# Patient Record
Sex: Male | Born: 1950 | Race: White | Hispanic: No | Marital: Single | Smoking: Never smoker
Health system: Southern US, Community
[De-identification: ages and names within clinical notes are randomized; demographics above are authoritative.]

## PROBLEM LIST (undated history)

## (undated) DIAGNOSIS — N139 Obstructive and reflux uropathy, unspecified: Secondary | ICD-10-CM

## (undated) DIAGNOSIS — N189 Chronic kidney disease, unspecified: Secondary | ICD-10-CM

## (undated) DIAGNOSIS — I4510 Unspecified right bundle-branch block: Secondary | ICD-10-CM

## (undated) DIAGNOSIS — F200 Paranoid schizophrenia: Secondary | ICD-10-CM

## (undated) DIAGNOSIS — F32A Depression, unspecified: Secondary | ICD-10-CM

## (undated) DIAGNOSIS — E785 Hyperlipidemia, unspecified: Secondary | ICD-10-CM

## (undated) DIAGNOSIS — E66813 Obesity, class 3: Secondary | ICD-10-CM

## (undated) DIAGNOSIS — F419 Anxiety disorder, unspecified: Secondary | ICD-10-CM

## (undated) DIAGNOSIS — I509 Heart failure, unspecified: Secondary | ICD-10-CM

## (undated) DIAGNOSIS — R4189 Other symptoms and signs involving cognitive functions and awareness: Secondary | ICD-10-CM

## (undated) DIAGNOSIS — A419 Sepsis, unspecified organism: Secondary | ICD-10-CM

## (undated) DIAGNOSIS — E039 Hypothyroidism, unspecified: Secondary | ICD-10-CM

## (undated) DIAGNOSIS — R600 Localized edema: Secondary | ICD-10-CM

## (undated) DIAGNOSIS — R531 Weakness: Secondary | ICD-10-CM

## (undated) DIAGNOSIS — J449 Chronic obstructive pulmonary disease, unspecified: Secondary | ICD-10-CM

## (undated) DIAGNOSIS — K219 Gastro-esophageal reflux disease without esophagitis: Secondary | ICD-10-CM

## (undated) DIAGNOSIS — E559 Vitamin D deficiency, unspecified: Secondary | ICD-10-CM

## (undated) DIAGNOSIS — C8590 Non-Hodgkin lymphoma, unspecified, unspecified site: Secondary | ICD-10-CM

## (undated) DIAGNOSIS — C801 Malignant (primary) neoplasm, unspecified: Secondary | ICD-10-CM

## (undated) DIAGNOSIS — K449 Diaphragmatic hernia without obstruction or gangrene: Secondary | ICD-10-CM

## (undated) DIAGNOSIS — C9110 Chronic lymphocytic leukemia of B-cell type not having achieved remission: Secondary | ICD-10-CM

## (undated) DIAGNOSIS — I10 Essential (primary) hypertension: Secondary | ICD-10-CM

## (undated) DIAGNOSIS — R9431 Abnormal electrocardiogram [ECG] [EKG]: Secondary | ICD-10-CM

## (undated) DIAGNOSIS — I517 Cardiomegaly: Secondary | ICD-10-CM

## (undated) DIAGNOSIS — L0390 Cellulitis, unspecified: Secondary | ICD-10-CM

## (undated) DIAGNOSIS — F259 Schizoaffective disorder, unspecified: Secondary | ICD-10-CM

## (undated) HISTORY — PX: LYMPH NODE BIOPSY: SHX201

## (undated) HISTORY — DX: Other symptoms and signs involving cognitive functions and awareness: R41.89

## (undated) HISTORY — DX: Chronic kidney disease, unspecified: N18.9

## (undated) HISTORY — DX: Non-Hodgkin lymphoma, unspecified, unspecified site: C85.90

## (undated) HISTORY — DX: Unspecified right bundle-branch block: I45.10

## (undated) HISTORY — DX: Anxiety disorder, unspecified: F41.9

## (undated) HISTORY — DX: Obstructive and reflux uropathy, unspecified: N13.9

## (undated) HISTORY — DX: Hypothyroidism, unspecified: E03.9

## (undated) HISTORY — DX: Cardiomegaly: I51.7

## (undated) HISTORY — DX: Paranoid schizophrenia: F20.0

## (undated) HISTORY — DX: Gastro-esophageal reflux disease without esophagitis: K21.9

## (undated) HISTORY — DX: Chronic lymphocytic leukemia of B-cell type not having achieved remission: C91.10

## (undated) HISTORY — DX: Diaphragmatic hernia without obstruction or gangrene: K44.9

## (undated) HISTORY — DX: Obesity, class 3: E66.813

## (undated) HISTORY — DX: Weakness: R53.1

## (undated) HISTORY — DX: Morbid (severe) obesity due to excess calories: E66.01

## (undated) HISTORY — DX: Depression, unspecified: F32.A

---

## 2001-02-03 ENCOUNTER — Encounter: Admission: RE | Admit: 2001-02-03 | Discharge: 2001-02-03 | Payer: Self-pay | Admitting: Nephrology

## 2001-02-03 ENCOUNTER — Encounter: Payer: Self-pay | Admitting: Nephrology

## 2001-04-20 ENCOUNTER — Emergency Department (HOSPITAL_COMMUNITY): Admission: EM | Admit: 2001-04-20 | Discharge: 2001-04-20 | Payer: Self-pay | Admitting: *Deleted

## 2001-05-21 ENCOUNTER — Emergency Department (HOSPITAL_COMMUNITY): Admission: EM | Admit: 2001-05-21 | Discharge: 2001-05-21 | Payer: Self-pay | Admitting: Emergency Medicine

## 2001-08-16 ENCOUNTER — Encounter: Admission: RE | Admit: 2001-08-16 | Discharge: 2001-08-16 | Payer: Self-pay | Admitting: Nephrology

## 2001-08-16 ENCOUNTER — Encounter: Payer: Self-pay | Admitting: Nephrology

## 2001-09-18 ENCOUNTER — Ambulatory Visit (HOSPITAL_BASED_OUTPATIENT_CLINIC_OR_DEPARTMENT_OTHER): Admission: RE | Admit: 2001-09-18 | Discharge: 2001-09-18 | Payer: Self-pay | Admitting: Nephrology

## 2001-12-02 ENCOUNTER — Ambulatory Visit (HOSPITAL_BASED_OUTPATIENT_CLINIC_OR_DEPARTMENT_OTHER): Admission: RE | Admit: 2001-12-02 | Discharge: 2001-12-02 | Payer: Self-pay | Admitting: Nephrology

## 2002-03-21 ENCOUNTER — Emergency Department (HOSPITAL_COMMUNITY): Admission: EM | Admit: 2002-03-21 | Discharge: 2002-03-21 | Payer: Self-pay | Admitting: Emergency Medicine

## 2002-03-21 ENCOUNTER — Encounter: Payer: Self-pay | Admitting: Emergency Medicine

## 2002-07-19 ENCOUNTER — Inpatient Hospital Stay (HOSPITAL_COMMUNITY): Admission: EM | Admit: 2002-07-19 | Discharge: 2002-07-27 | Payer: Self-pay | Admitting: Psychiatry

## 2003-02-07 ENCOUNTER — Emergency Department (HOSPITAL_COMMUNITY): Admission: EM | Admit: 2003-02-07 | Discharge: 2003-02-08 | Payer: Self-pay | Admitting: Emergency Medicine

## 2003-02-08 ENCOUNTER — Inpatient Hospital Stay (HOSPITAL_COMMUNITY): Admission: EM | Admit: 2003-02-08 | Discharge: 2003-02-16 | Payer: Self-pay | Admitting: Nephrology

## 2003-07-29 ENCOUNTER — Emergency Department (HOSPITAL_COMMUNITY): Admission: EM | Admit: 2003-07-29 | Discharge: 2003-07-30 | Payer: Self-pay | Admitting: *Deleted

## 2003-08-31 ENCOUNTER — Emergency Department (HOSPITAL_COMMUNITY): Admission: EM | Admit: 2003-08-31 | Discharge: 2003-09-01 | Payer: Self-pay | Admitting: Emergency Medicine

## 2003-10-05 ENCOUNTER — Inpatient Hospital Stay (HOSPITAL_COMMUNITY): Admission: EM | Admit: 2003-10-05 | Discharge: 2003-10-10 | Payer: Self-pay | Admitting: Emergency Medicine

## 2003-10-17 DIAGNOSIS — C9110 Chronic lymphocytic leukemia of B-cell type not having achieved remission: Secondary | ICD-10-CM

## 2003-10-17 HISTORY — DX: Chronic lymphocytic leukemia of B-cell type not having achieved remission: C91.10

## 2005-12-15 ENCOUNTER — Ambulatory Visit: Payer: Self-pay | Admitting: Oncology

## 2005-12-29 ENCOUNTER — Encounter (HOSPITAL_COMMUNITY): Payer: Self-pay | Admitting: Oncology

## 2005-12-29 ENCOUNTER — Other Ambulatory Visit: Admission: RE | Admit: 2005-12-29 | Discharge: 2005-12-29 | Payer: Self-pay | Admitting: Oncology

## 2006-03-29 ENCOUNTER — Ambulatory Visit: Payer: Self-pay | Admitting: Oncology

## 2006-04-05 LAB — CBC WITH DIFFERENTIAL/PLATELET
Basophils Absolute: 0.1 10*3/uL (ref 0.0–0.1)
EOS%: 1 % (ref 0.0–7.0)
Eosinophils Absolute: 0.2 10*3/uL (ref 0.0–0.5)
HCT: 40.4 % (ref 38.7–49.9)
LYMPH%: 61.7 % — ABNORMAL HIGH (ref 14.0–48.0)
MCV: 88.1 fL (ref 81.6–98.0)
MONO%: 3.3 % (ref 0.0–13.0)
NEUT#: 6.3 10*3/uL (ref 1.5–6.5)
NEUT%: 33.3 % — ABNORMAL LOW (ref 40.0–75.0)
Platelets: 159 10*3/uL (ref 145–400)
RBC: 4.58 10*6/uL (ref 4.20–5.71)

## 2006-06-30 ENCOUNTER — Ambulatory Visit: Payer: Self-pay | Admitting: Oncology

## 2006-07-09 ENCOUNTER — Emergency Department (HOSPITAL_COMMUNITY): Admission: EM | Admit: 2006-07-09 | Discharge: 2006-07-09 | Payer: Self-pay | Admitting: Emergency Medicine

## 2006-09-08 ENCOUNTER — Ambulatory Visit: Payer: Self-pay | Admitting: Oncology

## 2006-10-12 ENCOUNTER — Ambulatory Visit (HOSPITAL_COMMUNITY): Admission: RE | Admit: 2006-10-12 | Discharge: 2006-10-12 | Payer: Self-pay | Admitting: Oncology

## 2006-10-12 LAB — CBC WITH DIFFERENTIAL/PLATELET
EOS%: 0.8 % (ref 0.0–7.0)
MCH: 30.4 pg (ref 28.0–33.4)
MCV: 89.6 fL (ref 81.6–98.0)
MONO%: 3.2 % (ref 0.0–13.0)
RBC: 4.34 10*6/uL (ref 4.20–5.71)
RDW: 14.8 % — ABNORMAL HIGH (ref 11.2–14.6)

## 2006-10-12 LAB — COMPREHENSIVE METABOLIC PANEL
AST: 12 U/L (ref 0–37)
Albumin: 4.4 g/dL (ref 3.5–5.2)
Alkaline Phosphatase: 92 U/L (ref 39–117)
BUN: 21 mg/dL (ref 6–23)
Potassium: 4.5 mEq/L (ref 3.5–5.3)
Sodium: 143 mEq/L (ref 135–145)
Total Bilirubin: 0.5 mg/dL (ref 0.3–1.2)
Total Protein: 6.1 g/dL (ref 6.0–8.3)

## 2007-01-06 ENCOUNTER — Ambulatory Visit: Payer: Self-pay | Admitting: Oncology

## 2007-01-11 LAB — CBC WITH DIFFERENTIAL/PLATELET
BASO%: 0.4 % (ref 0.0–2.0)
EOS%: 1.4 % (ref 0.0–7.0)
HCT: 42.1 % (ref 38.7–49.9)
LYMPH%: 58 % — ABNORMAL HIGH (ref 14.0–48.0)
MCH: 29.8 pg (ref 28.0–33.4)
MCHC: 34 g/dL (ref 32.0–35.9)
MCV: 87.8 fL (ref 81.6–98.0)
MONO%: 4.5 % (ref 0.0–13.0)
NEUT%: 35.7 % — ABNORMAL LOW (ref 40.0–75.0)
Platelets: 142 10*3/uL — ABNORMAL LOW (ref 145–400)
RBC: 4.8 10*6/uL (ref 4.20–5.71)
WBC: 19.8 10*3/uL — ABNORMAL HIGH (ref 4.0–10.0)

## 2007-01-11 LAB — COMPREHENSIVE METABOLIC PANEL
ALT: 11 U/L (ref 0–53)
AST: 13 U/L (ref 0–37)
Albumin: 4.7 g/dL (ref 3.5–5.2)
Chloride: 108 mEq/L (ref 96–112)
Creatinine, Ser: 1.67 mg/dL — ABNORMAL HIGH (ref 0.40–1.50)
Total Protein: 6.8 g/dL (ref 6.0–8.3)

## 2007-01-11 LAB — LACTATE DEHYDROGENASE: LDH: 129 U/L (ref 94–250)

## 2007-01-26 ENCOUNTER — Ambulatory Visit (HOSPITAL_COMMUNITY): Admission: RE | Admit: 2007-01-26 | Discharge: 2007-01-26 | Payer: Self-pay | Admitting: Oncology

## 2007-02-10 LAB — CBC WITH DIFFERENTIAL/PLATELET
BASO%: 0.6 % (ref 0.0–2.0)
Eosinophils Absolute: 0.2 10*3/uL (ref 0.0–0.5)
MCHC: 34.4 g/dL (ref 32.0–35.9)
MONO#: 0.5 10*3/uL (ref 0.1–0.9)
NEUT#: 5.6 10*3/uL (ref 1.5–6.5)
Platelets: 133 10*3/uL — ABNORMAL LOW (ref 145–400)
RBC: 4.38 10*6/uL (ref 4.20–5.71)
WBC: 15.8 10*3/uL — ABNORMAL HIGH (ref 4.0–10.0)
lymph#: 9.4 10*3/uL — ABNORMAL HIGH (ref 0.9–3.3)

## 2007-02-10 LAB — COMPREHENSIVE METABOLIC PANEL
ALT: 9 U/L (ref 0–53)
Albumin: 4.4 g/dL (ref 3.5–5.2)
CO2: 24 mEq/L (ref 19–32)
Calcium: 9.6 mg/dL (ref 8.4–10.5)
Chloride: 110 mEq/L (ref 96–112)
Glucose, Bld: 87 mg/dL (ref 70–99)
Potassium: 4.2 mEq/L (ref 3.5–5.3)
Sodium: 141 mEq/L (ref 135–145)
Total Bilirubin: 0.5 mg/dL (ref 0.3–1.2)
Total Protein: 6.2 g/dL (ref 6.0–8.3)

## 2007-02-10 LAB — LACTATE DEHYDROGENASE: LDH: 136 U/L (ref 94–250)

## 2007-04-04 ENCOUNTER — Emergency Department (HOSPITAL_COMMUNITY): Admission: EM | Admit: 2007-04-04 | Discharge: 2007-04-04 | Payer: Self-pay | Admitting: Emergency Medicine

## 2007-04-06 ENCOUNTER — Ambulatory Visit: Payer: Self-pay | Admitting: Oncology

## 2007-04-08 LAB — CBC WITH DIFFERENTIAL/PLATELET
BASO%: 0.3 % (ref 0.0–2.0)
HCT: 35.5 % — ABNORMAL LOW (ref 38.7–49.9)
LYMPH%: 63.2 % — ABNORMAL HIGH (ref 14.0–48.0)
MCH: 29.2 pg (ref 28.0–33.4)
MCHC: 34.3 g/dL (ref 32.0–35.9)
MCV: 85.3 fL (ref 81.6–98.0)
MONO%: 3.6 % (ref 0.0–13.0)
NEUT%: 31.3 % — ABNORMAL LOW (ref 40.0–75.0)
Platelets: 132 10*3/uL — ABNORMAL LOW (ref 145–400)
RBC: 4.17 10*6/uL — ABNORMAL LOW (ref 4.20–5.71)

## 2007-04-08 LAB — LACTATE DEHYDROGENASE: LDH: 105 U/L (ref 94–250)

## 2007-04-08 LAB — COMPREHENSIVE METABOLIC PANEL
ALT: 10 U/L (ref 0–53)
Alkaline Phosphatase: 72 U/L (ref 39–117)
CO2: 24 mEq/L (ref 19–32)
Creatinine, Ser: 1.33 mg/dL (ref 0.40–1.50)
Sodium: 143 mEq/L (ref 135–145)
Total Bilirubin: 0.4 mg/dL (ref 0.3–1.2)
Total Protein: 5.7 g/dL — ABNORMAL LOW (ref 6.0–8.3)

## 2007-04-08 LAB — IRON AND TIBC
%SAT: 17 % — ABNORMAL LOW (ref 20–55)
Iron: 49 ug/dL (ref 42–165)
TIBC: 291 ug/dL (ref 215–435)

## 2007-04-08 LAB — FERRITIN: Ferritin: 31 ng/mL (ref 22–322)

## 2007-06-07 ENCOUNTER — Ambulatory Visit: Payer: Self-pay | Admitting: Oncology

## 2007-06-10 LAB — CBC WITH DIFFERENTIAL/PLATELET
BASO%: 0.3 % (ref 0.0–2.0)
EOS%: 0.9 % (ref 0.0–7.0)
HGB: 12.7 g/dL — ABNORMAL LOW (ref 13.0–17.1)
MCH: 30.1 pg (ref 28.0–33.4)
MCV: 87 fL (ref 81.6–98.0)
MONO%: 3.6 % (ref 0.0–13.0)
RBC: 4.22 10*6/uL (ref 4.20–5.71)
RDW: 16.1 % — ABNORMAL HIGH (ref 11.2–14.6)
lymph#: 12.8 10*3/uL — ABNORMAL HIGH (ref 0.9–3.3)

## 2007-06-10 LAB — COMPREHENSIVE METABOLIC PANEL
Albumin: 4.2 g/dL (ref 3.5–5.2)
BUN: 17 mg/dL (ref 6–23)
CO2: 24 mEq/L (ref 19–32)
Calcium: 9.7 mg/dL (ref 8.4–10.5)
Chloride: 111 mEq/L (ref 96–112)
Glucose, Bld: 77 mg/dL (ref 70–99)
Potassium: 4.5 mEq/L (ref 3.5–5.3)
Sodium: 142 mEq/L (ref 135–145)
Total Protein: 5.8 g/dL — ABNORMAL LOW (ref 6.0–8.3)

## 2007-06-10 LAB — LACTATE DEHYDROGENASE: LDH: 126 U/L (ref 94–250)

## 2007-09-06 ENCOUNTER — Ambulatory Visit: Payer: Self-pay | Admitting: Oncology

## 2007-09-08 LAB — CBC WITH DIFFERENTIAL/PLATELET
BASO%: 0.4 % (ref 0.0–2.0)
Eosinophils Absolute: 0.2 10*3/uL (ref 0.0–0.5)
LYMPH%: 71.2 % — ABNORMAL HIGH (ref 14.0–48.0)
MCHC: 34.5 g/dL (ref 32.0–35.9)
MONO#: 0.6 10*3/uL (ref 0.1–0.9)
NEUT#: 4.9 10*3/uL (ref 1.5–6.5)
Platelets: 120 10*3/uL — ABNORMAL LOW (ref 145–400)
RBC: 4.28 10*6/uL (ref 4.20–5.71)
RDW: 14.7 % — ABNORMAL HIGH (ref 11.2–14.6)
WBC: 20.1 10*3/uL — ABNORMAL HIGH (ref 4.0–10.0)
lymph#: 14.3 10*3/uL — ABNORMAL HIGH (ref 0.9–3.3)

## 2007-09-08 LAB — COMPREHENSIVE METABOLIC PANEL
ALT: 8 U/L (ref 0–53)
Albumin: 4.6 g/dL (ref 3.5–5.2)
CO2: 25 mEq/L (ref 19–32)
Potassium: 4.6 mEq/L (ref 3.5–5.3)
Sodium: 140 mEq/L (ref 135–145)
Total Bilirubin: 0.2 mg/dL — ABNORMAL LOW (ref 0.3–1.2)
Total Protein: 6.3 g/dL (ref 6.0–8.3)

## 2007-09-08 LAB — LACTATE DEHYDROGENASE: LDH: 132 U/L (ref 94–250)

## 2007-12-08 ENCOUNTER — Ambulatory Visit: Payer: Self-pay | Admitting: Oncology

## 2007-12-08 LAB — IGG, IGA, IGM
IgA: 42 mg/dL — ABNORMAL LOW (ref 68–378)
IgG (Immunoglobin G), Serum: 464 mg/dL — ABNORMAL LOW (ref 694–1618)
IgM, Serum: <4 mg/dL — ABNORMAL LOW (ref 60–263)

## 2007-12-08 LAB — COMPREHENSIVE METABOLIC PANEL
ALT: 14 U/L (ref 0–53)
BUN: 17 mg/dL (ref 6–23)
CO2: 21 mEq/L (ref 19–32)
Calcium: 9.1 mg/dL (ref 8.4–10.5)
Chloride: 108 mEq/L (ref 96–112)
Creatinine, Ser: 1.54 mg/dL — ABNORMAL HIGH (ref 0.40–1.50)
Total Bilirubin: 0.5 mg/dL (ref 0.3–1.2)

## 2007-12-08 LAB — CBC WITH DIFFERENTIAL/PLATELET
BASO%: 0.3 % (ref 0.0–2.0)
Basophils Absolute: 0 10*3/uL (ref 0.0–0.1)
EOS%: 0.7 % (ref 0.0–7.0)
HCT: 35.9 % — ABNORMAL LOW (ref 38.7–49.9)
HGB: 12.3 g/dL — ABNORMAL LOW (ref 13.0–17.1)
LYMPH%: 72.3 % — ABNORMAL HIGH (ref 14.0–48.0)
MCH: 29.8 pg (ref 28.0–33.4)
MCHC: 34.3 g/dL (ref 32.0–35.9)
MONO#: 0.6 10*3/uL (ref 0.1–0.9)
NEUT%: 23 % — ABNORMAL LOW (ref 40.0–75.0)
Platelets: 107 10*3/uL — ABNORMAL LOW (ref 145–400)
lymph#: 11.9 10*3/uL — ABNORMAL HIGH (ref 0.9–3.3)

## 2007-12-08 LAB — LACTATE DEHYDROGENASE: LDH: 158 U/L (ref 94–250)

## 2008-04-04 ENCOUNTER — Ambulatory Visit: Payer: Self-pay | Admitting: Oncology

## 2008-08-09 ENCOUNTER — Ambulatory Visit: Payer: Self-pay | Admitting: Oncology

## 2008-08-13 LAB — CBC WITH DIFFERENTIAL/PLATELET
BASO%: 0.2 % (ref 0.0–2.0)
Basophils Absolute: 0.1 10*3/uL (ref 0.0–0.1)
EOS%: 0.7 % (ref 0.0–7.0)
HCT: 34.1 % — ABNORMAL LOW (ref 38.7–49.9)
LYMPH%: 72.1 % — ABNORMAL HIGH (ref 14.0–48.0)
MCH: 27.9 pg — ABNORMAL LOW (ref 28.0–33.4)
MCHC: 33.7 g/dL (ref 32.0–35.9)
MCV: 82.7 fL (ref 81.6–98.0)
MONO%: 4.2 % (ref 0.0–13.0)
NEUT%: 22.8 % — ABNORMAL LOW (ref 40.0–75.0)
Platelets: 116 10*3/uL — ABNORMAL LOW (ref 145–400)
lymph#: 16.4 10*3/uL — ABNORMAL HIGH (ref 0.9–3.3)

## 2008-08-13 LAB — COMPREHENSIVE METABOLIC PANEL
ALT: <8 U/L (ref 0–53)
AST: 16 U/L (ref 0–37)
Alkaline Phosphatase: 103 U/L (ref 39–117)
BUN: 15 mg/dL (ref 6–23)
Creatinine, Ser: 1.42 mg/dL (ref 0.40–1.50)
Total Bilirubin: 0.4 mg/dL (ref 0.3–1.2)

## 2008-08-16 DIAGNOSIS — C8590 Non-Hodgkin lymphoma, unspecified, unspecified site: Secondary | ICD-10-CM

## 2008-08-16 HISTORY — DX: Non-Hodgkin lymphoma, unspecified, unspecified site: C85.90

## 2008-08-17 ENCOUNTER — Emergency Department (HOSPITAL_COMMUNITY): Admission: EM | Admit: 2008-08-17 | Discharge: 2008-08-17 | Payer: Self-pay | Admitting: Emergency Medicine

## 2008-08-20 LAB — CBC WITH DIFFERENTIAL/PLATELET
Basophils Absolute: 0 10*3/uL (ref 0.0–0.1)
EOS%: 1 % (ref 0.0–7.0)
Eosinophils Absolute: 0.2 10*3/uL (ref 0.0–0.5)
HGB: 11.4 g/dL — ABNORMAL LOW (ref 13.0–17.1)
MCH: 27.6 pg — ABNORMAL LOW (ref 28.0–33.4)
MONO#: 1 10*3/uL — ABNORMAL HIGH (ref 0.1–0.9)
NEUT#: 6.4 10*3/uL (ref 1.5–6.5)
RDW: 16.1 % — ABNORMAL HIGH (ref 11.2–14.6)
WBC: 18.3 10*3/uL — ABNORMAL HIGH (ref 4.0–10.0)
lymph#: 10.7 10*3/uL — ABNORMAL HIGH (ref 0.9–3.3)

## 2008-08-20 LAB — COMPREHENSIVE METABOLIC PANEL
AST: 11 U/L (ref 0–37)
Albumin: 3.8 g/dL (ref 3.5–5.2)
BUN: 18 mg/dL (ref 6–23)
CO2: 24 mEq/L (ref 19–32)
Calcium: 9.3 mg/dL (ref 8.4–10.5)
Chloride: 104 mEq/L (ref 96–112)
Potassium: 3.9 mEq/L (ref 3.5–5.3)

## 2008-08-21 ENCOUNTER — Ambulatory Visit: Payer: Self-pay | Admitting: Oncology

## 2008-08-21 ENCOUNTER — Ambulatory Visit: Payer: Self-pay | Admitting: Cardiology

## 2008-08-21 ENCOUNTER — Inpatient Hospital Stay (HOSPITAL_COMMUNITY): Admission: EM | Admit: 2008-08-21 | Discharge: 2008-08-25 | Payer: Self-pay | Admitting: Emergency Medicine

## 2008-08-22 ENCOUNTER — Encounter (INDEPENDENT_AMBULATORY_CARE_PROVIDER_SITE_OTHER): Payer: Self-pay | Admitting: Emergency Medicine

## 2008-08-22 ENCOUNTER — Encounter (INDEPENDENT_AMBULATORY_CARE_PROVIDER_SITE_OTHER): Payer: Self-pay | Admitting: Internal Medicine

## 2008-08-22 ENCOUNTER — Encounter (INDEPENDENT_AMBULATORY_CARE_PROVIDER_SITE_OTHER): Payer: Self-pay | Admitting: Interventional Radiology

## 2008-08-30 LAB — CBC WITH DIFFERENTIAL/PLATELET
Basophils Absolute: 0 10*3/uL (ref 0.0–0.1)
Eosinophils Absolute: 0.1 10*3/uL (ref 0.0–0.5)
HCT: 35.5 % — ABNORMAL LOW (ref 38.7–49.9)
HGB: 11.9 g/dL — ABNORMAL LOW (ref 13.0–17.1)
LYMPH%: 46.2 % (ref 14.0–48.0)
MCV: 83.6 fL (ref 81.6–98.0)
MONO#: 0.2 10*3/uL (ref 0.1–0.9)
MONO%: 1.4 % (ref 0.0–13.0)
NEUT#: 9 10*3/uL — ABNORMAL HIGH (ref 1.5–6.5)
Platelets: 227 10*3/uL (ref 145–400)
RBC: 4.25 10*6/uL (ref 4.20–5.71)
WBC: 17.4 10*3/uL — ABNORMAL HIGH (ref 4.0–10.0)

## 2008-09-06 LAB — CBC WITH DIFFERENTIAL/PLATELET
BASO%: 0.2 % (ref 0.0–2.0)
Eosinophils Absolute: 0.1 10*3/uL (ref 0.0–0.5)
HCT: 35 % — ABNORMAL LOW (ref 38.7–49.9)
LYMPH%: 17.1 % (ref 14.0–48.0)
MCHC: 34.2 g/dL (ref 32.0–35.9)
MCV: 84.2 fL (ref 81.6–98.0)
MONO#: 0.3 10*3/uL (ref 0.1–0.9)
MONO%: 2.7 % (ref 0.0–13.0)
NEUT%: 79.2 % — ABNORMAL HIGH (ref 40.0–75.0)
Platelets: 103 10*3/uL — ABNORMAL LOW (ref 145–400)
WBC: 10.9 10*3/uL — ABNORMAL HIGH (ref 4.0–10.0)

## 2008-09-10 LAB — CBC WITH DIFFERENTIAL/PLATELET
BASO%: 0.4 % (ref 0.0–2.0)
EOS%: 0.3 % (ref 0.0–7.0)
HCT: 36.6 % — ABNORMAL LOW (ref 38.7–49.9)
LYMPH%: 23.2 % (ref 14.0–48.0)
MCH: 28.3 pg (ref 28.0–33.4)
MCHC: 33.6 g/dL (ref 32.0–35.9)
MONO%: 2.1 % (ref 0.0–13.0)
NEUT%: 74 % (ref 40.0–75.0)
Platelets: 92 10*3/uL — ABNORMAL LOW (ref 145–400)
RBC: 4.34 10*6/uL (ref 4.20–5.71)

## 2008-09-10 LAB — URIC ACID: Uric Acid, Serum: 6.5 mg/dL (ref 4.0–7.8)

## 2008-09-13 ENCOUNTER — Emergency Department (HOSPITAL_COMMUNITY): Admission: EM | Admit: 2008-09-13 | Discharge: 2008-09-14 | Payer: Self-pay | Admitting: Emergency Medicine

## 2008-09-18 LAB — COMPREHENSIVE METABOLIC PANEL
Albumin: 4.3 g/dL (ref 3.5–5.2)
CO2: 20 mEq/L (ref 19–32)
Calcium: 9.8 mg/dL (ref 8.4–10.5)
Glucose, Bld: 94 mg/dL (ref 70–99)
Potassium: 4 mEq/L (ref 3.5–5.3)
Sodium: 137 mEq/L (ref 135–145)
Total Protein: 6 g/dL (ref 6.0–8.3)

## 2008-09-18 LAB — CBC WITH DIFFERENTIAL/PLATELET
Basophils Absolute: 0.1 10*3/uL (ref 0.0–0.1)
Eosinophils Absolute: 0.1 10*3/uL (ref 0.0–0.5)
HGB: 12.2 g/dL — ABNORMAL LOW (ref 13.0–17.1)
MONO#: 0.6 10*3/uL (ref 0.1–0.9)
NEUT#: 8.5 10*3/uL — ABNORMAL HIGH (ref 1.5–6.5)
Platelets: 137 10*3/uL — ABNORMAL LOW (ref 145–400)
RBC: 4.18 10*6/uL — ABNORMAL LOW (ref 4.20–5.71)
RDW: 23 % — ABNORMAL HIGH (ref 11.2–14.6)
WBC: 12.5 10*3/uL — ABNORMAL HIGH (ref 4.0–10.0)

## 2008-09-18 LAB — LACTATE DEHYDROGENASE: LDH: 181 U/L (ref 94–250)

## 2008-09-20 ENCOUNTER — Ambulatory Visit (HOSPITAL_BASED_OUTPATIENT_CLINIC_OR_DEPARTMENT_OTHER): Admission: RE | Admit: 2008-09-20 | Discharge: 2008-09-20 | Payer: Self-pay | Admitting: General Surgery

## 2008-09-20 ENCOUNTER — Encounter (INDEPENDENT_AMBULATORY_CARE_PROVIDER_SITE_OTHER): Payer: Self-pay | Admitting: General Surgery

## 2008-09-20 ENCOUNTER — Encounter (INDEPENDENT_AMBULATORY_CARE_PROVIDER_SITE_OTHER): Payer: Self-pay | Admitting: Interventional Radiology

## 2008-09-20 DIAGNOSIS — N189 Chronic kidney disease, unspecified: Secondary | ICD-10-CM

## 2008-09-20 HISTORY — DX: Chronic kidney disease, unspecified: N18.9

## 2008-09-28 ENCOUNTER — Ambulatory Visit: Payer: Self-pay | Admitting: Oncology

## 2008-10-02 ENCOUNTER — Ambulatory Visit: Payer: Self-pay | Admitting: Vascular Surgery

## 2008-10-02 ENCOUNTER — Encounter (HOSPITAL_COMMUNITY): Payer: Self-pay | Admitting: Oncology

## 2008-10-02 ENCOUNTER — Ambulatory Visit: Admission: RE | Admit: 2008-10-02 | Discharge: 2008-10-02 | Payer: Self-pay | Admitting: Oncology

## 2008-10-02 LAB — COMPREHENSIVE METABOLIC PANEL
BUN: 21 mg/dL (ref 6–23)
CO2: 22 mEq/L (ref 19–32)
Glucose, Bld: 92 mg/dL (ref 70–99)
Sodium: 138 mEq/L (ref 135–145)
Total Bilirubin: 0.3 mg/dL (ref 0.3–1.2)
Total Protein: 5.4 g/dL — ABNORMAL LOW (ref 6.0–8.3)

## 2008-10-02 LAB — CBC WITH DIFFERENTIAL/PLATELET
Basophils Absolute: 0.1 10*3/uL (ref 0.0–0.1)
Eosinophils Absolute: 0.7 10*3/uL — ABNORMAL HIGH (ref 0.0–0.5)
HCT: 36.1 % — ABNORMAL LOW (ref 38.7–49.9)
HGB: 12.2 g/dL — ABNORMAL LOW (ref 13.0–17.1)
LYMPH%: 24.8 % (ref 14.0–48.0)
MCV: 84.7 fL (ref 81.6–98.0)
MONO#: 0.6 10*3/uL (ref 0.1–0.9)
NEUT#: 8 10*3/uL — ABNORMAL HIGH (ref 1.5–6.5)
NEUT%: 63.8 % (ref 40.0–75.0)
Platelets: 132 10*3/uL — ABNORMAL LOW (ref 145–400)
RBC: 4.26 10*6/uL (ref 4.20–5.71)
WBC: 12.6 10*3/uL — ABNORMAL HIGH (ref 4.0–10.0)

## 2008-10-02 LAB — URIC ACID: Uric Acid, Serum: 7.7 mg/dL (ref 4.0–7.8)

## 2008-10-02 LAB — LACTATE DEHYDROGENASE: LDH: 154 U/L (ref 94–250)

## 2008-10-04 ENCOUNTER — Ambulatory Visit (HOSPITAL_COMMUNITY): Admission: RE | Admit: 2008-10-04 | Discharge: 2008-10-04 | Payer: Self-pay | Admitting: Oncology

## 2008-10-04 ENCOUNTER — Encounter (HOSPITAL_COMMUNITY): Payer: Self-pay | Admitting: Oncology

## 2008-10-09 LAB — COMPREHENSIVE METABOLIC PANEL
Alkaline Phosphatase: 67 U/L (ref 39–117)
BUN: 20 mg/dL (ref 6–23)
CO2: 23 mEq/L (ref 19–32)
Creatinine, Ser: 1.22 mg/dL (ref 0.40–1.50)
Glucose, Bld: 80 mg/dL (ref 70–99)
Total Bilirubin: 0.5 mg/dL (ref 0.3–1.2)

## 2008-10-09 LAB — URIC ACID: Uric Acid, Serum: 8.4 mg/dL — ABNORMAL HIGH (ref 4.0–7.8)

## 2008-10-09 LAB — CBC WITH DIFFERENTIAL/PLATELET
BASO%: 1.4 % (ref 0.0–2.0)
Basophils Absolute: 0.1 10*3/uL (ref 0.0–0.1)
EOS%: 3 % (ref 0.0–7.0)
HGB: 11.9 g/dL — ABNORMAL LOW (ref 13.0–17.1)
MCH: 29.1 pg (ref 28.0–33.4)
MCHC: 33.8 g/dL (ref 32.0–35.9)
RDW: 19 % — ABNORMAL HIGH (ref 11.2–14.6)
WBC: 9.2 10*3/uL (ref 4.0–10.0)
lymph#: 4.6 10*3/uL — ABNORMAL HIGH (ref 0.9–3.3)

## 2008-10-09 LAB — LACTATE DEHYDROGENASE: LDH: 122 U/L (ref 94–250)

## 2008-10-16 LAB — CBC WITH DIFFERENTIAL/PLATELET
BASO%: 0.7 % (ref 0.0–2.0)
EOS%: 2.7 % (ref 0.0–7.0)
HCT: 35.2 % — ABNORMAL LOW (ref 38.7–49.9)
LYMPH%: 19.4 % (ref 14.0–48.0)
MCH: 28.9 pg (ref 28.0–33.4)
MCHC: 33.6 g/dL (ref 32.0–35.9)
MCV: 86 fL (ref 81.6–98.0)
MONO%: 13.1 % — ABNORMAL HIGH (ref 0.0–13.0)
NEUT%: 64.1 % (ref 40.0–75.0)
Platelets: 125 10*3/uL — ABNORMAL LOW (ref 145–400)
RBC: 4.09 10*6/uL — ABNORMAL LOW (ref 4.20–5.71)
WBC: 8.1 10*3/uL (ref 4.0–10.0)

## 2008-10-16 LAB — COMPREHENSIVE METABOLIC PANEL
ALT: <8 U/L (ref 0–53)
Alkaline Phosphatase: 48 U/L (ref 39–117)
Creatinine, Ser: 1.17 mg/dL (ref 0.40–1.50)
Sodium: 141 mEq/L (ref 135–145)
Total Bilirubin: 0.5 mg/dL (ref 0.3–1.2)
Total Protein: 5.3 g/dL — ABNORMAL LOW (ref 6.0–8.3)

## 2008-10-16 LAB — LACTATE DEHYDROGENASE: LDH: 174 U/L (ref 94–250)

## 2008-10-23 LAB — CBC WITH DIFFERENTIAL/PLATELET
Basophils Absolute: 0 10*3/uL (ref 0.0–0.1)
EOS%: 2.4 % (ref 0.0–7.0)
Eosinophils Absolute: 0.1 10*3/uL (ref 0.0–0.5)
LYMPH%: 30.8 % (ref 14.0–48.0)
MCH: 29.4 pg (ref 28.0–33.4)
MCV: 86.4 fL (ref 81.6–98.0)
MONO%: 5 % (ref 0.0–13.0)
NEUT#: 3.1 10*3/uL (ref 1.5–6.5)
Platelets: 120 10*3/uL — ABNORMAL LOW (ref 145–400)
RBC: 3.67 10*6/uL — ABNORMAL LOW (ref 4.20–5.71)

## 2008-10-31 LAB — COMPREHENSIVE METABOLIC PANEL
ALT: 13 U/L (ref 0–53)
AST: 13 U/L (ref 0–37)
Alkaline Phosphatase: 75 U/L (ref 39–117)
Sodium: 139 mEq/L (ref 135–145)
Total Bilirubin: 0.4 mg/dL (ref 0.3–1.2)
Total Protein: 6.3 g/dL (ref 6.0–8.3)

## 2008-10-31 LAB — CBC WITH DIFFERENTIAL/PLATELET
BASO%: 0.9 % (ref 0.0–2.0)
LYMPH%: 13.5 % — ABNORMAL LOW (ref 14.0–48.0)
MCHC: 35 g/dL (ref 32.0–35.9)
MCV: 82.9 fL (ref 81.6–98.0)
MONO%: 4.7 % (ref 0.0–13.0)
Platelets: 128 10*3/uL — ABNORMAL LOW (ref 145–400)
RBC: 4.11 10*6/uL — ABNORMAL LOW (ref 4.20–5.71)
RDW: 16.8 % — ABNORMAL HIGH (ref 11.2–14.6)
WBC: 7.7 10*3/uL (ref 4.0–10.0)

## 2008-10-31 LAB — URIC ACID: Uric Acid, Serum: 7.4 mg/dL (ref 4.0–7.8)

## 2008-11-06 LAB — CBC WITH DIFFERENTIAL/PLATELET
EOS%: 0.6 % (ref 0.0–7.0)
Eosinophils Absolute: 0 10*3/uL (ref 0.0–0.5)
MCV: 85.8 fL (ref 81.6–98.0)
MONO%: 3 % (ref 0.0–13.0)
NEUT#: 5.8 10*3/uL (ref 1.5–6.5)
RBC: 3.47 10*6/uL — ABNORMAL LOW (ref 4.20–5.71)
RDW: 18.3 % — ABNORMAL HIGH (ref 11.2–14.6)
WBC: 6.9 10*3/uL (ref 4.0–10.0)
lymph#: 0.8 10*3/uL — ABNORMAL LOW (ref 0.9–3.3)

## 2008-11-14 ENCOUNTER — Ambulatory Visit: Payer: Self-pay | Admitting: Oncology

## 2008-11-19 LAB — COMPREHENSIVE METABOLIC PANEL
ALT: 9 U/L (ref 0–53)
AST: 11 U/L (ref 0–37)
Alkaline Phosphatase: 68 U/L (ref 39–117)
Creatinine, Ser: 1.16 mg/dL (ref 0.40–1.50)
Sodium: 139 mEq/L (ref 135–145)
Total Bilirubin: 0.3 mg/dL (ref 0.3–1.2)
Total Protein: 6 g/dL (ref 6.0–8.3)

## 2008-11-19 LAB — CBC WITH DIFFERENTIAL/PLATELET
BASO%: 0.3 % (ref 0.0–2.0)
EOS%: 0.7 % (ref 0.0–7.0)
HCT: 31.5 % — ABNORMAL LOW (ref 38.7–49.9)
LYMPH%: 9.7 % — ABNORMAL LOW (ref 14.0–48.0)
MCH: 28.9 pg (ref 28.0–33.4)
MCHC: 34.6 g/dL (ref 32.0–35.9)
MONO%: 4.3 % (ref 0.0–13.0)
NEUT%: 85 % — ABNORMAL HIGH (ref 40.0–75.0)
Platelets: 124 10*3/uL — ABNORMAL LOW (ref 145–400)
RBC: 3.77 10*6/uL — ABNORMAL LOW (ref 4.20–5.71)
WBC: 7.5 10*3/uL (ref 4.0–10.0)

## 2008-11-19 LAB — LACTATE DEHYDROGENASE: LDH: 182 U/L (ref 94–250)

## 2008-11-26 LAB — CBC WITH DIFFERENTIAL/PLATELET
Basophils Absolute: 0 10*3/uL (ref 0.0–0.1)
Eosinophils Absolute: 0.2 10*3/uL (ref 0.0–0.5)
HGB: 9.8 g/dL — ABNORMAL LOW (ref 13.0–17.1)
MCV: 83.7 fL (ref 81.6–98.0)
MONO#: 0.2 10*3/uL (ref 0.1–0.9)
MONO%: 7.6 % (ref 0.0–13.0)
NEUT#: 2.2 10*3/uL (ref 1.5–6.5)
Platelets: 128 10*3/uL — ABNORMAL LOW (ref 145–400)
RDW: 17.5 % — ABNORMAL HIGH (ref 11.2–14.6)

## 2008-12-10 ENCOUNTER — Ambulatory Visit (HOSPITAL_COMMUNITY): Admission: RE | Admit: 2008-12-10 | Discharge: 2008-12-10 | Payer: Self-pay | Admitting: Gastroenterology

## 2008-12-10 LAB — CBC WITH DIFFERENTIAL/PLATELET
Basophils Absolute: 0 10*3/uL (ref 0.0–0.1)
Eosinophils Absolute: 0.1 10*3/uL (ref 0.0–0.5)
HCT: 29.5 % — ABNORMAL LOW (ref 38.7–49.9)
LYMPH%: 15.4 % (ref 14.0–48.0)
MCV: 83.3 fL (ref 81.6–98.0)
MONO%: 7.7 % (ref 0.0–13.0)
NEUT#: 1.7 10*3/uL (ref 1.5–6.5)
NEUT%: 73.7 % (ref 40.0–75.0)
Platelets: 143 10*3/uL — ABNORMAL LOW (ref 145–400)
RBC: 3.54 10*6/uL — ABNORMAL LOW (ref 4.20–5.71)

## 2008-12-17 LAB — CBC WITH DIFFERENTIAL/PLATELET
BASO%: 0.7 % (ref 0.0–2.0)
Basophils Absolute: 0 10*3/uL (ref 0.0–0.1)
EOS%: 0.8 % (ref 0.0–7.0)
HCT: 30.6 % — ABNORMAL LOW (ref 38.7–49.9)
HGB: 10.6 g/dL — ABNORMAL LOW (ref 13.0–17.1)
MCH: 29.1 pg (ref 28.0–33.4)
MCHC: 34.6 g/dL (ref 32.0–35.9)
MCV: 84.1 fL (ref 81.6–98.0)
MONO%: 8.4 % (ref 0.0–13.0)
NEUT%: 83.5 % — ABNORMAL HIGH (ref 40.0–75.0)
RDW: 18.7 % — ABNORMAL HIGH (ref 11.2–14.6)
lymph#: 0.4 10*3/uL — ABNORMAL LOW (ref 0.9–3.3)

## 2008-12-18 ENCOUNTER — Ambulatory Visit: Payer: Self-pay | Admitting: Oncology

## 2008-12-18 ENCOUNTER — Inpatient Hospital Stay (HOSPITAL_COMMUNITY): Admission: EM | Admit: 2008-12-18 | Discharge: 2008-12-25 | Payer: Self-pay | Admitting: Emergency Medicine

## 2008-12-24 ENCOUNTER — Ambulatory Visit: Payer: Self-pay | Admitting: Oncology

## 2008-12-25 ENCOUNTER — Ambulatory Visit: Payer: Self-pay | Admitting: Psychiatry

## 2008-12-25 ENCOUNTER — Inpatient Hospital Stay (HOSPITAL_COMMUNITY): Admission: RE | Admit: 2008-12-25 | Discharge: 2008-12-28 | Payer: Self-pay | Admitting: Psychiatry

## 2009-01-03 ENCOUNTER — Ambulatory Visit (HOSPITAL_COMMUNITY): Admission: RE | Admit: 2009-01-03 | Discharge: 2009-01-03 | Payer: Self-pay | Admitting: Oncology

## 2009-01-03 LAB — COMPREHENSIVE METABOLIC PANEL
AST: 11 U/L (ref 0–37)
Albumin: 4.7 g/dL (ref 3.5–5.2)
Alkaline Phosphatase: 57 U/L (ref 39–117)
Potassium: 4.3 mEq/L (ref 3.5–5.3)
Sodium: 140 mEq/L (ref 135–145)
Total Bilirubin: 0.5 mg/dL (ref 0.3–1.2)
Total Protein: 6.3 g/dL (ref 6.0–8.3)

## 2009-01-03 LAB — CBC WITH DIFFERENTIAL/PLATELET
Basophils Absolute: 0.1 10*3/uL (ref 0.0–0.1)
Eosinophils Absolute: 0.9 10*3/uL — ABNORMAL HIGH (ref 0.0–0.5)
HGB: 11.9 g/dL — ABNORMAL LOW (ref 13.0–17.1)
LYMPH%: 7.8 % — ABNORMAL LOW (ref 14.0–48.0)
MCH: 28.7 pg (ref 28.0–33.4)
MCV: 84.4 fL (ref 81.6–98.0)
MONO#: 0.5 10*3/uL (ref 0.1–0.9)
NEUT%: 75 % (ref 40.0–75.0)

## 2009-01-11 LAB — CBC WITH DIFFERENTIAL/PLATELET
Eosinophils Absolute: 0.9 10*3/uL — ABNORMAL HIGH (ref 0.0–0.5)
HCT: 31.1 % — ABNORMAL LOW (ref 38.4–49.9)
LYMPH%: 20.1 % (ref 14.0–49.0)
MCHC: 34.5 g/dL (ref 32.0–36.0)
MCV: 84.2 fL (ref 79.3–98.0)
MONO#: 0.2 10*3/uL (ref 0.1–0.9)
MONO%: 9.2 % (ref 0.0–14.0)
NEUT#: 0.7 10*3/uL — ABNORMAL LOW (ref 1.5–6.5)
NEUT%: 32.3 % — ABNORMAL LOW (ref 39.0–75.0)
Platelets: 104 10*3/uL — ABNORMAL LOW (ref 140–400)
RBC: 3.7 10*6/uL — ABNORMAL LOW (ref 4.20–5.82)
WBC: 2.3 10*3/uL — ABNORMAL LOW (ref 4.0–10.3)

## 2009-01-17 LAB — CBC WITH DIFFERENTIAL/PLATELET
BASO%: 0.1 % (ref 0.0–2.0)
LYMPH%: 4.3 % — ABNORMAL LOW (ref 14.0–49.0)
MCHC: 34.5 g/dL (ref 32.0–36.0)
MCV: 85 fL (ref 79.3–98.0)
MONO#: 0.5 10*3/uL (ref 0.1–0.9)
MONO%: 4.9 % (ref 0.0–14.0)
Platelets: 89 10*3/uL — ABNORMAL LOW (ref 140–400)
RBC: 3.82 10*6/uL — ABNORMAL LOW (ref 4.20–5.82)
WBC: 10.8 10*3/uL — ABNORMAL HIGH (ref 4.0–10.3)

## 2009-01-21 LAB — CBC WITH DIFFERENTIAL/PLATELET
BASO%: 0.5 % (ref 0.0–2.0)
Eosinophils Absolute: 0.1 10*3/uL (ref 0.0–0.5)
HCT: 35 % — ABNORMAL LOW (ref 38.4–49.9)
MCHC: 34 g/dL (ref 32.0–36.0)
MONO#: 0.4 10*3/uL (ref 0.1–0.9)
NEUT#: 10.8 10*3/uL — ABNORMAL HIGH (ref 1.5–6.5)
NEUT%: 90.4 % — ABNORMAL HIGH (ref 39.0–75.0)
WBC: 12 10*3/uL — ABNORMAL HIGH (ref 4.0–10.3)
lymph#: 0.6 10*3/uL — ABNORMAL LOW (ref 0.9–3.3)
nRBC: 0 % (ref 0–0)

## 2009-01-21 LAB — COMPREHENSIVE METABOLIC PANEL
Albumin: 4.7 g/dL (ref 3.5–5.2)
Alkaline Phosphatase: 73 U/L (ref 39–117)
BUN: 16 mg/dL (ref 6–23)
Creatinine, Ser: 1.21 mg/dL (ref 0.40–1.50)
Glucose, Bld: 95 mg/dL (ref 70–99)
Total Bilirubin: 0.4 mg/dL (ref 0.3–1.2)

## 2009-01-21 LAB — URIC ACID: Uric Acid, Serum: 5.1 mg/dL (ref 4.0–7.8)

## 2009-02-05 ENCOUNTER — Ambulatory Visit: Payer: Self-pay | Admitting: Oncology

## 2009-02-07 LAB — CBC WITH DIFFERENTIAL/PLATELET
Basophils Absolute: 0.1 10*3/uL (ref 0.0–0.1)
Eosinophils Absolute: 0 10*3/uL (ref 0.0–0.5)
HGB: 11.7 g/dL — ABNORMAL LOW (ref 13.0–17.1)
MCV: 85.5 fL (ref 79.3–98.0)
NEUT#: 11.6 10*3/uL — ABNORMAL HIGH (ref 1.5–6.5)
RDW: 18.7 % — ABNORMAL HIGH (ref 11.0–14.6)
lymph#: 0.6 10*3/uL — ABNORMAL LOW (ref 0.9–3.3)

## 2009-02-07 LAB — COMPREHENSIVE METABOLIC PANEL
Albumin: 4.9 g/dL (ref 3.5–5.2)
CO2: 27 mEq/L (ref 19–32)
Glucose, Bld: 102 mg/dL — ABNORMAL HIGH (ref 70–99)
Potassium: 4.5 mEq/L (ref 3.5–5.3)
Sodium: 141 mEq/L (ref 135–145)
Total Protein: 7 g/dL (ref 6.0–8.3)

## 2009-02-07 LAB — URIC ACID: Uric Acid, Serum: 5.7 mg/dL (ref 4.0–7.8)

## 2009-02-07 LAB — LACTATE DEHYDROGENASE: LDH: 322 U/L — ABNORMAL HIGH (ref 94–250)

## 2009-02-27 ENCOUNTER — Ambulatory Visit: Payer: Self-pay | Admitting: Psychiatry

## 2009-03-05 LAB — CBC WITH DIFFERENTIAL/PLATELET
Basophils Absolute: 0 10*3/uL (ref 0.0–0.1)
EOS%: 1.5 % (ref 0.0–7.0)
Eosinophils Absolute: 0.1 10*3/uL (ref 0.0–0.5)
HCT: 31.5 % — ABNORMAL LOW (ref 38.4–49.9)
HGB: 10.8 g/dL — ABNORMAL LOW (ref 13.0–17.1)
MCH: 30.1 pg (ref 27.2–33.4)
MONO#: 0.6 10*3/uL (ref 0.1–0.9)
NEUT#: 7.6 10*3/uL — ABNORMAL HIGH (ref 1.5–6.5)
RDW: 21 % — ABNORMAL HIGH (ref 11.0–14.6)
WBC: 8.9 10*3/uL (ref 4.0–10.3)
lymph#: 0.5 10*3/uL — ABNORMAL LOW (ref 0.9–3.3)

## 2009-03-05 LAB — COMPREHENSIVE METABOLIC PANEL
AST: 15 U/L (ref 0–37)
Albumin: 4.3 g/dL (ref 3.5–5.2)
BUN: 18 mg/dL (ref 6–23)
CO2: 26 mEq/L (ref 19–32)
Calcium: 9.6 mg/dL (ref 8.4–10.5)
Chloride: 103 mEq/L (ref 96–112)
Potassium: 4.1 mEq/L (ref 3.5–5.3)

## 2009-03-05 LAB — LACTATE DEHYDROGENASE: LDH: 180 U/L (ref 94–250)

## 2009-03-17 ENCOUNTER — Other Ambulatory Visit: Payer: Self-pay | Admitting: Emergency Medicine

## 2009-03-17 ENCOUNTER — Ambulatory Visit: Payer: Self-pay | Admitting: Vascular Surgery

## 2009-03-17 ENCOUNTER — Encounter (INDEPENDENT_AMBULATORY_CARE_PROVIDER_SITE_OTHER): Payer: Self-pay | Admitting: Emergency Medicine

## 2009-03-18 ENCOUNTER — Ambulatory Visit: Payer: Self-pay | Admitting: Psychiatry

## 2009-03-18 ENCOUNTER — Inpatient Hospital Stay (HOSPITAL_COMMUNITY): Admission: RE | Admit: 2009-03-18 | Discharge: 2009-03-26 | Payer: Self-pay | Admitting: Psychiatry

## 2009-04-11 ENCOUNTER — Inpatient Hospital Stay (HOSPITAL_COMMUNITY): Admission: EM | Admit: 2009-04-11 | Discharge: 2009-04-22 | Payer: Self-pay | Admitting: Emergency Medicine

## 2009-04-12 ENCOUNTER — Ambulatory Visit: Payer: Self-pay | Admitting: Oncology

## 2009-04-13 ENCOUNTER — Ambulatory Visit: Payer: Self-pay | Admitting: *Deleted

## 2009-04-13 ENCOUNTER — Encounter (INDEPENDENT_AMBULATORY_CARE_PROVIDER_SITE_OTHER): Payer: Self-pay | Admitting: Internal Medicine

## 2009-04-19 ENCOUNTER — Encounter (INDEPENDENT_AMBULATORY_CARE_PROVIDER_SITE_OTHER): Payer: Self-pay | Admitting: Internal Medicine

## 2009-04-19 ENCOUNTER — Ambulatory Visit: Payer: Self-pay | Admitting: Vascular Surgery

## 2009-05-07 ENCOUNTER — Ambulatory Visit: Payer: Self-pay | Admitting: Oncology

## 2009-05-09 LAB — COMPREHENSIVE METABOLIC PANEL
Alkaline Phosphatase: 83 U/L (ref 39–117)
BUN: 23 mg/dL (ref 6–23)
Glucose, Bld: 84 mg/dL (ref 70–99)
Sodium: 139 mEq/L (ref 135–145)
Total Bilirubin: 0.3 mg/dL (ref 0.3–1.2)
Total Protein: 6.3 g/dL (ref 6.0–8.3)

## 2009-05-09 LAB — CBC WITH DIFFERENTIAL/PLATELET
Eosinophils Absolute: 0.5 10*3/uL (ref 0.0–0.5)
LYMPH%: 13.3 % — ABNORMAL LOW (ref 14.0–49.0)
MCH: 28.3 pg (ref 27.2–33.4)
MCV: 88.1 fL (ref 79.3–98.0)
MONO%: 12.7 % (ref 0.0–14.0)
NEUT#: 3.1 10*3/uL (ref 1.5–6.5)
Platelets: 118 10*3/uL — ABNORMAL LOW (ref 140–400)
RBC: 4.38 10*6/uL (ref 4.20–5.82)

## 2009-05-09 LAB — URIC ACID: Uric Acid, Serum: 6.7 mg/dL (ref 4.0–7.8)

## 2009-07-04 ENCOUNTER — Ambulatory Visit: Payer: Self-pay | Admitting: Oncology

## 2009-07-08 LAB — COMPREHENSIVE METABOLIC PANEL
ALT: 27 U/L (ref 0–53)
AST: 16 U/L (ref 0–37)
Alkaline Phosphatase: 92 U/L (ref 39–117)
Glucose, Bld: 116 mg/dL — ABNORMAL HIGH (ref 70–99)
Sodium: 140 mEq/L (ref 135–145)
Total Bilirubin: 0.3 mg/dL (ref 0.3–1.2)
Total Protein: 5.8 g/dL — ABNORMAL LOW (ref 6.0–8.3)

## 2009-07-08 LAB — CBC WITH DIFFERENTIAL/PLATELET
BASO%: 0.3 % (ref 0.0–2.0)
EOS%: 2.1 % (ref 0.0–7.0)
MCH: 30.5 pg (ref 27.2–33.4)
MCV: 88 fL (ref 79.3–98.0)
MONO%: 5.3 % (ref 0.0–14.0)
RBC: 4.07 10*6/uL — ABNORMAL LOW (ref 4.20–5.82)
RDW: 15.7 % — ABNORMAL HIGH (ref 11.0–14.6)
lymph#: 0.6 10*3/uL — ABNORMAL LOW (ref 0.9–3.3)

## 2009-07-08 LAB — LACTATE DEHYDROGENASE: LDH: 116 U/L (ref 94–250)

## 2009-09-05 ENCOUNTER — Ambulatory Visit: Payer: Self-pay | Admitting: Oncology

## 2009-09-27 ENCOUNTER — Emergency Department (HOSPITAL_COMMUNITY): Admission: EM | Admit: 2009-09-27 | Discharge: 2009-09-27 | Payer: Self-pay | Admitting: Emergency Medicine

## 2009-10-11 ENCOUNTER — Inpatient Hospital Stay (HOSPITAL_COMMUNITY): Admission: EM | Admit: 2009-10-11 | Discharge: 2009-10-15 | Payer: Self-pay | Admitting: Emergency Medicine

## 2009-10-15 ENCOUNTER — Ambulatory Visit: Payer: Self-pay | Admitting: Psychiatry

## 2009-10-15 ENCOUNTER — Inpatient Hospital Stay (HOSPITAL_COMMUNITY): Admission: AD | Admit: 2009-10-15 | Discharge: 2009-10-23 | Payer: Self-pay | Admitting: Psychiatry

## 2009-10-31 ENCOUNTER — Ambulatory Visit: Payer: Self-pay | Admitting: Oncology

## 2009-11-04 LAB — CBC WITH DIFFERENTIAL/PLATELET
BASO%: 0.5 % (ref 0.0–2.0)
Basophils Absolute: 0 10*3/uL (ref 0.0–0.1)
EOS%: 2.4 % (ref 0.0–7.0)
Eosinophils Absolute: 0.1 10*3/uL (ref 0.0–0.5)
HCT: 38 % — ABNORMAL LOW (ref 38.4–49.9)
HGB: 13.1 g/dL (ref 13.0–17.1)
LYMPH%: 14.5 % (ref 14.0–49.0)
MCH: 31.8 pg (ref 27.2–33.4)
MCHC: 34.4 g/dL (ref 32.0–36.0)
MCV: 92.3 fL (ref 79.3–98.0)
MONO#: 0.4 10*3/uL (ref 0.1–0.9)
MONO%: 8.6 % (ref 0.0–14.0)
NEUT#: 3.4 10*3/uL (ref 1.5–6.5)
NEUT%: 74 % (ref 39.0–75.0)
Platelets: 78 10*3/uL — ABNORMAL LOW (ref 140–400)
RBC: 4.12 10*6/uL — ABNORMAL LOW (ref 4.20–5.82)
RDW: 14.4 % (ref 11.0–14.6)
WBC: 4.6 10*3/uL (ref 4.0–10.3)
lymph#: 0.7 10*3/uL — ABNORMAL LOW (ref 0.9–3.3)

## 2009-11-04 LAB — COMPREHENSIVE METABOLIC PANEL
ALT: 17 U/L (ref 0–53)
AST: 15 U/L (ref 0–37)
Albumin: 4.4 g/dL (ref 3.5–5.2)
BUN: 19 mg/dL (ref 6–23)
CO2: 27 mEq/L (ref 19–32)
Calcium: 9.4 mg/dL (ref 8.4–10.5)
Chloride: 104 mEq/L (ref 96–112)
Potassium: 4.1 mEq/L (ref 3.5–5.3)

## 2009-11-04 LAB — LACTATE DEHYDROGENASE: LDH: 148 U/L (ref 94–250)

## 2009-11-07 ENCOUNTER — Other Ambulatory Visit: Payer: Self-pay

## 2009-11-07 ENCOUNTER — Other Ambulatory Visit: Payer: Self-pay | Admitting: Emergency Medicine

## 2009-11-11 ENCOUNTER — Other Ambulatory Visit: Payer: Self-pay | Admitting: Psychiatry

## 2009-11-12 ENCOUNTER — Inpatient Hospital Stay (HOSPITAL_COMMUNITY): Admission: AD | Admit: 2009-11-12 | Discharge: 2009-12-19 | Payer: Self-pay | Admitting: Psychiatry

## 2009-12-06 ENCOUNTER — Ambulatory Visit: Payer: Self-pay | Admitting: Oncology

## 2009-12-17 LAB — CBC WITH DIFFERENTIAL/PLATELET
BASO%: 0.2 % (ref 0.0–2.0)
EOS%: 3.8 % (ref 0.0–7.0)
HCT: 38.8 % (ref 38.4–49.9)
MCH: 31.9 pg (ref 27.2–33.4)
MCHC: 34.6 g/dL (ref 32.0–36.0)
MONO#: 0.5 10*3/uL (ref 0.1–0.9)
NEUT%: 77.9 % — ABNORMAL HIGH (ref 39.0–75.0)
RBC: 4.22 10*6/uL (ref 4.20–5.82)
WBC: 7.6 10*3/uL (ref 4.0–10.3)
lymph#: 0.8 10*3/uL — ABNORMAL LOW (ref 0.9–3.3)

## 2009-12-17 LAB — COMPREHENSIVE METABOLIC PANEL
ALT: 18 U/L (ref 0–53)
AST: 14 U/L (ref 0–37)
CO2: 26 mEq/L (ref 19–32)
Calcium: 9.1 mg/dL (ref 8.4–10.5)
Chloride: 103 mEq/L (ref 96–112)
Creatinine, Ser: 1.2 mg/dL (ref 0.40–1.50)
Sodium: 137 mEq/L (ref 135–145)
Total Bilirubin: 0.3 mg/dL (ref 0.3–1.2)
Total Protein: 5.9 g/dL — ABNORMAL LOW (ref 6.0–8.3)

## 2010-01-30 IMAGING — CT CT HEAD W/O CM
1 series · 16 of 30 positions shown, 20 images · non-contrast
Comparison: 08/21/2008

CLINICAL DATA: Altered level of consciousness.

CT HEAD WITHOUT CONTRAST
TECHNIQUE: Contiguous axial images were obtained from the base of
the skull through the vertex without contrast.

[Series 2: head routine 4.8 h37s · axial · 0.46mm/px · z∈[+1296,+1433]mm · 16 of 30 slices shown, 20 images]
[im 2/30  brain]
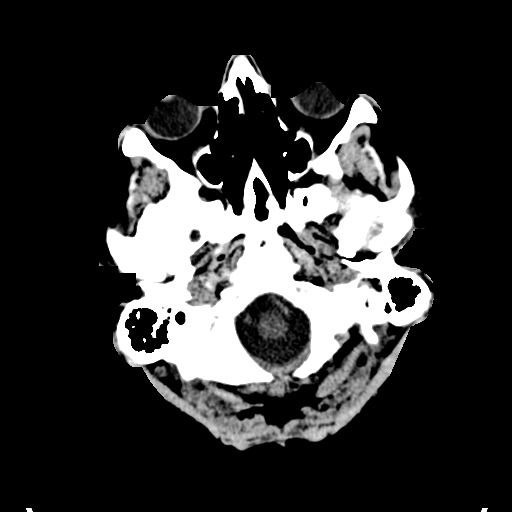
[im 2/30  bone]
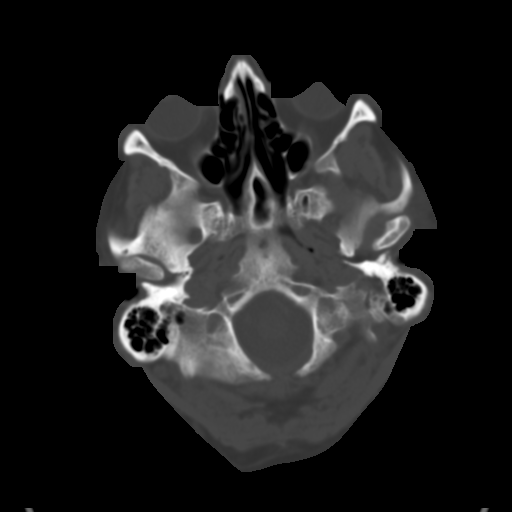
[im 4/30  brain]
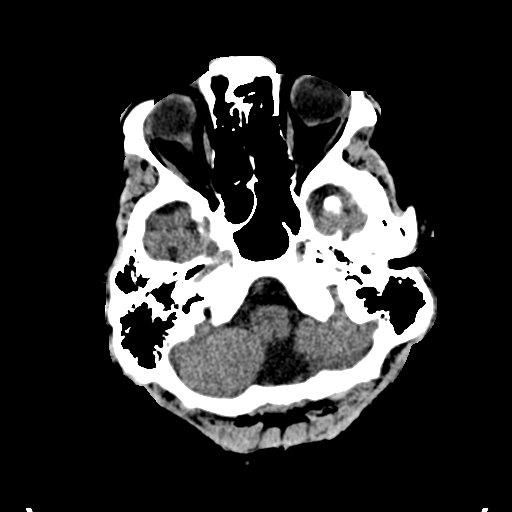
[im 6/30  brain]
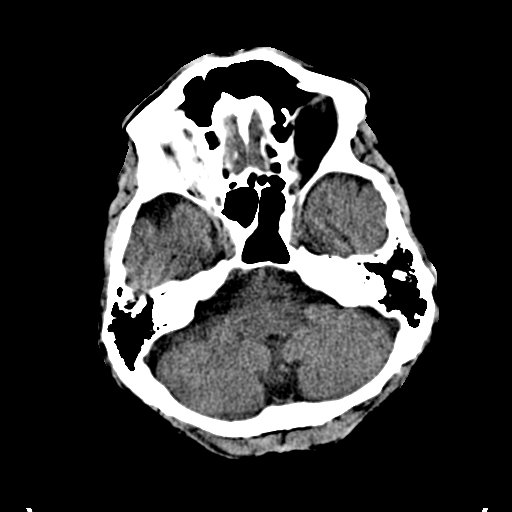
[im 8/30  brain]
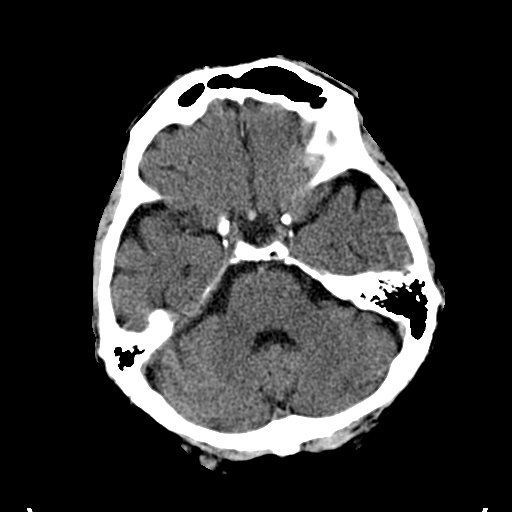
[im 9/30  brain]
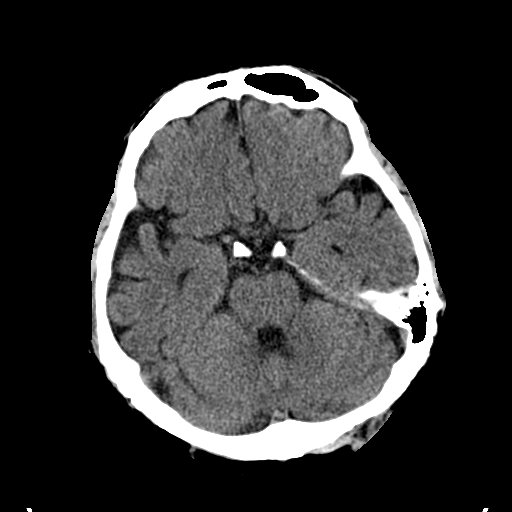
[im 9/30  bone]
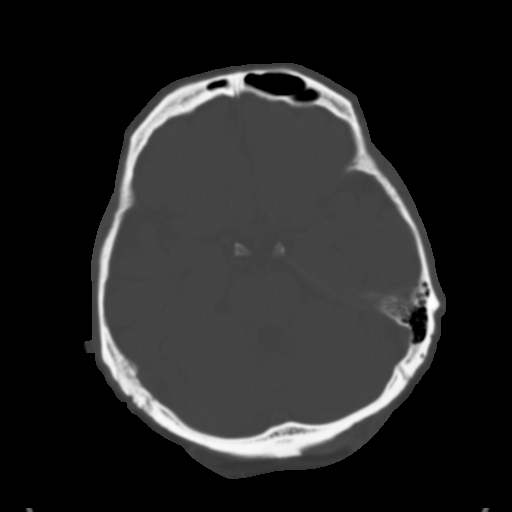
[im 11/30  brain]
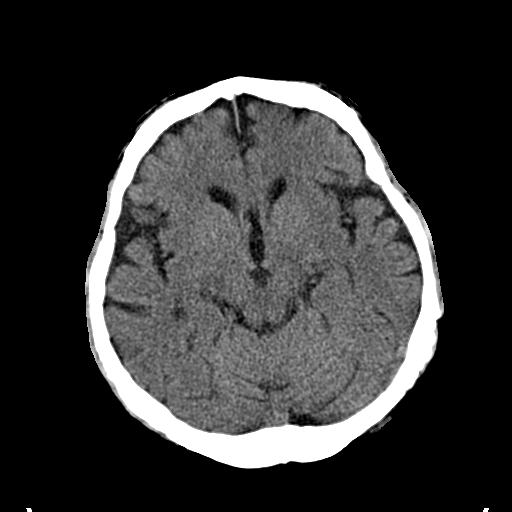
[im 13/30  brain]
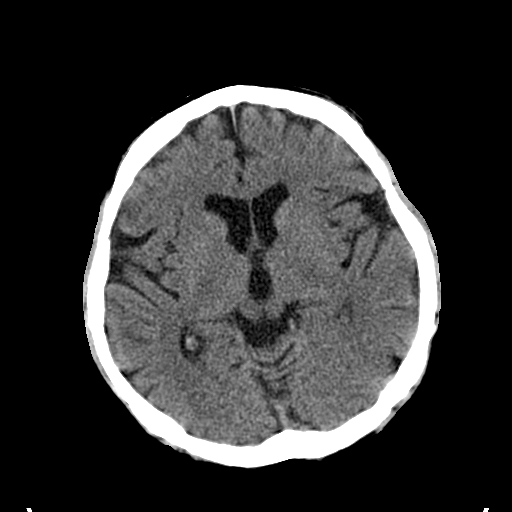
[im 15/30  brain]
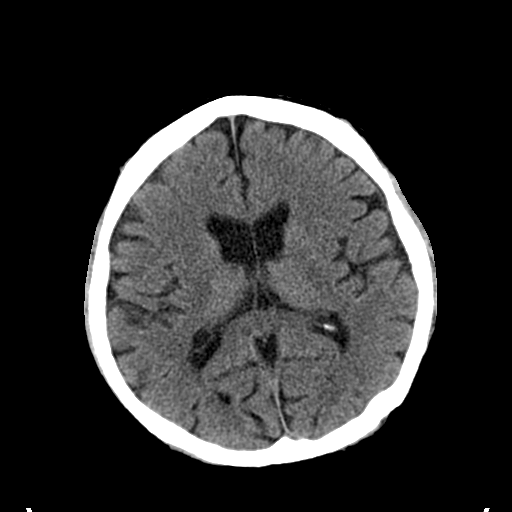
[im 16/30  brain]
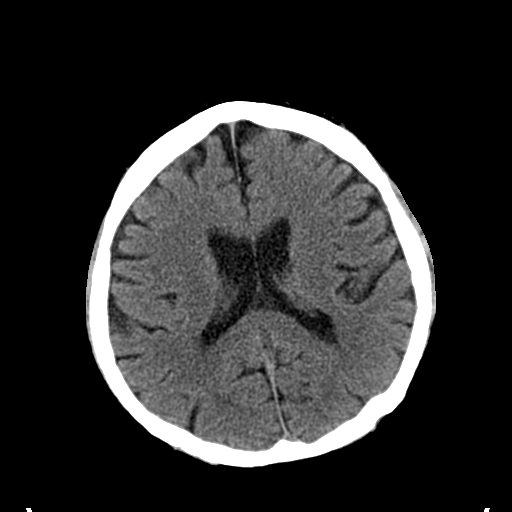
[im 16/30  bone]
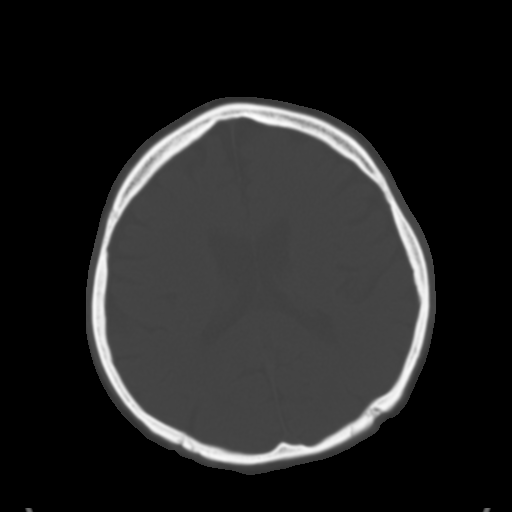
[im 18/30  brain]
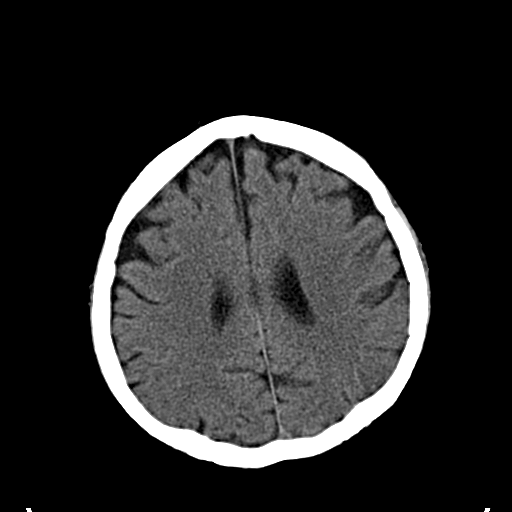
[im 20/30  brain]
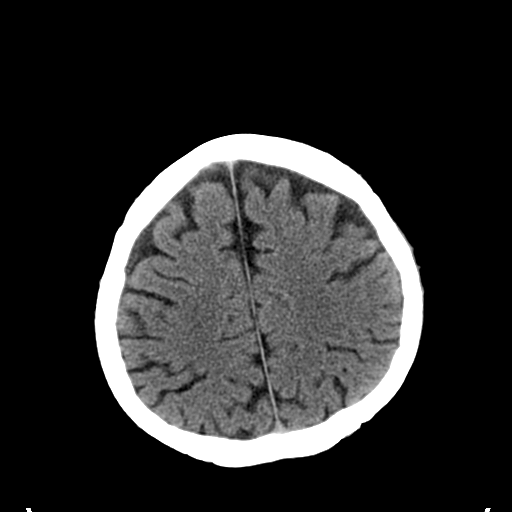
[im 22/30  brain]
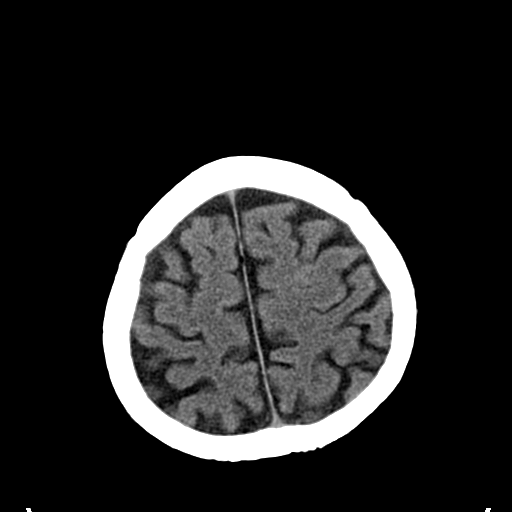
[im 23/30  brain]
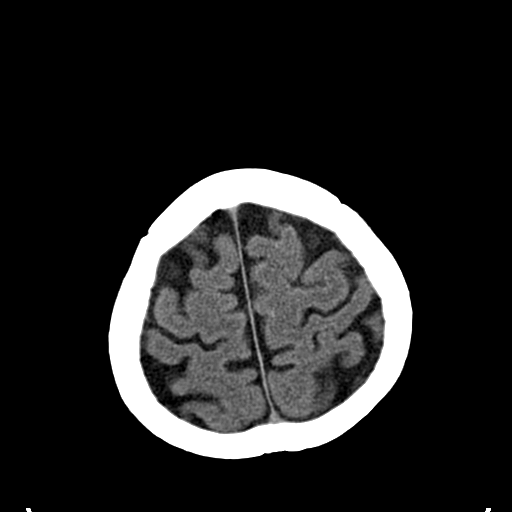
[im 23/30  bone]
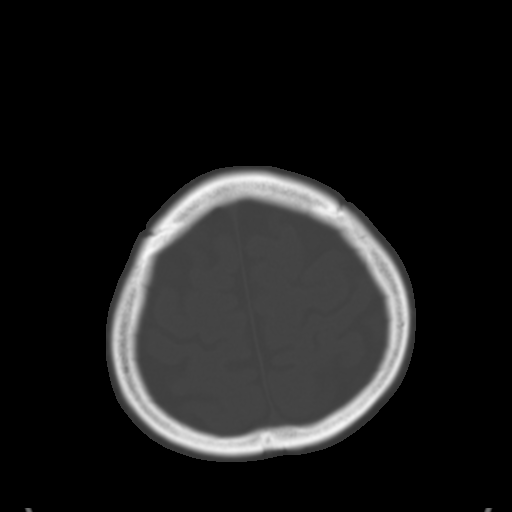
[im 25/30  brain]
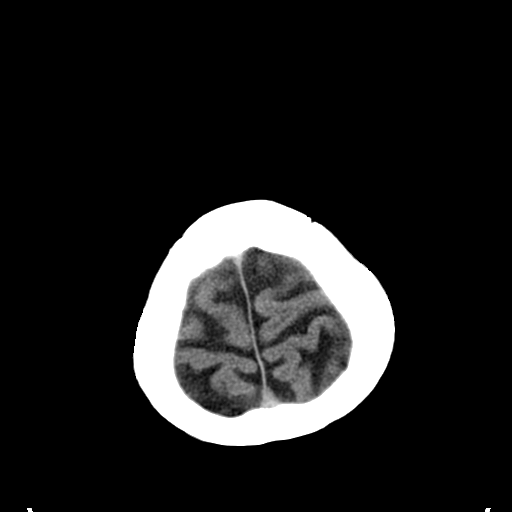
[im 27/30  brain]
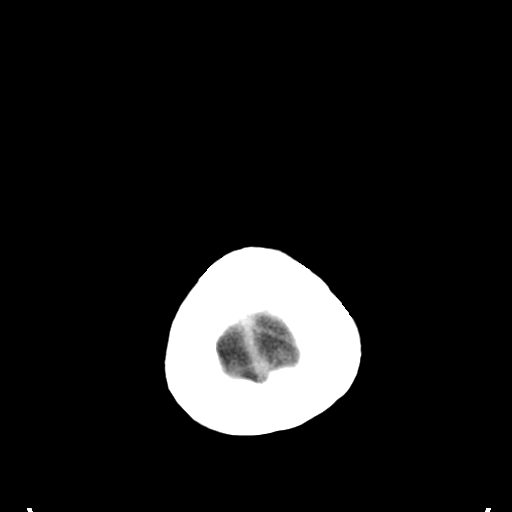
[im 29/30  brain]
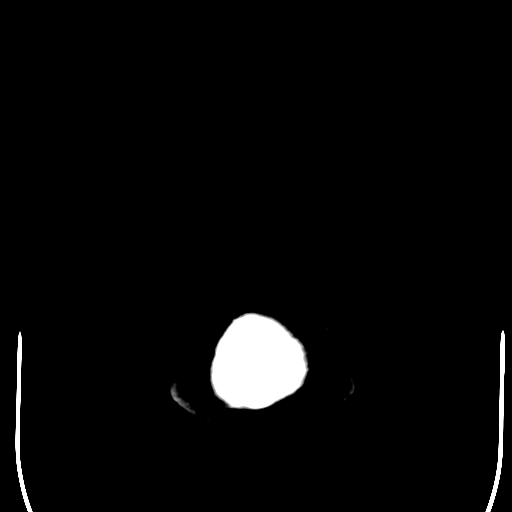

[16 of 30 positions shown; findings below may reference images not displayed]

FINDINGS: No mass lesion, mass effect, midline shift,
hydrocephalus, or hemorrhage.  Shotty posterior scalp lymph nodes
are present, extending into the upper neck, which has been seen on
prior exam of 08/21/2008.

Intracranially, no mass lesion, mass effect, midline shift,
hydrocephalus, or hemorrhage.  No territorial ischemia/infarct.
Gray white differentiation is preserved.  Bone windows appear
within normal limits.
IMPRESSION: 1.  No acute intracranial abnormality.
2.  Posterior inferior scalp and upper posterior cervical shotty
lymph nodes.  This can be associated with scalp infection.

## 2010-02-12 ENCOUNTER — Ambulatory Visit: Payer: Self-pay | Admitting: Oncology

## 2010-03-17 ENCOUNTER — Ambulatory Visit: Payer: Self-pay | Admitting: Oncology

## 2010-07-19 ENCOUNTER — Emergency Department (HOSPITAL_COMMUNITY)
Admission: EM | Admit: 2010-07-19 | Discharge: 2010-07-21 | Payer: Self-pay | Source: Home / Self Care | Admitting: Emergency Medicine

## 2010-08-05 ENCOUNTER — Ambulatory Visit: Payer: Self-pay | Admitting: Oncology

## 2010-08-07 LAB — COMPREHENSIVE METABOLIC PANEL
ALT: 12 U/L (ref 0–53)
Albumin: 4.4 g/dL (ref 3.5–5.2)
CO2: 26 mEq/L (ref 19–32)
Calcium: 9.9 mg/dL (ref 8.4–10.5)
Chloride: 110 mEq/L (ref 96–112)
Glucose, Bld: 114 mg/dL — ABNORMAL HIGH (ref 70–99)
Potassium: 4.2 mEq/L (ref 3.5–5.3)
Sodium: 144 mEq/L (ref 135–145)
Total Protein: 6 g/dL (ref 6.0–8.3)

## 2010-08-07 LAB — CBC WITH DIFFERENTIAL/PLATELET
BASO%: 0.3 % (ref 0.0–2.0)
Basophils Absolute: 0 10*3/uL (ref 0.0–0.1)
EOS%: 1.7 % (ref 0.0–7.0)
Eosinophils Absolute: 0.2 10*3/uL (ref 0.0–0.5)
HGB: 13.3 g/dL (ref 13.0–17.1)
LYMPH%: 7.6 % — ABNORMAL LOW (ref 14.0–49.0)
MCV: 91.1 fL (ref 79.3–98.0)
MONO#: 0.5 10*3/uL (ref 0.1–0.9)
NEUT#: 8.8 10*3/uL — ABNORMAL HIGH (ref 1.5–6.5)
Platelets: 155 10*3/uL (ref 140–400)
RBC: 4.5 10*6/uL (ref 4.20–5.82)
WBC: 10.2 10*3/uL (ref 4.0–10.3)

## 2010-08-07 LAB — LACTATE DEHYDROGENASE: LDH: 112 U/L (ref 94–250)

## 2010-09-16 ENCOUNTER — Emergency Department (HOSPITAL_COMMUNITY): Admission: EM | Admit: 2010-09-16 | Discharge: 2010-09-17 | Payer: Self-pay | Admitting: Emergency Medicine

## 2010-12-05 ENCOUNTER — Ambulatory Visit: Payer: Self-pay | Admitting: Oncology

## 2010-12-07 ENCOUNTER — Encounter: Payer: Self-pay | Admitting: Internal Medicine

## 2010-12-07 ENCOUNTER — Encounter (HOSPITAL_COMMUNITY): Payer: Self-pay | Admitting: Oncology

## 2010-12-09 ENCOUNTER — Other Ambulatory Visit (HOSPITAL_COMMUNITY): Payer: Self-pay | Admitting: Oncology

## 2010-12-09 DIAGNOSIS — C8590 Non-Hodgkin lymphoma, unspecified, unspecified site: Secondary | ICD-10-CM

## 2010-12-09 LAB — CBC WITH DIFFERENTIAL/PLATELET
BASO%: 0.2 % (ref 0.0–2.0)
Basophils Absolute: 0 10*3/uL (ref 0.0–0.1)
HCT: 42.5 % (ref 38.4–49.9)
MCH: 30.3 pg (ref 27.2–33.4)
MCHC: 33.8 g/dL (ref 32.0–36.0)
MCV: 89.5 fL (ref 79.3–98.0)
MONO%: 4.2 % (ref 0.0–14.0)
NEUT#: 7 10*3/uL — ABNORMAL HIGH (ref 1.5–6.5)
NEUT%: 82.8 % — ABNORMAL HIGH (ref 39.0–75.0)
RBC: 4.75 10*6/uL (ref 4.20–5.82)
RDW: 14.4 % (ref 11.0–14.6)
WBC: 8.4 10*3/uL (ref 4.0–10.3)

## 2010-12-09 LAB — COMPREHENSIVE METABOLIC PANEL
ALT: 16 U/L (ref 0–53)
Alkaline Phosphatase: 69 U/L (ref 39–117)
BUN: 18 mg/dL (ref 6–23)
Glucose, Bld: 109 mg/dL — ABNORMAL HIGH (ref 70–99)
Potassium: 4.3 mEq/L (ref 3.5–5.3)
Total Protein: 6.7 g/dL (ref 6.0–8.3)

## 2011-01-27 LAB — DIFFERENTIAL
Basophils Absolute: 0 10*3/uL (ref 0.0–0.1)
Basophils Relative: 0 % (ref 0–1)
Eosinophils Absolute: 0.3 10*3/uL (ref 0.0–0.7)
Eosinophils Relative: 2 % (ref 0–5)
Lymphocytes Relative: 12 % (ref 12–46)
Monocytes Absolute: 0.6 10*3/uL (ref 0.1–1.0)

## 2011-01-27 LAB — COMPREHENSIVE METABOLIC PANEL
AST: 21 U/L (ref 0–37)
BUN: 14 mg/dL (ref 6–23)
CO2: 27 mEq/L (ref 19–32)
Chloride: 111 mEq/L (ref 96–112)
Creatinine, Ser: 1.41 mg/dL (ref 0.4–1.5)
GFR calc non Af Amer: 52 mL/min — ABNORMAL LOW (ref >60)
Glucose, Bld: 128 mg/dL — ABNORMAL HIGH (ref 70–99)
Total Bilirubin: 0.4 mg/dL (ref 0.3–1.2)

## 2011-01-27 LAB — CBC
HCT: 41.9 % (ref 39.0–52.0)
Hemoglobin: 13.5 g/dL (ref 13.0–17.0)
MCH: 29.7 pg (ref 26.0–34.0)
MCHC: 32.2 g/dL (ref 30.0–36.0)
MCV: 92.3 fL (ref 78.0–100.0)
RBC: 4.54 MIL/uL (ref 4.22–5.81)

## 2011-01-27 LAB — URINALYSIS, ROUTINE W REFLEX MICROSCOPIC
Bilirubin Urine: NEGATIVE
Hgb urine dipstick: NEGATIVE
Ketones, ur: NEGATIVE mg/dL
Specific Gravity, Urine: 1.01 (ref 1.005–1.030)
pH: 5.5 (ref 5.0–8.0)

## 2011-01-27 LAB — LIPASE, BLOOD: Lipase: 37 U/L (ref 11–59)

## 2011-01-29 LAB — DIFFERENTIAL
Basophils Absolute: 0.1 10*3/uL (ref 0.0–0.1)
Basophils Relative: 1 % (ref 0–1)
Lymphocytes Relative: 10 % — ABNORMAL LOW (ref 12–46)
Monocytes Absolute: 0.4 10*3/uL (ref 0.1–1.0)
Monocytes Relative: 5 % (ref 3–12)
Neutro Abs: 7.4 10*3/uL (ref 1.7–7.7)
Neutrophils Relative %: 83 % — ABNORMAL HIGH (ref 43–77)

## 2011-01-29 LAB — BASIC METABOLIC PANEL
Calcium: 9.3 mg/dL (ref 8.4–10.5)
GFR calc Af Amer: >60 mL/min (ref >60)
GFR calc non Af Amer: >60 mL/min (ref >60)
Potassium: 4.3 mEq/L (ref 3.5–5.1)
Sodium: 142 mEq/L (ref 135–145)

## 2011-01-29 LAB — TRICYCLICS SCREEN, URINE: TCA Scrn: NOT DETECTED

## 2011-01-29 LAB — CBC
HCT: 40.6 % (ref 39.0–52.0)
Hemoglobin: 13.7 g/dL (ref 13.0–17.0)
MCHC: 33.8 g/dL (ref 30.0–36.0)
RDW: 13.9 % (ref 11.5–15.5)
WBC: 8.9 10*3/uL (ref 4.0–10.5)

## 2011-01-29 LAB — RAPID URINE DRUG SCREEN, HOSP PERFORMED: Tetrahydrocannabinol: NOT DETECTED

## 2011-01-29 LAB — CLOZAPINE (CLOZARIL): NorClozapine: 349 ng/mL (ref 25–400)

## 2011-01-29 LAB — ETHANOL: Alcohol, Ethyl (B): <5 mg/dL (ref 0–10)

## 2011-01-29 LAB — LITHIUM LEVEL: Lithium Lvl: 0.48 mEq/L — ABNORMAL LOW (ref 0.80–1.40)

## 2011-02-06 ENCOUNTER — Other Ambulatory Visit (HOSPITAL_COMMUNITY): Payer: Self-pay | Admitting: Oncology

## 2011-02-06 ENCOUNTER — Ambulatory Visit (HOSPITAL_COMMUNITY)
Admission: RE | Admit: 2011-02-06 | Discharge: 2011-02-06 | Disposition: A | Discharge status: Home / Self Care | Payer: Medicare Other | Source: Ambulatory Visit | Attending: Oncology | Admitting: Oncology

## 2011-02-06 ENCOUNTER — Encounter (HOSPITAL_BASED_OUTPATIENT_CLINIC_OR_DEPARTMENT_OTHER): Payer: Medicaid Other | Admitting: Oncology

## 2011-02-06 ENCOUNTER — Encounter (HOSPITAL_COMMUNITY): Payer: Self-pay

## 2011-02-06 DIAGNOSIS — N62 Hypertrophy of breast: Secondary | ICD-10-CM | POA: Insufficient documentation

## 2011-02-06 DIAGNOSIS — C8586 Other specified types of non-Hodgkin lymphoma, intrapelvic lymph nodes: Secondary | ICD-10-CM

## 2011-02-06 DIAGNOSIS — L989 Disorder of the skin and subcutaneous tissue, unspecified: Secondary | ICD-10-CM

## 2011-02-06 DIAGNOSIS — R188 Other ascites: Secondary | ICD-10-CM | POA: Insufficient documentation

## 2011-02-06 DIAGNOSIS — C8589 Other specified types of non-Hodgkin lymphoma, extranodal and solid organ sites: Secondary | ICD-10-CM | POA: Insufficient documentation

## 2011-02-06 DIAGNOSIS — C8585 Other specified types of non-Hodgkin lymphoma, lymph nodes of inguinal region and lower limb: Secondary | ICD-10-CM

## 2011-02-06 DIAGNOSIS — C8590 Non-Hodgkin lymphoma, unspecified, unspecified site: Secondary | ICD-10-CM

## 2011-02-06 DIAGNOSIS — Z856 Personal history of leukemia: Secondary | ICD-10-CM | POA: Insufficient documentation

## 2011-02-06 HISTORY — DX: Non-Hodgkin lymphoma, unspecified, unspecified site: C85.90

## 2011-02-06 HISTORY — DX: Malignant (primary) neoplasm, unspecified: C80.1

## 2011-02-06 LAB — CMP (CANCER CENTER ONLY)
ALT(SGPT): 20 U/L (ref 10–47)
AST: 19 U/L (ref 11–38)
Albumin: 3.8 g/dL (ref 3.3–5.5)
CO2: 26 mEq/L (ref 18–33)
Calcium: 9.4 mg/dL (ref 8.0–10.3)
Chloride: 104 mEq/L (ref 98–108)
Potassium: 4.6 mEq/L (ref 3.3–4.7)
Sodium: 143 mEq/L (ref 128–145)
Total Protein: 6.5 g/dL (ref 6.4–8.1)

## 2011-02-06 LAB — CBC WITH DIFFERENTIAL/PLATELET
BASO%: 0.2 % (ref 0.0–2.0)
EOS%: 1.3 % (ref 0.0–7.0)
HCT: 42.5 % (ref 38.4–49.9)
MCH: 30.8 pg (ref 27.2–33.4)
MCHC: 34.2 g/dL (ref 32.0–36.0)
MONO#: 0.4 10*3/uL (ref 0.1–0.9)
RDW: 14 % (ref 11.0–14.6)
WBC: 9.2 10*3/uL (ref 4.0–10.3)
lymph#: 1 10*3/uL (ref 0.9–3.3)

## 2011-02-06 LAB — LACTATE DEHYDROGENASE: LDH: 116 U/L (ref 94–250)

## 2011-02-06 MED ORDER — IOHEXOL 300 MG/ML  SOLN
125.0000 mL | Freq: Once | INTRAMUSCULAR | Status: AC | PRN
  Administered 2011-02-06: 125 mL via INTRAVENOUS

## 2011-02-16 LAB — DIFFERENTIAL
Basophils Absolute: 0.1 10*3/uL (ref 0.0–0.1)
Lymphocytes Relative: 11 % — ABNORMAL LOW (ref 12–46)
Lymphocytes Relative: 16 % (ref 12–46)
Lymphs Abs: 0.8 10*3/uL (ref 0.7–4.0)
Monocytes Absolute: 0.4 10*3/uL (ref 0.1–1.0)
Monocytes Absolute: 0.5 10*3/uL (ref 0.1–1.0)
Monocytes Relative: 8 % (ref 3–12)
Neutro Abs: 3.7 10*3/uL (ref 1.7–7.7)
Neutro Abs: 4.1 10*3/uL (ref 1.7–7.7)

## 2011-02-16 LAB — RAPID URINE DRUG SCREEN, HOSP PERFORMED
Cocaine: NOT DETECTED
Tetrahydrocannabinol: NOT DETECTED

## 2011-02-16 LAB — COMPREHENSIVE METABOLIC PANEL
Alkaline Phosphatase: 92 U/L (ref 39–117)
BUN: 22 mg/dL (ref 6–23)
CO2: 27 mEq/L (ref 19–32)
Chloride: 105 mEq/L (ref 96–112)
GFR calc non Af Amer: >60 mL/min (ref >60)
Glucose, Bld: 106 mg/dL — ABNORMAL HIGH (ref 70–99)
Potassium: 4.2 mEq/L (ref 3.5–5.1)
Total Bilirubin: 0.3 mg/dL (ref 0.3–1.2)

## 2011-02-16 LAB — LIPID PANEL
Cholesterol: 158 mg/dL (ref 0–200)
Total CHOL/HDL Ratio: 4.2 RATIO

## 2011-02-16 LAB — BASIC METABOLIC PANEL
Calcium: 9 mg/dL (ref 8.4–10.5)
GFR calc Af Amer: >60 mL/min (ref >60)
GFR calc non Af Amer: 53 mL/min — ABNORMAL LOW (ref >60)
Glucose, Bld: 124 mg/dL — ABNORMAL HIGH (ref 70–99)
Sodium: 136 mEq/L (ref 135–145)

## 2011-02-16 LAB — ACETAMINOPHEN LEVEL: Acetaminophen (Tylenol), Serum: <10 ug/mL — ABNORMAL LOW (ref 10–30)

## 2011-02-16 LAB — CBC
HCT: 35.5 % — ABNORMAL LOW (ref 39.0–52.0)
Hemoglobin: 12.4 g/dL — ABNORMAL LOW (ref 13.0–17.0)
Hemoglobin: 12.7 g/dL — ABNORMAL LOW (ref 13.0–17.0)
Platelets: 91 10*3/uL — ABNORMAL LOW (ref 150–400)
RBC: 3.92 MIL/uL — ABNORMAL LOW (ref 4.22–5.81)
RDW: 13.7 % (ref 11.5–15.5)
WBC: 5.2 10*3/uL (ref 4.0–10.5)

## 2011-02-16 LAB — LITHIUM LEVEL: Lithium Lvl: <0.25 mEq/L — ABNORMAL LOW (ref 0.80–1.40)

## 2011-02-16 LAB — ETHANOL: Alcohol, Ethyl (B): <5 mg/dL (ref 0–10)

## 2011-02-16 LAB — T4, FREE: Free T4: 0.93 ng/dL (ref 0.80–1.80)

## 2011-02-17 LAB — COMPREHENSIVE METABOLIC PANEL
ALT: 26 U/L (ref 0–53)
Albumin: 3.7 g/dL (ref 3.5–5.2)
Alkaline Phosphatase: 98 U/L (ref 39–117)
BUN: 16 mg/dL (ref 6–23)
Calcium: 9.1 mg/dL (ref 8.4–10.5)
Glucose, Bld: 105 mg/dL — ABNORMAL HIGH (ref 70–99)
Potassium: 4.3 mEq/L (ref 3.5–5.1)
Sodium: 137 mEq/L (ref 135–145)
Total Protein: 5.5 g/dL — ABNORMAL LOW (ref 6.0–8.3)

## 2011-02-17 LAB — CBC
MCHC: 32.8 g/dL (ref 30.0–36.0)
Platelets: 152 10*3/uL (ref 150–400)
RBC: 4.1 MIL/uL — ABNORMAL LOW (ref 4.22–5.81)
RDW: 14.6 % (ref 11.5–15.5)
WBC: 7.4 10*3/uL (ref 4.0–10.5)

## 2011-02-18 LAB — BASIC METABOLIC PANEL
BUN: 8 mg/dL (ref 6–23)
Chloride: 108 mEq/L (ref 96–112)
GFR calc Af Amer: >60 mL/min (ref >60)
GFR calc non Af Amer: >60 mL/min (ref >60)
Potassium: 3.2 mEq/L — ABNORMAL LOW (ref 3.5–5.1)

## 2011-02-18 LAB — DIFFERENTIAL
Basophils Absolute: 0.1 10*3/uL (ref 0.0–0.1)
Eosinophils Relative: 5 % (ref 0–5)
Lymphocytes Relative: 7 % — ABNORMAL LOW (ref 12–46)
Lymphocytes Relative: 9 % — ABNORMAL LOW (ref 12–46)
Lymphs Abs: 0.7 10*3/uL (ref 0.7–4.0)
Lymphs Abs: 0.7 10*3/uL (ref 0.7–4.0)
Monocytes Absolute: 0.4 10*3/uL (ref 0.1–1.0)
Monocytes Relative: 6 % (ref 3–12)
Neutro Abs: 7.9 10*3/uL — ABNORMAL HIGH (ref 1.7–7.7)
Neutrophils Relative %: 82 % — ABNORMAL HIGH (ref 43–77)

## 2011-02-18 LAB — COMPREHENSIVE METABOLIC PANEL
ALT: 17 U/L (ref 0–53)
AST: 14 U/L (ref 0–37)
AST: 14 U/L (ref 0–37)
Albumin: 3.9 g/dL (ref 3.5–5.2)
Alkaline Phosphatase: 79 U/L (ref 39–117)
BUN: 23 mg/dL (ref 6–23)
CO2: 27 mEq/L (ref 19–32)
Calcium: 9.5 mg/dL (ref 8.4–10.5)
Chloride: 112 mEq/L (ref 96–112)
Chloride: 106 mEq/L (ref 96–112)
Creatinine, Ser: 1.69 mg/dL — ABNORMAL HIGH (ref 0.4–1.5)
GFR calc Af Amer: 51 mL/min — ABNORMAL LOW (ref >60)
GFR calc Af Amer: 59 mL/min — ABNORMAL LOW (ref >60)
GFR calc non Af Amer: 42 mL/min — ABNORMAL LOW (ref >60)
Glucose, Bld: 101 mg/dL — ABNORMAL HIGH (ref 70–99)
Potassium: 4.2 mEq/L (ref 3.5–5.1)
Sodium: 141 mEq/L (ref 135–145)
Total Bilirubin: 0.6 mg/dL (ref 0.3–1.2)
Total Bilirubin: 0.5 mg/dL (ref 0.3–1.2)
Total Protein: 6.1 g/dL (ref 6.0–8.3)

## 2011-02-18 LAB — RAPID URINE DRUG SCREEN, HOSP PERFORMED
Amphetamines: NOT DETECTED
Barbiturates: NOT DETECTED
Barbiturates: NOT DETECTED
Benzodiazepines: NOT DETECTED
Cocaine: NOT DETECTED
Opiates: NOT DETECTED
Tetrahydrocannabinol: NOT DETECTED

## 2011-02-18 LAB — CBC
HCT: 38.6 % — ABNORMAL LOW (ref 39.0–52.0)
MCHC: 34.7 g/dL (ref 30.0–36.0)
MCV: 92.5 fL (ref 78.0–100.0)
Platelets: 113 10*3/uL — ABNORMAL LOW (ref 150–400)
RBC: 4.17 MIL/uL — ABNORMAL LOW (ref 4.22–5.81)
RDW: 13.8 % (ref 11.5–15.5)
WBC: 7.2 10*3/uL (ref 4.0–10.5)

## 2011-02-18 LAB — LITHIUM LEVEL
Lithium Lvl: 0.86 mEq/L (ref 0.80–1.40)
Lithium Lvl: 0.9 mEq/L (ref 0.80–1.40)
Lithium Lvl: 0.95 mEq/L (ref 0.80–1.40)
Lithium Lvl: 2.01 mEq/L (ref 0.80–1.40)

## 2011-02-18 LAB — CK TOTAL AND CKMB (NOT AT ARMC)
CK, MB: 2.8 ng/mL (ref 0.3–4.0)
Relative Index: INVALID (ref 0.0–2.5)
Total CK: 59 U/L (ref 7–232)

## 2011-02-18 LAB — URINALYSIS, ROUTINE W REFLEX MICROSCOPIC
Glucose, UA: NEGATIVE mg/dL
Glucose, UA: NEGATIVE mg/dL
Hgb urine dipstick: NEGATIVE
Hgb urine dipstick: NEGATIVE
Ketones, ur: NEGATIVE mg/dL
Protein, ur: NEGATIVE mg/dL
Urobilinogen, UA: 0.2 mg/dL (ref 0.0–1.0)
pH: 6 (ref 5.0–8.0)

## 2011-02-18 LAB — HEPATITIS B SURFACE ANTIGEN: Hepatitis B Surface Ag: NEGATIVE

## 2011-02-18 LAB — ETHANOL: Alcohol, Ethyl (B): <5 mg/dL (ref 0–10)

## 2011-02-18 LAB — SALICYLATE LEVEL: Salicylate Lvl: <4 mg/dL (ref 2.8–20.0)

## 2011-02-18 LAB — TROPONIN I: Troponin I: 0.01 ng/mL (ref 0.00–0.06)

## 2011-02-18 LAB — ACETAMINOPHEN LEVEL: Acetaminophen (Tylenol), Serum: <10 ug/mL — ABNORMAL LOW (ref 10–30)

## 2011-02-23 LAB — CBC
HCT: 29.2 % — ABNORMAL LOW (ref 39.0–52.0)
HCT: 29.7 % — ABNORMAL LOW (ref 39.0–52.0)
HCT: 30.5 % — ABNORMAL LOW (ref 39.0–52.0)
HCT: 31.7 % — ABNORMAL LOW (ref 39.0–52.0)
Hemoglobin: 10 g/dL — ABNORMAL LOW (ref 13.0–17.0)
Hemoglobin: 9.8 g/dL — ABNORMAL LOW (ref 13.0–17.0)
Hemoglobin: 9.8 g/dL — ABNORMAL LOW (ref 13.0–17.0)
MCHC: 32.8 g/dL (ref 30.0–36.0)
MCHC: 33.5 g/dL (ref 30.0–36.0)
MCHC: 33.5 g/dL (ref 30.0–36.0)
MCHC: 33.9 g/dL (ref 30.0–36.0)
MCV: 85.1 fL (ref 78.0–100.0)
MCV: 85.6 fL (ref 78.0–100.0)
MCV: 85.8 fL (ref 78.0–100.0)
MCV: 86.1 fL (ref 78.0–100.0)
Platelets: 175 10*3/uL (ref 150–400)
Platelets: 175 10*3/uL (ref 150–400)
Platelets: 178 10*3/uL (ref 150–400)
Platelets: 179 10*3/uL (ref 150–400)
Platelets: 182 10*3/uL (ref 150–400)
Platelets: 196 10*3/uL (ref 150–400)
RBC: 3.57 MIL/uL — ABNORMAL LOW (ref 4.22–5.81)
RBC: 3.67 MIL/uL — ABNORMAL LOW (ref 4.22–5.81)
RDW: 15.8 % — ABNORMAL HIGH (ref 11.5–15.5)
RDW: 15.9 % — ABNORMAL HIGH (ref 11.5–15.5)
RDW: 16.1 % — ABNORMAL HIGH (ref 11.5–15.5)
WBC: 1.7 10*3/uL — ABNORMAL LOW (ref 4.0–10.5)
WBC: 1.9 10*3/uL — ABNORMAL LOW (ref 4.0–10.5)
WBC: 2 10*3/uL — ABNORMAL LOW (ref 4.0–10.5)
WBC: 2.6 10*3/uL — ABNORMAL LOW (ref 4.0–10.5)

## 2011-02-23 LAB — BASIC METABOLIC PANEL
BUN: 13 mg/dL (ref 6–23)
BUN: 13 mg/dL (ref 6–23)
BUN: 9 mg/dL (ref 6–23)
CO2: 29 mEq/L (ref 19–32)
Calcium: 9.2 mg/dL (ref 8.4–10.5)
Chloride: 106 mEq/L (ref 96–112)
Chloride: 107 mEq/L (ref 96–112)
Creatinine, Ser: 1.06 mg/dL (ref 0.4–1.5)
Creatinine, Ser: 1.21 mg/dL (ref 0.4–1.5)
GFR calc Af Amer: >60 mL/min (ref >60)
GFR calc non Af Amer: >60 mL/min (ref >60)
Glucose, Bld: 113 mg/dL — ABNORMAL HIGH (ref 70–99)
Potassium: 4.1 mEq/L (ref 3.5–5.1)
Sodium: 139 mEq/L (ref 135–145)

## 2011-02-23 LAB — LITHIUM LEVEL: Lithium Lvl: 1.25 mEq/L (ref 0.80–1.40)

## 2011-02-23 LAB — VANCOMYCIN, TROUGH: Vancomycin Tr: 16.8 ug/mL (ref 10.0–20.0)

## 2011-02-24 LAB — CBC
HCT: 30.4 % — ABNORMAL LOW (ref 39.0–52.0)
HCT: 30.6 % — ABNORMAL LOW (ref 39.0–52.0)
HCT: 32.6 % — ABNORMAL LOW (ref 39.0–52.0)
HCT: 33.5 % — ABNORMAL LOW (ref 39.0–52.0)
Hemoglobin: 11.6 g/dL — ABNORMAL LOW (ref 13.0–17.0)
MCHC: 33.2 g/dL (ref 30.0–36.0)
MCHC: 33.9 g/dL (ref 30.0–36.0)
MCV: 86.6 fL (ref 78.0–100.0)
MCV: 87 fL (ref 78.0–100.0)
Platelets: 128 10*3/uL — ABNORMAL LOW (ref 150–400)
Platelets: 144 10*3/uL — ABNORMAL LOW (ref 150–400)
Platelets: 145 10*3/uL — ABNORMAL LOW (ref 150–400)
Platelets: 152 10*3/uL (ref 150–400)
Platelets: 169 10*3/uL (ref 150–400)
Platelets: 127 10*3/uL — ABNORMAL LOW (ref 150–400)
RDW: 15.6 % — ABNORMAL HIGH (ref 11.5–15.5)
RDW: 15.8 % — ABNORMAL HIGH (ref 11.5–15.5)
RDW: 16.3 % — ABNORMAL HIGH (ref 11.5–15.5)
RDW: 16.4 % — ABNORMAL HIGH (ref 11.5–15.5)
WBC: 2 10*3/uL — ABNORMAL LOW (ref 4.0–10.5)
WBC: 2.3 10*3/uL — ABNORMAL LOW (ref 4.0–10.5)
WBC: 5.4 10*3/uL (ref 4.0–10.5)

## 2011-02-24 LAB — CULTURE, ROUTINE-ABSCESS

## 2011-02-24 LAB — URINALYSIS, ROUTINE W REFLEX MICROSCOPIC
Glucose, UA: NEGATIVE mg/dL
Hgb urine dipstick: NEGATIVE
Protein, ur: NEGATIVE mg/dL
pH: 6.5 (ref 5.0–8.0)

## 2011-02-24 LAB — DIFFERENTIAL
Basophils Absolute: 0 10*3/uL (ref 0.0–0.1)
Basophils Absolute: 0 10*3/uL (ref 0.0–0.1)
Basophils Relative: 0 % (ref 0–1)
Basophils Relative: 1 % (ref 0–1)
Eosinophils Absolute: 0.1 10*3/uL (ref 0.0–0.7)
Eosinophils Relative: 2 % (ref 0–5)
Eosinophils Relative: 4 % (ref 0–5)
Lymphocytes Relative: 7 % — ABNORMAL LOW (ref 12–46)
Lymphocytes Relative: 9 % — ABNORMAL LOW (ref 12–46)
Lymphs Abs: 0.5 10*3/uL — ABNORMAL LOW (ref 0.7–4.0)
Monocytes Absolute: 0.4 10*3/uL (ref 0.1–1.0)
Neutro Abs: 2.1 10*3/uL (ref 1.7–7.7)
Neutrophils Relative %: 67 % (ref 43–77)

## 2011-02-24 LAB — VANCOMYCIN, TROUGH: Vancomycin Tr: 20 ug/mL (ref 10.0–20.0)

## 2011-02-24 LAB — BASIC METABOLIC PANEL
BUN: 11 mg/dL (ref 6–23)
BUN: 12 mg/dL (ref 6–23)
BUN: 14 mg/dL (ref 6–23)
BUN: 16 mg/dL (ref 6–23)
Calcium: 9.2 mg/dL (ref 8.4–10.5)
Calcium: 9.4 mg/dL (ref 8.4–10.5)
Chloride: 106 mEq/L (ref 96–112)
Creatinine, Ser: 1.2 mg/dL (ref 0.4–1.5)
GFR calc Af Amer: >60 mL/min (ref >60)
GFR calc non Af Amer: 53 mL/min — ABNORMAL LOW (ref >60)
GFR calc non Af Amer: 60 mL/min — ABNORMAL LOW (ref >60)
GFR calc non Af Amer: >60 mL/min (ref >60)
GFR calc non Af Amer: >60 mL/min (ref >60)
GFR calc non Af Amer: >60 mL/min (ref >60)
Glucose, Bld: 113 mg/dL — ABNORMAL HIGH (ref 70–99)
Glucose, Bld: 119 mg/dL — ABNORMAL HIGH (ref 70–99)
Potassium: 3.7 mEq/L (ref 3.5–5.1)
Potassium: 3.7 mEq/L (ref 3.5–5.1)
Potassium: 3.8 mEq/L (ref 3.5–5.1)
Potassium: 4 mEq/L (ref 3.5–5.1)
Potassium: 4.2 mEq/L (ref 3.5–5.1)
Sodium: 137 mEq/L (ref 135–145)
Sodium: 138 mEq/L (ref 135–145)
Sodium: 138 mEq/L (ref 135–145)

## 2011-02-24 LAB — RAPID URINE DRUG SCREEN, HOSP PERFORMED
Barbiturates: NOT DETECTED
Cocaine: NOT DETECTED
Opiates: NOT DETECTED
Tetrahydrocannabinol: NOT DETECTED

## 2011-02-24 LAB — COMPREHENSIVE METABOLIC PANEL
ALT: 10 U/L (ref 0–53)
AST: 12 U/L (ref 0–37)
Albumin: 3.2 g/dL — ABNORMAL LOW (ref 3.5–5.2)
Alkaline Phosphatase: 47 U/L (ref 39–117)
BUN: 16 mg/dL (ref 6–23)
Chloride: 107 mEq/L (ref 96–112)
GFR calc Af Amer: >60 mL/min (ref >60)
Potassium: 3.7 mEq/L (ref 3.5–5.1)
Sodium: 138 mEq/L (ref 135–145)
Total Bilirubin: 0.7 mg/dL (ref 0.3–1.2)
Total Protein: 5.3 g/dL — ABNORMAL LOW (ref 6.0–8.3)

## 2011-02-24 LAB — HEPATIC FUNCTION PANEL
Albumin: 3.8 g/dL (ref 3.5–5.2)
Alkaline Phosphatase: 60 U/L (ref 39–117)
Indirect Bilirubin: 0.5 mg/dL (ref 0.3–0.9)
Total Protein: 5.6 g/dL — ABNORMAL LOW (ref 6.0–8.3)

## 2011-02-24 LAB — ANAEROBIC CULTURE

## 2011-02-24 LAB — CULTURE, BLOOD (ROUTINE X 2)

## 2011-02-24 LAB — TSH: TSH: 10.554 u[IU]/mL — ABNORMAL HIGH (ref 0.350–4.500)

## 2011-02-24 LAB — MAGNESIUM: Magnesium: 2.4 mg/dL (ref 1.5–2.5)

## 2011-02-24 LAB — T4, FREE: Free T4: 0.88 ng/dL — ABNORMAL LOW (ref 0.80–1.80)

## 2011-02-24 LAB — RPR: RPR Ser Ql: NONREACTIVE

## 2011-02-24 LAB — LITHIUM LEVEL: Lithium Lvl: 1.08 mEq/L (ref 0.80–1.40)

## 2011-03-02 LAB — GLUCOSE, CAPILLARY: Glucose-Capillary: 95 mg/dL (ref 70–99)

## 2011-03-03 LAB — BASIC METABOLIC PANEL
BUN: 11 mg/dL (ref 6–23)
BUN: 13 mg/dL (ref 6–23)
BUN: 14 mg/dL (ref 6–23)
BUN: 16 mg/dL (ref 6–23)
CO2: 25 mEq/L (ref 19–32)
Calcium: 9 mg/dL (ref 8.4–10.5)
Calcium: 9.2 mg/dL (ref 8.4–10.5)
Calcium: 9.5 mg/dL (ref 8.4–10.5)
Chloride: 104 mEq/L (ref 96–112)
Chloride: 108 mEq/L (ref 96–112)
Chloride: 110 mEq/L (ref 96–112)
Creatinine, Ser: 1.06 mg/dL (ref 0.4–1.5)
Creatinine, Ser: 1.11 mg/dL (ref 0.4–1.5)
Creatinine, Ser: 1.12 mg/dL (ref 0.4–1.5)
GFR calc Af Amer: >60 mL/min (ref >60)
GFR calc Af Amer: >60 mL/min (ref >60)
GFR calc Af Amer: >60 mL/min (ref >60)
GFR calc non Af Amer: 51 mL/min — ABNORMAL LOW (ref >60)
GFR calc non Af Amer: 55 mL/min — ABNORMAL LOW (ref >60)
GFR calc non Af Amer: >60 mL/min (ref >60)
GFR calc non Af Amer: >60 mL/min (ref >60)
GFR calc non Af Amer: >60 mL/min (ref >60)
Glucose, Bld: 101 mg/dL — ABNORMAL HIGH (ref 70–99)
Glucose, Bld: 110 mg/dL — ABNORMAL HIGH (ref 70–99)
Glucose, Bld: 116 mg/dL — ABNORMAL HIGH (ref 70–99)
Potassium: 3.9 mEq/L (ref 3.5–5.1)
Potassium: 4.1 mEq/L (ref 3.5–5.1)
Potassium: 4.4 mEq/L (ref 3.5–5.1)
Potassium: 4.5 mEq/L (ref 3.5–5.1)
Potassium: 4.6 mEq/L (ref 3.5–5.1)
Sodium: 136 mEq/L (ref 135–145)
Sodium: 138 mEq/L (ref 135–145)
Sodium: 141 mEq/L (ref 135–145)
Sodium: 144 mEq/L (ref 135–145)

## 2011-03-03 LAB — DIFFERENTIAL
Basophils Absolute: 0 10*3/uL (ref 0.0–0.1)
Basophils Absolute: 0 10*3/uL (ref 0.0–0.1)
Basophils Relative: 0 % (ref 0–1)
Basophils Relative: 0 % (ref 0–1)
Lymphocytes Relative: 2 % — ABNORMAL LOW (ref 12–46)
Lymphocytes Relative: 3 % — ABNORMAL LOW (ref 12–46)
Lymphs Abs: 0.4 10*3/uL — ABNORMAL LOW (ref 0.7–4.0)
Monocytes Relative: 5 % (ref 3–12)
Monocytes Relative: 6 % (ref 3–12)
Neutro Abs: 12.6 10*3/uL — ABNORMAL HIGH (ref 1.7–7.7)
Neutro Abs: 15.7 10*3/uL — ABNORMAL HIGH (ref 1.7–7.7)
Neutrophils Relative %: 92 % — ABNORMAL HIGH (ref 43–77)

## 2011-03-03 LAB — CBC
HCT: 26.2 % — ABNORMAL LOW (ref 39.0–52.0)
HCT: 29.2 % — ABNORMAL LOW (ref 39.0–52.0)
HCT: 31.7 % — ABNORMAL LOW (ref 39.0–52.0)
Hemoglobin: 10.2 g/dL — ABNORMAL LOW (ref 13.0–17.0)
Hemoglobin: 10.6 g/dL — ABNORMAL LOW (ref 13.0–17.0)
Hemoglobin: 9 g/dL — ABNORMAL LOW (ref 13.0–17.0)
Hemoglobin: 9.8 g/dL — ABNORMAL LOW (ref 13.0–17.0)
MCHC: 34.3 g/dL (ref 30.0–36.0)
MCHC: 34.6 g/dL (ref 30.0–36.0)
MCV: 84.7 fL (ref 78.0–100.0)
MCV: 85.1 fL (ref 78.0–100.0)
MCV: 85.1 fL (ref 78.0–100.0)
MCV: 85.7 fL (ref 78.0–100.0)
Platelets: 144 10*3/uL — ABNORMAL LOW (ref 150–400)
Platelets: 87 10*3/uL — ABNORMAL LOW (ref 150–400)
Platelets: 90 10*3/uL — ABNORMAL LOW (ref 150–400)
RBC: 3.53 MIL/uL — ABNORMAL LOW (ref 4.22–5.81)
RBC: 3.63 MIL/uL — ABNORMAL LOW (ref 4.22–5.81)
RBC: 3.72 MIL/uL — ABNORMAL LOW (ref 4.22–5.81)
RDW: 18.1 % — ABNORMAL HIGH (ref 11.5–15.5)
RDW: 18.2 % — ABNORMAL HIGH (ref 11.5–15.5)
RDW: 18.7 % — ABNORMAL HIGH (ref 11.5–15.5)
WBC: 13.7 10*3/uL — ABNORMAL HIGH (ref 4.0–10.5)
WBC: 17 10*3/uL — ABNORMAL HIGH (ref 4.0–10.5)
WBC: 7.7 10*3/uL (ref 4.0–10.5)

## 2011-03-03 LAB — LITHIUM LEVEL: Lithium Lvl: 1.12 mEq/L (ref 0.80–1.40)

## 2011-03-03 LAB — BRAIN NATRIURETIC PEPTIDE: Pro B Natriuretic peptide (BNP): 33.7 pg/mL (ref 0.0–100.0)
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
## 2011-03-31 NOTE — Consult Note (Signed)
NAMEJARED, [name]              ACCOUNT NO.:  [id number]   MEDICAL RECORD NO.:  [id number]          PATIENT TYPE:  INP   LOCATION:  1402                         FACILITY:  [facility]   PHYSICIAN:  Felizardo Hoffmann, M.D.  DATE OF BIRTH:  [birth year]   DATE OF CONSULTATION:  12/20/2008  DATE OF DISCHARGE:                                 CONSULTATION   REASON FOR CONSULTATION:  Psychosis.   HISTORY OF PRESENT ILLNESS:  [name] is a 60 year old male  admitted to the [facility] on December 18, 2008 due to  hypoxemia, leukocytosis and anemia.   [name] has been experiencing a return of bizarre delusions for  approximately 2 weeks.  He is not combative with staff.  He does have  intact orientation.  He appears to have intact memory function, as well.  However, he does answer standard mental status questions with some  looseness of associations, and some ideas of reference.   He clearly has impaired judgment with a bizarre delusional system.   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   He reportedly does have a baseline of intellectual challenge.  He has  been residing in a group home.   He was on clozapine 200 mg in the morning with 300 mg twice a day.  This  was discontinued upon admission.  His lithium was also discontinued.   The undersigned restarted his lithium yesterday.   As an outpatient, he was also on Zyprexa 30 mg twice daily, Cogentin 1  mg q.h.s.   PAST PSYCHIATRIC HISTORY:  [name] does have a history of being in a  psychiatric hospital.   In review of the past medical record, [name] is mentioned.   [name] was admitted to the inpatient [facility]  in September of 2003.  At that time, he had significant lethargy.  He  was describing dreams and was not having any hallucinations, however.  He was discharged on September 11.  It was noted at  that time that he  was on Clozaril, and the Clozaril was tapered due to over sedation.  They noted that his thought process was no longer confused after he was  discharged.  Also, he was not displaying any psychotic symptoms.  At  that time, he was displaying a sense of humor and was pleasant upon  discharge.   (At this time, he is having bizarre delusional content.).  In fall of  2009, in the record it was noted that he had a flat affect, some slight  illogia on thought process.  However, he had no hallucinations (the  staff reports that he has been having hallucinations this admission).   At the time in the fall of 2009, he was having some mild ideas of  reference.   FAMILY PSYCHIATRIC HISTORY:  None known.   SOCIAL HISTORY:  [name] states that he received his GED.  He is  single.  He states that he has one child.  [name]  resides at a  group home.  Occupation - unemployed and on medical disability.  He does  not use any alcohol or illegal drugs.   PAST MEDICAL HISTORY:  1. Acute pneumonia.  2. Chronic lymphocytic leukemia.  3. Hypothyroidism.  4. History of deep vein thrombosis.   MEDICATIONS:  The MAR is reviewed.  [name] is on lithium 300 mg  q.a.m., 600 mg q.h.s.   ALLERGIES:  NO KNOWN DRUG ALLERGIES.   LABORATORY DATA:  Sodium 144, BUN 13, creatinine 1.18, glucose 110.  WBC  8.6, hemoglobin 9.0, platelet count is decreased at 90.  TSH is low at  0.318.   EKG QTC 470 ms.   REVIEW OF SYSTEMS:  [name] is not able to provide a significant  amount of this.  The rest is gleaned from the staff and the medical  record.  Constitutional, head, eyes, ears, nose, throat, mouth,  neurologic, psychiatric, cardiovascular, respiratory, gastrointestinal,  genitourinary, skin, musculoskeletal, hematologic lymphatic, endocrine  metabolic all unremarkable except that on neurologic, 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   PHYSICAL EXAMINATION:   Temperature 98.5, pulse 79, respiratory rate 21,  blood pressure is 123/66, O2 saturation on room air 97%.  Passive range of motion at the left elbow reveals no cogwheeling or  rigidity.  As mentioned above, he does have a dyskinetic movement of his  lips which persists when he is not talking.   MENTAL STATUS EXAM:  [name] is alert.  His eye contact is good.  He  is oriented to all spheres.  His memory is grossly intact.  His affect  is mildly anxious.  Mood is mildly anxious.  Fund of knowledge and  intelligence are below average.  His speech is somewhat robotic in  prosody.  There is no dysarthria.  Thought process - please see the  history of present illness.  Thought content - he states that he met  [name] when he was 24 years old and has been working for it  him ever since.  He has poor insight and impaired judgment.   ASSESSMENT:  AXIS I:  293.81 - psychotic disorder not otherwise  specified, rule out schizoaffective disorder.  AXIS II:  Deferred.  AXIS III:  See past medical history.  AXIS IV:  General medical.  AXIS V:  30.   [name] does present an exacerbation of psychosis that has not been  present in the fall.   Also, he is presenting with thrombocytopenia which provides some concern  that he might have had a contribution to the thrombocytopenia from  psychotropic medication (however, this is not yet confirmed).   The Clozaril has been held.   RECOMMENDATIONS:  1. Given that [name]'s psychosis is exacerbated, he would be a      candidate for an inpatient psychiatric unit once he is medically      cleared.  However, a sitter is not needed.  The psychiatric      hospitalization could result in further evaluation, consideration      of other psychotropic options, and the initial trial of alternative      psychotropic agents with a goal of bringing his psychosis back to      baseline.  2. For now, would continue the lithium at 900 mg daily,  rechecking a      12-hour lithium trough level at the 6th day after restarting and      rechecking a basic metabolic  panel at the same lab draw.  3. Concur with thyroid correction.  4. Concur with starting Zyprexa 10 mg p.o. IM q. 1600 for anti      psychosis, continuing to monitor for any rigidity or other      extrapyramidal side effects of the Zyprexa.  Would recheck his EKG      QTC after starting the Zyprexa and discontinue if over 500 ms.  5. Will defer other psychotropic medication at this time.  6. Would continue to recheck the platelet count.   DISCUSSION:  As the attending physician mentioned, the platelet count  reduction may be secondary to the chemotherapy course.   It is noted that upon discharge in October of 2009, his platelet count  was 188; however, he was just finishing at that time a 3-day course of  chemotherapy.  A platelet count on September 14, 2008, 20 days after that  October discharge, was 144 in the emergency room.      Felizardo Hoffmann, M.D.  Electronically Signed     JW/MEDQ  D:  12/20/2008  T:  12/20/2008  Job:  [id number]

## 2011-03-31 NOTE — Consult Note (Signed)
NAMEANTONIE, FAUSNAUGH              ACCOUNT NO.:  [id number]   MEDICAL RECORD NO.:  [id number]          PATIENT TYPE:  IPS   LOCATION:  0401                          FACILITY:  [facility]   PHYSICIAN:  Felizardo Hoffmann, M.D.  DATE OF BIRTH:  08-Oct-1951   DATE OF CONSULTATION:  12/25/2008  DATE OF DISCHARGE:                                 CONSULTATION   Mr. Barrozo does continue with ideas of reference, inappropriate  laughter and delusions.  His judgment is impaired.  He is not combative.  He is redirectable.  He is cooperative with bedside care.   REVIEW OF SYSTEMS:  NEUROLOGIC:  No stiffness or other extrapyramidal  side effects with his Zyprexa.   He is not having any lithium side effects.   EXAMINATION:  VITAL SIGNS:  Temperature 97.4, pulse 85, respiratory rate  20, blood pressure 120/65, O2 saturation on room air 96%.  MENTAL STATUS EXAM:  Mr. Shambaugh is alert.  His eye contact is  intermittent.  His affect is inappropriate.  He laughs out loud with his  ideas of reference.  His mood is mildly euphoric.  He is oriented to  person.  He cannot formally participate in mental status questions due  to his ideas of reference, as well as his impaired reasoning.  His  speech involves normal rate with no dysarthria.  Thought process  involves some illogia.  His thought content involves delusions and ideas  of reference.  His insight is poor.  His judgment is impaired.   ASSESSMENT:  AXIS I:  293.81 - psychotic disorder not otherwise  specified.  Rule out schizoaffective disorder.   Based upon review of the past record and Mr. Varone's current  presentation, he does have an exacerbation of his psychosis which could  respond to inpatient psychiatric treatment.   RECOMMENDATIONS:  1. Therefore, the undersigned recommends that Mr. Greenfeld be admitted      to an inpatient psychiatric ward.  2. Would continue his lithium trial for mood stabilization and would      continue his Zyprexa trial  for anti psychosis.  3. Would continue to monitor for stiffness or other extrapyramidal      side effects with his Zyprexa trial and provide low stimulation ego      support.      Felizardo Hoffmann, M.D.  Electronically Signed     JW/MEDQ  D:  12/25/2008  T:  12/26/2008  Job:  [id number]

## 2011-03-31 NOTE — Consult Note (Signed)
[name], [name]              ACCOUNT NO.:  [id number]   MEDICAL RECORD NO.:  [id number]          PATIENT TYPE:  INP   LOCATION:  1507                         FACILITY:  [facility]   PHYSICIAN:  Melrose Nakayama, MD  DATE OF BIRTH:  [birth year]   DATE OF CONSULTATION:  04/12/2009  DATE OF DISCHARGE:                                 CONSULTATION   REQUESTING PHYSICIAN:  Dr. Bubba Camp.   REASON FOR CONSULTATION:  Right thumb swelling and infection.   BRIEF HISTORY:  The patient is a 60 year old, right-hand dominant  gentleman who had a 3-day onset of pain and swelling in his right thumb.  The patient has a known history of hypertension, gout, hypothyroidism,  anemia, schizophrenia as well as immunocompromise, non-Hodgkins  lymphoma.  It is unsure the exact etiology of the pain and swelling into  his thumb.  I was consulted for an infection in the right upper  extremity.   PAST MEDICAL HISTORY:  1. Reflux disease.  2. Hypertension.  3. Hypothyroidism.  4. Gout.  5. Anemia.  6. Schizophrenia.  7. Cellulitis.  8. Chronic lymphocytic leukemia.  9. History of deep venous thrombosis.   FAMILY HISTORY:  Unobtainable.   SOCIAL HISTORY:  Lives in a group home, [facility].   ALLERGIES:  NONE LISTED.   MEDICATIONS:  List reviewed.   REVIEW OF SYSTEMS:  He has finished with recent chemotherapy, no recent  hospitalizations.   EXAMINATION:  He is an overweight gentleman in no acute distress.  He is  alert and engaged in the conversation.  RIGHT UPPER EXTREMITY:  The patient does have ascending lymphangitis and  streaking going up into his forearm into his arm.  He has a necrotic and  hemorrhagic area directly over the dorsal aspect of the right thumb with  an abscess collected area directly over the thumb metacarpal region just  distal to the [facility] joint.  He has limited mobility of the thumb and it is  draining out serous fluid.  He is able to gently flex the thumb, extend  the thumb.  His fingertips are warm, well-perfused.  He does not have  any other evidence of infection to the hand.  He has good pronation,  supination and forearm as well as wrist motion.   Radiographs of the hand do show the dorsal soft tissue swelling, do not  see any significant changes from the bone.   LAB TESTS:  White blood cell count of 3.96, platelets of 128.   IMPRESSION:  1. Right hand and thumb abscess.  2. Immunocompromise with history of lymphoma.  3. Schizophrenia.  4. Hypothyroidism.  5. Hypertension.   PLAN:  Today's findings were reviewed with the patient and the patient's  guardian was present.  It is my recommendation that the patient undergo  emergent I and D of the right thumb.  I think it is an infection, he is  immunocompromised and I think the patient would benefit from incision  and drainage of the thumb and debridement.  We talked about the  procedure and we will proceed accordingly tonight.  All  questions were  addressed today.  The patient may require more than one debridement  based on his responses to the treatment.  He has on IV antibiotics, vanc  and Zosyn which I agree with given his immunocompromise and no organism  isolated today.  Cultures will be taken in the operating room.      Melrose Nakayama, MD  Electronically Signed     FWO/MEDQ  D:  04/14/2009  T:  04/14/2009  Job:  [id number]

## 2011-03-31 NOTE — Op Note (Signed)
NAMEKURTIS, [name]              ACCOUNT NO.:  [id number]   MEDICAL RECORD NO.:  [id number]          PATIENT TYPE:  INP   LOCATION:  [id number]                         [facility]ACILITY[facility]   PHYSICIAN:  Melrose Nakayama, MD  DATE OF BIRTH:  Apr 07, 1951   DATE OF PROCEDURE:  04/12/2009  DATE OF DISCHARGE:                               OPERATIVE REPORT   PREOPERATIVE DIAGNOSIS:  Right thumb deep space infection.   POSTOPERATIVE DIAGNOSIS:  Right thumb deep space infection.   ATTENDING SURGEON:  Melrose Nakayama, MD, was scrubbed and present for  the entire procedure.   ASSISTANT SURGEON:  None.   PROCEDURE:  Incision and drainage of right thumb deep abscess with  debridement of skin and subcutaneous tissues.   ANESTHESIA:  General via LMA.   TOURNIQUET TIME:  0 minutes.   INTRAOPERATIVE CULTURES:  Aerobic and anaerobic cultures x2.   SURGICAL DRAINS:  One vessel loop used as a drain.   SURGICAL INDICATIONS:  [name] is a 60 year old right-hand-dominant  gentleman.  I was consulted for the management of his right thumb  infection.  The patient was noted to have a deep abscess over the right  thumb with a worsening infection unresponsive IV antibiotics and it was  recommended that he undergo the above procedure.  The risks, benefits  and alternatives were discussed in detail with the patient and from  patient's guardian, a signed informed consent was obtained.  The risks  include but are not limited to bleeding, infection, damage to nearby  nerves, arteries or tendons, persistent infection, loss of motion of the  thumb, potential loss of the thumb, and need for further surgical  intervention.   DESCRIPTION OF PROCEDURE:  The patient was properly identified in the  preop holding area.  A mark made on the right thumb indicated the  correct operative site.  The patient was then brought back to the  operating room and placed supine on the operating room table, where  general  anesthesia was administered.  The patient tolerated this well.  A well-padded tourniquet was then placed on the right brachium and  sealed with a 1000 drape.  The right upper extremity was then prepped  with Betadine and sterilely draped.  A time-out was called, the correct  site identified, and procedure was then begun.  Attention was turned the  right thumb, where a longitudinal incision was made directly over the  dorsal aspect of the thumb metacarpal.  Dissection was carried down  through the skin and subcutaneous tissues.  The extensor tendon was  identified.  Gross purulence was encountered.  Debridement of skin and  subcutaneous tissues was then carried throughout distally.  Several  small counterincisions were then made along the distal aspect of the  thumb in order to thoroughly drain and decompress the area.  Following  incision and drainage, 3 liters of lactated Ringer's solution was then  thoroughly irrigated through the wound.  Debridement was then carried  out with a knife, curettes and rongeurs to remove the necrotic and dead  tissue.  After further debridement, copious  irrigation was then carried  out.  The wounds, 3 of them,  were then loosely reapproximated with 1-0  and 3-0 nylon sutures in each wound respectively.  They were closed over  a vessel loop to serve as a drain.  A large sheet of Xeroform dressing  was then applied.  A sterile compressive bandage was then applied.  The  patient was then placed in a bulky thumb spica dressing.  The patient  was then x-rayed and taken to the recovery room in good condition.   POSTOPERATIVE PLAN:  The patient be admitted continued on the Internal  Medicine Service on IV antibiotics until his cultures come back and he  will return back to the operating room for a scheduled procedure within  48 hours for repeat I and D.      Melrose Nakayama, MD  Electronically Signed     FWO/MEDQ  D:  04/12/2009  T:  04/13/2009  Job:   [id number]

## 2011-03-31 NOTE — Op Note (Signed)
NAMERICHAD, Moran              ACCOUNT NO.:  [id number]   MEDICAL RECORD NO.:  [id number]          PATIENT TYPE:  AMB   LOCATION:  [place]                          FACILITY:  [facility]   PHYSICIAN:  Todd Klein, MD       DATE OF BIRTH:  Oct 07, 1951   DATE OF PROCEDURE:  09/20/2008  DATE OF DISCHARGE:                               OPERATIVE REPORT   PREOPERATIVE DIAGNOSES:  1. Chronic lymphocytic leukemia.  2. Rule out lymphoma.   POSTOPERATIVE DIAGNOSES:  1. Chronic lymphocytic leukemia.  2. Rule out lymphoma.   PROCEDURE:  Right inguinal lymph node biopsy.   SURGEON:  Todd Klein, MD.   ANESTHESIA:  General and local.   FINDINGS:  Diffusely enlarged lymph nodes in the right groin with one  large cystic, matted lymph node mass approximately 8 x 15 cm.  When that  was taken, however, there were additional enlarged nodes left.   SPECIMENS:  To pathology.   ESTIMATED BLOOD LOSS:  25 mL.   COMPLICATIONS:  None known.   PROCEDURE:  [name] is a 60 year old male with paranoid  schizophrenia and a long history of chronic lymphocytic leukemia who has  presented with a rapidly enlarging mass in the right groin.  It was a  clinical concern whether or not he had transformed into a lymphoma since  he was not responding to his chemotherapy.  I was asked to biopsy this  mass for diagnosis.  He was identified in the holding area and taken to  the operating room where he was placed supine on the operating room  table.  General endotracheal anesthesia was induced and his right groin  was shaved, prepped and draped in the sterile fashion.  A time-out was  performed according to the surgical safety check list.  This was  correct, so we elected to continue.  A parallel incision was made in the  inguinal crease approximately 12 cm in length.  The subcutaneous tissue  was divided with the Bovie electrocautery.  The large mass was  immediately identified and was dissected with the Bovie  electrocautery  and with a tonsil clamp.  Digital manipulation was used primarily to  shell this node out from the surrounding tissues.  There appeared to be  at least 2 nodes that were mounted together and diffusely enlarged and  necrotic.  The peel was taken off it.  There were 2 veins going into and  out of the mass that were ligated.  The mass was shelled out all the way  immediately.  This was not directly in contact with any of his femoral  vessels.  There was a small sensory branch of the nerve going through  the mass.  This was transected.  There was an additional firm lymph node  directly underlying this mass, which was also removed with the specimen.  It was felt that if the mass is too necrotic to obtain diagnosis,  perhaps a firm node would allow for diagnosis.  There was 1 vessel going  to the smaller node that had to be suture ligated for bleeding.  The  defect was large and it was felt that the patient was in high risk for  developing a seroma.  A 7-mm flat drain was placed inferior to the  lateral border of the wound.  It was found to be appropriately placed.  The drain was secured with 2-0 nylon suture.  The cavity was copiously  irrigated and sites of bleeding were coagulated.  This was irrigated  again and was noted to be hemostatic.  The skin was reapproximated with  interrupted 3-0 deep dermal sutures and running 4-0 Monocryl suture.  This was cleaned, dried and dressed with half-inch Steri-Strips and  benzoin as well as Tegaderm and gauze.  The patient was awakened from  anesthesia and taken to the PACU in stable condition.      Todd Klein, MD  Electronically Signed     FB/MEDQ  D:  09/20/2008  T:  09/21/2008  Job:  [id number]

## 2011-03-31 NOTE — Op Note (Signed)
[name], [name]              ACCOUNT NO.:  [id number]   MEDICAL RECORD NO.:  [id number]          PATIENT TYPE:  INP   LOCATION:  [id number]                         FACILITY:  [facility]   PHYSICIAN:  Melrose Nakayama, MD  DATE OF BIRTH:  1951-06-28   DATE OF PROCEDURE:  04/14/2009  DATE OF DISCHARGE:                               OPERATIVE REPORT   PREOPERATIVE DIAGNOSIS:  Right thumb abscess.   POSTOPERATIVE DIAGNOSIS:  Right thumb abscess.   Attending physician, Dr. Iran Planas, was scrubbed and present for the  entire procedure.   ASSISTANT SURGEON:  None.   SURGICAL PROCEDURE:  Right thumb incision and drainage and debridement  of right thumb abscess.   ANESTHESIA:  General via LMA.   TOURNIQUET TIME:  Zero minutes.   DRAINS:  None.   INTRAOPERATIVE FINDINGS:  The patient's thumb looked a lot better today.  There was no gross purulence.  The tissue looked a lot healthier around  that region of the hand and the thumb.   SURGICAL INDICATIONS:  [name] is a 69-year right-hand-dominant  gentleman who underwent I and D of his right thumb on Apr 04, 2009.  He  was scheduled to return back to the operating room for repeat I and D  given that he had a soft tissue infection.  Signed informed consent was  obtained.   DESCRIPTION OF PROCEDURE:  The patient was properly identified in the  preoperative holding area, permanent mark made on the right thumb and  right hand indicated as correct operative site.  The patient was then  brought back to the operating room, placed supine on the anesthesia room  table.  General anesthesia was administered.  The patient tolerated this  well.  He had been on Zosyn and vancomycin.  A tourniquet was then put  on the right upper extremity.  The patient tolerated this well.  The  patient is then prepped and draped in the normal sterile fashion.  A  time-out was called, the correct side was identified and the procedure  then begun.  Attention  was then turned to the right thumb where the  previous skin and sutures and drains were removed.  Debridement of the  skin and subcutaneous tissues were then carried out with a knife and  sharp scissors and curettes.  Thorough irrigation of the abscessed  region was then done with 3 liters of saline solution.  Following  thorough irrigation and debridement, the wounds that were there on the  dorsal aspect of the thumb were then loosely reapproximated with 3-0  nylon sutures.  A large sheet of Xeroform was then applied directly over  the thumb.  A sterile compressive bandage was then applied and a bulky  thumb spica dressing was then applied.  The patient was then extubated  and taken to the recovery room in good condition.   POSTOPERATIVE PLAN:  The patient looked a lot better today and will  begin some local wound care modalities beginning on April 16, 2009, with  occupational therapy and continue to see how he progresses, continue  with the  IV antibiotics until his final cultures come back.      Melrose Nakayama, MD  Electronically Signed     FWO/MEDQ  D:  04/14/2009  T:  04/14/2009  Job:  [id number]

## 2011-03-31 NOTE — H&P (Signed)
NAMEJDYN, RESENDIZ              ACCOUNT NO.:  [id number]   MEDICAL RECORD NO.:  [id number]          PATIENT TYPE:  EMS   LOCATION:  ED                           FACILITY:  [facility]   PHYSICIAN:  Girard Cooter, MD         DATE OF BIRTH:  [birth year]   DATE OF ADMISSION:  08/21/2008  DATE OF DISCHARGE:                              HISTORY & PHYSICAL   PRIMARY CARE PHYSICIAN:  Unassigned.   CHIEF COMPLAINT:  Worsening right groin area swelling, cellulitis, and  pain.   HISTORY OF PRESENT ILLNESS:  The patient is a 60 year old who presented  to [facility] with a worsening of his swelling in his  right groin after the patient was initially diagnosed with tumor burden  secondary to CLL, chronic lymphocytic leukemia, which was recently  diagnosed and managed by Dr. Ralene Ok from the [facility].  He  supposedly was watching a video at the [facility], after which he  became pale, diaphoretic, and he had syncope for a brief period of time.  He regained consciousness.  He did not have any postictal episodes,  fevers or chills, nausea or vomiting, chest pain, shortness of breath.  He regained all of his neurological status.  He has baseline  schizophrenia and baseline mental retardation.  Unknown level of mental  retardation.  He denies fevers or chills.  His caretaker and power of  attorney is in the room with him right now.  He states that he has been  feeling more fatigued, decreased activity, and decreased participation  in daily activities.  The patient was also supposedly ready to start  chemotherapy in regards to his leukemic state in the next few days.   PAST MEDICAL HISTORY:  1. Significant for recent diagnosis of CLL.  2. Schizophrenia.  3. History of DVT.   PAST SURGICAL HISTORY:  1. Status post tonsillectomy.  2. Status post orthopedic procedure of unknown type.   SOCIAL HISTORY:  Denies drugs, alcohol, or tobacco.  He also denies any  recent travel.   ALLERGIES:  No known drug allergies.   MEDICATIONS:  Listed here by the nurse:  1. Motrin 800 mg once daily.  2. Prilosec 40 mg p.o. daily.  3. Benztropine 1 mg p.o. daily.  4. Lithium carbonate 300 mg in the a.m. and 600 mg b.i.d.  5. Clozapine 100 mg in the a.m. and 300 mg in the p.m. twice daily.  6. Levothyroxine 150 mg at bedtime.   Note, medications have not been reviewed and are not finalized until  full review has been made and medication reconciliation form has been  filled out.   PHYSICAL EXAMINATION:  GENERAL:  The patient is in no acute distress.  VITAL SIGNS:  Blood pressure is 99/48, pulse 86, respirations 20,  temperature 97.6.  HEENT:  Normocephalic, atraumatic.  Sclerae anicteric.  Extraocular  motions intact.  NECK:  Supple.  No JVD.  No carotid bruits.  CARDIOVASCULAR:  S1, S2.  Regular rate and rhythm.  No murmurs, rubs,  gallops, or clicks.  LUNGS:  Clear to auscultation bilaterally.  No rhonchi, rales, or  wheezing.  EXTREMITIES:  Full range of motion.  There is a 4 x 15 cm raised  erythematous area on the right proximal anterior thigh area.  PSYCH:  At baseline, the patient is alert and oriented x3.  Cranial  nerves 2-12 grossly intact.   EKG shows a rate of 81.  No ST-T wave changes.  White count is 18.6,  hemoglobin 10.8, hematocrit 31.9, MCV 83.2, platelets 130,000.  Chest x-  ray shows no acute infiltrate, mild right soft tissue prominence,  probably due to adenopathy.  Sodium 135, potassium 4.0, chloride 109,  BUN 20, creatinine 1.7.  In review of other radiological results  performed recently, the patient had a CT scan of the chest with contrast  dated August 17, 2008, which showed marked splenomegaly.  Spleen  currently measures 23 cm, craniocaudally.  Progression of the  retroperitoneal adenopathy.  There is a right-sided hydronephrosis.  The  distal ureter is likely obstructed with a bulky right iliac node.   ASSESSMENT AND PLAN:  1. The  patient is a 60 year old with worsening right groin swelling,      likely secondary to tumor burden, questionable associated      cellulitis.  There is somewhat of an increased warmth.  2. Recent diagnosis of blood dyscrasia and questionable chronic      lymphocytic leukemia.  Currently being followed up by Dr. Ralene Ok      at the [facility].  3. 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  4. Schizophrenia.  Compensated now with no acute decompensation.      Currently on Clozaril.  5. History of deep vein thrombosis.  6. Acute renal insufficiency, likely secondary to tumor burden on the      right distal ureter.   DISCUSSION AND PLAN:  Given his cellulitis, we sent him to the medical  unit, initiated vancomycin, will proceed with blood cultures.  Will  demarcate the area with a pen to assess for clinical resolution.  I am  concerned, however, that the patient is on Clozaril, and there is some  contraindication of Clozaril with agranulocyotisis  I will go ahead and  proceed with pharmacy consultation, literature review, to find out if  there is an absolute contraindication.  Obviously, the patient is  benefitting from this medication.  I think there needs to be a detailed  risk/benefit analysis.  Acute renal insufficiency also needs to be  assessed.  It is likely secondary to tumor burden on the right distal  ureter.  In this regard, I would like to have oncology input.  At the  same time, we may decide to proceed with urology consultation.  Will  monitor his renal function, strict Is and Os, and daily weight.  We are  putting a Foley catheter in.  The rest of the plan will really depend on  his progress and consultant recommendations.      Girard Cooter, MD  Electronically Signed     RR/MEDQ  D:   08/21/2008  T:  08/21/2008  Job:  [id number]
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
## 2011-03-31 NOTE — Discharge Summary (Signed)
[name], [name]              ACCOUNT NO.:  [id number]   MEDICAL RECORD NO.:  [id number]          PATIENT TYPE:  IPS   LOCATION:  0402                          [facility]ACILITY[facility]  [facility]   PHYSICIAN:  Norm Salt, MD  DATE OF BIRTH:  12-Sep-1951   DATE OF ADMISSION:  03/18/2009  DATE OF DISCHARGE:  03/26/2009                               DISCHARGE SUMMARY   IDENTIFYING DATA AND REASON FOR ADMISSION:  This was an inpatient  psychiatric admission for [name], a 60 year old, Caucasian, single male  with a long history of chronic schizophrenia.  He had been living in a  group home and was transferred to [place] for treatment of exacerbation of  psychotic symptoms.  Please refer to the admission note for further  details pertaining to the symptoms, circumstances, and history that led  to his hospitalization.  He was given an initial Axis I diagnosis of  schizophrenia, NOS.   MEDICAL AND LABORATORY:  The patient was medically and physically  assessed by the psychiatric nurse practitioner.  He came to [place] with a  history of hypothyroidism, gout, hypertension, GERD, seasonal allergies,  and anemia.  He was continued on his usual iron sulfate, multivitamin,  Claritin, Protonix, hydrochlorothiazide, Synthroid, Zyloprim, with  Colace and Benicar.  There were no acute medical issues.   HOSPITAL COURSE:  The patient was admitted to the adult inpatient  psychiatric service.  He presented as a well-nourished, normally-  developed, adult male who was disheveled, moderately confused and  disoriented, with disorganized thinking and delusionality.  He was  generally pleasant and cooperative and rather easygoing.  He was  continued on a regimen of Zyprexa, Trilafon, and lithium carbonate.   His medications were adjusted and he remained essentially absent any  acute behavioral symptoms throughout his stay.  We endeavored to  determine whether or not he would be able to return to his previous  group home.   They indicated to [place] that they were willing to accept him  back.  We invited representatives of that program to come and interview  him prior to making a decision about taking him back but they indicated  that they were not interested in doing this.  The patient appeared  appropriate for discharge on the 9th hospital day.   DISCHARGE AND AFTERCARE PLAN:  Patient was discharged with a plan to  follow up with [facility] to see their psychiatrist  on Apr 06, 2009, at 1 p.m.   DISCHARGE MEDICATIONS:  1. Zyprexa 20 mg q.h.s. and 10 mg q.a.m.  2. Trilafon increased to 8 mg t.i.d.  3. Lithium carbonate 900 mg daily.  4. Iron sulfate 325 mg daily.  5. Multivitamin with iron daily.  6. Claritin 10 mg daily.  7. Protonix 40 mg daily.  8. Hydrochlorothiazide 25 mg daily.  9. Synthroid 100 mcg daily.  10.Zyloprim 300 mg daily.  11.Colace 100 mg t.i.d.  12.Benicar 5 mg daily.   DISCHARGE DIAGNOSES:  AXIS I:  Bipolar disorder, not otherwise  specified.  AXIS II:  Deferred.  AXIS III:  History of gastroesophageal reflux disease,  hypertension,  hypothyroidism, gout, anemia.  AXIS IV:  Stressors, severe.  AXIS V:  Global Assessment of Functioning on discharge 45.      Norm Salt, MD  Electronically Signed     SPB/MEDQ  D:  03/27/2009  T:  03/27/2009  Job:  [id number]

## 2011-03-31 NOTE — Consult Note (Signed)
[name], [name]              ACCOUNT NO.:  [id number]   MEDICAL RECORD NO.:  [id number]          PATIENT TYPE:  INP   LOCATION:  1406                         FACILITY[facility]  [facility]   PHYSICIAN:  Lillette Boxer. Dahlstedt, M.D.DATE OF BIRTH:  30-May-1951   DATE OF CONSULTATION:  08/21/2008  DATE OF DISCHARGE:                                 CONSULTATION   REASON FOR CONSULTATION:  New onset of right hydronephrosis.   BRIEF HISTORY:  This 60 year old male with schizophrenia and mental  retardation was admitted to the hospital today for new onset of pain and  swelling in his right groin.  He carries a diagnosis of CLL.  He is  followed by Dr. Ralene Ok.  On this admission, he was found to have mild  renal insufficiency.  Creatinine was 1.65.  Apparently, it has been  normal in the past.  Estimated creatinine clearance was 44 mL per  minute.   On the 2nd of this month, CT of abdomen and pelvis was performed.  This  showed new onset of right hydroureteronephrosis.  The last scan that I  could see in the computer was done in March of 2008.   The patient denies any right flank pain.  He denies any gross hematuria.  He has a good stream, feels like he empties well.  There is no history  of kidney stones.   MEDICAL HISTORY:  Significant for:  1. A recent diagnosis of CLL.  2. He has schizophrenia.  3. History of DVT.  4. He has had an orthopedic procedure in the past as well as      tonsillectomy.   At the time of admission, his medications included:  1. Lithium.  2. Benztropine.  3. Prilosec.  4. Motrin.  5. Clonazepam.   No drug allergies.   Patient denies any abuse of tobacco, alcohol, or drugs.   EXAM:  Revealed a pleasant middle-aged male.  He was alert and oriented  and appropriate.  Blood pressure 101/48.  Pulse 85.  Temperature 97.6.  Respiratory rate 16.  HEENT:  Normocephalic, atraumatic.  Extraocular movements were intact.  Sclerae were anicteric.  HEART:  Regular rate  and rhythm.  No rubs, gallops, or murmurs.  LUNGS:  Clear bilaterally.  ABDOMEN:  Protuberant, soft, nondistended, nontender.  No mass, no  megaly.  Phallus was retracted in suprapubic fat.  Scrotal skin was normal.  Testicles were normal.  The right inner inguinal area was tender, warm,  and indurated.   LABORATORIES ON ADMISSION:  BMET was normal except for a creatinine of  1.7.  White count 18,600, hematocrit 32%, platelet count is 130,000.   Recent CT scans were reviewed.  This shows significant retroperitoneal  adenopathy.  There was right hydronephrosis.  There were enlarged  axillary nodes.  No splenomegaly.   IMPRESSION:  1. Fairly widespread chronic lymphocytic leukemia.  Recently      diagnosed..  This includes retroperitoneal adenopathy.  2. Right hydronephrosis.  Secondary to ureteral obstruction secondary      to retroperitoneal adenopathy.  3. Mild renal insufficiency, may be multifactorial.   PLAN:  1. I discussed the obstruction with the patient and the suggestion      that at some point in the near future he would benefit from a      cystoscopy and stent placement.  I drew him a picture of the      process in his retroperitoneum.  2. At some point, probably during the patient's hospitalization, we      will be able to proceed with cystoscopy and stent placement.  3. I will continue to follow patient.      Lillette Boxer. Dahlstedt, M.D.  Electronically Signed     SMD/MEDQ  D:  08/21/2008  T:  08/22/2008  Job:  [id number]   cc:   Jeanie Cooks, M.D.  Fax: [phone]

## 2011-03-31 NOTE — Discharge Summary (Signed)
[name], [name]              ACCOUNT NO.:  [id number]   MEDICAL RECORD NO.:  [id number]          PATIENT TYPE:  INP   LOCATION:  [id number]                         FACILITY:  [facility]   PHYSICIAN:  Cherene Altes, M.D.DATE OF BIRTH:  01-07-1951   DATE OF ADMISSION:  04/11/2009  DATE OF DISCHARGE:  04/22/2009                               DISCHARGE SUMMARY   ADDENDUM:   PRIMARY CARE PHYSICIAN:  Unassigned.   ONCOLOGIST:  Dr. Ralene Ok.   HAND SURGEON:  Dr. Caralyn Guile.   DISCHARGE DIAGNOSES:  1. Right hand deep space infection.      a.     Status post incision and drainage per Hand Surgery, Apr 14, 2009.      b.     Status post whirlpool and pulse lavage treatment during       hospital stay.      c.     Xeroform dressing to be applied to thumb and changed on a       daily basis.      d.     Follow up with Dr. Caralyn Guile within 5 days at [phone].      e.     Ongoing antibiotic therapy with vancomycin intravenous.  2. Recalcitrant right lower extremity cellulitis.      a.     Failed to improve after discontinuation of vancomycin.      b.     Marked improvement noted with resumption of vancomycin.      c.     Presumed methicillin-resistant Staphylococcus aureus       cellulitis.      d.     To complete 14-day course of intravenous vancomycin.  3. Bipolar disorder/schizophrenia - Presently controlled.  4. Hypertension.  5. Leukopenia.      a.     Low baseline white blood cell count initially.      b.     Questionable worsening secondary to nafcillin.      c.     Steadily improving after discontinuation of nafcillin.      d.     Recommendation to not use nafcillin in the future.  6. Hypothyroidism - Compensated.  7. Chronic lymphocytic leukemia - quiescent - followed by Dr.      Ralene Ok.  8. Gout - Controlled.   DISCHARGE MEDICATIONS:  1. Protonix 40 mg p.o. daily.  2. Senokot 1 tablet p.o. q.h.s.  3. Multivitamin one p.o. daily  4. Claritin 10 mg p.o. daily.  5. Iron  sulfate 325 mg p.o. daily.  6. Hydrochlorothiazide 25 mg p.o. daily.  7. Synthroid 100 mcg p.o. daily.  8. Allopurinol 300 mg p.o. daily.  9. Colace 100 mg p.o. t.i.d.  10.Benicar 5 mg p.o. daily.  11.Trilafon 8 mg p.o. t.i.d.  12.Zyprexa 10 mg p.o. q.a.m. and 20 mg p.o. q.h.s.  13.Lithium carbonate 300 mg p.o. daily and 600 mg p.o. q.h.s.  14.Ativan 1 mg p.o. every 6 hours.  15.Vancomycin 1250 mg IV every 12 hours with treatment to be      discontinued after final dose on  May 02, 2009 - Recommended to      follow trough levels.  16.Tylenol 650 mg p.o. every 4 hours p.r.n.   FOLLOW UP:  1. Ongoing care will be provided by the attending of record at the      patient's nursing home of choice.  It is recommended that a CBC be      reassessed within 5 to 7 days.  At discharge, the patient's white      count is improving at 2.4 up from 2.0 on the day previously.      Additionally, the patient's right lower extremity should be      monitored closely with ongoing vancomycin therapy to assure that it      continues to improve in regard to the erythema/cellulitis.  2. After arriving at the skilled nursing facility, Dr. Quincy Simmonds      office should be called at [phone] and arrangements should be made      for the patient to follow up with Dr. Caralyn Guile in his office within      5 days.  3. Wound care - Xeroform dressing is to be applied to the patient's      wound on a daily basis and changed daily.   PROCEDURES:  I and D of right hand/thumb deep space infection, Apr 14, 2009 - Dr. Iran Planas.   CONSULTATIONS:  1. Dr. Iran Planas with Hand Surgery.  2. Dr. Laurey Morale with Oncology.   HOSPITAL COURSE:  Please note this is an addendum to the previously  dictated discharge summary on patient [name].  For details  concerning the initial portion of the patient's hospitalization, please  see the previously dictated progress note as per Dr. Bubba Camp on April 16, 2009.  In  short, the patient was admitted to the hospital on Apr 11, 2009, with severe pain and swelling in the right hand and thumb that had  been present for approximately 3 days.  Ultimately, the patient was  diagnosed with a deep space infection in the right hand and thumb.  He  was placed on empiric antibiotic therapy.  The patient was taken to the  OR by Dr. Caralyn Guile and underwent successful incision and drainage.  In  the postoperative phase, cultures revealed abundant Staph aureus.  These  proved to be sensitive to vancomycin as well as multiple other agents.  The patient's antibiotics were transitioned to nafcillin in the  postoperative period due to proven susceptibility patterns.  Unfortunately, with this change, the patient's white count began to  decline.  There was concern that this leukopenia was a result of  nafcillin which has been infrequently implicated in significant  leukopenia.  Nafcillin was discontinued and clindamycin was initiated.  The patient had been previously on vancomycin empirically.  With change  from nafcillin, the patient's white count began to steadily rise.  On  the date of this dictation, the patient's white count is 2.4 which is a  significant improvement.  It is recommended that nafcillin not be used  in this patient in the future.  Despite that fact, the patient's hand  continued to improve as attested to by Dr. Caralyn Guile and followup  evaluations.  The patient then began to develop significant erythema of  the right lower extremity.  This had been noted at admission but was  improving with IV vancomycin.  With the transition of the patient's  antibiotic to nafcillin and then clindamycin, this erythema quickly  returned.  Venous Dopplers were obtained to rule out DVT and were in  fact negative.  There was concern on clinical grounds therefore that  this represented an MRSA cellulitis.  Antibiotics were then changed back  to vancomycin.  With ongoing vancomycin  use, the erythema in the  patient's right lower extremity significantly improved.  Vancomycin  should be effective in treating the patient's right hand deep space  infection as well.   At the present time, the patient is medically stable.  He is afebrile  and vital signs are stable.  Dr. Ralene Ok did see the patient during his  hospital stay and felt that the patient's CLL remained quiescent.  Followup issues are as above.  Wound care is as above.  It is  anticipated the patient will be ready for discharge to a skilled nursing  facility on April 22, 2009.      Cherene Altes, M.D.  Electronically Signed     JTM/MEDQ  D:  04/21/2009  T:  04/21/2009  Job:  [id number]   cc:   Jeanie Cooks, M.D.  Fax: [place], MD  Fax: [phone]

## 2011-03-31 NOTE — Discharge Summary (Signed)
[name], [name]              ACCOUNT NO.:  [id number]   MEDICAL RECORD NO.:  [id number]          PATIENT TYPE:  IPS   LOCATION:  0401                          [facility]  [facility]   PHYSICIAN:  Norm Salt, MD  DATE OF BIRTH:  1951-10-14   DATE OF ADMISSION:  12/25/2008  DATE OF DISCHARGE:  12/28/2008                               DISCHARGE SUMMARY   IDENTIFYING DATA AND REASON FOR ADMISSION:  This was an inpatient  psychiatric admission for [name], a 60 year old unmarried Caucasian male  with a history of chronic schizophrenia.  He was admitted due to acute  exacerbation.  Please refer to the admission note for further details  pertaining to the symptoms, circumstances and history that led to his  hospitalization.  He was given an initial Axis I diagnosis of  schizophrenia, chronic paranoid type, acute exacerbation.   MEDICAL AND LABORATORY:  The patient was medically and physically  assessed by the psychiatric nurse practitioner.  He came to [place] with a  history of hypertension, hypothyroidism, GERD, and gout.  He was  continued on his usual Avelox, Benicar, hydrochlorothiazide, Synthroid,  Colace, Prilosec, and Zyloprim.  There were no significant medical  issues during this stay.   HOSPITAL COURSE:  The patient was admitted to the adult inpatient  psychiatric service.  He presented as a well-nourished, normally-  developed adult male with very poor grooming and ADLs.  His thoughts  were illogical, poorly organized, and tangential.  He came to [place] after  having presented to the emergency department was some medical symptoms,  where it was realized that he was highly delusional.  He stated that he  did not know why he was admitted here.  His insight and judgment  appeared to nil.   We were able to learn more information about him.  He had been living at  the [name].  We learned that he had been on a regimen of  Zoloft and lithium carbonate.  These were  restarted.   Our case manager was in touch with [name], and they indicated that  they would take him back once he was appropriately stabilized.   The patient appeared to be appropriate for discharge back to [name] on  the fourth hospital day.  Following aftercare plan was arranged.   AFTERCARE:  The patient was to follow-up at [name] with an appointment that was to be arranged at that time of this  dictation.   DISCHARGE MEDICATIONS:  1. Avelox 400 mg at 6:00 p.m. x3 days, then discontinue.  2. Benicar 40 mg q.a.m.  3. Hydrochlorothiazide 25 mg daily.  4. Synthroid 100 mcg daily.  5. Zoloft 50 mg daily.  6. Colace 100 mg t.i.d.  7. Prilosec 20 mg daily.  8. Lithium carbonate 300 mg q.a.m. and 600 mg nightly.  9. Zyloprim 300 mg daily.   DISCHARGE DIAGNOSES:  AXIS I:  Schizophrenia, chronic paranoid type,  acute exacerbation, resolving, versus schizoaffective disorder.  AXIS II:  Deferred.  AXIS III:  History of hypertension, GERD, gout, hypothyroidism,  hypertension.  AXIS IV:  Stressors severe.  AXIS V:  Global Assessment of Functioning on discharge 50.      Norm Salt, MD  Electronically Signed     SPB/MEDQ  D:  12/31/2008  T:  12/31/2008  Job:  [id number]

## 2011-03-31 NOTE — Consult Note (Signed)
NAMEGURJIT, SIGNOR              ACCOUNT NO.:  [id number]   MEDICAL RECORD NO.:  [id number]          PATIENT TYPE:  INP   LOCATION:  51                         FACILITY:  [facility]   PHYSICIAN:  Felizardo Hoffmann, M.D.  DATE OF BIRTH:  1950-12-12   DATE OF CONSULTATION:  08/22/2008  DATE OF DISCHARGE:                                 CONSULTATION   REASON FOR CONSULTATION:  Potential adverse medication effects.  Ongoing  psychotropic medication management.   REQUESTING PHYSICIAN:  [facility].   HISTORY OF PRESENT ILLNESS:  [name] is a 60 year old male  admitted to the [facility] on August 21, 2008, due to  cellulitis of the right thigh and syncope.  He has developed an  increased white count.  However, there has been no evidence of  agranulocytosis.  Also he has been followed by oncology for his CLL.   [name] has continued on his maintenance Clozaril and his lithium,  please see his list of medications below.  He does have mild decreased  energy, however, there is no depressed mood or anhedonia.  He does have  intact hope.  He has no thoughts of harming himself or others.  There  are currently no hallucinations.  However, he still does have some very  mild ideas of reference.  When mentioning a trial of medication he  immediately has an idea of reference as if he is going to be going to a  court trial and he continues to talk about this fear for a few minutes  until he can be comforted with the ego support.   He does have some vague speech.  He has an intact appetite.  He does not  have any agitation or combativeness.  He is cooperative with care.   PAST PSYCHIATRIC HISTORY:  [name] does have a history of psychosis  in the past.  He also has a number of psychiatric admissions.  In review  of the past medical record [name] has been admitted to [facility]  He also was admitted to the [facility]  voluntarily in September of 2003.  At that time he was on lithium  600 mg per day along with Topamax 50 mg b.i.d.  He also was on Clozaril  at that time, 200 mg q. a.m. and 400 mg q.h.s.  [name] does have a  history of suicide attempt with a diagnosis of schizophrenia in the  past.   In review of the past psychotropic agents with the patient that he has  been tried on he lists Navane, Thorazine, Haldol and Stelazine.   FAMILY PSYCHIATRIC HISTORY:  None known.   SOCIAL HISTORY:  [name] is single.  He does not use alcohol or  illegal drugs.  He does have a daughter.  He has been residing at the  [facility].  He is unemployed and medically disabled.   PAST MEDICAL HISTORY:  Right groin swelling, cellulitis, history of CLL.   MEDICATIONS:  1. MAR is reviewed.  [name] is on lithium  300 mg in the morning      and 600 mg at night.  2. Cogentin 1 mg q.h.s.  3. Clozaril 200 mg q. a.m. and 300 mg q.h.s.  4. He also is on Decadron 40 mg IV daily.  5. Synthroid 115 mcg daily.   ALLERGIES:  No known drug allergies.   Head CT without contrast shows no acute abnormalities.   LABORATORY DATA:  Sodium 140, potassium 4.4, BUN 13, creatinine 1.27,  glucose 107.  WBC 22.5, hemoglobin 10.3, platelet count 144, SGOT 13,  SGPT less than 8.  INR, phosphorus, magnesium, TSH all within normal  limits.  Lithium level is in a good range at 0.87.   REVIEW OF SYSTEMS:  Constitutional, head, eyes, ears, nose, throat,  mouth, neurologic, psychiatric, cardiovascular, respiratory,  gastrointestinal, genitourinary, skin, musculoskeletal, hematologic,  lymphatic, endocrine and metabolic all unremarkable.   PHYSICAL EXAMINATION:  VITAL SIGNS:  Temperature 97.8, pulse 85,  respiratory rate 16, blood pressure 114/67, O2 saturation on room air  98%.  GENERAL APPEARANCE:  [name] is a middle-aged male sitting up in his  hospital bed with no abnormal involuntary movements.  MENTAL  STATUS EXAM:  [name] eye contact is intermittent.  His  attention span is normal.  Concentration is normal.  His affect is flat.  His mood is within normal limits.  He is oriented to all spheres.  His  memory is intact to immediate, recent and remote.  His fund of knowledge  and intelligence are grossly within normal limits.  His speech is mildly  flat in prosody.  There is no dysarthria.  Thought process is vague at  times with a little bit of illogia.  There are no looseness of  associations.  Thought content - please see the history of present  illness regarding the idea of reference.  This was a brief period and  responded quickly to ego support.  There was no persistence in a  delusional form.  He has no hallucinations.  There are no thoughts of  harming himself or others.  Insight is partial.  Judgment is overall  impaired for living alone, however, intact for cooperating with  institutional care.   ASSESSMENT:  AXIS I:  293.81, psychotic disorder not otherwise  specified.  This appears to be stable.  He may have a history of 295.70,  schizoaffective disorder.  This also does appear to be stable.  There is  some residual decreased energy, however, this can be attributed to his  general medical problems.  AXIS II:  None.  AXIS III:  See past medical history.  AXIS IV:  General medical.  AXIS V:  50.   [name] is not at risk to harm himself or others.  He agrees to call  emergency services immediately for any thoughts of harming himself,  thoughts of harming others or mental distress.   The patient's medications were reviewed with them.  He wants to proceed  as below.   Would continue the patient's Clozaril given his history of multiple  other antipsychotic trials.  Would not change the dosage.  Would  continue to monitor his CBC every 1-2 weeks as is standard for  monitoring for Clozaril adverse effects.   No change in his lithium.  His Cogentin is evidently  utilized for anti-  EPS at 1 mg q.h.s.  Since there are no adverse effects would continue  this as well.   Would ask the case manager/social worker to have him  set up with his  outpatient psychiatrist during the first week of discharge.      Felizardo Hoffmann, M.D.  Electronically Signed     JW/MEDQ  D:  08/23/2008  T:  08/23/2008  Job:  [id number]

## 2011-03-31 NOTE — Group Therapy Note (Signed)
NAMESHEIKH, HOMEWOOD              ACCOUNT NO.:  [id number]   MEDICAL RECORD NO.:  [id number]          PATIENT TYPE:  INP   LOCATION:  [id number]                         [facility]ACILITY:  [facility]   PHYSICIAN:  Adele Barthel, MD    DATE OF BIRTH:  26-Dec-1950                                 PROGRESS NOTE   PRIMARY CARE PHYSICIAN:  Unassigned.   PRIMARY ONCOLOGIST:  Jeanie Cooks, M.D.   DATE OF DISCHARGE:  To be determined by the discharging physician.   DISCHARGE DIAGNOSES:  Cellulitis of right upper and lower extremities status post incision and  drainage.  Wound cultures positive for Methicillin-sensitive  Staphylococcus aureus   SECONDARY DIAGNOSES:  1. Schizophrenia.  2. Hypertension.  3. Hypothyroidism.  4. Gout.  5. Anemia.  6. Chronic lymphocytic leukemia.  The patient is receiving      chemotherapy.  7. Deep vein thrombosis.  8. Hypertension.   DISCHARGE MEDICATIONS:  To be determined by the discharging physician.   HISTORY OF PRESENT ILLNESS:  Please refer to the dictated admission note.   HOSPITAL COURSE:  1. Cellulitis of right upper and right lower extremities status post I      and D followed by hand surgeon.  The patient was admitted with      right hand swelling and he was started on IV vancomycin and Zosyn      and was consulted to hand surgeon for incision and drainage.  The      pus was sent and it was positive for MSSA.  Operative report on May      30,   Dictation ends here.  Dictator requests it to be deleted.      Adele Barthel, MD  Electronically Signed     NP/MEDQ  D:  04/16/2009  T:  04/16/2009  Job:  [id number]

## 2011-03-31 NOTE — H&P (Signed)
[name], [name]              ACCOUNT NO.:  [id number]   MEDICAL RECORD NO.:  [id number]          PATIENT TYPE:  INP   LOCATION:  1402                         FACILITY:  [facility]   PHYSICIAN:  Jana Hakim, M.D. DATE OF BIRTH:  1951-07-18   DATE OF ADMISSION:  12/18/2008  DATE OF DISCHARGE:                              HISTORY & PHYSICAL   PRIMARY CARE PHYSICIAN:  Unassigned.   CHIEF COMPLAINT:  Cough and shortness of breath.   HISTORY OF PRESENT ILLNESS:  This is a 60 year old male who resides at  the [facility] group home facility who was sent to  the emergency department secondary to worsening shortness of breath and  cough.  The patient reports having symptoms for approximately 1 week  with worsening shortness of breath and cough which has been productive  of yellow to greenish phlegm.  The patient reports having chest  congestion.  Per the medical records, the patient had reported having  symptoms for approximately 3 weeks.   PAST MEDICAL HISTORY:  1. 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  His last chemotherapy was December 03, 2008.  He was evaluated 1 day ago by his oncologist, Dr. Ralene Ok,      for chemotherapy; however, chemotherapy was postponed secondary to      low platelets which at that time were 85.  2. History of mental retardation.  3. Schizophrenia.  4. Hypothyroidism.  5. Previous deep venous thrombosis.   MEDICATIONS:  1. Levofloxacin.  2. Ambien.  3. Dexamethasone.  4. Zyprexa.  5. Benztropine.  6. Clozapine.  7. Lithium carbonate.  8. Omeprazole.  9. Motrin.   ALLERGIES:  NO KNOWN DRUG ALLERGIES.   SOCIAL HISTORY:  The patient resides in a group home.  He is a  nonsmoker, nondrinker and no history of illicit drug  usage.   FAMILY HISTORY:  Noncontributory.   REVIEW OF SYSTEMS:  The patient has not had any nausea, vomiting,  diarrhea or chest pain.  All other pertinent positives are mentioned  above.   PHYSICAL EXAMINATION FINDINGS:  GENERAL:  This is a 60 year old obese,  well-developed male in discomfort but no acute distress.  VITAL SIGNS:  Temperature of 100.0, blood pressure initially 137/45, now  115/50, heart rate 118, respirations 20, O2 saturations 91-92%.  HEENT:  Normocephalic, atraumatic.  There is no scleral icterus.  Pupils  are equally round, reactive to light.  Extraocular movements are intact.  Funduscopic benign.  Oropharynx is clear.  NECK:  Supple, full range of motion.  No thyromegaly, adenopathy or  jugular venous distention.  CARDIOVASCULAR:  Tachycardiac rate and rhythm.  No murmurs, gallops or  rubs.  LUNGS:  Diffuse rhonchi throughout.  No rales, no wheezes.  ABDOMEN:  Positive bowel sounds, soft, nontender, nondistended.  EXTREMITIES:  Without cyanosis, clubbing or edema.  NEUROLOGIC:  The patient  is alert and oriented x3.  There are no focal  deficits on examination.   LABORATORY STUDIES:  White blood cell count 17.0, hemoglobin 10.6,  hematocrit 31.7, platelets 92, neutrophils 92%, lymphocytes 2%.  Sodium  136, potassium 4.4, chloride 102, carbon dioxide 24, BUN 17, creatinine  1.43 and glucose 131.  Chest x-ray reveals anterior right upper lobe or  right middle lobe opacity consistent with pneumonia with possible  involvement of the right lower lobe, as well.   ASSESSMENT:  A 60 year old male being admitted with:  1. Shortness of breath.  2. Pneumonia.  3. Normocytic anemia.  4. Thrombocytopenia, possibly secondary to chemotherapy treatments.  5. Non-Hodgkin's lymphoma.  6. History of chronic lymphocytic leukemia.   PLAN:  The patient will be admitted and placed on IV antibiotic therapy  of Avelox along with nebulizer treatments and supplemental oxygen   therapy.  The patient's regular medications will be further verified.  He will also be placed on DVT and GI prophylaxis.      Jana Hakim, M.D.  Electronically Signed     HJ/MEDQ  D:  12/19/2008  T:  12/19/2008  Job:  [id number]

## 2011-03-31 NOTE — H&P (Signed)
[name], [name]              ACCOUNT NO.:  [id number]   MEDICAL RECORD NO.:  [id number]          PATIENT TYPE:  INP   LOCATION:  0109                         FACILITY:  [facility]   PHYSICIAN:  Todd Barthel, MD    DATE OF BIRTH:  1951-03-21   DATE OF ADMISSION:  04/11/2009  DATE OF DISCHARGE:                              HISTORY & PHYSICAL   PRIMARY CARE PHYSICIAN:  Unassigned.   PRIMARY ONCOLOGIST:  Dr. Ralene Ok.   CHIEF COMPLAINT:  The patient is having pain and swelling over right  thumb for the past 3 days.   HISTORY OF PRESENT ILLNESS:  The patient has a known history of  hypertension, gout, hypothyroidism, anemia and schizophrenia as per  previous records.  According to him, he got a nail prick on Monday  evening on right hand thumb.  Since then, he has noticed growing,  swelling, redness and pain now extending to his right forearm.   PAST MEDICAL HISTORY:  1. Gastroesophageal reflux disease.  2. Hypertension.  3. Hypothyroidism.  4. Gout.  5. Anemia.  6. Schizophrenia.  7. Right groin cellulitis in 2009.  8. Chronic lymphocytic leukemia on chemotherapy.  Two rounds of      chemotherapy followed by his oncologist.  9. Deep venous thrombosis.   PAST SURGICAL HISTORY:  1. Tonsillectomy.  2. Some orthopedic surgery.   FAMILY HISTORY:  Unobtainable.   SOCIAL HISTORY:  Lives in group home, [facility].  Denies  alcohol, smoking or illicit drug use.   ALLERGIES:  NO KNOWN DRUG ALLERGIES.   MEDICATIONS:  As per last discharge on Mar 27, 2009;  1. Zyprexa 20 mg nightly and 10 mg q.a.m.  2. Trilafon 8 mg t.i.d.  3. Lithium carbonate 900 mg daily.  4. Iron sulfate 325 mg daily.  5. Multivitamins with iron daily.  6. Claritin 10 mg daily.  7. Protonix 40 mg daily.  8. Hydrochlorothiazide 25 mg daily.  9. Synthroid 100 mcg daily.  10.Zyloprim 300 mg daily.  11.Colace 100 mg t.i.d.  12.Benicar 5 mg daily.   REVIEW OF SYSTEMS:  Negative except for  bruise over right arm.  According to him, he fell down from the bed on Tuesday and got this  bruise.  Otherwise, negative for fever, cough, chills, or bowel or  bladder dysfunctions.   PHYSICAL EXAMINATION:  VITAL SIGNS:  Temperature is 98.8, pulse is 88  per minute, respirations 20 per minute, blood pressure 106/55 mmHg.  GENERAL:  No icterus or cyanosis.  Hydration fair.  HEENT:  Atraumatic, normocephalic head.  NECK:  No jugulovenous distention.  No lymphadenopathy.  LUNGS:  Clear bilaterally.  Air entry normal.  No rhonchi.  HEART:  S1-S2 normal.  No murmur, rubs or gallops.  ABDOMEN:  Soft, nondistended, nontender.  No organomegaly.  Bowel sounds  are present.  EXTREMITIES:  No pedal edema.  Pulses palpable bilaterally.  SKIN:  Bruise on right upper arm.  On examination of the right hand with  which complaint the patient came, it shows swelling, redness and  tenderness.  The redness extends to his forearm. Mild redness  of right  lower extremity present.  NEUROLOGIC:  The patient is oriented x3.  Follows three-step commands.  Motor and sensory intact.  Speech is Intact, but variable thought  process, changing from one topic to another.  Gait is broad-based.  Intentional tremor is present.   LABORATORY DATA AND X-RAYS:  X-ray of the right hand ordered showed  extensive dorsal soft tissue swelling without osteomyelitis.  No foreign  body seen on x-ray.  WBC 3.96, which is decreased.  Hemoglobin 10.9,  decreased.  Hematocrit 32.6.  Platelets 128.  RDW 16.3, random glucose  113.  Monocytes 18, which are increased.  Lymphocytes 7, which is  decreased.  Sodium 136, potassium 4, chloride 105, bicarb 27, BUN 14,  creatinine 1.4.  X-ray of right hand showed extensive dorsal soft tissue  swelling without evidence of osteomyelitis, negative for foreign body.   ASSESSMENT/PLAN:  1. Cellulitis of the right hand extending to right forearm.  We will start the patient on IV vancomycin and  Zosyn. Blood and wound  culture is requested.  Consulted Copy.  Adjust antibiotics  according to the blood cultures.  Tetanus vaccination.  1. Schizophrenia.  Continue the home ongoing medications.  Continue      the psychotropic medications for the schizophrenia.  2. Hypothyroidism.  Continue the ongoing Synthroid medication, home      doses of Synthroid.  3. Chronic lymphocytic leukemia.  Follows up with Dr Ralene Ok.  4. Deep venous thrombosis prophylaxis.  We will give sequential      compression devices and TEDs.  5. Gastrointestinal prophylaxis.  We will give proton pump inhibitors.   Total time spent on this patient 1-hour.      Todd Barthel, MD  Electronically Signed     NP/MEDQ  D:  04/11/2009  T:  04/11/2009  Job:  [id number]   cc:   Jeanie Cooks, M.D.  Fax: [phone]

## 2011-03-31 NOTE — Group Therapy Note (Signed)
[name], [name]              ACCOUNT NO.:  [id number]   MEDICAL RECORD NO.:  [id number]          PATIENT TYPE:  INP   LOCATION:  [id number]                         FACILITY:  [facility]   PHYSICIAN:  Adele Barthel, MD    DATE OF BIRTH:  1951/04/26                                 PROGRESS NOTE   PRIMARY CARE PHYSICIAN:  Undetermined.   PRIMARY ONCOLOGIST:  Jeanie Cooks, M.D.   DISCHARGE DIAGNOSIS:  Cellulitis of right upper and lower extremities status post I and D.  Micro shows methicillin-sensitive Staphylococcus aureus.   SECONDARY DIAGNOSES:  1. Schizophrenia, condition stable.  2. Gastroesophageal reflux disease.  3. Hypertension.  4. Hypothyroidism.  5. Gout.  6. Anemia.  7. Chronic lymphocytic leukemia.  8. Deep venous thrombosis.   DISCHARGE MEDICATIONS:  To be determined by discharging physician.   HISTORY OF PRESENT ILLNESS:  Please refer to the admission note dictated by Dr. Bubba Camp on Apr 11, 2009 under history of present illness.   HOSPITAL COURSE:  1. Cellulitis of right upper and lower extremities.  The patient came      to the emergency with a complaint of swelling of her right thumb.      The patient was started on IV antibiotics, vancomycin and Zosyn and      surgeon was consulted.  On the 28th, the patient went for operative      surgery for the hand and incision and drainage was done.  The      culture was sent and the report came back positive for methicillin-      sensitive Staphylococcus aureus so the antibiotic is now changed to      nafcillin.  Repeat I and D was Apr 14, 2009.  The patient is doing      well after the surgery under PT and OT.  The patient is currently      on hydrotherapy for wound care.  This issue is resolving.  2. Schizophrenia.  The patient looked stable.  For it, continue the      home medication.  3. Hypothyroidism.  Continue the home medications.  4. Hypertension which is under control.   Imaging study done on May  27, x-ray of the right hand showed no evidence  of osteomyelitis.  It showed extensive dorsal soft tissue swelling and  consultation for hand surgery done on May 27.  The patient followed by  hand surgery and consultation performed by Dr. Iran Planas.   PROCEDURE DONE:  I and D for hand cellulitis on May 28 and May 30.   DISPOSITION:  Awaiting Social Work consult for transfer patient to skilled nursing  facility.  PT and OT to determine disposition and for rehabilitation  evaluation.   Time spent in discharge 20 minutes.      Adele Barthel, MD  Electronically Signed     NP/MEDQ  D:  04/16/2009  T:  04/16/2009  Job:  [id number]

## 2011-03-31 NOTE — Discharge Summary (Signed)
[name], FILA              ACCOUNT NO.:  [id number]   MEDICAL RECORD NO.:  [id number]          PATIENT TYPE:  INP   LOCATION:  1402                         FACILITY:  [facility]   PHYSICIAN:  Sherryl Manges, M.D.  DATE OF BIRTH:  03-Aug-1951   DATE OF ADMISSION:  12/18/2008  DATE OF DISCHARGE:  12/24/2008                               DISCHARGE SUMMARY   PRIMARY CARE PHYSICIAN:  Unassigned.   PRIMARY HEMATOLOGIST/ONCOLOGIST:  Jeanie Cooks, M.D.   DISCHARGE DIAGNOSES:  1. Pneumonia, community acquired.  2. Large B-cell non-Hodgkin lymphoma/CLL, on chemotherapy.  3. Bicytopenia, secondary to #2 above.  4. History of mental retardation.  5. Schizoaffective disorder/psychosis.  6. Hypothyroidism.  7. Previous history of DVT.   DISCHARGE MEDICATIONS:  1. Avelox 400 mg p.o. daily for 7 days only.  2. Mucinex 600 mg p.o. b.i.d. for 7 days only.  3. Benicar 40 mg p.o. daily.  4. Hydrochlorothiazide 25 mg p.o. daily.  5. Multivitamin one p.o. daily.  6. Synthroid 100 mcg p.o. daily (was on 150 mcg p.o. daily).  7. Zoloft 50 mg p.o. daily.  8. Colace 100 mg p.o. t.i.d.  9. Omeprazole 20 mg p.o. daily.  10.Lithium carbonate 300 mg p.o. daily and 600 mg p.o. q.h.s.  11.Allopurinol 300 mg p.o. daily.   Note:  Valtrex, Septra, Motrin, Benztropine, Tylenol, Prochlorperazine,  Clozaril, have all been discontinued.   PROCEDURES:  1. Chest x-ray dated December 18, 2008.  This showed anterior right      upper lobe or right middle lobe opacity consistent with pneumonia      with possible involvement of right lower lobe as well.  2. Chest x-ray dated December 20, 2008.  This showed decreased lung      volumes with increasing bibasilar airspace disease suggesting      pneumonia.  There was also an increase in pulmonary vascular      congestion.  3. Chest x-ray dated December 21, 2008.  This showed basilar air space      disease compatible with pneumonia and aeration of right lung  bases      improve compared to December 20, 2008.  4. Chest x-ray dated December 22, 2008, improved left lower lobe      aeration which could represent resolving atelectasis, patchy right      lower lobe airspace opacities noted.   CONSULTATIONS:  1. Dr. Felizardo Hoffmann, psychiatrist.  2. Dr. Laurey Morale, oncologist.   ADMISSION HISTORY:  As in H&P notes of December 18, 2008, dictated by Dr.  Jana Hakim. However, in brief, this is a 60 year old male, with  known history of chronic lymphocytic leukemia diagnosed 10/2003 and  transformed large B-cell non-Hodgkin lymphoma diagnosed 08/2008 status  post four cycles of chemotherapy per CHOD-R program, mental retardation,  schizoaffective disorder, hypothyroidism, previous history of deep vein  thrombosis, presenting with a one week history of increasing shortness  of breath and cough productive of greenish phlegm.  On initial  evaluation, he was found to have temperature of 100.0.  O2 saturations  were 91-92%.  Chest x-ray demonstrated findings consistent with a  right  upper lobe/right lower lobe pneumonia.  He was admitted for further  evaluation and investigation management.   CLINICAL COURSE:  1. Right lung pneumonia, community acquired.  For details of      presentation, refer to admission history above.  The patient was      commenced on intravenous Avelox as well as oxygen supplementation,      bronchodilator nebulizer treatment.  White cell count at the time      of presentation was 17.0, but during the course of this      hospitalization, this showed steady diminution and was normalized      at 7.7 on December 24, 2008.  During the course of this      hospitalization, the patient had no further episodes of pyrexia.      Physical well-being improved, and by December 24, 2008, cough was      mild, only intermittent and phlegm was clear.  Clearly the patient      has improved clinically.  This was also borne out by serial chest  x-      rays which were done during the course of this hospitalization.  On      December 24, 2008, the patient had recovered sufficiently to be      transitioned to oral Avelox to complete a further 7 days of      antibiotic therapy.   1. Lymphoma/leukemia.  As outlined above, the patient does have a      known history of chronic lymphocytic leukemia diagnosed 10/2003 and      transformed large B-cell non-Hodgkin lymphoma diagnosed 08/2008.      He had been managed by Dr. Laurey Morale, hematologist/oncologist,      and has undergone CHOD program with Rituxan.  His last treatment      was on December 03, 2008.  The plan apparently, is to carry out the      further fifth cycle of chemotherapy from December 26, 2008,      provided his blood count was satisfactory.  In addition, Dr.      Ralene Ok is planning to set the patient up with Dr. Lucia Gaskins at      [facility], with regard to      possible high-dose therapy and stem cell rescue.  The patient was      kindly seen by Dr. Ralene Ok during the course of this      hospitalization, and will follow up with him, following discharge.   1. Psychiatric problems.  The patient is a known case of mental      retardation and schizoaffective disorder.  He was seen by Dr.      Rhona Raider, psychiatrist, who was called in consultation because      patient has had delusions for approximately 2 weeks.  For details      of that consultation, refer to consultation notes of December 20, 2008.  Dr. 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  He has recommended that in view of exacerbation of      psychosis, and recommended discontinuation of Clozaril,      continuation of Lithium in a dose of 900 mg daily and utilization      of p.r.n. IM Zyprexa, in a dose of 10 mg at 1600 p.m. for  antipsychosis.  Per Dr. Rhona Raider, the patient  will likely benefit      from further management in the inpatient [place].   1. Dysthyroidism.  The patient is a known case of hypothyroidism, and      was on 150 mcg of Synthroid pre-admission.  His TSH during this      hospitalization was depressed at 0.318.  The patient's Synthroid      has therefore been reduced to 100 mcg p.o. daily.  He certainly      will benefit from repeat TSH in 4-6 weeks.   DISPOSITION:  The patient was as of December 24, 2008, practically  asymptomatic, ambulating, eating well, felt considerably better, apart  from his psychiatric issues.  He has continued to ramble, exhibit  delusional thoughts, etc.  He was considered stable from medical  viewpoint, for discharge to the [facility] for specific  management.  He did have elevated BP during the course of  hospitalization, but this was readily addressed with Benicar and  Hydrochlorothiazide.   DISCHARGE INSTRUCTIONS:  1. Diet:  Heart-healthy.  2. Activities:  Recommended to increase activity slowly.   FOLLOW-UP INSTRUCTIONS:  The patient is to follow up with his primary  hematologist/oncologist, Dr. Laurey Morale, at the date to be determined.  Telephone number [phone].  Further follow-up arrangements will be  determined by psychiatrist, following discharge.      Sherryl Manges, M.D.  Electronically Signed     CO/MEDQ  D:  12/24/2008  T:  12/24/2008  Job:  [id number]   cc:   Jeanie Cooks, M.D.  Fax: [phone]

## 2011-03-31 NOTE — Discharge Summary (Signed)
[name], [name]              ACCOUNT NO.:  [id number]   MEDICAL RECORD NO.:  [id number]          PATIENT TYPE:  INP   LOCATION:  [id number]                         FACILITY:  [facility]   PHYSICIAN:  Jacquelynn Cree, M.D.   DATE OF BIRTH:  1951-02-22   DATE OF ADMISSION:  08/21/2008  DATE OF DISCHARGE:  08/25/2008                               DISCHARGE SUMMARY   PRIMARY CARE PHYSICIAN:  Unassigned.   ONCOLOGIST:  Jeanie Cooks, M.D.   DISCHARGE DIAGNOSES:  1. Right groin mass, pathology pending.  2. Chronic lymphocytic leukemia status post chemotherapy.  3. Chronic renal insufficiency.  4. Schizophrenia.  5. Hypothyroidism.  6. Right hydronephrosis status post urological consultation.  7. History of deep vein thrombosis.  8. Splenomegaly.   DISCHARGE MEDICATIONS:  1. Motrin 800 mg daily.  2. Prilosec 40 mg daily.  3. Benztropine 1 mg daily.  4. Lithium carbonate 300 mg q.a.m., 600 mg every noon and 6 p.m.  5. Clozapine 100 mg in morning, 300 mg in the evening.  6. Levothyroxine 150 mg nightly.  7. Bactrim DS 1 tablet daily.  8. Valtrex 500 mg daily.  9. Doxycycline 100 mg b.i.d. x 1 week.   CONSULTATIONS:  1. Felizardo Hoffmann, M.D. of Psychiatry.  2. Jeanie Cooks, M.D. of Oncology.  Maltby Dahlstedt, M.D. of Urology.   BRIEF ADMISSION HISTORY OF PRESENT ILLNESS:  The patient is a 60-year-  old male with CLL who presents to the hospital for evaluation of right  groin pain, swelling, induration and erythema.  There was some concern  that the patient's swelling was due to cellulitis and abscess versus  lymphoma, and therefore the patient was admitted for further evaluation  and workup.  For the full details, please see the dictated report done  by Dr. Tyrone Schimke.   PROCEDURES AND DIAGNOSTIC STUDIES:  1. Chest x-ray on August 21, 2008, showed no acute infiltrate or      edema.  Mild right paratracheal soft tissue prominence thought to      be secondary to  adenopathy.  Trace right basilar atelectasis.  2. CT scan of the head on August 21, 2008, showed no acute      intracranial abnormality.  Partial visualization of lymphadenopathy      in the neck.  3. Renal ultrasound on August 22, 2008 showed no acute genitourinary      pathology other than a stable amount of right perinephric fluid,      which is nonspecific.  Splenomegaly was demonstrated.  4. Ultrasound-guided biopsy of right groin mass on August 22, 2008.      Pathology is pending.   DISCHARGE LABORATORY VALUES:  Sodium was 141, potassium 5.6, chloride  112, bicarb 26, BUN 24, creatinine 1.37, glucose 102.  White blood cell  count 27.6, hemoglobin 7.6, hematocrit 32.4, platelets 188.  Uric acid  is 4.5, LDH 144, phosphorus 4.6.   HOSPITAL COURSE:  1. Right groin mass.  The patient underwent ultrasound-guided biopsy      and pathology is still pending.  The size of the mass has  diminished somewhat and the patient has had 3 days of chemotherapy.      It is most likely a lymphoma, but cellulitis could not be entirely      excluded and the patient was empirically put on vancomycin.  He      will complete an additional 7 days of treatment with doxycycline.  2. Chronic lymphocytic leukemia:  The patient underwent chemotherapy      at the direction of Dr. Ralene Ok.  He was then put on Neupogen post      chemotherapy, and will need to have this continued as an      outpatient.  3. Chronic renal insufficiency.  The patient's creatinine has remained      relatively stable throughout his hospital stay.  4. Schizophrenia:  The patient was continued on his psychoactive      medications and his  mood was stable.  5. Hypothyroidism.  The patient was appropriately replaced on his      Synthroid.  TSH value was checked and found to be within normal      limits.  6. Right hydronephrosis.  The patient was seen in consultation with      Dr. Diona Fanti who indicated that a stent may inevitably  be      necessary, but did not think there was any acute need.  He will      follow him as an outpatient if needed.  At this point, Dr. Ralene Ok      thinks that the chemotherapy will shrink the tumor burden enough      that he should not have ongoing problems with hydronephrosis.  7. Splenomegaly.  The patient was advised at discharge to avoid      lifting heavy objects and contact sports.   DISPOSITION:  The patient is medically stable and has been cleared for  discharge by Dr. Ralene Ok.  He will follow up with Dr. Ralene Ok and Dr.  Diona Fanti if needed.   Time spent coordinating care for discharge and discharge instructions  equals 35 minutes.      Jacquelynn Cree, M.D.  Electronically Signed     CR/MEDQ  D:  08/25/2008  T:  08/26/2008  Job:  [id number]   cc:   Jeanie Cooks, M.D.  Fax: [phone]

## 2011-03-31 NOTE — Consult Note (Signed)
[name], [name]              ACCOUNT NO.:  [id number]   MEDICAL RECORD NO.:  [id number]          PATIENT TYPE:  INP   LOCATION:  1318                         FACILITY:  [facility]   PHYSICIAN:  Felizardo Hoffmann, M.D.  DATE OF BIRTH:  [birth year]   DATE OF CONSULTATION:  DATE OF DISCHARGE:                                 CONSULTATION   ADDENDUM:   DISCUSSION:  Given [name] current renal function lithium is not  contraindicated.  Not only can lithium be used as a mood stabilizer but  it also can be used as an adjunct in schizophrenia.   Given his CBC findings there are no contraindications for continuing his  Clozaril.  However, monitoring of CBC is necessary as mentioned.      Felizardo Hoffmann, M.D.  Electronically Signed     JW/MEDQ  D:  08/23/2008  T:  08/23/2008  Job:  [id number]

## 2011-04-09 ENCOUNTER — Other Ambulatory Visit (HOSPITAL_COMMUNITY): Payer: Self-pay | Admitting: Oncology

## 2011-04-09 ENCOUNTER — Encounter (HOSPITAL_BASED_OUTPATIENT_CLINIC_OR_DEPARTMENT_OTHER): Payer: Medicaid Other | Admitting: Oncology

## 2011-04-09 DIAGNOSIS — C8585 Other specified types of non-Hodgkin lymphoma, lymph nodes of inguinal region and lower limb: Secondary | ICD-10-CM

## 2011-04-09 DIAGNOSIS — C9110 Chronic lymphocytic leukemia of B-cell type not having achieved remission: Secondary | ICD-10-CM

## 2011-04-09 DIAGNOSIS — C8586 Other specified types of non-Hodgkin lymphoma, intrapelvic lymph nodes: Secondary | ICD-10-CM

## 2011-04-09 LAB — CBC WITH DIFFERENTIAL/PLATELET
Basophils Absolute: 0 10*3/uL (ref 0.0–0.1)
Eosinophils Absolute: 0.2 10*3/uL (ref 0.0–0.5)
HCT: 40.2 % (ref 38.4–49.9)
HGB: 13.4 g/dL (ref 13.0–17.1)
LYMPH%: 12.8 % — ABNORMAL LOW (ref 14.0–49.0)
MCHC: 33.5 g/dL (ref 32.0–36.0)
MONO#: 0.4 10*3/uL (ref 0.1–0.9)
NEUT#: 6.1 10*3/uL (ref 1.5–6.5)
NEUT%: 79.5 % — ABNORMAL HIGH (ref 39.0–75.0)
Platelets: 137 10*3/uL — ABNORMAL LOW (ref 140–400)
WBC: 7.7 10*3/uL (ref 4.0–10.3)
lymph#: 1 10*3/uL (ref 0.9–3.3)

## 2011-04-09 LAB — COMPREHENSIVE METABOLIC PANEL
ALT: 14 U/L (ref 0–53)
BUN: 18 mg/dL (ref 6–23)
CO2: 21 mEq/L (ref 19–32)
Calcium: 9.8 mg/dL (ref 8.4–10.5)
Chloride: 109 mEq/L (ref 96–112)
Creatinine, Ser: 1.42 mg/dL (ref 0.40–1.50)
Glucose, Bld: 127 mg/dL — ABNORMAL HIGH (ref 70–99)
Total Bilirubin: 0.3 mg/dL (ref 0.3–1.2)

## 2011-04-09 LAB — LACTATE DEHYDROGENASE: LDH: 107 U/L (ref 94–250)

## 2011-08-10 ENCOUNTER — Encounter (HOSPITAL_BASED_OUTPATIENT_CLINIC_OR_DEPARTMENT_OTHER): Payer: Medicare Other | Admitting: Oncology

## 2011-08-10 ENCOUNTER — Other Ambulatory Visit (HOSPITAL_COMMUNITY): Payer: Self-pay | Admitting: Oncology

## 2011-08-10 DIAGNOSIS — C9110 Chronic lymphocytic leukemia of B-cell type not having achieved remission: Secondary | ICD-10-CM

## 2011-08-10 DIAGNOSIS — C8590 Non-Hodgkin lymphoma, unspecified, unspecified site: Secondary | ICD-10-CM

## 2011-08-10 DIAGNOSIS — C8585 Other specified types of non-Hodgkin lymphoma, lymph nodes of inguinal region and lower limb: Secondary | ICD-10-CM

## 2011-08-10 DIAGNOSIS — L989 Disorder of the skin and subcutaneous tissue, unspecified: Secondary | ICD-10-CM

## 2011-08-10 DIAGNOSIS — C8586 Other specified types of non-Hodgkin lymphoma, intrapelvic lymph nodes: Secondary | ICD-10-CM

## 2011-08-10 LAB — COMPREHENSIVE METABOLIC PANEL
ALT: 15 U/L (ref 0–53)
AST: 14 U/L (ref 0–37)
Albumin: 4.5 g/dL (ref 3.5–5.2)
CO2: 26 mEq/L (ref 19–32)
Calcium: 9.9 mg/dL (ref 8.4–10.5)
Chloride: 108 mEq/L (ref 96–112)
Potassium: 4.2 mEq/L (ref 3.5–5.3)
Total Protein: 6.3 g/dL (ref 6.0–8.3)

## 2011-08-10 LAB — CBC WITH DIFFERENTIAL/PLATELET
BASO%: 0.5 % (ref 0.0–2.0)
EOS%: 1.6 % (ref 0.0–7.0)
MCH: 30.6 pg (ref 27.2–33.4)
MCHC: 34 g/dL (ref 32.0–36.0)
MONO#: 0.4 10*3/uL (ref 0.1–0.9)
NEUT%: 81.4 % — ABNORMAL HIGH (ref 39.0–75.0)
RBC: 4.65 10*6/uL (ref 4.20–5.82)
RDW: 13.4 % (ref 11.0–14.6)
WBC: 7.5 10*3/uL (ref 4.0–10.3)
lymph#: 0.9 10*3/uL (ref 0.9–3.3)

## 2011-08-10 LAB — LACTATE DEHYDROGENASE: LDH: 106 U/L (ref 94–250)

## 2011-08-17 LAB — COMPREHENSIVE METABOLIC PANEL
ALT: 9
ALT: <8
AST: 15
Albumin: 3 — ABNORMAL LOW
Alkaline Phosphatase: 82
BUN: 13
CO2: 26
CO2: 27
Calcium: 8.9
Chloride: 104
Chloride: 110
Creatinine, Ser: 1.65 — ABNORMAL HIGH
GFR calc Af Amer: 53 — ABNORMAL LOW
GFR calc non Af Amer: 44 — ABNORMAL LOW
GFR calc non Af Amer: 59 — ABNORMAL LOW
Glucose, Bld: 107 — ABNORMAL HIGH
Potassium: 4.4
Sodium: 137
Sodium: 140
Total Bilirubin: 0.7
Total Protein: 5.1 — ABNORMAL LOW

## 2011-08-17 LAB — CULTURE, BLOOD (ROUTINE X 2): Culture: NO GROWTH

## 2011-08-17 LAB — DIFFERENTIAL
Basophils Absolute: 0
Basophils Absolute: 0.2 — ABNORMAL HIGH
Basophils Absolute: 0.2 — ABNORMAL HIGH
Basophils Absolute: 0.2 — ABNORMAL HIGH
Basophils Relative: 1
Basophils Relative: 0
Basophils Relative: 1
Eosinophils Absolute: 0.2
Eosinophils Relative: 1
Lymphocytes Relative: 62 — ABNORMAL HIGH
Lymphocytes Relative: 73 — ABNORMAL HIGH
Lymphs Abs: 11.5 — ABNORMAL HIGH
Lymphs Abs: 16.5 — ABNORMAL HIGH
Lymphs Abs: 20.4 — ABNORMAL HIGH
Lymphs Abs: 18.4 — ABNORMAL HIGH
Lymphs Abs: 6.3 — ABNORMAL HIGH
Monocytes Absolute: 0.6
Monocytes Absolute: 0.7
Monocytes Relative: 2 — ABNORMAL LOW
Monocytes Relative: 3
Monocytes Relative: 3
Neutro Abs: 4.7
Neutro Abs: 6.1
Neutro Abs: 12.4 — ABNORMAL HIGH

## 2011-08-17 LAB — CBC
HCT: 34.1 — ABNORMAL LOW
HCT: 34.7 — ABNORMAL LOW
Hemoglobin: 10.3 — ABNORMAL LOW
Hemoglobin: 11.2 — ABNORMAL LOW
MCHC: 32.7
MCHC: 32.9
MCHC: 33
MCHC: 33.3
MCV: 83.6
MCV: 84.1
MCV: 84.3
Platelets: 130 — ABNORMAL LOW
Platelets: 144 — ABNORMAL LOW
Platelets: 188
Platelets: 144 — ABNORMAL LOW
RBC: 3.83 — ABNORMAL LOW
RBC: 4.05 — ABNORMAL LOW
RBC: 4.12 — ABNORMAL LOW
RDW: 16.1 — ABNORMAL HIGH
WBC: 18.6 — ABNORMAL HIGH
WBC: 27.6 — ABNORMAL HIGH

## 2011-08-17 LAB — BASIC METABOLIC PANEL
BUN: 18
BUN: 24 — ABNORMAL HIGH
Creatinine, Ser: 1.27
GFR calc Af Amer: 55 — ABNORMAL LOW
GFR calc Af Amer: >60
GFR calc Af Amer: >60
GFR calc non Af Amer: 46 — ABNORMAL LOW
GFR calc non Af Amer: 54 — ABNORMAL LOW
GFR calc non Af Amer: 59 — ABNORMAL LOW
Potassium: 5.5 — ABNORMAL HIGH
Potassium: 5.6 — ABNORMAL HIGH
Sodium: 140
Sodium: 141

## 2011-08-17 LAB — POCT CARDIAC MARKERS
CKMB, poc: 1
Myoglobin, poc: 162
Troponin i, poc: <0.05

## 2011-08-17 LAB — GLUCOSE, CAPILLARY
Glucose-Capillary: 103 — ABNORMAL HIGH
Glucose-Capillary: 108 — ABNORMAL HIGH
Glucose-Capillary: 133 — ABNORMAL HIGH
Glucose-Capillary: 162 — ABNORMAL HIGH
Glucose-Capillary: 266 — ABNORMAL HIGH
Glucose-Capillary: 105 — ABNORMAL HIGH

## 2011-08-17 LAB — URINALYSIS, ROUTINE W REFLEX MICROSCOPIC
Hgb urine dipstick: NEGATIVE
Ketones, ur: 15 — AB
Nitrite: NEGATIVE
Protein, ur: NEGATIVE
Urobilinogen, UA: 1

## 2011-08-17 LAB — POCT I-STAT, CHEM 8
BUN: 16
BUN: 24 — ABNORMAL HIGH
Calcium, Ion: 1.04 — ABNORMAL LOW
Calcium, Ion: 1.21
Calcium, Ion: 1.27
Chloride: 105
Creatinine, Ser: 1.5
Creatinine, Ser: 1.7 — ABNORMAL HIGH
Glucose, Bld: 99
HCT: 33 — ABNORMAL LOW
Hemoglobin: 10.9 — ABNORMAL LOW
Hemoglobin: 11.2 — ABNORMAL LOW
Potassium: 4.3
Sodium: 135
Sodium: 136
TCO2: 21
TCO2: 25
TCO2: 25

## 2011-08-17 LAB — LITHIUM LEVEL: Lithium Lvl: 0.87

## 2011-08-17 LAB — URIC ACID
Uric Acid, Serum: 4.6
Uric Acid, Serum: 5.1

## 2011-08-17 LAB — LACTATE DEHYDROGENASE
LDH: 126
LDH: 144
LDH: 162

## 2011-08-17 LAB — VANCOMYCIN, TROUGH: Vancomycin Tr: 12.4

## 2011-08-17 LAB — PHOSPHORUS
Phosphorus: 3.3
Phosphorus: 4.3
Phosphorus: 4.7 — ABNORMAL HIGH

## 2011-08-17 LAB — APTT: aPTT: 40 — ABNORMAL HIGH

## 2011-08-17 LAB — MAGNESIUM: Magnesium: 2.4

## 2011-08-17 LAB — URINE CULTURE: Colony Count: NO GROWTH

## 2011-10-28 ENCOUNTER — Other Ambulatory Visit: Payer: Self-pay | Admitting: Gastroenterology

## 2011-10-31 ENCOUNTER — Telehealth: Payer: Self-pay | Admitting: Oncology

## 2011-10-31 NOTE — Telephone Encounter (Signed)
Talked to [name], Freight forwarder of home where pt is, gave him appt date for lab, CT and MD in January 2013

## 2011-12-07 ENCOUNTER — Ambulatory Visit: Payer: Medicare Other | Admitting: Oncology

## 2011-12-07 ENCOUNTER — Ambulatory Visit (HOSPITAL_COMMUNITY)
Admission: RE | Admit: 2011-12-07 | Discharge: 2011-12-07 | Disposition: A | Discharge status: Home / Self Care | Payer: Medicare Other | Source: Ambulatory Visit | Attending: Oncology | Admitting: Oncology

## 2011-12-07 ENCOUNTER — Other Ambulatory Visit: Payer: Medicare Other | Admitting: Lab

## 2011-12-07 DIAGNOSIS — C8589 Other specified types of non-Hodgkin lymphoma, extranodal and solid organ sites: Secondary | ICD-10-CM | POA: Insufficient documentation

## 2011-12-07 DIAGNOSIS — K4020 Bilateral inguinal hernia, without obstruction or gangrene, not specified as recurrent: Secondary | ICD-10-CM | POA: Insufficient documentation

## 2011-12-07 DIAGNOSIS — C8590 Non-Hodgkin lymphoma, unspecified, unspecified site: Secondary | ICD-10-CM

## 2011-12-07 DIAGNOSIS — D3500 Benign neoplasm of unspecified adrenal gland: Secondary | ICD-10-CM | POA: Insufficient documentation

## 2011-12-07 DIAGNOSIS — R161 Splenomegaly, not elsewhere classified: Secondary | ICD-10-CM | POA: Insufficient documentation

## 2011-12-07 LAB — CBC WITH DIFFERENTIAL/PLATELET
BASO%: 0.3 % (ref 0.0–2.0)
Basophils Absolute: 0 10*3/uL (ref 0.0–0.1)
EOS%: 2 % (ref 0.0–7.0)
HCT: 43.8 % (ref 38.4–49.9)
HGB: 15 g/dL (ref 13.0–17.1)
LYMPH%: 13.7 % — ABNORMAL LOW (ref 14.0–49.0)
MCH: 30.8 pg (ref 27.2–33.4)
MCHC: 34.2 g/dL (ref 32.0–36.0)
NEUT%: 79.2 % — ABNORMAL HIGH (ref 39.0–75.0)
Platelets: 142 10*3/uL (ref 140–400)

## 2011-12-07 LAB — CMP (CANCER CENTER ONLY)
ALT(SGPT): 26 U/L (ref 10–47)
AST: 21 U/L (ref 11–38)
BUN, Bld: 17 mg/dL (ref 7–22)
CO2: 26 mEq/L (ref 18–33)
Calcium: 9.3 mg/dL (ref 8.0–10.3)
Chloride: 107 mEq/L (ref 98–108)
Creat: 1.3 mg/dl — ABNORMAL HIGH (ref 0.6–1.2)
Total Bilirubin: 0.5 mg/dl (ref 0.20–1.60)

## 2011-12-07 MED ORDER — IOHEXOL 300 MG/ML  SOLN
125.0000 mL | Freq: Once | INTRAMUSCULAR | Status: AC | PRN
  Administered 2011-12-07: 125 mL via INTRAVENOUS

## 2011-12-08 LAB — IGG, IGA, IGM
IgA: 63 mg/dL — ABNORMAL LOW (ref 68–379)
IgM, Serum: 18 mg/dL — ABNORMAL LOW (ref 41–251)

## 2011-12-08 LAB — LACTATE DEHYDROGENASE: LDH: 129 U/L (ref 94–250)

## 2011-12-09 ENCOUNTER — Encounter: Payer: Self-pay | Admitting: Medical Oncology

## 2011-12-10 ENCOUNTER — Ambulatory Visit: Payer: Medicare Other | Admitting: Oncology

## 2012-03-22 ENCOUNTER — Encounter (HOSPITAL_COMMUNITY): Payer: Self-pay | Admitting: *Deleted

## 2012-03-22 ENCOUNTER — Emergency Department (HOSPITAL_COMMUNITY): Payer: Medicare Other

## 2012-03-22 ENCOUNTER — Emergency Department (HOSPITAL_COMMUNITY)
Admission: EM | Admit: 2012-03-22 | Discharge: 2012-03-22 | Disposition: A | Discharge status: Home / Self Care | Payer: Medicare Other | Attending: Emergency Medicine | Admitting: Emergency Medicine

## 2012-03-22 DIAGNOSIS — Z7982 Long term (current) use of aspirin: Secondary | ICD-10-CM | POA: Insufficient documentation

## 2012-03-22 DIAGNOSIS — R0602 Shortness of breath: Secondary | ICD-10-CM | POA: Insufficient documentation

## 2012-03-22 DIAGNOSIS — Z79899 Other long term (current) drug therapy: Secondary | ICD-10-CM | POA: Insufficient documentation

## 2012-03-22 DIAGNOSIS — F79 Unspecified intellectual disabilities: Secondary | ICD-10-CM | POA: Insufficient documentation

## 2012-03-22 DIAGNOSIS — Z8659 Personal history of other mental and behavioral disorders: Secondary | ICD-10-CM | POA: Insufficient documentation

## 2012-03-22 DIAGNOSIS — C8589 Other specified types of non-Hodgkin lymphoma, extranodal and solid organ sites: Secondary | ICD-10-CM | POA: Insufficient documentation

## 2012-03-22 DIAGNOSIS — N189 Chronic kidney disease, unspecified: Secondary | ICD-10-CM | POA: Insufficient documentation

## 2012-03-22 LAB — POCT I-STAT TROPONIN I: Troponin i, poc: 0.02 ng/mL (ref 0.00–0.08)

## 2012-03-22 LAB — DIFFERENTIAL
Basophils Absolute: 0 10*3/uL (ref 0.0–0.1)
Basophils Relative: 1 % (ref 0–1)
Lymphocytes Relative: 21 % (ref 12–46)
Neutro Abs: 5.8 10*3/uL (ref 1.7–7.7)
Neutrophils Relative %: 67 % (ref 43–77)

## 2012-03-22 LAB — BASIC METABOLIC PANEL
CO2: 25 mEq/L (ref 19–32)
Chloride: 107 mEq/L (ref 96–112)
Creatinine, Ser: 1.48 mg/dL — ABNORMAL HIGH (ref 0.50–1.35)
GFR calc Af Amer: 58 mL/min — ABNORMAL LOW (ref >90)
Potassium: 5.5 mEq/L — ABNORMAL HIGH (ref 3.5–5.1)
Sodium: 140 mEq/L (ref 135–145)

## 2012-03-22 LAB — CBC
HCT: 43.3 % (ref 39.0–52.0)
MCHC: 32.6 g/dL (ref 30.0–36.0)
RDW: 14.3 % (ref 11.5–15.5)
WBC: 8.7 10*3/uL (ref 4.0–10.5)

## 2012-03-22 NOTE — Discharge Instructions (Signed)
It is very important to stay well-hydrated and keep a well-rounded diet. Followup with your primary care provider in the next 1-2 days to recheck your potassium and to recheck any ongoing shortness of breath but return to emergency department for any emergent changing or worsening of symptoms.  Shortness of Breath Shortness of breath (dyspnea) is the feeling of uneasy breathing. Shortness of breath does not always mean that there is a life-threatening illness. However, shortness of breath requires immediate medical care. CAUSES  Causes for shortness of breath include:  Not enough oxygen in the air (as with high altitudes or with a smoke-filled room).   Short-term (acute) lung disease, including:   Infections such as pneumonia.   Fluid in the lungs, such as heart failure.   A blood clot in the lungs (pulmonary embolism).   Lasting (chronic) lung diseases.   Heart disease (heart attack, angina, heart failure, and others).   Low red blood cells (anemia).   Poor physical fitness. This can cause shortness of breath when you exercise.   Chest or back injuries or stiffness.   Being overweight (obese).   Anxiety. This can make you feel like you are not getting enough air.  DIAGNOSIS  Serious medical problems can usually be found during your physical exam. Many tests may also be done to determine why you are having shortness of breath. Tests include:  Chest X-rays.   Lung function tests.   Blood tests.   Electrocardiography.   Exercise testing.   A cardiac echo.   Imaging scans.  Your caregiver may not be able to find a cause for your shortness of breath after your exam. In this case, it is important to have a follow-up exam with your caregiver as directed.  HOME CARE INSTRUCTIONS   Do not smoke. Smoking is a common cause of shortness of breath. Ask for help to stop smoking.   Avoid being around chemicals that may bother your breathing (paint fumes, dust).   Rest as  needed. Slowly resume your usual activities.   If medicines were prescribed, take them as directed for the full length of time directed. This includes oxygen and any inhaled medicines.   Follow up with your caregiver as directed. Waiting to do so or failure to follow up could result in worsening of your condition and possible disability or death.   Be sure you understand what to do or who to call if your shortness of breath worsens.  SEEK MEDICAL CARE IF:   Your condition does not improve in the time expected.   You have a hard time doing your normal activities even with rest.   You have any side effects or problems with the medicines prescribed.   You develop any new symptoms.  SEEK IMMEDIATE MEDICAL CARE IF:   Your shortness of breath is getting worse.   You feel lightheaded, faint, or develop a cough not controlled with medicines.   You start coughing up blood.   You have pain with breathing.   You have chest pain or pain in your arms, shoulders, or abdomen.   You have a fever.   You are unable to walk up stairs or exercise the way you normally do.   Your symptoms are getting worse.  Document Released: 07/28/2001 Document Revised: 10/22/2011 Document Reviewed: 03/14/2008 [facility] Patient Information 2012 [place].

## 2012-03-22 NOTE — ED Notes (Signed)
Pt in c/o intermittent shortness of breath since yesterday, was told by psychiatrist to come to ED for further evaluation of abnormal EKG, pt denies chest pain

## 2012-03-22 NOTE — ED Provider Notes (Signed)
History     CSN: [id number]  Arrival date & time 03/22/12  1347   First MD Initiated Contact with Patient 03/22/12 1730      Chief Complaint  Patient presents with  . Shortness of Breath    (Consider location/radiation/quality/duration/timing/severity/associated sxs/prior treatment) HPI  Patient who lives in a SNF is brought to ER by his caregiver and power of attorney presents to ER with complaint of SOB. Patient has MR and therefore level 5 caveat applies due to poor historian. Care giver states that yesterday patient complained of "being winded" and that today when he was tying his shoe he was taking a long time and when she asked him what was taking so long he responded "let me catch my breath." she denies fevers, chills, recent cough or illness, n/v/d, or lower extremity swelling. She states that she took patient to psychiatrist today and mentioned SOB and he stated that based on EKG comparison over the last few years that he felt "he needed to be followed up closely by PCP." caregiver states that she then became worried that he was complaining of SOB and had EKG changes. Patient denies any CP and caregiver denies complaints of CP. Patient is lying comfortably in bed without complaint. He has hx of NHL, CLL and is followed by Dr. Fredderick Phenix in the facility.   Past Medical History  Diagnosis Date  . NHL (non-Hodgkin's lymphoma) dx'd 08/2008 rt groin    chemo comp 08/2008  . CLL (chronic lymphoblastic leukemia) 10/2003  . Chronic kidney disease 09/20/2008  . Paranoid schizophrenia   . Hiatal hernia   . cll dx'd 10/2003    no rx  . Non Hodgkin's lymphoma 08/2008    History reviewed. No pertinent past surgical history.  History reviewed. No pertinent family history.  History  Substance Use Topics  . Smoking status: Not on file  . Smokeless tobacco: Not on file  . Alcohol Use: Not on file      Review of Systems  Unable to perform ROS   Allergies  Review of patient's  allergies indicates no known allergies.  Home Medications   Current Outpatient Rx  Name Route Sig Dispense Refill  . ASPIRIN EC 81 MG PO TBEC Oral Take 81 mg by mouth daily.    Marland Kitchen BENZTROPINE MESYLATE 0.5 MG PO TABS Oral Take 0.5 mg by mouth daily.    . CHLORPROMAZINE HCL 25 MG PO TABS Oral Take 25 mg by mouth 2 (two) times daily.    Marland Kitchen VITAMIN D 1000 UNITS PO TABS Oral Take 1,000 Units by mouth daily.    Marland Kitchen CLOZAPINE 200 MG PO TABS Oral Take 200 mg by mouth 2 (two) times daily.    Marland Kitchen ESCITALOPRAM OXALATE 10 MG PO TABS Oral Take 10 mg by mouth daily.    Marland Kitchen LEVOTHYROXINE SODIUM 100 MCG PO TABS Oral Take 100 mcg by mouth daily.    Marland Kitchen LITHIUM CARBONATE 300 MG PO CAPS Oral Take 300 mg by mouth 2 (two) times daily.    . OMEGA-3 FATTY ACIDS 1000 MG PO CPDR Oral Take 4,000 mg by mouth daily.     . SENNOSIDES 8.6 MG PO TABS Oral Take 1 tablet by mouth daily.      BP 127/76  Pulse 93  Temp(Src) 98.2 F (36.8 C) (Oral)  Resp 16  SpO2 94%  Physical Exam  Nursing note and vitals reviewed. Constitutional: He is oriented to person, place, and time. He appears well-developed and well-nourished. No distress.  HENT:  Head: Normocephalic and atraumatic.  Eyes: Conjunctivae are normal.  Neck: Normal range of motion. Neck supple.  Cardiovascular: Normal rate, regular rhythm, normal heart sounds and intact distal pulses.  Exam reveals no gallop and no friction rub.   No murmur heard. Pulmonary/Chest: Effort normal and breath sounds normal. No respiratory distress. He has no wheezes. He has no rales. He exhibits no tenderness.  Abdominal: Soft. Bowel sounds are normal. He exhibits no distension and no mass. There is no tenderness. There is no rebound and no guarding.  Musculoskeletal: Normal range of motion. He exhibits no edema and no tenderness.  Neurological: He is alert and oriented to person, place, and time.  Skin: Skin is warm and dry. No rash noted. He is not diaphoretic. No erythema.    Psychiatric: He has a normal mood and affect.    ED Course  Procedures (including critical care time)   Date: 03/22/2012  Rate: 90  Rhythm: normal sinus rhythm  QRS Axis: normal  Intervals: normal  ST/T Wave abnormalities: normal  Conduction Disutrbances:incomplete RBBB  Narrative Interpretation:   Old EKG Reviewed: no significant changes compared to Nov 11, 2009 non provocative EKG   Labs Reviewed  CBC - Abnormal; Notable for the following:    Platelets 145 (*)    All other components within normal limits  BASIC METABOLIC PANEL - Abnormal; Notable for the following:    Potassium 5.5 (*) SLIGHT HEMOLYSIS   Creatinine, Ser 1.48 (*)    GFR calc non Af Amer 50 (*)    GFR calc Af Amer 58 (*)    All other components within normal limits  DIFFERENTIAL  POCT I-STAT TROPONIN I   Dg Chest 2 View  03/22/2012  *RADIOLOGY REPORT*  Clinical Data: Shortness of breath, weakness  CHEST - 2 VIEW  Comparison:  12/07/2011 and 10/11/2009  Findings: Cardiomediastinal silhouette is stable.  No acute infiltrate or pleural effusion.  No pulmonary edema.  Mild degenerative changes lower thoracic spine.  IMPRESSION: No active disease.  Original Report Authenticated By: Lahoma Crocker, M.D.     1. Shortness of breath       MDM  Patient complains of mild shortness of breath when prompted by his caregiver however he is overall no complaints. His chest x-ray is normal and he has no significant findings on EKG. No significant findings on labs. He has no complaints of chest pain is low-risk for PE.  Patient to follow up with PCP closely to recheck potassium that had hemolysis on draw and was slightly elevated likely secondary to hemolysis with baseline unchanged renal insufficiency. No peaked T waves on EKG.         Eben Burow, [place] 03/22/12 2029

## 2012-03-22 NOTE — ED Provider Notes (Signed)
Medical screening examination/treatment/procedure(s) were performed by non-physician practitioner and as supervising physician I was immediately available for consultation/collaboration.   Hoy Morn, MD 03/22/12 2034

## 2012-05-04 ENCOUNTER — Ambulatory Visit (INDEPENDENT_AMBULATORY_CARE_PROVIDER_SITE_OTHER): Payer: Medicare Other | Admitting: Cardiovascular Disease

## 2012-05-04 ENCOUNTER — Encounter: Payer: Self-pay | Admitting: Cardiovascular Disease

## 2012-05-04 VITALS — BP 122/74 | HR 90 | Ht 72.0 in | Wt 274.0 lb

## 2012-05-04 DIAGNOSIS — R079 Chest pain, unspecified: Secondary | ICD-10-CM

## 2012-05-04 DIAGNOSIS — R0600 Dyspnea, unspecified: Secondary | ICD-10-CM

## 2012-05-04 DIAGNOSIS — R0609 Other forms of dyspnea: Secondary | ICD-10-CM

## 2012-05-04 NOTE — Patient Instructions (Addendum)
Your physician has requested that you have a dobutamine echocardiogram. For further information please visit [email or web address]. Please follow instruction sheet as given.  Your physician recommends that you schedule a follow-up appointment in: as needed

## 2012-05-04 NOTE — Progress Notes (Signed)
HPI  This is a 61 year old male who was referred by nurse practitioner Lynnell Catalan for evaluation of chest pain and an abnormal ECG. 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 He lives in a group home. He is accompanied today by his guardian. He is a very poor historian and cannot provide much history. According to his guardian, he has complained of substernal chest discomfort recently on multiple occasions and he was noted to place his hand on his chest. This lasted for a few minutes. It happened both at rest and with activities. It's not consistent. He also has been noted to have increased exertional dyspnea. He gained about 30 pounds over the last 6 months. He does not have any previous cardiac history. There is no history of diabetes, hypertension or hyperlipidemia.  He does have history of lymphoma which was treated most recently in 2009 with chemotherapy.  No Known Allergies   Current Outpatient Prescriptions on File Prior to Visit  Medication Sig Dispense Refill  . aspirin EC 81 MG tablet Take 81 mg by mouth daily.      . benztropine (COGENTIN) 0.5 MG tablet Take 0.5 mg by mouth daily.      . cholecalciferol (VITAMIN D) 1000 UNITS tablet Take 1,000 Units by mouth daily.      . clozapine (CLOZARIL) 200 MG tablet Take 200 mg by mouth 2 (two) times daily.      Marland Kitchen escitalopram (LEXAPRO) 10 MG tablet Take 10 mg by mouth daily.      Marland Kitchen levothyroxine (SYNTHROID, LEVOTHROID) 100 MCG tablet Take 100 mcg by mouth daily.      Marland Kitchen lithium carbonate 300 MG capsule Take 300 mg by mouth 2 (two) times daily.      . Omega-3 Fatty Acids 1000 MG CPDR Take 4,000 mg by mouth daily.       Marland Kitchen senna (SENOKOT) 8.6 MG tablet Take 1 tablet by mouth daily.         Past Medical History  Diagnosis Date  . Chronic kidney disease 09/20/2008  . Paranoid schizophrenia   . Hiatal hernia   . NHL (non-Hodgkin's lymphoma) dx'd 08/2008 rt groin    chemo comp 08/2008  . CLL (chronic  lymphoblastic leukemia) 10/2003  . cll dx'd 10/2003    no rx  . Non Hodgkin's lymphoma 08/2008  . GERD (gastroesophageal reflux disease)      History reviewed. No pertinent past surgical history.   History reviewed. No pertinent family history.   History   Social History  . Marital Status: Single    Spouse Name: N/A    Number of Children: N/A  . Years of Education: N/A   Occupational History  . Not on file.   Social History Main Topics  . Smoking status: Never Smoker   . Smokeless tobacco: Not on file  . Alcohol Use: No  . Drug Use: No  . Sexually Active: Not on file   Other Topics Concern  . Not on file   Social History Narrative  . No narrative on file     ROS Unable to accurately obtain due to his mental illness.  PHYSICAL EXAM   BP 122/74  Pulse 90  Ht 6' (1.829 m)  Wt 274 lb (124.286 kg)  BMI 37.16 kg/m2  Constitutional: He is oriented to person, place, and time. He appears well-developed and well-nourished. No distress.  HENT: No nasal discharge.  Head: Normocephalic and atraumatic.  Eyes: Pupils are equal and round.  Right eye exhibits no discharge. Left eye exhibits no discharge.  Neck: Normal range of motion. Neck supple. No JVD present. No thyromegaly present.  Cardiovascular: Normal rate, regular rhythm, normal heart sounds and. Exam reveals no gallop and no friction rub. No murmur heard.  Pulmonary/Chest: Effort normal and breath sounds normal. No stridor. No respiratory distress. He has no wheezes. He has no rales. He exhibits no tenderness.  Abdominal: Soft. Bowel sounds are normal. He exhibits no distension. There is no tenderness. There is no rebound and no guarding.  Musculoskeletal: Normal range of motion. He exhibits no edema and no tenderness.  Neurological: He is alert and oriented to person, place, and time. Coordination normal.  Skin: Skin is warm and dry. No rash noted. He is not diaphoretic. No erythema. No pallor.  Psychiatric:  He has a normal mood and affect. His behavior is normal. Judgment and thought content normal.      EKG: His EKG was reviewed which showed normal sinus rhythm with incomplete right bundle branch block.   ASSESSMENT AND PLAN

## 2012-05-04 NOTE — Assessment & Plan Note (Signed)
The patient's chest pain is overall atypical. He is a poor historian and could not provide much details. His ECG showed incomplete right bundle branch block but no evidence of ischemic changes. I recommend evaluation with a dobutamine stress echo. The patient is not able to hold still for a nuclear stress test. He is also not able to do a treadmill stress test. Most likely, would be conservative with him unless his stress test shows significant ischemia.

## 2012-05-04 NOTE — Assessment & Plan Note (Signed)
I suspect that this is due to deconditioning and weight gain. The dobutamine echo will give [place] an idea about his LV systolic function and any other obvious valvular abnormalities.

## 2012-05-09 ENCOUNTER — Encounter: Payer: Self-pay | Admitting: Cardiovascular Disease

## 2012-05-27 ENCOUNTER — Ambulatory Visit (HOSPITAL_COMMUNITY): Payer: Medicare Other | Attending: Cardiovascular Disease

## 2012-05-27 ENCOUNTER — Other Ambulatory Visit (HOSPITAL_COMMUNITY): Payer: Self-pay | Admitting: *Deleted

## 2012-05-27 ENCOUNTER — Encounter: Payer: Self-pay | Admitting: Cardiovascular Disease

## 2012-05-27 VITALS — BP 141/84 | HR 76 | Ht 73.0 in | Wt 270.0 lb

## 2012-05-27 DIAGNOSIS — R079 Chest pain, unspecified: Secondary | ICD-10-CM | POA: Insufficient documentation

## 2012-05-27 DIAGNOSIS — C8589 Other specified types of non-Hodgkin lymphoma, extranodal and solid organ sites: Secondary | ICD-10-CM | POA: Insufficient documentation

## 2012-05-27 DIAGNOSIS — F200 Paranoid schizophrenia: Secondary | ICD-10-CM | POA: Insufficient documentation

## 2012-05-27 DIAGNOSIS — R072 Precordial pain: Secondary | ICD-10-CM

## 2012-05-27 DIAGNOSIS — R0609 Other forms of dyspnea: Secondary | ICD-10-CM | POA: Insufficient documentation

## 2012-05-27 DIAGNOSIS — R9431 Abnormal electrocardiogram [ECG] [EKG]: Secondary | ICD-10-CM | POA: Insufficient documentation

## 2012-05-27 DIAGNOSIS — F79 Unspecified intellectual disabilities: Secondary | ICD-10-CM | POA: Insufficient documentation

## 2012-05-27 DIAGNOSIS — R0989 Other specified symptoms and signs involving the circulatory and respiratory systems: Secondary | ICD-10-CM | POA: Insufficient documentation

## 2012-05-27 MED ORDER — SODIUM CHLORIDE 0.9 % IV SOLN
40.0000 ug/kg | Freq: Once | INTRAVENOUS | Status: AC
  Administered 2012-05-27: 40 ug/kg/min via INTRAVENOUS

## 2012-05-27 NOTE — Progress Notes (Signed)
Echocardiogram performed.  

## 2012-05-27 NOTE — Addendum Note (Signed)
Addended by: Irving Shows on: 05/27/2012 09:30 AM   Modules accepted: Orders

## 2012-06-01 ENCOUNTER — Other Ambulatory Visit: Payer: Self-pay | Admitting: Cardiovascular Disease

## 2012-06-02 ENCOUNTER — Telehealth: Payer: Self-pay | Admitting: Cardiology

## 2012-06-02 NOTE — Telephone Encounter (Signed)
[facility] [phone]  Please return call to [facility] Nurse concerning patient care

## 2012-06-02 NOTE — Telephone Encounter (Signed)
Stress echo results were given per Dr Fletcher Anon.

## 2012-12-24 ENCOUNTER — Other Ambulatory Visit: Payer: Self-pay

## 2012-12-24 ENCOUNTER — Emergency Department (HOSPITAL_COMMUNITY): Payer: Medicare Other

## 2012-12-24 ENCOUNTER — Inpatient Hospital Stay (HOSPITAL_COMMUNITY)
Admission: EM | Admit: 2012-12-24 | Discharge: 2012-12-26 | DRG: 194 | Disposition: A | Discharge status: Home Health | Payer: Medicare Other | Attending: Internal Medicine | Admitting: Internal Medicine

## 2012-12-24 ENCOUNTER — Encounter (HOSPITAL_COMMUNITY): Payer: Self-pay | Admitting: Emergency Medicine

## 2012-12-24 DIAGNOSIS — J189 Pneumonia, unspecified organism: Principal | ICD-10-CM | POA: Diagnosis present

## 2012-12-24 DIAGNOSIS — D72829 Elevated white blood cell count, unspecified: Secondary | ICD-10-CM | POA: Diagnosis present

## 2012-12-24 DIAGNOSIS — C8589 Other specified types of non-Hodgkin lymphoma, extranodal and solid organ sites: Secondary | ICD-10-CM | POA: Diagnosis present

## 2012-12-24 DIAGNOSIS — N039 Chronic nephritic syndrome with unspecified morphologic changes: Secondary | ICD-10-CM | POA: Diagnosis present

## 2012-12-24 DIAGNOSIS — C8290 Follicular lymphoma, unspecified, unspecified site: Secondary | ICD-10-CM | POA: Diagnosis present

## 2012-12-24 DIAGNOSIS — D631 Anemia in chronic kidney disease: Secondary | ICD-10-CM | POA: Diagnosis present

## 2012-12-24 DIAGNOSIS — F209 Schizophrenia, unspecified: Secondary | ICD-10-CM | POA: Diagnosis present

## 2012-12-24 DIAGNOSIS — D649 Anemia, unspecified: Secondary | ICD-10-CM | POA: Diagnosis present

## 2012-12-24 DIAGNOSIS — N183 Chronic kidney disease, stage 3 unspecified: Secondary | ICD-10-CM | POA: Diagnosis present

## 2012-12-24 DIAGNOSIS — E669 Obesity, unspecified: Secondary | ICD-10-CM | POA: Diagnosis present

## 2012-12-24 LAB — CBC WITH DIFFERENTIAL/PLATELET
Basophils Absolute: 0 10*3/uL (ref 0.0–0.1)
Eosinophils Absolute: 0 10*3/uL (ref 0.0–0.7)
Eosinophils Absolute: 0.1 10*3/uL (ref 0.0–0.7)
Hemoglobin: 11.5 g/dL — ABNORMAL LOW (ref 13.0–17.0)
Lymphocytes Relative: 7 % — ABNORMAL LOW (ref 12–46)
Lymphs Abs: 1.2 10*3/uL (ref 0.7–4.0)
Lymphs Abs: 1.1 10*3/uL (ref 0.7–4.0)
MCH: 30.1 pg (ref 26.0–34.0)
Monocytes Relative: 6 % (ref 3–12)
Neutro Abs: 15.7 10*3/uL — ABNORMAL HIGH (ref 1.7–7.7)
Neutrophils Relative %: 87 % — ABNORMAL HIGH (ref 43–77)
Neutrophils Relative %: 85 % — ABNORMAL HIGH (ref 43–77)
Platelets: 192 10*3/uL (ref 150–400)
Platelets: 180 10*3/uL (ref 150–400)
RBC: 3.92 MIL/uL — ABNORMAL LOW (ref 4.22–5.81)
RBC: 4.09 MIL/uL — ABNORMAL LOW (ref 4.22–5.81)
RDW: 13.4 % (ref 11.5–15.5)
WBC: 18.2 10*3/uL — ABNORMAL HIGH (ref 4.0–10.5)
WBC: 17.4 10*3/uL — ABNORMAL HIGH (ref 4.0–10.5)

## 2012-12-24 LAB — POCT I-STAT, CHEM 8
BUN: 21 mg/dL (ref 6–23)
Chloride: 105 mEq/L (ref 96–112)
Creatinine, Ser: 1.8 mg/dL — ABNORMAL HIGH (ref 0.50–1.35)
Glucose, Bld: 215 mg/dL — ABNORMAL HIGH (ref 70–99)
Hemoglobin: 11.2 g/dL — ABNORMAL LOW (ref 13.0–17.0)
Potassium: 4.6 mEq/L (ref 3.5–5.1)
Sodium: 138 mEq/L (ref 135–145)

## 2012-12-24 LAB — COMPREHENSIVE METABOLIC PANEL
ALT: 10 U/L (ref 0–53)
AST: 10 U/L (ref 0–37)
Alkaline Phosphatase: 72 U/L (ref 39–117)
CO2: 26 mEq/L (ref 19–32)
Chloride: 102 mEq/L (ref 96–112)
GFR calc non Af Amer: 45 mL/min — ABNORMAL LOW (ref >90)
Potassium: 4.3 mEq/L (ref 3.5–5.1)
Sodium: 138 mEq/L (ref 135–145)
Total Bilirubin: 0.3 mg/dL (ref 0.3–1.2)

## 2012-12-24 LAB — INFLUENZA PANEL BY PCR (TYPE A & B): H1N1 flu by pcr: NOT DETECTED

## 2012-12-24 LAB — PROTIME-INR
INR: 1.14 (ref 0.00–1.49)
Prothrombin Time: 14.4 seconds (ref 11.6–15.2)

## 2012-12-24 MED ORDER — OMEGA-3-ACID ETHYL ESTERS 1 G PO CAPS
4000.0000 mg | ORAL_CAPSULE | Freq: Every day | ORAL | Status: DC
  Administered 2012-12-25 – 2012-12-26 (×2): 4000 mg via ORAL
  Filled 2012-12-24 (×2): qty 4

## 2012-12-24 MED ORDER — CHLORPROMAZINE HCL 10 MG PO TABS
10.0000 mg | ORAL_TABLET | Freq: Every day | ORAL | Status: DC
  Administered 2012-12-24 – 2012-12-25 (×2): 10 mg via ORAL
  Filled 2012-12-24 (×3): qty 1

## 2012-12-24 MED ORDER — BENZTROPINE MESYLATE 0.5 MG PO TABS
0.5000 mg | ORAL_TABLET | Freq: Every day | ORAL | Status: DC
  Administered 2012-12-25 – 2012-12-26 (×2): 0.5 mg via ORAL
  Filled 2012-12-24 (×4): qty 1

## 2012-12-24 MED ORDER — LITHIUM CARBONATE 300 MG PO CAPS
300.0000 mg | ORAL_CAPSULE | Freq: Two times a day (BID) | ORAL | Status: DC
  Administered 2012-12-24 – 2012-12-26 (×4): 300 mg via ORAL
  Filled 2012-12-24 (×5): qty 1

## 2012-12-24 MED ORDER — ACETAMINOPHEN 325 MG PO TABS
650.0000 mg | ORAL_TABLET | Freq: Four times a day (QID) | ORAL | Status: DC | PRN
  Administered 2012-12-24: 650 mg via ORAL
  Filled 2012-12-24: qty 2

## 2012-12-24 MED ORDER — DEXTROSE 5 % IV SOLN
1.0000 g | Freq: Once | INTRAVENOUS | Status: AC
  Administered 2012-12-24: 1 g via INTRAVENOUS
  Filled 2012-12-24: qty 10

## 2012-12-24 MED ORDER — ASPIRIN EC 81 MG PO TBEC
81.0000 mg | DELAYED_RELEASE_TABLET | Freq: Every day | ORAL | Status: DC
  Administered 2012-12-24 – 2012-12-26 (×3): 81 mg via ORAL
  Filled 2012-12-24 (×3): qty 1

## 2012-12-24 MED ORDER — ACETAMINOPHEN 650 MG RE SUPP: 650.0000 mg | Freq: Four times a day (QID) | RECTAL | Status: DC | PRN

## 2012-12-24 MED ORDER — DEXTROSE 5 % IV SOLN
500.0000 mg | Freq: Once | INTRAVENOUS | Status: AC
  Administered 2012-12-24: 500 mg via INTRAVENOUS
  Filled 2012-12-24: qty 500

## 2012-12-24 MED ORDER — MINERAL OIL PO OIL
TOPICAL_OIL | ORAL | Status: DC
  Filled 2012-12-24: qty 30

## 2012-12-24 MED ORDER — SENNA 8.6 MG PO TABS
1.0000 | ORAL_TABLET | Freq: Every day | ORAL | Status: DC
  Administered 2012-12-24 – 2012-12-26 (×3): 8.6 mg via ORAL
  Filled 2012-12-24 (×3): qty 1

## 2012-12-24 MED ORDER — CLOZAPINE 100 MG PO TABS
200.0000 mg | ORAL_TABLET | Freq: Two times a day (BID) | ORAL | Status: DC
  Administered 2012-12-24 – 2012-12-26 (×4): 200 mg via ORAL
  Filled 2012-12-24 (×5): qty 2

## 2012-12-24 MED ORDER — ESCITALOPRAM OXALATE 10 MG PO TABS
10.0000 mg | ORAL_TABLET | Freq: Every day | ORAL | Status: DC
  Administered 2012-12-25 – 2012-12-26 (×2): 10 mg via ORAL
  Filled 2012-12-24 (×2): qty 1

## 2012-12-24 MED ORDER — DEXTROSE 5 % IV SOLN
1.0000 g | INTRAVENOUS | Status: DC
  Administered 2012-12-25: 1 g via INTRAVENOUS
  Filled 2012-12-24 (×2): qty 10

## 2012-12-24 MED ORDER — SODIUM CHLORIDE 0.9 % IV BOLUS (SEPSIS)
2000.0000 mL | Freq: Once | INTRAVENOUS | Status: AC
  Administered 2012-12-24: 2000 mL via INTRAVENOUS

## 2012-12-24 MED ORDER — AZITHROMYCIN 500 MG IV SOLR
500.0000 mg | INTRAVENOUS | Status: DC
  Administered 2012-12-25: 500 mg via INTRAVENOUS
  Filled 2012-12-24 (×2): qty 500

## 2012-12-24 MED ORDER — DOCUSATE SODIUM 100 MG PO CAPS
100.0000 mg | ORAL_CAPSULE | Freq: Every day | ORAL | Status: DC
  Administered 2012-12-25 – 2012-12-26 (×2): 100 mg via ORAL
  Filled 2012-12-24 (×2): qty 1

## 2012-12-24 MED ORDER — PNEUMOCOCCAL VAC POLYVALENT 25 MCG/0.5ML IJ INJ
0.5000 mL | INJECTION | INTRAMUSCULAR | Status: DC
  Filled 2012-12-24 (×2): qty 0.5

## 2012-12-24 MED ORDER — KETOCONAZOLE 2 % EX CREA
1.0000 "application " | TOPICAL_CREAM | Freq: Every day | CUTANEOUS | Status: DC
  Administered 2012-12-26 (×2): 1 via TOPICAL
  Filled 2012-12-24 (×2): qty 15

## 2012-12-24 MED ORDER — ONDANSETRON HCL 4 MG/2ML IJ SOLN: 4.0000 mg | Freq: Four times a day (QID) | INTRAMUSCULAR | Status: DC | PRN

## 2012-12-24 MED ORDER — NYSTATIN 100000 UNIT/GM EX CREA
1.0000 "application " | TOPICAL_CREAM | Freq: Two times a day (BID) | CUTANEOUS | Status: DC
  Administered 2012-12-24 – 2012-12-26 (×4): 1 via TOPICAL
  Filled 2012-12-24: qty 15

## 2012-12-24 MED ORDER — VITAMIN D3 25 MCG (1000 UNIT) PO TABS
1000.0000 [IU] | ORAL_TABLET | Freq: Every day | ORAL | Status: DC
  Administered 2012-12-25 – 2012-12-26 (×2): 1000 [IU] via ORAL
  Filled 2012-12-24 (×2): qty 1

## 2012-12-24 MED ORDER — SODIUM CHLORIDE 0.9 % IV SOLN
INTRAVENOUS | Status: DC
  Administered 2012-12-24 (×2): via INTRAVENOUS
  Administered 2012-12-25: 1000 mL via INTRAVENOUS
  Administered 2012-12-26: 02:00:00 via INTRAVENOUS

## 2012-12-24 MED ORDER — ONDANSETRON HCL 4 MG PO TABS: 4.0000 mg | ORAL_TABLET | Freq: Four times a day (QID) | ORAL | Status: DC | PRN

## 2012-12-24 MED ORDER — LEVOTHYROXINE SODIUM 100 MCG PO TABS
100.0000 ug | ORAL_TABLET | Freq: Every day | ORAL | Status: DC
  Administered 2012-12-25 – 2012-12-26 (×2): 100 ug via ORAL
  Filled 2012-12-24 (×4): qty 1

## 2012-12-24 MED ORDER — HYDROCODONE-ACETAMINOPHEN 5-325 MG PO TABS
1.0000 | ORAL_TABLET | ORAL | Status: DC | PRN
  Administered 2012-12-25 – 2012-12-26 (×5): 1 via ORAL
  Filled 2012-12-24 (×5): qty 1

## 2012-12-24 MED ORDER — ACETAMINOPHEN 500 MG PO TABS
1000.0000 mg | ORAL_TABLET | Freq: Once | ORAL | Status: AC
  Administered 2012-12-24: 1000 mg via ORAL
  Filled 2012-12-24: qty 2

## 2012-12-24 NOTE — Progress Notes (Signed)
Pt.  complained of chest pain at 1835, non-radiating, at L upper chest, pain scale "8"/10, similar to pain felt earlier in the ED and again at approx 1500 here; pt dyspneic. BP 139/58, 95% room air, HR 96, resps 30. EKG done. No change from earlier EKG in ER. Patient comfortable at this time. MD paged with the above and request for pain med.

## 2012-12-24 NOTE — ED Notes (Signed)
Pt still unable to void. Pt aware of the need for urine

## 2012-12-24 NOTE — ED Notes (Signed)
Pt [place]: [phone]

## 2012-12-24 NOTE — ED Notes (Signed)
Pt in via [facility] c/o lower L back pain. Pt denies injury. Pt A&O

## 2012-12-24 NOTE — Progress Notes (Signed)
Pt refused to attempt to supply a sputum culture.  Asked pt several times, to which he replied he didn't have anything to cough up and would not attempt to cough for RN.

## 2012-12-24 NOTE — ED Notes (Signed)
Patient notified that we needed a urine specimen, given urinal,states he will attempt to urinate.

## 2012-12-24 NOTE — ED Notes (Signed)
Patient to urinate, still unable

## 2012-12-24 NOTE — ED Provider Notes (Addendum)
History     CSN: [id number]  Arrival date & time 12/24/12  0746   First MD Initiated Contact with Patient 12/24/12 [phone]      Chief Complaint  Patient presents with  . Back Pain    (Consider location/radiation/quality/duration/timing/severity/associated sxs/prior treatment) HPI Patient has vague historian complains of pain at left scapula, nonradiating onset last night pain is worse with movement improved with rest he is presently asymptomatic no cough no shortness of breath no other complaint no abdominal pain presently hungry. No treatment prior to coming here. No other associated symptoms Past Medical History  Diagnosis Date  . Chronic kidney disease 09/20/2008  . Paranoid schizophrenia   . Hiatal hernia   . NHL (non-Hodgkin's lymphoma) dx'd 08/2008 rt groin    chemo comp 08/2008  . CLL (chronic lymphoblastic leukemia) 10/2003  . cll dx'd 10/2003    no rx  . Non Hodgkin's lymphoma 08/2008  . GERD (gastroesophageal reflux disease)     History reviewed. No pertinent past surgical history. Orthopedic surgery History reviewed. No pertinent family history.  History  Substance Use Topics  . Smoking status: Never Smoker   . Smokeless tobacco: Not on file  . Alcohol Use: No   Lives in group home   Review of Systems  Musculoskeletal: Positive for back pain.    Allergies  Review of patient's allergies indicates no known allergies.  Home Medications   Current Outpatient Rx  Name  Route  Sig  Dispense  Refill  . aspirin EC 81 MG tablet   Oral   Take 81 mg by mouth daily.         . benztropine (COGENTIN) 0.5 MG tablet   Oral   Take 0.5 mg by mouth daily before breakfast.          . chlorproMAZINE (THORAZINE) 10 MG tablet   Oral   Take 10 mg by mouth at bedtime.          . cholecalciferol (VITAMIN D) 1000 UNITS tablet   Oral   Take 1,000 Units by mouth daily.         . clozapine (CLOZARIL) 200 MG tablet   Oral   Take 200 mg by mouth 2 (two) times  daily.         Marland Kitchen docusate sodium (COLACE) 100 MG capsule   Oral   Take 100 mg by mouth daily.         Marland Kitchen escitalopram (LEXAPRO) 10 MG tablet   Oral   Take 10 mg by mouth daily before breakfast.          . ketoconazole (NIZORAL) 2 % cream   Topical   Apply 1 application topically daily.         Marland Kitchen levothyroxine (SYNTHROID, LEVOTHROID) 100 MCG tablet   Oral   Take 100 mcg by mouth daily before breakfast.          . lithium carbonate 300 MG capsule   Oral   Take 300 mg by mouth 2 (two) times daily.         . mineral oil liquid   Oral   Take by mouth once a week. Instill 2 drops into both ears once weekly         . nystatin cream (MYCOSTATIN)   Topical   Apply 1 application topically 2 (two) times daily. Apply to sacral area         . Omega-3 Fatty Acids 1000 MG CPDR   Oral   Take  4,000 mg by mouth daily.          Marland Kitchen senna (SENOKOT) 8.6 MG tablet   Oral   Take 1 tablet by mouth daily.           BP 127/63  Pulse 97  Temp(Src) 98.7 F (37.1 C) (Oral)  Resp 18  SpO2 94%  Physical Exam  Nursing note and vitals reviewed. Constitutional: He appears well-developed and well-nourished.  HENT:  Head: Normocephalic and atraumatic.  Eyes: Conjunctivae are normal. Pupils are equal, round, and reactive to light.  Neck: Neck supple. No tracheal deviation present. No thyromegaly present.  Cardiovascular: Normal rate and regular rhythm.   No murmur heard. Pulmonary/Chest: Effort normal. He has rales.  Rales at left base  Abdominal: Soft. Bowel sounds are normal. He exhibits no distension. There is no tenderness.  Obese  Genitourinary: Penis normal.  Musculoskeletal: Normal range of motion. He exhibits no edema and no tenderness.  Neurological: He is alert. Coordination normal.  Gait normal  Skin: Skin is warm and dry. No rash noted.  Psychiatric: He has a normal mood and affect.    ED Course  Procedures (including critical care time)  Labs Reviewed -  No data to display No results found.  Date: 12/24/2012  Rate: 95  Rhythm: normal sinus rhythm  QRS Axis: normal  Intervals: normal  ST/T Wave abnormalities: nonspecific T wave changes  Conduction Disutrbances:right bundle branch block  Narrative Interpretation:   Old EKG Reviewed: no change from 04/27/12  No diagnosis found. Results for orders placed during the hospital encounter of 12/24/12  CBC WITH DIFFERENTIAL      Result Value Range   WBC 18.2 (*) 4.0 - 10.5 K/uL   RBC 3.92 (*) 4.22 - 5.81 MIL/uL   Hemoglobin 11.5 (*) 13.0 - 17.0 g/dL   HCT 35.6 (*) 39.0 - 52.0 %   MCV 90.8  78.0 - 100.0 fL   MCH 29.3  26.0 - 34.0 pg   MCHC 32.3  30.0 - 36.0 g/dL   RDW 13.5  11.5 - 15.5 %   Platelets 192  150 - 400 K/uL   Neutrophils Relative 87 (*) 43 - 77 %   Neutro Abs 15.7 (*) 1.7 - 7.7 K/uL   Lymphocytes Relative 7 (*) 12 - 46 %   Lymphs Abs 1.2  0.7 - 4.0 K/uL   Monocytes Relative 6  3 - 12 %   Monocytes Absolute 1.2 (*) 0.1 - 1.0 K/uL   Eosinophils Relative 0  0 - 5 %   Eosinophils Absolute 0.0  0.0 - 0.7 K/uL   Basophils Relative 0  0 - 1 %   Basophils Absolute 0.0  0.0 - 0.1 K/uL  POCT I-STAT, CHEM 8      Result Value Range   Sodium 138  135 - 145 mEq/L   Potassium 4.6  3.5 - 5.1 mEq/L   Chloride 105  96 - 112 mEq/L   BUN 21  6 - 23 mg/dL   Creatinine, Ser 1.80 (*) 0.50 - 1.35 mg/dL   Glucose, Bld 215 (*) 70 - 99 mg/dL   Calcium, Ion 1.19  1.12 - 1.23 mmol/L   TCO2 27  0 - 100 mmol/L   Hemoglobin 11.2 (*) 13.0 - 17.0 g/dL   HCT 33.0 (*) 39.0 - 52.0 %   Dg Chest 2 View  12/24/2012  *RADIOLOGY REPORT*  Clinical Data: 62 year old male chest pain and altered mental status.  CHEST - 2 VIEW  Comparison:  03/22/2012 and earlier.  Findings: Semi upright AP portable view 0929 hours.  Increased retrocardiac opacity, now mostly obscuring the left hemidiaphragm. The confluent in the lower lobe on the lateral.  Lower lung volumes.  Stable cardiac size and mediastinal contours.   Visualized tracheal air column is within normal limits.  No pneumothorax.  No definite pleural effusion.  IMPRESSION: Left lower lobe consolidation, nonspecific but suspicious for acute pneumonia.   Original Report Authenticated By: Roselyn Reef, M.D.       MDM  Spoke with Dr. Charlies Silvers, based Rales leukocytosis clinically patient has pneumonia Plan admit for 23 hour observation medical surgical floor Antibiotics Diagnosis#1 community-acquired pneumonia #2 renal insufficiency #3 hyperglycemia      Orlie Dakin, MD 12/24/12 1053  Orlie Dakin, MD 12/24/12 1053

## 2012-12-24 NOTE — ED Notes (Signed)
[id number] Expected date:12/24/12<BR> Expected time: 7:39 AM<BR> Means of arrival:Ambulance<BR> Comments:<BR> Nontramatic back pain

## 2012-12-24 NOTE — H&P (Signed)
[facility] History and Physical  AKIDA URMAN [id number] DOB: 09-06-1951 DOA: 12/24/2012  Referring physician: ER physician PCP: No primary provider on file.   Chief Complaint: left scapular pain  HPI:  62 year old male with past medical history including but not limited to chronic kidney disease, schizophrenia, non-Hodgkin's lymphoma, from group home who presented to emergency department due to sudden onset left sided scapular pain started one day prior to this admission. Pain does not radiate, it is not specifically aggravated by movement or breathing. No associated cough, fever or chills. No shortness of breath. No reports of abdominal pain, no nausea or vomiting. No lightheadedness or loss of consciousness. In ED, CXR done revealed left sided consolidation concerning for development of pneumonia. His WBC count was elevated at 18.2. Creatinine was 1.8 which is slightly above his baseline of 1.4.  Assessment and Plan:  Principal Problem:   CAP (community acquired pneumonia)  Started azithromycin and ceftriaxone  Follow up blood culture results  Follow up flu test, legionella and strep pneumoniae results Active Problems: Leukocytosis  Secondary to pneumonia  Follow up CBC in am Anemia  Secondary to CKD  Continue to monitor  No signs of active bleed   CKD (chronic kidney disease), stage III  Creatinine 1.8 on admission  Likely secondary to acute infection and possible dehydration  Continue IV fluids   Schizophrenia   continue home meds   Leisa Lenz [facility] [id number]  Review of Systems:  Constitutional: Negative for fever, chills and malaise/fatigue. Negative for diaphoresis.  HENT: Negative for hearing loss, ear pain, nosebleeds, congestion, sore throat, neck pain, tinnitus and ear discharge.   Eyes: Negative for blurred vision, double vision, photophobia, pain, discharge and redness.  Respiratory: no shortness of breath, no wheezing.    Cardiovascular: Negative for chest pain, palpitations, orthopnea, claudication and leg swelling.  Gastrointestinal: Negative for nausea, vomiting and abdominal pain. Negative for heartburn, constipation, blood in stool and melena.  Genitourinary: Negative for dysuria, urgency, frequency, hematuria and flank pain.  Musculoskeletal: Negative for myalgias, back pain, joint pain and falls.  Skin: Negative for itching and rash.  Neurological: Negative for dizziness and weakness. Negative for tingling, tremors, sensory change, speech change, focal weakness, loss of consciousness and headaches.  Endo/Heme/Allergies: Negative for environmental allergies and polydipsia. Does not bruise/bleed easily.  Psychiatric/Behavioral: Negative for suicidal ideas. The patient is not nervous/anxious.      Past Medical History  Diagnosis Date  . Chronic kidney disease 09/20/2008  . Paranoid schizophrenia   . Hiatal hernia   . NHL (non-Hodgkin's lymphoma) dx'd 08/2008 rt groin    chemo comp 08/2008  . CLL (chronic lymphoblastic leukemia) 10/2003  . cll dx'd 10/2003    no rx  . Non Hodgkin's lymphoma 08/2008  . GERD (gastroesophageal reflux disease)    History reviewed. No pertinent past surgical history. Social History:  reports that he has never smoked. He does not have any smokeless tobacco history on file. He reports that he does not drink alcohol or use illicit drugs.  No Known Allergies  Family History: htn in mother  Prior to Admission medications   Medication Sig Start Date End Date Taking? Authorizing Provider  aspirin EC 81 MG tablet Take 81 mg by mouth daily.   Yes Historical Provider, MD  benztropine (COGENTIN) 0.5 MG tablet Take 0.5 mg by mouth daily before breakfast.    Yes Historical Provider, MD  chlorproMAZINE (THORAZINE) 10 MG tablet Take 10 mg by mouth at bedtime.  Yes Historical Provider, MD  cholecalciferol (VITAMIN D) 1000 UNITS tablet Take 1,000 Units by mouth daily.   Yes  Historical Provider, MD  clozapine (CLOZARIL) 200 MG tablet Take 200 mg by mouth 2 (two) times daily.   Yes Historical Provider, MD  docusate sodium (COLACE) 100 MG capsule Take 100 mg by mouth daily.   Yes Historical Provider, MD  escitalopram (LEXAPRO) 10 MG tablet Take 10 mg by mouth daily before breakfast.    Yes Historical Provider, MD  ketoconazole (NIZORAL) 2 % cream Apply 1 application topically daily.   Yes Historical Provider, MD  levothyroxine (SYNTHROID, LEVOTHROID) 100 MCG tablet Take 100 mcg by mouth daily before breakfast.    Yes Historical Provider, MD  lithium carbonate 300 MG capsule Take 300 mg by mouth 2 (two) times daily.   Yes Historical Provider, MD  mineral oil liquid Take by mouth once a week. Instill 2 drops into both ears once weekly   Yes Historical Provider, MD  nystatin cream (MYCOSTATIN) Apply 1 application topically 2 (two) times daily. Apply to sacral area   Yes Historical Provider, MD  Omega-3 Fatty Acids 1000 MG CPDR Take 4,000 mg by mouth daily.    Yes Historical Provider, MD  senna (SENOKOT) 8.6 MG tablet Take 1 tablet by mouth daily.   Yes Historical Provider, MD   Physical Exam: Filed Vitals:   12/24/12 0749  BP: 127/63  Pulse: 97  Temp: 98.7 F (37.1 C)  TempSrc: Oral  Resp: 18  SpO2: 94%    Physical Exam  Constitutional: Appears well-developed and well-nourished. No distress.  HENT: Normocephalic. External right and left ear normal. Oropharynx is clear and moist.  Eyes: Conjunctivae and EOM are normal. PERRLA, no scleral icterus.  Neck: Normal ROM. Neck supple. No JVD. No tracheal deviation. No thyromegaly.  CVS: RRR, S1/S2 +, no murmurs, no gallops, no carotid bruit.  Pulmonary: diminished breath sounds, rales in left lower/mid lobe.  Abdominal: Soft. BS +,  no distension, tenderness, rebound or guarding.  Musculoskeletal: Normal range of motion. No edema and no tenderness.  Lymphadenopathy: No lymphadenopathy noted, cervical,  inguinal. Neuro: Alert. Normal reflexes, muscle tone coordination. No cranial nerve deficit. Skin: Skin is warm and dry. No rash noted. Not diaphoretic. No erythema. No pallor.  Psychiatric: Normal mood and affect. Behavior, judgment, thought content normal.   Labs on Admission:  Basic Metabolic Panel:  Recent Labs Lab 12/24/12 0925  NA 138  K 4.6  CL 105  GLUCOSE 215*  BUN 21  CREATININE 1.80*    CBC:  Recent Labs Lab 12/24/12 0918 12/24/12 0925  WBC 18.2*  --   NEUTROABS 15.7*  --   HGB 11.5* 11.2*  HCT 35.6* 33.0*  MCV 90.8  --   PLT 192  --    Cardiac Enzymes: No results found for this basename: CKTOTAL, CKMB, CKMBINDEX, TROPONINI,  in the last 168 hours BNP: No components found with this basename: POCBNP,  CBG: No results found for this basename: GLUCAP,  in the last 168 hours  Radiological Exams on Admission: Dg Chest 2 View 12/24/2012  *RADIOLOGY REPORT*  Clinical Data: 62 year old male chest pain and altered mental status.  CHEST - 2 VIEW  Comparison: 03/22/2012 and earlier.  Findings: Semi upright AP portable view 0929 hours.  Increased retrocardiac opacity, now mostly obscuring the left hemidiaphragm. The confluent in the lower lobe on the lateral.  Lower lung volumes.  Stable cardiac size and mediastinal contours.  Visualized tracheal air column is  within normal limits.  No pneumothorax.  No definite pleural effusion.  IMPRESSION: Left lower lobe consolidation, nonspecific but suspicious for acute pneumonia.   Original Report Authenticated By: Roselyn Reef, M.D.     EKG: Normal sinus rhythm, no ST/T wave changes  Code Status: Full Family Communication: Pt at bedside Disposition Plan: Admit for further evaluation  Leisa Lenz, MD  [facility] Pager [phone]  If 7PM-7AM, please contact night-coverage [email or web address] Password [facility] 12/24/2012, 10:55 AM

## 2012-12-24 NOTE — Progress Notes (Signed)
[facility] progress not Chief complaint. Chest pain. History of present illness. This 62 year old male admitted earlier today with pneumonia. Patient apparently complained of chest pain in the ER in about 15:00 today. Pain was felt to be noncardiac at that time. Patient again complaining of left sided chest pain rated 8/10. I was notified and came to the bedside to evaluate the patient. He is complaining of pain under left breast that also radiates to the left lateral chest. There is no radiation to the left jaw or left arm. Patient denies concurrent nausea or diaphoresis. Patient denies any prior known history of coronary artery disease. A 12-lead EKG was obtained and this appears unchanged from prior EKG done earlier today in the emergency room. Vital signs. Temperature 97.2, pulse 86, respiration 20, blood pressure 121/39. O2 sats 100%. General appearance. Well-developed elderly male who is alert, cooperative, and in no distress. Cardiac. Rate and rhythm regular. Lungs. Rales in the left base. No distress and stable O2 sats. Abdomen. Soft and obese with positive bowel sounds. No pain. Impression/plan. Problem #1. Chest pain. Given the location of pain and lack of change in EKG I suspect pain originating from left lung pneumonia rather than cardiac. Nonetheless I will obtain troponin every 6 hours for 3 sets. We'll follow for these results and contact cardiology if elevated. We'll increase patient's analgesics to Norco 5/325 one tablet every 4 hours as needed. Repeat EKG in 6 hours to evaluate for any changes.

## 2012-12-24 NOTE — ED Notes (Signed)
I attempted 2x to get blood, was unsuccessful both times. RN Manuela Schwartz notified

## 2012-12-25 LAB — LEGIONELLA ANTIGEN, URINE

## 2012-12-25 LAB — COMPREHENSIVE METABOLIC PANEL
Alkaline Phosphatase: 66 U/L (ref 39–117)
BUN: 20 mg/dL (ref 6–23)
Chloride: 104 mEq/L (ref 96–112)
GFR calc Af Amer: 53 mL/min — ABNORMAL LOW (ref >90)
Glucose, Bld: 170 mg/dL — ABNORMAL HIGH (ref 70–99)
Potassium: 4.2 mEq/L (ref 3.5–5.1)
Total Bilirubin: 0.2 mg/dL — ABNORMAL LOW (ref 0.3–1.2)

## 2012-12-25 LAB — GLUCOSE, CAPILLARY: Glucose-Capillary: 144 mg/dL — ABNORMAL HIGH (ref 70–99)

## 2012-12-25 LAB — BASIC METABOLIC PANEL
CO2: 23 mEq/L (ref 19–32)
Chloride: 105 mEq/L (ref 96–112)
Creatinine, Ser: 1.52 mg/dL — ABNORMAL HIGH (ref 0.50–1.35)
GFR calc Af Amer: 56 mL/min — ABNORMAL LOW (ref >90)
Potassium: 4.4 mEq/L (ref 3.5–5.1)
Sodium: 136 mEq/L (ref 135–145)

## 2012-12-25 LAB — CBC
HCT: 35.7 % — ABNORMAL LOW (ref 39.0–52.0)
Hemoglobin: 11.7 g/dL — ABNORMAL LOW (ref 13.0–17.0)
MCHC: 31.7 g/dL (ref 30.0–36.0)
MCV: 91.1 fL (ref 78.0–100.0)
MCV: 91.1 fL (ref 78.0–100.0)
Platelets: 177 10*3/uL (ref 150–400)
RBC: 3.92 MIL/uL — ABNORMAL LOW (ref 4.22–5.81)
RDW: 13.7 % (ref 11.5–15.5)
WBC: 16.2 10*3/uL — ABNORMAL HIGH (ref 4.0–10.5)
WBC: 19 10*3/uL — ABNORMAL HIGH (ref 4.0–10.5)

## 2012-12-25 LAB — TROPONIN I: Troponin I: <0.3 ng/mL (ref <0.30)

## 2012-12-25 MED ORDER — PNEUMOCOCCAL VAC POLYVALENT 25 MCG/0.5ML IJ INJ
0.5000 mL | INJECTION | INTRAMUSCULAR | Status: AC
  Administered 2012-12-26: 0.5 mL via INTRAMUSCULAR
  Filled 2012-12-25 (×2): qty 0.5

## 2012-12-25 NOTE — Progress Notes (Signed)
[facility] PROGRESS NOTE  [name] [id number] DOB: November 18, 1950 DOA: 12/24/2012 PCP: No primary provider on file.  Brief narrative: 62 year old male with past medical history including but not limited to chronic kidney disease, schizophrenia, non-Hodgkin's lymphoma, from group home who presented to emergency department due to sudden onset left sided scapular pain started one day prior to this admission.  In ED, CXR done revealed left sided consolidation concerning for development of pneumonia. His WBC count was elevated at 18.2. Creatinine was 1.8 which is slightly above his baseline of 1.4.   Assessment and Plan:   Principal Problem:  CAP (community acquired pneumonia)  Started azithromycin and ceftriaxone  Follow up blood culture results  Follow up flu test, legionella and strep pneumoniae results Active Problems:  Leukocytosis  Secondary to pneumonia  WBC count trending down Anemia of chronic disease  Secondary to CKD  Continue to monitor  No signs of active bleed CKD (chronic kidney disease), stage III  Creatinine 1.8 on admission  Likely secondary to acute infection and possible dehydration  Continue IV fluids Creatinine trending down already Schizophrenia  continue home meds  Code Status: full code Family Communication: no family at bedside Disposition Plan: to group home likely in am  Leisa Lenz, MD  [facility] Pager [phone]  If 7PM-7AM, please contact night-coverage [email or web address] Password [email or web address] 12/25/2012, 8:57 AM   LOS: 1 day   Consultants:  None   Procedures:  None   Antibiotics:  Azithromycin 12/24/2012 -->  Ceftriaxone 12/24/3012 -->  HPI/Subjective: No acute overnight events.  Objective: Filed Vitals:   12/24/12 1219 12/24/12 1342 12/24/12 2106 12/25/12 0530  BP: 112/58 121/39 133/61 129/72  Pulse: 88 86 94 107  Temp: 99.3 F (37.4 C) 97.2 F (36.2 C) 98.3 F (36.8 C) 98.5 F (36.9 C)  TempSrc: Oral Oral Oral Oral  Resp: 24  20 20 20   Height:  6' (1.829 m)    Weight:  130.2 kg (287 lb 0.6 oz)  131.5 kg (289 lb 14.5 oz)  SpO2: 95% 100% 95% 95%    Intake/Output Summary (Last 24 hours) at 12/25/12 0857 Last data filed at 12/25/12 0531  Gross per 24 hour  Intake   1365 ml  Output      3 ml  Net   1362 ml    Exam:   General:  Pt is alert, follows commands appropriately, not in acute distress  Cardiovascular: Regular rate and rhythm, S1/S2, no murmurs, no rubs, no gallops  Respiratory: Clear to auscultation bilaterally, no wheezing, no crackles, no rhonchi  Abdomen: Soft, non tender, non distended, bowel sounds present, no guarding  Extremities: No edema, pulses DP and PT palpable bilaterally  Neuro: Grossly nonfocal  Data Reviewed: Basic Metabolic Panel:  Recent Labs Lab 12/24/12 0925 12/24/12 1921 12/25/12 0130  NA 138 138 136  K 4.6 4.3 4.2  CL 105 102 104  CO2  --  26 24  GLUCOSE 215* 190* 170*  BUN 21 21 20   CREATININE 1.80* 1.61* 1.59*  CALCIUM  --  8.7 8.2*  MG  --  2.1  --   PHOS  --  3.0  --    Liver Function Tests:  Recent Labs Lab 12/24/12 1921 12/25/12 0130  AST 10 10  ALT 10 9  ALKPHOS 72 66  BILITOT 0.3 0.2*  PROT 6.5 6.1  ALBUMIN 2.8* 2.6*   No results found for this basename: LIPASE, AMYLASE,  in the last 168 hours No results found for this basename:  AMMONIA,  in the last 168 hours CBC:  Recent Labs Lab 12/24/12 0918 12/24/12 0925 12/24/12 1740 12/25/12 0130  WBC 18.2*  --  17.4* 16.2*  NEUTROABS 15.7*  --  14.8*  --   HGB 11.5* 11.2* 12.3* 11.1*  HCT 35.6* 33.0* 37.4* 35.0*  MCV 90.8  --  91.4 91.1  PLT 192  --  180 177   Cardiac Enzymes:  Recent Labs Lab 12/24/12 1921 12/25/12 0130 12/25/12 0704  TROPONINI <0.30 <0.30 <0.30   BNP: No components found with this basename: POCBNP,  CBG:  Recent Labs Lab 12/24/12 2026 12/25/12 0756  GLUCAP 179* 144*    No results found for this or any previous visit (from the past 240 hour(s)).    Studies: Dg Chest 2 View  12/24/2012  *RADIOLOGY REPORT*  Clinical Data: 62 year old male chest pain and altered mental status.  CHEST - 2 VIEW  Comparison: 03/22/2012 and earlier.  Findings: Semi upright AP portable view 0929 hours.  Increased retrocardiac opacity, now mostly obscuring the left hemidiaphragm. The confluent in the lower lobe on the lateral.  Lower lung volumes.  Stable cardiac size and mediastinal contours.  Visualized tracheal air column is within normal limits.  No pneumothorax.  No definite pleural effusion.  IMPRESSION: Left lower lobe consolidation, nonspecific but suspicious for acute pneumonia.   Original Report Authenticated By: Roselyn Reef, M.D.     Scheduled Meds: . aspirin EC  81 mg Oral Daily  . azithromycin  500 mg Intravenous Q24H  . benztropine  0.5 mg Oral QAC breakfast  . cefTRIAXone (ROCEPHIN)  IV  1 g Intravenous Q24H  . chlorproMAZINE  10 mg Oral QHS  . cholecalciferol  1,000 Units Oral Daily  . clozapine  200 mg Oral BID  . docusate sodium  100 mg Oral Daily  . escitalopram  10 mg Oral Daily  . ketoconazole  1 application Topical Daily  . levothyroxine  100 mcg Oral QAC breakfast  . lithium carbonate  300 mg Oral BID  . [START ON 12/26/2012] mineral oil   Otic Weekly  . nystatin cream  1 application Topical BID  . omega-3 acid ethyl esters  4,000 mg Oral Daily  . pneumococcal 23 valent vaccine  0.5 mL Intramuscular Tomorrow-1000  . senna  1 tablet Oral Daily   Continuous Infusions: . sodium chloride 75 mL/hr at 12/24/12 2128

## 2012-12-25 NOTE — Progress Notes (Signed)
Utilization review completed.  

## 2012-12-26 ENCOUNTER — Inpatient Hospital Stay (HOSPITAL_COMMUNITY): Payer: Medicare Other

## 2012-12-26 LAB — CBC
HCT: 34.7 % — ABNORMAL LOW (ref 39.0–52.0)
Hemoglobin: 11.2 g/dL — ABNORMAL LOW (ref 13.0–17.0)
MCH: 29.6 pg (ref 26.0–34.0)
MCHC: 32.3 g/dL (ref 30.0–36.0)
MCV: 91.6 fL (ref 78.0–100.0)
RBC: 3.79 MIL/uL — ABNORMAL LOW (ref 4.22–5.81)

## 2012-12-26 LAB — BASIC METABOLIC PANEL
BUN: 21 mg/dL (ref 6–23)
CO2: 24 mEq/L (ref 19–32)
Calcium: 8.9 mg/dL (ref 8.4–10.5)
GFR calc non Af Amer: 46 mL/min — ABNORMAL LOW (ref >90)
Glucose, Bld: 158 mg/dL — ABNORMAL HIGH (ref 70–99)

## 2012-12-26 MED ORDER — MINERAL OIL PO OIL
5.0000 mL | TOPICAL_OIL | ORAL | Status: DC
  Administered 2012-12-26: 5 mL via OTIC
  Filled 2012-12-26 (×2): qty 5

## 2012-12-26 MED ORDER — LEVOFLOXACIN 750 MG PO TABS: 750.0000 mg | ORAL_TABLET | Freq: Every day | ORAL | Status: DC

## 2012-12-26 NOTE — Progress Notes (Signed)
Physical Therapy Treatment Patient Details Name: [name] MRN: [id number] DOB: 03-10-1951 Today's Date: 12/26/2012 Time: [id number] PT Time Calculation (min): 24 min  PT Assessment / Plan / Recommendation Comments on Treatment Session  Pt improved in ambulation distance today and has O2 sats > 90  on room air.  Pt will need close supervision upon return to group home, and will benefit from continued PT with HHPT    Follow Up Recommendations  Home health PT     Does the patient have the potential to tolerate intense rehabilitation     Barriers to Discharge        Equipment Recommendations  Rolling walker with 5" wheels (?? will continue to assess and see if this will be a need)    Recommendations for Other Services    Frequency Min 3X/week   Plan Discharge plan remains appropriate;Frequency remains appropriate    Precautions / Restrictions Precautions Precautions: Fall Restrictions Weight Bearing Restrictions: No   Pertinent Vitals/Pain C/o pain in his "nervous system"    Mobility  Bed Mobility Bed Mobility: Supine to Sit;Sit to Supine Supine to Sit: 4: Min assist;With rails;HOB elevated Sit to Supine: 4: Min assist;With rail;HOB flat Transfers Transfers: Sit to Stand;Stand to Sit Sit to Stand: 4: Min assist Stand to Sit: 4: Min assist Details for Transfer Assistance: cues neede for RW safety and use and saefty for turning completely, etc.  Ambulation/Gait Ambulation/Gait Assistance: 4: Min assist;Other (comment) (+2 for safety due to unpredicatble this initial visit) Ambulation Distance (Feet): 75 Feet Assistive device: Rolling walker Ambulation/Gait Assistance Details: assistance to control speed, safety, cues to stand erect, though pt tended to stay flexed  Pt with dyspnea on exertion , but O2 sats 93 % when walking on Room air Gait Pattern: Shuffle;Decreased step length - right;Decreased step length - left;Trunk flexed General Gait Details: pt tends to be  impulsive and needs hands on assist for safety  Stairs: No Wheelchair Mobility Wheelchair Mobility: No    Exercises     PT Diagnosis: Difficulty walking;Generalized weakness  PT Problem List: Decreased strength;Decreased activity tolerance;Decreased mobility;Decreased knowledge of use of DME PT Treatment Interventions: Gait training;Functional mobility training;Therapeutic activities;Therapeutic exercise   PT Goals Acute Rehab PT Goals PT Goal Formulation: With patient Time For Goal Achievement: 01/08/13 Potential to Achieve Goals: Good Pt will go Sit to Supine/Side: with supervision Pt will go Sit to Stand: with supervision PT Goal: Sit to Stand - Progress: Progressing toward goal Pt will go Stand to Sit: with supervision PT Goal: Stand to Sit - Progress: Progressing toward goal Pt will Ambulate: 16 - 50 feet;with supervision;with least restrictive assistive device;with rolling walker PT Goal: Ambulate - Progress: Progressing toward goal  Visit Information  Last PT Received On: 12/26/12 Assistance Needed: +2 (safety)    Subjective Data  Subjective: I hurt in my nervous system Patient Stated Goal: to get to the bathroom to urinate   Cognition  Cognition Overall Cognitive Status: Impaired Area of Impairment: Following commands;Safety/judgement;Awareness of errors;Problem solving Arousal/Alertness: Awake/alert Orientation Level: Person Behavior During Session: WFL for tasks performed Following Commands: Follows one step commands inconsistently    Balance  Balance Balance Assessed: Yes Static Sitting Balance Static Sitting - Balance Support: No upper extremity supported;Feet supported Static Sitting - Comment/# of Minutes: keeps trunk flexed with head forward on  chest.  Needs cues to extend trunk Static Standing Balance Static Standing - Balance Support: Bilateral upper extremity supported Static Standing - Level of Assistance: 4: Min assist  Static Standing - Comment/#  of Minutes: 2 trials, first trial, pt stood to urinate in bedside commode.  He needed min assist to stand and verbal cues to keep knees straight for stability. Second attempt, only 30 seconds of standing before pt moves to sit down,  Needs cues to reach back for armrests  End of Session PT - End of Session Equipment Utilized During Treatment: Gait belt Activity Tolerance: Patient tolerated treatment well Patient left: in chair Nurse Communication: Mobility status   GP    Helene Kelp K. Owens Shark, [place] 12/26/2012, 11:36 AM

## 2012-12-26 NOTE — Progress Notes (Signed)
Patient is to be discharging back to group home today/ group home use [facility] for home health care services;talked to [place] with [facility]-  orders for [facility] faxed to [place] as requested. Mindi Slicker RN,BSN,MHA

## 2012-12-26 NOTE — Discharge Summary (Signed)
Physician Discharge Summary  [name] [id number] DOB: 1951/09/07 DOA: 12/24/2012  PCP: No primary provider on file.  Admit date: 12/24/2012 Discharge date: 12/26/2012  Recommendations for Outpatient Follow-up:  1. Follow up with PCP in 1-2 weeks if symptoms persist 2. Take Levaquin for next 7 days on discharge  Discharge Diagnoses:  Principal Problem:   CAP (community acquired pneumonia) Active Problems:   Leukocytosis   Anemia   CKD (chronic kidney disease), stage III   Schizophrenia  Discharge Condition: medically stable for discharge to group home today  Diet recommendation: as tolerated  History of present illness:  62 year old male with past medical history including but not limited to chronic kidney disease, schizophrenia, non-Hodgkin's lymphoma, from group home who presented to emergency department due to sudden onset left sided scapular pain started one day prior to this admission.  In ED, CXR done revealed left sided consolidation concerning for development of pneumonia. His WBC count was elevated at 18.2. Creatinine was 1.8 which is slightly above his baseline of 1.4.   Assessment and Plan:  Principal Problem:  CAP (community acquired pneumonia)  Started azithromycin and ceftriaxone. We will discharge home with Levaquin for 7 days. Follow up blood culture results - negative Follow up flu test, legionella and strep pneumoniae results - negative Active Problems:  Leukocytosis  Secondary to pneumonia  WBC count trending down Anemia of chronic disease  Secondary to CKD  Stable and no signs of bleed CKD (chronic kidney disease), stage III  Creatinine 1.8 on admission  Likely secondary to acute infection and possible dehydration  Creatinine  trending down, 1.5 today Schizophrenia  continue home meds   Code Status: full code  Family Communication: no family at bedside  Disposition Plan: to group home today  Leisa Lenz, MD  [facility]  Pager [phone]    Consultants:  None  Procedures:  None  Antibiotics:  Azithromycin 12/24/2012 --> 12/26/2012 Ceftriaxone 12/24/3012 --> 12/26/2012   Discharge Exam: Filed Vitals:   12/26/12 0531  BP: 144/71  Pulse: 106  Temp: 97.5 F (36.4 C)  Resp: 28   Filed Vitals:   12/25/12 1440 12/25/12 1555 12/25/12 2125 12/26/12 0531  BP: 132/61 139/65 120/50 144/71  Pulse: 98 100 98 106  Temp: 99.2 F (37.3 C)  100.5 F (38.1 C) 97.5 F (36.4 C)  TempSrc: Oral  Oral Oral  Resp: 22 20 20 28   Height:      Weight:      SpO2: 96% 94% 95% 93%    General: Pt is alert, follows commands appropriately, not in acute distress Cardiovascular: Regular rate and rhythm, S1/S2 +, no murmurs, no rubs, no gallops Respiratory: Clear to auscultation bilaterally, no wheezing, no crackles, no rhonchi Abdominal: Soft, non tender, non distended, bowel sounds +, no guarding Extremities: no edema, no cyanosis, pulses palpable bilaterally DP and PT Neuro: Grossly nonfocal  Discharge Instructions  Discharge Orders   Future Orders Complete By Expires     Call MD for:  difficulty breathing, headache or visual disturbances  As directed     Call MD for:  persistant dizziness or light-headedness  As directed     Call MD for:  persistant nausea and vomiting  As directed     Call MD for:  severe uncontrolled pain  As directed     Diet - low sodium heart healthy  As directed     Increase activity slowly  As directed         Medication List  TAKE these medications       aspirin EC 81 MG tablet  Take 81 mg by mouth daily.     benztropine 0.5 MG tablet  Commonly known as:  COGENTIN  Take 0.5 mg by mouth daily before breakfast.     chlorproMAZINE 10 MG tablet  Commonly known as:  THORAZINE  Take 10 mg by mouth at bedtime.     cholecalciferol 1000 UNITS tablet  Commonly known as:  VITAMIN D  Take 1,000 Units by mouth daily.     clozapine 200 MG tablet  Commonly known as:  CLOZARIL  Take 200 mg by mouth 2  (two) times daily.     docusate sodium 100 MG capsule  Commonly known as:  COLACE  Take 100 mg by mouth daily.     escitalopram 10 MG tablet  Commonly known as:  LEXAPRO  Take 10 mg by mouth daily before breakfast.     ketoconazole 2 % cream  Commonly known as:  NIZORAL  Apply 1 application topically daily.     levofloxacin 750 MG tablet  Commonly known as:  LEVAQUIN  Take 1 tablet (750 mg total) by mouth daily.     levothyroxine 100 MCG tablet  Commonly known as:  SYNTHROID, LEVOTHROID  Take 100 mcg by mouth daily before breakfast.     lithium carbonate 300 MG capsule  Take 300 mg by mouth 2 (two) times daily.     mineral oil liquid  Take by mouth once a week. Instill 2 drops into both ears once weekly     nystatin cream  Commonly known as:  MYCOSTATIN  Apply 1 application topically 2 (two) times daily. Apply to sacral area     Omega-3 Fatty Acids 1000 MG Cpdr  Take 4,000 mg by mouth daily.     senna 8.6 MG tablet  Commonly known as:  SENOKOT  Take 1 tablet by mouth daily.          The results of significant diagnostics from this hospitalization (including imaging, microbiology, ancillary and laboratory) are listed below for reference.    Significant Diagnostic Studies: Dg Chest 2 View  12/24/2012  *RADIOLOGY REPORT*  Clinical Data: 62 year old male chest pain and altered mental status.  CHEST - 2 VIEW  Comparison: 03/22/2012 and earlier.  Findings: Semi upright AP portable view 0929 hours.  Increased retrocardiac opacity, now mostly obscuring the left hemidiaphragm. The confluent in the lower lobe on the lateral.  Lower lung volumes.  Stable cardiac size and mediastinal contours.  Visualized tracheal air column is within normal limits.  No pneumothorax.  No definite pleural effusion.  IMPRESSION: Left lower lobe consolidation, nonspecific but suspicious for acute pneumonia.   Original Report Authenticated By: Roselyn Reef, M.D.    Dg Chest Port 1 View  12/26/2012   *RADIOLOGY REPORT*  Clinical Data: Short of breath.  Lymphoma.  PORTABLE CHEST - 1 VIEW  Comparison: 12/24/2012  Findings: Soft tissue obscures the aortic knob with mediastinal prominence.  Left pleural and pulmonary parenchymal changes have increased.  Right lung is grossly clear.  No pneumothorax.  IMPRESSION: Increasing left lung pleural and parenchymal changes worrisome for increasing pleural effusion and airspace disease.  Findings worrisome for mediastinal mass effect.  CT scan is recommended.   Original Report Authenticated By: Marybelle Killings, M.D.     Microbiology: Recent Results (from the past 240 hour(s))  CULTURE, BLOOD (ROUTINE X 2)     Status: None   Collection Time  12/24/12 10:45 AM      Result Value Range Status   Specimen Description BLOOD LEFT HAND   Final   Special Requests BOTTLES DRAWN AEROBIC AND ANAEROBIC 5CC EACH   Final   Culture  Setup Time 12/24/2012 15:19   Final   Culture     Final   Value:        BLOOD CULTURE RECEIVED NO GROWTH TO DATE CULTURE WILL BE HELD FOR 5 DAYS BEFORE ISSUING A FINAL NEGATIVE REPORT   Report Status PENDING   Incomplete  CULTURE, BLOOD (ROUTINE X 2)     Status: None   Collection Time    12/24/12 11:06 AM      Result Value Range Status   Specimen Description BLOOD RIGHT WRIST   Final   Special Requests BOTTLES DRAWN AEROBIC AND ANAEROBIC 5CC EACH   Final   Culture  Setup Time 12/24/2012 15:19   Final   Culture     Final   Value:        BLOOD CULTURE RECEIVED NO GROWTH TO DATE CULTURE WILL BE HELD FOR 5 DAYS BEFORE ISSUING A FINAL NEGATIVE REPORT   Report Status PENDING   Incomplete     Labs: Basic Metabolic Panel:  Recent Labs Lab 12/24/12 0925 12/24/12 1921 12/25/12 0130 12/25/12 0849 12/26/12 0402  NA 138 138 136 136 137  K 4.6 4.3 4.2 4.4 4.4  CL 105 102 104 105 105  CO2  --  26 24 23 24   GLUCOSE 215* 190* 170* 172* 158*  BUN 21 21 20 19 21   CREATININE 1.80* 1.61* 1.59* 1.52* 1.58*  CALCIUM  --  8.7 8.2* 8.4 8.9  MG   --  2.1  --   --   --   PHOS  --  3.0  --   --   --    Liver Function Tests:  Recent Labs Lab 12/24/12 1921 12/25/12 0130  AST 10 10  ALT 10 9  ALKPHOS 72 66  BILITOT 0.3 0.2*  PROT 6.5 6.1  ALBUMIN 2.8* 2.6*   No results found for this basename: LIPASE, AMYLASE,  in the last 168 hours No results found for this basename: AMMONIA,  in the last 168 hours CBC:  Recent Labs Lab 12/24/12 0918 12/24/12 0925 12/24/12 1740 12/25/12 0130 12/25/12 0849 12/26/12 0402  WBC 18.2*  --  17.4* 16.2* 19.0* 16.6*  NEUTROABS 15.7*  --  14.8*  --   --   --   HGB 11.5* 11.2* 12.3* 11.1* 11.7* 11.2*  HCT 35.6* 33.0* 37.4* 35.0* 35.7* 34.7*  MCV 90.8  --  91.4 91.1 91.1 91.6  PLT 192  --  180 177 191 187   Cardiac Enzymes:  Recent Labs Lab 12/24/12 1921 12/25/12 0130 12/25/12 0704  TROPONINI <0.30 <0.30 <0.30   BNP: BNP (last 3 results) No results found for this basename: PROBNP,  in the last 8760 hours CBG:  Recent Labs Lab 12/24/12 2026 12/25/12 0756 12/26/12 0812  GLUCAP 179* 144* 202*    Time coordinating discharge: Over 30 minutes  Signed:  Leisa Lenz, MD  [facility]  12/26/2012, 9:34 AM  Pager #: [phone]

## 2012-12-26 NOTE — Progress Notes (Signed)
Pt for discharge back to [facility].   Discharge packet provided in [place] and ambulance transport ([facility]) arranged.   Notified facility and pt guardian.   No further social work needs identified.   CSW signing off.   Drake Leach, MSW, [place] Work [phone]

## 2012-12-26 NOTE — Evaluation (Signed)
Physical Therapy Evaluation Patient Details Name: [name] MRN: [id number] DOB: 22-Nov-1950 Today's Date: 12/26/2012 Time: 0930-1004 PT Time Calculation (min): 34 min  PT Assessment / Plan / Recommendation Clinical Impression       PT Assessment  Patient needs continued PT services    Follow Up Recommendations  Home health PT    Does the patient have the potential to tolerate intense rehabilitation      Barriers to Discharge        Equipment Recommendations  Rolling walker with 5" wheels (?? will continue to assess and see if this will be a need)    Recommendations for Other Services     Frequency Min 3X/week    Precautions / Restrictions Precautions Precautions: Fall Restrictions Weight Bearing Restrictions: No   Pertinent Vitals/Pain Some complaints of pain in low back , describes as "sciatica"     Mobility  Bed Mobility Bed Mobility: Supine to Sit;Sit to Supine Supine to Sit: 4: Min assist;With rails;HOB elevated Sit to Supine: 4: Min assist;With rail;HOB flat Transfers Transfers: Sit to Stand;Stand to Sit Sit to Stand: 4: Min assist Stand to Sit: 4: Min assist Details for Transfer Assistance: cues neede for RW safety and use and saefty for turning completely, etc.  Ambulation/Gait Ambulation/Gait Assistance: 4: Min assist;Other (comment) (+2 for safety due to unpredicatble this initial visit) Assistive device: Rolling walker    Exercises     PT Diagnosis: Difficulty walking;Generalized weakness  PT Problem List: Decreased strength;Decreased activity tolerance;Decreased mobility;Decreased knowledge of use of DME PT Treatment Interventions: Gait training;Functional mobility training;Therapeutic activities;Therapeutic exercise   PT Goals Acute Rehab PT Goals PT Goal Formulation: With patient Time For Goal Achievement: 01/08/13 Potential to Achieve Goals: Good Pt will go Sit to Supine/Side: with supervision Pt will go Sit to Stand: with  supervision Pt will go Stand to Sit: with supervision Pt will Ambulate: 16 - 50 feet;with supervision;with least restrictive assistive device;with rolling walker  Visit Information  Last PT Received On: 12/25/12 Assistance Needed: +2    Subjective Data  Subjective: I was sick last night (referrring to when he got up with nursing to go to the bathroom, he urinated on floor as he tried to get to the bathroom. Preoccupied with all the "lines" he has going on , etc.  Patient Stated Goal: no goal stated by pt, except he would ike to get back to "his " room becasue he really enjoys it with is own refrigerator.     Prior Functioning  Home Living Lives With: Other (Comment) (in group home) Available Help at Discharge:  (unclear of his assistance available or PLOF in group home(?)) Prior Function Level of Independence:  (unclear ) Communication Communication: No difficulties    Cognition  Cognition Overall Cognitive Status: Appears within functional limits for tasks assessed/performed Orientation Level: Person Behavior During Session: [facility] for tasks performed    Extremity/Trunk Assessment Right Lower Extremity Assessment RLE ROM/Strength/Tone: [facility] for tasks assessed Left Lower Extremity Assessment LLE ROM/Strength/Tone: [facility] for tasks assessed Trunk Assessment Trunk Assessment:  (flexed posture in sitting and standing , corrects with cues)   Balance    End of Session PT - End of Session Equipment Utilized During Treatment: Gait belt Activity Tolerance: Patient tolerated treatment well Patient left: in chair;with nursing in room  GP     Alta Corning, PT Pager: [phone] 12/26/2012 (NOTE for 12/25/2012)

## 2012-12-26 NOTE — Progress Notes (Signed)
Clinical Social Work Department BRIEF PSYCHOSOCIAL ASSESSMENT 12/26/2012  Patient:  [name], [name]     Account Number:  [id number]     Admit date:  12/24/2012  Clinical Social Worker:  Ulyess Blossom  Date/Time:  12/26/2012 10:00 AM  Referred by:  Physician  Date Referred:  12/26/2012 Referred for  Other - See comment   Other Referral:   Admitted from [place] type:  Patient Other interview type:   and patient guardian, [name]    PSYCHOSOCIAL DATA Living Status:  FACILITY Admitted from facility:  OTHER Level of care:  Group Home Primary support name:  [name] Ali/guardian Primary support relationship to patient:   Degree of support available:   adequate    CURRENT CONCERNS Current Concerns  Post-Acute Placement   Other Concerns:    SOCIAL WORK ASSESSMENT / PLAN CSW received notification that pt admitted from group home.    CSW met with pt at bedside re: discharge plan. Per pt, he is a resident at [facility] and plan is to return.    CSW contacted facility who notified this CSW to speak with pt guardian, [name], but pt is fine to return to [place] does not need FL2.    CSW spoke with pt guardian, [name] via telephone. Pt guardian stated that pt would need to be transported back to [place] via ambulance.    CSW updated RN and to facilitate pt discharge needs this afternoon.   Assessment/plan status:  Psychosocial Support/Ongoing Assessment of Needs Other assessment/ plan:   discharge planning   Information/referral to community resources:   Referral back to [facility]    PATIENT'S/FAMILY'S RESPONSE TO PLAN OF CARE: Pt alert and oriented x 4. Pt and pt guardian agreeable to plan to return to [facility].     Drake Leach, MSW, [place] Work [phone]

## 2012-12-26 NOTE — Progress Notes (Signed)
Pt. Was discharged back to group home, report was called to group home. Transportation by ambulance was arranged for the pt. Discharge summary and prescriptions were sent with the pt.

## 2012-12-30 LAB — CULTURE, BLOOD (ROUTINE X 2): Culture: NO GROWTH

## 2013-08-25 ENCOUNTER — Telehealth: Payer: Self-pay | Admitting: Internal Medicine

## 2013-08-25 NOTE — Telephone Encounter (Signed)
caregiver walked in to sch appt for pt wanted pm given first pm avail  shh

## 2013-09-12 ENCOUNTER — Ambulatory Visit (HOSPITAL_BASED_OUTPATIENT_CLINIC_OR_DEPARTMENT_OTHER): Payer: Medicare Other | Admitting: Internal Medicine

## 2013-09-12 ENCOUNTER — Other Ambulatory Visit: Payer: Self-pay | Admitting: Internal Medicine

## 2013-09-12 ENCOUNTER — Telehealth: Payer: Self-pay | Admitting: Internal Medicine

## 2013-09-12 VITALS — BP 144/69 | HR 101 | Temp 97.9°F | Resp 18 | Ht 72.0 in | Wt 294.6 lb

## 2013-09-12 DIAGNOSIS — C9110 Chronic lymphocytic leukemia of B-cell type not having achieved remission: Secondary | ICD-10-CM

## 2013-09-12 DIAGNOSIS — C8590 Non-Hodgkin lymphoma, unspecified, unspecified site: Secondary | ICD-10-CM

## 2013-09-12 DIAGNOSIS — F209 Schizophrenia, unspecified: Secondary | ICD-10-CM

## 2013-09-12 DIAGNOSIS — Z8572 Personal history of non-Hodgkin lymphomas: Secondary | ICD-10-CM | POA: Insufficient documentation

## 2013-09-12 DIAGNOSIS — Z856 Personal history of leukemia: Secondary | ICD-10-CM | POA: Insufficient documentation

## 2013-09-12 DIAGNOSIS — F200 Paranoid schizophrenia: Secondary | ICD-10-CM

## 2013-09-12 DIAGNOSIS — C8589 Other specified types of non-Hodgkin lymphoma, extranodal and solid organ sites: Secondary | ICD-10-CM

## 2013-09-12 DIAGNOSIS — N189 Chronic kidney disease, unspecified: Secondary | ICD-10-CM

## 2013-09-12 NOTE — Telephone Encounter (Signed)
Gave pt appt for lab and MD on April 2015 , lab on Friday 10/31

## 2013-09-12 NOTE — Progress Notes (Signed)
Pt is at group home, they did not give a med list. Assume that the list has not changed.

## 2013-09-14 NOTE — Progress Notes (Signed)
[name] OFFICE PROGRESS NOTE  DIAGNOSIS: CLL (chronic lymphocytic leukemia) - Plan: CBC with Differential, Comprehensive metabolic panel, IgG, IgA, IgM  Non Hodgkin's lymphoma - Plan: CBC with Differential, Comprehensive metabolic panel, IgG, IgA, IgM, Lactate dehydrogenase  Chief Complaint  Patient presents with  . CLL  . Lymphoma   CURRENT THERAPY: Observation.  PREVIOUS THERAPY:   He completed a total of 7 cycles of multi-agent chemotherapy from late November 2009 through late March 2010.    INTERVAL HISTORY: [name] 62 y.o. male with a history of CLL and Non Hodgkins lymphoma is here for follow-up.   He was last seen by  Dr. Ralene Ok on 08/10/2011.  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  In addition, he has a history of chronic lymphocytic leukemia diagnosed in December 2004. He has been under observation until he developed the the non-hodgkins lymphoma as noted above.     [name] is here today once again with his power of attorney Gildardo Pounds.  [name] has a very prominent psychiatric history with a diagnosis of paranoid schizophrenia.  He resides at the [facility] group home.  He denies any symptoms of night sweats, weight loss or fevers. He reports gaining weight.   MEDICAL HISTORY: Past Medical History  Diagnosis Date  . Chronic kidney disease 09/20/2008  . Paranoid schizophrenia   . Hiatal hernia   . NHL (non-Hodgkin's lymphoma) dx'd 08/2008 rt groin    chemo comp 08/2008  . CLL (chronic lymphoblastic leukemia) 10/2003  . cll dx'd 10/2003    no rx  . Non Hodgkin's lymphoma 08/2008  . GERD (gastroesophageal reflux disease)     INTERIM HISTORY: has CAP (community acquired pneumonia); Leukocytosis; Anemia; CKD (chronic kidney disease), stage III; Schizophrenia; CLL (chronic lymphocytic leukemia); and Non Hodgkin's lymphoma on his problem list.    ALLERGIES:  has No Known  Allergies.  MEDICATIONS: has a current medication list which includes the following prescription(s): aspirin ec, benztropine, chlorpromazine, cholecalciferol, clozapine, docusate sodium, escitalopram, ketoconazole, levofloxacin, levothyroxine, lithium carbonate, mineral oil, nystatin cream, omega-3 fatty acids, and senna.  SURGICAL HISTORY: No past surgical history on file.  REVIEW OF SYSTEMS:   Constitutional: Denies fevers, chills or abnormal weight loss Eyes: Denies blurriness of vision Ears, nose, mouth, throat, and face: Denies mucositis or sore throat Respiratory: Denies cough, dyspnea or wheezes Cardiovascular: Denies palpitation, chest discomfort or lower extremity swelling Gastrointestinal:  Denies nausea, heartburn or change in bowel habits Skin: Denies abnormal skin rashes Lymphatics: Denies new lymphadenopathy or easy bruising Neurological:Denies numbness, tingling or new weaknesses Behavioral/Psych: Mood is stable, no new changes  All other systems were reviewed with the patient and are negative.  PHYSICAL EXAMINATION: ECOG PERFORMANCE STATUS: 0 - Asymptomatic  Blood pressure 144/69, pulse 101, temperature 97.9 F (36.6 C), temperature source Oral, resp. rate 18, height 6' (1.829 m), weight 294 lb 9.6 oz (133.63 kg), SpO2 96.00%.  GENERAL:alert, no distress and comfortable; Morbidly obese SKIN: skin color, texture, turgor are normal, no rashes or significant lesions EYES: normal, Conjunctiva are pink and non-injected, sclera clear OROPHARYNX:no exudate, no erythema and lips, buccal mucosa, and tongue normal  NECK: supple, thyroid normal size, non-tender, without nodularity LYMPH:  no palpable lymphadenopathy in the cervical, axillary or supraclavicular LUNGS: clear to auscultation and percussion with normal breathing effort HEART: regular rate & rhythm and no murmurs and no lower extremity edema ABDOMEN:abdomen soft, non-tender and normal bowel sounds  Musculoskeletal:no  cyanosis of digits and no clubbing  NEURO: alert & oriented x 3 with fluent speech, no focal motor/sensory deficits  Labs:  Lab Results  Component Value Date   WBC 16.6* 12/26/2012   HGB 11.2* 12/26/2012   HCT 34.7* 12/26/2012   MCV 91.6 12/26/2012   PLT 187 12/26/2012   NEUTROABS 14.8* 12/24/2012      Chemistry      Component Value Date/Time   NA 137 12/26/2012 0402   NA 144 12/07/2011 0826   K 4.4 12/26/2012 0402   K 4.3 12/07/2011 0826   CL 105 12/26/2012 0402   CL 107 12/07/2011 0826   CO2 24 12/26/2012 0402   CO2 26 12/07/2011 0826   BUN 21 12/26/2012 0402   BUN 17 12/07/2011 0826   CREATININE 1.58* 12/26/2012 0402   CREATININE 1.3* 12/07/2011 0826      Component Value Date/Time   CALCIUM 8.9 12/26/2012 0402   CALCIUM 9.3 12/07/2011 0826   ALKPHOS 66 12/25/2012 0130   ALKPHOS 81 12/07/2011 0826   AST 10 12/25/2012 0130   AST 21 12/07/2011 0826   ALT 9 12/25/2012 0130   ALT 26 12/07/2011 0826   BILITOT 0.2* 12/25/2012 0130   BILITOT 0.50 12/07/2011 0826      RADIOGRAPHIC STUDIES: CT CHEST, ABDOMEN AND PELVIS WITH CONTRAST  12/09/2011 Technique: Multidetector CT imaging of the chest, abdomen and pelvis was performed following the standard protocol during bolus  administration of intravenous contrast.  Contrast: 172mL OMNIPAQUE IOHEXOL 300 MG/ML IV SOLN 100 ml  Omnipaque-300.  Comparison: CT scan 02/06/2011 and PET CT 12/10/2008.  CT CHEST  Findings: The chest wall is stable. Asymmetric subareolar breast tissue on the right has been present since the PET CT from 2010. No supraclavicular or axillary lymphadenopathy. No destructive bone lesions or spinal canal compromise. The heart is normal in size. No pericardial effusion. No mediastinal or hilar lymphadenopathy. The aorta is normal in  caliber. No dissection. The branch vessels are unremarkable. The esophagus is grossly normal.  Examination of the lung parenchyma demonstrates no pulmonary nodules or pleural disease. The tracheobronchial tree  is unremarkable.  IMPRESSION:  Stable CT appearance of the chest. No findings for recurrent  lymphoma.  CT ABDOMEN AND PELVIS  Findings: The liver is unremarkable. No focal lesions. The  spleen is mildly enlarged measuring 14 x 14 x 10 cm. This is  stable. No focal lesions. The pancreas, adrenal glands and  kidneys are unremarkable and stable. 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 A The stomach, duodenum, small bowel and colon are unremarkable. The appendix is normal. No mesenteric or retroperitoneal masses or  lymphadenopathy. The aorta is normal in caliber. The major branch vessels are normal.  The prostate gland is enlarged but stable. Stable diffuse bladder wall thickening likely due to partial bladder outlet obstruction. Stable small cystic structure in the left lower pelvis just superior to the left seminal vesicle. No pelvic mass or lymphadenopathy. Small inguinal hernias containing fat are noted. No inguinal mass or adenopathy.  The bony structures are unremarkable. Stable degenerative changes in the spine.  IMPRESSION:  1. Stable mild splenomegaly but no focal lesions.  2. No abdominal or pelvic lymphadenopathy.  ASSESSMENT: TAEVON SENDRA 62 y.o. male with a history of CLL (chronic lymphocytic leukemia) - Plan: CBC with Differential, Comprehensive metabolic panel, IgG, IgA, IgM  Non Hodgkin's lymphoma - Plan: CBC with Differential, Comprehensive metabolic panel, IgG, IgA, IgM, Lactate dehydrogenase   PLAN:  1. Non Hodgkin's lymphoma involving the right groin. --Clinically, he denies symptoms of recurrence.  Her CT scan in January of 2013 revealed a stable CT appearance of the chest. No findings for recurrent lymphoma and stable mild splenomegaly but no focal lesions without abdominal or pelvic lymphadenopathy.  Continue to observe closely and scan with CT of abdomen/chest if symptoms.  2. CLL --His WBCs is 16.7 today.  He denies recurrent infections or  constitutional symptoms.  We will continue to observe.   3. Paranoid schizophrenia. --Continue current medications.   4. Obesity.  --He was counseled on continuing weight lost through exercise and diet.   5. History of chronic kidney disease.  --Creatinine remains stable.  Avoid nephrotoxins.   6. Follow-up.  --He will follow-up in six months with CBC, CMP and LDH.   All questions were answered. The patient knows to call the clinic with any problems, questions or concerns. We can certainly see the patient much sooner if necessary.  I spent 15 minutes counseling the patient face to face. The total time spent in the appointment was 25 minutes.    Todd Masoud, MD 09/14/2013 5:49 AM

## 2013-09-15 ENCOUNTER — Other Ambulatory Visit: Payer: Medicare Other | Admitting: Lab

## 2013-09-15 DIAGNOSIS — C8590 Non-Hodgkin lymphoma, unspecified, unspecified site: Secondary | ICD-10-CM

## 2013-09-15 DIAGNOSIS — C9110 Chronic lymphocytic leukemia of B-cell type not having achieved remission: Secondary | ICD-10-CM

## 2013-09-15 LAB — CBC WITH DIFFERENTIAL/PLATELET
BASO%: 0.5 % (ref 0.0–2.0)
Eosinophils Absolute: 0.2 10*3/uL (ref 0.0–0.5)
HCT: 41.8 % (ref 38.4–49.9)
LYMPH%: 18.5 % (ref 14.0–49.0)
MCHC: 32.8 g/dL (ref 32.0–36.0)
MONO#: 0.7 10*3/uL (ref 0.1–0.9)
NEUT#: 6.4 10*3/uL (ref 1.5–6.5)
NEUT%: 70.8 % (ref 39.0–75.0)
Platelets: 138 10*3/uL — ABNORMAL LOW (ref 140–400)
RBC: 4.65 10*6/uL (ref 4.20–5.82)
WBC: 9.1 10*3/uL (ref 4.0–10.3)
lymph#: 1.7 10*3/uL (ref 0.9–3.3)

## 2013-09-15 LAB — COMPREHENSIVE METABOLIC PANEL (CC13)
ALT: 16 U/L (ref 0–55)
AST: 15 U/L (ref 5–34)
Albumin: 3.7 g/dL (ref 3.5–5.0)
Anion Gap: 7 mEq/L (ref 3–11)
CO2: 24 mEq/L (ref 22–29)
Calcium: 10.4 mg/dL (ref 8.4–10.4)
Chloride: 111 mEq/L — ABNORMAL HIGH (ref 98–109)
Creatinine: 1.7 mg/dL — ABNORMAL HIGH (ref 0.7–1.3)
Potassium: 4.4 mEq/L (ref 3.5–5.1)
Total Protein: 6.6 g/dL (ref 6.4–8.3)

## 2013-09-15 LAB — IGG, IGA, IGM: IgM, Serum: 19 mg/dL — ABNORMAL LOW (ref 41–251)

## 2013-09-15 LAB — LACTATE DEHYDROGENASE (CC13): LDH: 144 U/L (ref 125–245)

## 2014-03-13 ENCOUNTER — Other Ambulatory Visit (HOSPITAL_BASED_OUTPATIENT_CLINIC_OR_DEPARTMENT_OTHER): Payer: Medicare Other

## 2014-03-13 ENCOUNTER — Encounter: Payer: Self-pay | Admitting: Internal Medicine

## 2014-03-13 ENCOUNTER — Ambulatory Visit (HOSPITAL_BASED_OUTPATIENT_CLINIC_OR_DEPARTMENT_OTHER): Payer: Medicare Other | Admitting: Internal Medicine

## 2014-03-13 ENCOUNTER — Telehealth: Payer: Self-pay | Admitting: Internal Medicine

## 2014-03-13 VITALS — BP 120/61 | HR 104 | Temp 98.2°F | Resp 19 | Ht 72.0 in | Wt 293.9 lb

## 2014-03-13 DIAGNOSIS — C8590 Non-Hodgkin lymphoma, unspecified, unspecified site: Secondary | ICD-10-CM

## 2014-03-13 DIAGNOSIS — E669 Obesity, unspecified: Secondary | ICD-10-CM

## 2014-03-13 DIAGNOSIS — N183 Chronic kidney disease, stage 3 unspecified: Secondary | ICD-10-CM

## 2014-03-13 DIAGNOSIS — C8589 Other specified types of non-Hodgkin lymphoma, extranodal and solid organ sites: Secondary | ICD-10-CM

## 2014-03-13 DIAGNOSIS — F209 Schizophrenia, unspecified: Secondary | ICD-10-CM

## 2014-03-13 DIAGNOSIS — C9110 Chronic lymphocytic leukemia of B-cell type not having achieved remission: Secondary | ICD-10-CM

## 2014-03-13 LAB — CBC WITH DIFFERENTIAL/PLATELET
BASO%: 0.4 % (ref 0.0–2.0)
Basophils Absolute: 0 10*3/uL (ref 0.0–0.1)
EOS ABS: 0.2 10*3/uL (ref 0.0–0.5)
EOS%: 2.9 % (ref 0.0–7.0)
HEMATOCRIT: 41.9 % (ref 38.4–49.9)
HEMOGLOBIN: 13.7 g/dL (ref 13.0–17.1)
LYMPH%: 17 % (ref 14.0–49.0)
MCH: 30.2 pg (ref 27.2–33.4)
MCHC: 32.7 g/dL (ref 32.0–36.0)
MCV: 92.3 fL (ref 79.3–98.0)
MONO#: 0.4 10*3/uL (ref 0.1–0.9)
MONO%: 4.8 % (ref 0.0–14.0)
NEUT%: 74.9 % (ref 39.0–75.0)
NEUTROS ABS: 6.4 10*3/uL (ref 1.5–6.5)
PLATELETS: 109 10*3/uL — AB (ref 140–400)
RBC: 4.54 10*6/uL (ref 4.20–5.82)
RDW: 14.1 % (ref 11.0–14.6)
WBC: 8.6 10*3/uL (ref 4.0–10.3)
lymph#: 1.5 10*3/uL (ref 0.9–3.3)

## 2014-03-13 LAB — COMPREHENSIVE METABOLIC PANEL (CC13)
ALT: 13 U/L (ref 0–55)
ANION GAP: 8 meq/L (ref 3–11)
AST: 15 U/L (ref 5–34)
Albumin: 3.7 g/dL (ref 3.5–5.0)
Alkaline Phosphatase: 70 U/L (ref 40–150)
BILIRUBIN TOTAL: 0.23 mg/dL (ref 0.20–1.20)
BUN: 17.7 mg/dL (ref 7.0–26.0)
CALCIUM: 10.1 mg/dL (ref 8.4–10.4)
CO2: 25 meq/L (ref 22–29)
CREATININE: 1.8 mg/dL — AB (ref 0.7–1.3)
Chloride: 110 mEq/L — ABNORMAL HIGH (ref 98–109)
GLUCOSE: 167 mg/dL — AB (ref 70–140)
Potassium: 4.3 mEq/L (ref 3.5–5.1)
Sodium: 143 mEq/L (ref 136–145)
TOTAL PROTEIN: 6.3 g/dL — AB (ref 6.4–8.3)

## 2014-03-13 NOTE — Telephone Encounter (Signed)
Gave pt appt for lab and MD for October 2015 °

## 2014-03-13 NOTE — Progress Notes (Signed)
[place] [place] [facility] OFFICE PROGRESS NOTE  DIAGNOSIS: CLL (chronic lymphocytic leukemia)  Non Hodgkin's lymphoma  Schizophrenia  CKD (chronic kidney disease), stage III  Chief Complaint  Patient presents with  . CLL (chronic lymphocytic leukemia)   CURRENT THERAPY: Observation.  PREVIOUS THERAPY:   He completed a total of 7 cycles of multi-agent chemotherapy from late November 2009 through late March 2010.    INTERVAL HISTORY: [name] 63 y.o. male with a history of CLL and Non Hodgkins lymphoma is here for follow-up.   He was last seen by me on 09/12/2013.  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  In addition, he has a history of chronic lymphocytic leukemia diagnosed in December 2004. He has been under observation until he developed the the non-hodgkins lymphoma as noted above.     [name] is here today once again with his power of attorney [name].  [name] has a very prominent psychiatric history with a diagnosis of paranoid schizophrenia.  He resides at the [facility].  He denies any symptoms of night sweats, weight loss or fevers. He has not had any hospitalizations or emergency room visits.   MEDICAL HISTORY: Past Medical History  Diagnosis Date  . Chronic kidney disease 09/20/2008  . Paranoid schizophrenia   . Hiatal hernia   . NHL (non-Hodgkin's lymphoma) dx'd 08/2008 rt groin    chemo comp 08/2008  . CLL (chronic lymphoblastic leukemia) 10/2003  . cll dx'd 10/2003    no rx  . Non Hodgkin's lymphoma 08/2008  . GERD (gastroesophageal reflux disease)     INTERIM HISTORY: has CAP (community acquired pneumonia); Leukocytosis; Anemia; CKD (chronic kidney disease), stage III; Schizophrenia; CLL (chronic lymphocytic leukemia); and Non Hodgkin's lymphoma on his problem list.    ALLERGIES:  has No Known Allergies.  MEDICATIONS: has a current medication list which includes the  following prescription(s): aspirin ec, benztropine, chlorpromazine, cholecalciferol, clozapine, docusate sodium, escitalopram, ketoconazole, levothyroxine, lithium carbonate, nystatin cream, omega-3 fatty acids, senna, and mineral oil.  SURGICAL HISTORY: No past surgical history on file.  REVIEW OF SYSTEMS:   Constitutional: Denies fevers, chills or abnormal weight loss Eyes: Denies blurriness of vision Ears, nose, mouth, throat, and face: Denies mucositis or sore throat Respiratory: Denies cough, dyspnea or wheezes Cardiovascular: Denies palpitation, chest discomfort or lower extremity swelling Gastrointestinal:  Denies nausea, heartburn or change in bowel habits Skin: Denies abnormal skin rashes Lymphatics: Denies new lymphadenopathy or easy bruising Neurological:Denies numbness, tingling or new weaknesses Behavioral/Psych: Mood is stable, no new changes  All other systems were reviewed with the patient and are negative.  PHYSICAL EXAMINATION: ECOG PERFORMANCE STATUS: 0 - Asymptomatic  Blood pressure 120/61, pulse 104, temperature 98.2 F (36.8 C), temperature source Oral, resp. rate 19, height 6' (1.829 m), weight 293 lb 14.4 oz (133.312 kg), SpO2 97.00%.  GENERAL:alert, no distress and comfortable; Morbidly obese SKIN: skin color, texture, turgor are normal, no rashes or significant lesions EYES: normal, Conjunctiva are pink and non-injected, sclera clear OROPHARYNX:no exudate, no erythema and lips, buccal mucosa, and tongue normal  NECK: supple, thyroid normal size, non-tender, without nodularity LYMPH:  no palpable lymphadenopathy in the cervical, axillary or supraclavicular LUNGS: clear to auscultation and percussion with normal breathing effort HEART: Tachycardic and and no murmurs and no lower extremity edema ABDOMEN:abdomen soft, non-tender and normal bowel sounds Musculoskeletal:no cyanosis of digits and no clubbing  NEURO: alert & oriented x 3  with fluent speech, no focal  motor/sensory deficits  Labs:  Lab Results  Component Value Date   WBC 8.6 03/13/2014   HGB 13.7 03/13/2014   HCT 41.9 03/13/2014   MCV 92.3 03/13/2014   PLT 109* 03/13/2014   NEUTROABS 6.4 03/13/2014      Chemistry      Component Value Date/Time   NA 143 03/13/2014 1255   NA 137 12/26/2012 0402   NA 144 12/07/2011 0826   K 4.3 03/13/2014 1255   K 4.4 12/26/2012 0402   K 4.3 12/07/2011 0826   CL 105 12/26/2012 0402   CL 107 12/07/2011 0826   CO2 25 03/13/2014 1255   CO2 24 12/26/2012 0402   CO2 26 12/07/2011 0826   BUN 17.7 03/13/2014 1255   BUN 21 12/26/2012 0402   BUN 17 12/07/2011 0826   CREATININE 1.8* 03/13/2014 1255   CREATININE 1.58* 12/26/2012 0402   CREATININE 1.3* 12/07/2011 0826      Component Value Date/Time   CALCIUM 10.1 03/13/2014 1255   CALCIUM 8.9 12/26/2012 0402   CALCIUM 9.3 12/07/2011 0826   ALKPHOS 70 03/13/2014 1255   ALKPHOS 66 12/25/2012 0130   ALKPHOS 81 12/07/2011 0826   AST 15 03/13/2014 1255   AST 10 12/25/2012 0130   AST 21 12/07/2011 0826   ALT 13 03/13/2014 1255   ALT 9 12/25/2012 0130   ALT 26 12/07/2011 0826   BILITOT 0.23 03/13/2014 1255   BILITOT 0.2* 12/25/2012 0130   BILITOT 0.50 12/07/2011 0826      RADIOGRAPHIC STUDIES: CT CHEST, ABDOMEN AND PELVIS WITH CONTRAST  12/09/2011 Technique: Multidetector CT imaging of the chest, abdomen and pelvis was performed following the standard protocol during bolus  administration of intravenous contrast.  Contrast: 117mL OMNIPAQUE IOHEXOL 300 MG/ML IV SOLN 100 ml  Omnipaque-300.  Comparison: CT scan 02/06/2011 and PET CT 12/10/2008.  CT CHEST  Findings: The chest wall is stable. Asymmetric subareolar breast tissue on the right has been present since the PET CT from 2010. No supraclavicular or axillary lymphadenopathy. No destructive bone lesions or spinal canal compromise. The heart is normal in size. No pericardial effusion. No mediastinal or hilar lymphadenopathy. The aorta is normal in  caliber. No dissection. The  branch vessels are unremarkable. The esophagus is grossly normal.  Examination of the lung parenchyma demonstrates no pulmonary nodules or pleural disease. The tracheobronchial tree is unremarkable.  IMPRESSION:  Stable CT appearance of the chest. No findings for recurrent  lymphoma.  CT ABDOMEN AND PELVIS  Findings: The liver is unremarkable. No focal lesions. The  spleen is mildly enlarged measuring 14 x 14 x 10 cm. This is  stable. No focal lesions. The pancreas, adrenal glands and  kidneys are unremarkable and stable. 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 A The stomach, duodenum, small bowel and colon are unremarkable. The appendix is normal. No mesenteric or retroperitoneal masses or  lymphadenopathy. The aorta is normal in caliber. The major branch vessels are normal.  The prostate gland is enlarged but stable. Stable diffuse bladder wall thickening likely due to partial bladder outlet obstruction. Stable small cystic structure in the left lower pelvis just superior to the left seminal vesicle. No pelvic mass or lymphadenopathy. Small inguinal hernias containing fat are noted. No inguinal mass or adenopathy.  The bony structures are unremarkable. Stable degenerative changes in the spine.  IMPRESSION:  1. Stable mild splenomegaly but no focal lesions.  2. No abdominal or pelvic lymphadenopathy.  ASSESSMENT: [name] 63 y.o. male with a history of CLL (chronic lymphocytic leukemia)  Non Hodgkin's lymphoma  Schizophrenia  CKD (chronic kidney disease), stage III   PLAN:  1. Non Hodgkin's lymphoma involving the right groin. --Clinically, he denies symptoms of recurrence.  His CT scan in January of 2013 revealed a stable CT appearance of the chest. No findings for recurrent lymphoma and stable mild splenomegaly but no focal lesions without abdominal or pelvic lymphadenopathy.  Continue to observe closely and scan with CT of abdomen/chest if symptoms.  2. CLL --His  WBCs is 8.6 today.  He denies recurrent infections or constitutional symptoms.  We will continue to observe.   3. Thrombocytopenia, mild --He denies bleeding.   4. Paranoid schizophrenia. --Continue current medications.   5. Obesity.  --He was counseled on continuing weight lost through exercise and diet.   6. History of chronic kidney disease.  --Creatinine remains stable.  Avoid nephrotoxins.   7. Follow-up.  --He will follow-up in six months with CBC, CMP and LDH.   All questions were answered. The patient knows to call the clinic with any problems, questions or concerns. We can certainly see the patient much sooner if necessary.  I spent 15 minutes counseling the patient face to face. The total time spent in the appointment was 25 minutes.    Concha Norway, MD 03/13/2014 1:56 PM

## 2014-03-14 LAB — IGG, IGA, IGM
IGG (IMMUNOGLOBIN G), SERUM: 562 mg/dL — AB (ref 650–1600)
IGM, SERUM: 19 mg/dL — AB (ref 41–251)
IgA: 77 mg/dL (ref 68–379)

## 2014-05-21 ENCOUNTER — Telehealth: Payer: Self-pay | Admitting: Cardiovascular Disease

## 2014-05-21 NOTE — Telephone Encounter (Signed)
New message    Wants to know why patient has to find another cardiology.    Message was left on voicemail  7/3

## 2014-05-21 NOTE — Telephone Encounter (Signed)
Spoke w/Sham at [facility] re: appointment on 7/14 w/Dr. Fletcher Anon. States the facility where he is ([facility]) sent them a message as FYI for them regarding the appointment. She did not know the reason that pt was to see Dr. Fletcher Anon or information regarding him having a stress test.  Spoke w/[name] Ancrum Orthoptist) at [facility] facility who states the appointment on 7/14 was for a nuclear stress test per order from Dr. Reymundo Poll.  Advised him  that appointment was an office visit.  He states that the treadmill that was done in 2013 pt was unable to walk so Dr. Fredderick Phenix wanted him to have nuclear stress test. Reason for nuclear stress test was for SOB and abn EKG done 2013. Advised will send message to Dr. Tyrell Antonio nurse Weyman Liston to decide if pt needs to be seen as OV or if can just schedule him for nuclear stress.  [name] states Mr. Hietpas weighs 290 lbs and can not walk on treadmill. His phone number is [phone]

## 2014-05-29 ENCOUNTER — Encounter: Payer: Self-pay | Admitting: Cardiovascular Disease

## 2014-05-29 ENCOUNTER — Ambulatory Visit (INDEPENDENT_AMBULATORY_CARE_PROVIDER_SITE_OTHER): Payer: Medicare Other | Admitting: Cardiovascular Disease

## 2014-05-29 VITALS — BP 108/70 | HR 89 | Ht 72.0 in | Wt 295.0 lb

## 2014-05-29 DIAGNOSIS — R0989 Other specified symptoms and signs involving the circulatory and respiratory systems: Secondary | ICD-10-CM

## 2014-05-29 DIAGNOSIS — R0602 Shortness of breath: Secondary | ICD-10-CM

## 2014-05-29 DIAGNOSIS — R0609 Other forms of dyspnea: Secondary | ICD-10-CM

## 2014-05-29 NOTE — Assessment & Plan Note (Signed)
The patient is a poor historian but he does report progressive exertional dyspnea without chest discomfort. Given his risk factors for coronary artery disease, I agree with ischemic cardiac evaluation. He could not exercise on a treadmill in the past. Thus, I requested a pharmacologic nuclear stress test. Hopefully, he would be able to hold still for the imaging part. The other explanation for his dyspnea might be related to physical deconditioning and obesity.

## 2014-05-29 NOTE — Telephone Encounter (Signed)
The pt was seen by Dr Fletcher Anon today and [facility] scheduled.

## 2014-05-29 NOTE — Patient Instructions (Signed)
Your physician recommends that you schedule a follow-up appointment in:  AS NEEDED Your physician recommends that you continue on your current medications as directed. Please refer to the Current Medication list given to you today.  Your physician has requested that you have a lexiscan myoview. For further information please visit [email or web address]. Please follow instruction sheet, as given.

## 2014-05-29 NOTE — Progress Notes (Signed)
Primary care physician: Dr. Reymundo Poll at [facility]  HPI  This is a 63 year old man who was referred for evaluation of exertional dyspnea. He has no previous cardiac history. He has extensive psychiatric history including schizophrenia and has been living in a [facility] for many years. He is a very poor historian. He has chronic medical conditions that include hypertension, chronic kidney disease, hyperthyroidism, hyperlipidemia, chronic lower extremity edema and non-Hodgkin's lymphoma. He reports progressive symptoms of exertional dyspnea without chest pain. He denies orthopnea or PND. He has chronic mild lower extremity edema. He does not smoke and he does not know his family history.  No Known Allergies   Current Outpatient Prescriptions on File Prior to Visit  Medication Sig Dispense Refill  . aspirin EC 81 MG tablet Take 81 mg by mouth daily.      . benztropine (COGENTIN) 0.5 MG tablet Take 0.5 mg by mouth daily before breakfast.       . chlorproMAZINE (THORAZINE) 10 MG tablet Take 10 mg by mouth at bedtime.       . cholecalciferol (VITAMIN D) 1000 UNITS tablet Take 1,000 Units by mouth daily.      . clozapine (CLOZARIL) 200 MG tablet Take 200 mg by mouth 2 (two) times daily.      Marland Kitchen docusate sodium (COLACE) 100 MG capsule Take 100 mg by mouth daily.      Marland Kitchen escitalopram (LEXAPRO) 10 MG tablet Take 10 mg by mouth daily before breakfast.       . ketoconazole (NIZORAL) 2 % cream Apply 1 application topically daily.      Marland Kitchen levothyroxine (SYNTHROID, LEVOTHROID) 100 MCG tablet Take 100 mcg by mouth daily before breakfast.       . lithium carbonate 300 MG capsule Take 300 mg by mouth 2 (two) times daily.      . mineral oil liquid Take by mouth once a week. Instill 2 drops into both ears once weekly      . Omega-3 Fatty Acids 1000 MG CPDR Take 4,000 mg by mouth daily.       Marland Kitchen senna (SENOKOT) 8.6 MG tablet Take 1 tablet by mouth daily.       No current facility-administered  medications on file prior to visit.     Past Medical History  Diagnosis Date  . Chronic kidney disease 09/20/2008  . Paranoid schizophrenia   . Hiatal hernia   . NHL (non-Hodgkin's lymphoma) dx'd 08/2008 rt groin    chemo comp 08/2008  . CLL (chronic lymphoblastic leukemia) 10/2003  . cll dx'd 10/2003    no rx  . Non Hodgkin's lymphoma 08/2008  . GERD (gastroesophageal reflux disease)      No past surgical history on file.   No family history on file.   History   Social History  . Marital Status: Single    Spouse Name: N/A    Number of Children: N/A  . Years of Education: N/A   Occupational History  . Not on file.   Social History Main Topics  . Smoking status: Never Smoker   . Smokeless tobacco: Not on file  . Alcohol Use: No  . Drug Use: No  . Sexual Activity: Not on file   Other Topics Concern  . Not on file   Social History Narrative  . No narrative on file     ROS A 10 point review of system was performed. It is negative other than that mentioned in the history of  present illness.   PHYSICAL EXAM   BP 108/70  Pulse 89  Ht 6' (1.829 m)  Wt 295 lb (133.811 kg)  BMI 40.00 kg/m2 Constitutional: He is oriented to person, place, and time. He appears well-developed and well-nourished. No distress.  HENT: No nasal discharge.  Head: Normocephalic and atraumatic.  Eyes: Pupils are equal and round.  No discharge. Neck: Normal range of motion. Neck supple. No JVD present. No thyromegaly present.  Cardiovascular: Normal rate, regular rhythm, normal heart sounds. Exam reveals no gallop and no friction rub. No murmur heard.  Pulmonary/Chest: Effort normal and breath sounds normal. No stridor. No respiratory distress. He has no wheezes. He has no rales. He exhibits no tenderness.  Abdominal: Soft. Bowel sounds are normal. He exhibits no distension. There is no tenderness. There is no rebound and no guarding.  Musculoskeletal: Normal range of motion. He  exhibits no edema and no tenderness.  Neurological: He is alert and oriented to person, place, and time. Coordination normal.  Skin: Skin is warm and dry. No rash noted. He is not diaphoretic. No erythema. No pallor.  Psychiatric: He has a normal mood and affect. His behavior is normal. Judgment and thought content normal.       EKG: Normal sinus rhythm with right bundle branch block.   ASSESSMENT AND PLAN

## 2014-06-11 ENCOUNTER — Ambulatory Visit (HOSPITAL_COMMUNITY): Payer: Medicare Other | Attending: Cardiology | Admitting: Radiology

## 2014-06-11 VITALS — BP 106/69 | HR 88 | Ht 72.0 in | Wt 291.0 lb

## 2014-06-11 DIAGNOSIS — R0602 Shortness of breath: Secondary | ICD-10-CM

## 2014-06-11 DIAGNOSIS — R0609 Other forms of dyspnea: Secondary | ICD-10-CM | POA: Diagnosis not present

## 2014-06-11 DIAGNOSIS — R0989 Other specified symptoms and signs involving the circulatory and respiratory systems: Principal | ICD-10-CM | POA: Insufficient documentation

## 2014-06-11 DIAGNOSIS — R42 Dizziness and giddiness: Secondary | ICD-10-CM | POA: Insufficient documentation

## 2014-06-11 MED ORDER — REGADENOSON 0.4 MG/5ML IV SOLN
0.4000 mg | Freq: Once | INTRAVENOUS | Status: AC
  Administered 2014-06-11: 0.4 mg via INTRAVENOUS

## 2014-06-11 MED ORDER — TECHNETIUM TC 99M SESTAMIBI GENERIC - CARDIOLITE
30.0000 | Freq: Once | INTRAVENOUS | Status: AC | PRN
  Administered 2014-06-11: 30 via INTRAVENOUS

## 2014-06-11 NOTE — Progress Notes (Signed)
[facility] [address] [place], [place] [address] [phone]    Cardiology Nuclear Med Study  [name] is a 63 y.o. male     MRN : [id number]     DOB: 03/15/1951  Procedure Date: 06/11/2014  Nuclear Med Background Indication for Stress Test:  Evaluation for Ischemia History:  No known CAD, Echo 2009 EF 70%, Dobutamine Echo 2013 Cardiac Risk Factors: CVA, Hypertension, Lipids and RBBB  Symptoms:  Dizziness and DOE   Nuclear Pre-Procedure Caffeine/Decaff Intake:  None> 12 hrs NPO After: 6:00pm   Lungs:  clear O2 Sat: 91% on room air. IV 0.9% NS with Angio Cath:  22g  IV Site: R Hand x 1, tolerated well IV Started by:  Irven Baltimore, RN  Chest Size (in):  56 Cup Size: n/a  Height: 6' (1.829 m)  Weight:  291 lb (131.997 kg)  BMI:  Body mass index is 39.46 kg/(m^2). Tech Comments:  Patient took am medications. Irven Baltimore, RN.    Nuclear Med Study 1 or 2 day study: 2 day  Stress Test Type:  Carlton Adam  Reading MD: N/A  Order Authorizing Provider:  Kathlyn Sacramento, MD  Resting Radionuclide: Technetium 76m Sestamibi  Resting Radionuclide Dose: 33.0 mCi  On      06-14-14  Stress Radionuclide:  Technetium 76m Sestamibi  Stress Radionuclide Dose: 33.0 mCi  On        06-11-14          Stress Protocol Rest HR:88 Stress HR: 97  Rest BP: 106/69 Stress BP: 110/68  Exercise Time (min): n/a METS: n/a           Dose of Adenosine (mg):  n/a Dose of Lexiscan: 0.4 mg  Dose of Atropine (mg): n/a Dose of Dobutamine: n/a mcg/kg/min (at max HR)  Stress Test Technologist: Glade Lloyd, BS-ES  Nuclear Technologist:  Vedia Pereyra, CNMT     Rest Procedure:  Myocardial perfusion imaging was performed at rest 45 minutes following the intravenous administration of Technetium 29m Sestamibi. Rest ECG: NSR - Normal EKG and NSR-RBBB  Stress Procedure:  The patient received IV Lexiscan 0.4 mg over 15-seconds.  Technetium 36m Sestamibi injected at  30-seconds.  Quantitative spect images were obtained after a 45 minute delay.  During the infusion of Lexiscan the patient complained of SOB, neck tightness and fatigue.  These symptoms began to resolve in recovery.  Stress ECG: No significant change from baseline ECG  QPS Raw Data Images:  Substantial diaphragmatic attenuation.  Normal left ventricular size. Stress Images:  There is decreased uptake in the inferior wall. SSS 6 Rest Images:  There is limited improvement in uptake in the inferior wall. SDS 5 Subtraction (SDS):  There is a fixed inferior defect that is most consistent with diaphragmatic attenuation. There appears to be a mild degree of superimposed ischemia Transient Ischemic Dilatation (Normal <1.22):  1.01 Lung/Heart Ratio (Normal <0.45):  0.30  Quantitative Gated Spect Images QGS EDV:  87 ml QGS ESV:  39 ml  Impression Exercise Capacity:  Lexiscan with no exercise. BP Response:  Normal blood pressure response. Clinical Symptoms:  No significant symptoms noted. ECG Impression:  No significant ST segment change suggestive of ischemia. Comparison with Prior Nuclear Study: No previous nuclear study performed  Overall Impression:  Low risk stress nuclear study with possible mild inferior wall ischemia on a background of marked diaphragmatic attenuation artifact. Study specificity is limited by the patient's body habitus  LV Ejection Fraction:  55%.  LV Wall Motion:  NL LV Function; NL Wall Motion  Sanda Klein, MD, [facility] [phone] office [phone] pager

## 2014-06-14 ENCOUNTER — Ambulatory Visit (HOSPITAL_COMMUNITY): Payer: Medicare Other

## 2014-06-14 DIAGNOSIS — R0989 Other specified symptoms and signs involving the circulatory and respiratory systems: Secondary | ICD-10-CM

## 2014-06-14 MED ORDER — TECHNETIUM TC 99M SESTAMIBI GENERIC - CARDIOLITE
33.0000 | Freq: Once | INTRAVENOUS | Status: AC | PRN
  Administered 2014-06-14: 33 via INTRAVENOUS

## 2014-09-12 ENCOUNTER — Encounter: Payer: Self-pay | Admitting: Hematology

## 2014-09-12 ENCOUNTER — Telehealth: Payer: Self-pay | Admitting: Hematology

## 2014-09-12 ENCOUNTER — Other Ambulatory Visit (HOSPITAL_BASED_OUTPATIENT_CLINIC_OR_DEPARTMENT_OTHER): Payer: Medicare Other

## 2014-09-12 ENCOUNTER — Other Ambulatory Visit: Payer: Self-pay

## 2014-09-12 ENCOUNTER — Ambulatory Visit (HOSPITAL_BASED_OUTPATIENT_CLINIC_OR_DEPARTMENT_OTHER): Payer: Medicare Other | Admitting: Hematology

## 2014-09-12 VITALS — BP 136/67 | HR 97 | Temp 97.4°F | Resp 18 | Ht 72.0 in | Wt 297.0 lb

## 2014-09-12 DIAGNOSIS — R944 Abnormal results of kidney function studies: Secondary | ICD-10-CM

## 2014-09-12 DIAGNOSIS — E669 Obesity, unspecified: Secondary | ICD-10-CM

## 2014-09-12 DIAGNOSIS — C9110 Chronic lymphocytic leukemia of B-cell type not having achieved remission: Secondary | ICD-10-CM

## 2014-09-12 DIAGNOSIS — C8590 Non-Hodgkin lymphoma, unspecified, unspecified site: Secondary | ICD-10-CM

## 2014-09-12 DIAGNOSIS — N289 Disorder of kidney and ureter, unspecified: Secondary | ICD-10-CM

## 2014-09-12 DIAGNOSIS — D696 Thrombocytopenia, unspecified: Secondary | ICD-10-CM

## 2014-09-12 DIAGNOSIS — F200 Paranoid schizophrenia: Secondary | ICD-10-CM

## 2014-09-12 LAB — CBC WITH DIFFERENTIAL/PLATELET
BASO%: 0.6 % (ref 0.0–2.0)
Basophils Absolute: 0.1 10*3/uL (ref 0.0–0.1)
EOS%: 3.9 % (ref 0.0–7.0)
Eosinophils Absolute: 0.3 10*3/uL (ref 0.0–0.5)
HCT: 40.5 % (ref 38.4–49.9)
HGB: 13.2 g/dL (ref 13.0–17.1)
LYMPH%: 20.5 % (ref 14.0–49.0)
MCH: 30.2 pg (ref 27.2–33.4)
MCHC: 32.6 g/dL (ref 32.0–36.0)
MCV: 92.7 fL (ref 79.3–98.0)
MONO#: 0.5 10*3/uL (ref 0.1–0.9)
MONO%: 6.1 % (ref 0.0–14.0)
NEUT#: 5.7 10*3/uL (ref 1.5–6.5)
NEUT%: 68.9 % (ref 39.0–75.0)
PLATELETS: 141 10*3/uL (ref 140–400)
RBC: 4.37 10*6/uL (ref 4.20–5.82)
RDW: 13.7 % (ref 11.0–14.6)
WBC: 8.2 10*3/uL (ref 4.0–10.3)
lymph#: 1.7 10*3/uL (ref 0.9–3.3)

## 2014-09-12 LAB — COMPREHENSIVE METABOLIC PANEL (CC13)
ALK PHOS: 77 U/L (ref 40–150)
ALT: 17 U/L (ref 0–55)
ANION GAP: 7 meq/L (ref 3–11)
AST: 18 U/L (ref 5–34)
Albumin: 3.8 g/dL (ref 3.5–5.0)
BILIRUBIN TOTAL: 0.23 mg/dL (ref 0.20–1.20)
BUN: 20.9 mg/dL (ref 7.0–26.0)
CO2: 25 meq/L (ref 22–29)
CREATININE: 2 mg/dL — AB (ref 0.7–1.3)
Calcium: 10 mg/dL (ref 8.4–10.4)
Chloride: 112 mEq/L — ABNORMAL HIGH (ref 98–109)
Glucose: 112 mg/dl (ref 70–140)
Potassium: 4.6 mEq/L (ref 3.5–5.1)
SODIUM: 145 meq/L (ref 136–145)
TOTAL PROTEIN: 6.1 g/dL — AB (ref 6.4–8.3)

## 2014-09-12 LAB — LACTATE DEHYDROGENASE (CC13): LDH: 162 U/L (ref 125–245)

## 2014-09-12 NOTE — Telephone Encounter (Signed)
Gave avs & cal for April 2016. °

## 2014-09-12 NOTE — Progress Notes (Signed)
[facility] PROGRESS NOTE DATE OF VISIT: 09/12/2014  DIAGNOSIS: CLL (chronic lymphocytic leukemia) - Plan: CBC with Differential, Comprehensive metabolic panel (Cmet) - [facility], Lactate dehydrogenase (LDH) - [facility]  Chief Complaint  Patient presents with  . Follow-up   CURRENT THERAPY: Observation.  PREVIOUS THERAPY:   He completed a total of 7 cycles of multi-agent chemotherapy from late November 2009 through late March 2010.    INTERVAL HISTORY:  [name] 63 y.o. male with a history of CLL and Non Hodgkins lymphoma is here for follow-up.   He was last seen by Dr Juliann Mule on 03/13/2014. 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  In addition, he has a history of chronic lymphocytic leukemia diagnosed in December 2004. He has been under observation until he developed the the non-hodgkins lymphoma as noted above.     [name] is here today once again with his power of attorney Gildardo Pounds.  [name] has a very prominent psychiatric history with a diagnosis of paranoid schizophrenia.  He resides at the [facility] group home.  He denies any symptoms of night sweats, weight loss or fevers. He has not had any hospitalizations or emergency room visits. Recenetly there was an elevation noted in his serum creatinine and his lithium has been slowly weaned off. His serum creatinine today was 2.0 up from 1.8 on 03/13/2014. His white blood cell count remains normal at 8.2, hemoglobin 13.2 g, hematocrit 40.5, platelet count 141. The labs are tabulated below:          MEDICAL HISTORY: Past Medical History  Diagnosis Date  . Chronic kidney disease 09/20/2008  . Paranoid schizophrenia   . Hiatal hernia   . NHL (non-Hodgkin's lymphoma) dx'd 08/2008 rt groin    chemo comp 08/2008  . CLL (chronic lymphoblastic leukemia) 10/2003  . cll dx'd 10/2003    no rx  . Non Hodgkin's lymphoma 08/2008  . GERD  (gastroesophageal reflux disease)     INTERIM HISTORY: has CAP (community acquired pneumonia); Leukocytosis; Anemia; CKD (chronic kidney disease), stage III; Schizophrenia; CLL (chronic lymphocytic leukemia); Non Hodgkin's lymphoma; and Exertional dyspnea on his problem list.    ALLERGIES:  has No Known Allergies.  MEDICATIONS: has a current medication list which includes the following prescription(s): aspirin ec, benztropine, chlorpromazine, cholecalciferol, clozapine, docusate sodium, escitalopram, ketoconazole, levothyroxine, lithium carbonate, mineral oil, omega-3 fatty acids, and senna.  SURGICAL HISTORY: History reviewed. No pertinent past surgical history.  REVIEW OF SYSTEMS:   Constitutional: Denies fevers, chills or abnormal weight loss Eyes: Denies blurriness of vision Ears, nose, mouth, throat, and face: Denies mucositis or sore throat Respiratory: Denies cough, dyspnea or wheezes Cardiovascular: Denies palpitation, chest discomfort or lower extremity swelling Gastrointestinal:  Denies nausea, heartburn or change in bowel habits Skin: Denies abnormal skin rashes Lymphatics: Denies new lymphadenopathy or easy bruising Neurological:Denies numbness, tingling or new weaknesses Behavioral/Psych: Mood is stable, no new changes  All other systems were reviewed with the patient and are negative.  PHYSICAL EXAMINATION: ECOG PERFORMANCE STATUS: 0  Blood pressure 136/67, pulse 97, temperature 97.4 F (36.3 C), temperature source Oral, resp. rate 18, height 6' (1.829 m), weight 297 lb (134.718 kg).  GENERAL:alert, no distress and comfortable; Morbidly obese SKIN: skin color, texture, turgor are normal, no rashes or significant lesions EYES: normal, Conjunctiva are pink and non-injected, sclera clear OROPHARYNX:no exudate, no erythema and lips, buccal mucosa, and tongue normal  NECK: supple,  thyroid normal size, non-tender, without nodularity LYMPH:  no palpable lymphadenopathy in  the cervical, axillary or supraclavicular LUNGS: clear to auscultation and percussion with normal breathing effort HEART: Tachycardic and and no murmurs and no lower extremity edema ABDOMEN:abdomen soft, non-tender and normal bowel sounds Musculoskeletal:no cyanosis of digits and no clubbing  NEURO: alert & oriented x 3 with fluent speech, no focal motor/sensory deficits  Labs:  Lab Results  Component Value Date   WBC 8.2 09/12/2014   HGB 13.2 09/12/2014   HCT 40.5 09/12/2014   MCV 92.7 09/12/2014   PLT 141 09/12/2014   NEUTROABS 5.7 09/12/2014      Chemistry      Component Value Date/Time   NA 145 09/12/2014 1325   NA 137 12/26/2012 0402   NA 144 12/07/2011 0826   K 4.6 09/12/2014 1325   K 4.4 12/26/2012 0402   K 4.3 12/07/2011 0826   CL 105 12/26/2012 0402   CL 107 12/07/2011 0826   CO2 25 09/12/2014 1325   CO2 24 12/26/2012 0402   CO2 26 12/07/2011 0826   BUN 20.9 09/12/2014 1325   BUN 21 12/26/2012 0402   BUN 17 12/07/2011 0826   CREATININE 2.0* 09/12/2014 1325   CREATININE 1.58* 12/26/2012 0402   CREATININE 1.3* 12/07/2011 0826      Component Value Date/Time   CALCIUM 10.0 09/12/2014 1325   CALCIUM 8.9 12/26/2012 0402   CALCIUM 9.3 12/07/2011 0826   ALKPHOS 77 09/12/2014 1325   ALKPHOS 66 12/25/2012 0130   ALKPHOS 81 12/07/2011 0826   AST 18 09/12/2014 1325   AST 10 12/25/2012 0130   AST 21 12/07/2011 0826   ALT 17 09/12/2014 1325   ALT 9 12/25/2012 0130   ALT 26 12/07/2011 0826   BILITOT 0.23 09/12/2014 1325   BILITOT 0.2* 12/25/2012 0130   BILITOT 0.50 12/07/2011 0826      RADIOGRAPHIC STUDIES: CT CHEST, ABDOMEN AND PELVIS WITH CONTRAST 12/09/2011 Technique: Multidetector CT imaging of the chest, abdomen and pelvis was performed following the standard protocol during bolus  administration of intravenous contrast. Contrast: 157mL OMNIPAQUE IOHEXOL 300 MG/ML IV SOLN 100 ml Omnipaque-300. Comparison: CT scan 02/06/2011 and PET CT 12/10/2008.  CT CHEST Findings: The chest wall  is stable. Asymmetric subareolar breast tissue on the right has been present since the PET CT from 2010. No supraclavicular or axillary lymphadenopathy. No destructive bone lesions or spinal canal compromise. The heart is normal in size. No pericardial effusion. No mediastinal or hilar lymphadenopathy. The aorta is normal in caliber. No dissection. The branch vessels are unremarkable. The esophagus is grossly normal. Examination of the lung parenchyma demonstrates no pulmonary nodules or pleural disease. The tracheobronchial tree is unremarkable. IMPRESSION: Stable CT appearance of the chest. No findings for recurrent lymphoma.  CT ABDOMEN AND PELVIS Findings: The liver is unremarkable. No focal lesions. The spleen is mildly enlarged measuring 14 x 14 x 10 cm. This is stable. No focal lesions. The pancreas, adrenal glands and kidneys are unremarkable and stable. 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 A The stomach, duodenum, small bowel and colon are unremarkable. The appendix is normal. No mesenteric or retroperitoneal masses or lymphadenopathy. The aorta is normal in caliber. The major branch vessels are normal. The prostate gland is enlarged but stable. Stable diffuse bladder wall thickening likely due to partial bladder outlet obstruction. Stable small cystic structure in the left lower pelvis just superior to the left seminal vesicle. No pelvic mass or lymphadenopathy.  Small inguinal hernias containing fat are noted. No inguinal mass or adenopathy. The bony structures are unremarkable. Stable degenerative changes in the spine.  IMPRESSION: 1. Stable mild splenomegaly but no focal lesions. 2. No abdominal or pelvic lymphadenopathy.  ASSESSMENT: EDIL RUFUS 63 y.o. male with a history of CLL (chronic lymphocytic leukemia) - Plan: CBC with Differential, Comprehensive metabolic panel (Cmet) - [facility], Lactate dehydrogenase (LDH) - [facility]   PLAN:   1. Non Hodgkin's lymphoma involving the right  groin. --Clinically, he denies symptoms of recurrence.  His CT scan in January of 2013 revealed a stable CT appearance of the chest. No findings for recurrent lymphoma and stable mild splenomegaly but no focal lesions without abdominal or pelvic lymphadenopathy.  Continue to observe closely. Serum LDH was normal. He does have some renal insufficiency and they are adjusting his medications likely a medication related nephrotoxicity.  2. CLL --His WBCs is 8.2 today.  He denies recurrent infections or constitutional symptoms.  We will continue to observe. Platelets are normal at 141 up from 109. Hemoglobin is 13.2 grams.  3. Thrombocytopenia, mild --He denies bleeding. Platelets are better.  4. Paranoid schizophrenia. --Continue current medications. He is off Lithium due to renal failure.  5. Obesity.  --He was counseled on continuing weight lost through exercise and diet.   6. History of chronic kidney disease.  --Creatinine is elevated.  Avoid nephrotoxins.   7. Follow-up.  --He will follow-up in six months with CBC, CMP and LDH.   All questions were answered. The patient knows to call the clinic with any problems, questions or concerns. We can certainly see the patient much sooner if necessary.  I spent 15 minutes counseling the patient face to face. The total time spent in the appointment was 25 minutes.    Bernadene Bell, MD Medical Hematologist/Oncologist [place] Pager: [phone] Office No: [phone]

## 2014-11-17 ENCOUNTER — Emergency Department (HOSPITAL_COMMUNITY)
Admission: EM | Admit: 2014-11-17 | Discharge: 2014-11-17 | Disposition: A | Discharge status: Home / Self Care | Payer: Medicare Other | Attending: Emergency Medicine | Admitting: Emergency Medicine

## 2014-11-17 ENCOUNTER — Encounter (HOSPITAL_COMMUNITY): Payer: Self-pay | Admitting: Emergency Medicine

## 2014-11-17 ENCOUNTER — Emergency Department (HOSPITAL_COMMUNITY): Payer: Medicare Other

## 2014-11-17 DIAGNOSIS — R079 Chest pain, unspecified: Secondary | ICD-10-CM | POA: Diagnosis present

## 2014-11-17 DIAGNOSIS — Z7982 Long term (current) use of aspirin: Secondary | ICD-10-CM | POA: Diagnosis not present

## 2014-11-17 DIAGNOSIS — Z8572 Personal history of non-Hodgkin lymphomas: Secondary | ICD-10-CM | POA: Insufficient documentation

## 2014-11-17 DIAGNOSIS — Z79899 Other long term (current) drug therapy: Secondary | ICD-10-CM | POA: Diagnosis not present

## 2014-11-17 DIAGNOSIS — Z8719 Personal history of other diseases of the digestive system: Secondary | ICD-10-CM | POA: Insufficient documentation

## 2014-11-17 DIAGNOSIS — Z856 Personal history of leukemia: Secondary | ICD-10-CM | POA: Diagnosis not present

## 2014-11-17 DIAGNOSIS — N189 Chronic kidney disease, unspecified: Secondary | ICD-10-CM | POA: Diagnosis not present

## 2014-11-17 DIAGNOSIS — F200 Paranoid schizophrenia: Secondary | ICD-10-CM | POA: Diagnosis not present

## 2014-11-17 LAB — CBC WITH DIFFERENTIAL/PLATELET
BASOS ABS: 0 10*3/uL (ref 0.0–0.1)
Basophils Relative: 1 % (ref 0–1)
EOS PCT: 3 % (ref 0–5)
Eosinophils Absolute: 0.2 10*3/uL (ref 0.0–0.7)
HCT: 41.1 % (ref 39.0–52.0)
Hemoglobin: 13.5 g/dL (ref 13.0–17.0)
Lymphocytes Relative: 21 % (ref 12–46)
Lymphs Abs: 1.7 10*3/uL (ref 0.7–4.0)
MCH: 30.5 pg (ref 26.0–34.0)
MCHC: 32.8 g/dL (ref 30.0–36.0)
MCV: 93 fL (ref 78.0–100.0)
Monocytes Absolute: 0.3 10*3/uL (ref 0.1–1.0)
Monocytes Relative: 4 % (ref 3–12)
NEUTROS ABS: 5.6 10*3/uL (ref 1.7–7.7)
Neutrophils Relative %: 71 % (ref 43–77)
PLATELETS: 137 10*3/uL — AB (ref 150–400)
RBC: 4.42 MIL/uL (ref 4.22–5.81)
RDW: 14.2 % (ref 11.5–15.5)
WBC: 7.8 10*3/uL (ref 4.0–10.5)

## 2014-11-17 LAB — BASIC METABOLIC PANEL
ANION GAP: 7 (ref 5–15)
BUN: 19 mg/dL (ref 6–23)
CALCIUM: 9.6 mg/dL (ref 8.4–10.5)
CO2: 27 mmol/L (ref 19–32)
Chloride: 106 mEq/L (ref 96–112)
Creatinine, Ser: 1.71 mg/dL — ABNORMAL HIGH (ref 0.50–1.35)
GFR calc Af Amer: 48 mL/min — ABNORMAL LOW (ref >90)
GFR calc non Af Amer: 41 mL/min — ABNORMAL LOW (ref >90)
Glucose, Bld: 160 mg/dL — ABNORMAL HIGH (ref 70–99)
Potassium: 3.9 mmol/L (ref 3.5–5.1)
SODIUM: 140 mmol/L (ref 135–145)

## 2014-11-17 LAB — TROPONIN I: Troponin I: <0.03 ng/mL (ref <0.031)

## 2014-11-17 NOTE — ED Notes (Signed)
Patient transported to X-ray 

## 2014-11-17 NOTE — ED Provider Notes (Signed)
CSN: [id number]     Arrival date & time 11/17/14  [id number] History   First MD Initiated Contact with Patient 11/17/14 [phone]     Chief Complaint  Patient presents with  . Chest Pain     (Consider location/radiation/quality/duration/timing/severity/associated sxs/prior Treatment) HPI Comments: Patient with h/o chronic lymphocytic leukemia, non-Hodgkin's lymphoma, schizophrenia residing in group home -- presents with c/o chest pain. States that he had chest pain at 3AM today lasting a couple of minutes. Currently resolved. He denies shortness of breath. Patient otherwise is tough to keep on task. He talks about buying stamps at the post office and is upset that the group home will not let him go to the post office. Level V caveat due to underlying psychiatric illness.   Records show no history of PE. No reported risk factors for pulmonary embolism including: unilateral leg swelling, history of DVT/PE/other blood clots, use of testosterone, recent immobilizations, recent surgery, recent travel (>4hr segment), hemoptysis. Does have h/o hematologic malignancy.   Myoview 06/11/14: Overall Impression: Low risk stress nuclear study with possible mild inferior wall ischemia on a background of marked diaphragmatic attenuation artifact. Study specificity is limited by the patient's body habitus.  Patient is a 64 y.o. male presenting with chest pain. The history is provided by the patient and medical records.  Chest Pain Associated symptoms: no shortness of breath     Past Medical History  Diagnosis Date  . Chronic kidney disease 09/20/2008  . Paranoid schizophrenia   . Hiatal hernia   . NHL (non-Hodgkin's lymphoma) dx'd 08/2008 rt groin    chemo comp 08/2008  . CLL (chronic lymphoblastic leukemia) 10/2003  . cll dx'd 10/2003    no rx  . Non Hodgkin's lymphoma 08/2008  . GERD (gastroesophageal reflux disease)    No past surgical history on file. No family history on file. History  Substance Use  Topics  . Smoking status: Never Smoker   . Smokeless tobacco: Not on file  . Alcohol Use: No    Review of Systems  Unable to perform ROS: Other  Respiratory: Negative for shortness of breath.   Cardiovascular: Positive for chest pain.      Allergies  Review of patient's allergies indicates no known allergies.  Home Medications   Prior to Admission medications   Medication Sig Start Date End Date Taking? Authorizing Provider  aspirin EC 81 MG tablet Take 81 mg by mouth daily.    Historical Provider, MD  benztropine (COGENTIN) 0.5 MG tablet Take 0.5 mg by mouth daily before breakfast.     Historical Provider, MD  chlorproMAZINE (THORAZINE) 10 MG tablet Take 10 mg by mouth at bedtime.     Historical Provider, MD  cholecalciferol (VITAMIN D) 1000 UNITS tablet Take 1,000 Units by mouth daily.    Historical Provider, MD  clozapine (CLOZARIL) 200 MG tablet Take 200 mg by mouth 2 (two) times daily.    Historical Provider, MD  docusate sodium (COLACE) 100 MG capsule Take 100 mg by mouth daily.    Historical Provider, MD  escitalopram (LEXAPRO) 10 MG tablet Take 10 mg by mouth daily before breakfast.     Historical Provider, MD  ketoconazole (NIZORAL) 2 % cream Apply 1 application topically daily.    Historical Provider, MD  levothyroxine (SYNTHROID, LEVOTHROID) 100 MCG tablet Take 100 mcg by mouth daily before breakfast.     Historical Provider, MD  lithium carbonate 300 MG capsule Take 300 mg by mouth 2 (two) times daily. IS GETTING WEANED  OFF.    Historical Provider, MD  mineral oil liquid Take by mouth once a week. Instill 2 drops into both ears once weekly    Historical Provider, MD  Omega-3 Fatty Acids 1000 MG CPDR Take 4,000 mg by mouth daily.     Historical Provider, MD  senna (SENOKOT) 8.6 MG tablet Take 1 tablet by mouth daily.    Historical Provider, MD   BP 136/66 mmHg  Pulse 90  Temp(Src) 97.6 F (36.4 C) (Oral)  Resp 10  SpO2 99%   Physical Exam  Constitutional: He  appears well-developed and well-nourished.  HENT:  Head: Normocephalic and atraumatic.  Mouth/Throat: Mucous membranes are normal. Mucous membranes are not dry.  Eyes: Conjunctivae are normal.  Neck: Trachea normal and normal range of motion. Neck supple. Normal carotid pulses and no JVD present. No muscular tenderness present. Carotid bruit is not present. No tracheal deviation present.  Cardiovascular: Normal rate, regular rhythm, S1 normal, S2 normal, normal heart sounds and intact distal pulses.  Exam reveals no distant heart sounds and no decreased pulses.   No murmur heard. Pulmonary/Chest: Effort normal and breath sounds normal. No respiratory distress. He has no wheezes. He exhibits no tenderness.  Abdominal: Soft. Normal aorta and bowel sounds are normal. There is no tenderness. There is no rebound and no guarding.  Musculoskeletal: He exhibits no edema.  Neurological: He is alert.  Skin: Skin is warm and dry. He is not diaphoretic. No cyanosis. No pallor.  Psychiatric: He has a normal mood and affect.  Nursing note and vitals reviewed.   ED Course  Procedures (including critical care time) Labs Review Labs Reviewed  CBC WITH DIFFERENTIAL - Abnormal; Notable for the following:    Platelets 137 (*)    All other components within normal limits  BASIC METABOLIC PANEL - Abnormal; Notable for the following:    Glucose, Bld 160 (*)    Creatinine, Ser 1.71 (*)    GFR calc non Af Amer 41 (*)    GFR calc Af Amer 48 (*)    All other components within normal limits  TROPONIN I  TROPONIN I    Imaging Review Dg Chest 2 View  11/17/2014   CLINICAL DATA:  Mid and left-sided chest pain.  EXAM: CHEST - 2 VIEW  COMPARISON:  12/26/2012  FINDINGS: The heart size and mediastinal contours are within normal limits. Bibasilar atelectasis present. There is no evidence of pulmonary edema, consolidation, pneumothorax, nodule or pleural fluid. The visualized skeletal structures are unremarkable.   IMPRESSION: No active disease.   Electronically Signed   By: Aletta Edouard M.D.   On: 11/17/2014 10:56     EKG Interpretation   Date/Time:  Saturday November 17 2014 09:51:42 EST Ventricular Rate:  90 PR Interval:  176 QRS Duration: 159 QT Interval:  409 QTC Calculation: 500 R Axis:   2 Text Interpretation:  Sinus rhythm Right bundle branch block Confirmed by  Jeneen Rinks  MD, [place] ([id number]) on 11/17/2014 9:58:22 AM      9:55 AM Patient seen and examined. Work-up initiated. Medications ordered. Previous history reviewed.  Vital signs reviewed and are as follows: BP 136/66 mmHg  Pulse 90  Temp(Src) 97.6 F (36.4 C) (Oral)  Resp 10  SpO2 99%    2:58 PM Delta troponin negative. Patient has remained asymptomatic in ED. No chest pain. Will d/c to home.   Patient was counseled to return with severe chest pain, especially if the pain is crushing or pressure-like and spreads  to the arms, back, neck, or jaw, or if they have sweating, nausea, or shortness of breath with the pain. They were encouraged to call 911 with these symptoms.   They were also told to return if their chest pain gets worse and does not go away with rest, they have an attack of chest pain lasting longer than usual despite rest and treatment with the medications their caregiver has prescribed, if they wake from sleep with chest pain or shortness of breath, if they feel dizzy or faint, if they have chest pain not typical of their usual pain, or if they have any other emergent concerns regarding their health.  MDM   Final diagnoses:  Chest pain   Patient with atypical CP, resolved prior to arrival, no SOB, no clinical findings of DVT/PE. Normal vitals without tachypnea or tachycardia. Poor story for ACS with unchanged EKG and neg trop x 2. Feel patient safe for d/c to home with PCP f/u. As patient asymptomatic without SOB or CP, do not feel d-dimer or CTA is indicated at this time. Per EMS report, patient is at his usual  psychiatric baseline. Known CKD at baseline.     Carlisle Cater, PA-C 11/17/14 1517  Tanna Furry, MD 11/25/14 [phone]

## 2014-11-17 NOTE — Discharge Instructions (Signed)
Please read and follow all provided instructions.  Your diagnoses today include:  1. Chest pain    Tests performed today include:  An EKG of your heart - no change from previous  A chest x-ray - negative  Cardiac enzymes - a blood test for heart muscle damage showed no signs of heart problem  Blood counts and electrolytes  Vital signs. See below for your results today.   Medications prescribed:   None  Take any prescribed medications only as directed.  Follow-up instructions: Please follow-up with your primary care provider as soon as you can for further evaluation of your symptoms.   Return instructions:  SEEK IMMEDIATE MEDICAL ATTENTION IF:  You have severe chest pain, especially if the pain is crushing or pressure-like and spreads to the arms, back, neck, or jaw, or if you have sweating, nausea (feeling sick to your stomach), or shortness of breath. THIS IS AN EMERGENCY. Don't wait to see if the pain will go away. Get medical help at once. Call 911 or [phone] (operator). DO NOT drive yourself to the hospital.   Your chest pain gets worse and does not go away with rest.   You have an attack of chest pain lasting longer than usual, despite rest and treatment with the medications your caregiver has prescribed.   You wake from sleep with chest pain or shortness of breath.  You feel dizzy or faint.  You have chest pain not typical of your usual pain for which you originally saw your caregiver.   You have any other emergent concerns regarding your health.  Additional Information: Chest pain comes from many different causes. Your caregiver has diagnosed you as having chest pain that is not specific for one problem, but does not require admission.  You are at low risk for an acute heart condition or other serious illness.   Your vital signs today were: BP 119/79 mmHg   Pulse 79   Temp(Src) 97.6 F (36.4 C) (Oral)   Resp 20   SpO2 97% If your blood pressure (BP) was elevated  above 135/85 this visit, please have this repeated by your doctor within one month. --------------

## 2014-11-17 NOTE — ED Notes (Signed)
Notified/gave report to [facility] regarding pt transport back to [facility].

## 2014-11-17 NOTE — ED Notes (Signed)
Placed call to [facility] for transportation

## 2014-11-17 NOTE — ED Notes (Signed)
Received pt from [facility] with c/o, pt told staff this morning that he had chest pain last night. Pt denies chest pain today. Pt given 324 mg of ASA by EMS and EKG done that shows RBBB for EMS.

## 2014-11-17 NOTE — ED Notes (Signed)
Pt made aware to return if symptoms worsen or if any life threatening symptoms occur.   

## 2015-01-24 ENCOUNTER — Telehealth: Payer: Self-pay | Admitting: Hematology and Oncology

## 2015-01-24 NOTE — Telephone Encounter (Signed)
S/w caregiver at group home and he is aware of the new appt,a letter and calendar was mailed as well   [name]

## 2015-03-13 ENCOUNTER — Other Ambulatory Visit: Payer: Medicare Other

## 2015-03-13 ENCOUNTER — Ambulatory Visit: Payer: Medicare Other

## 2015-03-14 ENCOUNTER — Other Ambulatory Visit (HOSPITAL_BASED_OUTPATIENT_CLINIC_OR_DEPARTMENT_OTHER): Payer: Medicare Other

## 2015-03-14 ENCOUNTER — Encounter: Payer: Self-pay | Admitting: Hematology and Oncology

## 2015-03-14 ENCOUNTER — Ambulatory Visit (HOSPITAL_BASED_OUTPATIENT_CLINIC_OR_DEPARTMENT_OTHER): Payer: Medicare Other | Admitting: Hematology and Oncology

## 2015-03-14 VITALS — BP 123/67 | HR 105 | Temp 97.7°F | Resp 19 | Ht 72.0 in | Wt 286.4 lb

## 2015-03-14 DIAGNOSIS — C8590 Non-Hodgkin lymphoma, unspecified, unspecified site: Secondary | ICD-10-CM

## 2015-03-14 DIAGNOSIS — C9110 Chronic lymphocytic leukemia of B-cell type not having achieved remission: Secondary | ICD-10-CM

## 2015-03-14 DIAGNOSIS — N183 Chronic kidney disease, stage 3 unspecified: Secondary | ICD-10-CM

## 2015-03-14 LAB — COMPREHENSIVE METABOLIC PANEL (CC13)
ALBUMIN: 3.8 g/dL (ref 3.5–5.0)
ALK PHOS: 90 U/L (ref 40–150)
ALT: 20 U/L (ref 0–55)
AST: 16 U/L (ref 5–34)
Anion Gap: 14 mEq/L — ABNORMAL HIGH (ref 3–11)
BUN: 24.6 mg/dL (ref 7.0–26.0)
CO2: 20 mEq/L — ABNORMAL LOW (ref 22–29)
CREATININE: 1.9 mg/dL — AB (ref 0.7–1.3)
Calcium: 10 mg/dL (ref 8.4–10.4)
Chloride: 110 mEq/L — ABNORMAL HIGH (ref 98–109)
EGFR: 37 mL/min/{1.73_m2} — ABNORMAL LOW (ref >90)
Glucose: 123 mg/dl (ref 70–140)
Potassium: 4.4 mEq/L (ref 3.5–5.1)
Sodium: 145 mEq/L (ref 136–145)
Total Bilirubin: <0.2 mg/dL (ref 0.20–1.20)
Total Protein: 6.2 g/dL — ABNORMAL LOW (ref 6.4–8.3)

## 2015-03-14 LAB — CBC WITH DIFFERENTIAL/PLATELET
BASO%: 0.8 % (ref 0.0–2.0)
Basophils Absolute: 0.1 10*3/uL (ref 0.0–0.1)
EOS%: 2.8 % (ref 0.0–7.0)
Eosinophils Absolute: 0.2 10*3/uL (ref 0.0–0.5)
HCT: 42.1 % (ref 38.4–49.9)
HGB: 13.8 g/dL (ref 13.0–17.1)
LYMPH%: 19.5 % (ref 14.0–49.0)
MCH: 29.6 pg (ref 27.2–33.4)
MCHC: 32.7 g/dL (ref 32.0–36.0)
MCV: 90.5 fL (ref 79.3–98.0)
MONO#: 0.7 10*3/uL (ref 0.1–0.9)
MONO%: 8.2 % (ref 0.0–14.0)
NEUT%: 68.7 % (ref 39.0–75.0)
NEUTROS ABS: 6 10*3/uL (ref 1.5–6.5)
Platelets: 147 10*3/uL (ref 140–400)
RBC: 4.65 10*6/uL (ref 4.20–5.82)
RDW: 13.7 % (ref 11.0–14.6)
WBC: 8.8 10*3/uL (ref 4.0–10.3)
lymph#: 1.7 10*3/uL (ref 0.9–3.3)

## 2015-03-14 LAB — LACTATE DEHYDROGENASE (CC13): LDH: 148 U/L (ref 125–245)

## 2015-03-14 NOTE — Progress Notes (Signed)
[facility] FOLLOW-UP progress notes  No care team member to display  CHIEF COMPLAINTS/PURPOSE OF VISIT:  History of CLL and transformed diffuse B-cell lymphoma  HISTORY OF PRESENTING ILLNESS:  [name] 64 y.o. male was transferred to my care after his prior physician has left.  I reviewed the patient's records extensive and collaborated the history with the patient. Summary of his history is as follows: The patient have chronic mental illness and is unable to get an accurate history. I review his records extensively. 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 He received chemotherapy with a R-CHOP with complete remission. His last imaging study was in 2013 which showed no evidence of disease. He has been observed since then.  MEDICAL HISTORY:  Past Medical History  Diagnosis Date  . Chronic kidney disease 09/20/2008  . Paranoid schizophrenia   . Hiatal hernia   . NHL (non-Hodgkin's lymphoma) dx'd 08/2008 rt groin    chemo comp 08/2008  . CLL (chronic lymphoblastic leukemia) 10/2003  . cll dx'd 10/2003    no rx  . Non Hodgkin's lymphoma 08/2008  . GERD (gastroesophageal reflux disease)     SURGICAL HISTORY: History reviewed. No pertinent past surgical history.  SOCIAL HISTORY: History   Social History  . Marital Status: Single    Spouse Name: N/A  . Number of Children: N/A  . Years of Education: N/A   Occupational History  . Not on file.   Social History Main Topics  . Smoking status: Never Smoker   . Smokeless tobacco: Not on file  . Alcohol Use: No  . Drug Use: No  . Sexual Activity: Not on file   Other Topics Concern  . Not on file   Social History Narrative    FAMILY HISTORY: History reviewed. No pertinent family history.  ALLERGIES:  has No Known Allergies.  MEDICATIONS:  Current Outpatient Prescriptions  Medication Sig Dispense Refill  . aspirin EC 81 MG tablet Take 81 mg by  mouth daily.    . benztropine (COGENTIN) 0.5 MG tablet Take 0.5 mg by mouth daily before breakfast.     . chlorproMAZINE (THORAZINE) 10 MG tablet Take 10 mg by mouth at bedtime.     . chlorproMAZINE (THORAZINE) 50 MG tablet Take 50 mg by mouth 2 (two) times daily.    . cholecalciferol (VITAMIN D) 1000 UNITS tablet Take 1,000 Units by mouth daily.    . clozapine (CLOZARIL) 200 MG tablet Take 200 mg by mouth 2 (two) times daily.    Marland Kitchen docusate sodium (COLACE) 100 MG capsule Take 100 mg by mouth daily.    Marland Kitchen escitalopram (LEXAPRO) 10 MG tablet Take 10 mg by mouth daily before breakfast.     . ketoconazole (NIZORAL) 2 % cream Apply 1 application topically daily.    Marland Kitchen lamoTRIgine (LAMICTAL) 25 MG tablet Take 50 mg by mouth at bedtime.    Marland Kitchen levothyroxine (SYNTHROID, LEVOTHROID) 100 MCG tablet Take 100 mcg by mouth daily before breakfast.     . lithium carbonate 300 MG capsule Take 300 mg by mouth 2 (two) times daily. IS GETTING WEANED OFF.    Marland Kitchen mineral oil liquid Take by mouth once a week. Instill 2 drops into both ears once weekly    . Omega-3 Fatty Acids 1000 MG CPDR Take 4,000 mg by mouth daily.     Marland Kitchen senna (SENOKOT) 8.6 MG tablet Take 1 tablet by mouth daily.     No  current facility-administered medications for this visit.    REVIEW OF SYSTEMS:   Constitutional: Denies fevers, chills or abnormal night sweats Eyes: Denies blurriness of vision, double vision or watery eyes Ears, nose, mouth, throat, and face: Denies mucositis or sore throat Respiratory: Denies cough, dyspnea or wheezes Cardiovascular: Denies palpitation, chest discomfort or lower extremity swelling Gastrointestinal:  Denies nausea, heartburn or change in bowel habits Skin: Denies abnormal skin rashes Lymphatics: Denies new lymphadenopathy or easy bruising Neurological:Denies numbness, tingling or new weaknesses Behavioral/Psych: Mood is stable, no new changes  All other systems were reviewed with the patient and are  negative.  PHYSICAL EXAMINATION: ECOG PERFORMANCE STATUS: 0 - Asymptomatic  Filed Vitals:   03/14/15 1334  BP: 123/67  Pulse: 105  Temp: 97.7 F (36.5 C)  Resp: 19   Filed Weights   03/14/15 1334  Weight: 286 lb 6.4 oz (129.91 kg)    GENERAL:alert, no distress and comfortable. He is morbidly obese SKIN: skin color, texture, turgor are normal, no rashes or significant lesions EYES: normal, conjunctiva are pink and non-injected, sclera clear OROPHARYNX:no exudate, normal lips, buccal mucosa, and tongue  NECK: supple, thyroid normal size, non-tender, without nodularity LYMPH:  no palpable lymphadenopathy in the cervical, axillary or inguinal LUNGS: clear to auscultation and percussion with normal breathing effort HEART: regular rate & rhythm and no murmurs without lower extremity edema ABDOMEN:abdomen soft, non-tender and normal bowel sounds. Unable to appreciate hepatosplenomegaly due to morbid obesity Musculoskeletal:no cyanosis of digits and no clubbing  PSYCH: alert & oriented x 3 with fluent speech NEURO: no focal motor/sensory deficits  LABORATORY DATA:  I have reviewed the data as listed Lab Results  Component Value Date   WBC 8.8 03/14/2015   HGB 13.8 03/14/2015   HCT 42.1 03/14/2015   MCV 90.5 03/14/2015   PLT 147 03/14/2015    Recent Labs  09/12/14 1325 11/17/14 1020  NA 145 140  K 4.6 3.9  CL  --  106  CO2 25 27  GLUCOSE 112 160*  BUN 20.9 19  CREATININE 2.0* 1.71*  CALCIUM 10.0 9.6  GFRNONAA  --  41*  GFRAA  --  48*  PROT 6.1*  --   ALBUMIN 3.8  --   AST 18  --   ALT 17  --   ALKPHOS 77  --   BILITOT 0.23  --     ASSESSMENT & PLAN:   CLL (chronic lymphocytic leukemia) He has no evidence of persistent disease. I will discharge him from the clinic.   Non Hodgkin's lymphoma He has no evidence of disease and is consider long-term cancer survivor. I will discharge him from the clinic.   CKD (chronic kidney disease), stage III This is  stable on observation. I will defer to primary care provider for chronic monitoring.       All questions were answered. The patient knows to call the clinic with any problems, questions or concerns. I spent 15 minutes counseling the patient face to face. The total time spent in the appointment was 20 minutes and more than 50% was on counseling.     [facility], [place], MD 03/14/2015 1:53 PM

## 2015-03-14 NOTE — Assessment & Plan Note (Signed)
He has no evidence of disease and is consider long-term cancer survivor. I will discharge him from the clinic.

## 2015-03-14 NOTE — Assessment & Plan Note (Signed)
This is stable on observation. I will defer to primary care provider for chronic monitoring.

## 2015-03-14 NOTE — Assessment & Plan Note (Signed)
He has no evidence of persistent disease. I will discharge him from the clinic.

## 2015-03-27 ENCOUNTER — Other Ambulatory Visit (HOSPITAL_COMMUNITY): Payer: Self-pay

## 2015-03-27 ENCOUNTER — Encounter (HOSPITAL_COMMUNITY): Payer: Self-pay | Admitting: *Deleted

## 2015-03-27 ENCOUNTER — Inpatient Hospital Stay (HOSPITAL_COMMUNITY)
Admission: EM | Admit: 2015-03-27 | Discharge: 2015-03-31 | DRG: 871 | Disposition: A | Discharge status: Nursing Home (Includes ICF) | Payer: Medicare Other | Attending: Internal Medicine | Admitting: Internal Medicine

## 2015-03-27 ENCOUNTER — Emergency Department (HOSPITAL_COMMUNITY): Payer: Medicare Other

## 2015-03-27 ENCOUNTER — Other Ambulatory Visit: Payer: Self-pay

## 2015-03-27 DIAGNOSIS — Z7982 Long term (current) use of aspirin: Secondary | ICD-10-CM

## 2015-03-27 DIAGNOSIS — N183 Chronic kidney disease, stage 3 unspecified: Secondary | ICD-10-CM | POA: Diagnosis present

## 2015-03-27 DIAGNOSIS — R9431 Abnormal electrocardiogram [ECG] [EKG]: Secondary | ICD-10-CM | POA: Insufficient documentation

## 2015-03-27 DIAGNOSIS — F209 Schizophrenia, unspecified: Secondary | ICD-10-CM | POA: Diagnosis present

## 2015-03-27 DIAGNOSIS — N390 Urinary tract infection, site not specified: Secondary | ICD-10-CM | POA: Diagnosis present

## 2015-03-27 DIAGNOSIS — Z79899 Other long term (current) drug therapy: Secondary | ICD-10-CM | POA: Diagnosis not present

## 2015-03-27 DIAGNOSIS — C8590 Non-Hodgkin lymphoma, unspecified, unspecified site: Secondary | ICD-10-CM | POA: Diagnosis not present

## 2015-03-27 DIAGNOSIS — R251 Tremor, unspecified: Secondary | ICD-10-CM | POA: Diagnosis present

## 2015-03-27 DIAGNOSIS — L03115 Cellulitis of right lower limb: Secondary | ICD-10-CM | POA: Diagnosis present

## 2015-03-27 DIAGNOSIS — A419 Sepsis, unspecified organism: Secondary | ICD-10-CM | POA: Diagnosis present

## 2015-03-27 DIAGNOSIS — I4581 Long QT syndrome: Secondary | ICD-10-CM | POA: Diagnosis present

## 2015-03-27 DIAGNOSIS — E872 Acidosis: Secondary | ICD-10-CM | POA: Diagnosis present

## 2015-03-27 DIAGNOSIS — J189 Pneumonia, unspecified organism: Secondary | ICD-10-CM

## 2015-03-27 DIAGNOSIS — C9110 Chronic lymphocytic leukemia of B-cell type not having achieved remission: Secondary | ICD-10-CM | POA: Diagnosis present

## 2015-03-27 DIAGNOSIS — R509 Fever, unspecified: Secondary | ICD-10-CM | POA: Diagnosis present

## 2015-03-27 DIAGNOSIS — F203 Undifferentiated schizophrenia: Secondary | ICD-10-CM | POA: Diagnosis not present

## 2015-03-27 DIAGNOSIS — Z856 Personal history of leukemia: Secondary | ICD-10-CM | POA: Diagnosis present

## 2015-03-27 DIAGNOSIS — Z8572 Personal history of non-Hodgkin lymphomas: Secondary | ICD-10-CM | POA: Diagnosis not present

## 2015-03-27 LAB — I-STAT CHEM 8, ED
BUN: 28 mg/dL — ABNORMAL HIGH (ref 6–20)
CREATININE: 1.9 mg/dL — AB (ref 0.61–1.24)
Calcium, Ion: 1.26 mmol/L (ref 1.13–1.30)
Chloride: 105 mmol/L (ref 101–111)
Glucose, Bld: 137 mg/dL — ABNORMAL HIGH (ref 70–99)
HCT: 46 % (ref 39.0–52.0)
HEMOGLOBIN: 15.6 g/dL (ref 13.0–17.0)
POTASSIUM: 4.4 mmol/L (ref 3.5–5.1)
Sodium: 141 mmol/L (ref 135–145)
TCO2: 21 mmol/L (ref 0–100)

## 2015-03-27 LAB — CBC WITH DIFFERENTIAL/PLATELET
BASOS PCT: 0 % (ref 0–1)
Basophils Absolute: 0 10*3/uL (ref 0.0–0.1)
Eosinophils Absolute: 0 10*3/uL (ref 0.0–0.7)
Eosinophils Relative: 0 % (ref 0–5)
HCT: 44.6 % (ref 39.0–52.0)
Hemoglobin: 14.4 g/dL (ref 13.0–17.0)
LYMPHS ABS: 0.8 10*3/uL (ref 0.7–4.0)
Lymphocytes Relative: 5 % — ABNORMAL LOW (ref 12–46)
MCH: 30.1 pg (ref 26.0–34.0)
MCHC: 32.3 g/dL (ref 30.0–36.0)
MCV: 93.1 fL (ref 78.0–100.0)
Monocytes Absolute: 0.8 10*3/uL (ref 0.1–1.0)
Monocytes Relative: 5 % (ref 3–12)
NEUTROS ABS: 14.1 10*3/uL — AB (ref 1.7–7.7)
NEUTROS PCT: 90 % — AB (ref 43–77)
Platelets: 107 10*3/uL — ABNORMAL LOW (ref 150–400)
RBC: 4.79 MIL/uL (ref 4.22–5.81)
RDW: 13.5 % (ref 11.5–15.5)
WBC: 15.7 10*3/uL — ABNORMAL HIGH (ref 4.0–10.5)

## 2015-03-27 LAB — LACTIC ACID, PLASMA: Lactic Acid, Venous: 3.5 mmol/L (ref 0.5–2.0)

## 2015-03-27 LAB — CBC
HEMATOCRIT: 39.5 % (ref 39.0–52.0)
HEMOGLOBIN: 12.9 g/dL — AB (ref 13.0–17.0)
MCH: 30.6 pg (ref 26.0–34.0)
MCHC: 32.7 g/dL (ref 30.0–36.0)
MCV: 93.6 fL (ref 78.0–100.0)
Platelets: 126 10*3/uL — ABNORMAL LOW (ref 150–400)
RBC: 4.22 MIL/uL (ref 4.22–5.81)
RDW: 13.7 % (ref 11.5–15.5)
WBC: 22.7 10*3/uL — ABNORMAL HIGH (ref 4.0–10.5)

## 2015-03-27 LAB — I-STAT CG4 LACTIC ACID, ED
Lactic Acid, Venous: 3.44 mmol/L (ref 0.5–2.0)
Lactic Acid, Venous: 3.79 mmol/L (ref 0.5–2.0)

## 2015-03-27 LAB — COMPREHENSIVE METABOLIC PANEL
ALBUMIN: 4.4 g/dL (ref 3.5–5.0)
ALK PHOS: 76 U/L (ref 38–126)
ALT: 17 U/L (ref 17–63)
AST: 20 U/L (ref 15–41)
Anion gap: 8 (ref 5–15)
BILIRUBIN TOTAL: 0.7 mg/dL (ref 0.3–1.2)
BUN: 28 mg/dL — AB (ref 6–20)
CO2: 26 mmol/L (ref 22–32)
Calcium: 9.7 mg/dL (ref 8.9–10.3)
Chloride: 106 mmol/L (ref 101–111)
Creatinine, Ser: 2.05 mg/dL — ABNORMAL HIGH (ref 0.61–1.24)
GFR calc Af Amer: 39 mL/min — ABNORMAL LOW (ref >60)
GFR, EST NON AFRICAN AMERICAN: 33 mL/min — AB (ref >60)
Glucose, Bld: 138 mg/dL — ABNORMAL HIGH (ref 70–99)
POTASSIUM: 4.6 mmol/L (ref 3.5–5.1)
SODIUM: 140 mmol/L (ref 135–145)
TOTAL PROTEIN: 6.9 g/dL (ref 6.5–8.1)

## 2015-03-27 LAB — URINALYSIS, ROUTINE W REFLEX MICROSCOPIC
Bilirubin Urine: NEGATIVE
Glucose, UA: NEGATIVE mg/dL
Hgb urine dipstick: NEGATIVE
Ketones, ur: NEGATIVE mg/dL
LEUKOCYTES UA: NEGATIVE
Nitrite: NEGATIVE
Protein, ur: NEGATIVE mg/dL
SPECIFIC GRAVITY, URINE: 1.014 (ref 1.005–1.030)
UROBILINOGEN UA: 0.2 mg/dL (ref 0.0–1.0)
pH: 5.5 (ref 5.0–8.0)

## 2015-03-27 LAB — CREATININE, SERUM
CREATININE: 2.15 mg/dL — AB (ref 0.61–1.24)
GFR, EST AFRICAN AMERICAN: 36 mL/min — AB (ref >60)
GFR, EST NON AFRICAN AMERICAN: 31 mL/min — AB (ref >60)

## 2015-03-27 LAB — LIPASE, BLOOD: LIPASE: 25 U/L (ref 22–51)

## 2015-03-27 LAB — PROCALCITONIN: PROCALCITONIN: 5.98 ng/mL

## 2015-03-27 MED ORDER — LEVOTHYROXINE SODIUM 100 MCG PO TABS
100.0000 ug | ORAL_TABLET | Freq: Every day | ORAL | Status: DC
  Administered 2015-03-28 – 2015-03-31 (×4): 100 ug via ORAL
  Filled 2015-03-27 (×4): qty 1

## 2015-03-27 MED ORDER — VANCOMYCIN HCL 10 G IV SOLR
1500.0000 mg | INTRAVENOUS | Status: DC
  Administered 2015-03-28 – 2015-03-29 (×2): 1500 mg via INTRAVENOUS
  Filled 2015-03-27 (×2): qty 1500

## 2015-03-27 MED ORDER — SODIUM CHLORIDE 0.9 % IV SOLN
INTRAVENOUS | Status: DC
  Administered 2015-03-27 – 2015-03-28 (×2): via INTRAVENOUS

## 2015-03-27 MED ORDER — DEXTROSE 5 % IV SOLN
1.0000 g | Freq: Once | INTRAVENOUS | Status: AC
  Administered 2015-03-27: 1 g via INTRAVENOUS
  Filled 2015-03-27: qty 10

## 2015-03-27 MED ORDER — VANCOMYCIN HCL 10 G IV SOLR
2500.0000 mg | Freq: Once | INTRAVENOUS | Status: AC
  Administered 2015-03-27: 2500 mg via INTRAVENOUS
  Filled 2015-03-27 (×2): qty 2500

## 2015-03-27 MED ORDER — BENZTROPINE MESYLATE 0.5 MG PO TABS
0.5000 mg | ORAL_TABLET | Freq: Every day | ORAL | Status: DC
  Administered 2015-03-28 – 2015-03-31 (×4): 0.5 mg via ORAL
  Filled 2015-03-27 (×4): qty 1

## 2015-03-27 MED ORDER — ASPIRIN EC 81 MG PO TBEC
81.0000 mg | DELAYED_RELEASE_TABLET | Freq: Every day | ORAL | Status: DC
  Administered 2015-03-28 – 2015-03-31 (×4): 81 mg via ORAL
  Filled 2015-03-27 (×4): qty 1

## 2015-03-27 MED ORDER — DEXTROSE 5 % IV SOLN
500.0000 mg | Freq: Once | INTRAVENOUS | Status: AC
Start: 2015-03-27 — End: 2015-03-27
  Administered 2015-03-27: 500 mg via INTRAVENOUS
  Filled 2015-03-27: qty 500

## 2015-03-27 MED ORDER — LAMOTRIGINE 25 MG PO TABS
50.0000 mg | ORAL_TABLET | Freq: Two times a day (BID) | ORAL | Status: DC
  Administered 2015-03-27 – 2015-03-31 (×8): 50 mg via ORAL
  Filled 2015-03-27: qty 0.5
  Filled 2015-03-27 (×8): qty 2

## 2015-03-27 MED ORDER — ESCITALOPRAM OXALATE 10 MG PO TABS
10.0000 mg | ORAL_TABLET | Freq: Every day | ORAL | Status: DC
  Administered 2015-03-28 – 2015-03-31 (×4): 10 mg via ORAL
  Filled 2015-03-27 (×4): qty 1

## 2015-03-27 MED ORDER — DOCUSATE SODIUM 100 MG PO CAPS
100.0000 mg | ORAL_CAPSULE | Freq: Every day | ORAL | Status: DC
Start: 2015-03-28 — End: 2015-03-31
  Administered 2015-03-28 – 2015-03-31 (×3): 100 mg via ORAL
  Filled 2015-03-27 (×3): qty 1

## 2015-03-27 MED ORDER — CEFTRIAXONE SODIUM IN DEXTROSE 20 MG/ML IV SOLN: 1.0000 g | INTRAVENOUS | Status: DC

## 2015-03-27 MED ORDER — ACETAMINOPHEN 500 MG PO TABS
1000.0000 mg | ORAL_TABLET | Freq: Once | ORAL | Status: AC
  Administered 2015-03-27: 1000 mg via ORAL
  Filled 2015-03-27: qty 2

## 2015-03-27 MED ORDER — CHLORPROMAZINE HCL 50 MG PO TABS
50.0000 mg | ORAL_TABLET | Freq: Two times a day (BID) | ORAL | Status: DC
  Administered 2015-03-27 – 2015-03-31 (×8): 50 mg via ORAL
  Filled 2015-03-27 (×10): qty 1

## 2015-03-27 MED ORDER — DEXTROSE 5 % IV SOLN: 500.0000 mg | INTRAVENOUS | Status: DC

## 2015-03-27 MED ORDER — SODIUM CHLORIDE 0.9 % IV BOLUS (SEPSIS)
2000.0000 mL | Freq: Once | INTRAVENOUS | Status: AC
  Administered 2015-03-27: 2000 mL via INTRAVENOUS

## 2015-03-27 MED ORDER — POLYETHYLENE GLYCOL 3350 17 G PO PACK
17.0000 g | PACK | Freq: Every day | ORAL | Status: DC
  Administered 2015-03-28 – 2015-03-31 (×3): 17 g via ORAL
  Filled 2015-03-27 (×4): qty 1

## 2015-03-27 MED ORDER — SENNA 8.6 MG PO TABS
1.0000 | ORAL_TABLET | Freq: Every day | ORAL | Status: DC
  Administered 2015-03-28 – 2015-03-31 (×3): 8.6 mg via ORAL
  Filled 2015-03-27 (×7): qty 1

## 2015-03-27 MED ORDER — PIPERACILLIN-TAZOBACTAM 3.375 G IVPB
3.3750 g | Freq: Three times a day (TID) | INTRAVENOUS | Status: DC
  Administered 2015-03-28 – 2015-03-29 (×5): 3.375 g via INTRAVENOUS
  Filled 2015-03-27 (×5): qty 50

## 2015-03-27 MED ORDER — ENOXAPARIN SODIUM 40 MG/0.4ML ~~LOC~~ SOLN
40.0000 mg | SUBCUTANEOUS | Status: DC
  Administered 2015-03-27 – 2015-03-30 (×4): 40 mg via SUBCUTANEOUS
  Filled 2015-03-27 (×4): qty 0.4

## 2015-03-27 MED ORDER — EZETIMIBE 10 MG PO TABS
10.0000 mg | ORAL_TABLET | Freq: Every day | ORAL | Status: DC
  Administered 2015-03-28 – 2015-03-31 (×4): 10 mg via ORAL
  Filled 2015-03-27 (×4): qty 1

## 2015-03-27 MED ORDER — CLOZAPINE 100 MG PO TABS
200.0000 mg | ORAL_TABLET | Freq: Two times a day (BID) | ORAL | Status: DC
  Administered 2015-03-27 – 2015-03-31 (×8): 200 mg via ORAL
  Filled 2015-03-27 (×10): qty 2

## 2015-03-27 NOTE — ED Notes (Signed)
Dr. Darl Householder notified of critical [place].

## 2015-03-27 NOTE — ED Notes (Signed)
Pt is from a group home and staff noted that he was having some tremors beginning at noon.  Pt is having some tremors, he is pale (but staff didn't note anything about that).  Pt with psych hx including schizophrenia.  Unknown if he is on new meds. Pt was alert and oriented for ems and noted that he "feels weak"

## 2015-03-27 NOTE — Progress Notes (Signed)
ANTIBIOTIC CONSULT NOTE - INITIAL  Pharmacy Consult for Vancomycin/Zosyn Indication: rule out sepsis  No Known Allergies  Patient Measurements: Height: 6\' 1"  (185.4 cm) Weight: 286 lb 1.6 oz (129.774 kg) IBW/kg (Calculated) : 79.9  Vital Signs: Temp: 98.1 F (36.7 C) (05/11 2117) Temp Source: Oral (05/11 2117) BP: 112/54 mmHg (05/11 2117) Pulse Rate: 118 (05/11 1600) Intake/Output from previous day:   Intake/Output from this shift: Total I/O In: 50 [I.V.:50] Out: -   Labs:  Recent Labs  03/27/15 1620 03/27/15 1649  WBC 15.7*  --   HGB 14.4 15.6  PLT 107*  --   CREATININE 2.05* 1.90*   Estimated Creatinine Clearance: 57.7 mL/min (by C-G formula based on Cr of 1.9). No results for input(s): VANCOTROUGH, VANCOPEAK, VANCORANDOM, GENTTROUGH, GENTPEAK, GENTRANDOM, TOBRATROUGH, TOBRAPEAK, TOBRARND, AMIKACINPEAK, AMIKACINTROU, AMIKACIN in the last 72 hours.   Microbiology: No results found for this or any previous visit (from the past 720 hour(s)).  Medical History: Past Medical History  Diagnosis Date  . Chronic kidney disease 09/20/2008  . Paranoid schizophrenia   . Hiatal hernia   . NHL (non-Hodgkin's lymphoma) dx'd 08/2008 rt groin    chemo comp 08/2008  . CLL (chronic lymphoblastic leukemia) 10/2003  . cll dx'd 10/2003    no rx  . Non Hodgkin's lymphoma 08/2008  . GERD (gastroesophageal reflux disease)      Assessment: 55 yoM admitted from group home with sepsis 2/2 CAP. Chest x-ray show evidence of mild left base opacities could not rule out pneumonia.  Azithromycin and Ceftriaxone given x 1 in ED.  Pharmacy consulted to dose vancomycin and Zosyn on admission.  Pt has CKD.  CrCl~41 ml/min (normalized).  Goal of Therapy:  Vancomycin trough level 15-20 mcg/ml  Doses adjusted per renal function Eradication of infection  Plan:  1.  Vancomycin 2500 mg IV loading dose then 1500 mg IV q24h. 2.  Zosyn 3.375g IV q8h (4 hour infusion time).   Hershal Coria 03/27/2015,10:23 PM

## 2015-03-27 NOTE — Progress Notes (Signed)
CRITICAL VALUE ALERT  Critical value received:  Lactic Acid 3.5  Date of notification:  03/27/2015  Time of notification:  2129  Critical value read back:Yes.    Nurse who received alert:  [email or web address]  MD notified (1st page):  A.Doutova  Time of first page:  2122  MD notified (2nd page):  Time of second page:  Responding MD:  A.Doutova  Time MD responded:  2122

## 2015-03-27 NOTE — Progress Notes (Signed)
CLOZAPINE MONITORING (reflects NEW REMS GUIDELINES EFFECTIVE 08/27/2014):  Check ANC at least weekly while inpatient.  For general population patients, i.e., those without benign ethnic neutropenia (BEN): --If ANC 1000-1499, increase ANC monitoring to 3x/wk --If ANC < 1000, HOLD CLOZAPINE and get psych consult   For patients with BEN: --If ANC 500-999, increase ANC monitoring to 3x/wk --If ANC < 500, HOLD CLOZAPINE and get psych consult  REMS-certified psychiatry provider can continue drug with ANC below cited thresholds if they document medical opinion that the neutropenia is not clozapine-induced (heme consult is recommended) or that risk of interrupting therapy is greater than the risk of developing severe neutropenia.  --NOTIFY CLINICAL COORDINATOR OF ALL ORDERS FOR CLOZAPINE --SEE PRESCRIBING INFORMATION FOR ADDITIONAL DETAILS  Labs within range. Per clozapine REMS site, ok to dispense as long as safe conditions are met.  WBC: 18, ANC: 14130   , Pharmd 03/27/15, 22:13   

## 2015-03-27 NOTE — ED Notes (Signed)
Patient transported to X-ray 

## 2015-03-27 NOTE — ED Notes (Signed)
Bed: [id number] Expected date:  Expected time:  Means of arrival:  Comments: Elderly tremors

## 2015-03-27 NOTE — Progress Notes (Signed)
Utilization Review completed.  Raylynn Hersh RN CM  

## 2015-03-27 NOTE — ED Provider Notes (Signed)
CSN: [id number]     Arrival date & time 03/27/15  1524 History   First MD Initiated Contact with Patient 03/27/15 1603     Chief Complaint  Patient presents with  . Fever     (Consider location/radiation/quality/duration/timing/severity/associated sxs/prior Treatment) The history is provided by the EMS personnel.  [name] is a 64 y.o. male history of schizophrenia, CLL, non-Hodgkin lymphoma here presenting with fever, tremors. He currently lives in a group home and after lunch he started having uncontrollable tremors. The staff thought it was secondary to schizophrenia. However patient denies any worsening hallucinations. He doesn't take his own medicine and is given medication by staff there. Has some nausea but no abdominal pain. Has nonproductive cough. No vomiting or diarrhea.    Level V caveat- condition of patient   Past Medical History  Diagnosis Date  . Chronic kidney disease 09/20/2008  . Paranoid schizophrenia   . Hiatal hernia   . NHL (non-Hodgkin's lymphoma) dx'd 08/2008 rt groin    chemo comp 08/2008  . CLL (chronic lymphoblastic leukemia) 10/2003  . cll dx'd 10/2003    no rx  . Non Hodgkin's lymphoma 08/2008  . GERD (gastroesophageal reflux disease)    History reviewed. No pertinent past surgical history. No family history on file. History  Substance Use Topics  . Smoking status: Never Smoker   . Smokeless tobacco: Not on file  . Alcohol Use: No    Review of Systems  Constitutional: Positive for fever.  Neurological: Positive for tremors.  All other systems reviewed and are negative.     Allergies  Review of patient's allergies indicates no known allergies.  Home Medications   Prior to Admission medications   Medication Sig Start Date End Date Taking? Authorizing Provider  aspirin EC 81 MG tablet Take 81 mg by mouth daily.   Yes Historical Provider, MD  benztropine (COGENTIN) 0.5 MG tablet Take 0.5 mg by mouth daily.    Yes Historical  Provider, MD  chlorproMAZINE (THORAZINE) 50 MG tablet Take 50 mg by mouth 2 (two) times daily.   Yes Historical Provider, MD  cholecalciferol (VITAMIN D) 1000 UNITS tablet Take 1,000 Units by mouth daily.   Yes Historical Provider, MD  clozapine (CLOZARIL) 200 MG tablet Take 200 mg by mouth 2 (two) times daily.   Yes Historical Provider, MD  clozapine (CLOZARIL) 200 MG tablet Take 200 mg by mouth 2 (two) times daily.   Yes Historical Provider, MD  docusate sodium (COLACE) 100 MG capsule Take 100 mg by mouth daily.   Yes Historical Provider, MD  escitalopram (LEXAPRO) 10 MG tablet Take 10 mg by mouth daily before breakfast.    Yes Historical Provider, MD  ezetimibe (ZETIA) 10 MG tablet Take 10 mg by mouth daily.   Yes Historical Provider, MD  ketoconazole (NIZORAL) 2 % cream Apply 1 application topically 2 (two) times daily.    Yes Historical Provider, MD  lamoTRIgine (LAMICTAL) 25 MG tablet Take 50 mg by mouth 2 (two) times daily.    Yes Historical Provider, MD  levothyroxine (SYNTHROID, LEVOTHROID) 100 MCG tablet Take 100 mcg by mouth daily before breakfast.    Yes Historical Provider, MD  mineral oil liquid Place into the right ear once a week. Instill 2 drops into both ears once weekly   Yes Historical Provider, MD  Omega-3 Fatty Acids 1000 MG CPDR Take 4,000 mg by mouth daily.    Yes Historical Provider, MD  polyethylene glycol (MIRALAX / GLYCOLAX) packet Take  17 g by mouth daily.   Yes Historical Provider, MD  senna (SENOKOT) 8.6 MG tablet Take 1 tablet by mouth daily.   Yes Historical Provider, MD   BP 132/51 mmHg  Pulse 118  Temp(Src) 102.6 F (39.2 C) (Rectal)  Resp 20  Ht 6\' 1"  (1.854 m)  Wt 285 lb (129.275 kg)  BMI 37.61 kg/m2  SpO2 93% Physical Exam  Constitutional:  Rigors, ill appearing   HENT:  Head: Normocephalic.  MM dry   Eyes: Pupils are equal, round, and reactive to light.  Neck: Normal range of motion. Neck supple.  Cardiovascular: Regular rhythm and normal heart  sounds.   Tachy   Pulmonary/Chest: Effort normal and breath sounds normal. No respiratory distress. He has no wheezes. He has no rales.  Abdominal: Soft. Bowel sounds are normal. He exhibits no distension. There is no tenderness. There is no rebound.  Distended, nontender   Musculoskeletal: Normal range of motion. He exhibits no edema or tenderness.  Neurological: He is alert.  Slightly confused. Nl strength throughout. CN 2-12 intact   Skin: Skin is warm and dry.  Psychiatric:  Unable   Nursing note and vitals reviewed.   ED Course  Procedures (including critical care time) Labs Review Labs Reviewed  CBC WITH DIFFERENTIAL/PLATELET - Abnormal; Notable for the following:    WBC 15.7 (*)    Platelets 107 (*)    Neutrophils Relative % 90 (*)    Neutro Abs 14.1 (*)    Lymphocytes Relative 5 (*)    All other components within normal limits  COMPREHENSIVE METABOLIC PANEL - Abnormal; Notable for the following:    Glucose, Bld 138 (*)    BUN 28 (*)    Creatinine, Ser 2.05 (*)    GFR calc non Af Amer 33 (*)    GFR calc Af Amer 39 (*)    All other components within normal limits  I-STAT CG4 LACTIC ACID, ED - Abnormal; Notable for the following:    Lactic Acid, Venous 3.44 (*)    All other components within normal limits  I-STAT CHEM 8, ED - Abnormal; Notable for the following:    BUN 28 (*)    Creatinine, Ser 1.90 (*)    Glucose, Bld 137 (*)    All other components within normal limits  URINE CULTURE  URINALYSIS, ROUTINE W REFLEX MICROSCOPIC  LIPASE, BLOOD  INFLUENZA PANEL BY PCR (TYPE A & B, H1N1)  I-STAT CG4 LACTIC ACID, ED    Imaging Review Dg Chest 2 View  03/27/2015   CLINICAL DATA:  Tremors, onset at noon today. Weakness, fever and chills.  EXAM: CHEST  2 VIEW  COMPARISON:  11/17/2014  FINDINGS: There is mild linear atelectasis or infiltrate in the left lung base as well as a probable small left effusion. The right lung is clear. Hilar, mediastinal and cardiac contours  are unremarkable and unchanged.  IMPRESSION: Mild left base linear opacities and probable small effusion, possibly due to pneumonia. Followup PA and lateral chest X-ray is recommended in 3-4 weeks to ensure resolution and exclude underlying malignancy.   Electronically Signed   By: Andreas Newport M.D.   On: 03/27/2015 17:44   Dg Abd 1 View  03/27/2015   CLINICAL DATA:  Abdominal distention and fever. Weakness. Chills. Non-Hodgkin's lymphoma  EXAM: ABDOMEN - 1 VIEW  COMPARISON:  None.  FINDINGS: No evidence of dilated bowel loops. A large stool burden is seen throughout the majority of the colon. No radiopaque calculi or  signs of abnormal mass effect identified.  IMPRESSION: No acute findings.  Large stool burden noted; suggest clinical correlation for possible constipation.   Electronically Signed   By: Earle Gell M.D.   On: 03/27/2015 17:47     EKG Interpretation None      MDM   Final diagnoses:  Fever and chills    [name] is a 64 y.o. male here with fever, rigors. Concerned for sepsis. Level 2 sepsis activated. Will get labs, cultures, CXR. Consider flu as well. Will give IVF and abx.   6:45 PM CXR showed pneumonia. WBC 15. Lactate 3.4. Flu panel sent. Will admit.   Wandra Arthurs, MD 03/27/15 [phone]

## 2015-03-27 NOTE — Hospital Discharge Follow-Up (Addendum)
PCP: none Oncology [facility] Anon  Referring provider Darl Householder   Chief Complaint:  rigors HPI: [name] is a 64 y.o. male   has a past medical history of Chronic kidney disease (09/20/2008); Paranoid schizophrenia; Hiatal hernia; NHL (non-Hodgkin's lymphoma) (dx'd 08/2008 rt groin); CLL (chronic lymphoblastic leukemia) (10/2003); cll (dx'd 10/2003); Non Hodgkin's lymphoma (08/2008); and GERD (gastroesophageal reflux disease).   Of note patient is a poor historian history is obtained through the emergency department records and review of prior records. Patient is unable to provide much of any insight. Patient has long-standing history of schizophrenia. Baseline mental status is unclear. Per notes has had history of being unable to provide history.  He currently resides in a group home. Patient was brought in due to tremors. After father investigation patient was found to be febrile up to 102.6. Elevated white blood cell count 15.7 respirations 20 heart rate 118 meeting criteria for sepsis. Chest x-ray show evidence of mild left base opacities could not rule out pneumonia. ABG was significant for large stool burden. UA has been unremarkable. Patient denies any cough no vomiting nausea or diarrhea. He was not aware that he was febrile.  Emergency department patient was given Zithromax and Rocephin as well as 2 L normal saline bolus.   He has known history of CLL followed by oncology. 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 He's been declared to be disease free and has been discharged by oncology.  Patient has known history of chronic kidney disease at baseline creatinine at 1.8  Hospitalist was called for admission for CAP  Review of Systems:    Pertinent positives include: Fevers, chills,   Constitutional:  No weight loss, night sweats, fatigue, weight loss  HEENT:  No headaches, Difficulty swallowing,Tooth/dental problems,Sore  throat,  No sneezing, itching, ear ache, nasal congestion, post nasal drip,  Cardio-vascular:  No chest pain, Orthopnea, PND, anasarca, dizziness, palpitations.no Bilateral lower extremity swelling  GI:  No heartburn, indigestion, abdominal pain, nausea, vomiting, diarrhea, change in bowel habits, loss of appetite, melena, blood in stool, hematemesis Resp:  no shortness of breath at rest. No dyspnea on exertion, No excess mucus, no productive cough, No non-productive cough, No coughing up of blood.No change in color of mucus.No wheezing. Skin:  no rash or lesions. No jaundice GU:  no dysuria, change in color of urine, no urgency or frequency. No straining to urinate.  No flank pain.  Musculoskeletal:  No joint pain or no joint swelling. No decreased range of motion. No back pain.  Psych:  No change in mood or affect. No depression or anxiety. No memory loss.  Neuro: no localizing neurological complaints, no tingling, no weakness, no double vision, no gait abnormality, no slurred speech, no confusion  Otherwise ROS are negative except for above, 10 systems were reviewed  Past Medical History: Past Medical History  Diagnosis Date  . Chronic kidney disease 09/20/2008  . Paranoid schizophrenia   . Hiatal hernia   . NHL (non-Hodgkin's lymphoma) dx'd 08/2008 rt groin    chemo comp 08/2008  . CLL (chronic lymphoblastic leukemia) 10/2003  . cll dx'd 10/2003    no rx  . Non Hodgkin's lymphoma 08/2008  . GERD (gastroesophageal reflux disease)    History reviewed. No pertinent past surgical history.   Medications: Prior to Admission medications   Medication Sig Start Date End Date Taking? Authorizing Provider  aspirin EC 81 MG tablet Take 81 mg by mouth daily.   Yes Historical  Provider, MD  benztropine (COGENTIN) 0.5 MG tablet Take 0.5 mg by mouth daily.    Yes Historical Provider, MD  chlorproMAZINE (THORAZINE) 50 MG tablet Take 50 mg by mouth 2 (two) times daily.   Yes Historical  Provider, MD  cholecalciferol (VITAMIN D) 1000 UNITS tablet Take 1,000 Units by mouth daily.   Yes Historical Provider, MD  clozapine (CLOZARIL) 200 MG tablet Take 200 mg by mouth 2 (two) times daily.   Yes Historical Provider, MD  clozapine (CLOZARIL) 200 MG tablet Take 200 mg by mouth 2 (two) times daily.   Yes Historical Provider, MD  docusate sodium (COLACE) 100 MG capsule Take 100 mg by mouth daily.   Yes Historical Provider, MD  escitalopram (LEXAPRO) 10 MG tablet Take 10 mg by mouth daily before breakfast.    Yes Historical Provider, MD  ezetimibe (ZETIA) 10 MG tablet Take 10 mg by mouth daily.   Yes Historical Provider, MD  ketoconazole (NIZORAL) 2 % cream Apply 1 application topically 2 (two) times daily.    Yes Historical Provider, MD  lamoTRIgine (LAMICTAL) 25 MG tablet Take 50 mg by mouth 2 (two) times daily.    Yes Historical Provider, MD  levothyroxine (SYNTHROID, LEVOTHROID) 100 MCG tablet Take 100 mcg by mouth daily before breakfast.    Yes Historical Provider, MD  mineral oil liquid Place into the right ear once a week. Instill 2 drops into both ears once weekly   Yes Historical Provider, MD  Omega-3 Fatty Acids 1000 MG CPDR Take 4,000 mg by mouth daily.    Yes Historical Provider, MD  polyethylene glycol (MIRALAX / GLYCOLAX) packet Take 17 g by mouth daily.   Yes Historical Provider, MD  senna (SENOKOT) 8.6 MG tablet Take 1 tablet by mouth daily.   Yes Historical Provider, MD    Allergies:  No Known Allergies  Social History:  Ambulatory independently     From facility group home   reports that he has never smoked. He does not have any smokeless tobacco history on file. He reports that he does not drink alcohol or use illicit drugs.    Family History: family history includes Alcohol abuse in his father.    Physical Exam: Patient Vitals for the past 24 hrs:  BP Temp Temp src Pulse Resp SpO2 Height Weight  03/27/15 1800 (!) 132/51 mmHg - - - 20 93 % - -  03/27/15  1600 134/57 mmHg - - 118 18 93 % - -  03/27/15 1537 114/67 mmHg 102.6 F (39.2 C) Rectal 118 - 93 % 6\' 1"  (1.854 m) 129.275 kg (285 lb)    1. General:  in No Acute distress 2. Psychological: Alert but not fully Oriented 3. Head/ENT:     Dry Mucous Membranes                          Head Non traumatic, neck supple                          Normal Dentition 4. SKIN:  decreased Skin turgor,  Skin clean Dry and intact no rash 5. Heart: Regular rate and rhythm no Murmur, Rub or gallop 6. Lungs:  no wheezes occasional crackles   7. Abdomen: Soft, non-tender, Non distended 8. Lower extremities: no clubbing, cyanosis, or edema 9. Neurologically Grossly intact, moving all 4 extremities equally 10. MSK: Normal range of motion  body mass index is 37.61 kg/(m^2).  Labs on Admission:   Results for orders placed or performed during the hospital encounter of 03/27/15 (from the past 24 hour(s))  Comprehensive metabolic panel *Canceled*     Status: None ()   Collection Time: 03/27/15  4:01 PM   Narrative   LIS Cancel (ORR/DE = Data Error)  CBC with Differential *Canceled*     Status: None ()   Collection Time: 03/27/15  4:01 PM   Narrative   LIS Cancel (ORR/DE = Data Error)  Culture, blood (routine x 2) *Canceled*     Status: None ()   Collection Time: 03/27/15  4:01 PM   Narrative   LIS Cancel (ORR/DE = Data Error)  Culture, blood (routine x 2) *Canceled*     Status: None ()   Collection Time: 03/27/15  4:06 PM   Narrative   LIS Cancel (ORR/DE = Data Error)  CBC with Differential *Canceled*     Status: None ()   Collection Time: 03/27/15  4:13 PM   Narrative   LIS Cancel (ORR/DE = Data Error)  Comprehensive metabolic panel *Canceled*     Status: None ()   Collection Time: 03/27/15  4:14 PM   Narrative   LIS Cancel (ORR/DE = Data Error)  Lipase, blood     Status: None   Collection Time: 03/27/15  4:20 PM  Result Value Ref Range   Lipase 25 22 - 51 U/L  CBC with Differential      Status: Abnormal   Collection Time: 03/27/15  4:20 PM  Result Value Ref Range   WBC 15.7 (H) 4.0 - 10.5 K/uL   RBC 4.79 4.22 - 5.81 MIL/uL   Hemoglobin 14.4 13.0 - 17.0 g/dL   HCT 44.6 39.0 - 52.0 %   MCV 93.1 78.0 - 100.0 fL   MCH 30.1 26.0 - 34.0 pg   MCHC 32.3 30.0 - 36.0 g/dL   RDW 13.5 11.5 - 15.5 %   Platelets 107 (L) 150 - 400 K/uL   Neutrophils Relative % 90 (H) 43 - 77 %   Neutro Abs 14.1 (H) 1.7 - 7.7 K/uL   Lymphocytes Relative 5 (L) 12 - 46 %   Lymphs Abs 0.8 0.7 - 4.0 K/uL   Monocytes Relative 5 3 - 12 %   Monocytes Absolute 0.8 0.1 - 1.0 K/uL   Eosinophils Relative 0 0 - 5 %   Eosinophils Absolute 0.0 0.0 - 0.7 K/uL   Basophils Relative 0 0 - 1 %   Basophils Absolute 0.0 0.0 - 0.1 K/uL  Comprehensive metabolic panel     Status: Abnormal   Collection Time: 03/27/15  4:20 PM  Result Value Ref Range   Sodium 140 135 - 145 mmol/L   Potassium 4.6 3.5 - 5.1 mmol/L   Chloride 106 101 - 111 mmol/L   CO2 26 22 - 32 mmol/L   Glucose, Bld 138 (H) 70 - 99 mg/dL   BUN 28 (H) 6 - 20 mg/dL   Creatinine, Ser 2.05 (H) 0.61 - 1.24 mg/dL   Calcium 9.7 8.9 - 10.3 mg/dL   Total Protein 6.9 6.5 - 8.1 g/dL   Albumin 4.4 3.5 - 5.0 g/dL   AST 20 15 - 41 U/L   ALT 17 17 - 63 U/L   Alkaline Phosphatase 76 38 - 126 U/L   Total Bilirubin 0.7 0.3 - 1.2 mg/dL   GFR calc non Af Amer 33 (L) >60 mL/min   GFR calc Af Amer 39 (L) >60 mL/min  Anion gap 8 5 - 15  I-Stat CG4 Lactic Acid, ED (Not at [facility] or [facility])     Status: Abnormal   Collection Time: 03/27/15  4:36 PM  Result Value Ref Range   Lactic Acid, Venous 3.44 (HH) 0.5 - 2.0 mmol/L   Comment NOTIFIED PHYSICIAN   I-stat chem 8, ed     Status: Abnormal   Collection Time: 03/27/15  4:49 PM  Result Value Ref Range   Sodium 141 135 - 145 mmol/L   Potassium 4.4 3.5 - 5.1 mmol/L   Chloride 105 101 - 111 mmol/L   BUN 28 (H) 6 - 20 mg/dL   Creatinine, Ser 1.90 (H) 0.61 - 1.24 mg/dL   Glucose, Bld 137 (H) 70 - 99 mg/dL   Calcium, Ion  1.26 1.13 - 1.30 mmol/L   TCO2 21 0 - 100 mmol/L   Hemoglobin 15.6 13.0 - 17.0 g/dL   HCT 46.0 39.0 - 52.0 %  Urinalysis, Routine w reflex microscopic     Status: None   Collection Time: 03/27/15  5:49 PM  Result Value Ref Range   Color, Urine YELLOW YELLOW   APPearance CLEAR CLEAR   Specific Gravity, Urine 1.014 1.005 - 1.030   pH 5.5 5.0 - 8.0   Glucose, UA NEGATIVE NEGATIVE mg/dL   Hgb urine dipstick NEGATIVE NEGATIVE   Bilirubin Urine NEGATIVE NEGATIVE   Ketones, ur NEGATIVE NEGATIVE mg/dL   Protein, ur NEGATIVE NEGATIVE mg/dL   Urobilinogen, UA 0.2 0.0 - 1.0 mg/dL   Nitrite NEGATIVE NEGATIVE   Leukocytes, UA NEGATIVE NEGATIVE    UA no evidence of UTI  Lab Results  Component Value Date   HGBA1C  10/11/2009    5.1 (NOTE) The ADA recommends the following therapeutic goal for glycemic control related to Hgb A1c measurement: Goal of therapy: <6.5 Hgb A1c  Reference: American Diabetes Association: Clinical Practice Recommendations 2010, Diabetes Care, 2010, 33: (Suppl  1).    Estimated Creatinine Clearance: 57.6 mL/min (by C-G formula based on Cr of 1.9).  BNP (last 3 results) No results for input(s): PROBNP in the last 8760 hours.  Other results:  I have pearsonaly reviewed this: ECG REPORT  Rate: 116  Rhythm: Right bundle branch block ST&T Change: No evidence of ischemia QTC 646 calculated QTC likely less   Filed Weights   03/27/15 1537  Weight: 129.275 kg (285 lb)     Cultures:    Component Value Date/Time   SDES URINE, RANDOM 12/24/2012 1439   SPECREQUEST NONE 12/24/2012 1439   CULT NO GROWTH 5 DAYS 12/24/2012 1106   REPTSTATUS 12/25/2012 FINAL 12/24/2012 1439     Radiological Exams on Admission: Dg Chest 2 View  03/27/2015   CLINICAL DATA:  Tremors, onset at noon today. Weakness, fever and chills.  EXAM: CHEST  2 VIEW  COMPARISON:  11/17/2014  FINDINGS: There is mild linear atelectasis or infiltrate in the left lung base as well as a probable small  left effusion. The right lung is clear. Hilar, mediastinal and cardiac contours are unremarkable and unchanged.  IMPRESSION: Mild left base linear opacities and probable small effusion, possibly due to pneumonia. Followup PA and lateral chest X-ray is recommended in 3-4 weeks to ensure resolution and exclude underlying malignancy.   Electronically Signed   By: Andreas Newport M.D.   On: 03/27/2015 17:44   Dg Abd 1 View  03/27/2015   CLINICAL DATA:  Abdominal distention and fever. Weakness. Chills. Non-Hodgkin's lymphoma  EXAM: ABDOMEN - 1 VIEW  COMPARISON:  None.  FINDINGS: No evidence of dilated bowel loops. A large stool burden is seen throughout the majority of the colon. No radiopaque calculi or signs of abnormal mass effect identified.  IMPRESSION: No acute findings.  Large stool burden noted; suggest clinical correlation for possible constipation.   Electronically Signed   By: Earle Gell M.D.   On: 03/27/2015 17:47    Chart has been reviewed  Family not at  Bedside    Assessment/Plan  64 year old gentleman with history of schizophrenia presents with evidence of sepsis most likely secondary to pneumonia  Present on Admission:  . Sepsis  - no evidence of hypotension. Will admit for close observation to telemetry and obtain frequent vital signs. Broad-spectrum antibiotics. Follow lactic acid level and pro-calcitonin follow-up results of blood cultures  . CAP (community acquired pneumonia) broad-spectrum antibiotics given sepsis  . CKD (chronic kidney disease), stage III appears to be around baseline we'll continue to monitor  . CLL (chronic lymphocytic leukemia) stable per her history is in remission = . Schizophrenia - will continue home medications monitor on telemetry given abnormal EKG, it is unclear if current mental state is at baseline. Patient appears to be persevering   evidence of prolonged QT avoid administration of any additional medications that could prolong QT interval.  Monitor on telemetry.   Prophylaxis:   Lovenox   CODE STATUS:  FULL CODE   Disposition:    Back to current facility when stable                             Other plan as per orders.  I have spent a total of 55 min on this admission  July Nickson 03/27/2015, 7:04 PM  [facility]  Pager [phone]   after 2 AM please page floor coverage PA If 7AM-7PM, please contact the day team taking care of the patient  [email or web address]  Password [email or web address]

## 2015-03-28 DIAGNOSIS — A419 Sepsis, unspecified organism: Principal | ICD-10-CM

## 2015-03-28 DIAGNOSIS — F203 Undifferentiated schizophrenia: Secondary | ICD-10-CM

## 2015-03-28 DIAGNOSIS — L03115 Cellulitis of right lower limb: Secondary | ICD-10-CM

## 2015-03-28 DIAGNOSIS — N183 Chronic kidney disease, stage 3 (moderate): Secondary | ICD-10-CM

## 2015-03-28 DIAGNOSIS — C8590 Non-Hodgkin lymphoma, unspecified, unspecified site: Secondary | ICD-10-CM

## 2015-03-28 DIAGNOSIS — C9110 Chronic lymphocytic leukemia of B-cell type not having achieved remission: Secondary | ICD-10-CM

## 2015-03-28 LAB — CBC WITH DIFFERENTIAL/PLATELET
BASOS PCT: 0 % (ref 0–1)
Basophils Absolute: 0 10*3/uL (ref 0.0–0.1)
Eosinophils Absolute: 0 10*3/uL (ref 0.0–0.7)
Eosinophils Relative: 0 % (ref 0–5)
HCT: 39.6 % (ref 39.0–52.0)
Hemoglobin: 13 g/dL (ref 13.0–17.0)
LYMPHS ABS: 0.7 10*3/uL (ref 0.7–4.0)
LYMPHS PCT: 3 % — AB (ref 12–46)
MCH: 30.6 pg (ref 26.0–34.0)
MCHC: 32.8 g/dL (ref 30.0–36.0)
MCV: 93.2 fL (ref 78.0–100.0)
MONO ABS: 1 10*3/uL (ref 0.1–1.0)
Monocytes Relative: 4 % (ref 3–12)
NEUTROS ABS: 21.8 10*3/uL — AB (ref 1.7–7.7)
Neutrophils Relative %: 93 % — ABNORMAL HIGH (ref 43–77)
PLATELETS: 117 10*3/uL — AB (ref 150–400)
RBC: 4.25 MIL/uL (ref 4.22–5.81)
RDW: 13.9 % (ref 11.5–15.5)
WBC: 23.5 10*3/uL — AB (ref 4.0–10.5)

## 2015-03-28 LAB — COMPREHENSIVE METABOLIC PANEL
ALBUMIN: 3.5 g/dL (ref 3.5–5.0)
ALT: 14 U/L — ABNORMAL LOW (ref 17–63)
AST: 20 U/L (ref 15–41)
Alkaline Phosphatase: 59 U/L (ref 38–126)
Anion gap: 10 (ref 5–15)
BUN: 26 mg/dL — AB (ref 6–20)
CALCIUM: 8.8 mg/dL — AB (ref 8.9–10.3)
CHLORIDE: 106 mmol/L (ref 101–111)
CO2: 24 mmol/L (ref 22–32)
CREATININE: 2.12 mg/dL — AB (ref 0.61–1.24)
GFR calc Af Amer: 37 mL/min — ABNORMAL LOW (ref >60)
GFR calc non Af Amer: 32 mL/min — ABNORMAL LOW (ref >60)
Glucose, Bld: 165 mg/dL — ABNORMAL HIGH (ref 65–99)
Potassium: 4.3 mmol/L (ref 3.5–5.1)
Sodium: 140 mmol/L (ref 135–145)
TOTAL PROTEIN: 5.9 g/dL — AB (ref 6.5–8.1)
Total Bilirubin: 0.8 mg/dL (ref 0.3–1.2)

## 2015-03-28 LAB — INFLUENZA PANEL BY PCR (TYPE A & B)
H1N1 flu by pcr: NOT DETECTED
Influenza A By PCR: NEGATIVE
Influenza B By PCR: NEGATIVE

## 2015-03-28 LAB — HIV ANTIBODY (ROUTINE TESTING W REFLEX): HIV SCREEN 4TH GENERATION: NONREACTIVE

## 2015-03-28 LAB — STREP PNEUMONIAE URINARY ANTIGEN: STREP PNEUMO URINARY ANTIGEN: NEGATIVE

## 2015-03-28 LAB — LACTIC ACID, PLASMA
LACTIC ACID, VENOUS: 0.9 mmol/L (ref 0.5–2.0)
Lactic Acid, Venous: 1.4 mmol/L (ref 0.5–2.0)
Lactic Acid, Venous: 1 mmol/L (ref 0.5–2.0)

## 2015-03-28 MED ORDER — SODIUM CHLORIDE 0.9 % IV SOLN
INTRAVENOUS | Status: DC
  Administered 2015-03-28 – 2015-03-29 (×3): via INTRAVENOUS

## 2015-03-28 NOTE — Clinical Social Work Note (Signed)
Clinical Social Work Assessment  Patient Details  Name: [name] MRN: [id number] Date of Birth: January 30, 1951  Date of referral:  03/28/15               Reason for consult:  Facility Placement                Permission sought to share information with:  Facility Art therapist granted to share information::  Yes, Verbal Permission Granted  Name::        Agency::     Relationship::     Contact Information:     Housing/Transportation Living arrangements for the past 2 months:  [place] of Information:  Patient, Facility Patient Interpreter Needed:  None Criminal Activity/Legal Involvement Pertinent to Current Situation/Hospitalization:    Significant Relationships:  Other(Comment) (facility residents) Lives with:  Facility Resident Do you feel safe going back to the place where you live?  Yes Need for family participation in patient care:  No (Coment)  Care giving concerns:  CSW received consult that patient was admitted from [facility].    Social Worker assessment / plan:  CSW confirmed with patient & [name] at ALF that plan is for patient to return to ALF at discharge.   Employment status:    Insurance information:  Medicare PT Recommendations:  Not assessed at this time Information / Referral to community resources:     Patient/Family's Response to care:  Patient is agreeable with plan to return to ALF, CSW attempted to reach primary contact - [name] though [name] at ALF informed CSW that she is his caregiver but is currently out of the country.   Patient/Family's Understanding of and Emotional Response to Diagnosis, Current Treatment, and Prognosis:    Emotional Assessment Appearance:  Appears stated age Attitude/Demeanor/Rapport:    Affect (typically observed):    Orientation:  Oriented to Self, Oriented to Place, Oriented to  Time, Oriented to Situation Alcohol / Substance use:    Psych involvement  (Current and /or in the community):     Discharge Needs  Concerns to be addressed:    Readmission within the last 30 days:    Current discharge risk:    Barriers to Discharge:      Standley Brooking, LCSW 03/28/2015, 3:19 PM

## 2015-03-28 NOTE — Progress Notes (Signed)
[facility] PROGRESS NOTE   Assessment/Plan: Sepsis due to  Cellulitis of right lower extremity - change antibiotics to rocephin and vanc. - bc pending. - I have personally review CXR and does not appear to be and infectious etiology. - lactic acid cont to be elevated, repeat lactici acid resume IV fluids close I and O's.    CKD (chronic kidney disease), stage III: - at baseline we'll continue to monitor   CLL (chronic lymphocytic leukemia): - in remission.  .Schizophrenia: - cont home regimen.    Code Status: full Family Communication: none  Disposition Plan: 2-3 days   Consultants:  none  Procedures:  CXT  Antibiotics:  Vanc and rocpehin 5.11.2016  HPI/Subjective: Relates some right leg pain  Objective: Filed Vitals:   03/27/15 2257 03/28/15 0058 03/28/15 0259 03/28/15 0501  BP: 99/60 107/46 112/63 114/47  Pulse:  102  105  Temp:  98.9 F (37.2 C)  98.2 F (36.8 C)  TempSrc:  Oral  Oral  Resp:  21  19  Height:      Weight:      SpO2:  99%  98%    Intake/Output Summary (Last 24 hours) at 03/28/15 1144 Last data filed at 03/28/15 1000  Gross per 24 hour  Intake 4378.75 ml  Output    800 ml  Net 3578.75 ml   Filed Weights   03/27/15 1537 03/27/15 2118  Weight: 129.275 kg (285 lb) 129.774 kg (286 lb 1.6 oz)    Exam:  General: Alert, awake, oriented x3, in no acute distress.  HEENT: No bruits, no goiter.  Heart: Regular rate and rhythm, without murmurs, rubs, gallops.  Lungs: Good air movement, bilateral air movement.  Abdomen: Soft, nontender, nondistended, positive bowel sounds.  SKIN: right lef warm and tender to touch, erythematous.   Data Reviewed: Basic Metabolic Panel:  Recent Labs Lab 03/27/15 1620 03/27/15 1649 03/27/15 2213 03/28/15 0410  NA 140 141  --  140  K 4.6 4.4  --  4.3  CL 106 105  --  106  CO2 26  --   --  24  GLUCOSE 138* 137*  --  165*  BUN 28* 28*  --  26*  CREATININE 2.05* 1.90* 2.15* 2.12*    CALCIUM 9.7  --   --  8.8*   Liver Function Tests:  Recent Labs Lab 03/27/15 1620 03/28/15 0410  AST 20 20  ALT 17 14*  ALKPHOS 76 59  BILITOT 0.7 0.8  PROT 6.9 5.9*  ALBUMIN 4.4 3.5    Recent Labs Lab 03/27/15 1620  LIPASE 25   No results for input(s): AMMONIA in the last 168 hours. CBC:  Recent Labs Lab 03/27/15 1620 03/27/15 1649 03/27/15 2213 03/28/15 0410  WBC 15.7*  --  22.7* 23.5*  NEUTROABS 14.1*  --   --  21.8*  HGB 14.4 15.6 12.9* 13.0  HCT 44.6 46.0 39.5 39.6  MCV 93.1  --  93.6 93.2  PLT 107*  --  126* 117*   Cardiac Enzymes: No results for input(s): CKTOTAL, CKMB, CKMBINDEX, TROPONINI in the last 168 hours. BNP (last 3 results) No results for input(s): BNP in the last 8760 hours.  ProBNP (last 3 results) No results for input(s): PROBNP in the last 8760 hours.  CBG: No results for input(s): GLUCAP in the last 168 hours.  No results found for this or any previous visit (from the past 240 hour(s)).   Studies: Dg Chest 2 View  03/27/2015  CLINICAL DATA:  Tremors, onset at noon today. Weakness, fever and chills.  EXAM: CHEST  2 VIEW  COMPARISON:  11/17/2014  FINDINGS: There is mild linear atelectasis or infiltrate in the left lung base as well as a probable small left effusion. The right lung is clear. Hilar, mediastinal and cardiac contours are unremarkable and unchanged.  IMPRESSION: Mild left base linear opacities and probable small effusion, possibly due to pneumonia. Followup PA and lateral chest X-ray is recommended in 3-4 weeks to ensure resolution and exclude underlying malignancy.   Electronically Signed   By: Andreas Newport M.D.   On: 03/27/2015 17:44   Dg Abd 1 View  03/27/2015   CLINICAL DATA:  Abdominal distention and fever. Weakness. Chills. Non-Hodgkin's lymphoma  EXAM: ABDOMEN - 1 VIEW  COMPARISON:  None.  FINDINGS: No evidence of dilated bowel loops. A large stool burden is seen throughout the majority of the colon. No radiopaque  calculi or signs of abnormal mass effect identified.  IMPRESSION: No acute findings.  Large stool burden noted; suggest clinical correlation for possible constipation.   Electronically Signed   By: Earle Gell M.D.   On: 03/27/2015 17:47    Scheduled Meds: . aspirin EC  81 mg Oral Daily  . benztropine  0.5 mg Oral Daily  . chlorproMAZINE  50 mg Oral BID  . clozapine  200 mg Oral BID  . docusate sodium  100 mg Oral Daily  . enoxaparin (LOVENOX) injection  40 mg Subcutaneous Q24H  . escitalopram  10 mg Oral QAC breakfast  . ezetimibe  10 mg Oral Daily  . lamoTRIgine  50 mg Oral BID  . levothyroxine  100 mcg Oral QAC breakfast  . piperacillin-tazobactam (ZOSYN)  IV  3.375 g Intravenous Q8H  . polyethylene glycol  17 g Oral Daily  . senna  1 tablet Oral Daily  . vancomycin  1,500 mg Intravenous Q24H   Continuous Infusions: . sodium chloride      Time Spent:25 min  Charlynne Cousins  [facility] Pager [phone]. If 7PM-7AM, please contact night-coverage at [email or web address], password [facility] 03/28/2015, 11:44 AM  LOS: 1 day

## 2015-03-29 LAB — URINE CULTURE
Colony Count: NO GROWTH
Culture: NO GROWTH

## 2015-03-29 LAB — LEGIONELLA ANTIGEN, URINE

## 2015-03-29 LAB — CBC
HCT: 35.8 % — ABNORMAL LOW (ref 39.0–52.0)
Hemoglobin: 11.5 g/dL — ABNORMAL LOW (ref 13.0–17.0)
MCH: 30.3 pg (ref 26.0–34.0)
MCHC: 32.1 g/dL (ref 30.0–36.0)
MCV: 94.2 fL (ref 78.0–100.0)
Platelets: 112 10*3/uL — ABNORMAL LOW (ref 150–400)
RBC: 3.8 MIL/uL — ABNORMAL LOW (ref 4.22–5.81)
RDW: 14.3 % (ref 11.5–15.5)
WBC: 12 10*3/uL — ABNORMAL HIGH (ref 4.0–10.5)

## 2015-03-29 LAB — PROCALCITONIN: Procalcitonin: 5.01 ng/mL

## 2015-03-29 MED ORDER — CEFTRIAXONE SODIUM IN DEXTROSE 20 MG/ML IV SOLN
1.0000 g | INTRAVENOUS | Status: DC
  Administered 2015-03-29: 1 g via INTRAVENOUS
  Filled 2015-03-29 (×2): qty 50

## 2015-03-29 NOTE — Progress Notes (Signed)
[facility] PROGRESS NOTE   Assessment/Plan: Sepsis due to  Cellulitis of right lower extremity - Cont antibiotics to rocephin and vanc. - bc negative. - lactic acidosis resolved. Afebrile leukocytosis improving.    CKD (chronic kidney disease), stage III: - at baseline we'll continue to monitor   CLL (chronic lymphocytic leukemia): - in remission.  .Schizophrenia: - cont home regimen.    Code Status: full Family Communication: none  Disposition Plan: 2-3 days   Consultants:  none  Procedures:  CXT  Antibiotics:  Vanc and rocpehin 5.11.2016  HPI/Subjective: Pain imrpoved.  Objective: Filed Vitals:   03/28/15 0501 03/28/15 1410 03/28/15 2013 03/29/15 0459  BP: 114/47 114/57 118/57 90/46  Pulse: 105 93 94 87  Temp: 98.2 F (36.8 C) 98.3 F (36.8 C) 98 F (36.7 C) 98.4 F (36.9 C)  TempSrc: Oral Oral Oral Axillary  Resp: 19 20 20 17   Height:      Weight:      SpO2: 98% 98% 99% 99%    Intake/Output Summary (Last 24 hours) at 03/29/15 1226 Last data filed at 03/29/15 1107  Gross per 24 hour  Intake 1623.75 ml  Output   1050 ml  Net 573.75 ml   Filed Weights   03/27/15 1537 03/27/15 2118  Weight: 129.275 kg (285 lb) 129.774 kg (286 lb 1.6 oz)    Exam:  General: Alert, awake, oriented x3, in no acute distress.  HEENT: No bruits, no goiter.  Heart: Regular rate and rhythm, without murmurs, rubs, gallops.  Lungs: Good air movement, bilateral air movement.  Abdomen: Soft, nontender, nondistended, positive bowel sounds.  SKIN: right lef warm and tender to touch, erythematous.   Data Reviewed: Basic Metabolic Panel:  Recent Labs Lab 03/27/15 1620 03/27/15 1649 03/27/15 2213 03/28/15 0410  NA 140 141  --  140  K 4.6 4.4  --  4.3  CL 106 105  --  106  CO2 26  --   --  24  GLUCOSE 138* 137*  --  165*  BUN 28* 28*  --  26*  CREATININE 2.05* 1.90* 2.15* 2.12*  CALCIUM 9.7  --   --  8.8*   Liver Function Tests:  Recent  Labs Lab 03/27/15 1620 03/28/15 0410  AST 20 20  ALT 17 14*  ALKPHOS 76 59  BILITOT 0.7 0.8  PROT 6.9 5.9*  ALBUMIN 4.4 3.5    Recent Labs Lab 03/27/15 1620  LIPASE 25   No results for input(s): AMMONIA in the last 168 hours. CBC:  Recent Labs Lab 03/27/15 1620 03/27/15 1649 03/27/15 2213 03/28/15 0410 03/29/15 0355  WBC 15.7*  --  22.7* 23.5* 12.0*  NEUTROABS 14.1*  --   --  21.8*  --   HGB 14.4 15.6 12.9* 13.0 11.5*  HCT 44.6 46.0 39.5 39.6 35.8*  MCV 93.1  --  93.6 93.2 94.2  PLT 107*  --  126* 117* 112*   Cardiac Enzymes: No results for input(s): CKTOTAL, CKMB, CKMBINDEX, TROPONINI in the last 168 hours. BNP (last 3 results) No results for input(s): BNP in the last 8760 hours.  ProBNP (last 3 results) No results for input(s): PROBNP in the last 8760 hours.  CBG: No results for input(s): GLUCAP in the last 168 hours.  Recent Results (from the past 240 hour(s))  Urine culture     Status: None   Collection Time: 03/27/15  5:49 PM  Result Value Ref Range Status   Specimen Description URINE, RANDOM  Final  Special Requests NONE  Final   Colony Count NO GROWTH Performed at [facility]   Final   Culture NO GROWTH Performed at [facility]   Final   Report Status 03/29/2015 FINAL  Final     Studies: Dg Chest 2 View  03/27/2015   CLINICAL DATA:  Tremors, onset at noon today. Weakness, fever and chills.  EXAM: CHEST  2 VIEW  COMPARISON:  11/17/2014  FINDINGS: There is mild linear atelectasis or infiltrate in the left lung base as well as a probable small left effusion. The right lung is clear. Hilar, mediastinal and cardiac contours are unremarkable and unchanged.  IMPRESSION: Mild left base linear opacities and probable small effusion, possibly due to pneumonia. Followup PA and lateral chest X-ray is recommended in 3-4 weeks to ensure resolution and exclude underlying malignancy.   Electronically Signed   By: Andreas Newport M.D.   On:  03/27/2015 17:44   Dg Abd 1 View  03/27/2015   CLINICAL DATA:  Abdominal distention and fever. Weakness. Chills. Non-Hodgkin's lymphoma  EXAM: ABDOMEN - 1 VIEW  COMPARISON:  None.  FINDINGS: No evidence of dilated bowel loops. A large stool burden is seen throughout the majority of the colon. No radiopaque calculi or signs of abnormal mass effect identified.  IMPRESSION: No acute findings.  Large stool burden noted; suggest clinical correlation for possible constipation.   Electronically Signed   By: Earle Gell M.D.   On: 03/27/2015 17:47    Scheduled Meds: . aspirin EC  81 mg Oral Daily  . benztropine  0.5 mg Oral Daily  . cefTRIAXone (ROCEPHIN)  IV  1 g Intravenous Q24H  . chlorproMAZINE  50 mg Oral BID  . clozapine  200 mg Oral BID  . docusate sodium  100 mg Oral Daily  . enoxaparin (LOVENOX) injection  40 mg Subcutaneous Q24H  . escitalopram  10 mg Oral QAC breakfast  . ezetimibe  10 mg Oral Daily  . lamoTRIgine  50 mg Oral BID  . levothyroxine  100 mcg Oral QAC breakfast  . polyethylene glycol  17 g Oral Daily  . senna  1 tablet Oral Daily  . vancomycin  1,500 mg Intravenous Q24H   Continuous Infusions: . sodium chloride 75 mL/hr at 03/29/15 0433    Time Spent:25 min  Charlynne Cousins  [facility] Pager [phone]. If 7PM-7AM, please contact night-coverage at [email or web address], password [facility] 03/29/2015, 12:26 PM  LOS: 2 days

## 2015-03-30 LAB — BASIC METABOLIC PANEL
Anion gap: 8 (ref 5–15)
BUN: 19 mg/dL (ref 6–20)
CALCIUM: 8.9 mg/dL (ref 8.9–10.3)
CO2: 25 mmol/L (ref 22–32)
Chloride: 113 mmol/L — ABNORMAL HIGH (ref 101–111)
Creatinine, Ser: 1.72 mg/dL — ABNORMAL HIGH (ref 0.61–1.24)
GFR calc non Af Amer: 41 mL/min — ABNORMAL LOW (ref >60)
GFR, EST AFRICAN AMERICAN: 48 mL/min — AB (ref >60)
Glucose, Bld: 137 mg/dL — ABNORMAL HIGH (ref 65–99)
Potassium: 4.3 mmol/L (ref 3.5–5.1)
Sodium: 146 mmol/L — ABNORMAL HIGH (ref 135–145)

## 2015-03-30 LAB — CLOSTRIDIUM DIFFICILE BY PCR: CDIFFPCR: NEGATIVE

## 2015-03-30 MED ORDER — CETYLPYRIDINIUM CHLORIDE 0.05 % MT LIQD
7.0000 mL | Freq: Two times a day (BID) | OROMUCOSAL | Status: DC
  Administered 2015-03-30 – 2015-03-31 (×3): 7 mL via OROMUCOSAL

## 2015-03-30 MED ORDER — SULFAMETHOXAZOLE-TRIMETHOPRIM 800-160 MG PO TABS
1.0000 | ORAL_TABLET | Freq: Two times a day (BID) | ORAL | Status: DC
  Administered 2015-03-30 – 2015-03-31 (×3): 1 via ORAL
  Filled 2015-03-30 (×5): qty 1

## 2015-03-30 MED ORDER — FUROSEMIDE 10 MG/ML IJ SOLN
20.0000 mg | Freq: Once | INTRAMUSCULAR | Status: AC
  Administered 2015-03-30: 20 mg via INTRAVENOUS
  Filled 2015-03-30: qty 2

## 2015-03-30 MED ORDER — SODIUM CHLORIDE 0.45 % IV SOLN: INTRAVENOUS | Status: DC

## 2015-03-30 NOTE — Progress Notes (Signed)
[facility] PROGRESS NOTE   Assessment/Plan: Sepsis due to  Cellulitis of right lower extremity - De-escalate antibiotics treatement to bactrim. - bc negative. - lactic acidosis resolved. Afebrile leukocytosis improving. - KVO IV fluids  CKD (chronic kidney disease), stage III: - at baseline we'll continue to monitor   CLL (chronic lymphocytic leukemia): - in remission.  Schizophrenia: - cont home regimen.    Code Status: full Family Communication: none  Disposition Plan: 2-3 days   Consultants:  none  Procedures:  CXT  Antibiotics:  Vanc and rocpehin 5.11.2016  HPI/Subjective: No complains  Objective: Filed Vitals:   03/29/15 1518 03/29/15 1832 03/29/15 2054 03/30/15 0353  BP: 160/63 131/63 130/68 144/79  Pulse: 92 88 93 97  Temp: 98.4 F (36.9 C) 98.5 F (36.9 C) 98 F (36.7 C) 98.5 F (36.9 C)  TempSrc: Oral Oral Oral Oral  Resp: 18 20 16 20   Height:      Weight:      SpO2: 100% 100% 100% 97%    Intake/Output Summary (Last 24 hours) at 03/30/15 1034 Last data filed at 03/30/15 0647  Gross per 24 hour  Intake 2888.75 ml  Output    700 ml  Net 2188.75 ml   Filed Weights   03/27/15 1537 03/27/15 2118  Weight: 129.275 kg (285 lb) 129.774 kg (286 lb 1.6 oz)    Exam:  General: Alert, awake, oriented x3, in no acute distress.  HEENT: No bruits, no goiter.  Heart: Regular rate and rhythm, without murmurs, rubs, gallops.  Lungs: Good air movement, bilateral air movement.  Abdomen: Soft, nontender, nondistended, positive bowel sounds.  SKIN: erythema improving   Data Reviewed: Basic Metabolic Panel:  Recent Labs Lab 03/27/15 1620 03/27/15 1649 03/27/15 2213 03/28/15 0410 03/30/15 0538  NA 140 141  --  140 146*  K 4.6 4.4  --  4.3 4.3  CL 106 105  --  106 113*  CO2 26  --   --  24 25  GLUCOSE 138* 137*  --  165* 137*  BUN 28* 28*  --  26* 19  CREATININE 2.05* 1.90* 2.15* 2.12* 1.72*  CALCIUM 9.7  --   --  8.8* 8.9    Liver Function Tests:  Recent Labs Lab 03/27/15 1620 03/28/15 0410  AST 20 20  ALT 17 14*  ALKPHOS 76 59  BILITOT 0.7 0.8  PROT 6.9 5.9*  ALBUMIN 4.4 3.5    Recent Labs Lab 03/27/15 1620  LIPASE 25   No results for input(s): AMMONIA in the last 168 hours. CBC:  Recent Labs Lab 03/27/15 1620 03/27/15 1649 03/27/15 2213 03/28/15 0410 03/29/15 0355  WBC 15.7*  --  22.7* 23.5* 12.0*  NEUTROABS 14.1*  --   --  21.8*  --   HGB 14.4 15.6 12.9* 13.0 11.5*  HCT 44.6 46.0 39.5 39.6 35.8*  MCV 93.1  --  93.6 93.2 94.2  PLT 107*  --  126* 117* 112*   Cardiac Enzymes: No results for input(s): CKTOTAL, CKMB, CKMBINDEX, TROPONINI in the last 168 hours. BNP (last 3 results) No results for input(s): BNP in the last 8760 hours.  ProBNP (last 3 results) No results for input(s): PROBNP in the last 8760 hours.  CBG: No results for input(s): GLUCAP in the last 168 hours.  Recent Results (from the past 240 hour(s))  Urine culture     Status: None   Collection Time: 03/27/15  5:49 PM  Result Value Ref Range Status   Specimen  Description URINE, RANDOM  Final   Special Requests NONE  Final   Colony Count NO GROWTH Performed at [facility]   Final   Culture NO GROWTH Performed at [facility]   Final   Report Status 03/29/2015 FINAL  Final     Studies: No results found.  Scheduled Meds: . antiseptic oral rinse  7 mL Mouth Rinse BID  . aspirin EC  81 mg Oral Daily  . benztropine  0.5 mg Oral Daily  . cefTRIAXone (ROCEPHIN)  IV  1 g Intravenous Q24H  . chlorproMAZINE  50 mg Oral BID  . clozapine  200 mg Oral BID  . docusate sodium  100 mg Oral Daily  . enoxaparin (LOVENOX) injection  40 mg Subcutaneous Q24H  . escitalopram  10 mg Oral QAC breakfast  . ezetimibe  10 mg Oral Daily  . lamoTRIgine  50 mg Oral BID  . levothyroxine  100 mcg Oral QAC breakfast  . polyethylene glycol  17 g Oral Daily  . senna  1 tablet Oral Daily  . vancomycin  1,500 mg  Intravenous Q24H   Continuous Infusions: . sodium chloride      Time Spent:25 min  Charlynne Cousins  [facility] Pager [phone]. If 7PM-7AM, please contact night-coverage at [email or web address], password [facility] 03/30/2015, 10:34 AM  LOS: 3 days

## 2015-03-30 NOTE — Progress Notes (Signed)
Spoke with [place] request a phone call when pt. Is to be discharged.

## 2015-03-31 DIAGNOSIS — I4581 Long QT syndrome: Secondary | ICD-10-CM

## 2015-03-31 LAB — BASIC METABOLIC PANEL
Anion gap: 11 (ref 5–15)
BUN: 17 mg/dL (ref 6–20)
CHLORIDE: 108 mmol/L (ref 101–111)
CO2: 24 mmol/L (ref 22–32)
CREATININE: 1.82 mg/dL — AB (ref 0.61–1.24)
Calcium: 8.8 mg/dL — ABNORMAL LOW (ref 8.9–10.3)
GFR calc non Af Amer: 38 mL/min — ABNORMAL LOW (ref >60)
GFR, EST AFRICAN AMERICAN: 45 mL/min — AB (ref >60)
Glucose, Bld: 137 mg/dL — ABNORMAL HIGH (ref 65–99)
Potassium: 4.5 mmol/L (ref 3.5–5.1)
SODIUM: 143 mmol/L (ref 135–145)

## 2015-03-31 LAB — PROCALCITONIN: PROCALCITONIN: 1.74 ng/mL

## 2015-03-31 MED ORDER — SULFAMETHOXAZOLE-TRIMETHOPRIM 800-160 MG PO TABS: 1.0000 | ORAL_TABLET | Freq: Two times a day (BID) | ORAL | Status: DC

## 2015-03-31 NOTE — Discharge Summary (Signed)
Physician Discharge Summary  [name] [id number] DOB: [birth year] DOA: 03/27/2015  PCP: No primary care provider on file.  Admit date: 03/27/2015 Discharge date: 03/31/2015  Time spent: 35 minutes  Recommendations for Outpatient Follow-up:  1. Keep elevated above heart level. 2. Cont oral antibiotics for 10 days.  Discharge Diagnoses:  Principal Problem:   Cellulitis of right lower extremity Active Problems:   CKD (chronic kidney disease), stage III   Schizophrenia   CLL (chronic lymphocytic leukemia)   Non Hodgkin's lymphoma   Sepsis   Prolonged QT interval   Discharge Condition: Stabel  Diet recommendation: regular  Filed Weights   03/27/15 1537 03/27/15 2118 03/31/15 0646  Weight: 129.275 kg (285 lb) 129.774 kg (286 lb 1.6 oz) 130.863 kg (288 lb 8 oz)    History of present illness:  64 y.o. male   has a past medical history of Chronic kidney diseaseCLL Of note patient is a poor historian history is obtained through the emergency department records and review of prior records. Patient is unable to provide much of any insight. Patient has long-standing history of schizophrenia. Baseline mental status is unclear. Per notes has had history of being unable to provide history. He currently resides in a group home. Patient was brought in due to tremors. After father investigation patient was found to be febrile up to 102.6. Elevated white blood cell count 15.7 respirations 20 heart rate 118 meeting criteria for sepsis. C  Hospital Course:  Sepsis due to Cellulitis of right lower extremity: - He was started on IV vancomycin Rocephin, was continue on this for 4 days the redness and the swelling of the right lower extremity cellulitis significantly improved. He needs to get his right leg elevated. - De-escalate antibiotics treatement to bactrim. Her main afebrile - bc negative. - His sepsis was treated with aggressive fluid hydration is active acidosis resolved.  CKD  (chronic kidney disease), stage III: - at baseline we'll continue to monitor   CLL (chronic lymphocytic leukemia): - in remission.  Schizophrenia: No changes are made continue current home medications.    Procedures:  CXR  abd x-ray  Consultations:  none  Discharge Exam: Filed Vitals:   03/31/15 0646  BP: 130/68  Pulse: 94  Temp: 97.5 F (36.4 C)  Resp: 20    General: A&O x3 Cardiovascular: RRR Respiratory: good air movement  Discharge Instructions   Discharge Instructions    Diet - low sodium heart healthy    Complete by:  As directed      Increase activity slowly    Complete by:  As directed           Current Discharge Medication List    START taking these medications   Details  sulfamethoxazole-trimethoprim (BACTRIM DS,SEPTRA DS) 800-160 MG per tablet Take 1 tablet by mouth every 12 (twelve) hours. Qty: 10 tablet, Refills: 0      CONTINUE these medications which have NOT CHANGED   Details  aspirin EC 81 MG tablet Take 81 mg by mouth daily.    benztropine (COGENTIN) 0.5 MG tablet Take 0.5 mg by mouth daily.     chlorproMAZINE (THORAZINE) 50 MG tablet Take 50 mg by mouth 2 (two) times daily.    cholecalciferol (VITAMIN D) 1000 UNITS tablet Take 1,000 Units by mouth daily.    clozapine (CLOZARIL) 200 MG tablet Take 200 mg by mouth 2 (two) times daily.    docusate sodium (COLACE) 100 MG capsule Take 100 mg by mouth daily.  escitalopram (LEXAPRO) 10 MG tablet Take 10 mg by mouth daily before breakfast.     ezetimibe (ZETIA) 10 MG tablet Take 10 mg by mouth daily.    ketoconazole (NIZORAL) 2 % cream Apply 1 application topically 2 (two) times daily.     lamoTRIgine (LAMICTAL) 25 MG tablet Take 50 mg by mouth 2 (two) times daily.     levothyroxine (SYNTHROID, LEVOTHROID) 100 MCG tablet Take 100 mcg by mouth daily before breakfast.     mineral oil liquid Place into the right ear once a week. Instill 2 drops into both ears once weekly     Omega-3 Fatty Acids 1000 MG CPDR Take 4,000 mg by mouth daily.     polyethylene glycol (MIRALAX / GLYCOLAX) packet Take 17 g by mouth daily.    senna (SENOKOT) 8.6 MG tablet Take 1 tablet by mouth daily.       No Known Allergies    The results of significant diagnostics from this hospitalization (including imaging, microbiology, ancillary and laboratory) are listed below for reference.    Significant Diagnostic Studies: Dg Chest 2 View  03/27/2015   CLINICAL DATA:  Tremors, onset at noon today. Weakness, fever and chills.  EXAM: CHEST  2 VIEW  COMPARISON:  11/17/2014  FINDINGS: There is mild linear atelectasis or infiltrate in the left lung base as well as a probable small left effusion. The right lung is clear. Hilar, mediastinal and cardiac contours are unremarkable and unchanged.  IMPRESSION: Mild left base linear opacities and probable small effusion, possibly due to pneumonia. Followup PA and lateral chest X-ray is recommended in 3-4 weeks to ensure resolution and exclude underlying malignancy.   Electronically Signed   By: Andreas Newport M.D.   On: 03/27/2015 17:44   Dg Abd 1 View  03/27/2015   CLINICAL DATA:  Abdominal distention and fever. Weakness. Chills. Non-Hodgkin's lymphoma  EXAM: ABDOMEN - 1 VIEW  COMPARISON:  None.  FINDINGS: No evidence of dilated bowel loops. A large stool burden is seen throughout the majority of the colon. No radiopaque calculi or signs of abnormal mass effect identified.  IMPRESSION: No acute findings.  Large stool burden noted; suggest clinical correlation for possible constipation.   Electronically Signed   By: Earle Gell M.D.   On: 03/27/2015 17:47    Microbiology: Recent Results (from the past 240 hour(s))  Urine culture     Status: None   Collection Time: 03/27/15  5:49 PM  Result Value Ref Range Status   Specimen Description URINE, RANDOM  Final   Special Requests NONE  Final   Colony Count NO GROWTH Performed at [facility]    Final   Culture NO GROWTH Performed at [facility]   Final   Report Status 03/29/2015 FINAL  Final  Clostridium Difficile by PCR     Status: None   Collection Time: 03/30/15 11:49 AM  Result Value Ref Range Status   C difficile by pcr NEGATIVE NEGATIVE Final     Labs: Basic Metabolic Panel:  Recent Labs Lab 03/27/15 1620 03/27/15 1649 03/27/15 2213 03/28/15 0410 03/30/15 0538 03/31/15 0535  NA 140 141  --  140 146* 143  K 4.6 4.4  --  4.3 4.3 4.5  CL 106 105  --  106 113* 108  CO2 26  --   --  24 25 24   GLUCOSE 138* 137*  --  165* 137* 137*  BUN 28* 28*  --  26* 19 17  CREATININE  2.05* 1.90* 2.15* 2.12* 1.72* 1.82*  CALCIUM 9.7  --   --  8.8* 8.9 8.8*   Liver Function Tests:  Recent Labs Lab 03/27/15 1620 03/28/15 0410  AST 20 20  ALT 17 14*  ALKPHOS 76 59  BILITOT 0.7 0.8  PROT 6.9 5.9*  ALBUMIN 4.4 3.5    Recent Labs Lab 03/27/15 1620  LIPASE 25   No results for input(s): AMMONIA in the last 168 hours. CBC:  Recent Labs Lab 03/27/15 1620 03/27/15 1649 03/27/15 2213 03/28/15 0410 03/29/15 0355  WBC 15.7*  --  22.7* 23.5* 12.0*  NEUTROABS 14.1*  --   --  21.8*  --   HGB 14.4 15.6 12.9* 13.0 11.5*  HCT 44.6 46.0 39.5 39.6 35.8*  MCV 93.1  --  93.6 93.2 94.2  PLT 107*  --  126* 117* 112*   Cardiac Enzymes: No results for input(s): CKTOTAL, CKMB, CKMBINDEX, TROPONINI in the last 168 hours. BNP: BNP (last 3 results) No results for input(s): BNP in the last 8760 hours.  ProBNP (last 3 results) No results for input(s): PROBNP in the last 8760 hours.  CBG: No results for input(s): GLUCAP in the last 168 hours.     Signed:  Charlynne Cousins  [facility] 03/31/2015, 8:52 AM

## 2015-03-31 NOTE — Progress Notes (Signed)
Medicare Important Message given? YES  Date Medicare IM given:   515/2016 Medicare IM given by: Jonnie Finner

## 2015-03-31 NOTE — Progress Notes (Signed)
Medicare Important Message given? YES  Date Medicare IM given:03/31/2015 Medicare IM given by: Venita Sheffield RN

## 2015-03-31 NOTE — Progress Notes (Signed)
Attempted to call report to [facility].  Left message on answering machine.

## 2015-03-31 NOTE — Clinical Social Work Maternal (Signed)
CSW received a call that pt was ready for discharge  CSW completed FL2, prepared discharge paperwork, called and spoke with pt's assisted living facility to discuss transportation for pt home  Pt's facility wanted pt to be transported via an ambulance so [facility] was called  No further CSW needs  CSW signing off  .Dede Query, LCSW [facility] Clinical Social Worker - Weekend Coverage cell #: [phone]

## 2015-04-06 ENCOUNTER — Inpatient Hospital Stay (HOSPITAL_COMMUNITY)
Admission: EM | Admit: 2015-04-06 | Discharge: 2015-04-11 | DRG: 603 | Disposition: A | Discharge status: Home / Self Care | Payer: Medicare Other | Attending: Internal Medicine | Admitting: Internal Medicine

## 2015-04-06 ENCOUNTER — Encounter (HOSPITAL_COMMUNITY): Payer: Self-pay | Admitting: *Deleted

## 2015-04-06 DIAGNOSIS — F200 Paranoid schizophrenia: Secondary | ICD-10-CM | POA: Diagnosis present

## 2015-04-06 DIAGNOSIS — F203 Undifferentiated schizophrenia: Secondary | ICD-10-CM

## 2015-04-06 DIAGNOSIS — E038 Other specified hypothyroidism: Secondary | ICD-10-CM | POA: Diagnosis not present

## 2015-04-06 DIAGNOSIS — F209 Schizophrenia, unspecified: Secondary | ICD-10-CM | POA: Diagnosis present

## 2015-04-06 DIAGNOSIS — L0390 Cellulitis, unspecified: Secondary | ICD-10-CM | POA: Insufficient documentation

## 2015-04-06 DIAGNOSIS — Z79899 Other long term (current) drug therapy: Secondary | ICD-10-CM | POA: Diagnosis not present

## 2015-04-06 DIAGNOSIS — Z8701 Personal history of pneumonia (recurrent): Secondary | ICD-10-CM

## 2015-04-06 DIAGNOSIS — E039 Hypothyroidism, unspecified: Secondary | ICD-10-CM | POA: Diagnosis present

## 2015-04-06 DIAGNOSIS — N183 Chronic kidney disease, stage 3 (moderate): Secondary | ICD-10-CM | POA: Diagnosis present

## 2015-04-06 DIAGNOSIS — L03115 Cellulitis of right lower limb: Principal | ICD-10-CM | POA: Diagnosis present

## 2015-04-06 DIAGNOSIS — I4581 Long QT syndrome: Secondary | ICD-10-CM | POA: Diagnosis present

## 2015-04-06 DIAGNOSIS — Z7982 Long term (current) use of aspirin: Secondary | ICD-10-CM

## 2015-04-06 DIAGNOSIS — N189 Chronic kidney disease, unspecified: Secondary | ICD-10-CM

## 2015-04-06 DIAGNOSIS — E785 Hyperlipidemia, unspecified: Secondary | ICD-10-CM | POA: Diagnosis present

## 2015-04-06 DIAGNOSIS — Z8572 Personal history of non-Hodgkin lymphomas: Secondary | ICD-10-CM | POA: Diagnosis not present

## 2015-04-06 DIAGNOSIS — M7989 Other specified soft tissue disorders: Secondary | ICD-10-CM | POA: Diagnosis not present

## 2015-04-06 DIAGNOSIS — N184 Chronic kidney disease, stage 4 (severe): Secondary | ICD-10-CM | POA: Diagnosis not present

## 2015-04-06 DIAGNOSIS — C9111 Chronic lymphocytic leukemia of B-cell type in remission: Secondary | ICD-10-CM | POA: Diagnosis present

## 2015-04-06 LAB — BASIC METABOLIC PANEL
ANION GAP: 10 (ref 5–15)
BUN: 18 mg/dL (ref 6–20)
CALCIUM: 9.3 mg/dL (ref 8.9–10.3)
CHLORIDE: 106 mmol/L (ref 101–111)
CO2: 25 mmol/L (ref 22–32)
Creatinine, Ser: 1.88 mg/dL — ABNORMAL HIGH (ref 0.61–1.24)
GFR, EST AFRICAN AMERICAN: 43 mL/min — AB (ref >60)
GFR, EST NON AFRICAN AMERICAN: 37 mL/min — AB (ref >60)
Glucose, Bld: 100 mg/dL — ABNORMAL HIGH (ref 65–99)
POTASSIUM: 4.7 mmol/L (ref 3.5–5.1)
SODIUM: 141 mmol/L (ref 135–145)

## 2015-04-06 LAB — CBC WITH DIFFERENTIAL/PLATELET
BASOS ABS: 0 10*3/uL (ref 0.0–0.1)
Basophils Relative: 0 % (ref 0–1)
Eosinophils Absolute: 0.5 10*3/uL (ref 0.0–0.7)
Eosinophils Relative: 5 % (ref 0–5)
HEMATOCRIT: 39 % (ref 39.0–52.0)
HEMOGLOBIN: 12.7 g/dL — AB (ref 13.0–17.0)
Lymphocytes Relative: 19 % (ref 12–46)
Lymphs Abs: 2 10*3/uL (ref 0.7–4.0)
MCH: 30.3 pg (ref 26.0–34.0)
MCHC: 32.6 g/dL (ref 30.0–36.0)
MCV: 93.1 fL (ref 78.0–100.0)
Monocytes Absolute: 0.7 10*3/uL (ref 0.1–1.0)
Monocytes Relative: 7 % (ref 3–12)
NEUTROS ABS: 7 10*3/uL (ref 1.7–7.7)
Neutrophils Relative %: 69 % (ref 43–77)
Platelets: 194 10*3/uL (ref 150–400)
RBC: 4.19 MIL/uL — ABNORMAL LOW (ref 4.22–5.81)
RDW: 13.9 % (ref 11.5–15.5)
WBC: 10.2 10*3/uL (ref 4.0–10.5)

## 2015-04-06 LAB — I-STAT CG4 LACTIC ACID, ED: LACTIC ACID, VENOUS: 1.53 mmol/L (ref 0.5–2.0)

## 2015-04-06 MED ORDER — ACETAMINOPHEN 325 MG PO TABS
650.0000 mg | ORAL_TABLET | Freq: Four times a day (QID) | ORAL | Status: DC | PRN
Start: 2015-04-06 — End: 2015-04-11
  Administered 2015-04-06 – 2015-04-09 (×3): 650 mg via ORAL
  Filled 2015-04-06 (×3): qty 2

## 2015-04-06 MED ORDER — ENOXAPARIN SODIUM 40 MG/0.4ML ~~LOC~~ SOLN
40.0000 mg | SUBCUTANEOUS | Status: DC
  Administered 2015-04-06 – 2015-04-10 (×5): 40 mg via SUBCUTANEOUS
  Filled 2015-04-06 (×6): qty 0.4

## 2015-04-06 MED ORDER — ACETAMINOPHEN 650 MG RE SUPP: 650.0000 mg | Freq: Four times a day (QID) | RECTAL | Status: DC | PRN

## 2015-04-06 MED ORDER — EZETIMIBE 10 MG PO TABS
10.0000 mg | ORAL_TABLET | Freq: Every day | ORAL | Status: DC
  Administered 2015-04-07 – 2015-04-11 (×5): 10 mg via ORAL
  Filled 2015-04-06 (×5): qty 1

## 2015-04-06 MED ORDER — CLOZAPINE 100 MG PO TABS
200.0000 mg | ORAL_TABLET | Freq: Two times a day (BID) | ORAL | Status: DC
  Administered 2015-04-06 – 2015-04-10 (×8): 200 mg via ORAL
  Filled 2015-04-06 (×9): qty 2

## 2015-04-06 MED ORDER — DOCUSATE SODIUM 100 MG PO CAPS
100.0000 mg | ORAL_CAPSULE | Freq: Every day | ORAL | Status: DC
  Administered 2015-04-07 – 2015-04-10 (×4): 100 mg via ORAL

## 2015-04-06 MED ORDER — SODIUM CHLORIDE 0.9 % IV SOLN
Freq: Once | INTRAVENOUS | Status: AC
  Administered 2015-04-06: 11:00:00 via INTRAVENOUS

## 2015-04-06 MED ORDER — ESCITALOPRAM OXALATE 10 MG PO TABS
10.0000 mg | ORAL_TABLET | Freq: Every day | ORAL | Status: DC
  Administered 2015-04-07 – 2015-04-11 (×5): 10 mg via ORAL
  Filled 2015-04-06 (×6): qty 1

## 2015-04-06 MED ORDER — VANCOMYCIN HCL 10 G IV SOLR
1500.0000 mg | INTRAVENOUS | Status: DC
  Administered 2015-04-07: 1500 mg via INTRAVENOUS
  Filled 2015-04-06: qty 1500

## 2015-04-06 MED ORDER — SODIUM CHLORIDE 0.9 % IV SOLN
INTRAVENOUS | Status: DC
  Administered 2015-04-06: 75 mL/h via INTRAVENOUS

## 2015-04-06 MED ORDER — LEVOTHYROXINE SODIUM 100 MCG PO TABS
100.0000 ug | ORAL_TABLET | Freq: Every day | ORAL | Status: DC
  Administered 2015-04-07 – 2015-04-11 (×5): 100 ug via ORAL
  Filled 2015-04-06 (×6): qty 1

## 2015-04-06 MED ORDER — ONDANSETRON HCL 4 MG PO TABS: 4.0000 mg | ORAL_TABLET | Freq: Four times a day (QID) | ORAL | Status: DC | PRN

## 2015-04-06 MED ORDER — MORPHINE SULFATE 2 MG/ML IJ SOLN: 2.0000 mg | INTRAMUSCULAR | Status: DC | PRN

## 2015-04-06 MED ORDER — LAMOTRIGINE 25 MG PO TABS
50.0000 mg | ORAL_TABLET | Freq: Two times a day (BID) | ORAL | Status: DC
  Administered 2015-04-06 – 2015-04-11 (×10): 50 mg via ORAL
  Filled 2015-04-06 (×11): qty 2

## 2015-04-06 MED ORDER — CHLORPROMAZINE HCL 50 MG PO TABS
50.0000 mg | ORAL_TABLET | Freq: Two times a day (BID) | ORAL | Status: DC
  Administered 2015-04-06 – 2015-04-10 (×8): 50 mg via ORAL
  Filled 2015-04-06 (×9): qty 1

## 2015-04-06 MED ORDER — ONDANSETRON HCL 4 MG/2ML IJ SOLN: 4.0000 mg | Freq: Four times a day (QID) | INTRAMUSCULAR | Status: DC | PRN

## 2015-04-06 MED ORDER — POLYETHYLENE GLYCOL 3350 17 G PO PACK
17.0000 g | PACK | Freq: Every day | ORAL | Status: DC
  Administered 2015-04-08 – 2015-04-11 (×4): 17 g via ORAL

## 2015-04-06 MED ORDER — SODIUM CHLORIDE 0.9 % IV SOLN
2000.0000 mg | Freq: Once | INTRAVENOUS | Status: AC
  Administered 2015-04-06: 2000 mg via INTRAVENOUS
  Filled 2015-04-06: qty 2000

## 2015-04-06 MED ORDER — BENZTROPINE MESYLATE 0.5 MG PO TABS
0.5000 mg | ORAL_TABLET | Freq: Every day | ORAL | Status: DC
  Administered 2015-04-07 – 2015-04-11 (×5): 0.5 mg via ORAL
  Filled 2015-04-06 (×5): qty 1

## 2015-04-06 MED ORDER — ASPIRIN EC 81 MG PO TBEC
81.0000 mg | DELAYED_RELEASE_TABLET | Freq: Every day | ORAL | Status: DC
  Administered 2015-04-06 – 2015-04-11 (×6): 81 mg via ORAL
  Filled 2015-04-06 (×6): qty 1

## 2015-04-06 NOTE — ED Notes (Addendum)
One set of cultures obtained due to pt being very difficult stick. Dr Reather Converse notified.

## 2015-04-06 NOTE — ED Provider Notes (Signed)
CSN: [id number]     Arrival date & time 04/06/15  [id number] History   First MD Initiated Contact with Patient 04/06/15 [phone]     Chief Complaint  Patient presents with  . Cellulitis     (Consider location/radiation/quality/duration/timing/severity/associated sxs/prior Treatment) HPI Comments: 64 year old male with history of pneumonia, chronic kidney disease, schizophrenia, chronic leukemia, sepsis presents with worsening spreading redness to his right lower extremity. Patient was recently admitted for pneumonia and IV antibiotics, sent home on Bactrim however he feels the redness is worsening. Patient denies fevers or chills. No blood clot history. Mild worsening swelling in the right lower extremity, mild tender to palpation. Nonsmoker  The history is provided by the patient and medical records.    Past Medical History  Diagnosis Date  . Chronic kidney disease 09/20/2008  . Paranoid schizophrenia   . Hiatal hernia   . NHL (non-Hodgkin's lymphoma) dx'd 08/2008 rt groin    chemo comp 08/2008  . CLL (chronic lymphoblastic leukemia) 10/2003  . cll dx'd 10/2003    no rx  . Non Hodgkin's lymphoma 08/2008  . GERD (gastroesophageal reflux disease)    History reviewed. No pertinent past surgical history. Family History  Problem Relation Age of Onset  . Alcohol abuse Father    History  Substance Use Topics  . Smoking status: Never Smoker   . Smokeless tobacco: Not on file  . Alcohol Use: No    Review of Systems  Constitutional: Positive for appetite change. Negative for fever and chills.  HENT: Negative for congestion.   Eyes: Negative for visual disturbance.  Respiratory: Negative for shortness of breath.   Cardiovascular: Positive for leg swelling. Negative for chest pain.  Gastrointestinal: Negative for vomiting and abdominal pain.  Genitourinary: Negative for dysuria and flank pain.  Musculoskeletal: Negative for back pain, neck pain and neck stiffness.  Skin: Positive for  rash.  Neurological: Negative for light-headedness and headaches.      Allergies  Review of patient's allergies indicates no known allergies.  Home Medications   Prior to Admission medications   Medication Sig Start Date End Date Taking? Authorizing Provider  aspirin EC 81 MG tablet Take 81 mg by mouth daily.   Yes Historical Provider, MD  benztropine (COGENTIN) 0.5 MG tablet Take 0.5 mg by mouth daily.    Yes Historical Provider, MD  chlorproMAZINE (THORAZINE) 50 MG tablet Take 50 mg by mouth 2 (two) times daily.   Yes Historical Provider, MD  cholecalciferol (VITAMIN D) 1000 UNITS tablet Take 1,000 Units by mouth daily.   Yes Historical Provider, MD  clozapine (CLOZARIL) 200 MG tablet Take 200 mg by mouth 2 (two) times daily.   Yes Historical Provider, MD  docusate sodium (COLACE) 100 MG capsule Take 100 mg by mouth daily.   Yes Historical Provider, MD  escitalopram (LEXAPRO) 10 MG tablet Take 10 mg by mouth daily before breakfast.    Yes Historical Provider, MD  ezetimibe (ZETIA) 10 MG tablet Take 10 mg by mouth daily.   Yes Historical Provider, MD  ketoconazole (NIZORAL) 2 % cream Apply 1 application topically 2 (two) times daily.    Yes Historical Provider, MD  lamoTRIgine (LAMICTAL) 25 MG tablet Take 50 mg by mouth 2 (two) times daily.    Yes Historical Provider, MD  levothyroxine (SYNTHROID, LEVOTHROID) 100 MCG tablet Take 100 mcg by mouth daily before breakfast.    Yes Historical Provider, MD  mineral oil liquid Place into the right ear once a week. Instill 2 drops  into both ears once weekly   Yes Historical Provider, MD  Omega-3 Fatty Acids 1000 MG CPDR Take 4,000 mg by mouth daily.    Yes Historical Provider, MD  polyethylene glycol (MIRALAX / GLYCOLAX) packet Take 17 g by mouth daily.   Yes Historical Provider, MD  senna (SENOKOT) 8.6 MG tablet Take 1 tablet by mouth daily.   Yes Historical Provider, MD  sulfamethoxazole-trimethoprim (BACTRIM DS,SEPTRA DS) 800-160 MG per tablet  Take 1 tablet by mouth every 12 (twelve) hours. 03/31/15  Yes Charlynne Cousins, MD   BP 124/54 mmHg  Pulse 83  Temp(Src) 98.3 F (36.8 C) (Oral)  Resp 19  SpO2 96% Physical Exam  Constitutional: He is oriented to person, place, and time. He appears well-developed and well-nourished.  HENT:  Head: Normocephalic and atraumatic.  Mild dry mucous membranes  Eyes: Right eye exhibits no discharge. Left eye exhibits no discharge.  Neck: Normal range of motion. Neck supple. No tracheal deviation present.  Cardiovascular: Normal rate and regular rhythm.   Pulmonary/Chest: Effort normal and breath sounds normal.  Abdominal: Soft. He exhibits no distension. There is no tenderness. There is no guarding.  Musculoskeletal: He exhibits edema and tenderness.  Neurological: He is alert and oriented to person, place, and time.  Skin: Skin is warm. No rash noted.  Patient has significant erythema and warmth to the right lower extremity worse anteriorly, no crepitus, mild tender anteriorly, 1+ edema right lower extremity, superficial abrasion between first and second toes.  Psychiatric: He has a normal mood and affect.  Nursing note and vitals reviewed.   ED Course  Procedures (including critical care time) Labs Review Labs Reviewed  BASIC METABOLIC PANEL - Abnormal; Notable for the following:    Glucose, Bld 100 (*)    Creatinine, Ser 1.88 (*)    GFR calc non Af Amer 37 (*)    GFR calc Af Amer 43 (*)    All other components within normal limits  CBC WITH DIFFERENTIAL/PLATELET - Abnormal; Notable for the following:    RBC 4.19 (*)    Hemoglobin 12.7 (*)    All other components within normal limits  CULTURE, BLOOD (SINGLE)  I-STAT CG4 LACTIC ACID, ED    Imaging Review No results found.   EKG Interpretation None      MDM   Final diagnoses:  Cellulitis of right lower extremity  CRF (chronic renal failure), unspecified stage   Clinical concern for worsening despite being on  Bactrim. Plan for cultures IV antibiotics and admission to the hospital. Vancomycin ordered  The patients results and plan were reviewed and discussed.   Any x-rays performed were personally reviewed by myself.   Differential diagnosis were considered with the presenting HPI.  Medications  vancomycin (VANCOCIN) 2,000 mg in sodium chloride 0.9 % 500 mL IVPB (2,000 mg Intravenous New Bag/Given 04/06/15 1106)  0.9 %  sodium chloride infusion ( Intravenous New Bag/Given 04/06/15 1106)    Filed Vitals:   04/06/15 0948 04/06/15 0950  BP: 124/54   Pulse: 83   Temp: 98.3 F (36.8 C)   TempSrc: Oral   Resp: 19   SpO2: 96% 96%    Final diagnoses:  Cellulitis of right lower extremity  CRF (chronic renal failure), unspecified stage    Admission/ observation were discussed with the admitting physician, patient and/or family and they are comfortable with the plan.    Elnora Morrison, MD 04/06/15 [phone]

## 2015-04-06 NOTE — ED Notes (Signed)
Called lab to inquire about results, per Billy CBC, BMP and one set of cultures are in process.

## 2015-04-06 NOTE — ED Notes (Signed)
Bed: WA21 Expected date: 04/06/15 Expected time: 9:45 AM Means of arrival: Ambulance Comments: Infected foot

## 2015-04-06 NOTE — H&P (Signed)
PCP:   No primary care provider on file.   Chief Complaint:  Right leg swelling and redness  HPI: 64 year old male who  has a past medical history of Chronic kidney disease (09/20/2008); Paranoid schizophrenia; Hiatal hernia; NHL (non-Hodgkin's lymphoma) (dx'd 08/2008 rt groin); CLL (chronic lymphoblastic leukemia) (10/2003); cll (dx'd 10/2003); Non Hodgkin's lymphoma (08/2008); and GERD (gastroesophageal reflux disease). Today presents to the hospital with chief complaint of redness and swelling of the right lower extremity. Patient was recently discharged on Bactrim for right leg cellulitis on 03/31/2015. Patient took Bactrim for 5 days but today came back to the hospital with the same redness, he denies any pain though it hurts when he walks. In the ED lab work reveals a normal white count, 10.2. He denies chest pain, no shortness of breath, he denies diarrhea but had nausea vomiting this morning   Allergies:  No Known Allergies    Past Medical History  Diagnosis Date  . Chronic kidney disease 09/20/2008  . Paranoid schizophrenia   . Hiatal hernia   . NHL (non-Hodgkin's lymphoma) dx'd 08/2008 rt groin    chemo comp 08/2008  . CLL (chronic lymphoblastic leukemia) 10/2003  . cll dx'd 10/2003    no rx  . Non Hodgkin's lymphoma 08/2008  . GERD (gastroesophageal reflux disease)     History reviewed. No pertinent past surgical history.  Prior to Admission medications   Medication Sig Start Date End Date Taking? Authorizing Provider  aspirin EC 81 MG tablet Take 81 mg by mouth daily.   Yes Historical Provider, MD  benztropine (COGENTIN) 0.5 MG tablet Take 0.5 mg by mouth daily.    Yes Historical Provider, MD  chlorproMAZINE (THORAZINE) 50 MG tablet Take 50 mg by mouth 2 (two) times daily.   Yes Historical Provider, MD  cholecalciferol (VITAMIN D) 1000 UNITS tablet Take 1,000 Units by mouth daily.   Yes Historical Provider, MD  clozapine (CLOZARIL) 200 MG tablet Take 200 mg by  mouth 2 (two) times daily.   Yes Historical Provider, MD  docusate sodium (COLACE) 100 MG capsule Take 100 mg by mouth daily.   Yes Historical Provider, MD  escitalopram (LEXAPRO) 10 MG tablet Take 10 mg by mouth daily before breakfast.    Yes Historical Provider, MD  ezetimibe (ZETIA) 10 MG tablet Take 10 mg by mouth daily.   Yes Historical Provider, MD  ketoconazole (NIZORAL) 2 % cream Apply 1 application topically 2 (two) times daily.    Yes Historical Provider, MD  lamoTRIgine (LAMICTAL) 25 MG tablet Take 50 mg by mouth 2 (two) times daily.    Yes Historical Provider, MD  levothyroxine (SYNTHROID, LEVOTHROID) 100 MCG tablet Take 100 mcg by mouth daily before breakfast.    Yes Historical Provider, MD  mineral oil liquid Place into the right ear once a week. Instill 2 drops into both ears once weekly   Yes Historical Provider, MD  Omega-3 Fatty Acids 1000 MG CPDR Take 4,000 mg by mouth daily.    Yes Historical Provider, MD  polyethylene glycol (MIRALAX / GLYCOLAX) packet Take 17 g by mouth daily.   Yes Historical Provider, MD  senna (SENOKOT) 8.6 MG tablet Take 1 tablet by mouth daily.   Yes Historical Provider, MD  sulfamethoxazole-trimethoprim (BACTRIM DS,SEPTRA DS) 800-160 MG per tablet Take 1 tablet by mouth every 12 (twelve) hours. 03/31/15  Yes Charlynne Cousins, MD    Social History:  reports that he has never smoked. He does not have any smokeless tobacco  history on file. He reports that he does not drink alcohol or use illicit drugs.  Family History  Problem Relation Age of Onset  . Alcohol abuse Father      All the positives are listed in BOLD  Review of Systems:  HEENT: Headache, blurred vision, runny nose, sore throat Neck: Hypothyroidism, hyperthyroidism,,lymphadenopathy Chest : Shortness of breath, history of COPD, Asthma Heart : Chest pain, history of coronary arterey disease GI:  Nausea, vomiting, diarrhea, constipation, GERD GU: Dysuria, urgency, frequency of  urination, hematuria Neuro: Stroke, seizures, syncope Psych: Depression, anxiety, hallucinations   Physical Exam: Blood pressure 132/60, pulse 79, temperature 98.2 F (36.8 C), temperature source Oral, resp. rate 20, SpO2 97 %. Constitutional:   Patient is a well-developed and well-nourished male* in no acute distress and cooperative with exam. Head: Normocephalic and atraumatic Mouth: Mucus membranes moist Eyes: PERRL, EOMI, conjunctivae normal Neck: Supple, No Thyromegaly Cardiovascular: RRR, S1 normal, S2 normal Pulmonary/Chest: CTAB, no wheezes, rales, or rhonchi Abdominal: Soft. Non-tender, non-distended, bowel sounds are normal, no masses, organomegaly, or guarding present.  Neurological: A&O x3, Strength is normal and symmetric bilaterally, cranial nerve II-XII are grossly intact, no focal motor deficit, sensory intact to light touch bilaterally.  Extremities : Marked erythema noted below the right knee, warm to touch, mild tenderness to palpation  Labs on Admission:  Basic Metabolic Panel:  Recent Labs Lab 03/31/15 0535 04/06/15 1103  NA 143 141  K 4.5 4.7  CL 108 106  CO2 24 25  GLUCOSE 137* 100*  BUN 17 18  CREATININE 1.82* 1.88*  CALCIUM 8.8* 9.3   Liver Function Tests: No results for input(s): AST, ALT, ALKPHOS, BILITOT, PROT, ALBUMIN in the last 168 hours. No results for input(s): LIPASE, AMYLASE in the last 168 hours. No results for input(s): AMMONIA in the last 168 hours. CBC:  Recent Labs Lab 04/06/15 1103  WBC 10.2  NEUTROABS 7.0  HGB 12.7*  HCT 39.0  MCV 93.1  PLT 194      Assessment/Plan Active Problems:   Schizophrenia   Cellulitis of right lower extremity  Right lower extremity cellulitis Patient has failed outpatient therapy with Bactrim, will restart patient on vancomycin per pharmacy consultation. Blood cultures 2 have been obtained. Follow blood culture results. Patient may need Zyvox at the time of  discharge.  Schizophrenia Stable, continue antipsychotic medications  CKD stage III Patient's creatinine today 1.88, close to baseline.  Chronic lymphocytic leukemia Currently in remission  DVT prophylaxis Lovenox  Code status: Full code  Family discussion no family at bedside  Time Spent on Admiss55 minutes [facility] Pager: [phone] 04/06/2015, 12:11 PM  If 7PM-7AM, please contact night-coverage  [email or web address]  Password [email or web address]

## 2015-04-06 NOTE — ED Notes (Signed)
Attempted to call report x2, receiving nurse who is charge nurse today  is unavailable.

## 2015-04-06 NOTE — ED Notes (Signed)
Per [facility] pt coming from [facility] on [address] c/o R leg cellulitis; pt was recently discharged from hospital where he underwent IV abx treatment for same and was discharged with PO abx.

## 2015-04-06 NOTE — Progress Notes (Signed)
ANTIBIOTIC CONSULT NOTE - INITIAL  Pharmacy Consult for vancomycin Indication: cellulitis  No Known Allergies  Patient Measurements:   Adjusted Body Weight:  Vital Signs: Temp: 98.2 F (36.8 C) (05/21 1245) Temp Source: Oral (05/21 1245) BP: 122/60 mmHg (05/21 1245) Pulse Rate: 73 (05/21 1245) Intake/Output from previous day:   Intake/Output from this shift:    Labs:  Recent Labs  04/06/15 1103  WBC 10.2  HGB 12.7*  PLT 194  CREATININE 1.88*   Estimated Creatinine Clearance: 58.5 mL/min (by C-G formula based on Cr of 1.88). No results for input(s): VANCOTROUGH, VANCOPEAK, VANCORANDOM, GENTTROUGH, GENTPEAK, GENTRANDOM, TOBRATROUGH, TOBRAPEAK, TOBRARND, AMIKACINPEAK, AMIKACINTROU, AMIKACIN in the last 72 hours.   Microbiology: Recent Results (from the past 720 hour(s))  Urine culture     Status: None   Collection Time: 03/27/15  5:49 PM  Result Value Ref Range Status   Specimen Description URINE, RANDOM  Final   Special Requests NONE  Final   Colony Count NO GROWTH Performed at [facility]   Final   Culture NO GROWTH Performed at [facility]   Final   Report Status 03/29/2015 FINAL  Final  Clostridium Difficile by PCR     Status: None   Collection Time: 03/30/15 11:49 AM  Result Value Ref Range Status   C difficile by pcr NEGATIVE NEGATIVE Final    Medical History: Past Medical History  Diagnosis Date  . Chronic kidney disease 09/20/2008  . Paranoid schizophrenia   . Hiatal hernia   . NHL (non-Hodgkin's lymphoma) dx'd 08/2008 rt groin    chemo comp 08/2008  . CLL (chronic lymphoblastic leukemia) 10/2003  . cll dx'd 10/2003    no rx  . Non Hodgkin's lymphoma 08/2008  . GERD (gastroesophageal reflux disease)    Assessment: [name] presents with recurrent cellulitis of RLE.  He was discharged on 5/15 on TMP/SMZ following course of IV vancomycin as inpatient.  Erythema noted to be worsening.     5/21 >> vancomycin  5/21 blood:    SCr elevated (normalized CrCl = 61ml/min) WBC WNL afebrile  Goal of Therapy:  Vancomycin trough level 10-15 mcg/ml  Plan:   Vancomycin 2gm x 1 in ED then vancomycin 1500mg  IV q24h   Check trough at steady state  Follow renal function  Patient may have failed TMP/SMZ as it has poor grp A strep coverage, a common cause of non-purulent cellulitis.  A beta-lactam should have been added to TMP/SMZ for adequate coverage of non-purulent cellulitis (or used clindamycin)  Doreene Eland, PharmD, BCPS.   Pager: [phone]  04/06/2015,1:19 PM

## 2015-04-06 NOTE — ED Notes (Signed)
Attempted to start an PIV and draw blood x 2, no success. Charge RN to assess.

## 2015-04-07 DIAGNOSIS — E038 Other specified hypothyroidism: Secondary | ICD-10-CM

## 2015-04-07 LAB — CBC
HCT: 36.8 % — ABNORMAL LOW (ref 39.0–52.0)
HEMOGLOBIN: 11.5 g/dL — AB (ref 13.0–17.0)
MCH: 29.4 pg (ref 26.0–34.0)
MCHC: 31.3 g/dL (ref 30.0–36.0)
MCV: 94.1 fL (ref 78.0–100.0)
PLATELETS: 175 10*3/uL (ref 150–400)
RBC: 3.91 MIL/uL — ABNORMAL LOW (ref 4.22–5.81)
RDW: 13.9 % (ref 11.5–15.5)
WBC: 8.4 10*3/uL (ref 4.0–10.5)

## 2015-04-07 LAB — COMPREHENSIVE METABOLIC PANEL
ALBUMIN: 3 g/dL — AB (ref 3.5–5.0)
ALT: 98 U/L — ABNORMAL HIGH (ref 17–63)
AST: 33 U/L (ref 15–41)
Alkaline Phosphatase: 160 U/L — ABNORMAL HIGH (ref 38–126)
Anion gap: 5 (ref 5–15)
BILIRUBIN TOTAL: 0.4 mg/dL (ref 0.3–1.2)
BUN: 17 mg/dL (ref 6–20)
CO2: 24 mmol/L (ref 22–32)
CREATININE: 1.83 mg/dL — AB (ref 0.61–1.24)
Calcium: 8.6 mg/dL — ABNORMAL LOW (ref 8.9–10.3)
Chloride: 114 mmol/L — ABNORMAL HIGH (ref 101–111)
GFR calc Af Amer: 44 mL/min — ABNORMAL LOW (ref >60)
GFR calc non Af Amer: 38 mL/min — ABNORMAL LOW (ref >60)
GLUCOSE: 125 mg/dL — AB (ref 65–99)
Potassium: 4.4 mmol/L (ref 3.5–5.1)
Sodium: 143 mmol/L (ref 135–145)
Total Protein: 5.4 g/dL — ABNORMAL LOW (ref 6.5–8.1)

## 2015-04-07 MED ORDER — CEFAZOLIN SODIUM-DEXTROSE 2-3 GM-% IV SOLR
2.0000 g | Freq: Three times a day (TID) | INTRAVENOUS | Status: DC
  Administered 2015-04-07 – 2015-04-10 (×10): 2 g via INTRAVENOUS
  Filled 2015-04-07 (×13): qty 50

## 2015-04-07 NOTE — Progress Notes (Signed)
Pt has been restless,impulsively getting oob by himself.Bed and chair alarms used & constantly alarming and having to be re-set and pt reminded not to get oob by himself and is high fall risk.all fall prevention measures in place.denies pain at this time Tylenol 2 po given 2045 for general discomfort.Aggie Moats D

## 2015-04-07 NOTE — Progress Notes (Signed)
PATIENT DETAILS Name: [name] Age: 64 y.o. Sex: male Date of Birth: July 30, 1951 Admit Date: 04/06/2015 Admitting Physician Oswald Hillock, MD PCP:No primary care provider on file.  Subjective: Continues to have right lower extremity erythema, mild swelling. Weeping serous fluid from right calf area.  Assessment/Plan: Active Problems:   Right lower extremity cellulitis: Recently discharged on Bactrim-was on IV Rocephin and vancomycin during his recent hospitalization. Right lower extremity remains erythematous, areas of skin breakdown evident in his foot. Some areas of skin breakdown evident in posterior calf area with drainage of serosanguineous liquid. Doubt MRSA infection, will change to Ancef. Check Dopplers and MRI negative-although I doubt any deep infection. Note leg is not tense, no crepitus. He needs a wound care evaluation-have ordered. Keep leg elevated at all times.    History of CLL and non-Hodgkin's lymphoma: In remission. Reviewed most recent outpatient progress note from oncology. Follow CBC periodically.    History of chronic kidney disease stage III: Creatinine close to usual baseline. Follow electrolytes closely.    History of schizophrenia: Continue Cogentin, Thorazine, clozapine, Lexapro, Lamictal. Apparently lives in a group home.    Hypothyroidism: Continue with levothyroxine  Disposition: Remain inpatient  Antimicrobial agents  See below  Anti-infectives    Start     Dose/Rate Route Frequency Ordered Stop   04/07/15 1400  ceFAZolin (ANCEF) IVPB 2 g/50 mL premix     2 g 100 mL/hr over 30 Minutes Intravenous 3 times per day 04/07/15 1118     04/07/15 0600  vancomycin (VANCOCIN) 1,500 mg in sodium chloride 0.9 % 500 mL IVPB  Status:  Discontinued     1,500 mg 250 mL/hr over 120 Minutes Intravenous Every 24 hours 04/06/15 1332 04/07/15 1102   04/06/15 1015  vancomycin (VANCOCIN) 2,000 mg in sodium chloride 0.9 % 500 mL IVPB     2,000  mg 250 mL/hr over 120 Minutes Intravenous  Once 04/06/15 1000 04/06/15 1306      DVT Prophylaxis: Prophylactic Lovenox   Code Status: Full code  Family Communication None at bedside  Procedures: None  CONSULTS:  None  Time spent 25 minutes-Greater than 50% of this time was spent in counseling, explanation of diagnosis, planning of further management, and coordination of care.  MEDICATIONS: Scheduled Meds: . aspirin EC  81 mg Oral Daily  . benztropine  0.5 mg Oral Daily  .  ceFAZolin (ANCEF) IV  2 g Intravenous 3 times per day  . chlorproMAZINE  50 mg Oral BID  . clozapine  200 mg Oral BID  . docusate sodium  100 mg Oral Daily  . enoxaparin (LOVENOX) injection  40 mg Subcutaneous Q24H  . escitalopram  10 mg Oral QAC breakfast  . ezetimibe  10 mg Oral Daily  . lamoTRIgine  50 mg Oral BID  . levothyroxine  100 mcg Oral QAC breakfast  . polyethylene glycol  17 g Oral Daily   Continuous Infusions:  PRN Meds:.acetaminophen **OR** acetaminophen, morphine injection, ondansetron **OR** ondansetron (ZOFRAN) IV    PHYSICAL EXAM: Vital signs in last 24 hours: Filed Vitals:   04/06/15 1424 04/06/15 2041 04/07/15 0432 04/07/15 1335  BP: 126/61 95/47 109/52 124/65  Pulse: 84 90 80 83  Temp: 98.3 F (36.8 C) 98.5 F (36.9 C) 97.9 F (36.6 C) 98.3 F (36.8 C)  TempSrc: Oral Oral Axillary Oral  Resp: 20 18 18 18   Height:  Weight:      SpO2: 99% 94% 96% 96%    Weight change:  Filed Weights   04/06/15 1245  Weight: 130.636 kg (288 lb)   Body mass index is 38.01 kg/(m^2).   Gen Exam: Awake and alert with clear speech.   Neck: Supple, No JVD.   Chest: B/L Clear.   CVS: S1 S2 Regular, no murmurs.  Abdomen: soft, BS +, non tender, non distended.  Extremities: no edema, lower extremities warm to touch.See below pic re right leg Neurologic: Non Focal.   Skin: No Rash.   Wounds: N/A.   Right Leg   Right leg   Right calf  Intake/Output from previous  day:  Intake/Output Summary (Last 24 hours) at 04/07/15 1351 Last data filed at 04/07/15 1336  Gross per 24 hour  Intake 3682.92 ml  Output   1550 ml  Net 2132.92 ml     LAB RESULTS: CBC  Recent Labs Lab 04/06/15 1103 04/07/15 0430  WBC 10.2 8.4  HGB 12.7* 11.5*  HCT 39.0 36.8*  PLT 194 175  MCV 93.1 94.1  MCH 30.3 29.4  MCHC 32.6 31.3  RDW 13.9 13.9  LYMPHSABS 2.0  --   MONOABS 0.7  --   EOSABS 0.5  --   BASOSABS 0.0  --     Chemistries   Recent Labs Lab 04/06/15 1103 04/07/15 0430  NA 141 143  K 4.7 4.4  CL 106 114*  CO2 25 24  GLUCOSE 100* 125*  BUN 18 17  CREATININE 1.88* 1.83*  CALCIUM 9.3 8.6*    CBG: No results for input(s): GLUCAP in the last 168 hours.  GFR Estimated Creatinine Clearance: 60.1 mL/min (by C-G formula based on Cr of 1.83).  Coagulation profile No results for input(s): INR, PROTIME in the last 168 hours.  Cardiac Enzymes No results for input(s): CKMB, TROPONINI, MYOGLOBIN in the last 168 hours.  Invalid input(s): CK  Invalid input(s): POCBNP No results for input(s): DDIMER in the last 72 hours. No results for input(s): HGBA1C in the last 72 hours. No results for input(s): CHOL, HDL, LDLCALC, TRIG, CHOLHDL, LDLDIRECT in the last 72 hours. No results for input(s): TSH, T4TOTAL, T3FREE, THYROIDAB in the last 72 hours.  Invalid input(s): FREET3 No results for input(s): VITAMINB12, FOLATE, FERRITIN, TIBC, IRON, RETICCTPCT in the last 72 hours. No results for input(s): LIPASE, AMYLASE in the last 72 hours.  Urine Studies No results for input(s): UHGB, CRYS in the last 72 hours.  Invalid input(s): UACOL, UAPR, USPG, UPH, UTP, UGL, UKET, UBIL, UNIT, UROB, ULEU, UEPI, UWBC, URBC, UBAC, CAST, UCOM, BILUA  MICROBIOLOGY: Recent Results (from the past 240 hour(s))  Clostridium Difficile by PCR     Status: None   Collection Time: 03/30/15 11:49 AM  Result Value Ref Range Status   C difficile by pcr NEGATIVE NEGATIVE Final   Culture, blood (single)     Status: None (Preliminary result)   Collection Time: 04/06/15 10:53 AM  Result Value Ref Range Status   Specimen Description BLOOD RIGHT HAND  Final   Special Requests BOTTLES DRAWN AEROBIC AND ANAEROBIC 5CC EACH  Final   Culture   Final           BLOOD CULTURE RECEIVED NO GROWTH TO DATE CULTURE WILL BE HELD FOR 5 DAYS BEFORE ISSUING A FINAL NEGATIVE REPORT Performed at [facility]    Report Status PENDING  Incomplete    RADIOLOGY STUDIES/RESULTS: Dg Chest 2 View  03/27/2015  CLINICAL DATA:  Tremors, onset at noon today. Weakness, fever and chills.  EXAM: CHEST  2 VIEW  COMPARISON:  11/17/2014  FINDINGS: There is mild linear atelectasis or infiltrate in the left lung base as well as a probable small left effusion. The right lung is clear. Hilar, mediastinal and cardiac contours are unremarkable and unchanged.  IMPRESSION: Mild left base linear opacities and probable small effusion, possibly due to pneumonia. Followup PA and lateral chest X-ray is recommended in 3-4 weeks to ensure resolution and exclude underlying malignancy.   Electronically Signed   By: Andreas Newport M.D.   On: 03/27/2015 17:44   Dg Abd 1 View  03/27/2015   CLINICAL DATA:  Abdominal distention and fever. Weakness. Chills. Non-Hodgkin's lymphoma  EXAM: ABDOMEN - 1 VIEW  COMPARISON:  None.  FINDINGS: No evidence of dilated bowel loops. A large stool burden is seen throughout the majority of the colon. No radiopaque calculi or signs of abnormal mass effect identified.  IMPRESSION: No acute findings.  Large stool burden noted; suggest clinical correlation for possible constipation.   Electronically Signed   By: Earle Gell M.D.   On: 03/27/2015 17:47    Oren Binet, MD  [facility] Pager:[phone]  If 7PM-7AM, please contact night-coverage [email or web address] Password [email or web address] 04/07/2015, 1:51 PM   LOS: 1 day

## 2015-04-07 NOTE — Progress Notes (Signed)
Utilization review completed.  

## 2015-04-07 NOTE — Progress Notes (Signed)
ANTIBIOTIC CONSULT NOTE - INITIAL  Pharmacy Consult for vancomycin to cefazolin Indication: cellulitis  No Known Allergies  Patient Measurements: Height: 6\' 1"  (185.4 cm) Weight: 288 lb (130.636 kg) IBW/kg (Calculated) : 79.9 Adjusted Body Weight:  Vital Signs: Temp: 97.9 F (36.6 C) (05/22 0432) Temp Source: Axillary (05/22 0432) BP: 109/52 mmHg (05/22 0432) Pulse Rate: 80 (05/22 0432) Intake/Output from previous day: 05/21 0701 - 05/22 0700 In: 3202.9 [P.O.:720; I.V.:1482.9; IV Piggyback:1000] Out: 1200 [Urine:1200] Intake/Output from this shift: Total I/O In: 360 [P.O.:360] Out: -   Labs:  Recent Labs  04/06/15 1103 04/07/15 0430  WBC 10.2 8.4  HGB 12.7* 11.5*  PLT 194 175  CREATININE 1.88* 1.83*   Estimated Creatinine Clearance: 60.1 mL/min (by C-G formula based on Cr of 1.83). No results for input(s): VANCOTROUGH, VANCOPEAK, VANCORANDOM, GENTTROUGH, GENTPEAK, GENTRANDOM, TOBRATROUGH, TOBRAPEAK, TOBRARND, AMIKACINPEAK, AMIKACINTROU, AMIKACIN in the last 72 hours.   Microbiology: Recent Results (from the past 720 hour(s))  Urine culture     Status: None   Collection Time: 03/27/15  5:49 PM  Result Value Ref Range Status   Specimen Description URINE, RANDOM  Final   Special Requests NONE  Final   Colony Count NO GROWTH Performed at [facility]   Final   Culture NO GROWTH Performed at [facility]   Final   Report Status 03/29/2015 FINAL  Final  Clostridium Difficile by PCR     Status: None   Collection Time: 03/30/15 11:49 AM  Result Value Ref Range Status   C difficile by pcr NEGATIVE NEGATIVE Final  Culture, blood (single)     Status: None (Preliminary result)   Collection Time: 04/06/15 10:53 AM  Result Value Ref Range Status   Specimen Description BLOOD RIGHT HAND  Final   Special Requests BOTTLES DRAWN AEROBIC AND ANAEROBIC 5CC EACH  Final   Culture   Final           BLOOD CULTURE RECEIVED NO GROWTH TO DATE CULTURE WILL BE HELD  FOR 5 DAYS BEFORE ISSUING A FINAL NEGATIVE REPORT Performed at [facility]    Report Status PENDING  Incomplete    Medical History: Past Medical History  Diagnosis Date  . Chronic kidney disease 09/20/2008  . Paranoid schizophrenia   . Hiatal hernia   . NHL (non-Hodgkin's lymphoma) dx'd 08/2008 rt groin    chemo comp 08/2008  . CLL (chronic lymphoblastic leukemia) 10/2003  . cll dx'd 10/2003    no rx  . Non Hodgkin's lymphoma 08/2008  . GERD (gastroesophageal reflux disease)    Assessment: 42 YOM presents with recurrent cellulitis of RLE.  He was discharged on 5/15 on TMP/SMZ following course of IV vancomycin as inpatient.  Erythema noted to be worsening.   Vancomycin changed to cefazolin 5/22.   5/21 >> vancomycin >> 5/22 5/22 >> cefazolin >>  5/21 blood x1: NGTD   SCr elevated (normalized CrCl = 81ml/min) WBC WNL Afebrile BMI = 38  Goal of Therapy:  Dose per patient-specific parameters  Plan:   Cefazolin 2gm IV q8h dosed for obesity   No further dose adjustment or monitoring needed at this time  Patient may have failed TMP/SMZ as it has poor grp A strep coverage, a common cause of non-purulent cellulitis.  A beta-lactam should have been added to TMP/SMZ for adequate coverage of non-purulent cellulitis (or used clindamycin)  Doreene Eland, PharmD, BCPS.   Pager: [phone]  04/07/2015,11:11 AM

## 2015-04-08 ENCOUNTER — Inpatient Hospital Stay (HOSPITAL_COMMUNITY): Payer: Medicare Other

## 2015-04-08 DIAGNOSIS — M7989 Other specified soft tissue disorders: Secondary | ICD-10-CM

## 2015-04-08 LAB — BASIC METABOLIC PANEL
ANION GAP: 7 (ref 5–15)
BUN: 19 mg/dL (ref 6–20)
CALCIUM: 8.8 mg/dL — AB (ref 8.9–10.3)
CO2: 25 mmol/L (ref 22–32)
CREATININE: 1.85 mg/dL — AB (ref 0.61–1.24)
Chloride: 109 mmol/L (ref 101–111)
GFR calc Af Amer: 44 mL/min — ABNORMAL LOW (ref >60)
GFR, EST NON AFRICAN AMERICAN: 38 mL/min — AB (ref >60)
GLUCOSE: 121 mg/dL — AB (ref 65–99)
POTASSIUM: 5.1 mmol/L (ref 3.5–5.1)
SODIUM: 141 mmol/L (ref 135–145)

## 2015-04-08 LAB — CBC
HCT: 35.6 % — ABNORMAL LOW (ref 39.0–52.0)
Hemoglobin: 11.9 g/dL — ABNORMAL LOW (ref 13.0–17.0)
MCH: 31.4 pg (ref 26.0–34.0)
MCHC: 33.4 g/dL (ref 30.0–36.0)
MCV: 93.9 fL (ref 78.0–100.0)
Platelets: 170 10*3/uL (ref 150–400)
RBC: 3.79 MIL/uL — ABNORMAL LOW (ref 4.22–5.81)
RDW: 13.8 % (ref 11.5–15.5)
WBC: 9.7 10*3/uL (ref 4.0–10.5)

## 2015-04-08 MED ORDER — LORAZEPAM 2 MG/ML IJ SOLN
1.0000 mg | Freq: Once | INTRAMUSCULAR | Status: AC | PRN
Start: 2015-04-08 — End: 2015-04-08

## 2015-04-08 NOTE — Progress Notes (Signed)
Patient is displaying inappropriate behavior, asking male staff to come in his room, close and door and give him a "hand job". Also asking if they want to see his private parts. Patient was told this behavior is unacceptable and he cannot speak to staff in that manner. His comment was "I would rather talk about it than to keep my mouth shut". DON made aware.

## 2015-04-08 NOTE — Progress Notes (Signed)
PATIENT DETAILS Name: [name] Age: 64 y.o. Sex: male Date of Birth: [birth year] Admit Date: 04/06/2015 Admitting Physician Oswald Hillock, MD PCP:No primary care provider on file.  Subjective: Continues to have right lower extremity erythema, mild swelling.   Assessment/Plan: Active Problems:   Right lower extremity cellulitis: Recently discharged on Bactrim-was on IV Rocephin and vancomycin during his recent hospitalization. Right lower extremity remains erythematous-but somewhat lighter todayDoubt MRSA infection, will change to Ancef. Right lower ext Dopplers negative, MRI pending. Keep leg elevated at all times. Continue IV Ancef. Appreciate wound care evaluation    History of CLL and non-Hodgkin's lymphoma: In remission. Reviewed most recent outpatient progress note from oncology. Follow CBC periodically.    History of chronic kidney disease stage III: Creatinine close to usual baseline. Follow electrolytes closely.    History of schizophrenia: Continue Cogentin, Thorazine, clozapine, Lexapro, Lamictal. Apparently lives in a group home.    Hypothyroidism: Continue with levothyroxine  Disposition: Remain inpatient  Antimicrobial agents  See below  Anti-infectives    Start     Dose/Rate Route Frequency Ordered Stop   04/07/15 1400  ceFAZolin (ANCEF) IVPB 2 g/50 mL premix     2 g 100 mL/hr over 30 Minutes Intravenous 3 times per day 04/07/15 1118     04/07/15 0600  vancomycin (VANCOCIN) 1,500 mg in sodium chloride 0.9 % 500 mL IVPB  Status:  Discontinued     1,500 mg 250 mL/hr over 120 Minutes Intravenous Every 24 hours 04/06/15 1332 04/07/15 1102   04/06/15 1015  vancomycin (VANCOCIN) 2,000 mg in sodium chloride 0.9 % 500 mL IVPB     2,000 mg 250 mL/hr over 120 Minutes Intravenous  Once 04/06/15 1000 04/06/15 1306      DVT Prophylaxis: Prophylactic Lovenox   Code Status: Full code  Family Communication None at  bedside  Procedures: None  CONSULTS:  None  Time spent 25 minutes-Greater than 50% of this time was spent in counseling, explanation of diagnosis, planning of further management, and coordination of care.  MEDICATIONS: Scheduled Meds: . aspirin EC  81 mg Oral Daily  . benztropine  0.5 mg Oral Daily  .  ceFAZolin (ANCEF) IV  2 g Intravenous 3 times per day  . chlorproMAZINE  50 mg Oral BID  . clozapine  200 mg Oral BID  . docusate sodium  100 mg Oral Daily  . enoxaparin (LOVENOX) injection  40 mg Subcutaneous Q24H  . escitalopram  10 mg Oral QAC breakfast  . ezetimibe  10 mg Oral Daily  . lamoTRIgine  50 mg Oral BID  . levothyroxine  100 mcg Oral QAC breakfast  . polyethylene glycol  17 g Oral Daily   Continuous Infusions:  PRN Meds:.acetaminophen **OR** acetaminophen, LORazepam, morphine injection, ondansetron **OR** ondansetron (ZOFRAN) IV    PHYSICAL EXAM: Vital signs in last 24 hours: Filed Vitals:   04/07/15 1335 04/07/15 2039 04/08/15 0624 04/08/15 1357  BP: 124/65 137/69 106/48 129/70  Pulse: 83 77 70 107  Temp: 98.3 F (36.8 C) 98 F (36.7 C) 97.8 F (36.6 C) 98 F (36.7 C)  TempSrc: Oral Oral Axillary Oral  Resp: 18 18 18 18   Height:      Weight:      SpO2: 96% 100% 100% 100%    Weight change:  Filed Weights   04/06/15 1245  Weight: 130.636 kg (288 lb)   Body mass index is  38.01 kg/(m^2).   Gen Exam: Awake and alert with clear speech.   Neck: Supple, No JVD.   Chest: B/L Clear.   CVS: S1 S2 Regular, no murmurs.  Abdomen: soft, BS +, non tender, non distended.  Extremities: no edema, lower extremities warm to touch.See below pic re right leg Neurologic: Non Focal.   Skin: No Rash.   Wounds: N/A.   Right Leg   Right leg     Intake/Output from previous day:  Intake/Output Summary (Last 24 hours) at 04/08/15 1442 Last data filed at 04/08/15 1358  Gross per 24 hour  Intake   1560 ml  Output   3775 ml  Net  -2215 ml     LAB  RESULTS: CBC  Recent Labs Lab 04/06/15 1103 04/07/15 0430 04/08/15 0506  WBC 10.2 8.4 9.7  HGB 12.7* 11.5* 11.9*  HCT 39.0 36.8* 35.6*  PLT 194 175 170  MCV 93.1 94.1 93.9  MCH 30.3 29.4 31.4  MCHC 32.6 31.3 33.4  RDW 13.9 13.9 13.8  LYMPHSABS 2.0  --   --   MONOABS 0.7  --   --   EOSABS 0.5  --   --   BASOSABS 0.0  --   --     Chemistries   Recent Labs Lab 04/06/15 1103 04/07/15 0430 04/08/15 0506  NA 141 143 141  K 4.7 4.4 5.1  CL 106 114* 109  CO2 25 24 25   GLUCOSE 100* 125* 121*  BUN 18 17 19   CREATININE 1.88* 1.83* 1.85*  CALCIUM 9.3 8.6* 8.8*    CBG: No results for input(s): GLUCAP in the last 168 hours.  GFR Estimated Creatinine Clearance: 59.4 mL/min (by C-G formula based on Cr of 1.85).  Coagulation profile No results for input(s): INR, PROTIME in the last 168 hours.  Cardiac Enzymes No results for input(s): CKMB, TROPONINI, MYOGLOBIN in the last 168 hours.  Invalid input(s): CK  Invalid input(s): POCBNP No results for input(s): DDIMER in the last 72 hours. No results for input(s): HGBA1C in the last 72 hours. No results for input(s): CHOL, HDL, LDLCALC, TRIG, CHOLHDL, LDLDIRECT in the last 72 hours. No results for input(s): TSH, T4TOTAL, T3FREE, THYROIDAB in the last 72 hours.  Invalid input(s): FREET3 No results for input(s): VITAMINB12, FOLATE, FERRITIN, TIBC, IRON, RETICCTPCT in the last 72 hours. No results for input(s): LIPASE, AMYLASE in the last 72 hours.  Urine Studies No results for input(s): UHGB, CRYS in the last 72 hours.  Invalid input(s): UACOL, UAPR, USPG, UPH, UTP, UGL, UKET, UBIL, UNIT, UROB, ULEU, UEPI, UWBC, URBC, UBAC, CAST, UCOM, BILUA  MICROBIOLOGY: Recent Results (from the past 240 hour(s))  Clostridium Difficile by PCR     Status: None   Collection Time: 03/30/15 11:49 AM  Result Value Ref Range Status   C difficile by pcr NEGATIVE NEGATIVE Final  Culture, blood (single)     Status: None (Preliminary result)    Collection Time: 04/06/15 10:53 AM  Result Value Ref Range Status   Specimen Description BLOOD RIGHT HAND  Final   Special Requests BOTTLES DRAWN AEROBIC AND ANAEROBIC 5CC EACH  Final   Culture   Final           BLOOD CULTURE RECEIVED NO GROWTH TO DATE CULTURE WILL BE HELD FOR 5 DAYS BEFORE ISSUING A FINAL NEGATIVE REPORT Performed at [facility]    Report Status PENDING  Incomplete    RADIOLOGY STUDIES/RESULTS: Dg Chest 2 View  03/27/2015   CLINICAL  DATA:  Tremors, onset at noon today. Weakness, fever and chills.  EXAM: CHEST  2 VIEW  COMPARISON:  11/17/2014  FINDINGS: There is mild linear atelectasis or infiltrate in the left lung base as well as a probable small left effusion. The right lung is clear. Hilar, mediastinal and cardiac contours are unremarkable and unchanged.  IMPRESSION: Mild left base linear opacities and probable small effusion, possibly due to pneumonia. Followup PA and lateral chest X-ray is recommended in 3-4 weeks to ensure resolution and exclude underlying malignancy.   Electronically Signed   By: Andreas Newport M.D.   On: 03/27/2015 17:44   Dg Abd 1 View  03/27/2015   CLINICAL DATA:  Abdominal distention and fever. Weakness. Chills. Non-Hodgkin's lymphoma  EXAM: ABDOMEN - 1 VIEW  COMPARISON:  None.  FINDINGS: No evidence of dilated bowel loops. A large stool burden is seen throughout the majority of the colon. No radiopaque calculi or signs of abnormal mass effect identified.  IMPRESSION: No acute findings.  Large stool burden noted; suggest clinical correlation for possible constipation.   Electronically Signed   By: Earle Gell M.D.   On: 03/27/2015 17:47    Oren Binet, MD  [facility] Pager:[phone]  If 7PM-7AM, please contact night-coverage [email or web address] Password [email or web address] 04/08/2015, 2:42 PM   LOS: 2 days

## 2015-04-08 NOTE — Consult Note (Signed)
WOC wound consult note Reason for Consult: R leg weeping, cellulitis Wound type: partial thickness areas that area dry now, no active weeping.  Leg has intense erythema with some hyperkeratosis at the medial heel and malleolus.  He is unable to tell me if this area has been present prior to the redness.  He has an area marked with marker that indicates the cellulitis has not improved, but there is no weeping currently.     Pressure Ulcer POA: No Wound bed: one area on the medial calf that is intact but with some skin changes, may have been area of weeping, not actively blistered at this time.  No drainage.  Drainage (amount, consistency, odor) none Dressing procedure/placement/frequency: Does not appear to have venous stasis, no need for topical care at this time.  Palpable pulses bilaterally. 2+ edema on the R, none on the LE.  Elevate as much as possible. Pt verbalized understanding, however per bedside nursing has difficulty following instructions and is quite impulsive probably related to his mental health issues.   Discussed POC with patient and bedside nurse.  Re consult if needed, will not follow at this time. Thanks  [name], [place] [phone])

## 2015-04-08 NOTE — Progress Notes (Signed)
VASCULAR LAB PRELIMINARY  PRELIMINARY  PRELIMINARY  PRELIMINARY  Right lower extremity venous duplex completed.    Preliminary report:  Right:  No evidence of DVT, superficial thrombosis, or Baker's cyst.  Brinley Treanor, RVT 04/08/2015, 10:38 AM

## 2015-04-08 NOTE — Progress Notes (Signed)
MRI tech will call Pts guardian Gildardo Pounds [phone] to obtain any past surgical hx info and gather other  Needed facts for pt to have MRI which is ordered for today.Aggie Moats D

## 2015-04-09 DIAGNOSIS — N184 Chronic kidney disease, stage 4 (severe): Secondary | ICD-10-CM

## 2015-04-09 LAB — BASIC METABOLIC PANEL
ANION GAP: 7 (ref 5–15)
BUN: 21 mg/dL — ABNORMAL HIGH (ref 6–20)
CHLORIDE: 110 mmol/L (ref 101–111)
CO2: 25 mmol/L (ref 22–32)
Calcium: 9.1 mg/dL (ref 8.9–10.3)
Creatinine, Ser: 1.69 mg/dL — ABNORMAL HIGH (ref 0.61–1.24)
GFR calc non Af Amer: 42 mL/min — ABNORMAL LOW (ref >60)
GFR, EST AFRICAN AMERICAN: 49 mL/min — AB (ref >60)
GLUCOSE: 128 mg/dL — AB (ref 65–99)
Potassium: 4.8 mmol/L (ref 3.5–5.1)
SODIUM: 142 mmol/L (ref 135–145)

## 2015-04-09 NOTE — Progress Notes (Addendum)
GuardianshipTheone Moran [phone] ext [phone].

## 2015-04-09 NOTE — Clinical Social Work Note (Signed)
Clinical Social Work Assessment  Patient Details  Name: [name] MRN: [id number] Date of Birth: Feb 11, 1951  Date of referral:  04/08/15               Reason for consult:  Facility Placement, Discharge Planning                Permission sought to share information with:    Permission granted to share information::     Name::        Agency::     Relationship::     Contact Information:     Housing/Transportation Living arrangements for the past 2 months:  Group Home Source of Information:  Guardian Patient Interpreter Needed:  None Criminal Activity/Legal Involvement Pertinent to Current Situation/Hospitalization:  No - Comment as needed Significant Relationships:  Other(Comment) (guardian) Lives with:  Other (Comment) (Group Home) Do you feel safe going back to the place where you live?   (Plans to return to group home.) Need for family participation in patient care:   Guardian participates in care planning.  Care giving concerns:  No concerns voiced at this time.   Social Worker assessment / plan: Pt admitted on 5/21 /16 for cellulitis of lower leg. Pt is being treated with IV antibiotics. Pt is from [place]. Pt's guardian is Leighton Ruff [phone] ext [phone]. Pt is expected to return to group home once medically stable and IV antibiotics are no longer needed.  Employment status:  Disabled (Comment on whether or not currently receiving Disability) Insurance information:    PT Recommendations:  Not assessed at this time Information / Referral to community resources:  Other (Comment Required) (None needed at this time.)  Patient/Family's Response to care: Guardian is pleased with care being provided. Pt can be in appropriate towards staff at times.  Patient/Family's Understanding of and Emotional Response to Diagnosis, Current Treatment, and Prognosis:  Pt has limited understanding of medical issues. Guardian is grateful for care being provided  and feels returning to group home once stable is appropriate.  Emotional Assessment Appearance:  Appears stated age Attitude/Demeanor/Rapport:   (Cooperative at times.) Affect (typically observed):    Orientation:  Oriented to Self, Oriented to Place Alcohol / Substance use:  Not Applicable Psych involvement (Current and /or in the community):  No (Comment)  Discharge Needs  Concerns to be addressed:  Discharge Planning Concerns Readmission within the last 30 days:  No Current discharge risk:  None Barriers to Discharge:  No Barriers Identified   Bettyanne Dittman, Randall An, LCSW 04/09/2015, 1:25 PM

## 2015-04-09 NOTE — Plan of Care (Signed)
Problem: Discharge Progression Outcomes Goal: Patient verbalizes wound care regimen Outcome: Not Met (add Reason) Patient has decreased cognitive ability. Non-compliant with elevation of RLE on pillows.

## 2015-04-09 NOTE — Progress Notes (Signed)
Patient began with inappropriate comments to male staff this AM. Spoke with patient and informed him that his statements and requests were inappropriate at this facility. Patient asks "where is it okay?" This nurse replied "In the privacy of your own home".

## 2015-04-09 NOTE — Progress Notes (Signed)
PATIENT DETAILS Name: [name] Age: 64 y.o. Sex: male Date of Birth: April 17, 1951 Admit Date: 04/06/2015 Admitting Physician Oswald Hillock, MD PCP:No primary care provider on file.  Brief Narrative 64 year old male with history of schizophrenia just recently discharged from this facility after treatment for right lower leg cellulitis readmitted on 5/21 for worsening right lower leg cellulitis. Slowly improving with IV Ancef.  Subjective: Some clearing of erythema in the lateral aspect of the right leg. Remains afebrile  Assessment/Plan: Active Problems:   Right lower extremity cellulitis: Recently discharged on Bactrim-was on IV Rocephin and vancomycin during his recent hospitalization. Right lower extremity remains erythematous-but better today-erythema clearing in the lateral aspect. Doubt MRSA infection, will change to Ancef. Right lower ext Dopplers negative, since improving will hold off on MR for now as low suspicion for deep infection. Keep leg elevated at all times. Continue IV Ancef. Appreciate wound care evaluation    History of CLL and non-Hodgkin's lymphoma: In remission. Reviewed most recent outpatient progress note from oncology. Follow CBC periodically.    History of chronic kidney disease stage III: Creatinine close to usual baseline. Follow electrolytes closely.    History of schizophrenia: Continue Cogentin, Thorazine, clozapine, Lexapro, Lamictal. Apparently lives in a group home.    Hypothyroidism: Continue with levothyroxine  Disposition: Remain inpatient-suspect will require 2-3 additional days of hospitalization  Antimicrobial agents  See below  Anti-infectives    Start     Dose/Rate Route Frequency Ordered Stop   04/07/15 1400  ceFAZolin (ANCEF) IVPB 2 g/50 mL premix     2 g 100 mL/hr over 30 Minutes Intravenous 3 times per day 04/07/15 1118     04/07/15 0600  vancomycin (VANCOCIN) 1,500 mg in sodium chloride 0.9 % 500 mL IVPB   Status:  Discontinued     1,500 mg 250 mL/hr over 120 Minutes Intravenous Every 24 hours 04/06/15 1332 04/07/15 1102   04/06/15 1015  vancomycin (VANCOCIN) 2,000 mg in sodium chloride 0.9 % 500 mL IVPB     2,000 mg 250 mL/hr over 120 Minutes Intravenous  Once 04/06/15 1000 04/06/15 1306      DVT Prophylaxis: Prophylactic Lovenox   Code Status: Full code  Family Communication None at bedside  Procedures: None  CONSULTS:  None  Time spent 25 minutes-Greater than 50% of this time was spent in counseling, explanation of diagnosis, planning of further management, and coordination of care.  MEDICATIONS: Scheduled Meds: . aspirin EC  81 mg Oral Daily  . benztropine  0.5 mg Oral Daily  .  ceFAZolin (ANCEF) IV  2 g Intravenous 3 times per day  . chlorproMAZINE  50 mg Oral BID  . clozapine  200 mg Oral BID  . docusate sodium  100 mg Oral Daily  . enoxaparin (LOVENOX) injection  40 mg Subcutaneous Q24H  . escitalopram  10 mg Oral QAC breakfast  . ezetimibe  10 mg Oral Daily  . lamoTRIgine  50 mg Oral BID  . levothyroxine  100 mcg Oral QAC breakfast  . polyethylene glycol  17 g Oral Daily   Continuous Infusions:  PRN Meds:.acetaminophen **OR** acetaminophen, morphine injection, ondansetron **OR** ondansetron (ZOFRAN) IV    PHYSICAL EXAM: Vital signs in last 24 hours: Filed Vitals:   04/08/15 0624 04/08/15 1357 04/08/15 2030 04/09/15 0500  BP: 106/48 129/70 130/72 118/76  Pulse: 70 107 90 88  Temp: 97.8 F (36.6 C) 98 F (  36.7 C) 98.3 F (36.8 C) 98.5 F (36.9 C)  TempSrc: Axillary Oral Oral Oral  Resp: 18 18 16 16   Height:      Weight:      SpO2: 100% 100% 100% 100%    Weight change:  Filed Weights   04/06/15 1245  Weight: 130.636 kg (288 lb)   Body mass index is 38.01 kg/(m^2).   Gen Exam: Awake and alert with clear speech.   Neck: Supple, No JVD.   Chest: B/L Clear.   CVS: S1 S2 Regular, no murmurs.  Abdomen: soft, BS +, non tender, non distended.    Extremities: no edema, lower extremities warm to touch.See below pic re right leg Neurologic: Non Focal.   Skin: No Rash.   Wounds: N/A.   Right Leg   Right leg     Intake/Output from previous day:  Intake/Output Summary (Last 24 hours) at 04/09/15 1408 Last data filed at 04/09/15 1016  Gross per 24 hour  Intake   2760 ml  Output   3250 ml  Net   -490 ml     LAB RESULTS: CBC  Recent Labs Lab 04/06/15 1103 04/07/15 0430 04/08/15 0506  WBC 10.2 8.4 9.7  HGB 12.7* 11.5* 11.9*  HCT 39.0 36.8* 35.6*  PLT 194 175 170  MCV 93.1 94.1 93.9  MCH 30.3 29.4 31.4  MCHC 32.6 31.3 33.4  RDW 13.9 13.9 13.8  LYMPHSABS 2.0  --   --   MONOABS 0.7  --   --   EOSABS 0.5  --   --   BASOSABS 0.0  --   --     Chemistries   Recent Labs Lab 04/06/15 1103 04/07/15 0430 04/08/15 0506 04/09/15 0448  NA 141 143 141 142  K 4.7 4.4 5.1 4.8  CL 106 114* 109 110  CO2 25 24 25 25   GLUCOSE 100* 125* 121* 128*  BUN 18 17 19  21*  CREATININE 1.88* 1.83* 1.85* 1.69*  CALCIUM 9.3 8.6* 8.8* 9.1    CBG: No results for input(s): GLUCAP in the last 168 hours.  GFR Estimated Creatinine Clearance: 65.1 mL/min (by C-G formula based on Cr of 1.69).  Coagulation profile No results for input(s): INR, PROTIME in the last 168 hours.  Cardiac Enzymes No results for input(s): CKMB, TROPONINI, MYOGLOBIN in the last 168 hours.  Invalid input(s): CK  Invalid input(s): POCBNP No results for input(s): DDIMER in the last 72 hours. No results for input(s): HGBA1C in the last 72 hours. No results for input(s): CHOL, HDL, LDLCALC, TRIG, CHOLHDL, LDLDIRECT in the last 72 hours. No results for input(s): TSH, T4TOTAL, T3FREE, THYROIDAB in the last 72 hours.  Invalid input(s): FREET3 No results for input(s): VITAMINB12, FOLATE, FERRITIN, TIBC, IRON, RETICCTPCT in the last 72 hours. No results for input(s): LIPASE, AMYLASE in the last 72 hours.  Urine Studies No results for input(s): UHGB, CRYS  in the last 72 hours.  Invalid input(s): UACOL, UAPR, USPG, UPH, UTP, UGL, UKET, UBIL, UNIT, UROB, ULEU, UEPI, UWBC, URBC, UBAC, CAST, UCOM, BILUA  MICROBIOLOGY: Recent Results (from the past 240 hour(s))  Culture, blood (single)     Status: None (Preliminary result)   Collection Time: 04/06/15 10:53 AM  Result Value Ref Range Status   Specimen Description BLOOD RIGHT HAND  Final   Special Requests BOTTLES DRAWN AEROBIC AND ANAEROBIC 5CC EACH  Final   Culture   Final           BLOOD CULTURE RECEIVED NO  GROWTH TO DATE CULTURE WILL BE HELD FOR 5 DAYS BEFORE ISSUING A FINAL NEGATIVE REPORT Performed at [facility]    Report Status PENDING  Incomplete    RADIOLOGY STUDIES/RESULTS: Dg Chest 2 View  03/27/2015   CLINICAL DATA:  Tremors, onset at noon today. Weakness, fever and chills.  EXAM: CHEST  2 VIEW  COMPARISON:  11/17/2014  FINDINGS: There is mild linear atelectasis or infiltrate in the left lung base as well as a probable small left effusion. The right lung is clear. Hilar, mediastinal and cardiac contours are unremarkable and unchanged.  IMPRESSION: Mild left base linear opacities and probable small effusion, possibly due to pneumonia. Followup PA and lateral chest X-ray is recommended in 3-4 weeks to ensure resolution and exclude underlying malignancy.   Electronically Signed   By: Andreas Newport M.D.   On: 03/27/2015 17:44   Dg Abd 1 View  03/27/2015   CLINICAL DATA:  Abdominal distention and fever. Weakness. Chills. Non-Hodgkin's lymphoma  EXAM: ABDOMEN - 1 VIEW  COMPARISON:  None.  FINDINGS: No evidence of dilated bowel loops. A large stool burden is seen throughout the majority of the colon. No radiopaque calculi or signs of abnormal mass effect identified.  IMPRESSION: No acute findings.  Large stool burden noted; suggest clinical correlation for possible constipation.   Electronically Signed   By: Earle Gell M.D.   On: 03/27/2015 17:47    Oren Binet, MD  [facility] Pager:[phone]  If 7PM-7AM, please contact night-coverage [email or web address] Password [email or web address] 04/09/2015, 2:08 PM   LOS: 3 days

## 2015-04-09 NOTE — Plan of Care (Signed)
Problem: Consults Goal: Cellulitis Patient Education See Patient Education Module for education specifics.  Outcome: Not Met (add Reason) Pt has diminished cognitive ability.

## 2015-04-10 ENCOUNTER — Other Ambulatory Visit: Payer: Self-pay

## 2015-04-10 DIAGNOSIS — E039 Hypothyroidism, unspecified: Secondary | ICD-10-CM

## 2015-04-10 DIAGNOSIS — L03115 Cellulitis of right lower limb: Principal | ICD-10-CM

## 2015-04-10 DIAGNOSIS — E785 Hyperlipidemia, unspecified: Secondary | ICD-10-CM

## 2015-04-10 DIAGNOSIS — F209 Schizophrenia, unspecified: Secondary | ICD-10-CM

## 2015-04-10 MED ORDER — CLOZAPINE 100 MG PO TABS
100.0000 mg | ORAL_TABLET | Freq: Two times a day (BID) | ORAL | Status: DC
  Administered 2015-04-10 – 2015-04-11 (×2): 100 mg via ORAL
  Filled 2015-04-10 (×3): qty 1

## 2015-04-10 MED ORDER — CHLORPROMAZINE HCL 25 MG PO TABS
25.0000 mg | ORAL_TABLET | Freq: Two times a day (BID) | ORAL | Status: DC
  Administered 2015-04-10 – 2015-04-11 (×2): 25 mg via ORAL
  Filled 2015-04-10 (×3): qty 1

## 2015-04-10 NOTE — Progress Notes (Addendum)
Patient ID: [name], male   DOB: 1951/01/24, 64 y.o.   MRN: [id number] [facility] PROGRESS NOTE  KYNG KLENKE [id number] DOB: May 23, 1951 DOA: 04/06/2015 PCP: [facility]  Brief narrative:    64 year old male with past medical history of schizophrenia, CLL and a non-Hodgkin's lymphoma in remission, chronic kidney disease stage III who presented to [facility] with worsening redness and pain in the right lower extremity. Patient was started on Ancef empirically. Blood cultures so far show no growth to date.  Barrier to discharge: Cellulitis improving slowly with Ancef. Anticipated discharge by 04/11/2015.  Assessment/Plan:    Principal problem: Right lower extremity cellulitis - Please noted that patient was recently discharged on Bactrim for right lower extremity cellulitis. He did not experience significant improvement. - Doppler study of the right lower extremity did not show DVT - Patient is being treated with IV Ancef. Continue this for next 24 hours and if further improvement then anticipated discharge tomorrow.  Active Problems: History of CLL and non-Hodgkin's lymphoma - In remission  History of schizophrenia - Continue Cogentin, Thorazine, Clozapine, Lexapro, Lamictal.  - Stable.  Hypothyroidism - Continue levothyroxine  Dyslipidemia - Continue Zetia 10 mg daily  CKD stage 3 - Creatinine 1.69 - Baseline Cr around 2.    DVT Prophylaxis  - Lovenox subcutaneous ordered.   Code Status: Full.  Family Communication:  plan of care discussed with the patient Disposition Plan: Home 04/11/2015.  IV access:  Peripheral IV  Procedures and diagnostic studies:    No results found.  Medical Consultants:  None   Other Consultants:  None   IAnti-Infectives:   Ancef 04/06/2015 -->    Leisa Lenz, MD  [facility] Pager [phone]  Time spent in minutes: 25 minutes  If 7PM-7AM, please contact night-coverage [email or web address] Password  [facility] 04/10/2015, 11:39 AM   LOS: 4 days    HPI/Subjective: No acute overnight events. Patient reports no nausea or vomiting. Feels little better this am.   Objective: Filed Vitals:   04/09/15 0500 04/09/15 1400 04/09/15 2108 04/10/15 0618  BP: 118/76 140/77 136/64 137/64  Pulse: 88 88 79 86  Temp: 98.5 F (36.9 C) 97.8 F (36.6 C) 98.3 F (36.8 C) 98.7 F (37.1 C)  TempSrc: Oral Oral Oral Oral  Resp: 16 18 16 16   Height:      Weight:      SpO2: 100% 98% 98% 95%    Intake/Output Summary (Last 24 hours) at 04/10/15 1139 Last data filed at 04/10/15 1102  Gross per 24 hour  Intake   1180 ml  Output   2425 ml  Net  -1245 ml    Exam:   General:  Pt is alert, follows commands appropriately, not in acute distress  Cardiovascular: Regular rate and rhythm, S1/S2, no murmurs  Respiratory: Clear to auscultation bilaterally, no wheezing, no crackles, no rhonchi  Abdomen: Soft, non tender, non distended, bowel sounds present  Extremities: Right lower extremity redness still appreciated but per pt it is getting better, pulses DP and PT palpable bilaterally  Neuro: Grossly nonfocal  Data Reviewed: Basic Metabolic Panel:  Recent Labs Lab 04/06/15 1103 04/07/15 0430 04/08/15 0506 04/09/15 0448  NA 141 143 141 142  K 4.7 4.4 5.1 4.8  CL 106 114* 109 110  CO2 25 24 25 25   GLUCOSE 100* 125* 121* 128*  BUN 18 17 19  21*  CREATININE 1.88* 1.83* 1.85* 1.69*  CALCIUM 9.3 8.6* 8.8* 9.1   Liver Function Tests:  Recent  Labs Lab 04/07/15 0430  AST 33  ALT 98*  ALKPHOS 160*  BILITOT 0.4  PROT 5.4*  ALBUMIN 3.0*   No results for input(s): LIPASE, AMYLASE in the last 168 hours. No results for input(s): AMMONIA in the last 168 hours. CBC:  Recent Labs Lab 04/06/15 1103 04/07/15 0430 04/08/15 0506  WBC 10.2 8.4 9.7  NEUTROABS 7.0  --   --   HGB 12.7* 11.5* 11.9*  HCT 39.0 36.8* 35.6*  MCV 93.1 94.1 93.9  PLT 194 175 170   Cardiac Enzymes: No results for  input(s): CKTOTAL, CKMB, CKMBINDEX, TROPONINI in the last 168 hours. BNP: Invalid input(s): POCBNP CBG: No results for input(s): GLUCAP in the last 168 hours.  Culture, blood (single)     Status: None (Preliminary result)   Collection Time: 04/06/15 10:53 AM  Result Value Ref Range Status   Specimen Description BLOOD RIGHT HAND  Final   Special Requests BOTTLES DRAWN AEROBIC AND ANAEROBIC 5CC EACH  Final   Culture   Final           BLOOD CULTURE RECEIVED NO GROWTH TO DATE     Report Status PENDING  Incomplete     Scheduled Meds: . aspirin EC  81 mg Oral Daily  . benztropine  0.5 mg Oral Daily  . ceFAZolin (ANCEF) IV  2 g Intravenous 3 times per day  . chlorproMAZINE  50 mg Oral BID  . clozapine  200 mg Oral BID  . docusate sodium  100 mg Oral Daily  . enoxaparin (LOVENOX)   40 mg Subcutaneous Q24H  . escitalopram  10 mg Oral QAC breakfast  . ezetimibe  10 mg Oral Daily  . lamoTRIgine  50 mg Oral BID  . levothyroxine  100 mcg Oral QAC breakfast  . polyethylene glycol  17 g Oral Daily

## 2015-04-10 NOTE — Progress Notes (Signed)
CSW following to assist with d/c planning. Met with pt's caseworker/guardian on 5/25 [name] [phone] ). Pt plans to return to group home once medically stable and IV antibiotics are no longer required.  Werner Lean LCSW [phone]

## 2015-04-11 DIAGNOSIS — N183 Chronic kidney disease, stage 3 (moderate): Secondary | ICD-10-CM

## 2015-04-11 MED ORDER — CHLORPROMAZINE HCL 25 MG PO TABS: 25.0000 mg | ORAL_TABLET | Freq: Two times a day (BID) | ORAL | Status: DC

## 2015-04-11 MED ORDER — CLINDAMYCIN HCL 150 MG PO CAPS: 150.0000 mg | ORAL_CAPSULE | Freq: Three times a day (TID) | ORAL | Status: DC

## 2015-04-11 MED ORDER — CLOZAPINE 100 MG PO TABS: 100.0000 mg | ORAL_TABLET | Freq: Two times a day (BID) | ORAL | Status: DC

## 2015-04-11 NOTE — Discharge Instructions (Signed)

## 2015-04-11 NOTE — Progress Notes (Signed)
Pt d/c back to [facility] ( [facility] ) today. Pt able to transport by car. Group Home and Guardian Gildardo Pounds ) notified. NSG reviewed d/c summary, scripts, avs. Scripts included in d/c packet. Guardian transported pt back to group home.  Werner Lean LCSW [phone]

## 2015-04-11 NOTE — Discharge Summary (Signed)
Physician Discharge Summary  [name] [id number] DOB: 12-Aug-1951 DOA: 04/06/2015  PCP: Pt from group home. Pt has PCP, CM to confirm the name.   Admit date: 04/06/2015 Discharge date: 04/11/2015  Recommendations for Outpatient Follow-up:  1. Please note we have reduced the dose of Thorazine 25 mg twice daily and clozapine to 100 mg twice daily because of QT prolongation seen on 12-lead EKG during this admission. QT interval or the improved with making those changes. 2. Continue clindamycin for 7 days on discharge for treatment of cellulitis.  Discharge Diagnoses:  Active Problems:   Schizophrenia   Cellulitis of right lower extremity    Discharge Condition: stable   Diet recommendation: as tolerated   History of present illness:   64 year old male with past medical history of schizophrenia, CLL and a non-Hodgkin's lymphoma in remission, chronic kidney disease stage III who presented to [facility] hospital with worsening redness and pain in the right lower extremity. Patient was started on Ancef empirically. Blood cultures so far show no growth to date.  Hospital Course:   Assessment/Plan:    Principal problem: Right lower extremity cellulitis - Please noted that patient was recently discharged on Bactrim for right lower extremity cellulitis. He did not experience significant improvement. - Doppler study of the right lower extremity did not show DVT - Patient was started on IV Ancef at the time of the admission. There is an improvement in cellulitis. - We will prescribe clindamycin for 7 days on discharge.  Active Problems: History of CLL and non-Hodgkin's lymphoma - In remission  History of schizophrenia - Continue Cogentin, Thorazine, Clozapine, Lexapro, Lamictal.  - Stable. - Please note that because of prolonged QT interval at the time of the admission we have reduced the dose of Thorazine and clozapine. QT interval already improved.  Hypothyroidism -  Continue levothyroxine  Dyslipidemia - Continue Zetia 10 mg daily  CKD stage 3 - Baseline creatinine around 2. Creatinine on this admission 1.69. Stable.   DVT Prophylaxis  - Lovenox subcutaneous ordered while pt is in hospital    Code Status: Full.  Family Communication: plan of care discussed with the patient; family not at the bedside    IV access:  Peripheral IV  Procedures and diagnostic studies:   No results found.  Medical Consultants:  None   Other Consultants:  None   IAnti-Infectives:   Ancef 04/06/2015 --> 04/11/2015    Signed:  Leisa Lenz, MD  [facility] 04/11/2015, 9:26 AM  Pager #: [phone]  Time spent in minutes: more than 30 minutes    Discharge Exam: Filed Vitals:   04/11/15 0419  BP: 114/52  Pulse: 85  Temp: 98.5 F (36.9 C)  Resp: 18   Filed Vitals:   04/10/15 0618 04/10/15 1338 04/10/15 2153 04/11/15 0419  BP: 137/64 130/64 147/66 114/52  Pulse: 86 90 74 85  Temp: 98.7 F (37.1 C) 98 F (36.7 C) 99 F (37.2 C) 98.5 F (36.9 C)  TempSrc: Oral Oral Oral Oral  Resp: 16 16 18 18   Height:      Weight:      SpO2: 95% 95% 100% 95%    General: Pt is alert, follows commands appropriately, not in acute distress Cardiovascular: Regular rate and rhythm, S1/S2 +, no murmurs Respiratory: Clear to auscultation bilaterally, no wheezing, no crackles, no rhonchi Abdominal: Soft, non tender, non distended, bowel sounds +, no guarding Extremities: right lower extremity cellulitis improvin, no cyanosis, pulses palpable bilaterally DP and PT Neuro:  Grossly nonfocal  Discharge Instructions  Discharge Instructions    Call MD for:  difficulty breathing, headache or visual disturbances    Complete by:  As directed      Call MD for:  persistant nausea and vomiting    Complete by:  As directed      Call MD for:  severe uncontrolled pain    Complete by:  As directed      Diet - low sodium heart healthy     Complete by:  As directed      Discharge instructions    Complete by:  As directed   1. Please note we have reduced the dose of Thorazine 25 mg twice daily and clozapine to 100 mg twice daily because of QT prolongation seen on 12-lead EKG during this admission. QT interval or the improved with making those changes. 2. Continue clindamycin for 7 days on discharge for treatment of cellulitis.     Increase activity slowly    Complete by:  As directed             Medication List    STOP taking these medications        sulfamethoxazole-trimethoprim 800-160 MG per tablet  Commonly known as:  BACTRIM DS,SEPTRA DS      TAKE these medications        aspirin EC 81 MG tablet  Take 81 mg by mouth daily.     benztropine 0.5 MG tablet  Commonly known as:  COGENTIN  Take 0.5 mg by mouth daily.     chlorproMAZINE 25 MG tablet  Commonly known as:  THORAZINE  Take 1 tablet (25 mg total) by mouth 2 (two) times daily.     cholecalciferol 1000 UNITS tablet  Commonly known as:  VITAMIN D  Take 1,000 Units by mouth daily.     clindamycin 150 MG capsule  Commonly known as:  CLEOCIN  Take 1 capsule (150 mg total) by mouth 3 (three) times daily.     cloZAPine 100 MG tablet  Commonly known as:  CLOZARIL  Take 1 tablet (100 mg total) by mouth 2 (two) times daily.     docusate sodium 100 MG capsule  Commonly known as:  COLACE  Take 100 mg by mouth daily.     escitalopram 10 MG tablet  Commonly known as:  LEXAPRO  Take 10 mg by mouth daily before breakfast.     ezetimibe 10 MG tablet  Commonly known as:  ZETIA  Take 10 mg by mouth daily.     ketoconazole 2 % cream  Commonly known as:  NIZORAL  Apply 1 application topically 2 (two) times daily.     lamoTRIgine 25 MG tablet  Commonly known as:  LAMICTAL  Take 50 mg by mouth 2 (two) times daily.     levothyroxine 100 MCG tablet  Commonly known as:  SYNTHROID, LEVOTHROID  Take 100 mcg by mouth daily before breakfast.     mineral  oil liquid  Place into the right ear once a week. Instill 2 drops into both ears once weekly     Omega-3 Fatty Acids 1000 MG Cpdr  Take 4,000 mg by mouth daily.     polyethylene glycol packet  Commonly known as:  MIRALAX / GLYCOLAX  Take 17 g by mouth daily.     senna 8.6 MG tablet  Commonly known as:  SENOKOT  Take 1 tablet by mouth daily.          The results  of significant diagnostics from this hospitalization (including imaging, microbiology, ancillary and laboratory) are listed below for reference.    Significant Diagnostic Studies: Dg Chest 2 View  03/27/2015   CLINICAL DATA:  Tremors, onset at noon today. Weakness, fever and chills.  EXAM: CHEST  2 VIEW  COMPARISON:  11/17/2014  FINDINGS: There is mild linear atelectasis or infiltrate in the left lung base as well as a probable small left effusion. The right lung is clear. Hilar, mediastinal and cardiac contours are unremarkable and unchanged.  IMPRESSION: Mild left base linear opacities and probable small effusion, possibly due to pneumonia. Followup PA and lateral chest X-ray is recommended in 3-4 weeks to ensure resolution and exclude underlying malignancy.   Electronically Signed   By: Andreas Newport M.D.   On: 03/27/2015 17:44   Dg Abd 1 View  03/27/2015   CLINICAL DATA:  Abdominal distention and fever. Weakness. Chills. Non-Hodgkin's lymphoma  EXAM: ABDOMEN - 1 VIEW  COMPARISON:  None.  FINDINGS: No evidence of dilated bowel loops. A large stool burden is seen throughout the majority of the colon. No radiopaque calculi or signs of abnormal mass effect identified.  IMPRESSION: No acute findings.  Large stool burden noted; suggest clinical correlation for possible constipation.   Electronically Signed   By: Earle Gell M.D.   On: 03/27/2015 17:47    Microbiology: Recent Results (from the past 240 hour(s))  Culture, blood (single)     Status: None (Preliminary result)   Collection Time: 04/06/15 10:53 AM  Result Value  Ref Range Status   Specimen Description BLOOD RIGHT HAND  Final   Special Requests BOTTLES DRAWN AEROBIC AND ANAEROBIC 5CC EACH  Final   Culture   Final           BLOOD CULTURE RECEIVED NO GROWTH TO DATE CULTURE WILL BE HELD FOR 5 DAYS BEFORE ISSUING A FINAL NEGATIVE REPORT Performed at [facility]    Report Status PENDING  Incomplete     Labs: Basic Metabolic Panel:  Recent Labs Lab 04/06/15 1103 04/07/15 0430 04/08/15 0506 04/09/15 0448  NA 141 143 141 142  K 4.7 4.4 5.1 4.8  CL 106 114* 109 110  CO2 25 24 25 25   GLUCOSE 100* 125* 121* 128*  BUN 18 17 19  21*  CREATININE 1.88* 1.83* 1.85* 1.69*  CALCIUM 9.3 8.6* 8.8* 9.1   Liver Function Tests:  Recent Labs Lab 04/07/15 0430  AST 33  ALT 98*  ALKPHOS 160*  BILITOT 0.4  PROT 5.4*  ALBUMIN 3.0*   No results for input(s): LIPASE, AMYLASE in the last 168 hours. No results for input(s): AMMONIA in the last 168 hours. CBC:  Recent Labs Lab 04/06/15 1103 04/07/15 0430 04/08/15 0506  WBC 10.2 8.4 9.7  NEUTROABS 7.0  --   --   HGB 12.7* 11.5* 11.9*  HCT 39.0 36.8* 35.6*  MCV 93.1 94.1 93.9  PLT 194 175 170   Cardiac Enzymes: No results for input(s): CKTOTAL, CKMB, CKMBINDEX, TROPONINI in the last 168 hours. BNP: BNP (last 3 results) No results for input(s): BNP in the last 8760 hours.  ProBNP (last 3 results) No results for input(s): PROBNP in the last 8760 hours.  CBG: No results for input(s): GLUCAP in the last 168 hours.

## 2015-04-12 LAB — CULTURE, BLOOD (SINGLE): Culture: NO GROWTH

## 2015-04-23 ENCOUNTER — Emergency Department (HOSPITAL_COMMUNITY)
Admission: EM | Admit: 2015-04-23 | Discharge: 2015-04-24 | Disposition: A | Discharge status: Home / Self Care | Payer: Medicare Other | Attending: Emergency Medicine | Admitting: Emergency Medicine

## 2015-04-23 ENCOUNTER — Emergency Department (HOSPITAL_COMMUNITY): Payer: Medicare Other

## 2015-04-23 ENCOUNTER — Encounter (HOSPITAL_COMMUNITY): Payer: Self-pay | Admitting: *Deleted

## 2015-04-23 DIAGNOSIS — S0011XA Contusion of right eyelid and periocular area, initial encounter: Secondary | ICD-10-CM | POA: Diagnosis not present

## 2015-04-23 DIAGNOSIS — Z856 Personal history of leukemia: Secondary | ICD-10-CM | POA: Diagnosis not present

## 2015-04-23 DIAGNOSIS — S0083XA Contusion of other part of head, initial encounter: Secondary | ICD-10-CM | POA: Insufficient documentation

## 2015-04-23 DIAGNOSIS — K029 Dental caries, unspecified: Secondary | ICD-10-CM | POA: Insufficient documentation

## 2015-04-23 DIAGNOSIS — Z792 Long term (current) use of antibiotics: Secondary | ICD-10-CM | POA: Insufficient documentation

## 2015-04-23 DIAGNOSIS — Y92128 Other place in nursing home as the place of occurrence of the external cause: Secondary | ICD-10-CM | POA: Diagnosis not present

## 2015-04-23 DIAGNOSIS — N189 Chronic kidney disease, unspecified: Secondary | ICD-10-CM | POA: Diagnosis not present

## 2015-04-23 DIAGNOSIS — Z7982 Long term (current) use of aspirin: Secondary | ICD-10-CM | POA: Diagnosis not present

## 2015-04-23 DIAGNOSIS — S0990XA Unspecified injury of head, initial encounter: Secondary | ICD-10-CM

## 2015-04-23 DIAGNOSIS — Z79899 Other long term (current) drug therapy: Secondary | ICD-10-CM | POA: Diagnosis not present

## 2015-04-23 DIAGNOSIS — Y998 Other external cause status: Secondary | ICD-10-CM | POA: Insufficient documentation

## 2015-04-23 DIAGNOSIS — S0992XA Unspecified injury of nose, initial encounter: Secondary | ICD-10-CM | POA: Diagnosis not present

## 2015-04-23 DIAGNOSIS — Y9389 Activity, other specified: Secondary | ICD-10-CM | POA: Insufficient documentation

## 2015-04-23 DIAGNOSIS — Z8719 Personal history of other diseases of the digestive system: Secondary | ICD-10-CM | POA: Diagnosis not present

## 2015-04-23 DIAGNOSIS — F200 Paranoid schizophrenia: Secondary | ICD-10-CM | POA: Diagnosis not present

## 2015-04-23 DIAGNOSIS — Z8572 Personal history of non-Hodgkin lymphomas: Secondary | ICD-10-CM | POA: Insufficient documentation
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
## 2015-04-23 NOTE — Discharge Instructions (Signed)
May take tylenol or motrin as needed headache/facial pain. Follow-up with your regular doctor. Return here for new concerns.
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
## 2015-04-24 NOTE — ED Notes (Signed)
See downtown forms for charting during downtime

## 2015-05-09 ENCOUNTER — Encounter (HOSPITAL_COMMUNITY): Payer: Self-pay | Admitting: Emergency Medicine

## 2015-05-09 ENCOUNTER — Other Ambulatory Visit: Payer: Self-pay

## 2015-05-09 ENCOUNTER — Emergency Department (HOSPITAL_COMMUNITY)
Admission: EM | Admit: 2015-05-09 | Discharge: 2015-05-09 | Disposition: A | Discharge status: Home / Self Care | Payer: Medicare Other | Attending: Emergency Medicine | Admitting: Emergency Medicine

## 2015-05-09 DIAGNOSIS — Z8719 Personal history of other diseases of the digestive system: Secondary | ICD-10-CM | POA: Insufficient documentation

## 2015-05-09 DIAGNOSIS — Z8572 Personal history of non-Hodgkin lymphomas: Secondary | ICD-10-CM | POA: Diagnosis not present

## 2015-05-09 DIAGNOSIS — F200 Paranoid schizophrenia: Secondary | ICD-10-CM | POA: Insufficient documentation

## 2015-05-09 DIAGNOSIS — R55 Syncope and collapse: Secondary | ICD-10-CM | POA: Diagnosis not present

## 2015-05-09 DIAGNOSIS — Z7982 Long term (current) use of aspirin: Secondary | ICD-10-CM | POA: Insufficient documentation

## 2015-05-09 DIAGNOSIS — N189 Chronic kidney disease, unspecified: Secondary | ICD-10-CM | POA: Diagnosis not present

## 2015-05-09 DIAGNOSIS — Z79899 Other long term (current) drug therapy: Secondary | ICD-10-CM | POA: Diagnosis not present

## 2015-05-09 DIAGNOSIS — Z856 Personal history of leukemia: Secondary | ICD-10-CM | POA: Insufficient documentation

## 2015-05-09 LAB — URINALYSIS, ROUTINE W REFLEX MICROSCOPIC
Bilirubin Urine: NEGATIVE
Glucose, UA: NEGATIVE mg/dL
Hgb urine dipstick: NEGATIVE
Ketones, ur: NEGATIVE mg/dL
Leukocytes, UA: NEGATIVE
Nitrite: NEGATIVE
Protein, ur: NEGATIVE mg/dL
Specific Gravity, Urine: 1.01 (ref 1.005–1.030)
Urobilinogen, UA: 0.2 mg/dL (ref 0.0–1.0)
pH: 5.5 (ref 5.0–8.0)

## 2015-05-09 LAB — BASIC METABOLIC PANEL
Anion gap: 8 (ref 5–15)
BUN: 17 mg/dL (ref 6–20)
CO2: 23 mmol/L (ref 22–32)
Calcium: 9.8 mg/dL (ref 8.9–10.3)
Chloride: 110 mmol/L (ref 101–111)
Creatinine, Ser: 1.75 mg/dL — ABNORMAL HIGH (ref 0.61–1.24)
GFR calc Af Amer: 46 mL/min — ABNORMAL LOW (ref >60)
GFR calc non Af Amer: 40 mL/min — ABNORMAL LOW (ref >60)
Glucose, Bld: 128 mg/dL — ABNORMAL HIGH (ref 65–99)
Potassium: 4.5 mmol/L (ref 3.5–5.1)
Sodium: 141 mmol/L (ref 135–145)

## 2015-05-09 LAB — CBC WITH DIFFERENTIAL/PLATELET
Basophils Absolute: 0 10*3/uL (ref 0.0–0.1)
Basophils Relative: 0 % (ref 0–1)
Eosinophils Absolute: 0.1 10*3/uL (ref 0.0–0.7)
Eosinophils Relative: 1 % (ref 0–5)
HCT: 40.4 % (ref 39.0–52.0)
Hemoglobin: 13.4 g/dL (ref 13.0–17.0)
Lymphocytes Relative: 15 % (ref 12–46)
Lymphs Abs: 1.3 10*3/uL (ref 0.7–4.0)
MCH: 30 pg (ref 26.0–34.0)
MCHC: 33.2 g/dL (ref 30.0–36.0)
MCV: 90.6 fL (ref 78.0–100.0)
Monocytes Absolute: 0.9 10*3/uL (ref 0.1–1.0)
Monocytes Relative: 9 % (ref 3–12)
Neutro Abs: 6.8 10*3/uL (ref 1.7–7.7)
Neutrophils Relative %: 75 % (ref 43–77)
Platelets: 127 10*3/uL — ABNORMAL LOW (ref 150–400)
RBC: 4.46 MIL/uL (ref 4.22–5.81)
RDW: 13.7 % (ref 11.5–15.5)
WBC: 9.2 10*3/uL (ref 4.0–10.5)

## 2015-05-09 MED ORDER — SODIUM CHLORIDE 0.9 % IV BOLUS (SEPSIS)
1000.0000 mL | Freq: Once | INTRAVENOUS | Status: AC
  Administered 2015-05-09: 1000 mL via INTRAVENOUS

## 2015-05-09 NOTE — ED Provider Notes (Signed)
Medical screening examination/treatment/procedure(s) were conducted as a shared visit with non-physician practitioner(s) and myself.  I personally evaluated the patient during the encounter.   EKG Interpretation   Date/Time:  Thursday May 09 2015 15:26:20 EDT Ventricular Rate:  83 PR Interval:  181 QRS Duration: 165 QT Interval:  423 QTC Calculation: 497 R Axis:   38 Text Interpretation:  Sinus tachycardia Ventricular premature complex  Right bundle branch block No significant change since last tracing  Confirmed by Ivania Teagarden  MD-J, Shigeru Lampert [id number]) on 05/09/2015 3:55:57 PM      Pt had a syncopal episode after a blood draw at the doctors office today.  Pt denies complaints in the ED.  Labs reassuring.  Pt monitored.    At this time there does not appear to be any evidence of an acute emergency medical condition and the patient appears stable for discharge with appropriate outpatient follow up.   Dorie Rank, MD 05/09/15 Bosie Helper

## 2015-05-09 NOTE — Discharge Instructions (Signed)
Return here as needed.  Follow-up with your primary care doctor °

## 2015-05-09 NOTE — ED Provider Notes (Signed)
CSN: [id number]     Arrival date & time 05/09/15  1518 History   First MD Initiated Contact with Patient 05/09/15 1522     Chief Complaint  Patient presents with  . Near Syncope     (Consider location/radiation/quality/duration/timing/severity/associated sxs/prior Treatment) HPI Patient presents to the emergency department with a near syncopal episode that occurred after a blood draw at his doctor's office.  The patient was with his caregiver.  When she states that after the blood draw that he started to feel faint and his color became pretty pale.  Caregiver also states that he has not eaten since early this morning.  Patient denies chest pain, shortness of breath, weakness, dizziness, headache, blurred vision, back pain, neck pain, fever, incontinence, or syncope.  The patient is feeling some better upon arrival here in the emergency department Past Medical History  Diagnosis Date  . Chronic kidney disease 09/20/2008  . Paranoid schizophrenia   . Hiatal hernia   . NHL (non-Hodgkin's lymphoma) dx'd 08/2008 rt groin    chemo comp 08/2008  . CLL (chronic lymphoblastic leukemia) 10/2003  . cll dx'd 10/2003    no rx  . Non Hodgkin's lymphoma 08/2008  . GERD (gastroesophageal reflux disease)    History reviewed. No pertinent past surgical history. Family History  Problem Relation Age of Onset  . Alcohol abuse Father    History  Substance Use Topics  . Smoking status: Never Smoker   . Smokeless tobacco: Not on file  . Alcohol Use: No    Review of Systems   All other systems negative except as documented in the HPI. All pertinent positives and negatives as reviewed in the HPI. Allergies  Review of patient's allergies indicates no known allergies.  Home Medications   Prior to Admission medications   Medication Sig Start Date End Date Taking? Authorizing Provider  aspirin EC 81 MG tablet Take 81 mg by mouth daily.   Yes Historical Provider, MD  benztropine (COGENTIN) 0.5 MG  tablet Take 0.5 mg by mouth daily.    Yes Historical Provider, MD  chlorproMAZINE (THORAZINE) 25 MG tablet Take 1 tablet (25 mg total) by mouth 2 (two) times daily. 04/11/15  Yes Robbie Lis, MD  cholecalciferol (VITAMIN D) 1000 UNITS tablet Take 1,000 Units by mouth daily.   Yes Historical Provider, MD  cloZAPine (CLOZARIL) 100 MG tablet Take 1 tablet (100 mg total) by mouth 2 (two) times daily. 04/11/15  Yes Robbie Lis, MD  docusate sodium (COLACE) 100 MG capsule Take 100 mg by mouth daily.   Yes Historical Provider, MD  doxycycline (VIBRAMYCIN) 100 MG capsule Take 100 mg by mouth 2 (two) times daily. Started 05/01/15, for 10 days ending 05/10/15   Yes Historical Provider, MD  escitalopram (LEXAPRO) 10 MG tablet Take 10 mg by mouth daily before breakfast.    Yes Historical Provider, MD  ezetimibe (ZETIA) 10 MG tablet Take 10 mg by mouth daily.   Yes Historical Provider, MD  ketoconazole (NIZORAL) 2 % cream Apply 1 application topically 2 (two) times daily.    Yes Historical Provider, MD  lamoTRIgine (LAMICTAL) 25 MG tablet Take 50 mg by mouth 2 (two) times daily.    Yes Historical Provider, MD  levothyroxine (SYNTHROID, LEVOTHROID) 100 MCG tablet Take 100 mcg by mouth daily before breakfast.    Yes Historical Provider, MD  mineral oil liquid Place into the right ear once a week. Instill 2 drops into both ears once weekly   Yes Historical  Provider, MD  Omega-3 Fatty Acids (FISH OIL) 1000 MG CAPS Take 4,000 mg by mouth daily.   Yes Historical Provider, MD  polyethylene glycol (MIRALAX / GLYCOLAX) packet Take 17 g by mouth daily.   Yes Historical Provider, MD  senna (SENOKOT) 8.6 MG tablet Take 1 tablet by mouth daily.   Yes Historical Provider, MD  clindamycin (CLEOCIN) 150 MG capsule Take 1 capsule (150 mg total) by mouth 3 (three) times daily. 04/11/15   Robbie Lis, MD   BP 116/54 mmHg  Pulse 71  Temp(Src) 97.6 F (36.4 C) (Oral)  Resp 13  Ht 5\' 10"  (1.778 m)  Wt 288 lb (130.636 kg)  BMI  41.32 kg/m2  SpO2 100% Physical Exam  Constitutional: He is oriented to person, place, and time. He appears well-developed and well-nourished. No distress.  HENT:  Head: Normocephalic and atraumatic.  Mouth/Throat: Oropharynx is clear and moist.  Eyes: Pupils are equal, round, and reactive to light.  Neck: Normal range of motion. Neck supple.  Cardiovascular: Normal rate, regular rhythm and normal heart sounds.  Exam reveals no gallop and no friction rub.   No murmur heard. Pulmonary/Chest: Effort normal and breath sounds normal. No respiratory distress.  Musculoskeletal: He exhibits no edema.  Neurological: He is alert and oriented to person, place, and time. He exhibits normal muscle tone. Coordination normal.  Skin: Skin is warm and dry. No rash noted. No erythema.  Psychiatric: He has a normal mood and affect. His behavior is normal.  Nursing note and vitals reviewed.   ED Course  Procedures (including critical care time) Labs Review Labs Reviewed  CBC WITH DIFFERENTIAL/PLATELET - Abnormal; Notable for the following:    Platelets 127 (*)    All other components within normal limits  BASIC METABOLIC PANEL - Abnormal; Notable for the following:    Glucose, Bld 128 (*)    Creatinine, Ser 1.75 (*)    GFR calc non Af Amer 40 (*)    GFR calc Af Amer 46 (*)    All other components within normal limits  URINALYSIS, ROUTINE W REFLEX MICROSCOPIC (NOT AT [facility])    Imaging Review No results found.   EKG Interpretation   Date/Time:  Thursday May 09 2015 15:26:20 EDT Ventricular Rate:  83 PR Interval:  181 QRS Duration: 165 QT Interval:  423 QTC Calculation: 497 R Axis:   38 Text Interpretation:  Sinus tachycardia Ventricular premature complex  Right bundle branch block No significant change since last tracing  Confirmed by KNAPP  MD-J, JON [id number]) on 05/09/2015 3:55:57 PM      Patient is given IV fluids.  He is feeling better at this time, we will have the patient  follow-up with his primary care doctor, so most likely a vagal response to the drawing of blood and not having anything to eat since very early this morning.  Told to return here as needed    Dalia Heading, PA-C 05/09/15 1827
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
## 2015-05-15 ENCOUNTER — Emergency Department (HOSPITAL_COMMUNITY): Payer: Medicare Other

## 2015-05-15 ENCOUNTER — Encounter (HOSPITAL_COMMUNITY): Payer: Self-pay | Admitting: Emergency Medicine

## 2015-05-15 ENCOUNTER — Emergency Department (HOSPITAL_COMMUNITY)
Admission: EM | Admit: 2015-05-15 | Discharge: 2015-05-17 | Disposition: A | Discharge status: Home / Self Care | Payer: Medicare Other | Attending: Emergency Medicine | Admitting: Emergency Medicine

## 2015-05-15 DIAGNOSIS — K219 Gastro-esophageal reflux disease without esophagitis: Secondary | ICD-10-CM | POA: Insufficient documentation

## 2015-05-15 DIAGNOSIS — F200 Paranoid schizophrenia: Secondary | ICD-10-CM | POA: Diagnosis not present

## 2015-05-15 DIAGNOSIS — R61 Generalized hyperhidrosis: Secondary | ICD-10-CM | POA: Diagnosis not present

## 2015-05-15 DIAGNOSIS — T6592XA Toxic effect of unspecified substance, intentional self-harm, initial encounter: Secondary | ICD-10-CM | POA: Insufficient documentation

## 2015-05-15 DIAGNOSIS — Z7982 Long term (current) use of aspirin: Secondary | ICD-10-CM | POA: Insufficient documentation

## 2015-05-15 DIAGNOSIS — Z8572 Personal history of non-Hodgkin lymphomas: Secondary | ICD-10-CM | POA: Diagnosis not present

## 2015-05-15 DIAGNOSIS — I472 Ventricular tachycardia: Secondary | ICD-10-CM | POA: Diagnosis not present

## 2015-05-15 DIAGNOSIS — N189 Chronic kidney disease, unspecified: Secondary | ICD-10-CM | POA: Diagnosis not present

## 2015-05-15 DIAGNOSIS — G319 Degenerative disease of nervous system, unspecified: Secondary | ICD-10-CM | POA: Diagnosis not present

## 2015-05-15 DIAGNOSIS — Z9221 Personal history of antineoplastic chemotherapy: Secondary | ICD-10-CM | POA: Insufficient documentation

## 2015-05-15 DIAGNOSIS — Z792 Long term (current) use of antibiotics: Secondary | ICD-10-CM | POA: Insufficient documentation

## 2015-05-15 DIAGNOSIS — Z79899 Other long term (current) drug therapy: Secondary | ICD-10-CM | POA: Diagnosis not present

## 2015-05-15 DIAGNOSIS — D72829 Elevated white blood cell count, unspecified: Secondary | ICD-10-CM | POA: Insufficient documentation

## 2015-05-15 DIAGNOSIS — Z856 Personal history of leukemia: Secondary | ICD-10-CM | POA: Insufficient documentation

## 2015-05-15 DIAGNOSIS — F911 Conduct disorder, childhood-onset type: Secondary | ICD-10-CM | POA: Diagnosis present

## 2015-05-15 DIAGNOSIS — Z8719 Personal history of other diseases of the digestive system: Secondary | ICD-10-CM | POA: Insufficient documentation

## 2015-05-15 DIAGNOSIS — R4182 Altered mental status, unspecified: Secondary | ICD-10-CM

## 2015-05-15 DIAGNOSIS — F209 Schizophrenia, unspecified: Secondary | ICD-10-CM | POA: Diagnosis present

## 2015-05-15 LAB — COMPREHENSIVE METABOLIC PANEL
ALK PHOS: 91 U/L (ref 38–126)
ALT: 18 U/L (ref 17–63)
AST: 22 U/L (ref 15–41)
Albumin: 4.6 g/dL (ref 3.5–5.0)
Anion gap: 12 (ref 5–15)
BUN: 19 mg/dL (ref 6–20)
CHLORIDE: 104 mmol/L (ref 101–111)
CO2: 24 mmol/L (ref 22–32)
CREATININE: 2 mg/dL — AB (ref 0.61–1.24)
Calcium: 10.1 mg/dL (ref 8.9–10.3)
GFR calc Af Amer: 39 mL/min — ABNORMAL LOW (ref >60)
GFR calc non Af Amer: 34 mL/min — ABNORMAL LOW (ref >60)
GLUCOSE: 175 mg/dL — AB (ref 65–99)
Potassium: 4.3 mmol/L (ref 3.5–5.1)
Sodium: 140 mmol/L (ref 135–145)
Total Bilirubin: 0.7 mg/dL (ref 0.3–1.2)
Total Protein: 7.4 g/dL (ref 6.5–8.1)

## 2015-05-15 LAB — CBC WITH DIFFERENTIAL/PLATELET
BASOS ABS: 0 10*3/uL (ref 0.0–0.1)
BASOS PCT: 0 % (ref 0–1)
Eosinophils Absolute: 0.2 10*3/uL (ref 0.0–0.7)
Eosinophils Relative: 1 % (ref 0–5)
HCT: 44.3 % (ref 39.0–52.0)
Hemoglobin: 14.7 g/dL (ref 13.0–17.0)
Lymphocytes Relative: 10 % — ABNORMAL LOW (ref 12–46)
Lymphs Abs: 1.6 10*3/uL (ref 0.7–4.0)
MCH: 30.4 pg (ref 26.0–34.0)
MCHC: 33.2 g/dL (ref 30.0–36.0)
MCV: 91.5 fL (ref 78.0–100.0)
Monocytes Absolute: 0.9 10*3/uL (ref 0.1–1.0)
Monocytes Relative: 6 % (ref 3–12)
NEUTROS ABS: 13.2 10*3/uL — AB (ref 1.7–7.7)
NEUTROS PCT: 83 % — AB (ref 43–77)
Platelets: 180 10*3/uL (ref 150–400)
RBC: 4.84 MIL/uL (ref 4.22–5.81)
RDW: 13.4 % (ref 11.5–15.5)
WBC: 16 10*3/uL — ABNORMAL HIGH (ref 4.0–10.5)

## 2015-05-15 LAB — ACETAMINOPHEN LEVEL

## 2015-05-15 LAB — TROPONIN I

## 2015-05-15 LAB — SALICYLATE LEVEL: Salicylate Lvl: <4 mg/dL (ref 2.8–30.0)

## 2015-05-15 LAB — ETHANOL: Alcohol, Ethyl (B): <5 mg/dL (ref <5)

## 2015-05-15 MED ORDER — BENZTROPINE MESYLATE 1 MG PO TABS
0.5000 mg | ORAL_TABLET | Freq: Every day | ORAL | Status: DC
  Administered 2015-05-16 – 2015-05-17 (×2): 0.5 mg via ORAL
  Filled 2015-05-15 (×2): qty 1

## 2015-05-15 MED ORDER — ZOLPIDEM TARTRATE 5 MG PO TABS
5.0000 mg | ORAL_TABLET | Freq: Every evening | ORAL | Status: DC | PRN
  Administered 2015-05-16: 5 mg via ORAL
  Filled 2015-05-15: qty 1

## 2015-05-15 MED ORDER — IBUPROFEN 200 MG PO TABS: 600.0000 mg | ORAL_TABLET | Freq: Three times a day (TID) | ORAL | Status: DC | PRN

## 2015-05-15 MED ORDER — DOCUSATE SODIUM 100 MG PO CAPS
100.0000 mg | ORAL_CAPSULE | Freq: Every day | ORAL | Status: DC
  Administered 2015-05-16: 100 mg via ORAL
  Filled 2015-05-15: qty 1

## 2015-05-15 MED ORDER — LEVOTHYROXINE SODIUM 100 MCG PO TABS
100.0000 ug | ORAL_TABLET | Freq: Every day | ORAL | Status: DC
  Administered 2015-05-16 – 2015-05-17 (×2): 100 ug via ORAL
  Filled 2015-05-15 (×3): qty 1

## 2015-05-15 MED ORDER — NICOTINE 21 MG/24HR TD PT24
21.0000 mg | MEDICATED_PATCH | Freq: Every day | TRANSDERMAL | Status: DC
  Filled 2015-05-15: qty 1

## 2015-05-15 MED ORDER — ASPIRIN EC 81 MG PO TBEC
81.0000 mg | DELAYED_RELEASE_TABLET | Freq: Every day | ORAL | Status: DC
  Administered 2015-05-16 – 2015-05-17 (×2): 81 mg via ORAL
  Filled 2015-05-15 (×3): qty 1

## 2015-05-15 MED ORDER — SULFAMETHOXAZOLE-TRIMETHOPRIM 800-160 MG PO TABS
1.0000 | ORAL_TABLET | Freq: Two times a day (BID) | ORAL | Status: DC
  Administered 2015-05-15 – 2015-05-17 (×4): 1 via ORAL
  Filled 2015-05-15 (×4): qty 1

## 2015-05-15 MED ORDER — ACETAMINOPHEN 325 MG PO TABS
650.0000 mg | ORAL_TABLET | ORAL | Status: DC | PRN
  Administered 2015-05-16: 650 mg via ORAL
  Filled 2015-05-15: qty 2

## 2015-05-15 MED ORDER — ONDANSETRON HCL 4 MG PO TABS: 4.0000 mg | ORAL_TABLET | Freq: Three times a day (TID) | ORAL | Status: DC | PRN

## 2015-05-15 MED ORDER — LAMOTRIGINE 25 MG PO TABS
50.0000 mg | ORAL_TABLET | Freq: Two times a day (BID) | ORAL | Status: DC
  Administered 2015-05-16 – 2015-05-17 (×4): 50 mg via ORAL
  Filled 2015-05-15 (×6): qty 2

## 2015-05-15 MED ORDER — LORAZEPAM 1 MG PO TABS
1.0000 mg | ORAL_TABLET | Freq: Three times a day (TID) | ORAL | Status: DC | PRN
  Administered 2015-05-16 (×2): 1 mg via ORAL
  Filled 2015-05-15 (×2): qty 1

## 2015-05-15 MED ORDER — EZETIMIBE 10 MG PO TABS
10.0000 mg | ORAL_TABLET | Freq: Every day | ORAL | Status: DC
  Administered 2015-05-16 – 2015-05-17 (×2): 10 mg via ORAL
  Filled 2015-05-15 (×3): qty 1

## 2015-05-15 MED ORDER — ALUM & MAG HYDROXIDE-SIMETH 200-200-20 MG/5ML PO SUSP: 30.0000 mL | ORAL | Status: DC | PRN

## 2015-05-15 MED ORDER — ESCITALOPRAM OXALATE 10 MG PO TABS
10.0000 mg | ORAL_TABLET | Freq: Every day | ORAL | Status: DC
  Administered 2015-05-16 – 2015-05-17 (×2): 10 mg via ORAL
  Filled 2015-05-15 (×2): qty 1

## 2015-05-15 MED ORDER — CEPHALEXIN 500 MG PO CAPS
500.0000 mg | ORAL_CAPSULE | Freq: Three times a day (TID) | ORAL | Status: DC
  Administered 2015-05-15 – 2015-05-17 (×6): 500 mg via ORAL
  Filled 2015-05-15 (×6): qty 1
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
## 2015-05-15 NOTE — ED Notes (Signed)
Pt placed in paper scrubs and pt belongings removed from room.

## 2015-05-15 NOTE — ED Notes (Addendum)
TTS in process, and caregiver at bedside.

## 2015-05-15 NOTE — ED Notes (Signed)
Bed: [facility] Expected date:  Expected time:  Means of arrival:  Comments: [employer] psych pt
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
## 2015-05-15 NOTE — ED Notes (Signed)
Pt moved back to Acute RM 16 for apparent VT in triage. Officers report he would slump a little, holding his head down and become diaphoretic. Pt placed on monitor. N o

## 2015-05-15 NOTE — ED Notes (Signed)
Pt is calm and cooperative. Pt is taking to sitter at bedside.
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
## 2015-05-16 DIAGNOSIS — F200 Paranoid schizophrenia: Secondary | ICD-10-CM

## 2015-05-16 LAB — RAPID URINE DRUG SCREEN, HOSP PERFORMED
Amphetamines: NOT DETECTED
BARBITURATES: NOT DETECTED
Benzodiazepines: NOT DETECTED
COCAINE: NOT DETECTED
Opiates: NOT DETECTED
Tetrahydrocannabinol: NOT DETECTED

## 2015-05-16 MED ORDER — CHLORPROMAZINE HCL 25 MG PO TABS
50.0000 mg | ORAL_TABLET | Freq: Two times a day (BID) | ORAL | Status: DC
  Administered 2015-05-16 – 2015-05-17 (×2): 50 mg via ORAL
  Filled 2015-05-16 (×2): qty 2

## 2015-05-16 NOTE — ED Notes (Signed)
Patient is asleep.  Unable to collect urine sample.

## 2015-05-16 NOTE — Consult Note (Signed)
[facility] Consult   Reason for Consult:  Paranoid Schizophrenia,  Referring Physician:  EDP Patient Identification: [name] MRN:  [id number] Principal Diagnosis: Schizophrenia Diagnosis:   Patient Active Problem List   Diagnosis Date Noted  . Schizophrenia [F20.9] 12/24/2012    Priority: High  . Cellulitis of right leg [L03.115] 04/06/2015  . Cellulitis [L03.90] 04/06/2015  . Cellulitis of right lower extremity [L03.115] 03/28/2015  . Sepsis [A41.9] 03/27/2015  . Prolonged QT interval [I45.81]   . Exertional dyspnea [R06.09] 05/29/2014  . CLL (chronic lymphocytic leukemia) [C91.10] 09/12/2013  . Non Hodgkin's lymphoma [C85.90] 09/12/2013  . CAP (community acquired pneumonia) [J18.9] 12/24/2012  . Leukocytosis [D72.829] 12/24/2012  . Anemia [D64.9] 12/24/2012  . CKD (chronic kidney disease), stage III [N18.3] 12/24/2012    Total Time spent with patient: 45 minutes  Subjective:   [name] is a 64 y.o. male patient admitted with Paranoid Schizophrenia.  HPI:  Caucasian male, 64 years old was evaluated after he was brought in by [facility] from a [employer].  Patient ingested unknown amount of a pill, left his [facility] and went to a [employer].  On getting to the [employer] patient started threatening people and asked to buy a gun and go back to his group home to kill people.  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  HPI Elements:   Location:  Paranoid Schizophrenia, . Quality:  severe. Severity:  severe. Timing:  Acute. Duration:  Chronic mental illness. Context:  Brought in by [facility] after OD and agitation..  Past Medical History:   Past Medical History  Diagnosis Date  . Chronic kidney disease 09/20/2008  . Paranoid schizophrenia   . Hiatal hernia   . NHL (non-Hodgkin's lymphoma) dx'd 08/2008 rt groin    chemo comp 08/2008  . CLL (chronic lymphoblastic leukemia) 10/2003  . cll dx'd 10/2003    no rx  . Non Hodgkin's lymphoma 08/2008  . GERD (gastroesophageal reflux disease)    History reviewed. No pertinent past surgical history. Family History:  Family History  Problem Relation Age of Onset  . Alcohol abuse Father    Social History:  History  Alcohol Use No     History  Drug Use No    History   Social History  . Marital Status: Single    Spouse Name: N/A  . Number of Children: N/A  . Years of Education: N/A   Social History Main Topics  . Smoking status: Never Smoker   . Smokeless tobacco: Not on file  . Alcohol Use: No  . Drug Use: No  . Sexual Activity: Not on file   Other Topics Concern  . None   Social History Narrative   Additional Social History:    Prescriptions: See PTA list History of alcohol / drug use?: No history of alcohol / drug abuse                     Allergies:  No Known Allergies  Labs:  Results for orders placed or performed during the hospital encounter of 05/15/15 (from the past 48 hour(s))  CBC WITH DIFFERENTIAL  Status: Abnormal   Collection Time: 05/15/15  6:29 PM  Result Value Ref Range   WBC 16.0 (H) 4.0 - 10.5 K/uL   RBC 4.84 4.22 - 5.81 MIL/uL   Hemoglobin 14.7 13.0 - 17.0 g/dL   HCT 44.3 39.0 - 52.0 %   MCV 91.5 78.0 - 100.0 fL   MCH 30.4 26.0 - 34.0 pg   MCHC 33.2 30.0 - 36.0 g/dL   RDW 13.4 11.5 - 15.5 %   Platelets 180 150 - 400 K/uL   Neutrophils Relative % 83 (H) 43 - 77 %   Neutro Abs 13.2 (H) 1.7 - 7.7 K/uL   Lymphocytes Relative 10 (L) 12 - 46 %   Lymphs Abs 1.6 0.7 - 4.0 K/uL   Monocytes Relative 6 3 - 12 %   Monocytes Absolute 0.9 0.1 - 1.0 K/uL   Eosinophils Relative 1 0 - 5 %   Eosinophils Absolute 0.2 0.0 - 0.7 K/uL    Basophils Relative 0 0 - 1 %   Basophils Absolute 0.0 0.0 - 0.1 K/uL  Comprehensive metabolic panel     Status: Abnormal   Collection Time: 05/15/15  6:29 PM  Result Value Ref Range   Sodium 140 135 - 145 mmol/L   Potassium 4.3 3.5 - 5.1 mmol/L   Chloride 104 101 - 111 mmol/L   CO2 24 22 - 32 mmol/L   Glucose, Bld 175 (H) 65 - 99 mg/dL   BUN 19 6 - 20 mg/dL   Creatinine, Ser 2.00 (H) 0.61 - 1.24 mg/dL   Calcium 10.1 8.9 - 10.3 mg/dL   Total Protein 7.4 6.5 - 8.1 g/dL   Albumin 4.6 3.5 - 5.0 g/dL   AST 22 15 - 41 U/L   ALT 18 17 - 63 U/L   Alkaline Phosphatase 91 38 - 126 U/L   Total Bilirubin 0.7 0.3 - 1.2 mg/dL   GFR calc non Af Amer 34 (L) >60 mL/min   GFR calc Af Amer 39 (L) >60 mL/min    Comment: (NOTE) The eGFR has been calculated using the CKD EPI equation. This calculation has not been validated in all clinical situations. eGFR's persistently <60 mL/min signify possible Chronic Kidney Disease.    Anion gap 12 5 - 15  Ethanol     Status: None   Collection Time: 05/15/15  6:29 PM  Result Value Ref Range   Alcohol, Ethyl (B) <5 <5 mg/dL    Comment:        LOWEST DETECTABLE LIMIT FOR SERUM ALCOHOL IS 5 mg/dL FOR MEDICAL PURPOSES ONLY   Acetaminophen level     Status: Abnormal   Collection Time: 05/15/15  6:29 PM  Result Value Ref Range   Acetaminophen (Tylenol), Serum <10 (L) 10 - 30 ug/mL    Comment:        THERAPEUTIC CONCENTRATIONS VARY SIGNIFICANTLY. A RANGE OF 10-30 ug/mL MAY BE AN EFFECTIVE CONCENTRATION FOR MANY PATIENTS. HOWEVER, SOME ARE BEST TREATED AT CONCENTRATIONS OUTSIDE THIS RANGE. ACETAMINOPHEN CONCENTRATIONS >150 ug/mL AT 4 HOURS AFTER INGESTION AND >50 ug/mL AT 12 HOURS AFTER INGESTION ARE OFTEN ASSOCIATED WITH TOXIC REACTIONS.   Salicylate level     Status: None   Collection Time: 05/15/15  6:29 PM  Result Value Ref Range   Salicylate Lvl <0.0 2.8 - 30.0 mg/dL  Troponin I     Status: None   Collection Time: 05/15/15  6:35 PM   Result Value Ref Range   Troponin I <0.03 <0.031 ng/mL  Comment:        NO INDICATION OF MYOCARDIAL INJURY.     Vitals: Blood pressure 121/66, pulse 90, temperature 98.6 F (37 C), temperature source Oral, resp. rate 18, SpO2 98 %.  Risk to Self: Suicidal Ideation: No (denies) Suicidal Intent: No (denies) Is patient at risk for suicide?:  (denies) Suicidal Plan?: No (denies) Access to Means: No What has been your use of drugs/alcohol within the last 12 months?: none How many times?: 0 Other Self Harm Risks: none noted Triggers for Past Attempts:  (NONE NOTED) Intentional Self Injurious Behavior: None Risk to Others: Homicidal Ideation: No (denies) Thoughts of Harm to Others: Yes-Currently Present (pt sts he wants to "defend himself" from others after him) Comment - Thoughts of Harm to Others: pt is having delusion others are after him to harm him (sts devil, a Mafia boss and Gingus Humphrey Rolls want to hurt him) Current Homicidal Intent: No (denies) Current Homicidal Plan: No (denies) Access to Homicidal Means: No Identified Victim: Engineer, maintenance, resident at [employer] (Per Guardian, [facility] aware) History of harm to others?: No (none noted) Assessment of Violence: None Noted Violent Behavior Description: None noted per Guardian Does patient have access to weapons?: No Criminal Charges Pending?: No Does patient have a court date: No Prior Inpatient Therapy: Prior Inpatient Therapy: Yes Prior Therapy Dates: multiple Prior Therapy Facilty/Provider(s): multiple Reason for Treatment: paranoid schizaphrenia Prior Outpatient Therapy: Prior Outpatient Therapy: No Prior Therapy Dates: na Prior Therapy Facilty/Provider(s): na Reason for Treatment: na Does patient have an ACCT team?: No Does patient have Intensive In-House Services?  : No Does patient have [facility] services? : No Does patient have P4CC services?: No  Current Facility-Administered Medications  Medication Dose Route Frequency  Provider Last Rate Last Dose  . acetaminophen (TYLENOL) tablet 650 mg  650 mg Oral Q4H PRN Evelina Bucy, MD      . alum & mag hydroxide-simeth (MAALOX/MYLANTA) 200-200-20 MG/5ML suspension 30 mL  30 mL Oral PRN Evelina Bucy, MD      . aspirin EC tablet 81 mg  81 mg Oral Daily Evelina Bucy, MD   81 mg at 05/16/15 1009  . benztropine (COGENTIN) tablet 0.5 mg  0.5 mg Oral Daily Evelina Bucy, MD   0.5 mg at 05/16/15 1009  . cephALEXin (KEFLEX) capsule 500 mg  500 mg Oral 3 times per day Evelina Bucy, MD   500 mg at 05/16/15 1312  . chlorproMAZINE (THORAZINE) tablet 50 mg  50 mg Oral BID PC Gordon Vandunk      . docusate sodium (COLACE) capsule 100 mg  100 mg Oral Daily Evelina Bucy, MD   100 mg at 05/16/15 1009  . escitalopram (LEXAPRO) tablet 10 mg  10 mg Oral QAC breakfast Evelina Bucy, MD   10 mg at 05/16/15 0848  . ezetimibe (ZETIA) tablet 10 mg  10 mg Oral Daily Evelina Bucy, MD   10 mg at 05/16/15 1009  . ibuprofen (ADVIL,MOTRIN) tablet 600 mg  600 mg Oral Q8H PRN Evelina Bucy, MD      . lamoTRIgine (LAMICTAL) tablet 50 mg  50 mg Oral BID Evelina Bucy, MD   50 mg at 05/16/15 1009  . levothyroxine (SYNTHROID, LEVOTHROID) tablet 100 mcg  100 mcg Oral QAC breakfast Evelina Bucy, MD   100 mcg at 05/16/15 0848  . LORazepam (ATIVAN) tablet 1 mg  1 mg Oral Q8H PRN Evelina Bucy, MD   1 mg at 05/16/15 0005  . nicotine (NICODERM CQ - dosed in mg/24  hours) patch 21 mg  21 mg Transdermal Daily Evelina Bucy, MD   21 mg at 05/16/15 1011  . sulfamethoxazole-trimethoprim (BACTRIM DS,SEPTRA DS) 800-160 MG per tablet 1 tablet  1 tablet Oral Q12H Evelina Bucy, MD   1 tablet at 05/16/15 1009   Current Outpatient Prescriptions  Medication Sig Dispense Refill  . aspirin EC 81 MG tablet Take 81 mg by mouth daily.    . benztropine (COGENTIN) 0.5 MG tablet Take 0.5 mg by mouth daily.     . chlorproMAZINE (THORAZINE) 50 MG tablet Take 50 mg by mouth 2 (two) times daily.    . cholecalciferol (VITAMIN D) 1000 UNITS  tablet Take 1,000 Units by mouth daily.    Marland Kitchen docusate sodium (COLACE) 100 MG capsule Take 100 mg by mouth daily.    Marland Kitchen escitalopram (LEXAPRO) 10 MG tablet Take 10 mg by mouth daily before breakfast.     . ezetimibe (ZETIA) 10 MG tablet Take 10 mg by mouth daily.    Marland Kitchen ketoconazole (NIZORAL) 2 % cream Apply 1 application topically 2 (two) times daily.     Marland Kitchen lamoTRIgine (LAMICTAL) 25 MG tablet Take 50 mg by mouth 2 (two) times daily.     Marland Kitchen levothyroxine (SYNTHROID, LEVOTHROID) 100 MCG tablet Take 100 mcg by mouth daily before breakfast.     . mineral oil liquid Place into the right ear once a week. Instill 1-2 drops into the right ear once weekly.    . Omega-3 Fatty Acids (FISH OIL) 1000 MG CAPS Take 4,000 mg by mouth daily.    . polyethylene glycol (MIRALAX / GLYCOLAX) packet Take 17 g by mouth daily.    Marland Kitchen senna (SENOKOT) 8.6 MG tablet Take 1 tablet by mouth daily.    . chlorproMAZINE (THORAZINE) 25 MG tablet Take 1 tablet (25 mg total) by mouth 2 (two) times daily. (Patient not taking: Reported on 05/15/2015) 60 tablet 0  . clindamycin (CLEOCIN) 150 MG capsule Take 1 capsule (150 mg total) by mouth 3 (three) times daily. (Patient not taking: Reported on 05/15/2015) 21 capsule 0  . cloZAPine (CLOZARIL) 100 MG tablet Take 1 tablet (100 mg total) by mouth 2 (two) times daily. (Patient not taking: Reported on 05/15/2015) 60 tablet 0    Musculoskeletal: Strength & Muscle Tone: within normal limits Gait & Station: normal Patient leans: N/A  Psychiatric Specialty Exam: Physical Exam  Review of Systems  Unable to perform ROS: mental acuity    Blood pressure 121/66, pulse 90, temperature 98.6 F (37 C), temperature source Oral, resp. rate 18, SpO2 98 %.There is no weight on file to calculate BMI.  General Appearance: Casual and Disheveled  Eye Contact::  Good  Speech:  Pressured  Volume:  Normal  Mood:  Anxious and Depressed  Affect:  Congruent  Thought Process:  Disorganized, Loose and  Tangential  Orientation:  Full (Time, Place, and Person)  Thought Content:  Paranoid Ideation  Suicidal Thoughts:  No  Homicidal Thoughts:  No  Memory:  Immediate;   Poor Recent;   Poor Remote;   Poor  Judgement:  Impaired  Insight:  Shallow  Psychomotor Activity:  Normal  Concentration:  NA  Recall:  NA  Fund of Knowledge:Good  Language: Good  Akathisia:  NA  Handed:  Right  AIMS (if indicated):     Assets:  Desire for Improvement  ADL's:  Impaired  Cognition: Impaired,  Severe  Sleep:      Medical Decision Making: Review of Psycho-Social Stressors (1), Established  Problem, Worsening (2), Review of Medication Regimen & Side Effects (2) and Review of New Medication or Change in Dosage (2)  Treatment Plan Summary: Daily contact with patient to assess and evaluate symptoms and progress in treatment and Medication management  Plan:  Resume home medications,  We will use Ativan 1 mg po every 8 hours as needed for anxiety.  Disposition:   Admit  And seek placement.  Delfin Gant    PMHNP-BC 05/16/2015 3:09 PM Patient seen face-to-face for psychiatric evaluation, chart reviewed and case discussed with the physician extender and developed treatment plan. Reviewed the information documented and agree with the treatment plan. Corena Pilgrim, MD

## 2015-05-16 NOTE — Progress Notes (Addendum)
CSW called pt guardian, Enid Derry) who states that patietn may be mild MR, because patient has been told that by some doctors and others not. CSW asked for psychological. At this time, pt guardian is looking for psychological. Pt guardian directed CSW to call [phone] ext [phone] for Katie who would have access to patient file.Pt receives actt team services however pt guardian, does not have the contact info at this time. CSW awaiting return call from guardian, and associate [place].CSW awaiting call back from [facility] to check database for history of idd.   CSW spoke with Marlowe Kays at [facility], pt is not in I/DD database and patient has been evaluated and treated at [facility] and does NOT have I/DD diagnosis.   Belia Heman, [place] Work  [employer] [phone]

## 2015-05-16 NOTE — ED Notes (Signed)
Pt has 2 bags belongings placed in locker 26 containing clothing and shoes. Inventory sheet in pt's chart.

## 2015-05-16 NOTE — ED Notes (Signed)
Patient unable to void at this time

## 2015-05-16 NOTE — Progress Notes (Signed)
Declined at: [facility] - per [facility], due to agitation.  Verlon Setting, [place] Disposition staff 05/16/2015 4:03 PM

## 2015-05-16 NOTE — ED Notes (Signed)
Bed: WA26 Expected date:  Expected time:  Means of arrival:  Comments: RM16 

## 2015-05-16 NOTE — BHH Counselor (Signed)
The following gero psych facilities were contacted in an attempt to place the pt:  Referral faxed for review: [place]   TTS will continue to seek placement for the pt.  Faylene Kurtz, MS, CRC, [facility] Licensed Professional Counselor Therapeutic Triage Specialist [facility] Phone: [phone] Fax: [phone]

## 2015-05-16 NOTE — ED Notes (Signed)
Patient tried to void.  Unable to.

## 2015-05-16 NOTE — ED Notes (Signed)
Pt transported to CT ?

## 2015-05-16 NOTE — BH Assessment (Signed)
[place] Assessment Progress Note  The following facilities have been contacted to seek placement for this pt, with results as noted:  Beds available, information sent, decision pending:  [facility]  No beds available, but accepted referrals for future consideration; information sent:  [place]:  Leta Speller [place], [place] Triage Specialist [phone]

## 2015-05-16 NOTE — Progress Notes (Addendum)
Pt states his dr is in [facility] [place] Dr Hollice Espy  EPIC updated During Cm visit with pt he was noted to tell various jokes to CM and to answer questions with questions. Pleasant and jovial

## 2015-05-17 DIAGNOSIS — F209 Schizophrenia, unspecified: Secondary | ICD-10-CM | POA: Diagnosis not present

## 2015-05-17 NOTE — Progress Notes (Signed)
CSW spoke with Davy Pique of [facility] and informed her that the pt is up for discharge and ready to be picked up.  Davy Pique states that the pt needs a higher level of care. CSW informed Davy Pique that the pt cannot stay in [place], and that he has been psychiatrically and medically cleared. CSW expressed to Davy Pique that it is not appropriate for the patient to sit in the ED for shelter reasons.  Davy Pique states that she will call her supervisor and call CSW back. CSW will continue to follow up.  Willette Brace [id number] ED CSW 05/17/2015 1:46 PM

## 2015-05-17 NOTE — BHH Suicide Risk Assessment (Signed)
Suicide Risk Assessment  Discharge Assessment   [facility] Discharge Suicide Risk Assessment   Demographic Factors:  Male and Caucasian  Total Time spent with patient: 30 minutes  Musculoskeletal: Strength & Muscle Tone: within normal limits Gait & Station: normal Patient leans: N/A  Psychiatric Specialty Exam: Physical Exam  Review of Systems  Constitutional: Negative.   HENT: Negative.   Eyes: Negative.   Respiratory: Negative.   Cardiovascular: Negative.   Gastrointestinal: Negative.   Genitourinary: Negative.   Musculoskeletal: Negative.   Skin: Negative.   Neurological: Negative.   Endo/Heme/Allergies: Negative.   Psychiatric/Behavioral:       Jovial, delusional     Blood pressure 104/47, pulse 77, temperature 98.3 F (36.8 C), temperature source Oral, resp. rate 18, SpO2 99 %.There is no weight on file to calculate BMI.  General Appearance: Casual  Eye Contact::  Good  Speech:  Normal Rate  Volume:  Normal  Mood:  Euthymic  Affect:  Congruent  Thought Process:  Coherent  Orientation:  Full (Time, Place, and Person)  Thought Content:  Delusions a few but difficult to differentiate his joking/jovial comments  Suicidal Thoughts:  No  Homicidal Thoughts:  No  Memory:  Immediate;   Good Recent;   Good Remote;   Good  Judgement:  Fair  Insight:  Fair  Psychomotor Activity:  Normal  Concentration:  Good  Recall:  Good  Fund of Knowledge:Good  Language: Good  Akathisia:  No  Handed:  Right  AIMS (if indicated):     Assets:  Housing Leisure Time Physical Health Resilience Social Support  ADL's:  Intact  Cognition: WNL  Sleep:         Has this patient used any form of tobacco in the last 30 days? (Cigarettes, Smokeless Tobacco, Cigars, and/or Pipes) No  Mental Status Per Nursing Assessment::   On Admission:   Paranoia, hallucinations (increase)  Current Mental Status by Physician: NA  Loss Factors: NA  Historical Factors: NA  Risk Reduction  Factors:   Living with another person, especially a relative, Positive social support and Positive therapeutic relationship  Continued Clinical Symptoms:  Delusions, decreased  Cognitive Features That Contribute To Risk:  None    Suicide Risk:  Minimal: No identifiable suicidal ideation.  Patients presenting with no risk factors but with morbid ruminations; may be classified as minimal risk based on the severity of the depressive symptoms  Principal Problem: Schizophrenia Discharge Diagnoses:  Patient Active Problem List   Diagnosis Date Noted  . Schizophrenia [F20.9] 12/24/2012    Priority: High  . Cellulitis of right leg [L03.115] 04/06/2015  . Cellulitis [L03.90] 04/06/2015  . Cellulitis of right lower extremity [L03.115] 03/28/2015  . Sepsis [A41.9] 03/27/2015  . Prolonged QT interval [I45.81]   . Exertional dyspnea [R06.09] 05/29/2014  . CLL (chronic lymphocytic leukemia) [C91.10] 09/12/2013  . Non Hodgkin's lymphoma [C85.90] 09/12/2013  . CAP (community acquired pneumonia) [J18.9] 12/24/2012  . Leukocytosis [D72.829] 12/24/2012  . Anemia [D64.9] 12/24/2012  . CKD (chronic kidney disease), stage III [N18.3] 12/24/2012      Plan Of Care/Follow-up recommendations:  Activity:  as tolerated Diet:  heart healthy diet  Is patient on multiple antipsychotic therapies at discharge:  No   Has Patient had three or more failed trials of antipsychotic monotherapy by history:  No  Recommended Plan for Multiple Antipsychotic Therapies: NA    Krista Godsil, PMH-NP 05/17/2015, 11:27 AM

## 2015-05-17 NOTE — Consult Note (Signed)
[facility] Consult   Reason for Consult:  Paranoid Referring Physician:  EDP Patient Identification: [name] MRN:  [id number] Principal Diagnosis: Schizophrenia Diagnosis:   Patient Active Problem List   Diagnosis Date Noted  . Schizophrenia [F20.9] 12/24/2012    Priority: High  . Cellulitis of right leg [L03.115] 04/06/2015  . Cellulitis [L03.90] 04/06/2015  . Cellulitis of right lower extremity [L03.115] 03/28/2015  . Sepsis [A41.9] 03/27/2015  . Prolonged QT interval [I45.81]   . Exertional dyspnea [R06.09] 05/29/2014  . CLL (chronic lymphocytic leukemia) [C91.10] 09/12/2013  . Non Hodgkin's lymphoma [C85.90] 09/12/2013  . CAP (community acquired pneumonia) [J18.9] 12/24/2012  . Leukocytosis [D72.829] 12/24/2012  . Anemia [D64.9] 12/24/2012  . CKD (chronic kidney disease), stage III [N18.3] 12/24/2012    Total Time spent with patient: 30 minutes  Subjective:   [name] is a 64 y.o. male patient has stabilized.  HPI:  The patient has stabilized on his medications.  He is jovial and states he does not want to go back to his assisted living because he has been "assisted enough."  His paranoia is gone.  Affect is bright, sleep and appetite are good.  Denies suicidal/homicidal ideations, hallucinations, and alcohol/drug abuse. HPI Elements:   Location:  generalized. Quality:  acute. Severity:  moderate. Timing:  intermittent. Duration:  brief. Context:  stressors.  Past Medical History:  Past Medical History  Diagnosis Date  . Chronic kidney disease 09/20/2008  . Paranoid schizophrenia   . Hiatal hernia   . NHL (non-Hodgkin's lymphoma) dx'd 08/2008 rt groin    chemo comp 08/2008  . CLL (chronic lymphoblastic leukemia) 10/2003  . cll dx'd 10/2003    no rx  . Non Hodgkin's lymphoma 08/2008  . GERD (gastroesophageal reflux disease)    History reviewed. No pertinent past surgical history. Family History:  Family History  Problem  Relation Age of Onset  . Alcohol abuse Father    Social History:  History  Alcohol Use No     History  Drug Use No    History   Social History  . Marital Status: Single    Spouse Name: N/A  . Number of Children: N/A  . Years of Education: N/A   Social History Main Topics  . Smoking status: Never Smoker   . Smokeless tobacco: Not on file  . Alcohol Use: No  . Drug Use: No  . Sexual Activity: Not on file   Other Topics Concern  . None   Social History Narrative   Additional Social History:    Prescriptions: See PTA list History of alcohol / drug use?: No history of alcohol / drug abuse                     Allergies:  No Known Allergies  Labs:  Results for orders placed or performed during the hospital encounter of 05/15/15 (from the past 48 hour(s))  CBC WITH DIFFERENTIAL     Status: Abnormal   Collection Time: 05/15/15  6:29 PM  Result Value Ref Range   WBC 16.0 (H) 4.0 - 10.5 K/uL   RBC 4.84 4.22 - 5.81 MIL/uL   Hemoglobin 14.7 13.0 - 17.0 g/dL   HCT 44.3 39.0 - 52.0 %   MCV 91.5 78.0 - 100.0 fL   MCH 30.4 26.0 - 34.0 pg   MCHC 33.2 30.0 - 36.0 g/dL   RDW 13.4 11.5 - 15.5 %   Platelets 180 150 - 400 K/uL  Neutrophils Relative % 83 (H) 43 - 77 %   Neutro Abs 13.2 (H) 1.7 - 7.7 K/uL   Lymphocytes Relative 10 (L) 12 - 46 %   Lymphs Abs 1.6 0.7 - 4.0 K/uL   Monocytes Relative 6 3 - 12 %   Monocytes Absolute 0.9 0.1 - 1.0 K/uL   Eosinophils Relative 1 0 - 5 %   Eosinophils Absolute 0.2 0.0 - 0.7 K/uL   Basophils Relative 0 0 - 1 %   Basophils Absolute 0.0 0.0 - 0.1 K/uL  Comprehensive metabolic panel     Status: Abnormal   Collection Time: 05/15/15  6:29 PM  Result Value Ref Range   Sodium 140 135 - 145 mmol/L   Potassium 4.3 3.5 - 5.1 mmol/L   Chloride 104 101 - 111 mmol/L   CO2 24 22 - 32 mmol/L   Glucose, Bld 175 (H) 65 - 99 mg/dL   BUN 19 6 - 20 mg/dL   Creatinine, Ser 2.00 (H) 0.61 - 1.24 mg/dL   Calcium 10.1 8.9 - 10.3 mg/dL   Total  Protein 7.4 6.5 - 8.1 g/dL   Albumin 4.6 3.5 - 5.0 g/dL   AST 22 15 - 41 U/L   ALT 18 17 - 63 U/L   Alkaline Phosphatase 91 38 - 126 U/L   Total Bilirubin 0.7 0.3 - 1.2 mg/dL   GFR calc non Af Amer 34 (L) >60 mL/min   GFR calc Af Amer 39 (L) >60 mL/min    Comment: (NOTE) The eGFR has been calculated using the CKD EPI equation. This calculation has not been validated in all clinical situations. eGFR's persistently <60 mL/min signify possible Chronic Kidney Disease.    Anion gap 12 5 - 15  Ethanol     Status: None   Collection Time: 05/15/15  6:29 PM  Result Value Ref Range   Alcohol, Ethyl (B) <5 <5 mg/dL    Comment:        LOWEST DETECTABLE LIMIT FOR SERUM ALCOHOL IS 5 mg/dL FOR MEDICAL PURPOSES ONLY   Acetaminophen level     Status: Abnormal   Collection Time: 05/15/15  6:29 PM  Result Value Ref Range   Acetaminophen (Tylenol), Serum <10 (L) 10 - 30 ug/mL    Comment:        THERAPEUTIC CONCENTRATIONS VARY SIGNIFICANTLY. A RANGE OF 10-30 ug/mL MAY BE AN EFFECTIVE CONCENTRATION FOR MANY PATIENTS. HOWEVER, SOME ARE BEST TREATED AT CONCENTRATIONS OUTSIDE THIS RANGE. ACETAMINOPHEN CONCENTRATIONS >150 ug/mL AT 4 HOURS AFTER INGESTION AND >50 ug/mL AT 12 HOURS AFTER INGESTION ARE OFTEN ASSOCIATED WITH TOXIC REACTIONS.   Salicylate level     Status: None   Collection Time: 05/15/15  6:29 PM  Result Value Ref Range   Salicylate Lvl <0.9 2.8 - 30.0 mg/dL  Troponin I     Status: None   Collection Time: 05/15/15  6:35 PM  Result Value Ref Range   Troponin I <0.03 <0.031 ng/mL    Comment:        NO INDICATION OF MYOCARDIAL INJURY.   Urine rapid drug screen (hosp performed)not at [facility]     Status: None   Collection Time: 05/16/15  5:49 PM  Result Value Ref Range   Opiates NONE DETECTED NONE DETECTED   Cocaine NONE DETECTED NONE DETECTED   Benzodiazepines NONE DETECTED NONE DETECTED   Amphetamines NONE DETECTED NONE DETECTED   Tetrahydrocannabinol NONE DETECTED NONE  DETECTED   Barbiturates NONE DETECTED NONE DETECTED    Comment:  DRUG SCREEN FOR MEDICAL PURPOSES ONLY.  IF CONFIRMATION IS NEEDED FOR ANY PURPOSE, NOTIFY LAB WITHIN 5 DAYS.        LOWEST DETECTABLE LIMITS FOR URINE DRUG SCREEN Drug Class       Cutoff (ng/mL) Amphetamine      1000 Barbiturate      200 Benzodiazepine   366 Tricyclics       294 Opiates          300 Cocaine          300 THC              50     Vitals: Blood pressure 104/47, pulse 77, temperature 98.3 F (36.8 C), temperature source Oral, resp. rate 18, SpO2 99 %.  Risk to Self: Suicidal Ideation: No (denies) Suicidal Intent: No (denies) Is patient at risk for suicide?:  (denies) Suicidal Plan?: No (denies) Access to Means: No What has been your use of drugs/alcohol within the last 12 months?: none How many times?: 0 Other Self Harm Risks: none noted Triggers for Past Attempts:  (NONE NOTED) Intentional Self Injurious Behavior: None Risk to Others: Homicidal Ideation: No (denies) Thoughts of Harm to Others: Yes-Currently Present (pt sts he wants to "defend himself" from others after him) Comment - Thoughts of Harm to Others: pt is having delusion others are after him to harm him (sts devil, a Mafia boss and Gingus Humphrey Rolls want to hurt him) Current Homicidal Intent: No (denies) Current Homicidal Plan: No (denies) Access to Homicidal Means: No Identified Victim: Engineer, maintenance, resident at [employer] (Per Guardian, [facility] aware) History of harm to others?: No (none noted) Assessment of Violence: None Noted Violent Behavior Description: None noted per Guardian Does patient have access to weapons?: No Criminal Charges Pending?: No Does patient have a court date: No Prior Inpatient Therapy: Prior Inpatient Therapy: Yes Prior Therapy Dates: multiple Prior Therapy Facilty/Provider(s): multiple Reason for Treatment: paranoid schizaphrenia Prior Outpatient Therapy: Prior Outpatient Therapy: No Prior Therapy Dates:  na Prior Therapy Facilty/Provider(s): na Reason for Treatment: na Does patient have an ACCT team?: No Does patient have Intensive In-House Services?  : No Does patient have [facility] services? : No Does patient have P4CC services?: No  Current Facility-Administered Medications  Medication Dose Route Frequency Provider Last Rate Last Dose  . acetaminophen (TYLENOL) tablet 650 mg  650 mg Oral Q4H PRN Evelina Bucy, MD   650 mg at 05/16/15 1839  . alum & mag hydroxide-simeth (MAALOX/MYLANTA) 200-200-20 MG/5ML suspension 30 mL  30 mL Oral PRN Evelina Bucy, MD      . aspirin EC tablet 81 mg  81 mg Oral Daily Evelina Bucy, MD   81 mg at 05/17/15 0917  . benztropine (COGENTIN) tablet 0.5 mg  0.5 mg Oral Daily Evelina Bucy, MD   0.5 mg at 05/17/15 0917  . cephALEXin (KEFLEX) capsule 500 mg  500 mg Oral 3 times per day Evelina Bucy, MD   500 mg at 05/17/15 7654  . chlorproMAZINE (THORAZINE) tablet 50 mg  50 mg Oral BID PC Maddax Palinkas   50 mg at 05/17/15 0917  . docusate sodium (COLACE) capsule 100 mg  100 mg Oral Daily Evelina Bucy, MD   100 mg at 05/16/15 1009  . escitalopram (LEXAPRO) tablet 10 mg  10 mg Oral QAC breakfast Evelina Bucy, MD   10 mg at 05/17/15 6503  . ezetimibe (ZETIA) tablet 10 mg  10 mg Oral Daily Evelina Bucy, MD   10 mg at 05/17/15  0347  . ibuprofen (ADVIL,MOTRIN) tablet 600 mg  600 mg Oral Q8H PRN Evelina Bucy, MD      . lamoTRIgine (LAMICTAL) tablet 50 mg  50 mg Oral BID Evelina Bucy, MD   50 mg at 05/17/15 0917  . levothyroxine (SYNTHROID, LEVOTHROID) tablet 100 mcg  100 mcg Oral QAC breakfast Evelina Bucy, MD   100 mcg at 05/17/15 [phone]  . LORazepam (ATIVAN) tablet 1 mg  1 mg Oral Q8H PRN Evelina Bucy, MD   1 mg at 05/16/15 2152  . nicotine (NICODERM CQ - dosed in mg/24 hours) patch 21 mg  21 mg Transdermal Daily Evelina Bucy, MD   21 mg at 05/16/15 1011  . sulfamethoxazole-trimethoprim (BACTRIM DS,SEPTRA DS) 800-160 MG per tablet 1 tablet  1 tablet Oral Q12H Evelina Bucy, MD    1 tablet at 05/17/15 5638   Current Outpatient Prescriptions  Medication Sig Dispense Refill  . aspirin EC 81 MG tablet Take 81 mg by mouth daily.    . benztropine (COGENTIN) 0.5 MG tablet Take 0.5 mg by mouth daily.     . chlorproMAZINE (THORAZINE) 50 MG tablet Take 50 mg by mouth 2 (two) times daily.    . cholecalciferol (VITAMIN D) 1000 UNITS tablet Take 1,000 Units by mouth daily.    Marland Kitchen docusate sodium (COLACE) 100 MG capsule Take 100 mg by mouth daily.    Marland Kitchen escitalopram (LEXAPRO) 10 MG tablet Take 10 mg by mouth daily before breakfast.     . ezetimibe (ZETIA) 10 MG tablet Take 10 mg by mouth daily.    Marland Kitchen ketoconazole (NIZORAL) 2 % cream Apply 1 application topically 2 (two) times daily.     Marland Kitchen lamoTRIgine (LAMICTAL) 25 MG tablet Take 50 mg by mouth 2 (two) times daily.     Marland Kitchen levothyroxine (SYNTHROID, LEVOTHROID) 100 MCG tablet Take 100 mcg by mouth daily before breakfast.     . mineral oil liquid Place into the right ear once a week. Instill 1-2 drops into the right ear once weekly.    . Omega-3 Fatty Acids (FISH OIL) 1000 MG CAPS Take 4,000 mg by mouth daily.    . polyethylene glycol (MIRALAX / GLYCOLAX) packet Take 17 g by mouth daily.    Marland Kitchen senna (SENOKOT) 8.6 MG tablet Take 1 tablet by mouth daily.    . chlorproMAZINE (THORAZINE) 25 MG tablet Take 1 tablet (25 mg total) by mouth 2 (two) times daily. (Patient not taking: Reported on 05/15/2015) 60 tablet 0  . clindamycin (CLEOCIN) 150 MG capsule Take 1 capsule (150 mg total) by mouth 3 (three) times daily. (Patient not taking: Reported on 05/15/2015) 21 capsule 0  . cloZAPine (CLOZARIL) 100 MG tablet Take 1 tablet (100 mg total) by mouth 2 (two) times daily. (Patient not taking: Reported on 05/15/2015) 60 tablet 0    Musculoskeletal: Strength & Muscle Tone: within normal limits Gait & Station: normal Patient leans: N/A  Psychiatric Specialty Exam: Physical Exam  Review of Systems  Constitutional: Negative.   HENT: Negative.    Eyes: Negative.   Respiratory: Negative.   Cardiovascular: Negative.   Gastrointestinal: Negative.   Genitourinary: Negative.   Musculoskeletal: Negative.   Skin: Negative.   Neurological: Negative.   Endo/Heme/Allergies: Negative.   Psychiatric/Behavioral:       Jovial, delusional     Blood pressure 104/47, pulse 77, temperature 98.3 F (36.8 C), temperature source Oral, resp. rate 18, SpO2 99 %.There is no weight on file to calculate BMI.  General Appearance:  Casual  Eye Contact::  Good  Speech:  Normal Rate  Volume:  Normal  Mood:  Euthymic  Affect:  Congruent  Thought Process:  Coherent  Orientation:  Full (Time, Place, and Person)  Thought Content:  Delusions a few but difficult to differentiate his joking/jovial comments  Suicidal Thoughts:  No  Homicidal Thoughts:  No  Memory:  Immediate;   Good Recent;   Good Remote;   Good  Judgement:  Fair  Insight:  Fair  Psychomotor Activity:  Normal  Concentration:  Good  Recall:  Good  Fund of Knowledge:Good  Language: Good  Akathisia:  No  Handed:  Right  AIMS (if indicated):     Assets:  Housing Leisure Time Physical Health Resilience Social Support  ADL's:  Intact  Cognition: WNL  Sleep:      Medical Decision Making: Review of Psycho-Social Stressors (1), Review or order clinical lab tests (1) and Review of Medication Regimen & Side Effects (2)  Treatment Plan Summary: Daily contact with patient to assess and evaluate symptoms and progress in treatment, Medication management and Plan discharge to group home, follow-up with regular providers  Plan:  No evidence of imminent risk to self or others at present.   Disposition: discharge to group home, follow-up with regular providers  Waylan Boga, [place] 05/17/2015 11:17 AM Patient seen face-to-face for psychiatric evaluation, chart reviewed and case discussed with the physician extender and developed treatment plan. Reviewed the information documented and agree  with the treatment plan. Corena Pilgrim, MD

## 2015-05-17 NOTE — Progress Notes (Signed)
CSW spoke with Davy Pique of [facility]. She states that they are accepting the pt back to their facility. However, she says that they cannot provide transportation for the patient.  CSW made nurse aware. Nurse informed CSW that she will call [facility] for transportation.  Willette Brace [id number] ED CSW 05/17/2015 2:24 PM

## 2015-05-17 NOTE — Progress Notes (Signed)
Clinical Social Work  Patient has been cleared to DC back to group home. CSW spoke with guardian [name] who reports she is on vacation and asked CSW to call Sonya [phone]). Guardian reports patient was at [facility] on [address]. But they are concerned about patient returning because patient can sign himself out. Guardian requesting that patient stay until higher level of care is established.  CSW spoke with Surveyor, quantity Nathaniel Man) who reports patient unable to stay in hospital and guardians will need to arrange where patient can live. CSW shared this information with guardian who reports she will discuss plans with [facility] and call CSW back.  Guardian requests transportation from hospital back to group home. CSW agreeable to assist with transportation once arrangements have been made at group home.  Sindy Messing, LCSW (Coverage for [employer])

## 2015-05-17 NOTE — ED Notes (Signed)
MD at bedside. 

## 2015-07-23 ENCOUNTER — Emergency Department (HOSPITAL_COMMUNITY)
Admission: EM | Admit: 2015-07-23 | Discharge: 2015-07-24 | Disposition: A | Discharge status: Home / Self Care | Payer: Medicare Other | Attending: Emergency Medicine | Admitting: Emergency Medicine

## 2015-07-23 ENCOUNTER — Emergency Department (HOSPITAL_COMMUNITY): Payer: Medicare Other

## 2015-07-23 ENCOUNTER — Encounter (HOSPITAL_COMMUNITY): Payer: Self-pay | Admitting: Emergency Medicine

## 2015-07-23 DIAGNOSIS — E785 Hyperlipidemia, unspecified: Secondary | ICD-10-CM | POA: Insufficient documentation

## 2015-07-23 DIAGNOSIS — F259 Schizoaffective disorder, unspecified: Secondary | ICD-10-CM | POA: Diagnosis not present

## 2015-07-23 DIAGNOSIS — Z8572 Personal history of non-Hodgkin lymphomas: Secondary | ICD-10-CM | POA: Insufficient documentation

## 2015-07-23 DIAGNOSIS — Z8619 Personal history of other infectious and parasitic diseases: Secondary | ICD-10-CM | POA: Diagnosis not present

## 2015-07-23 DIAGNOSIS — Z8579 Personal history of other malignant neoplasms of lymphoid, hematopoietic and related tissues: Secondary | ICD-10-CM | POA: Diagnosis not present

## 2015-07-23 DIAGNOSIS — N189 Chronic kidney disease, unspecified: Secondary | ICD-10-CM | POA: Insufficient documentation

## 2015-07-23 DIAGNOSIS — Z7982 Long term (current) use of aspirin: Secondary | ICD-10-CM | POA: Diagnosis not present

## 2015-07-23 DIAGNOSIS — I129 Hypertensive chronic kidney disease with stage 1 through stage 4 chronic kidney disease, or unspecified chronic kidney disease: Secondary | ICD-10-CM | POA: Diagnosis not present

## 2015-07-23 DIAGNOSIS — R0789 Other chest pain: Secondary | ICD-10-CM | POA: Insufficient documentation

## 2015-07-23 DIAGNOSIS — R079 Chest pain, unspecified: Secondary | ICD-10-CM | POA: Diagnosis present

## 2015-07-23 DIAGNOSIS — Z872 Personal history of diseases of the skin and subcutaneous tissue: Secondary | ICD-10-CM | POA: Diagnosis not present

## 2015-07-23 DIAGNOSIS — Z79899 Other long term (current) drug therapy: Secondary | ICD-10-CM | POA: Diagnosis not present

## 2015-07-23 DIAGNOSIS — E559 Vitamin D deficiency, unspecified: Secondary | ICD-10-CM | POA: Diagnosis not present

## 2015-07-23 DIAGNOSIS — Z8719 Personal history of other diseases of the digestive system: Secondary | ICD-10-CM | POA: Insufficient documentation

## 2015-07-23 DIAGNOSIS — J189 Pneumonia, unspecified organism: Secondary | ICD-10-CM

## 2015-07-23 DIAGNOSIS — J159 Unspecified bacterial pneumonia: Secondary | ICD-10-CM | POA: Diagnosis not present

## 2015-07-23 HISTORY — DX: Cellulitis, unspecified: L03.90

## 2015-07-23 HISTORY — DX: Chronic lymphocytic leukemia of B-cell type not having achieved remission: C91.10

## 2015-07-23 HISTORY — DX: Schizoaffective disorder, unspecified: F25.9

## 2015-07-23 HISTORY — DX: Localized edema: R60.0

## 2015-07-23 HISTORY — DX: Abnormal electrocardiogram (ECG) (EKG): R94.31

## 2015-07-23 HISTORY — DX: Hyperlipidemia, unspecified: E78.5

## 2015-07-23 HISTORY — DX: Essential (primary) hypertension: I10

## 2015-07-23 HISTORY — DX: Vitamin D deficiency, unspecified: E55.9

## 2015-07-23 HISTORY — DX: Sepsis, unspecified organism: A41.9

## 2015-07-23 LAB — DIFFERENTIAL
Basophils Absolute: 0 10*3/uL (ref 0.0–0.1)
Basophils Relative: 0 % (ref 0–1)
EOS ABS: 0.2 10*3/uL (ref 0.0–0.7)
EOS PCT: 3 % (ref 0–5)
LYMPHS PCT: 24 % (ref 12–46)
Lymphs Abs: 2.2 10*3/uL (ref 0.7–4.0)
MONO ABS: 0.8 10*3/uL (ref 0.1–1.0)
Monocytes Relative: 9 % (ref 3–12)
Neutro Abs: 5.8 10*3/uL (ref 1.7–7.7)
Neutrophils Relative %: 64 % (ref 43–77)

## 2015-07-23 LAB — CBC
HEMATOCRIT: 39.3 % (ref 39.0–52.0)
Hemoglobin: 12.9 g/dL — ABNORMAL LOW (ref 13.0–17.0)
MCH: 29.7 pg (ref 26.0–34.0)
MCHC: 32.8 g/dL (ref 30.0–36.0)
MCV: 90.6 fL (ref 78.0–100.0)
PLATELETS: 162 10*3/uL (ref 150–400)
RBC: 4.34 MIL/uL (ref 4.22–5.81)
RDW: 14 % (ref 11.5–15.5)
WBC: 9 10*3/uL (ref 4.0–10.5)

## 2015-07-23 LAB — HEPATIC FUNCTION PANEL
ALK PHOS: 79 U/L (ref 38–126)
ALT: 18 U/L (ref 17–63)
AST: 20 U/L (ref 15–41)
Albumin: 3.4 g/dL — ABNORMAL LOW (ref 3.5–5.0)
BILIRUBIN INDIRECT: 0.1 mg/dL — AB (ref 0.3–0.9)
BILIRUBIN TOTAL: 0.2 mg/dL — AB (ref 0.3–1.2)
Bilirubin, Direct: 0.1 mg/dL (ref 0.1–0.5)
TOTAL PROTEIN: 5.6 g/dL — AB (ref 6.5–8.1)

## 2015-07-23 LAB — BASIC METABOLIC PANEL
Anion gap: 5 (ref 5–15)
BUN: 19 mg/dL (ref 6–20)
CHLORIDE: 103 mmol/L (ref 101–111)
CO2: 30 mmol/L (ref 22–32)
CREATININE: 1.94 mg/dL — AB (ref 0.61–1.24)
Calcium: 9.3 mg/dL (ref 8.9–10.3)
GFR calc Af Amer: 41 mL/min — ABNORMAL LOW (ref >60)
GFR calc non Af Amer: 35 mL/min — ABNORMAL LOW (ref >60)
GLUCOSE: 135 mg/dL — AB (ref 65–99)
POTASSIUM: 4.5 mmol/L (ref 3.5–5.1)
Sodium: 138 mmol/L (ref 135–145)

## 2015-07-23 LAB — D-DIMER, QUANTITATIVE: D-Dimer, Quant: 0.33 ug/mL-FEU (ref 0.00–0.48)

## 2015-07-23 LAB — I-STAT TROPONIN, ED: TROPONIN I, POC: 0.01 ng/mL (ref 0.00–0.08)

## 2015-07-23 LAB — TROPONIN I: Troponin I: <0.03 ng/mL (ref <0.031)

## 2015-07-23 MED ORDER — AZITHROMYCIN 250 MG PO TABS
500.0000 mg | ORAL_TABLET | Freq: Once | ORAL | Status: AC
  Administered 2015-07-23: 500 mg via ORAL
  Filled 2015-07-23: qty 2

## 2015-07-23 NOTE — ED Notes (Signed)
[facility] pt coming from [facility],  States chest pain started 5 day ago central chest pain no radiation, called EMS tonight because he just told the staff tonight. Feels like its a bruise.   Facility gave 3 81 mg prior to EMS arrival, one of which is a daily 81 mg os asa.   Pain inc upon deep inspiration and movement.

## 2015-07-23 NOTE — ED Notes (Signed)
Patient transported to X-ray 

## 2015-07-23 NOTE — ED Notes (Signed)
Pt states that he does not want an IV at this time

## 2015-07-23 NOTE — ED Provider Notes (Signed)
CSN: [id number]     Arrival date & time 07/23/15  2013 History   First MD Initiated Contact with Patient 07/23/15 2017     Chief Complaint  Patient presents with  . Chest Pain     (Consider location/radiation/quality/duration/timing/severity/associated sxs/prior Treatment) HPI Comments: Patient is a 64 year old male with history of pneumonia, chronic kidney disease, schizophrenia, chronic leukemia undergoing treatment presented to the emergency department via EMS from [facility]. Per patient and EMS he has had some trouble chest pain for 5 days. Denies any radiation of chest pain. He states his pain is sometimes worse movement and other times worse with inspiration. Denies any pain currently. No modifying factors identified. He received 381 mg aspirin prior to EMS arrival. Denies any fevers, cough, shortness of breath, lower extremity swelling, nausea, vomiting, diarrhea, syncope. Patient is a poor historian.   Patient is a 64 y.o. male presenting with chest pain.  Chest Pain   Past Medical History  Diagnosis Date  . Chronic kidney disease 09/20/2008  . Paranoid schizophrenia   . Hiatal hernia   . NHL (non-Hodgkin's lymphoma) dx'd 08/2008 rt groin    chemo comp 08/2008  . CLL (chronic lymphoblastic leukemia) 10/2003  . cll dx'd 10/2003    no rx  . Non Hodgkin's lymphoma 08/2008  . GERD (gastroesophageal reflux disease)   . Hypertension   . Schizoaffective disorder   . Vitamin D deficiency   . Chronic lymphocytic leukemia   . Non Hodgkin's lymphoma   . Hyperlipemia   . Lower extremity edema   . Sepsis   . Cellulitis   . Prolonged Q-T interval on ECG    History reviewed. No pertinent past surgical history. Family History  Problem Relation Age of Onset  . Alcohol abuse Father    Social History  Substance Use Topics  . Smoking status: Never Smoker   . Smokeless tobacco: None  . Alcohol Use: No    Review of Systems  Cardiovascular: Positive for chest  pain.  All other systems reviewed and are negative.     Allergies  Review of patient's allergies indicates no known allergies.  Home Medications   Prior to Admission medications   Medication Sig Start Date End Date Taking? Authorizing Provider  aspirin EC 81 MG tablet Take 81 mg by mouth daily.   Yes Historical Provider, MD  chlorproMAZINE (THORAZINE) 50 MG tablet Take 50 mg by mouth 2 (two) times daily.   Yes Historical Provider, MD  cholecalciferol (VITAMIN D) 1000 UNITS tablet Take 1,000 Units by mouth daily.   Yes Historical Provider, MD  cloZAPine (CLOZARIL) 100 MG tablet Take 100 mg by mouth 2 (two) times daily.   Yes Historical Provider, MD  docusate sodium (COLACE) 100 MG capsule Take 100 mg by mouth daily.   Yes Historical Provider, MD  escitalopram (LEXAPRO) 10 MG tablet Take 10 mg by mouth daily before breakfast.    Yes Historical Provider, MD  ezetimibe (ZETIA) 10 MG tablet Take 10 mg by mouth daily.   Yes Historical Provider, MD  ketoconazole (NIZORAL) 2 % cream Apply 1 application topically 2 (two) times daily.    Yes Historical Provider, MD  lamoTRIgine (LAMICTAL) 25 MG tablet Take 50 mg by mouth 2 (two) times daily.    Yes Historical Provider, MD  levothyroxine (SYNTHROID, LEVOTHROID) 100 MCG tablet Take 100 mcg by mouth daily before breakfast.    Yes Historical Provider, MD  mineral oil liquid Place into the right ear once a  week. Instill 1-2 drops into the right ear once weekly.   Yes Historical Provider, MD  Omega-3 Fatty Acids (FISH OIL) 1000 MG CAPS Take 4,000 mg by mouth daily.   Yes Historical Provider, MD  polyethylene glycol (MIRALAX / GLYCOLAX) packet Take 17 g by mouth daily.   Yes Historical Provider, MD  senna (SENOKOT) 8.6 MG tablet Take 1 tablet by mouth daily.   Yes Historical Provider, MD  chlorproMAZINE (THORAZINE) 25 MG tablet Take 1 tablet (25 mg total) by mouth 2 (two) times daily. Patient not taking: Reported on 05/15/2015 04/11/15   Robbie Lis, MD   clindamycin (CLEOCIN) 150 MG capsule Take 1 capsule (150 mg total) by mouth 3 (three) times daily. Patient not taking: Reported on 05/15/2015 04/11/15   Robbie Lis, MD  cloZAPine (CLOZARIL) 100 MG tablet Take 1 tablet (100 mg total) by mouth 2 (two) times daily. Patient not taking: Reported on 05/15/2015 04/11/15   Robbie Lis, MD  doxycycline (VIBRAMYCIN) 100 MG capsule Take 1 capsule (100 mg total) by mouth 2 (two) times daily. 07/24/15   Jemiah Cuadra, PA-C   BP 135/61 mmHg  Pulse 88  Temp(Src) 97.5 F (36.4 C) (Axillary)  Resp 18  Ht 6\' 1"  (1.854 m)  Wt 285 lb (129.275 kg)  BMI 37.61 kg/m2  SpO2 96% Physical Exam  Constitutional: He is oriented to person, place, and time. He appears well-developed and well-nourished.  HENT:  Head: Normocephalic and atraumatic.  Right Ear: External ear normal.  Left Ear: External ear normal.  Nose: Nose normal.  Mouth/Throat: No oropharyngeal exudate.  Eyes: Conjunctivae are normal.  Neck: Neck supple.  Cardiovascular: Normal rate, regular rhythm and normal heart sounds.   Pulmonary/Chest: Effort normal and breath sounds normal. He exhibits no tenderness.  Abdominal: Soft.  Musculoskeletal: He exhibits no edema.  Neurological: He is alert and oriented to person, place, and time.  Skin: Skin is warm and dry. No rash noted.  Nursing note and vitals reviewed.   ED Course  Procedures (including critical care time) Medications  azithromycin (ZITHROMAX) tablet 500 mg (500 mg Oral Given 07/23/15 2356)    Labs Review Labs Reviewed  BASIC METABOLIC PANEL - Abnormal; Notable for the following:    Glucose, Bld 135 (*)    Creatinine, Ser 1.94 (*)    GFR calc non Af Amer 35 (*)    GFR calc Af Amer 41 (*)    All other components within normal limits  CBC - Abnormal; Notable for the following:    Hemoglobin 12.9 (*)    All other components within normal limits  HEPATIC FUNCTION PANEL - Abnormal; Notable for the following:    Total  Protein 5.6 (*)    Albumin 3.4 (*)    Total Bilirubin 0.2 (*)    Indirect Bilirubin 0.1 (*)    All other components within normal limits  TROPONIN I  D-DIMER, QUANTITATIVE (NOT AT [facility])  DIFFERENTIAL  I-STAT TROPOININ, ED  Randolm Idol, ED    Imaging Review Dg Chest 2 View  07/23/2015   CLINICAL DATA:  Left-sided chest pain and shortness of Breath  EXAM: CHEST - 2 VIEW  COMPARISON:  05/15/2015  FINDINGS: Cardiac shadow is within normal limits. The lungs are well aerated bilaterally. Minimal increased density is noted in the left lung base which may represent some atelectasis or early infiltrate. No other focal infiltrate is seen. No bony abnormality is noted.  IMPRESSION: Minimal left basilar changes as described.  Electronically Signed   By: Inez Catalina M.D.   On: 07/23/2015 21:51   I have personally reviewed and evaluated these images and lab results as part of my medical decision-making.   EKG Interpretation None      MDM   Final diagnoses:  CAP (community acquired pneumonia)  Chest pain, atypical    Filed Vitals:   07/24/15 0244  BP: 135/61  Pulse: 88  Temp: 97.5 F (36.4 C)  Resp: 18   Afebrile, NAD, non-toxic appearing, AAOx4.   Patient is to be discharged with recommendation to follow up with PCP in regards to today's hospital visit. Chest pain is not likely of cardiac etiology d/t presentation, negative d-dimer, VSS, no tracheal deviation, no JVD or new murmur, RRR, breath sounds equal bilaterally, EKG without acute abnormalities, negative delta troponin. CXR shows possible early infiltrate. Patient is a poor historian and has frequent visits to the emergency department for pneumonia, will start on antibiotics. Will discharge patient home with advise follow-up with PCP. Return precautions discussed. Patient agreeable plan stable at time of discharge.   Case has been discussed with Dr. Dayna Barker who agrees with the above plan to discharge.      Baron Sane, PA-C 07/24/15 0400  Merrily Pew, MD 07/25/15 [phone]

## 2015-07-23 NOTE — ED Notes (Signed)
Phlebotomy at bedside.

## 2015-07-24 LAB — I-STAT TROPONIN, ED: Troponin i, poc: 0 ng/mL (ref 0.00–0.08)

## 2015-07-24 MED ORDER — DOXYCYCLINE HYCLATE 100 MG PO CAPS: 100.0000 mg | ORAL_CAPSULE | Freq: Two times a day (BID) | ORAL | Status: DC

## 2015-07-24 NOTE — ED Notes (Signed)
Pt waiting for [facility] for transportation. Coffee and sandwich given while waiting. Pt sitting in recliner

## 2015-07-24 NOTE — Discharge Instructions (Signed)
Please follow up with your primary care physician in 1-2 days. If you do not have one please call the [place] number listed above. Please take your antibiotic until completion. Please read all discharge instructions and return precautions.    Chest Pain (Nonspecific) It is often hard to give a specific diagnosis for the cause of chest pain. There is always a chance that your pain could be related to something serious, such as a heart attack or a blood clot in the lungs. You need to follow up with your health care provider for further evaluation. CAUSES   Heartburn.  Pneumonia or bronchitis.  Anxiety or stress.  Inflammation around your heart (pericarditis) or lung (pleuritis or pleurisy).  A blood clot in the lung.  A collapsed lung (pneumothorax). It can develop suddenly on its own (spontaneous pneumothorax) or from trauma to the chest.  Shingles infection (herpes zoster virus). The chest wall is composed of bones, muscles, and cartilage. Any of these can be the source of the pain.  The bones can be bruised by injury.  The muscles or cartilage can be strained by coughing or overwork.  The cartilage can be affected by inflammation and become sore (costochondritis). DIAGNOSIS  Lab tests or other studies may be needed to find the cause of your pain. Your health care provider may have you take a test called an ambulatory electrocardiogram (ECG). An ECG records your heartbeat patterns over a 24-hour period. You may also have other tests, such as:  Transthoracic echocardiogram (TTE). During echocardiography, sound waves are used to evaluate how blood flows through your heart.  Transesophageal echocardiogram (TEE).  Cardiac monitoring. This allows your health care provider to monitor your heart rate and rhythm in real time.  Holter monitor. This is a portable device that records your heartbeat and can help diagnose heart arrhythmias. It allows your health care  provider to track your heart activity for several days, if needed.  Stress tests by exercise or by giving medicine that makes the heart beat faster. TREATMENT   Treatment depends on what may be causing your chest pain. Treatment may include:  Acid blockers for heartburn.  Anti-inflammatory medicine.  Pain medicine for inflammatory conditions.  Antibiotics if an infection is present.  You may be advised to change lifestyle habits. This includes stopping smoking and avoiding alcohol, caffeine, and chocolate.  You may be advised to keep your head raised (elevated) when sleeping. This reduces the chance of acid going backward from your stomach into your esophagus. Most of the time, nonspecific chest pain will improve within 2-3 days with rest and mild pain medicine.  HOME CARE INSTRUCTIONS   If antibiotics were prescribed, take them as directed. Finish them even if you start to feel better.  For the next few days, avoid physical activities that bring on chest pain. Continue physical activities as directed.  Do not use any tobacco products, including cigarettes, chewing tobacco, or electronic cigarettes.  Avoid drinking alcohol.  Only take medicine as directed by your health care provider.  Follow your health care provider's suggestions for further testing if your chest pain does not go away.  Keep any follow-up appointments you made. If you do not go to an appointment, you could develop lasting (chronic) problems with pain. If there is any problem keeping an appointment, call to reschedule. SEEK MEDICAL CARE IF:   Your chest pain does not go away, even after treatment.  You have a rash with blisters on your  chest.  You have a fever. SEEK IMMEDIATE MEDICAL CARE IF:   You have increased chest pain or pain that spreads to your arm, neck, jaw, back, or abdomen.  You have shortness of breath.  You have an increasing cough, or you cough up blood.  You have severe back or  abdominal pain.  You feel nauseous or vomit.  You have severe weakness.  You faint.  You have chills. This is an emergency. Do not wait to see if the pain will go away. Get medical help at once. Call your local emergency services (911 in [place] Do not drive yourself to the hospital. MAKE SURE YOU:   Understand these instructions.  Will watch your condition.  Will get help right away if you are not doing well or get worse. Document Released: 08/12/2005 Document Revised: 11/07/2013 Document Reviewed: 06/07/2008 [facility] Patient Information 2015 [place], [place]. This information is not intended to replace advice given to you by your health care provider. Make sure you discuss any questions you have with your health care provider. Pneumonia Pneumonia is an infection of the lungs.  CAUSES Pneumonia may be caused by bacteria or a virus. Usually, these infections are caused by breathing infectious particles into the lungs (respiratory tract). SIGNS AND SYMPTOMS   Cough.  Fever.  Chest pain.  Increased rate of breathing.  Wheezing.  Mucus production. DIAGNOSIS  If you have the common symptoms of pneumonia, your health care provider will typically confirm the diagnosis with a chest X-ray. The X-ray will show an abnormality in the lung (pulmonary infiltrate) if you have pneumonia. Other tests of your blood, urine, or sputum may be done to find the specific cause of your pneumonia. Your health care provider may also do tests (blood gases or pulse oximetry) to see how well your lungs are working. TREATMENT  Some forms of pneumonia may be spread to other people when you cough or sneeze. You may be asked to wear a mask before and during your exam. Pneumonia that is caused by bacteria is treated with antibiotic medicine. Pneumonia that is caused by the influenza virus may be treated with an antiviral medicine. Most other viral infections must run their course. These infections will not  respond to antibiotics.  HOME CARE INSTRUCTIONS   Cough suppressants may be used if you are losing too much rest. However, coughing protects you by clearing your lungs. You should avoid using cough suppressants if you can.  Your health care provider may have prescribed medicine if he or she thinks your pneumonia is caused by bacteria or influenza. Finish your medicine even if you start to feel better.  Your health care provider may also prescribe an expectorant. This loosens the mucus to be coughed up.  Take medicines only as directed by your health care provider.  Do not smoke. Smoking is a common cause of bronchitis and can contribute to pneumonia. If you are a smoker and continue to smoke, your cough may last several weeks after your pneumonia has cleared.  A cold steam vaporizer or humidifier in your room or home may help loosen mucus.  Coughing is often worse at night. Sleeping in a semi-upright position in a recliner or using a couple pillows under your head will help with this.  Get rest as you feel it is needed. Your body will usually let you know when you need to rest. PREVENTION A pneumococcal shot (vaccine) is available to prevent a common bacterial cause of pneumonia. This is usually suggested  for:  People over 39 years old.  Patients on chemotherapy.  People with chronic lung problems, such as bronchitis or emphysema.  People with immune system problems. If you are over 65 or have a high risk condition, you may receive the pneumococcal vaccine if you have not received it before. In some countries, a routine influenza vaccine is also recommended. This vaccine can help prevent some cases of pneumonia.You may be offered the influenza vaccine as part of your care. If you smoke, it is time to quit. You may receive instructions on how to stop smoking. Your health care provider can provide medicines and counseling to help you quit. SEEK MEDICAL CARE IF: You have a fever. SEEK  IMMEDIATE MEDICAL CARE IF:   Your illness becomes worse. This is especially true if you are elderly or weakened from any other disease.  You cannot control your cough with suppressants and are losing sleep.  You begin coughing up blood.  You develop pain which is getting worse or is uncontrolled with medicines.  Any of the symptoms which initially brought you in for treatment are getting worse rather than better.  You develop shortness of breath or chest pain. MAKE SURE YOU:   Understand these instructions.  Will watch your condition.  Will get help right away if you are not doing well or get worse. Document Released: 11/02/2005 Document Revised: 03/19/2014 Document Reviewed: 01/22/2011 [facility] Patient Information 2015 [place], [place]. This information is not intended to replace advice given to you by your health care provider. Make sure you discuss any questions you have with your health care provider.

## 2016-02-12 ENCOUNTER — Encounter (HOSPITAL_COMMUNITY): Payer: Self-pay

## 2016-02-12 ENCOUNTER — Inpatient Hospital Stay (HOSPITAL_COMMUNITY)
Admission: EM | Admit: 2016-02-12 | Discharge: 2016-02-17 | DRG: 871 | Disposition: A | Discharge status: Home Health | Payer: Medicare Other | Attending: Internal Medicine | Admitting: Internal Medicine

## 2016-02-12 ENCOUNTER — Emergency Department (HOSPITAL_COMMUNITY): Payer: Medicare Other

## 2016-02-12 DIAGNOSIS — L03115 Cellulitis of right lower limb: Secondary | ICD-10-CM | POA: Diagnosis present

## 2016-02-12 DIAGNOSIS — R609 Edema, unspecified: Secondary | ICD-10-CM

## 2016-02-12 DIAGNOSIS — Z79899 Other long term (current) drug therapy: Secondary | ICD-10-CM | POA: Diagnosis not present

## 2016-02-12 DIAGNOSIS — J189 Pneumonia, unspecified organism: Secondary | ICD-10-CM | POA: Diagnosis not present

## 2016-02-12 DIAGNOSIS — K219 Gastro-esophageal reflux disease without esophagitis: Secondary | ICD-10-CM | POA: Diagnosis present

## 2016-02-12 DIAGNOSIS — J180 Bronchopneumonia, unspecified organism: Secondary | ICD-10-CM | POA: Diagnosis present

## 2016-02-12 DIAGNOSIS — Z7982 Long term (current) use of aspirin: Secondary | ICD-10-CM | POA: Diagnosis not present

## 2016-02-12 DIAGNOSIS — R509 Fever, unspecified: Secondary | ICD-10-CM

## 2016-02-12 DIAGNOSIS — E872 Acidosis: Secondary | ICD-10-CM | POA: Diagnosis present

## 2016-02-12 DIAGNOSIS — G9341 Metabolic encephalopathy: Secondary | ICD-10-CM | POA: Diagnosis present

## 2016-02-12 DIAGNOSIS — F200 Paranoid schizophrenia: Secondary | ICD-10-CM | POA: Diagnosis present

## 2016-02-12 DIAGNOSIS — I129 Hypertensive chronic kidney disease with stage 1 through stage 4 chronic kidney disease, or unspecified chronic kidney disease: Secondary | ICD-10-CM | POA: Diagnosis present

## 2016-02-12 DIAGNOSIS — E785 Hyperlipidemia, unspecified: Secondary | ICD-10-CM | POA: Diagnosis present

## 2016-02-12 DIAGNOSIS — N183 Chronic kidney disease, stage 3 unspecified: Secondary | ICD-10-CM | POA: Diagnosis present

## 2016-02-12 DIAGNOSIS — C8590 Non-Hodgkin lymphoma, unspecified, unspecified site: Secondary | ICD-10-CM | POA: Diagnosis present

## 2016-02-12 DIAGNOSIS — E559 Vitamin D deficiency, unspecified: Secondary | ICD-10-CM | POA: Diagnosis present

## 2016-02-12 DIAGNOSIS — Z856 Personal history of leukemia: Secondary | ICD-10-CM | POA: Diagnosis present

## 2016-02-12 DIAGNOSIS — N390 Urinary tract infection, site not specified: Secondary | ICD-10-CM | POA: Diagnosis present

## 2016-02-12 DIAGNOSIS — F209 Schizophrenia, unspecified: Secondary | ICD-10-CM | POA: Diagnosis present

## 2016-02-12 DIAGNOSIS — J690 Pneumonitis due to inhalation of food and vomit: Secondary | ICD-10-CM | POA: Diagnosis present

## 2016-02-12 DIAGNOSIS — J181 Lobar pneumonia, unspecified organism: Secondary | ICD-10-CM | POA: Diagnosis present

## 2016-02-12 DIAGNOSIS — C9110 Chronic lymphocytic leukemia of B-cell type not having achieved remission: Secondary | ICD-10-CM | POA: Diagnosis present

## 2016-02-12 DIAGNOSIS — Z8572 Personal history of non-Hodgkin lymphomas: Secondary | ICD-10-CM | POA: Diagnosis present

## 2016-02-12 DIAGNOSIS — A419 Sepsis, unspecified organism: Principal | ICD-10-CM | POA: Diagnosis present

## 2016-02-12 LAB — CBC
HCT: 42.6 % (ref 39.0–52.0)
HEMOGLOBIN: 14.4 g/dL (ref 13.0–17.0)
MCH: 30.8 pg (ref 26.0–34.0)
MCHC: 33.8 g/dL (ref 30.0–36.0)
MCV: 91.2 fL (ref 78.0–100.0)
PLATELETS: 111 10*3/uL — AB (ref 150–400)
RBC: 4.67 MIL/uL (ref 4.22–5.81)
RDW: 13.8 % (ref 11.5–15.5)
WBC: 16.1 10*3/uL — AB (ref 4.0–10.5)

## 2016-02-12 LAB — BLOOD GAS, VENOUS
Acid-base deficit: 1.5 mmol/L (ref 0.0–2.0)
BICARBONATE: 23.7 meq/L (ref 20.0–24.0)
Drawn by: 420201
O2 CONTENT: 4 L/min
O2 Saturation: 54.2 %
PATIENT TEMPERATURE: 100.8
PCO2 VEN: 46.5 mmHg (ref 45.0–50.0)
PH VEN: 7.335 — AB (ref 7.250–7.300)
TCO2: 21.3 mmol/L (ref 0–100)

## 2016-02-12 LAB — I-STAT CHEM 8, ED
BUN: 30 mg/dL — ABNORMAL HIGH (ref 6–20)
CALCIUM ION: 1.12 mmol/L — AB (ref 1.13–1.30)
CHLORIDE: 106 mmol/L (ref 101–111)
Creatinine, Ser: 1.9 mg/dL — ABNORMAL HIGH (ref 0.61–1.24)
Glucose, Bld: 157 mg/dL — ABNORMAL HIGH (ref 65–99)
HCT: 46 % (ref 39.0–52.0)
Hemoglobin: 15.6 g/dL (ref 13.0–17.0)
Potassium: 4 mmol/L (ref 3.5–5.1)
SODIUM: 142 mmol/L (ref 135–145)
TCO2: 22 mmol/L (ref 0–100)

## 2016-02-12 LAB — COMPREHENSIVE METABOLIC PANEL
ALT: 19 U/L (ref 17–63)
ANION GAP: 12 (ref 5–15)
AST: 23 U/L (ref 15–41)
Albumin: 4.2 g/dL (ref 3.5–5.0)
Alkaline Phosphatase: 83 U/L (ref 38–126)
BUN: 30 mg/dL — ABNORMAL HIGH (ref 6–20)
CHLORIDE: 108 mmol/L (ref 101–111)
CO2: 22 mmol/L (ref 22–32)
CREATININE: 2.14 mg/dL — AB (ref 0.61–1.24)
Calcium: 9.6 mg/dL (ref 8.9–10.3)
GFR, EST AFRICAN AMERICAN: 36 mL/min — AB (ref >60)
GFR, EST NON AFRICAN AMERICAN: 31 mL/min — AB (ref >60)
Glucose, Bld: 161 mg/dL — ABNORMAL HIGH (ref 65–99)
POTASSIUM: 4.2 mmol/L (ref 3.5–5.1)
SODIUM: 142 mmol/L (ref 135–145)
Total Bilirubin: 0.4 mg/dL (ref 0.3–1.2)
Total Protein: 6.8 g/dL (ref 6.5–8.1)

## 2016-02-12 LAB — I-STAT TROPONIN, ED: Troponin i, poc: 0.01 ng/mL (ref 0.00–0.08)

## 2016-02-12 LAB — LIPASE, BLOOD: LIPASE: 38 U/L (ref 11–51)

## 2016-02-12 LAB — I-STAT CG4 LACTIC ACID, ED: Lactic Acid, Venous: 2.98 mmol/L (ref 0.5–2.0)

## 2016-02-12 MED ORDER — ACETAMINOPHEN 650 MG RE SUPP
650.0000 mg | Freq: Once | RECTAL | Status: AC
  Administered 2016-02-12: 650 mg via RECTAL
  Filled 2016-02-12: qty 1

## 2016-02-12 MED ORDER — PIPERACILLIN-TAZOBACTAM 3.375 G IVPB 30 MIN
3.3750 g | Freq: Once | INTRAVENOUS | Status: AC
  Administered 2016-02-12: 3.375 g via INTRAVENOUS
  Filled 2016-02-12: qty 50

## 2016-02-12 MED ORDER — VANCOMYCIN HCL 10 G IV SOLR
2000.0000 mg | Freq: Once | INTRAVENOUS | Status: AC
  Administered 2016-02-12: 2000 mg via INTRAVENOUS
  Filled 2016-02-12: qty 2000

## 2016-02-12 MED ORDER — PIPERACILLIN-TAZOBACTAM 3.375 G IVPB
3.3750 g | Freq: Three times a day (TID) | INTRAVENOUS | Status: DC
  Administered 2016-02-13 – 2016-02-15 (×8): 3.375 g via INTRAVENOUS
  Filled 2016-02-12 (×7): qty 50

## 2016-02-12 MED ORDER — VANCOMYCIN HCL 10 G IV SOLR
1500.0000 mg | INTRAVENOUS | Status: DC
  Administered 2016-02-13: 1500 mg via INTRAVENOUS
  Filled 2016-02-12: qty 1500

## 2016-02-12 MED ORDER — SODIUM CHLORIDE 0.9 % IV BOLUS (SEPSIS)
1000.0000 mL | INTRAVENOUS | Status: DC
  Administered 2016-02-12 (×2): 1000 mL via INTRAVENOUS

## 2016-02-12 MED ORDER — VANCOMYCIN HCL IN DEXTROSE 1-5 GM/200ML-% IV SOLN: 1000.0000 mg | Freq: Once | INTRAVENOUS | Status: DC

## 2016-02-12 NOTE — ED Notes (Signed)
2 x 1000 ml nacl 0.9 going at 999 on pump given verbalized by provider

## 2016-02-12 NOTE — ED Notes (Signed)
Per EMS- Patient c/o chills and vomited x 1 prior to coming to the ED.

## 2016-02-12 NOTE — Progress Notes (Signed)
Pharmacy Antibiotic Note  [name] is a 65 y.o. male admitted on 02/12/2016 with sepsis.  Pharmacy has been consulted for vancomycin and Zosyn dosing.  Plan: Vancomycin 2g IV x 1, then 1500mg  IV every 24 hours.  Goal trough 15-20 mcg/mL. Zosyn 3.375g IV q8h (4 hour infusion).  Check trough at steady state Follow up renal function & cultures De-escalate as appropriate  Height: 6\' 1"  (185.4 cm) Weight: 275 lb (124.739 kg) IBW/kg (Calculated) : 79.9  Temp (24hrs), Avg:100.2 F (37.9 C), Min:99.7 F (37.6 C), Max:100.7 F (38.2 C)   Recent Labs Lab 02/12/16 1941 02/12/16 1942  CREATININE 1.90*  --   LATICACIDVEN  --  2.98*    Estimated Creatinine Clearance: 55.8 mL/min (by C-G formula based on Cr of 1.9).   CrCl 40 ml/min/1.50m2 (normalized)  No Known Allergies  Antimicrobials this admission: 3/29 >> Vanc >> 3/29 >> Zosyn >>  Dose adjustments this admission: ---  Microbiology results: 3/29 BCx: sent 3/29 UCx:   Thank you for allowing pharmacy to be a part of this patient's care.  Peggyann Juba, PharmD, BCPS Pager: [phone] 02/12/2016 7:58 PM

## 2016-02-12 NOTE — ED Provider Notes (Signed)
CSN: [id number]     Arrival date & time 02/12/16  1830 History   First MD Initiated Contact with Patient 02/12/16 1910     Chief Complaint  Patient presents with  . Chills  . Emesis     (Consider location/radiation/quality/duration/timing/severity/associated sxs/prior Treatment) HPI Comments: 65 year old male with extensive past medical history including lymphoma on chemotherapy, CK D, paranoid schizophrenia, hypertension who presents with vomiting. Pt was sent from his living facility with reports of chills and vomiting this afternoon. The patient states that his "bowels are congested" but later states to me that his congestion is in his chest. He is unable to elaborate any further. He was noted to be hypoxic on arrival.  LEVEL 5 CAVEAT DUE TO ALTERED MENTAL STATUS  Patient is a 65 y.o. male presenting with vomiting. The history is provided by the EMS personnel.  Emesis   Past Medical History  Diagnosis Date  . Chronic kidney disease 09/20/2008  . Paranoid schizophrenia ([place])   . Hiatal hernia   . NHL (non-Hodgkin's lymphoma) ([place]) dx'd 08/2008 rt groin    chemo comp 08/2008  . CLL (chronic lymphoblastic leukemia) 10/2003  . cll dx'd 10/2003    no rx  . Non Hodgkin's lymphoma ([place]) 08/2008  . GERD (gastroesophageal reflux disease)   . Hypertension   . Schizoaffective disorder ([place])   . Vitamin D deficiency   . Chronic lymphocytic leukemia ([place])   . Non Hodgkin's lymphoma ([place])   . Hyperlipemia   . Lower extremity edema   . Sepsis ([place])   . Cellulitis   . Prolonged Q-T interval on ECG    History reviewed. No pertinent past surgical history. Family History  Problem Relation Age of Onset  . Alcohol abuse Father    Social History  Substance Use Topics  . Smoking status: Never Smoker   . Smokeless tobacco: None  . Alcohol Use: No    Review of Systems  Gastrointestinal: Positive for vomiting.      Allergies  Review of patient's allergies indicates no known  allergies.  Home Medications   Prior to Admission medications   Medication Sig Start Date End Date Taking? Authorizing Provider  aspirin EC 81 MG tablet Take 81 mg by mouth daily.    Historical Provider, MD  chlorproMAZINE (THORAZINE) 25 MG tablet Take 1 tablet (25 mg total) by mouth 2 (two) times daily. Patient not taking: Reported on 05/15/2015 04/11/15   Robbie Lis, MD  chlorproMAZINE (THORAZINE) 50 MG tablet Take 50 mg by mouth 2 (two) times daily.    Historical Provider, MD  cholecalciferol (VITAMIN D) 1000 UNITS tablet Take 1,000 Units by mouth daily.    Historical Provider, MD  clindamycin (CLEOCIN) 150 MG capsule Take 1 capsule (150 mg total) by mouth 3 (three) times daily. Patient not taking: Reported on 05/15/2015 04/11/15   Robbie Lis, MD  cloZAPine (CLOZARIL) 100 MG tablet Take 1 tablet (100 mg total) by mouth 2 (two) times daily. Patient not taking: Reported on 05/15/2015 04/11/15   Robbie Lis, MD  cloZAPine (CLOZARIL) 100 MG tablet Take 100 mg by mouth 2 (two) times daily.    Historical Provider, MD  docusate sodium (COLACE) 100 MG capsule Take 100 mg by mouth daily.    Historical Provider, MD  doxycycline (VIBRAMYCIN) 100 MG capsule Take 1 capsule (100 mg total) by mouth 2 (two) times daily. 07/24/15   Jennifer Piepenbrink, PA-C  escitalopram (LEXAPRO) 10 MG tablet Take 10 mg by mouth daily  before breakfast.     Historical Provider, MD  ezetimibe (ZETIA) 10 MG tablet Take 10 mg by mouth daily.    Historical Provider, MD  ketoconazole (NIZORAL) 2 % cream Apply 1 application topically 2 (two) times daily.     Historical Provider, MD  lamoTRIgine (LAMICTAL) 25 MG tablet Take 50 mg by mouth 2 (two) times daily.     Historical Provider, MD  levothyroxine (SYNTHROID, LEVOTHROID) 100 MCG tablet Take 100 mcg by mouth daily before breakfast.     Historical Provider, MD  mineral oil liquid Place into the right ear once a week. Instill 1-2 drops into the right ear once weekly.     Historical Provider, MD  Omega-3 Fatty Acids (FISH OIL) 1000 MG CAPS Take 4,000 mg by mouth daily.    Historical Provider, MD  polyethylene glycol (MIRALAX / GLYCOLAX) packet Take 17 g by mouth daily.    Historical Provider, MD  senna (SENOKOT) 8.6 MG tablet Take 1 tablet by mouth daily.    Historical Provider, MD   BP 120/68 mmHg  Pulse 121  Temp(Src) 100.7 F (38.2 C) (Oral)  Resp 18  Ht 6\' 1"  (1.854 m)  Wt 275 lb (124.739 kg)  BMI 36.29 kg/m2  SpO2 87% Physical Exam  Constitutional: He appears well-developed and well-nourished. He appears distressed.  Pale, sleepy, mildly tachypneic  HENT:  Head: Normocephalic and atraumatic.  Eyes: Conjunctivae are normal. Pupils are equal, round, and reactive to light.  Neck: Neck supple.  Cardiovascular: Regular rhythm and normal heart sounds.  Tachycardia present.   No murmur heard. Pulmonary/Chest: No respiratory distress. He has no wheezes.  Increased WOB, faint crackles in bases  Abdominal: Soft. Bowel sounds are normal. He exhibits distension. There is no tenderness.  Musculoskeletal: He exhibits edema.  Trace b/l LE edema  Neurological:  Awake but sleepy, slowed response time, unable to provide history  Skin: Skin is warm. He is diaphoretic. There is pallor.  Nursing note and vitals reviewed.   ED Course  .Critical Care Performed by: Sharlett Iles Authorized by: Sharlett Iles Total critical care time: 60 minutes Critical care time was exclusive of separately billable procedures and treating other patients. Critical care was necessary to treat or prevent imminent or life-threatening deterioration of the following conditions: sepsis. Critical care was time spent personally by me on the following activities: development of treatment plan with patient or surrogate, evaluation of patient's response to treatment, examination of patient, obtaining history from patient or surrogate, ordering and performing treatments and  interventions, ordering and review of laboratory studies, ordering and review of radiographic studies, pulse oximetry, re-evaluation of patient's condition and review of old charts.   (including critical care time) Labs Review Labs Reviewed  CULTURE, BLOOD (ROUTINE X 2)  CULTURE, BLOOD (ROUTINE X 2)  URINE CULTURE  LIPASE, BLOOD  COMPREHENSIVE METABOLIC PANEL  CBC  URINALYSIS, ROUTINE W REFLEX MICROSCOPIC (NOT AT [facility])  BLOOD GAS, VENOUS  I-STAT CG4 LACTIC ACID, ED  I-STAT CHEM 8, ED  I-STAT TROPOININ, ED    Imaging Review No results found. I have personally reviewed and evaluated these images and lab results as part of my medical decision-making.   EKG Interpretation   Date/Time:  Wednesday February 12 2016 19:13:22 EDT Ventricular Rate:  120 PR Interval:  137 QRS Duration: 156 QT Interval:  378 QTC Calculation: 534 R Axis:   -31 Text Interpretation:  Sinus tachycardia Right bundle branch block  tachycardia new from previous Confirmed by Kinya Meine  MD, Apolonio Schneiders [phone]) on  02/12/2016 7:29:24 PM     Medications  sodium chloride 0.9 % bolus 1,000 mL (not administered)  piperacillin-tazobactam (ZOSYN) IVPB 3.375 g (not administered)  vancomycin (VANCOCIN) IVPB 1000 mg/200 mL premix (not administered)    MDM   Final diagnoses:  HCAP (healthcare-associated pneumonia)  Sepsis, due to unspecified organism ([place])   Pt w/ h/o lymphoma Sent by his living facility for chills and one episode of vomiting. On arrival, the patient was ill-appearing, pale, diaphoretic, and dyspneic. He was hypoxic and placed on 4 L nasal cannula. Heart rate in the 120s, blood pressure stable. He had abdominal distention without tenderness. He kept referring to "congestion which he initially said was in his abdomen but later said was in his chest. He was unable to provide any further history. He was noted to have fever of 100.7. Initiated a code sepsis with blood and urine cultures, vancomycin and Zosyn, and IV  fluid bolus. EKG shows sinus tachycardia without ischemic changes.  Initial lab work notable for lactate of 2.98, creatinine 2.14, WBC 16,000. KUB without obstructive changes. Chest x-ray shows basilar pneumonia. I discussed admission with [facility] hospitalist, Dr. [name], who admitted pt to stepdown for further care.  Sharlett Iles, MD 02/12/16 [phone]

## 2016-02-12 NOTE — H&P (Signed)
PCP:   Janeth Rase, MD   Chief Complaint:  fever  HPI: 65 yo male h/o schizophrenia, lymphoma and CLL on chemo treatment, CKD, htn comes in with fever and chills.  Pt is confused right now and delirious.  He really cannot answer questions appropriately and his temp was high on arrival.  Pt denies any pain and appears comfortable.  With hypotension in ED, febrile.  Pt being given vanc/zosyn and 4 liters ivf bolus per sepsis protocol.  His bp seems to be responding to the ivf bolus.  Pt referred for admission for sepsis, likely due to pna.    Of note, pt has rle cellulitis which he says has been red since 2009.  It looks like a cellulitis, hot to touch.  Suspect his history is inaccurate.  Review of Systems:  Unobtainable from pt due to ams  Past Medical History: Past Medical History  Diagnosis Date  . Chronic kidney disease 09/20/2008  . Paranoid schizophrenia ([place])   . Hiatal hernia   . NHL (non-Hodgkin's lymphoma) ([place]) dx'd 08/2008 rt groin    chemo comp 08/2008  . CLL (chronic lymphoblastic leukemia) 10/2003  . cll dx'd 10/2003    no rx  . Non Hodgkin's lymphoma ([place]) 08/2008  . GERD (gastroesophageal reflux disease)   . Hypertension   . Schizoaffective disorder ([place])   . Vitamin D deficiency   . Chronic lymphocytic leukemia ([place])   . Non Hodgkin's lymphoma ([place])   . Hyperlipemia   . Lower extremity edema   . Sepsis ([place])   . Cellulitis   . Prolonged Q-T interval on ECG    History reviewed. No pertinent past surgical history.  Medications: Prior to Admission medications   Medication Sig Start Date End Date Taking? Authorizing Provider  aspirin EC 81 MG tablet Take 81 mg by mouth daily.   Yes Historical Provider, MD  chlorproMAZINE (THORAZINE) 100 MG tablet Take 100 mg by mouth every evening.   Yes Historical Provider, MD  chlorproMAZINE (THORAZINE) 50 MG tablet Take 50 mg by mouth 2 (two) times daily.   Yes Historical Provider, MD  cholecalciferol (VITAMIN D)  1000 UNITS tablet Take 5,000 Units by mouth daily.    Yes Historical Provider, MD  cloZAPine (CLOZARIL) 100 MG tablet Take 200 mg by mouth at bedtime.    Yes Historical Provider, MD  clozapine (CLOZARIL) 50 MG tablet Take 50 mg by mouth 2 (two) times daily.   Yes Historical Provider, MD  docusate sodium (COLACE) 100 MG capsule Take 100 mg by mouth daily.   Yes Historical Provider, MD  escitalopram (LEXAPRO) 10 MG tablet Take 10 mg by mouth daily before breakfast.    Yes Historical Provider, MD  ezetimibe (ZETIA) 10 MG tablet Take 10 mg by mouth daily.   Yes Historical Provider, MD  ketoconazole (NIZORAL) 2 % cream Apply 1 application topically 2 (two) times daily.    Yes Historical Provider, MD  lamoTRIgine (LAMICTAL) 25 MG tablet Take 50 mg by mouth 2 (two) times daily.    Yes Historical Provider, MD  levothyroxine (SYNTHROID, LEVOTHROID) 100 MCG tablet Take 100 mcg by mouth daily before breakfast.    Yes Historical Provider, MD  mineral oil liquid Place into the right ear once a week. Instill 1-2 drops into the right ear once weekly.   Yes Historical Provider, MD  Omega-3 Fatty Acids (FISH OIL) 1000 MG CAPS Take 4,000 mg by mouth daily.   Yes Historical Provider, MD  polyethylene glycol (MIRALAX /  GLYCOLAX) packet Take 17 g by mouth daily.   Yes Historical Provider, MD  senna (SENOKOT) 8.6 MG tablet Take 1 tablet by mouth daily.   Yes Historical Provider, MD    Allergies:  No Known Allergies  Social History:  reports that he has never smoked. He does not have any smokeless tobacco history on file. He reports that he does not drink alcohol or use illicit drugs.  Family History: Family History  Problem Relation Age of Onset  . Alcohol abuse Father     Physical Exam: Filed Vitals:   02/12/16 2238 02/12/16 2307 02/12/16 2312 02/12/16 2316  BP: 106/53 98/46 98/46  105/44  Pulse: 103 104 105 106  Temp:  98.8 F (37.1 C)  98.8 F (37.1 C)  TempSrc:  Oral  Oral  Resp: 24 24 25 25    Height:      Weight:      SpO2: 95% 99% 95% 99%   General appearance: alert, cooperative, delirious and no distress Head: Normocephalic, without obvious abnormality, atraumatic Eyes: negative Nose: Nares normal. Septum midline. Mucosa normal. No drainage or sinus tenderness. Neck: no JVD and supple, symmetrical, trachea midline Lungs: clear to auscultation bilaterally Heart: regular rate and rhythm, S1, S2 normal, no murmur, click, rub or gallop Abdomen: soft, non-tender; bowel sounds normal; no masses,  no organomegaly Extremities: extremities normal, atraumatic, no cyanosis or edema Pulses: 2+ and symmetric Skin: Skin color, texture, turgor normal. No rashes or lesions x rle cellulitis from ankle up to almost knee Neurologic: Grossly normal    Labs on Admission:   Recent Labs  02/12/16 1929 02/12/16 1941  NA 142 142  K 4.2 4.0  CL 108 106  CO2 22  --   GLUCOSE 161* 157*  BUN 30* 30*  CREATININE 2.14* 1.90*  CALCIUM 9.6  --     Recent Labs  02/12/16 1929  AST 23  ALT 19  ALKPHOS 83  BILITOT 0.4  PROT 6.8  ALBUMIN 4.2    Recent Labs  02/12/16 1929  LIPASE 38    Recent Labs  02/12/16 1929 02/12/16 1941  WBC 16.1*  --   HGB 14.4 15.6  HCT 42.6 46.0  MCV 91.2  --   PLT 111*  --     Radiological Exams on Admission: Dg Chest 1 View  02/12/2016  CLINICAL DATA:  Chills and vomiting.  Chronic leukemia EXAM: CHEST 1 VIEW COMPARISON:  07/23/2015 FINDINGS: Heart size is normal. Mediastinal shadows are normal. There is abnormal density at both lung bases left more than right. This could be due to bronchopneumonia or aspiration. The may be a small amount of pleural fluid on the left. No evidence heart failure. IMPRESSION: Abnormal density at the lung bases left more than right. This could be bronchopneumonia or aspiration. Small amount of pleural fluid on the left. Electronically Signed   By: Nelson Chimes M.D.   On: 02/12/2016 21:16   Dg Abd 1 View  02/12/2016   CLINICAL DATA:  Acute onset of chills and vomiting. Initial encounter. EXAM: ABDOMEN - 1 VIEW COMPARISON:  Abdominal radiograph performed 03/27/2015 FINDINGS: The visualized bowel gas pattern is unremarkable. Scattered air and stool filled loops of colon are seen; no abnormal dilatation of small bowel loops is seen to suggest small bowel obstruction. No free intra-abdominal air is identified, though evaluation for free air is limited on supine views. The visualized osseous structures are within normal limits; the sacroiliac joints are unremarkable in appearance. The visualized lung bases  are essentially clear. IMPRESSION: Unremarkable bowel gas pattern; no free intra-abdominal air seen. Moderate amount of stool noted in the colon. Electronically Signed   By: Garald Balding M.D.   On: 02/12/2016 21:21   Old chart reviewed cxr reviewed possible infiltrate bibasilar  Assessment/Plan  65 yo male with acute encephalopathy from sepsis source lung vs skin vs both  Principal Problem:   Sepsis ([place])-  Seems to be responding to ivf bolus, at about 2.5 liters so far.  Repeat lactic acid pending, initial one less than 3.  Place on vanco/zosyn.  Flu is pending.  ua is also pending.  Place in stepdown overnight, proceed to pressors if needed after appropriate fluid bolus.    Active Problems:   CKD (chronic kidney disease), stage III- at baseline   Schizophrenia ([place])- noted, cont oral antipsychotics   CLL (chronic lymphocytic leukemia) ([place])- noted   Non Hodgkin's lymphoma ([place])- noted   Cellulitis of right lower extremity- as above   PNA (pneumonia)- as above  acute metabolic encephalopathy - no focal neuro deficits.  Due to infection, hypotension, mild acidosis.  Treat infection.  Neuro checks q 2 hours.  Admit to stepdown.  Full code.   [name],RACHAL A 02/12/2016, 11:36 PM

## 2016-02-12 NOTE — ED Notes (Addendum)
PAtient is a resident of [facility]. Patient also states, "My bowels are congested." and will elaborate further.

## 2016-02-12 NOTE — ED Notes (Signed)
In CT

## 2016-02-13 DIAGNOSIS — N183 Chronic kidney disease, stage 3 (moderate): Secondary | ICD-10-CM

## 2016-02-13 DIAGNOSIS — A419 Sepsis, unspecified organism: Principal | ICD-10-CM

## 2016-02-13 DIAGNOSIS — L03115 Cellulitis of right lower limb: Secondary | ICD-10-CM

## 2016-02-13 DIAGNOSIS — J189 Pneumonia, unspecified organism: Secondary | ICD-10-CM

## 2016-02-13 DIAGNOSIS — F200 Paranoid schizophrenia: Secondary | ICD-10-CM

## 2016-02-13 DIAGNOSIS — C9110 Chronic lymphocytic leukemia of B-cell type not having achieved remission: Secondary | ICD-10-CM

## 2016-02-13 LAB — CORTISOL: Cortisol, Plasma: 24.5 ug/dL

## 2016-02-13 LAB — BASIC METABOLIC PANEL
Anion gap: 5 (ref 5–15)
BUN: 28 mg/dL — AB (ref 6–20)
CALCIUM: 8.4 mg/dL — AB (ref 8.9–10.3)
CO2: 23 mmol/L (ref 22–32)
CREATININE: 1.94 mg/dL — AB (ref 0.61–1.24)
Chloride: 113 mmol/L — ABNORMAL HIGH (ref 101–111)
GFR calc non Af Amer: 35 mL/min — ABNORMAL LOW (ref >60)
GFR, EST AFRICAN AMERICAN: 41 mL/min — AB (ref >60)
Glucose, Bld: 142 mg/dL — ABNORMAL HIGH (ref 65–99)
Potassium: 4.9 mmol/L (ref 3.5–5.1)
SODIUM: 141 mmol/L (ref 135–145)

## 2016-02-13 LAB — CBC WITH DIFFERENTIAL/PLATELET
BASOS ABS: 0 10*3/uL (ref 0.0–0.1)
Basophils Relative: 0 %
Eosinophils Absolute: 0 10*3/uL (ref 0.0–0.7)
Eosinophils Relative: 0 %
HEMATOCRIT: 38.3 % — AB (ref 39.0–52.0)
HEMOGLOBIN: 12.7 g/dL — AB (ref 13.0–17.0)
LYMPHS ABS: 0.5 10*3/uL — AB (ref 0.7–4.0)
Lymphocytes Relative: 2 %
MCH: 31.1 pg (ref 26.0–34.0)
MCHC: 33.2 g/dL (ref 30.0–36.0)
MCV: 93.6 fL (ref 78.0–100.0)
Monocytes Absolute: 0.8 10*3/uL (ref 0.1–1.0)
Monocytes Relative: 3 %
NEUTROS ABS: 25.4 10*3/uL — AB (ref 1.7–7.7)
Neutrophils Relative %: 95 %
Platelets: 125 10*3/uL — ABNORMAL LOW (ref 150–400)
RBC: 4.09 MIL/uL — AB (ref 4.22–5.81)
RDW: 14.3 % (ref 11.5–15.5)
WBC: 26.7 10*3/uL — AB (ref 4.0–10.5)

## 2016-02-13 LAB — MRSA PCR SCREENING: MRSA BY PCR: NEGATIVE

## 2016-02-13 LAB — INFLUENZA PANEL BY PCR (TYPE A & B)
H1N1FLUPCR: NOT DETECTED
INFLAPCR: NEGATIVE
Influenza B By PCR: NEGATIVE

## 2016-02-13 LAB — PROTIME-INR
INR: 1.12 (ref 0.00–1.49)
Prothrombin Time: 14.6 seconds (ref 11.6–15.2)

## 2016-02-13 LAB — TROPONIN I: Troponin I: <0.03 ng/mL (ref <0.031)

## 2016-02-13 LAB — URINALYSIS, ROUTINE W REFLEX MICROSCOPIC
BILIRUBIN URINE: NEGATIVE
Glucose, UA: NEGATIVE mg/dL
Hgb urine dipstick: NEGATIVE
Ketones, ur: NEGATIVE mg/dL
Leukocytes, UA: NEGATIVE
NITRITE: NEGATIVE
PH: 5 (ref 5.0–8.0)
Protein, ur: NEGATIVE mg/dL
Specific Gravity, Urine: 1.021 (ref 1.005–1.030)

## 2016-02-13 LAB — PROCALCITONIN: Procalcitonin: 26.42 ng/mL

## 2016-02-13 LAB — LACTIC ACID, PLASMA
LACTIC ACID, VENOUS: 2.8 mmol/L — AB (ref 0.5–2.0)
LACTIC ACID, VENOUS: 2.2 mmol/L — AB (ref 0.5–2.0)

## 2016-02-13 LAB — TYPE AND SCREEN
ABO/RH(D): B POS
ANTIBODY SCREEN: NEGATIVE

## 2016-02-13 LAB — ABO/RH: ABO/RH(D): B POS

## 2016-02-13 LAB — APTT: aPTT: 30 seconds (ref 24–37)

## 2016-02-13 MED ORDER — HYDRALAZINE HCL 20 MG/ML IJ SOLN: 5.0000 mg | Freq: Once | INTRAMUSCULAR | Status: DC

## 2016-02-13 MED ORDER — IPRATROPIUM BROMIDE 0.02 % IN SOLN: 0.5000 mg | Freq: Four times a day (QID) | RESPIRATORY_TRACT | Status: DC

## 2016-02-13 MED ORDER — CHLORPROMAZINE HCL 25 MG PO TABS
50.0000 mg | ORAL_TABLET | Freq: Every day | ORAL | Status: DC
  Administered 2016-02-13 – 2016-02-17 (×5): 50 mg via ORAL
  Filled 2016-02-13 (×10): qty 1

## 2016-02-13 MED ORDER — ALBUTEROL SULFATE (2.5 MG/3ML) 0.083% IN NEBU: 2.5000 mg | INHALATION_SOLUTION | RESPIRATORY_TRACT | Status: DC | PRN

## 2016-02-13 MED ORDER — IPRATROPIUM-ALBUTEROL 0.5-2.5 (3) MG/3ML IN SOLN
3.0000 mL | Freq: Four times a day (QID) | RESPIRATORY_TRACT | Status: DC
  Administered 2016-02-13: 3 mL via RESPIRATORY_TRACT
  Filled 2016-02-13: qty 3

## 2016-02-13 MED ORDER — ONDANSETRON HCL 4 MG PO TABS: 4.0000 mg | ORAL_TABLET | Freq: Four times a day (QID) | ORAL | Status: DC | PRN

## 2016-02-13 MED ORDER — SODIUM CHLORIDE 0.9 % IV BOLUS (SEPSIS)
500.0000 mL | Freq: Once | INTRAVENOUS | Status: AC
  Administered 2016-02-13: 500 mL via INTRAVENOUS

## 2016-02-13 MED ORDER — ENOXAPARIN SODIUM 80 MG/0.8ML ~~LOC~~ SOLN
65.0000 mg | SUBCUTANEOUS | Status: DC
  Administered 2016-02-14 – 2016-02-17 (×4): 65 mg via SUBCUTANEOUS
  Filled 2016-02-13 (×7): qty 0.8

## 2016-02-13 MED ORDER — CHLORPROMAZINE HCL 50 MG PO TABS
150.0000 mg | ORAL_TABLET | ORAL | Status: DC
  Administered 2016-02-13 – 2016-02-16 (×4): 150 mg via ORAL
  Filled 2016-02-13: qty 1
  Filled 2016-02-13: qty 2
  Filled 2016-02-13 (×4): qty 1
  Filled 2016-02-13: qty 2

## 2016-02-13 MED ORDER — CLOZAPINE 25 MG PO TABS
50.0000 mg | ORAL_TABLET | Freq: Two times a day (BID) | ORAL | Status: DC
  Administered 2016-02-13 – 2016-02-17 (×9): 50 mg via ORAL
  Filled 2016-02-13 (×11): qty 2

## 2016-02-13 MED ORDER — ENOXAPARIN SODIUM 40 MG/0.4ML ~~LOC~~ SOLN
40.0000 mg | SUBCUTANEOUS | Status: DC
  Administered 2016-02-13: 40 mg via SUBCUTANEOUS
  Filled 2016-02-13: qty 0.4

## 2016-02-13 MED ORDER — GUAIFENESIN ER 600 MG PO TB12
600.0000 mg | ORAL_TABLET | Freq: Two times a day (BID) | ORAL | Status: DC
  Administered 2016-02-13 – 2016-02-14 (×4): 600 mg via ORAL
  Filled 2016-02-13 (×4): qty 1

## 2016-02-13 MED ORDER — SODIUM CHLORIDE 0.9 % IV SOLN
INTRAVENOUS | Status: AC
  Administered 2016-02-13 (×2): via INTRAVENOUS

## 2016-02-13 MED ORDER — CLOZAPINE 25 MG PO TABS: 50.0000 mg | ORAL_TABLET | Freq: Two times a day (BID) | ORAL | Status: DC

## 2016-02-13 MED ORDER — ALBUTEROL SULFATE (2.5 MG/3ML) 0.083% IN NEBU: 2.5000 mg | INHALATION_SOLUTION | Freq: Four times a day (QID) | RESPIRATORY_TRACT | Status: DC

## 2016-02-13 MED ORDER — ONDANSETRON HCL 4 MG/2ML IJ SOLN: 4.0000 mg | Freq: Four times a day (QID) | INTRAMUSCULAR | Status: DC | PRN

## 2016-02-13 MED ORDER — CETYLPYRIDINIUM CHLORIDE 0.05 % MT LIQD
7.0000 mL | Freq: Two times a day (BID) | OROMUCOSAL | Status: DC
  Administered 2016-02-13 – 2016-02-17 (×6): 7 mL via OROMUCOSAL

## 2016-02-13 NOTE — ED Notes (Signed)
20 min timer started 12:11 - up at 12: 31 am to arrive

## 2016-02-13 NOTE — Care Management Note (Signed)
Case Management Note  Patient Details  Name: GREISON FOLINO MRN: [id number] Date of Birth: Apr 28, 1951  Subjective/Objective:  65 y/o m admitted w/sepsis,PNA, cellulitis RLE.  [id number].Per Nsg Summary-Possibly from Group home-CSW will confirm.                 Action/Plan:d/c plan group home if confirmed.   Expected Discharge Date:   (unknown)               Expected Discharge Plan:  Group Home  In-House Referral:  Clinical Social Work  Discharge planning Services  CM Consult  Post Acute Care Choice:    Choice offered to:     DME Arranged:    DME Agency:     HH Arranged:    HH Agency:     Status of Service:  In process, will continue to follow  Medicare Important Message Given:    Date Medicare IM Given:    Medicare IM give by:    Date Additional Medicare IM Given:    Additional Medicare Important Message give by:     If discussed at [place] of Stay Meetings, dates discussed:    Additional Comments:  Dessa Phi, RN 02/13/2016, 1:43 PM

## 2016-02-13 NOTE — Progress Notes (Signed)
[facility] PROGRESS NOTE   [name] [id number] DOB: 13-Jun-1951 DOA: 02/12/2016 PCP: Janeth Rase, MD  HPI/Subjective: Eels much better, denies any complaints. Reported that his right leg is been like this since 2011, check Doppler of the right lower extremity.  Assessment/Plan: Principal Problem:   Sepsis ([place]) Active Problems:   CKD (chronic kidney disease), stage III   Schizophrenia ([facility])   CLL (chronic lymphocytic leukemia) ([facility])   Non Hodgkin's lymphoma ([facility])   Cellulitis of right lower extremity   PNA (pneumonia)   Sepsis Sepsis present on admission, fever 100.7 and heart rate of 117 and possible infection.  Repeat lactic acid pending, initial one less than 3. On vancomycin and Zosyn.  Placed in stepdown overnight, blood pressure is improved after IV fluid resuscitation. Continue the current antibiotics. Lactic acid was 2.9 on admission, procalcitonin is 26.4.  Pneumonia X-ray shows bibasilar pneumonia probable bronchopneumonia versus aspiration. Patient is on vancomycin and Zosyn. Continue current supportive management with bronchodilators, mucolytics, antitussives and oxygen as needed.  Cellulitis of right lower extremity Redness, swelling and  CKD Stage III At baseline of 1.9.  Schizophrenia Noted, cont oral antipsychotics  CLL Appear to be at baseline, WBC nodularity at baseline, patient likely secondary to concurrent infection.  Non Hodgkin's lymphoma ([place])- noted  Acute metabolic encephalopathy No focal neuro deficits. Due to infection, hypotension, mild acidosis. This is improved.   Code Status: Full Code Family Communication: Plan discussed with the patient. Disposition Plan: Remains inpatient Diet: Diet clear liquid Room service appropriate?: Yes; Fluid consistency:: Thin  Consultants:  None  Procedures:  None  Antibiotics:  Zosyn and Vancomycin   Objective: Filed Vitals:   02/13/16 0700 02/13/16 0800  BP:  109/51 110/41  Pulse: 95 92  Temp:  98.1 F (36.7 C)  Resp: 26 9    Intake/Output Summary (Last 24 hours) at 02/13/16 1135 Last data filed at 02/13/16 0645  Gross per 24 hour  Intake  862.5 ml  Output    675 ml  Net  187.5 ml   Filed Weights   02/12/16 1857 02/13/16 0100  Weight: 124.739 kg (275 lb) 131.3 kg (289 lb 7.4 oz)    Exam: General: Alert and awake, oriented x3, not in any acute distress. HEENT: anicteric sclera, pupils reactive to light and accommodation, EOMI CVS: S1-S2 clear, no murmur rubs or gallops Chest: clear to auscultation bilaterally, no wheezing, rales or rhonchi Abdomen: soft nontender, nondistended, normal bowel sounds, no organomegaly Extremities: no cyanosis, clubbing or edema noted bilaterally Neuro: Cranial nerves II-XII intact, no focal neurological deficits  Data Reviewed: Basic Metabolic Panel:  Recent Labs Lab 02/12/16 1929 02/12/16 1941 02/13/16 0551  NA 142 142 141  K 4.2 4.0 4.9  CL 108 106 113*  CO2 22  --  23  GLUCOSE 161* 157* 142*  BUN 30* 30* 28*  CREATININE 2.14* 1.90* 1.94*  CALCIUM 9.6  --  8.4*   Liver Function Tests:  Recent Labs Lab 02/12/16 1929  AST 23  ALT 19  ALKPHOS 83  BILITOT 0.4  PROT 6.8  ALBUMIN 4.2    Recent Labs Lab 02/12/16 1929  LIPASE 38   No results for input(s): AMMONIA in the last 168 hours. CBC:  Recent Labs Lab 02/12/16 1929 02/12/16 1941 02/13/16 0551  WBC 16.1*  --  26.7*  NEUTROABS  --   --  25.4*  HGB 14.4 15.6 12.7*  HCT 42.6 46.0 38.3*  MCV 91.2  --  93.6  PLT 111*  --  125*   Cardiac Enzymes:  Recent Labs Lab 02/13/16 0215  TROPONINI <0.03   BNP (last 3 results) No results for input(s): BNP in the last 8760 hours.  ProBNP (last 3 results) No results for input(s): PROBNP in the last 8760 hours.  CBG: No results for input(s): GLUCAP in the last 168 hours.  Micro Recent Results (from the past 240 hour(s))  MRSA PCR Screening     Status: None    Collection Time: 02/13/16  1:30 AM  Result Value Ref Range Status   MRSA by PCR NEGATIVE NEGATIVE Final    Comment:        The GeneXpert MRSA Assay (FDA approved for NASAL specimens only), is one component of a comprehensive MRSA colonization surveillance program. It is not intended to diagnose MRSA infection nor to guide or monitor treatment for MRSA infections.      Studies: Dg Chest 1 View  02/12/2016  CLINICAL DATA:  Chills and vomiting.  Chronic leukemia EXAM: CHEST 1 VIEW COMPARISON:  07/23/2015 FINDINGS: Heart size is normal. Mediastinal shadows are normal. There is abnormal density at both lung bases left more than right. This could be due to bronchopneumonia or aspiration. The may be a small amount of pleural fluid on the left. No evidence heart failure. IMPRESSION: Abnormal density at the lung bases left more than right. This could be bronchopneumonia or aspiration. Small amount of pleural fluid on the left. Electronically Signed   By: Nelson Chimes M.D.   On: 02/12/2016 21:16   Dg Abd 1 View  02/12/2016  CLINICAL DATA:  Acute onset of chills and vomiting. Initial encounter. EXAM: ABDOMEN - 1 VIEW COMPARISON:  Abdominal radiograph performed 03/27/2015 FINDINGS: The visualized bowel gas pattern is unremarkable. Scattered air and stool filled loops of colon are seen; no abnormal dilatation of small bowel loops is seen to suggest small bowel obstruction. No free intra-abdominal air is identified, though evaluation for free air is limited on supine views. The visualized osseous structures are within normal limits; the sacroiliac joints are unremarkable in appearance. The visualized lung bases are essentially clear. IMPRESSION: Unremarkable bowel gas pattern; no free intra-abdominal air seen. Moderate amount of stool noted in the colon. Electronically Signed   By: Garald Balding M.D.   On: 02/12/2016 21:21    Scheduled Meds: . antiseptic oral rinse  7 mL Mouth Rinse BID  .  chlorproMAZINE  150 mg Oral Q24H  . chlorproMAZINE  50 mg Oral Q breakfast  . clozapine  50 mg Oral BID  . enoxaparin (LOVENOX) injection  40 mg Subcutaneous Q24H  . guaiFENesin  600 mg Oral BID  . hydrALAZINE  5 mg Intravenous Once  . piperacillin-tazobactam (ZOSYN)  IV  3.375 g Intravenous Q8H  . vancomycin  1,500 mg Intravenous Q24H   Continuous Infusions: . sodium chloride 75 mL/hr at 02/13/16 0815       Time spent: 35 minutes    [facility] A  [facility] Pager [phone] If 7PM-7AM, please contact night-coverage at [email or web address], password [facility] 02/13/2016, 11:35 AM  LOS: 1 day

## 2016-02-14 LAB — BASIC METABOLIC PANEL
ANION GAP: 8 (ref 5–15)
BUN: 24 mg/dL — ABNORMAL HIGH (ref 6–20)
CHLORIDE: 112 mmol/L — AB (ref 101–111)
CO2: 23 mmol/L (ref 22–32)
Calcium: 8.6 mg/dL — ABNORMAL LOW (ref 8.9–10.3)
Creatinine, Ser: 1.94 mg/dL — ABNORMAL HIGH (ref 0.61–1.24)
GFR calc non Af Amer: 35 mL/min — ABNORMAL LOW (ref >60)
GFR, EST AFRICAN AMERICAN: 41 mL/min — AB (ref >60)
Glucose, Bld: 136 mg/dL — ABNORMAL HIGH (ref 65–99)
POTASSIUM: 4.1 mmol/L (ref 3.5–5.1)
SODIUM: 143 mmol/L (ref 135–145)

## 2016-02-14 LAB — CBC
HCT: 36.7 % — ABNORMAL LOW (ref 39.0–52.0)
HEMOGLOBIN: 12.2 g/dL — AB (ref 13.0–17.0)
MCH: 30.9 pg (ref 26.0–34.0)
MCHC: 33.2 g/dL (ref 30.0–36.0)
MCV: 92.9 fL (ref 78.0–100.0)
Platelets: 112 10*3/uL — ABNORMAL LOW (ref 150–400)
RBC: 3.95 MIL/uL — AB (ref 4.22–5.81)
RDW: 14.4 % (ref 11.5–15.5)
WBC: 14.3 10*3/uL — ABNORMAL HIGH (ref 4.0–10.5)

## 2016-02-14 LAB — URINE CULTURE: CULTURE: NO GROWTH

## 2016-02-14 MED ORDER — GUAIFENESIN ER 600 MG PO TB12
1200.0000 mg | ORAL_TABLET | Freq: Two times a day (BID) | ORAL | Status: DC
  Administered 2016-02-14 – 2016-02-17 (×6): 1200 mg via ORAL
  Filled 2016-02-14 (×6): qty 2

## 2016-02-14 NOTE — Progress Notes (Signed)
[facility] PROGRESS NOTE   [name] [id number] DOB: [birth year] DOA: 02/12/2016 PCP: Janeth Rase, MD  HPI/Subjective: Feels better denies fever, denies chills or any other complaints. Reportedly on 2 L of oxygen, he was not wearing the oxygen when I was talking to him. We'll transfer him out of the stepdown to regular bed.  Assessment/Plan: Principal Problem:   Sepsis ([place]) Active Problems:   CKD (chronic kidney disease), stage III   Schizophrenia ([facility])   CLL (chronic lymphocytic leukemia) ([facility])   Non Hodgkin's lymphoma ([facility])   Cellulitis of right lower extremity   PNA (pneumonia)   Sepsis Sepsis present on admission, fever 100.7 and heart rate of 117 and possible infection.  Initial lactic acid 2.98, repeat was 2.2. On vancomycin and Zosyn.  Initially admitted to stepdown, blood pressure is improved after IV fluid resuscitation. Continue the current antibiotics. Lactic acid was 2.9 on admission, procalcitonin is 26.4. MRSA by PCR is negative, I will de-escalate antibiotics today.  Pneumonia X-ray shows bibasilar pneumonia probable bronchopneumonia versus aspiration. Patient is on vancomycin and Zosyn. Continue current supportive management with bronchodilators, mucolytics, antitussives and oxygen as needed.  Cellulitis of right lower extremity Redness, swelling and tenderness. This appears to be chronic, on hospital admission from May 2015 has same level of redness. We'll repeat Doppler for DVT.  CKD Stage III At baseline of 1.9.  Schizophrenia Noted, cont oral antipsychotics  CLL WBC baseline within normal limits, leukocytosis likely secondary to the concurrent infection. This is improving.  Non Hodgkin's lymphoma ([place])- noted  Acute metabolic encephalopathy No focal neuro deficits. Due to infection, hypotension, mild acidosis. This is resolved   Code Status: Full Code Family Communication: Plan discussed with the  patient. Disposition Plan: Remains inpatient Diet: Diet Heart Room service appropriate?: Yes; Fluid consistency:: Thin  Consultants:  None  Procedures:  None  Antibiotics:  Zosyn and Vancomycin   Objective: Filed Vitals:   02/14/16 0600 02/14/16 0845  BP:  120/65  Pulse: 95 93  Temp:    Resp: 18 19    Intake/Output Summary (Last 24 hours) at 02/14/16 1207 Last data filed at 02/14/16 0931  Gross per 24 hour  Intake   1330 ml  Output   1000 ml  Net    330 ml   Filed Weights   02/12/16 1857 02/13/16 0100  Weight: 124.739 kg (275 lb) 131.3 kg (289 lb 7.4 oz)    Exam: General: Alert and awake, oriented x3, not in any acute distress. HEENT: anicteric sclera, pupils reactive to light and accommodation, EOMI CVS: S1-S2 clear, no murmur rubs or gallops Chest: clear to auscultation bilaterally, no wheezing, rales or rhonchi Abdomen: soft nontender, nondistended, normal bowel sounds, no organomegaly Extremities: no cyanosis, clubbing or edema noted bilaterally Neuro: Cranial nerves II-XII intact, no focal neurological deficits  Data Reviewed: Basic Metabolic Panel:  Recent Labs Lab 02/12/16 1929 02/12/16 1941 02/13/16 0551 02/14/16 0305  NA 142 142 141 143  K 4.2 4.0 4.9 4.1  CL 108 106 113* 112*  CO2 22  --  23 23  GLUCOSE 161* 157* 142* 136*  BUN 30* 30* 28* 24*  CREATININE 2.14* 1.90* 1.94* 1.94*  CALCIUM 9.6  --  8.4* 8.6*   Liver Function Tests:  Recent Labs Lab 02/12/16 1929  AST 23  ALT 19  ALKPHOS 83  BILITOT 0.4  PROT 6.8  ALBUMIN 4.2    Recent Labs Lab 02/12/16 1929  LIPASE 38   No results for input(s): AMMONIA  in the last 168 hours. CBC:  Recent Labs Lab 02/12/16 1929 02/12/16 1941 02/13/16 0551 02/14/16 0305  WBC 16.1*  --  26.7* 14.3*  NEUTROABS  --   --  25.4*  --   HGB 14.4 15.6 12.7* 12.2*  HCT 42.6 46.0 38.3* 36.7*  MCV 91.2  --  93.6 92.9  PLT 111*  --  125* 112*   Cardiac Enzymes:  Recent Labs Lab  02/13/16 0215  TROPONINI <0.03   BNP (last 3 results) No results for input(s): BNP in the last 8760 hours.  ProBNP (last 3 results) No results for input(s): PROBNP in the last 8760 hours.  CBG: No results for input(s): GLUCAP in the last 168 hours.  Micro Recent Results (from the past 240 hour(s))  Blood Culture (routine x 2)     Status: None (Preliminary result)   Collection Time: 02/12/16  7:35 PM  Result Value Ref Range Status   Specimen Description BLOOD RIGHT ARM  Final   Special Requests BOTTLES DRAWN AEROBIC AND ANAEROBIC 5CC  Final   Culture   Final    NO GROWTH 1 DAY Performed at [facility]    Report Status PENDING  Incomplete  Blood Culture (routine x 2)     Status: None (Preliminary result)   Collection Time: 02/12/16  7:36 PM  Result Value Ref Range Status   Specimen Description BLOOD LEFT ARM  Final   Special Requests BOTTLES DRAWN AEROBIC AND ANAEROBIC 5CC  Final   Culture   Final    NO GROWTH 1 DAY Performed at [facility]    Report Status PENDING  Incomplete  MRSA PCR Screening     Status: None   Collection Time: 02/13/16  1:30 AM  Result Value Ref Range Status   MRSA by PCR NEGATIVE NEGATIVE Final    Comment:        The GeneXpert MRSA Assay (FDA approved for NASAL specimens only), is one component of a comprehensive MRSA colonization surveillance program. It is not intended to diagnose MRSA infection nor to guide or monitor treatment for MRSA infections.   Urine culture     Status: None   Collection Time: 02/13/16  6:58 AM  Result Value Ref Range Status   Specimen Description URINE, CLEAN CATCH  Final   Special Requests NONE  Final   Culture   Final    NO GROWTH 1 DAY Performed at [facility]    Report Status 02/14/2016 FINAL  Final     Studies: Dg Chest 1 View  02/12/2016  CLINICAL DATA:  Chills and vomiting.  Chronic leukemia EXAM: CHEST 1 VIEW COMPARISON:  07/23/2015 FINDINGS: Heart size is normal.  Mediastinal shadows are normal. There is abnormal density at both lung bases left more than right. This could be due to bronchopneumonia or aspiration. The may be a small amount of pleural fluid on the left. No evidence heart failure. IMPRESSION: Abnormal density at the lung bases left more than right. This could be bronchopneumonia or aspiration. Small amount of pleural fluid on the left. Electronically Signed   By: Nelson Chimes M.D.   On: 02/12/2016 21:16   Dg Abd 1 View  02/12/2016  CLINICAL DATA:  Acute onset of chills and vomiting. Initial encounter. EXAM: ABDOMEN - 1 VIEW COMPARISON:  Abdominal radiograph performed 03/27/2015 FINDINGS: The visualized bowel gas pattern is unremarkable. Scattered air and stool filled loops of colon are seen; no abnormal dilatation of small bowel loops is seen  to suggest small bowel obstruction. No free intra-abdominal air is identified, though evaluation for free air is limited on supine views. The visualized osseous structures are within normal limits; the sacroiliac joints are unremarkable in appearance. The visualized lung bases are essentially clear. IMPRESSION: Unremarkable bowel gas pattern; no free intra-abdominal air seen. Moderate amount of stool noted in the colon. Electronically Signed   By: Garald Balding M.D.   On: 02/12/2016 21:21    Scheduled Meds: . antiseptic oral rinse  7 mL Mouth Rinse BID  . chlorproMAZINE  150 mg Oral Q24H  . chlorproMAZINE  50 mg Oral Q breakfast  . clozapine  50 mg Oral BID  . enoxaparin (LOVENOX) injection  65 mg Subcutaneous Q24H  . guaiFENesin  600 mg Oral BID  . hydrALAZINE  5 mg Intravenous Once  . piperacillin-tazobactam (ZOSYN)  IV  3.375 g Intravenous Q8H  . vancomycin  1,500 mg Intravenous Q24H   Continuous Infusions:       Time spent: 35 minutes    [facility] A  [facility] Pager [phone] If 7PM-7AM, please contact night-coverage at [email or web address], password [facility] 02/14/2016, 12:07 PM  LOS: 2  days

## 2016-02-15 ENCOUNTER — Encounter (HOSPITAL_COMMUNITY): Payer: Self-pay

## 2016-02-15 ENCOUNTER — Inpatient Hospital Stay (HOSPITAL_COMMUNITY): Payer: Medicare Other

## 2016-02-15 DIAGNOSIS — R609 Edema, unspecified: Secondary | ICD-10-CM

## 2016-02-15 LAB — BASIC METABOLIC PANEL
Anion gap: 9 (ref 5–15)
BUN: 19 mg/dL (ref 6–20)
CHLORIDE: 109 mmol/L (ref 101–111)
CO2: 22 mmol/L (ref 22–32)
Calcium: 9.1 mg/dL (ref 8.9–10.3)
Creatinine, Ser: 1.73 mg/dL — ABNORMAL HIGH (ref 0.61–1.24)
GFR calc Af Amer: 47 mL/min — ABNORMAL LOW (ref >60)
GFR calc non Af Amer: 41 mL/min — ABNORMAL LOW (ref >60)
Glucose, Bld: 157 mg/dL — ABNORMAL HIGH (ref 65–99)
POTASSIUM: 4.2 mmol/L (ref 3.5–5.1)
SODIUM: 140 mmol/L (ref 135–145)

## 2016-02-15 LAB — CBC
HEMATOCRIT: 36.2 % — AB (ref 39.0–52.0)
HEMOGLOBIN: 12.1 g/dL — AB (ref 13.0–17.0)
MCH: 30.7 pg (ref 26.0–34.0)
MCHC: 33.4 g/dL (ref 30.0–36.0)
MCV: 91.9 fL (ref 78.0–100.0)
Platelets: 125 10*3/uL — ABNORMAL LOW (ref 150–400)
RBC: 3.94 MIL/uL — ABNORMAL LOW (ref 4.22–5.81)
RDW: 14.4 % (ref 11.5–15.5)
WBC: 10.4 10*3/uL (ref 4.0–10.5)

## 2016-02-15 MED ORDER — FUROSEMIDE 10 MG/ML IJ SOLN
40.0000 mg | Freq: Once | INTRAMUSCULAR | Status: AC
  Administered 2016-02-15: 40 mg via INTRAVENOUS
  Filled 2016-02-15: qty 4

## 2016-02-15 MED ORDER — AMOXICILLIN-POT CLAVULANATE 875-125 MG PO TABS
1.0000 | ORAL_TABLET | Freq: Two times a day (BID) | ORAL | Status: DC
  Administered 2016-02-15 – 2016-02-17 (×4): 1 via ORAL
  Filled 2016-02-15 (×4): qty 1

## 2016-02-15 NOTE — Progress Notes (Signed)
*  Preliminary Results* Right lower extremity venous duplex completed. Study was technically difficult due to patient body habitus and edema. Visualized veins of the right lower extremity is negative for deep vein thrombosis. There is no evidence of right Baker's cyst.  02/15/2016 2:05 PM  Maudry Mayhew, RVT, RDCS, RDMS

## 2016-02-15 NOTE — Progress Notes (Signed)
[facility] PROGRESS NOTE   [name] [id number] DOB: 17-Feb-1951 DOA: 02/12/2016 PCP: Janeth Rase, MD  HPI/Subjective: Denies any fever or chills, feels much better. Right lower extremity feels better to him, still swollen.  Assessment/Plan: Principal Problem:   Sepsis ([place]) Active Problems:   CKD (chronic kidney disease), stage III   Schizophrenia (HCC)   CLL (chronic lymphocytic leukemia) (HCC)   Non Hodgkin's lymphoma (HCC)   Cellulitis of right lower extremity   PNA (pneumonia)   Sepsis Sepsis present on admission, fever 100.7 and heart rate of 117 and possible infection.  Initial lactic acid 2.98, repeat was 2.2. On vancomycin and Zosyn.  Initially admitted to stepdown, blood pressure is improved after IV fluid resuscitation. Continue the current antibiotics. Lactic acid was 2.9 on admission, procalcitonin is 26.4. MRSA by PCR is negative, I will de-escalate antibiotics today to oral antibiotics.  Pneumonia X-ray shows bibasilar pneumonia probable bronchopneumonia versus aspiration. Patient is on vancomycin and Zosyn. Continue current supportive management with bronchodilators, mucolytics, antitussives and oxygen as needed.  Cellulitis of right lower extremity Redness, swelling and tenderness. This appears to be chronic, on hospital admission from May 2015 has same level of redness. Doppler is pending, elevate extremity and give Lasix 1.  CKD Stage III At baseline of 1.9.  Schizophrenia Noted, cont oral antipsychotics  CLL WBC baseline within normal limits, leukocytosis likely secondary to the concurrent infection. This is improving.  Non Hodgkin's lymphoma ([place])- noted  Acute metabolic encephalopathy No focal neuro deficits. Due to infection, hypotension, mild acidosis. This is resolved   Code Status: Full Code Family Communication: Plan discussed with the patient. Disposition Plan: Remains inpatient Diet: Diet Heart Room  service appropriate?: Yes; Fluid consistency:: Thin  Consultants:  None  Procedures:  None  Antibiotics:  Zosyn and Vancomycin   Objective: Filed Vitals:   02/14/16 2213 02/15/16 0642  BP: 136/55 116/58  Pulse: 93 97  Temp: 99 F (37.2 C) 97.7 F (36.5 C)  Resp: 18 18    Intake/Output Summary (Last 24 hours) at 02/15/16 1314 Last data filed at 02/15/16 1226  Gross per 24 hour  Intake   1500 ml  Output   2750 ml  Net  -1250 ml   Filed Weights   02/12/16 1857 02/13/16 0100  Weight: 124.739 kg (275 lb) 131.3 kg (289 lb 7.4 oz)    Exam: General: Alert and awake, oriented x3, not in any acute distress. HEENT: anicteric sclera, pupils reactive to light and accommodation, EOMI CVS: S1-S2 clear, no murmur rubs or gallops Chest: clear to auscultation bilaterally, no wheezing, rales or rhonchi Abdomen: soft nontender, nondistended, normal bowel sounds, no organomegaly Extremities: no cyanosis, clubbing or edema noted bilaterally Neuro: Cranial nerves II-XII intact, no focal neurological deficits  Data Reviewed: Basic Metabolic Panel:  Recent Labs Lab 02/12/16 1929 02/12/16 1941 02/13/16 0551 02/14/16 0305 02/15/16 0355  NA 142 142 141 143 140  K 4.2 4.0 4.9 4.1 4.2  CL 108 106 113* 112* 109  CO2 22  --  23 23 22   GLUCOSE 161* 157* 142* 136* 157*  BUN 30* 30* 28* 24* 19  CREATININE 2.14* 1.90* 1.94* 1.94* 1.73*  CALCIUM 9.6  --  8.4* 8.6* 9.1   Liver Function Tests:  Recent Labs Lab 02/12/16 1929  AST 23  ALT 19  ALKPHOS 83  BILITOT 0.4  PROT 6.8  ALBUMIN 4.2    Recent Labs Lab 02/12/16 1929  LIPASE 38   No results for input(s): AMMONIA  in the last 168 hours. CBC:  Recent Labs Lab 02/12/16 1929 02/12/16 1941 02/13/16 0551 02/14/16 0305 02/15/16 0355  WBC 16.1*  --  26.7* 14.3* 10.4  NEUTROABS  --   --  25.4*  --   --   HGB 14.4 15.6 12.7* 12.2* 12.1*  HCT 42.6 46.0 38.3* 36.7* 36.2*  MCV 91.2  --  93.6 92.9 91.9  PLT 111*  --   125* 112* 125*   Cardiac Enzymes:  Recent Labs Lab 02/13/16 0215  TROPONINI <0.03   BNP (last 3 results) No results for input(s): BNP in the last 8760 hours.  ProBNP (last 3 results) No results for input(s): PROBNP in the last 8760 hours.  CBG: No results for input(s): GLUCAP in the last 168 hours.  Micro Recent Results (from the past 240 hour(s))  Blood Culture (routine x 2)     Status: None (Preliminary result)   Collection Time: 02/12/16  7:35 PM  Result Value Ref Range Status   Specimen Description BLOOD RIGHT ARM  Final   Special Requests BOTTLES DRAWN AEROBIC AND ANAEROBIC 5CC  Final   Culture   Final    NO GROWTH 1 DAY Performed at [facility]    Report Status PENDING  Incomplete  Blood Culture (routine x 2)     Status: None (Preliminary result)   Collection Time: 02/12/16  7:36 PM  Result Value Ref Range Status   Specimen Description BLOOD LEFT ARM  Final   Special Requests BOTTLES DRAWN AEROBIC AND ANAEROBIC 5CC  Final   Culture   Final    NO GROWTH 1 DAY Performed at [facility]    Report Status PENDING  Incomplete  MRSA PCR Screening     Status: None   Collection Time: 02/13/16  1:30 AM  Result Value Ref Range Status   MRSA by PCR NEGATIVE NEGATIVE Final    Comment:        The GeneXpert MRSA Assay (FDA approved for NASAL specimens only), is one component of a comprehensive MRSA colonization surveillance program. It is not intended to diagnose MRSA infection nor to guide or monitor treatment for MRSA infections.   Urine culture     Status: None   Collection Time: 02/13/16  6:58 AM  Result Value Ref Range Status   Specimen Description URINE, CLEAN CATCH  Final   Special Requests NONE  Final   Culture   Final    NO GROWTH 1 DAY Performed at [facility]    Report Status 02/14/2016 FINAL  Final     Studies: No results found.  Scheduled Meds: . antiseptic oral rinse  7 mL Mouth Rinse BID  . chlorproMAZINE  150 mg  Oral Q24H  . chlorproMAZINE  50 mg Oral Q breakfast  . clozapine  50 mg Oral BID  . enoxaparin (LOVENOX) injection  65 mg Subcutaneous Q24H  . guaiFENesin  1,200 mg Oral BID  . hydrALAZINE  5 mg Intravenous Once  . piperacillin-tazobactam (ZOSYN)  IV  3.375 g Intravenous Q8H   Continuous Infusions:       Time spent: 35 minutes    [facility] A  [facility] Pager [phone] If 7PM-7AM, please contact night-coverage at [email or web address], password [facility] 02/15/2016, 1:14 PM  LOS: 3 days

## 2016-02-15 NOTE — Evaluation (Signed)
Physical Therapy Evaluation Patient Details Name: [name] MRN: [id number] DOB: 14-Mar-1951 Today's Date: 02/15/2016   History of Present Illness  pt admitted with spesis, history of schizphrenia and redness and swelling of right lower leg.  Pt has been improving  this admission   Clinical Impression  Pt pleasant and cooperative with PT but needed frequent cues for safety.  He demonstrated fatigue with walking with RW but O2 sats were 98% on room air.  He will benefit from continued PT when he returns to group home to return to prior level of independence     Follow Up Recommendations Home health PT    Equipment Recommendations  Rolling walker with 5" wheels (wide width)    Recommendations for Other Services OT consult     Precautions / Restrictions Precautions Precautions: Fall Restrictions Weight Bearing Restrictions: No      Mobility  Bed Mobility Overal bed mobility: Modified Independent             General bed mobility comments: uses bedrails, extra time   Transfers Overall transfer level: Needs assistance Equipment used: Rolling walker (2 wheeled) Transfers: Sit to/from Stand Sit to Stand: Min assist         General transfer comment: needs consistent verbal cues for safety   Ambulation/Gait Ambulation/Gait assistance: Min assist Ambulation Distance (Feet): 50 Feet Assistive device: Rolling walker (2 wheeled) Gait Pattern/deviations: Step-to pattern;Trunk flexed     General Gait Details: pt repeatedly states he does not need walker, but he leaned heavily on it,and pushed it in front of him.  Needs repeated cues to step up and into walker.  He denied pain in right leg with walking . He appeaed to fatigue and needed extra cues for saftey especially at end of walk  O2 sats 98% on room air at end of walk   Stairs            Wheelchair Mobility    Modified Rankin (Stroke Patients Only)       Balance Overall balance assessment: Modified  Independent                                           Pertinent Vitals/Pain Pain Assessment: No/denies pain    Home Living Family/patient expects to be discharged to:: Group home                      Prior Function Level of Independence: Independent               Hand Dominance        Extremity/Trunk Assessment   Upper Extremity Assessment: Overall WFL for tasks assessed (generalized deconditioning)           Lower Extremity Assessment: RLE deficits/detail (generalized deconditioining) RLE Deficits / Details: pt with circumferential redness from below knee to ankle with larger circumference of whole leg Pitting edema in foot and ankle.     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     Exercises        Assessment/Plan    PT Assessment Patient needs continued PT services  PT Diagnosis Difficulty walking;Generalized weakness   PT Problem List Decreased strength;Decreased activity tolerance;Decreased knowledge of use of DME;Decreased safety awareness  PT Treatment Interventions DME instruction;Gait training;Functional mobility training;Therapeutic activities;Therapeutic exercise;Patient/family education   PT Goals (Current goals can be found in the Care Plan section) Acute Rehab PT Goals Patient Stated Goal: to go home  PT Goal Formulation: With patient Time For  Goal Achievement: 02/22/16 Potential to Achieve Goals: Good    Frequency Min 3X/week   Barriers to discharge        Co-evaluation               End of Session Equipment Utilized During Treatment: Gait belt Activity Tolerance: Patient limited by fatigue Patient left: in chair;with call bell/phone within reach;with chair alarm set           Time: 0935-1000 PT Time Calculation (min) (ACUTE ONLY): 25 min   Charges:   PT Evaluation $PT Eval Low Complexity: 1 Procedure     PT G Codes:       Fumiko Cham K. Owens Shark, PT   02/15/2016, 10:17 AM

## 2016-02-15 NOTE — Progress Notes (Signed)
Pharmacy Antibiotic Note  [name] is a 65 y.o. male admitted on 02/12/2016 with sepsis.  Pharmacy has been consulted for vancomycin and Zosyn dosing.  Abx narrowed to Zosyn monotherapy on 3/31. SCr improving, near baseline per notes.   Plan: Continue Zosyn 3.375g IV q8h (infuse over 4 hours) Follow up renal function & cultures De-escalate as appropriate  Height: 6\' 1"  (185.4 cm) Weight: 289 lb 7.4 oz (131.3 kg) IBW/kg (Calculated) : 79.9  Temp (24hrs), Avg:98.2 F (36.8 C), Min:97.7 F (36.5 C), Max:99 F (37.2 C)   Recent Labs Lab 02/12/16 1929 02/12/16 1941 02/12/16 1942 02/13/16 0215 02/13/16 0551 02/14/16 0305 02/15/16 0355  WBC 16.1*  --   --   --  26.7* 14.3* 10.4  CREATININE 2.14* 1.90*  --   --  1.94* 1.94* 1.73*  LATICACIDVEN  --   --  2.98* 2.8* 2.2*  --   --     Estimated Creatinine Clearance: 62.9 mL/min (by C-G formula based on Cr of 1.73).   CrCl 40 ml/min/1.61m2 (normalized)  No Known Allergies  Antimicrobials this admission: 3/29 >> Vanc >> 3/31 3/29 >> Zosyn >>  Dose adjustments this admission: ---  Microbiology results: 3/29 BCx: NGTD 3/29 UCx: NGF 3/30 MRSA PCR: negative  Thank you for allowing pharmacy to be a part of this patient's care.  Ralene Bathe, PharmD, BCPS 02/15/2016, 1:04 PM  Pager: [phone]

## 2016-02-16 LAB — BASIC METABOLIC PANEL
ANION GAP: 9 (ref 5–15)
BUN: 19 mg/dL (ref 6–20)
CHLORIDE: 111 mmol/L (ref 101–111)
CO2: 26 mmol/L (ref 22–32)
Calcium: 9.8 mg/dL (ref 8.9–10.3)
Creatinine, Ser: 1.75 mg/dL — ABNORMAL HIGH (ref 0.61–1.24)
GFR calc Af Amer: 46 mL/min — ABNORMAL LOW (ref >60)
GFR calc non Af Amer: 40 mL/min — ABNORMAL LOW (ref >60)
GLUCOSE: 144 mg/dL — AB (ref 65–99)
POTASSIUM: 4.1 mmol/L (ref 3.5–5.1)
Sodium: 146 mmol/L — ABNORMAL HIGH (ref 135–145)

## 2016-02-16 MED ORDER — FUROSEMIDE 10 MG/ML IJ SOLN
40.0000 mg | Freq: Once | INTRAMUSCULAR | Status: AC
  Administered 2016-02-16: 40 mg via INTRAVENOUS
  Filled 2016-02-16: qty 4

## 2016-02-16 NOTE — Progress Notes (Signed)
[facility] PROGRESS NOTE   [name] [id number] DOB: 22-Apr-1951 DOA: 02/12/2016 PCP: Janeth Rase, MD  HPI/Subjective: No complaints overnight, feels much better. Right lower extremity without evidence of DVT but still red and swollen. Repeat Lasix today.  Assessment/Plan: Principal Problem:   Sepsis ([place]) Active Problems:   CKD (chronic kidney disease), stage III   Schizophrenia (HCC)   CLL (chronic lymphocytic leukemia) (HCC)   Non Hodgkin's lymphoma (HCC)   Cellulitis of right lower extremity   PNA (pneumonia)   Sepsis Sepsis present on admission, fever 100.7 and heart rate of 117 and possible infection.  Initial lactic acid 2.98, repeat was 2.2. On vancomycin and Zosyn.  Initially admitted to stepdown, blood pressure is improved after IV fluid resuscitation. Continue the current antibiotics. Lactic acid was 2.9 on admission, procalcitonin is 26.4. MRSA by PCR is negative, on Augmentin since 02/15/2016.  Pneumonia X-ray shows bibasilar pneumonia probable bronchopneumonia versus aspiration. Patient is on vancomycin and Zosyn. Continue current supportive management with bronchodilators, mucolytics, antitussives and oxygen as needed.  Cellulitis of right lower extremity Redness, swelling and tenderness. This appears to be chronic, on hospital admission from May 2015 has same level of redness. Doppler ultrasound is negative for DVT, continue extremity elevation and repeat Lasix today.  CKD Stage III At baseline of 1.9.  Schizophrenia Noted, cont oral antipsychotics  CLL WBC baseline within normal limits, leukocytosis likely secondary to the concurrent infection. This is improving.  Non Hodgkin's lymphoma ([place])- noted  Acute metabolic encephalopathy No focal neuro deficits. Due to infection, hypotension, mild acidosis. This is resolved   Code Status: Full Code Family Communication: Plan discussed with the patient. Disposition Plan:  Remains inpatient Diet: Diet Heart Room service appropriate?: Yes; Fluid consistency:: Thin  Consultants:  None  Procedures:  None  Antibiotics:  Zosyn and Vancomycin   Objective: Filed Vitals:   02/15/16 2020 02/16/16 0519  BP: 91/43 91/41  Pulse: 88 89  Temp: 98 F (36.7 C) 98.2 F (36.8 C)  Resp: 16 16    Intake/Output Summary (Last 24 hours) at 02/16/16 1118 Last data filed at 02/16/16 0519  Gross per 24 hour  Intake    780 ml  Output   4850 ml  Net  -4070 ml   Filed Weights   02/12/16 1857 02/13/16 0100  Weight: 124.739 kg (275 lb) 131.3 kg (289 lb 7.4 oz)    Exam: General: Alert and awake, oriented x3, not in any acute distress. HEENT: anicteric sclera, pupils reactive to light and accommodation, EOMI CVS: S1-S2 clear, no murmur rubs or gallops Chest: clear to auscultation bilaterally, no wheezing, rales or rhonchi Abdomen: soft nontender, nondistended, normal bowel sounds, no organomegaly Extremities: no cyanosis, clubbing or edema noted bilaterally Neuro: Cranial nerves II-XII intact, no focal neurological deficits  Data Reviewed: Basic Metabolic Panel:  Recent Labs Lab 02/12/16 1929 02/12/16 1941 02/13/16 0551 02/14/16 0305 02/15/16 0355 02/16/16 0341  NA 142 142 141 143 140 146*  K 4.2 4.0 4.9 4.1 4.2 4.1  CL 108 106 113* 112* 109 111  CO2 22  --  23 23 22 26   GLUCOSE 161* 157* 142* 136* 157* 144*  BUN 30* 30* 28* 24* 19 19  CREATININE 2.14* 1.90* 1.94* 1.94* 1.73* 1.75*  CALCIUM 9.6  --  8.4* 8.6* 9.1 9.8   Liver Function Tests:  Recent Labs Lab 02/12/16 1929  AST 23  ALT 19  ALKPHOS 83  BILITOT 0.4  PROT 6.8  ALBUMIN 4.2  Recent Labs Lab 02/12/16 1929  LIPASE 38   No results for input(s): AMMONIA in the last 168 hours. CBC:  Recent Labs Lab 02/12/16 1929 02/12/16 1941 02/13/16 0551 02/14/16 0305 02/15/16 0355  WBC 16.1*  --  26.7* 14.3* 10.4  NEUTROABS  --   --  25.4*  --   --   HGB 14.4 15.6 12.7* 12.2*  12.1*  HCT 42.6 46.0 38.3* 36.7* 36.2*  MCV 91.2  --  93.6 92.9 91.9  PLT 111*  --  125* 112* 125*   Cardiac Enzymes:  Recent Labs Lab 02/13/16 0215  TROPONINI <0.03   BNP (last 3 results) No results for input(s): BNP in the last 8760 hours.  ProBNP (last 3 results) No results for input(s): PROBNP in the last 8760 hours.  CBG: No results for input(s): GLUCAP in the last 168 hours.  Micro Recent Results (from the past 240 hour(s))  Blood Culture (routine x 2)     Status: None (Preliminary result)   Collection Time: 02/12/16  7:35 PM  Result Value Ref Range Status   Specimen Description BLOOD RIGHT ARM  Final   Special Requests BOTTLES DRAWN AEROBIC AND ANAEROBIC 5CC  Final   Culture   Final    NO GROWTH 2 DAYS Performed at [facility]    Report Status PENDING  Incomplete  Blood Culture (routine x 2)     Status: None (Preliminary result)   Collection Time: 02/12/16  7:36 PM  Result Value Ref Range Status   Specimen Description BLOOD LEFT ARM  Final   Special Requests BOTTLES DRAWN AEROBIC AND ANAEROBIC 5CC  Final   Culture   Final    NO GROWTH 2 DAYS Performed at [facility]    Report Status PENDING  Incomplete  MRSA PCR Screening     Status: None   Collection Time: 02/13/16  1:30 AM  Result Value Ref Range Status   MRSA by PCR NEGATIVE NEGATIVE Final    Comment:        The GeneXpert MRSA Assay (FDA approved for NASAL specimens only), is one component of a comprehensive MRSA colonization surveillance program. It is not intended to diagnose MRSA infection nor to guide or monitor treatment for MRSA infections.   Urine culture     Status: None   Collection Time: 02/13/16  6:58 AM  Result Value Ref Range Status   Specimen Description URINE, CLEAN CATCH  Final   Special Requests NONE  Final   Culture   Final    NO GROWTH 1 DAY Performed at [facility]    Report Status 02/14/2016 FINAL  Final     Studies: No results  found.  Scheduled Meds: . amoxicillin-clavulanate  1 tablet Oral Q12H  . antiseptic oral rinse  7 mL Mouth Rinse BID  . chlorproMAZINE  150 mg Oral Q24H  . chlorproMAZINE  50 mg Oral Q breakfast  . clozapine  50 mg Oral BID  . enoxaparin (LOVENOX) injection  65 mg Subcutaneous Q24H  . guaiFENesin  1,200 mg Oral BID  . hydrALAZINE  5 mg Intravenous Once   Continuous Infusions:       Time spent: 35 minutes    [facility] A  [facility] Pager [phone] If 7PM-7AM, please contact night-coverage at [email or web address], password [facility] 02/16/2016, 11:18 AM  LOS: 4 days

## 2016-02-17 LAB — BASIC METABOLIC PANEL
Anion gap: 9 (ref 5–15)
BUN: 19 mg/dL (ref 6–20)
CALCIUM: 9.4 mg/dL (ref 8.9–10.3)
CO2: 27 mmol/L (ref 22–32)
Chloride: 106 mmol/L (ref 101–111)
Creatinine, Ser: 1.79 mg/dL — ABNORMAL HIGH (ref 0.61–1.24)
GFR calc Af Amer: 45 mL/min — ABNORMAL LOW (ref >60)
GFR, EST NON AFRICAN AMERICAN: 39 mL/min — AB (ref >60)
Glucose, Bld: 189 mg/dL — ABNORMAL HIGH (ref 65–99)
Potassium: 4 mmol/L (ref 3.5–5.1)
Sodium: 142 mmol/L (ref 135–145)

## 2016-02-17 MED ORDER — CEPHALEXIN 500 MG PO CAPS: 500.0000 mg | ORAL_CAPSULE | Freq: Three times a day (TID) | ORAL | Status: DC

## 2016-02-17 MED ORDER — DOXYCYCLINE HYCLATE 100 MG PO TABS
100.0000 mg | ORAL_TABLET | Freq: Two times a day (BID) | ORAL | Status: DC
Start: 2016-02-17 — End: 2016-06-06

## 2016-02-17 NOTE — Discharge Summary (Signed)
Physician Discharge Summary  [name] [id number] DOB: 1951-10-26 DOA: 02/12/2016  PCP: Janeth Rase, MD  Admit date: 02/12/2016 Discharge date: 02/17/2016  Time spent: 40 minutes  Recommendations for Outpatient Follow-up:  1. Follow-up with primary care physician within one week. 2. Discharge back to his group home   Discharge Diagnoses:  Principal Problem:   Sepsis ([place]) Active Problems:   CKD (chronic kidney disease), stage III   Schizophrenia (HCC)   CLL (chronic lymphocytic leukemia) (HCC)   Non Hodgkin's lymphoma (HCC)   Cellulitis of right lower extremity   PNA (pneumonia)   Discharge Condition: Stable  Diet recommendation: Heart healthy  Filed Weights   02/12/16 1857 02/13/16 0100 02/17/16 0648  Weight: 124.739 kg (275 lb) 131.3 kg (289 lb 7.4 oz) 132.586 kg (292 lb 4.8 oz)    History of present illness:  65 yo male h/o schizophrenia, lymphoma and CLL on chemo treatment, CKD, htn comes in with fever and chills. Pt is confused right now and delirious. He really cannot answer questions appropriately and his temp was high on arrival. Pt denies any pain and appears comfortable. With hypotension in ED, febrile. Pt being given vanc/zosyn and 4 liters ivf bolus per sepsis protocol. His bp seems to be responding to the ivf bolus. Pt referred for admission for sepsis, likely due to pna.   Of note, pt has rle cellulitis which he says has been red since 2009. It looks like a cellulitis, hot to touch. Suspect his history is inaccurate.  Hospital Course:   Sepsis Sepsis present on admission, fever 100.7 and heart rate of 117 and possible infection.  Initial lactic acid 2.98, repeat was 2.2. On vancomycin and Zosyn.  Initially admitted to stepdown, blood pressure is improved after IV fluid resuscitation. Continue the current antibiotics. Lactic acid was 2.9 on admission, procalcitonin is 26.4. Discharged on doxycycline and Keflex for 7 more  days.  Pneumonia X-ray shows bibasilar pneumonia probable bronchopneumonia versus aspiration. Patient is on vancomycin and Zosyn. On discharge doxycycline and Keflex for 7 more days.  Cellulitis of right lower extremity Redness, swelling and tenderness. This appears to be acute on chronic, on hospital admission from May 2016 has same level of redness. Doppler ultrasound is negative for DVT. Discharged on 7 more days of Keflex and doxycycline  CKD Stage III At baseline of 1.9.  Schizophrenia Noted, cont oral antipsychotics  CLL WBC baseline within normal limits, leukocytosis likely secondary to the concurrent infection. This is improving.  Non Hodgkin's lymphoma ([place])- noted  Acute metabolic encephalopathy No focal neuro deficits. Due to infection, hypotension, mild acidosis. This is resolved    Procedures:  None  Consultations:  None  Discharge Exam: Filed Vitals:   02/16/16 2016 02/17/16 0446  BP: 114/64 106/55  Pulse: 87 85  Temp: 98.2 F (36.8 C) 98.3 F (36.8 C)  Resp: 16 16   General: Alert and awake, oriented x3, not in any acute distress. HEENT: anicteric sclera, pupils reactive to light and accommodation, EOMI CVS: S1-S2 clear, no murmur rubs or gallops Chest: clear to auscultation bilaterally, no wheezing, rales or rhonchi Abdomen: soft nontender, nondistended, normal bowel sounds, no organomegaly Extremities: no cyanosis, clubbing or edema noted bilaterally Neuro: Cranial nerves II-XII intact, no focal neurological deficits     Discharge Instructions   Discharge Instructions    Diet - low sodium heart healthy    Complete by:  As directed      Increase activity slowly    Complete by:  As directed           Current Discharge Medication List    START taking these medications   Details  cephALEXin (KEFLEX) 500 MG capsule Take 1 capsule (500 mg total) by mouth 3 (three) times daily. Qty: 21 capsule, Refills: 0    doxycycline  (VIBRA-TABS) 100 MG tablet Take 1 tablet (100 mg total) by mouth 2 (two) times daily. Qty: 14 tablet, Refills: 0      CONTINUE these medications which have NOT CHANGED   Details  aspirin EC 81 MG tablet Take 81 mg by mouth daily.    !! chlorproMAZINE (THORAZINE) 100 MG tablet Take 100 mg by mouth every evening.    !! chlorproMAZINE (THORAZINE) 50 MG tablet Take 50 mg by mouth 2 (two) times daily.    cholecalciferol (VITAMIN D) 1000 UNITS tablet Take 5,000 Units by mouth daily.     !! cloZAPine (CLOZARIL) 100 MG tablet Take 200 mg by mouth at bedtime.     !! clozapine (CLOZARIL) 50 MG tablet Take 50 mg by mouth 2 (two) times daily.    docusate sodium (COLACE) 100 MG capsule Take 100 mg by mouth daily.    escitalopram (LEXAPRO) 10 MG tablet Take 10 mg by mouth daily before breakfast.     ezetimibe (ZETIA) 10 MG tablet Take 10 mg by mouth daily.    ketoconazole (NIZORAL) 2 % cream Apply 1 application topically 2 (two) times daily.     lamoTRIgine (LAMICTAL) 25 MG tablet Take 50 mg by mouth 2 (two) times daily.     levothyroxine (SYNTHROID, LEVOTHROID) 100 MCG tablet Take 100 mcg by mouth daily before breakfast.     mineral oil liquid Place into the right ear once a week. Instill 1-2 drops into the right ear once weekly.    Omega-3 Fatty Acids (FISH OIL) 1000 MG CAPS Take 4,000 mg by mouth daily.    polyethylene glycol (MIRALAX / GLYCOLAX) packet Take 17 g by mouth daily.    senna (SENOKOT) 8.6 MG tablet Take 1 tablet by mouth daily.     !! - Potential duplicate medications found. Please discuss with provider.     No Known Allergies Follow-up Information    Follow up with Janeth Rase, MD In 1 week.   Specialty:  Family Medicine   Contact information:   [place] [address] [phone]        The results of significant diagnostics from this hospitalization (including imaging, microbiology, ancillary and laboratory) are listed below for reference.     Significant Diagnostic Studies: Dg Chest 1 View  02/12/2016  CLINICAL DATA:  Chills and vomiting.  Chronic leukemia EXAM: CHEST 1 VIEW COMPARISON:  07/23/2015 FINDINGS: Heart size is normal. Mediastinal shadows are normal. There is abnormal density at both lung bases left more than right. This could be due to bronchopneumonia or aspiration. The may be a small amount of pleural fluid on the left. No evidence heart failure. IMPRESSION: Abnormal density at the lung bases left more than right. This could be bronchopneumonia or aspiration. Small amount of pleural fluid on the left. Electronically Signed   By: Nelson Chimes M.D.   On: 02/12/2016 21:16   Dg Abd 1 View  02/12/2016  CLINICAL DATA:  Acute onset of chills and vomiting. Initial encounter. EXAM: ABDOMEN - 1 VIEW COMPARISON:  Abdominal radiograph performed 03/27/2015 FINDINGS: The visualized bowel gas pattern is unremarkable. Scattered air and stool filled loops of colon are seen; no  abnormal dilatation of small bowel loops is seen to suggest small bowel obstruction. No free intra-abdominal air is identified, though evaluation for free air is limited on supine views. The visualized osseous structures are within normal limits; the sacroiliac joints are unremarkable in appearance. The visualized lung bases are essentially clear. IMPRESSION: Unremarkable bowel gas pattern; no free intra-abdominal air seen. Moderate amount of stool noted in the colon. Electronically Signed   By: Garald Balding M.D.   On: 02/12/2016 21:21    Microbiology: Recent Results (from the past 240 hour(s))  Blood Culture (routine x 2)     Status: None (Preliminary result)   Collection Time: 02/12/16  7:35 PM  Result Value Ref Range Status   Specimen Description BLOOD RIGHT ARM  Final   Special Requests BOTTLES DRAWN AEROBIC AND ANAEROBIC 5CC  Final   Culture   Final    NO GROWTH 4 DAYS Performed at [facility]    Report Status PENDING  Incomplete  Blood Culture  (routine x 2)     Status: None (Preliminary result)   Collection Time: 02/12/16  7:36 PM  Result Value Ref Range Status   Specimen Description BLOOD LEFT ARM  Final   Special Requests BOTTLES DRAWN AEROBIC AND ANAEROBIC 5CC  Final   Culture   Final    NO GROWTH 4 DAYS Performed at [facility]    Report Status PENDING  Incomplete  MRSA PCR Screening     Status: None   Collection Time: 02/13/16  1:30 AM  Result Value Ref Range Status   MRSA by PCR NEGATIVE NEGATIVE Final    Comment:        The GeneXpert MRSA Assay (FDA approved for NASAL specimens only), is one component of a comprehensive MRSA colonization surveillance program. It is not intended to diagnose MRSA infection nor to guide or monitor treatment for MRSA infections.   Urine culture     Status: None   Collection Time: 02/13/16  6:58 AM  Result Value Ref Range Status   Specimen Description URINE, CLEAN CATCH  Final   Special Requests NONE  Final   Culture   Final    NO GROWTH 1 DAY Performed at [facility]    Report Status 02/14/2016 FINAL  Final     Labs: Basic Metabolic Panel:  Recent Labs Lab 02/13/16 0551 02/14/16 0305 02/15/16 0355 02/16/16 0341 02/17/16 0350  NA 141 143 140 146* 142  K 4.9 4.1 4.2 4.1 4.0  CL 113* 112* 109 111 106  CO2 23 23 22 26 27   GLUCOSE 142* 136* 157* 144* 189*  BUN 28* 24* 19 19 19   CREATININE 1.94* 1.94* 1.73* 1.75* 1.79*  CALCIUM 8.4* 8.6* 9.1 9.8 9.4   Liver Function Tests:  Recent Labs Lab 02/12/16 1929  AST 23  ALT 19  ALKPHOS 83  BILITOT 0.4  PROT 6.8  ALBUMIN 4.2    Recent Labs Lab 02/12/16 1929  LIPASE 38   No results for input(s): AMMONIA in the last 168 hours. CBC:  Recent Labs Lab 02/12/16 1929 02/12/16 1941 02/13/16 0551 02/14/16 0305 02/15/16 0355  WBC 16.1*  --  26.7* 14.3* 10.4  NEUTROABS  --   --  25.4*  --   --   HGB 14.4 15.6 12.7* 12.2* 12.1*  HCT 42.6 46.0 38.3* 36.7* 36.2*  MCV 91.2  --  93.6 92.9 91.9   PLT 111*  --  125* 112* 125*   Cardiac Enzymes:  Recent  Labs Lab 02/13/16 0215  TROPONINI <0.03   BNP: BNP (last 3 results) No results for input(s): BNP in the last 8760 hours.  ProBNP (last 3 results) No results for input(s): PROBNP in the last 8760 hours.  CBG: No results for input(s): GLUCAP in the last 168 hours.     Signed:  Birdie Hopes MD.  [facility] 02/17/2016, 1:38 PM

## 2016-02-17 NOTE — Progress Notes (Signed)
Patient is set to discharge back to [facility] today. [name] at facility & guardian made aware. CSW sent over discharge summary for facility to review. RN, Beverlee Nims made aware. Guardian to transport back to facility today.      Raynaldo Opitz, [facility] Clinical Social Worker cell #: [phone]

## 2016-02-17 NOTE — Progress Notes (Signed)
Patient discharged to home, all discharge medications and instructions reviewed and questions answered with patient guardian. Patient assisted to vehicle by wheelchair.

## 2016-02-17 NOTE — Care Management Note (Signed)
Case Management Note  Patient Details  Name: [name] MRN: [id number] Date of Birth: 1951/07/24  Subjective/Objective:     65 yo admitted with Sepsis               Action/Plan: [facility] from [facility]  Expected Discharge Date:   (unknown)               Expected Discharge Plan:  Group Home  In-House Referral:  Clinical Social Work  Discharge planning Services  CM Consult  Post Acute Care Choice:  Home Health Choice offered to:  [facility] / Guardian  DME Arranged:    DME Agency:     HH Arranged:  PT, OT HH Agency:  [place]  Status of Service:  Completed, signed off  Medicare Important Message Given:    Date Medicare IM Given:    Medicare IM give by:    Date Additional Medicare IM Given:    Additional Medicare Important Message give by:     If discussed at [place] of Stay Meetings, dates discussed:    Additional Comments: PT recommendations are for HHPT/OT.  This CM contacted Group home to see which [name] they use.  Per [facility] rep they use [facility].  [facility] rep contacted for referral and orders written.  No other CM needs communicated. Lynnell Catalan, RN 02/17/2016, 12:31 PM [phone]

## 2016-02-17 NOTE — Progress Notes (Signed)
Physical Therapy Treatment Patient Details Name: NATHANYL KARAGIANNIS MRN: [id number] DOB: 1951-09-22 Today's Date: 02/17/2016    History of Present Illness pt admitted with spesis, history of schizphrenia and redness and swelling of right lower leg.  Pt has been improving  this admission     PT Comments    Pt progressing although seems he is much weaker than his baseline--(?)  Follow Up Recommendations  Home health PT;Supervision for mobility/OOB     Equipment Recommendations  Rolling walker with 5" wheels    Recommendations for Other Services       Precautions / Restrictions Precautions Precautions: Fall Restrictions Weight Bearing Restrictions: No    Mobility  Bed Mobility Overal bed mobility: Modified Independent             General bed mobility comments: uses bedrails, extra time   Transfers Overall transfer level: Needs assistance Equipment used: Rolling walker (2 wheeled) Transfers: Sit to/from Stand Sit to Stand: Min guard         General transfer comment: verbal cues for hand placement and safety with stand to sit; min/guard for wt shift to stand  Ambulation/Gait Ambulation/Gait assistance: Min guard;Min assist Ambulation Distance (Feet): 60 Feet Assistive device: Rolling walker (2 wheeled) Gait Pattern/deviations: Step-through pattern;Decreased stride length;Trunk flexed     General Gait Details: pt states he is not "febile" and does not need RW however he leans on it heavily during gait, needs repetitious cues for RW position and distance from self; chair pulled for safety, pt became unsteady with slight knee buckling, so was allowed to sit; pt denied being fatigued or needing to sit but did ask to return to room   Stairs            Wheelchair Mobility    Modified Rankin (Stroke Patients Only)       Balance Overall balance assessment: Needs assistance   Sitting balance-Leahy Scale: Fair (NT further)     Standing balance support:  Single extremity supported Standing balance-Leahy Scale: Poor Standing balance comment: reliant on at least one UE to maintain balance                    Cognition   Behavior During Therapy: [facility] for tasks assessed/performed Overall Cognitive Status: Impaired/Different from baseline (pt repeats self, states he has lost his mind, he is"mental") Area of Impairment: Following commands;Awareness;Problem solving       Following Commands: Follows one step commands with increased time     Problem Solving: Slow processing;Decreased initiation;Difficulty sequencing;Requires verbal cues      Exercises      General Comments        Pertinent Vitals/Pain Pain Assessment: No/denies pain    Home Living                      Prior Function            PT Goals (current goals can now be found in the care plan section) Acute Rehab PT Goals Patient Stated Goal: to go home  PT Goal Formulation: With patient Time For Goal Achievement: 02/22/16 Potential to Achieve Goals: Good Progress towards PT goals: Progressing toward goals    Frequency  Min 3X/week    PT Plan Current plan remains appropriate    Co-evaluation             End of Session Equipment Utilized During Treatment: Gait belt Activity Tolerance: Patient limited by fatigue Patient left: in chair;with call  bell/phone within reach;with chair alarm set     Time: [phone] PT Time Calculation (min) (ACUTE ONLY): 13 min  Charges:  $Gait Training: 8-22 mins                    G Codes:      Philomina Leon 2016-02-25, 11:00 AM

## 2016-02-18 LAB — CULTURE, BLOOD (ROUTINE X 2)
CULTURE: NO GROWTH
CULTURE: NO GROWTH

## 2016-06-03 ENCOUNTER — Emergency Department (HOSPITAL_COMMUNITY): Payer: Medicare Other

## 2016-06-03 ENCOUNTER — Inpatient Hospital Stay (HOSPITAL_COMMUNITY)
Admission: EM | Admit: 2016-06-03 | Discharge: 2016-06-06 | DRG: 871 | Disposition: A | Discharge status: Home Health | Payer: Medicare Other | Attending: Family Medicine | Admitting: Family Medicine

## 2016-06-03 ENCOUNTER — Encounter (HOSPITAL_COMMUNITY): Payer: Self-pay | Admitting: Emergency Medicine

## 2016-06-03 ENCOUNTER — Emergency Department (HOSPITAL_BASED_OUTPATIENT_CLINIC_OR_DEPARTMENT_OTHER)
Admit: 2016-06-03 | Discharge: 2016-06-03 | Disposition: A | Discharge status: Home / Self Care | Payer: Medicare Other | Attending: Emergency Medicine | Admitting: Emergency Medicine

## 2016-06-03 DIAGNOSIS — N183 Chronic kidney disease, stage 3 (moderate): Secondary | ICD-10-CM | POA: Diagnosis present

## 2016-06-03 DIAGNOSIS — M79604 Pain in right leg: Secondary | ICD-10-CM

## 2016-06-03 DIAGNOSIS — N170 Acute kidney failure with tubular necrosis: Secondary | ICD-10-CM | POA: Diagnosis present

## 2016-06-03 DIAGNOSIS — F209 Schizophrenia, unspecified: Secondary | ICD-10-CM | POA: Diagnosis not present

## 2016-06-03 DIAGNOSIS — E872 Acidosis: Secondary | ICD-10-CM | POA: Diagnosis present

## 2016-06-03 DIAGNOSIS — Z6841 Body Mass Index (BMI) 40.0 and over, adult: Secondary | ICD-10-CM

## 2016-06-03 DIAGNOSIS — Z79899 Other long term (current) drug therapy: Secondary | ICD-10-CM | POA: Diagnosis not present

## 2016-06-03 DIAGNOSIS — R6521 Severe sepsis with septic shock: Secondary | ICD-10-CM | POA: Diagnosis present

## 2016-06-03 DIAGNOSIS — L03115 Cellulitis of right lower limb: Secondary | ICD-10-CM | POA: Diagnosis present

## 2016-06-03 DIAGNOSIS — Z856 Personal history of leukemia: Secondary | ICD-10-CM

## 2016-06-03 DIAGNOSIS — E559 Vitamin D deficiency, unspecified: Secondary | ICD-10-CM | POA: Diagnosis present

## 2016-06-03 DIAGNOSIS — Z8572 Personal history of non-Hodgkin lymphomas: Secondary | ICD-10-CM

## 2016-06-03 DIAGNOSIS — J189 Pneumonia, unspecified organism: Secondary | ICD-10-CM | POA: Diagnosis present

## 2016-06-03 DIAGNOSIS — I129 Hypertensive chronic kidney disease with stage 1 through stage 4 chronic kidney disease, or unspecified chronic kidney disease: Secondary | ICD-10-CM | POA: Diagnosis present

## 2016-06-03 DIAGNOSIS — Z9221 Personal history of antineoplastic chemotherapy: Secondary | ICD-10-CM

## 2016-06-03 DIAGNOSIS — R0902 Hypoxemia: Secondary | ICD-10-CM | POA: Diagnosis present

## 2016-06-03 DIAGNOSIS — F200 Paranoid schizophrenia: Secondary | ICD-10-CM | POA: Diagnosis present

## 2016-06-03 DIAGNOSIS — K5909 Other constipation: Secondary | ICD-10-CM | POA: Diagnosis present

## 2016-06-03 DIAGNOSIS — Z7982 Long term (current) use of aspirin: Secondary | ICD-10-CM

## 2016-06-03 DIAGNOSIS — E785 Hyperlipidemia, unspecified: Secondary | ICD-10-CM | POA: Diagnosis present

## 2016-06-03 DIAGNOSIS — E039 Hypothyroidism, unspecified: Secondary | ICD-10-CM | POA: Diagnosis present

## 2016-06-03 DIAGNOSIS — D72829 Elevated white blood cell count, unspecified: Secondary | ICD-10-CM | POA: Diagnosis not present

## 2016-06-03 DIAGNOSIS — N179 Acute kidney failure, unspecified: Secondary | ICD-10-CM

## 2016-06-03 DIAGNOSIS — K219 Gastro-esophageal reflux disease without esophagitis: Secondary | ICD-10-CM | POA: Diagnosis present

## 2016-06-03 DIAGNOSIS — A419 Sepsis, unspecified organism: Principal | ICD-10-CM | POA: Diagnosis present

## 2016-06-03 DIAGNOSIS — N189 Chronic kidney disease, unspecified: Secondary | ICD-10-CM | POA: Diagnosis not present

## 2016-06-03 DIAGNOSIS — E669 Obesity, unspecified: Secondary | ICD-10-CM | POA: Diagnosis present

## 2016-06-03 DIAGNOSIS — R61 Generalized hyperhidrosis: Secondary | ICD-10-CM | POA: Diagnosis present

## 2016-06-03 DIAGNOSIS — R579 Shock, unspecified: Secondary | ICD-10-CM | POA: Diagnosis not present

## 2016-06-03 LAB — GLUCOSE, CAPILLARY
GLUCOSE-CAPILLARY: 125 mg/dL — AB (ref 65–99)
GLUCOSE-CAPILLARY: 112 mg/dL — AB (ref 65–99)

## 2016-06-03 LAB — CBC WITH DIFFERENTIAL/PLATELET
BASOS PCT: 0 %
Basophils Absolute: 0 10*3/uL (ref 0.0–0.1)
EOS ABS: 0 10*3/uL (ref 0.0–0.7)
EOS PCT: 0 %
HCT: 39.9 % (ref 39.0–52.0)
HEMOGLOBIN: 13.1 g/dL (ref 13.0–17.0)
LYMPHS ABS: 0.6 10*3/uL — AB (ref 0.7–4.0)
Lymphocytes Relative: 2 %
MCH: 30.8 pg (ref 26.0–34.0)
MCHC: 32.8 g/dL (ref 30.0–36.0)
MCV: 93.9 fL (ref 78.0–100.0)
Monocytes Absolute: 1 10*3/uL (ref 0.1–1.0)
Monocytes Relative: 4 %
NEUTROS PCT: 94 %
Neutro Abs: 22.3 10*3/uL — ABNORMAL HIGH (ref 1.7–7.7)
PLATELETS: 141 10*3/uL — AB (ref 150–400)
RBC: 4.25 MIL/uL (ref 4.22–5.81)
RDW: 13.4 % (ref 11.5–15.5)
WBC: 23.9 10*3/uL — AB (ref 4.0–10.5)

## 2016-06-03 LAB — COMPREHENSIVE METABOLIC PANEL
ALK PHOS: 66 U/L (ref 38–126)
ALT: 21 U/L (ref 17–63)
AST: 27 U/L (ref 15–41)
Albumin: 3.9 g/dL (ref 3.5–5.0)
Anion gap: 11 (ref 5–15)
BILIRUBIN TOTAL: 0.8 mg/dL (ref 0.3–1.2)
BUN: 27 mg/dL — AB (ref 6–20)
CALCIUM: 9.4 mg/dL (ref 8.9–10.3)
CHLORIDE: 106 mmol/L (ref 101–111)
CO2: 22 mmol/L (ref 22–32)
CREATININE: 2.52 mg/dL — AB (ref 0.61–1.24)
GFR, EST AFRICAN AMERICAN: 30 mL/min — AB (ref >60)
GFR, EST NON AFRICAN AMERICAN: 26 mL/min — AB (ref >60)
Glucose, Bld: 185 mg/dL — ABNORMAL HIGH (ref 65–99)
Potassium: 3.9 mmol/L (ref 3.5–5.1)
Sodium: 139 mmol/L (ref 135–145)
TOTAL PROTEIN: 6.2 g/dL — AB (ref 6.5–8.1)

## 2016-06-03 LAB — URINALYSIS, ROUTINE W REFLEX MICROSCOPIC
BILIRUBIN URINE: NEGATIVE
Glucose, UA: NEGATIVE mg/dL
Hgb urine dipstick: NEGATIVE
Ketones, ur: NEGATIVE mg/dL
Leukocytes, UA: NEGATIVE
NITRITE: NEGATIVE
PROTEIN: NEGATIVE mg/dL
SPECIFIC GRAVITY, URINE: 1.014 (ref 1.005–1.030)
pH: 5 (ref 5.0–8.0)

## 2016-06-03 LAB — LACTIC ACID, PLASMA: Lactic Acid, Venous: 2.8 mmol/L (ref 0.5–1.9)

## 2016-06-03 LAB — TROPONIN I: Troponin I: 0.06 ng/mL (ref <0.03)

## 2016-06-03 LAB — I-STAT CG4 LACTIC ACID, ED
LACTIC ACID, VENOUS: 3.72 mmol/L — AB (ref 0.5–1.9)
Lactic Acid, Venous: 3.95 mmol/L (ref 0.5–1.9)

## 2016-06-03 LAB — MRSA PCR SCREENING: MRSA by PCR: NEGATIVE

## 2016-06-03 LAB — I-STAT TROPONIN, ED: TROPONIN I, POC: 0 ng/mL (ref 0.00–0.08)

## 2016-06-03 MED ORDER — ASPIRIN EC 81 MG PO TBEC
81.0000 mg | DELAYED_RELEASE_TABLET | Freq: Every day | ORAL | Status: DC
  Administered 2016-06-03 – 2016-06-06 (×4): 81 mg via ORAL
  Filled 2016-06-03 (×4): qty 1

## 2016-06-03 MED ORDER — LAMOTRIGINE 25 MG PO TABS
50.0000 mg | ORAL_TABLET | Freq: Two times a day (BID) | ORAL | Status: DC
  Administered 2016-06-03 – 2016-06-06 (×6): 50 mg via ORAL
  Filled 2016-06-03 (×6): qty 2

## 2016-06-03 MED ORDER — PANTOPRAZOLE SODIUM 40 MG PO TBEC
40.0000 mg | DELAYED_RELEASE_TABLET | Freq: Every day | ORAL | Status: DC
  Administered 2016-06-04 – 2016-06-06 (×3): 40 mg via ORAL
  Filled 2016-06-03 (×3): qty 1

## 2016-06-03 MED ORDER — EZETIMIBE 10 MG PO TABS
10.0000 mg | ORAL_TABLET | Freq: Every day | ORAL | Status: DC
  Administered 2016-06-04 – 2016-06-06 (×3): 10 mg via ORAL
  Filled 2016-06-03 (×3): qty 1

## 2016-06-03 MED ORDER — HEPARIN SODIUM (PORCINE) 5000 UNIT/ML IJ SOLN
5000.0000 [IU] | Freq: Three times a day (TID) | INTRAMUSCULAR | Status: DC
Start: 2016-06-03 — End: 2016-06-06
  Administered 2016-06-03 – 2016-06-06 (×8): 5000 [IU] via SUBCUTANEOUS
  Filled 2016-06-03 (×8): qty 1

## 2016-06-03 MED ORDER — SODIUM CHLORIDE 0.9 % IV BOLUS (SEPSIS)
1000.0000 mL | Freq: Once | INTRAVENOUS | Status: AC
  Administered 2016-06-03: 1000 mL via INTRAVENOUS

## 2016-06-03 MED ORDER — SENNA 8.6 MG PO TABS
1.0000 | ORAL_TABLET | Freq: Every day | ORAL | Status: DC
  Administered 2016-06-04 – 2016-06-06 (×3): 8.6 mg via ORAL
  Filled 2016-06-03 (×3): qty 1

## 2016-06-03 MED ORDER — PIPERACILLIN-TAZOBACTAM 3.375 G IVPB
3.3750 g | Freq: Three times a day (TID) | INTRAVENOUS | Status: AC
  Administered 2016-06-03 – 2016-06-05 (×6): 3.375 g via INTRAVENOUS
  Filled 2016-06-03 (×6): qty 50

## 2016-06-03 MED ORDER — SODIUM CHLORIDE 0.9 % IV SOLN: 250.0000 mL | INTRAVENOUS | Status: DC | PRN

## 2016-06-03 MED ORDER — LEVOTHYROXINE SODIUM 100 MCG PO TABS
100.0000 ug | ORAL_TABLET | Freq: Every day | ORAL | Status: DC
  Administered 2016-06-04 – 2016-06-06 (×3): 100 ug via ORAL
  Filled 2016-06-03 (×3): qty 1

## 2016-06-03 MED ORDER — SODIUM CHLORIDE 0.9 % IV SOLN
INTRAVENOUS | Status: DC
  Administered 2016-06-06: 250 mL via INTRAVENOUS

## 2016-06-03 MED ORDER — CLOZAPINE 100 MG PO TABS
200.0000 mg | ORAL_TABLET | Freq: Every day | ORAL | Status: DC
  Administered 2016-06-04 – 2016-06-05 (×2): 200 mg via ORAL
  Filled 2016-06-03 (×2): qty 2

## 2016-06-03 MED ORDER — VANCOMYCIN HCL 10 G IV SOLR
1500.0000 mg | INTRAVENOUS | Status: DC
  Administered 2016-06-04 – 2016-06-05 (×2): 1500 mg via INTRAVENOUS
  Filled 2016-06-03: qty 2500
  Filled 2016-06-03: qty 1500

## 2016-06-03 MED ORDER — VANCOMYCIN HCL 10 G IV SOLR
2000.0000 mg | Freq: Once | INTRAVENOUS | Status: AC
  Administered 2016-06-03: 2000 mg via INTRAVENOUS
  Filled 2016-06-03: qty 2000

## 2016-06-03 MED ORDER — PIPERACILLIN-TAZOBACTAM 3.375 G IVPB 30 MIN
3.3750 g | Freq: Once | INTRAVENOUS | Status: AC
  Administered 2016-06-03: 3.375 g via INTRAVENOUS
  Filled 2016-06-03: qty 50

## 2016-06-03 MED ORDER — POLYETHYLENE GLYCOL 3350 17 G PO PACK
17.0000 g | PACK | Freq: Every day | ORAL | Status: DC
  Administered 2016-06-04 – 2016-06-06 (×3): 17 g via ORAL
  Filled 2016-06-03 (×3): qty 1

## 2016-06-03 MED ORDER — NOREPINEPHRINE BITARTRATE 1 MG/ML IV SOLN
2.0000 ug/min | INTRAVENOUS | Status: DC
  Administered 2016-06-03: 10 ug/min via INTRAVENOUS
  Filled 2016-06-03: qty 4

## 2016-06-03 MED ORDER — CLOZAPINE 25 MG PO TABS
50.0000 mg | ORAL_TABLET | Freq: Two times a day (BID) | ORAL | Status: DC
  Administered 2016-06-04 – 2016-06-06 (×5): 50 mg via ORAL
  Filled 2016-06-03 (×5): qty 2

## 2016-06-03 MED ORDER — INSULIN ASPART 100 UNIT/ML ~~LOC~~ SOLN: 0.0000 [IU] | Freq: Every day | SUBCUTANEOUS | Status: DC

## 2016-06-03 MED ORDER — ESCITALOPRAM OXALATE 10 MG PO TABS
10.0000 mg | ORAL_TABLET | Freq: Every day | ORAL | Status: DC
  Administered 2016-06-04 – 2016-06-06 (×3): 10 mg via ORAL
  Filled 2016-06-03 (×3): qty 1

## 2016-06-03 MED ORDER — DOCUSATE SODIUM 100 MG PO CAPS
100.0000 mg | ORAL_CAPSULE | Freq: Every day | ORAL | Status: DC
  Administered 2016-06-03 – 2016-06-05 (×3): 100 mg via ORAL
  Filled 2016-06-03 (×3): qty 1

## 2016-06-03 MED ORDER — CHLORPROMAZINE HCL 100 MG PO TABS
100.0000 mg | ORAL_TABLET | Freq: Every evening | ORAL | Status: DC
  Administered 2016-06-04: 100 mg via ORAL
  Filled 2016-06-03 (×3): qty 1

## 2016-06-03 MED ORDER — CHLORPROMAZINE HCL 50 MG PO TABS
50.0000 mg | ORAL_TABLET | Freq: Two times a day (BID) | ORAL | Status: DC
  Administered 2016-06-04 – 2016-06-06 (×5): 50 mg via ORAL
  Filled 2016-06-03 (×6): qty 1

## 2016-06-03 MED ORDER — INSULIN ASPART 100 UNIT/ML ~~LOC~~ SOLN
0.0000 [IU] | Freq: Three times a day (TID) | SUBCUTANEOUS | Status: DC
  Administered 2016-06-03 – 2016-06-04 (×2): 2 [IU] via SUBCUTANEOUS
  Administered 2016-06-04: 5 [IU] via SUBCUTANEOUS
  Administered 2016-06-04: 3 [IU] via SUBCUTANEOUS
  Administered 2016-06-05: 2 [IU] via SUBCUTANEOUS
  Administered 2016-06-05: 3 [IU] via SUBCUTANEOUS
  Administered 2016-06-06: 2 [IU] via SUBCUTANEOUS
  Administered 2016-06-06: 3 [IU] via SUBCUTANEOUS

## 2016-06-03 MED ORDER — SODIUM CHLORIDE 0.9 % IV BOLUS (SEPSIS)
2000.0000 mL | Freq: Once | INTRAVENOUS | Status: AC
  Administered 2016-06-03: 2000 mL via INTRAVENOUS

## 2016-06-03 NOTE — ED Provider Notes (Signed)
CSN: [id number]     Arrival date & time 06/03/16  1004 History   First MD Initiated Contact with Patient 06/03/16 1014     Chief Complaint  Patient presents with  . Excessive Sweating     (Consider location/radiation/quality/duration/timing/severity/associated sxs/prior Treatment) Patient is a 65 y.o. male presenting with general illness. The history is provided by the patient.  Illness Severity:  Moderate Onset quality:  Gradual Duration:  2 hours Timing:  Constant Progression:  Worsening Chronicity:  New Associated symptoms: fatigue and fever   Associated symptoms: no abdominal pain, no chest pain, no congestion, no diarrhea, no headaches, no myalgias, no rash, no shortness of breath and no vomiting    65 yo M With a chief complaints of excessive sweating and weakness. This been going on for the past couple days. Last time patient had the symptoms he was diagnosed with pneumonia. He denies any cough or congestion. Patient does have some right lower extremity swelling with redness. He is unsure how long it's been there.  Past Medical History  Diagnosis Date  . Chronic kidney disease 09/20/2008  . Paranoid schizophrenia ([place])   . Hiatal hernia   . NHL (non-Hodgkin's lymphoma) ([place]) dx'd 08/2008 rt groin    chemo comp 08/2008  . CLL (chronic lymphoblastic leukemia) 10/2003  . cll dx'd 10/2003    no rx  . Non Hodgkin's lymphoma ([place]) 08/2008  . GERD (gastroesophageal reflux disease)   . Hypertension   . Schizoaffective disorder ([place])   . Vitamin D deficiency   . Chronic lymphocytic leukemia ([place])   . Non Hodgkin's lymphoma ([place])   . Hyperlipemia   . Lower extremity edema   . Sepsis ([place])   . Cellulitis   . Prolonged Q-T interval on ECG    History reviewed. No pertinent past surgical history. Family History  Problem Relation Age of Onset  . Alcohol abuse Father    Social History  Substance Use Topics  . Smoking status: Never Smoker   . Smokeless tobacco: None  .  Alcohol Use: No    Review of Systems  Constitutional: Positive for fever, chills and fatigue.  HENT: Negative for congestion and facial swelling.   Eyes: Negative for discharge and visual disturbance.  Respiratory: Negative for shortness of breath.   Cardiovascular: Negative for chest pain and palpitations.  Gastrointestinal: Negative for vomiting, abdominal pain and diarrhea.  Musculoskeletal: Negative for myalgias and arthralgias.  Skin: Negative for color change and rash.  Neurological: Negative for tremors, syncope and headaches.  Psychiatric/Behavioral: Negative for confusion and dysphoric mood.      Allergies  Review of patient's allergies indicates no known allergies.  Home Medications   Prior to Admission medications   Medication Sig Start Date End Date Taking? Authorizing Provider  aspirin EC 81 MG tablet Take 81 mg by mouth daily.   Yes Historical Provider, MD  chlorproMAZINE (THORAZINE) 100 MG tablet Take 100 mg by mouth every evening.   Yes Historical Provider, MD  chlorproMAZINE (THORAZINE) 50 MG tablet Take 50 mg by mouth 2 (two) times daily.   Yes Historical Provider, MD  Cholecalciferol (VITAMIN D-3 PO) Take 5,000 mg by mouth daily.   Yes Historical Provider, MD  clotrimazole (LOTRIMIN) 1 % cream Apply 1 application topically 2 (two) times daily.   Yes Historical Provider, MD  cloZAPine (CLOZARIL) 100 MG tablet Take 200 mg by mouth at bedtime.    Yes Historical Provider, MD  clozapine (CLOZARIL) 50 MG tablet Take 50 mg by  mouth 2 (two) times daily.   Yes Historical Provider, MD  docusate sodium (COLACE) 100 MG capsule Take 100 mg by mouth daily.   Yes Historical Provider, MD  escitalopram (LEXAPRO) 10 MG tablet Take 10 mg by mouth daily before breakfast.    Yes Historical Provider, MD  ezetimibe (ZETIA) 10 MG tablet Take 10 mg by mouth daily.   Yes Historical Provider, MD  lamoTRIgine (LAMICTAL) 25 MG tablet Take 50 mg by mouth 2 (two) times daily.    Yes Historical  Provider, MD  levothyroxine (SYNTHROID, LEVOTHROID) 100 MCG tablet Take 100 mcg by mouth daily before breakfast.    Yes Historical Provider, MD  mineral oil liquid Place into the right ear once a week. Instill 1-2 drops into the right ear once weekly.   Yes Historical Provider, MD  Omega-3 Fatty Acids (FISH OIL) 1000 MG CAPS Take 4,000 mg by mouth daily.   Yes Historical Provider, MD  polyethylene glycol (MIRALAX / GLYCOLAX) packet Take 17 g by mouth daily.   Yes Historical Provider, MD  senna (SENOKOT) 8.6 MG tablet Take 1 tablet by mouth daily.   Yes Historical Provider, MD  cephALEXin (KEFLEX) 500 MG capsule Take 1 capsule (500 mg total) by mouth 3 (three) times daily. Patient not taking: Reported on 06/03/2016 02/17/16   Verlee Monte, MD  doxycycline (VIBRA-TABS) 100 MG tablet Take 1 tablet (100 mg total) by mouth 2 (two) times daily. Patient not taking: Reported on 06/03/2016 02/17/16   Verlee Monte, MD   BP 69/32 mmHg  Pulse 99  Temp(Src) 99.2 F (37.3 C) (Rectal)  Resp 21  Ht 6' (1.829 m)  Wt 280 lb (127.007 kg)  BMI 37.97 kg/m2  SpO2 100% Physical Exam  Constitutional: He is oriented to person, place, and time. He appears well-developed and well-nourished.  HENT:  Head: Normocephalic and atraumatic.  Eyes: EOM are normal. Pupils are equal, round, and reactive to light.  Neck: Normal range of motion. Neck supple. No JVD present.  Cardiovascular: Normal rate and regular rhythm.  Exam reveals no gallop and no friction rub.   No murmur heard. Pulmonary/Chest: No respiratory distress. He has no wheezes.  Abdominal: He exhibits no distension. There is no rebound and no guarding.  Musculoskeletal: Normal range of motion.  Neurological: He is alert and oriented to person, place, and time.  Skin: No rash noted. No pallor.  Psychiatric: He has a normal mood and affect. His behavior is normal.  Nursing note and vitals reviewed.   ED Course  Procedures (including critical care time) Labs  Review Labs Reviewed  COMPREHENSIVE METABOLIC PANEL - Abnormal; Notable for the following:    Glucose, Bld 185 (*)    BUN 27 (*)    Creatinine, Ser 2.52 (*)    Total Protein 6.2 (*)    GFR calc non Af Amer 26 (*)    GFR calc Af Amer 30 (*)    All other components within normal limits  CBC WITH DIFFERENTIAL/PLATELET - Abnormal; Notable for the following:    WBC 23.9 (*)    Platelets 141 (*)    Neutro Abs 22.3 (*)    Lymphs Abs 0.6 (*)    All other components within normal limits  I-STAT CG4 LACTIC ACID, ED - Abnormal; Notable for the following:    Lactic Acid, Venous 3.95 (*)    All other components within normal limits  CULTURE, BLOOD (ROUTINE X 2)  CULTURE, BLOOD (ROUTINE X 2)  URINE CULTURE  URINALYSIS,  ROUTINE W REFLEX MICROSCOPIC (NOT AT [facility])  Randolm Idol, ED  I-STAT CG4 LACTIC ACID, ED    Imaging Review Dg Chest 2 View  06/03/2016  CLINICAL DATA:  Fever and shortness of breath EXAM: CHEST  2 VIEW COMPARISON:  February 12, 2016 FINDINGS: There is scarring in the lateral left base, stable. There is no apparent edema or consolidation. Heart is upper normal in size with pulmonary vascularity within normal limits. No adenopathy. No bone lesions. IMPRESSION: Scarring lateral left base.  No edema or consolidation. Electronically Signed   By: Lowella Grip III M.D.   On: 06/03/2016 11:24   I have personally reviewed and evaluated these images and lab results as part of my medical decision-making.   EKG Interpretation   Date/Time:  Wednesday June 03 2016 10:24:25 EDT Ventricular Rate:  113 PR Interval:    QRS Duration: 164 QT Interval:  472 QTC Calculation: 648 R Axis:   42 Text Interpretation:  Sinus tachycardia Right bundle branch block No  significant change since last tracing Confirmed by Liat Mayol MD, DANIEL  [phone]) on 06/03/2016 10:40:27 AM        EMERGENCY DEPARTMENT [place] CARDIAC EXAM "Study: Limited Ultrasound of the heart and  pericardium"  INDICATIONS:Hypotension Multiple views of the heart and pericardium are obtained with a multi-frequency probe.  PERFORMED [id number]  IMAGES ARCHIVED?: Yes  FINDINGS: No pericardial effusion, Normal contractility, IVC dilated and Tamponade physiology absent  LIMITATIONS:  Body habitus and Emergent procedure  VIEWS USED: Subcostal 4 chamber and Inferior Vena Cava  INTERPRETATION: Cardiac activity present, Pericardial effusioin absent, Cardiac tamponade absent, Probable elevated CVP and Normal contractility  COMMENT:  Done in conjunction with likely septic shock after 2 L of fluid.  No noted respiratory variation of IVC.  Start pressors   MDM   Final diagnoses:  Septic shock [facility])    65 yo M with a chief complaint of excessive sweating. The patient likely has pneumonia with left lower lobe ronchi and hypoxia.  Patient with initial soft blood pressures had mild worsening on the ED. Code sepsis was initiated. Given 30 mL/kg of IV fluid. Vanc and Zosyn.  Continues to have soft BP while in the ED.  Bedside US with likely elevated CVP, start low dose levo.  Crit care consulted. ICU admit.   CRITICAL CARE Performed by: Cecilio Asper   Total critical care time: 76 minutes  Critical care time was exclusive of separately billable procedures and treating other patients.  Critical care was necessary to treat or prevent imminent or life-threatening deterioration.  Critical care was time spent personally by me on the following activities: development of treatment plan with patient and/or surrogate as well as nursing, discussions with consultants, evaluation of patient's response to treatment, examination of patient, obtaining history from patient or surrogate, ordering and performing treatments and interventions, ordering and review of laboratory studies, ordering and review of radiographic studies, pulse oximetry and re-evaluation of patient's condition.   The  patients results and plan were reviewed and discussed.   Any x-rays performed were independently reviewed by myself.   Differential diagnosis were considered with the presenting HPI.  Medications  vancomycin (VANCOCIN) 2,000 mg in sodium chloride 0.9 % 500 mL IVPB (2,000 mg Intravenous New Bag/Given 06/03/16 1237)  sodium chloride 0.9 % bolus 1,000 mL (not administered)  norepinephrine (LEVOPHED) 4 mg in dextrose 5 % 250 mL (0.016 mg/mL) infusion (not administered)  sodium chloride 0.9 % bolus 1,000 mL (1,000 mLs Intravenous New Bag/Given  06/03/16 1050)  piperacillin-tazobactam (ZOSYN) IVPB 3.375 g (0 g Intravenous Stopped 06/03/16 1237)  sodium chloride 0.9 % bolus 2,000 mL (2,000 mLs Intravenous New Bag/Given 06/03/16 1151)    Filed Vitals:   06/03/16 1130 06/03/16 1200 06/03/16 1225 06/03/16 1256  BP: 80/31  73/33 69/32  Pulse: 105  104 99  Temp:      TempSrc:      Resp: 24  13 21   Height:  6' (1.829 m)    Weight:  280 lb (127.007 kg)    SpO2: 97%  99% 100%    Final diagnoses:  Septic shock ([place])    Admission/ observation were discussed with the admitting physician, patient and/or family and they are comfortable with the plan.      Deno Etienne, DO 06/03/16 1436

## 2016-06-03 NOTE — ED Notes (Signed)
We have had difficulty in obtaining second blood culture--antibiotic infusion begun.

## 2016-06-03 NOTE — ED Notes (Signed)
He is awake, alert and feeling better and is visiting with his p.o.a.  His skin is now slightly pale, warm and dry and he is breathing normally.

## 2016-06-03 NOTE — Progress Notes (Signed)
Utilization Review completed.  Nessie Nong RN CM  

## 2016-06-03 NOTE — ED Notes (Signed)
In spite of his persistent hypotension; he remains alert, oriented x 3 (all but date) and in no distress.  His p.o.a. Remains with him.

## 2016-06-03 NOTE — Progress Notes (Signed)
eLink Physician-Brief Progress Note Patient Name: [name] DOB: August 07, 1951 MRN: [id number]    Date of Service  06/03/2016  HPI/Events of Note  Multiple issues: 1. Troponin = 0.06 and 2. Lactic Acid = 3.72. Patient is already on ASA. Demand ischemia?  eICU Interventions  Will order: 1. Bolus with 0.9 NaCl 1 liter IV over 1 hour now. 2. Continue to trend Troponin.      Intervention Category Major Interventions: Acid-Base disturbance - evaluation and management  Sommer,Steven Eugene 06/03/2016, 6:06 PM

## 2016-06-03 NOTE — ED Notes (Signed)
Per care giver-arrived at group home to take patient to dentist for filling and cleaning-patient was sweating excessively and stated his doesn't feel well-patient states he is "unconscious"

## 2016-06-03 NOTE — ED Notes (Signed)
As I write this, Dr. Lamonte Sakai is examining him and pt. Remains awake, alert and in no distress.

## 2016-06-03 NOTE — H&P (Signed)
PULMONARY / CRITICAL CARE MEDICINE   Name: [name] MRN: [id number] DOB: [birth year]    ADMISSION DATE:  06/03/2016  REFERRING MD:  Dr Tyrone Nine  CHIEF COMPLAINT:  Excessive sweating, weakness  HISTORY OF PRESENT ILLNESS:   65 year old man with a history of schizophrenia, untreated CLL (2004), transformation to non-Hodgkin's lymphoma treated with chemotherapy (2009), chronic kidney disease, prior documentation of right lower lobe erythema and suspected cellulitis duration of which is unclear but which was treated in April 2017, May 2016. He has been discharged from the oncology clinic without any evidence of persistent disease, deemed a cancer survivor and not on any active therapy. He resides in a group home and was scheduled to be transported to his dentist for regular visit on 7/19. When he was assessed for transport he was noted to be sweating excessively, had subjective fever, stated that he felt unwell. He stated that he's felt this way for about 2 days. He denies any cough, sputum, chest discomfort. He does note that he's had right lower extremity erythema and warmth, duration unclear. This may be worse than after his treatment for cellulitis in April. Right lower extremity Doppler ultrasound was negative for DVT. He has been hypotensive to the 70 systolic in the emergency department with intact mental status. Also noted to have SPO2 88% on room air at presentation, responded quickly to nasal cannula O2 3 L/m. He received empiric antibiotics and IVF resuscitation 3L. A quick bedside echocardiogram showed no IVC collapse and he was started on norepinephrine. PCCM asked to evaluate  PAST MEDICAL HISTORY :  He  has a past medical history of Chronic kidney disease (09/20/2008); Paranoid schizophrenia ([place]); Hiatal hernia; NHL (non-Hodgkin's lymphoma) ([place]) (dx'd 08/2008 rt groin); CLL (chronic lymphoblastic leukemia) (10/2003); cll (dx'd 10/2003); Non Hodgkin's lymphoma ([place]) (08/2008); GERD  (gastroesophageal reflux disease); Hypertension; Schizoaffective disorder ([place]); Vitamin D deficiency; Chronic lymphocytic leukemia ([place]); Non Hodgkin's lymphoma ([place]); Hyperlipemia; Lower extremity edema; Sepsis ([place]); Cellulitis; and Prolonged Q-T interval on ECG.  PAST SURGICAL HISTORY: He  has no past surgical history on file.  No Known Allergies  No current facility-administered medications on file prior to encounter.   Current Outpatient Prescriptions on File Prior to Encounter  Medication Sig  . aspirin EC 81 MG tablet Take 81 mg by mouth daily.  . chlorproMAZINE (THORAZINE) 100 MG tablet Take 100 mg by mouth every evening.  . chlorproMAZINE (THORAZINE) 50 MG tablet Take 50 mg by mouth 2 (two) times daily.  . cloZAPine (CLOZARIL) 100 MG tablet Take 200 mg by mouth at bedtime.   . clozapine (CLOZARIL) 50 MG tablet Take 50 mg by mouth 2 (two) times daily.  Marland Kitchen docusate sodium (COLACE) 100 MG capsule Take 100 mg by mouth daily.  Marland Kitchen escitalopram (LEXAPRO) 10 MG tablet Take 10 mg by mouth daily before breakfast.   . ezetimibe (ZETIA) 10 MG tablet Take 10 mg by mouth daily.  Marland Kitchen lamoTRIgine (LAMICTAL) 25 MG tablet Take 50 mg by mouth 2 (two) times daily.   Marland Kitchen levothyroxine (SYNTHROID, LEVOTHROID) 100 MCG tablet Take 100 mcg by mouth daily before breakfast.   . mineral oil liquid Place into the right ear once a week. Instill 1-2 drops into the right ear once weekly.  . Omega-3 Fatty Acids (FISH OIL) 1000 MG CAPS Take 4,000 mg by mouth daily.  . polyethylene glycol (MIRALAX / GLYCOLAX) packet Take 17 g by mouth daily.  Marland Kitchen senna (SENOKOT) 8.6 MG tablet Take 1 tablet by mouth daily.  Marland Kitchen  cephALEXin (KEFLEX) 500 MG capsule Take 1 capsule (500 mg total) by mouth 3 (three) times daily. (Patient not taking: Reported on 06/03/2016)  . doxycycline (VIBRA-TABS) 100 MG tablet Take 1 tablet (100 mg total) by mouth 2 (two) times daily. (Patient not taking: Reported on 06/03/2016)    FAMILY HISTORY:  His  indicated that his mother is deceased. He indicated that his father is deceased. He indicated that his sister is alive.   SOCIAL HISTORY: He  reports that he has never smoked. He does not have any smokeless tobacco history on file. He reports that he does not drink alcohol or use illicit drugs.  REVIEW OF SYSTEMS:   Has had some nausea. States that he has had more freq BM's but was formed, not diarrhea, no BRBPR or melena Felt light headed this am May have had some occasional cough, no sputum  SUBJECTIVE:  Feeling a bit better w IVF  VITAL SIGNS: BP 69/32 mmHg  Pulse 99  Temp(Src) 99.2 F (37.3 C) (Rectal)  Resp 21  Ht 6' (1.829 m)  Wt 127.007 kg (280 lb)  BMI 37.97 kg/m2  SpO2 100%  HEMODYNAMICS:    VENTILATOR SETTINGS:    INTAKE / OUTPUT:    PHYSICAL EXAMINATION: General:  Obese man, interacting but poor insight into his disease, able to follow commands Neuro:  Awake, alert, oriented to self and place, moves all ext HEENT:  OP clear, PERRL Cardiovascular:  Regular, no M Lungs:  Decreased at bases, mild L basilar crackles Abdomen:  Obese, protuberant but benign, NT, + BS Musculoskeletal:  No deformities Skin: RLE has an area of pinkness from shin to ankle, blanches with palpation, no warmth or true erythema   LABS:  BMET  Recent Labs Lab 06/03/16 1050  NA 139  K 3.9  CL 106  CO2 22  BUN 27*  CREATININE 2.52*  GLUCOSE 185*    Electrolytes  Recent Labs Lab 06/03/16 1050  CALCIUM 9.4    CBC  Recent Labs Lab 06/03/16 1050  WBC 23.9*  HGB 13.1  HCT 39.9  PLT 141*    Coag's No results for input(s): APTT, INR in the last 168 hours.  Sepsis Markers  Recent Labs Lab 06/03/16 1100  LATICACIDVEN 3.95*    ABG No results for input(s): PHART, PCO2ART, PO2ART in the last 168 hours.  Liver Enzymes  Recent Labs Lab 06/03/16 1050  AST 27  ALT 21  ALKPHOS 66  BILITOT 0.8  ALBUMIN 3.9    Cardiac Enzymes No results for input(s):  TROPONINI, PROBNP in the last 168 hours.  Glucose No results for input(s): GLUCAP in the last 168 hours.  Imaging Dg Chest 2 View  06/03/2016  CLINICAL DATA:  Fever and shortness of breath EXAM: CHEST  2 VIEW COMPARISON:  February 12, 2016 FINDINGS: There is scarring in the lateral left base, stable. There is no apparent edema or consolidation. Heart is upper normal in size with pulmonary vascularity within normal limits. No adenopathy. No bone lesions. IMPRESSION: Scarring lateral left base.  No edema or consolidation. Electronically Signed   By: Lowella Grip III M.D.   On: 06/03/2016 11:24     STUDIES:  Right lower shooting Doppler ultrasound  7/19 >> negative for DVT  CULTURES: Blood culture 7/19 >> Urine culture 7/19 >>   ANTIBIOTICS: Zosyn 7/19 >> Vancomycin 7/19 >>   SIGNIFICANT EVENTS:  LINES/TUBES:  DISCUSSION: 65 yo man, hx remote CLL and Lymphoma, schizophrenia in a group home. Admitted "not  feeling well", without specific complaints but found to be in shock with acute on chronic renal failure, leukocytosis.  Etiology unclear > considering RLE cellulitis and HCAP, but exam and history not compelling for either. Will give volume, wean norepi, treat w empiric abx for now. Narrow or d/c depending on the cultures and clinical course.   ASSESSMENT / PLAN:  PULMONARY A: Hypoxemia on presentation Question at risk obstructive sleep apnea/obesity hypoventilation syndrome P:   Follow chest x-ray for evolving infiltrate. Film from 7/19 shows left basilar scarring and atelectasis, unchanged compared with 02/12/16 Etiology hypoxemia unclear, question hypoventilation + shock state > wean O2 aggressively. If unable then will pursue further pulm workup  CARDIOVASCULAR A:  Shock, presumed septic shock. Consider also cardiogenic, hypovolemic Hyperlipidemia  P:  Agree with IV fluid volume resuscitation Low dose Norepinephrine drip initiated via PIV, we will wean for SBP greater  then 90. Suspect he will wean quickly. If not then consider CVC placement vs change over to phenylephrine via PIV Serial troponins Echocardiogram to evaluate LV function, possible pulmonary hypertension Abx as below Continue outpt Zetia, ASA  RENAL A:   Acute on chronic renal failure (baseline GFR ~40), likely due to hypoperfusion/ATN Mild lactic acidosis on presentation P:   Volume resuscitation and reverse shock state Renal ultrasound Follow urine output in BMP Follow lactate for clearance  GASTROINTESTINAL A:   SUP Nutrition History constipation P:   Heart healthy diet protonix PO Senna, Colace, miralax per his home regimen  HEMATOLOGIC A:   History of CLL with transformation to non-Hodgkin's lymphoma, treated.  Acute leukocytosis DVT prophylaxis P:  Follow CBC Heparin for prophylaxis  INFECTIOUS A:   Suspected septic shock, possibly right lower extremity cellulitis but exam not compelling Doubt pneumonia based on clinical history and chest x-ray P:   Continue vancomycin for now to treat right lower extremity cellulitis given some risk for MRSA from his group home; Consider narrowing to Ancef if suspicion for MRSA decreases Empiric zosyn for now given hemodynamic instability  ENDOCRINE A:   At risk hyperglycemia in the setting of sepsis   Hypothyroidism P:   Follow CBG with SSI Levothyroxine 100 g daily  NEUROLOGIC A:   Paranoid schizophrenia Note that Thorazine and other antipsychotics can cause NMS but no history or findings to support this dx P:   RASS goal: 0 Continue Thorazine, clozapine,  Lexapro, Lamictal   FAMILY  - Updates: No family at bedside  - Inter-disciplinary family meet or Palliative Care meeting due by: 06/10/16  Independent CC time 51 minutes   Baltazar Apo, MD, PhD 06/03/2016, 2:24 PM [facility] [phone] or if no answer [phone]

## 2016-06-03 NOTE — Progress Notes (Signed)
Preliminary results by tech - Right Lower Ext. Venous Duplex Completed. Negative for deep and superficial vein thrombosis in the right leg.  Gillian Kluever, BS, RDMS, RVT  

## 2016-06-03 NOTE — Progress Notes (Signed)
REMS Clozapine program Update: Patient & MD registered; Labs entered and acceptable.  Per Clozapine REMS program, acceptable to continue medication. ANC monitoring required every 4 weeks.    Netta Cedars, PharmD, BCPS Pager: [phone] 06/03/2016@4 :21 PM

## 2016-06-03 NOTE — Progress Notes (Signed)
Pharmacy Antibiotic Note  [name] is a 65 yo man with hx remote CLL and Lymphoma, schizophrenia in a group home. Presents "not feeling well", without specific complaints but found to be in shock with acute on CKD, leukocytosis. Etiology unclear; considering RLE cellulitis and HCAP, but exam and history not compelling for either.  Pharmacy consulted to dose vancomycin and Zosyn.  Plan:  Vancomycin 2000 mg given in ED, then 1500 mg IV q24 hr; goal trough 15-20 mcg/mL  Measure vancomycin trough levels at steady state as indicated  Zosyn 3.375 g IV given once over 30 minutes, then every 8 hrs by 4-hr infusion  Height: 6' (182.9 cm) Weight: 280 lb (127.007 kg) (Estimate) IBW/kg (Calculated) : 77.6  Temp (24hrs), Avg:98.7 F (37.1 C), Min:98.2 F (36.8 C), Max:99.2 F (37.3 C)   Recent Labs Lab 06/03/16 1050 06/03/16 1100 06/03/16 1351  WBC 23.9*  --   --   CREATININE 2.52*  --   --   LATICACIDVEN  --  3.95* 3.72*    Estimated Creatinine Clearance: 41.3 mL/min (by C-G formula based on Cr of 2.52).    No Known Allergies  Antimicrobials this admission: Vancomycin 7/19 >>  Zosyn 7/19 >>   Dose adjustments this admission: ---  Microbiology results: 7/19 BCx: sent 7/19 UCx: sent   7/19 MRSA PCR: sent  Thank you for allowing pharmacy to be a part of this patient's care.  Reuel Boom, PharmD, BCPS Pager: [phone] 06/03/2016, 4:16 PM

## 2016-06-03 NOTE — ED Notes (Signed)
[name] [phone] guardian

## 2016-06-04 ENCOUNTER — Inpatient Hospital Stay (HOSPITAL_COMMUNITY): Payer: Medicare Other

## 2016-06-04 DIAGNOSIS — R579 Shock, unspecified: Secondary | ICD-10-CM

## 2016-06-04 LAB — BASIC METABOLIC PANEL
Anion gap: 6 (ref 5–15)
BUN: 22 mg/dL — ABNORMAL HIGH (ref 6–20)
CALCIUM: 8.5 mg/dL — AB (ref 8.9–10.3)
CO2: 23 mmol/L (ref 22–32)
CREATININE: 2.04 mg/dL — AB (ref 0.61–1.24)
Chloride: 113 mmol/L — ABNORMAL HIGH (ref 101–111)
GFR calc Af Amer: 38 mL/min — ABNORMAL LOW (ref >60)
GFR calc non Af Amer: 33 mL/min — ABNORMAL LOW (ref >60)
GLUCOSE: 127 mg/dL — AB (ref 65–99)
Potassium: 3.9 mmol/L (ref 3.5–5.1)
Sodium: 142 mmol/L (ref 135–145)

## 2016-06-04 LAB — MAGNESIUM: Magnesium: 1.6 mg/dL — ABNORMAL LOW (ref 1.7–2.4)

## 2016-06-04 LAB — ECHOCARDIOGRAM COMPLETE
HEIGHTINCHES: 72 in
WEIGHTICAEL: 4719.61 [oz_av]

## 2016-06-04 LAB — CBC
HEMATOCRIT: 36.8 % — AB (ref 39.0–52.0)
Hemoglobin: 12.2 g/dL — ABNORMAL LOW (ref 13.0–17.0)
MCH: 30.8 pg (ref 26.0–34.0)
MCHC: 33.2 g/dL (ref 30.0–36.0)
MCV: 92.9 fL (ref 78.0–100.0)
Platelets: 126 10*3/uL — ABNORMAL LOW (ref 150–400)
RBC: 3.96 MIL/uL — ABNORMAL LOW (ref 4.22–5.81)
RDW: 13.9 % (ref 11.5–15.5)
WBC: 17.5 10*3/uL — ABNORMAL HIGH (ref 4.0–10.5)

## 2016-06-04 LAB — GLUCOSE, CAPILLARY
GLUCOSE-CAPILLARY: 142 mg/dL — AB (ref 65–99)
Glucose-Capillary: 151 mg/dL — ABNORMAL HIGH (ref 65–99)
Glucose-Capillary: 208 mg/dL — ABNORMAL HIGH (ref 65–99)
Glucose-Capillary: 156 mg/dL — ABNORMAL HIGH (ref 65–99)

## 2016-06-04 LAB — PHOSPHORUS: Phosphorus: 3.2 mg/dL (ref 2.5–4.6)

## 2016-06-04 LAB — URINE CULTURE: Culture: NO GROWTH

## 2016-06-04 LAB — TROPONIN I
TROPONIN I: 0.03 ng/mL — AB (ref <0.03)
Troponin I: 0.04 ng/mL (ref <0.03)

## 2016-06-04 LAB — LACTIC ACID, PLASMA: Lactic Acid, Venous: 0.8 mmol/L (ref 0.5–1.9)

## 2016-06-04 NOTE — Progress Notes (Signed)
  Echocardiogram 2D Echocardiogram has been performed.  Bobbye Charleston 06/04/2016, 9:06 AM

## 2016-06-04 NOTE — Progress Notes (Signed)
If social work needs to contact [place] and she does not answer while on vacation the other social workers name is Floria Raveling and her number is [phone] Ext [phone]

## 2016-06-04 NOTE — Progress Notes (Signed)
Date:  June 04, 2016 Chart reviewed for concurrent status and case management needs. Will continue to follow the patient for changes and needs: hypotensive,bun-22,creat-2.04,wcb-17.5,02 at 3 l/min for TEE today [id number] Velva Harman, BSN, [place], [place]

## 2016-06-04 NOTE — Progress Notes (Signed)
Patients guardian called to notify of room change.

## 2016-06-04 NOTE — Progress Notes (Signed)
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 He has been discharged from the oncology clinic without any evidence of persistent disease, deemed a cancer survivor and not on any active therapy. He resides in a group home and was scheduled to be transported to his dentist for regular visit on 7/19. When he was assessed for transport he was noted to be sweating excessively, had subjective fever, stated that he felt unwell. He stated that he's felt this way for about 2 days. He denies any cough, sputum, chest discomfort. He does note that he's had right lower extremity erythema and warmth, duration unclear. This may be worse than after his treatment for cellulitis in April. Right lower extremity Doppler ultrasound was negative for DVT. He has been hypotensive to the 70 systolic in the emergency department with intact mental status. Also noted to have SPO2 88% on room air at presentation, responded quickly to nasal cannula O2 3 L/m. He received empiric antibiotics and IVF resuscitation 3L. A quick bedside echocardiogram showed no IVC collapse and he was started on norepinephrine. PCCM asked to evaluate  SUBJECTIVE:  Alert and feeling better Tolerating PO Pressors weaned to off  VITAL SIGNS: BP 116/49 mmHg  Pulse 94  Temp(Src) 99.3 F (37.4 C) (Oral)  Resp 21  Ht 6' (1.829 m)  Wt 133.8 kg (294 lb 15.6 oz)  BMI 40.00 kg/m2  SpO2 94%  HEMODYNAMICS:    VENTILATOR SETTINGS:    INTAKE / OUTPUT: I/O  last 3 completed shifts: In: 75 [I.V.:4773; IV Piggyback:1100] Out: 2525 [Urine:2525]  PHYSICAL EXAMINATION: General:  Obese man, interacting but poor insight into his disease, able to follow commands Neuro:  Awake, alert, oriented to self and place, moves all ext HEENT:  OP clear, PERRL Cardiovascular:  Regular, no M Lungs:  Decreased at bases, mild L basilar crackles Abdomen:  Obese, protuberant but benign, NT, + BS Musculoskeletal:  No deformities Skin: RLE has an area of pinkness from shin to ankle, blanches with palpation, no warmth or true erythema   LABS:  BMET  Recent Labs Lab 06/03/16 1050 06/04/16 0342  NA 139 142  K 3.9 3.9  CL 106 113*  CO2 22 23  BUN 27* 22*  CREATININE 2.52* 2.04*  GLUCOSE 185* 127*    Electrolytes  Recent Labs Lab 06/03/16 1050 06/04/16 0342  CALCIUM 9.4 8.5*  MG  --  1.6*  PHOS  --  3.2    CBC  Recent Labs Lab 06/03/16 1050 06/04/16 0342  WBC 23.9* 17.5*  HGB 13.1 12.2*  HCT 39.9 36.8*  PLT 141* 126*    Coag's No results for input(s): APTT, INR in the last 168 hours.  Sepsis Markers  Recent Labs Lab 06/03/16 1351 06/03/16 1720 06/04/16 0342  LATICACIDVEN 3.72* 2.8* 0.8    ABG No results for input(s): PHART, PCO2ART, PO2ART in the last 168 hours.  Liver Enzymes  Recent Labs Lab 06/03/16 1050  AST 27  ALT 21  ALKPHOS 66  BILITOT 0.8  ALBUMIN 3.9    Cardiac  Enzymes  Recent Labs Lab 06/03/16 1712 06/03/16 2302 06/04/16 0342  TROPONINI 0.06* 0.04* 0.03*    Glucose  Recent Labs Lab 06/03/16 1711 06/03/16 2119 06/04/16 0722  GLUCAP 125* 112* 151*    Imaging Dg Chest 2 View  06/03/2016  CLINICAL DATA:  Fever and shortness of breath EXAM: CHEST  2 VIEW COMPARISON:  February 12, 2016 FINDINGS: There is scarring in the lateral left base, stable. There is no apparent edema or consolidation. Heart is upper normal in size with pulmonary vascularity within normal limits. No adenopathy. No bone  lesions. IMPRESSION: Scarring lateral left base.  No edema or consolidation. Electronically Signed   By: Lowella Grip III M.D.   On: 06/03/2016 11:24   Dg Chest Port 1 View  06/04/2016  CLINICAL DATA:  Hypoxia. EXAM: PORTABLE CHEST 1 VIEW COMPARISON:  06/03/2016.  02/12/2016.  09/06/ 2016. FINDINGS: Mediastinal fullness is noted. This may be related to AP portable technique. A PA and lateral chest x-ray suggested. Mediastinal fullness remains IV contrast-enhanced chest CT suggested for further evaluation. Low lung volumes. Mild left lower lobe infiltrate. Small left pleural effusion versus pleural parenchymal scarring. Stable mild cardiomegaly. No pulmonary venous congestion. No pneumothorax. No acute bony abnormality. IMPRESSION: 1. Mediastinal fullness. This may be related AP portable technique. PA and lateral chest x-ray suggested. If mediastinal fullness remains IV contrast-enhanced CT of the chest is suggested for further evaluation to exclude a mediastinal mass or thoracic aortic aneurysm. 2. Low lung volumes. Mild left lower lobe infiltrate. Small left pleural effusion versus pleural parenchymal scarring. 3. Stable mild cardiomegaly. Electronically Signed   By: Marcello Moores  Register   On: 06/04/2016 06:40     STUDIES:  Right lower shooting Doppler ultrasound  7/19 >> negative for DVT TTE 7/20 >>   CULTURES: Blood culture 7/19 >> Urine culture 7/19 >>   ANTIBIOTICS: Zosyn 7/19 >> Vancomycin 7/19 >>   SIGNIFICANT EVENTS:  LINES/TUBES:  DISCUSSION: 65 yo man, hx remote CLL and Lymphoma, schizophrenia in a group home. Admitted "not feeling well", without specific complaints but found to be in shock with acute on chronic renal failure, leukocytosis.  Etiology unclear > considered RLE cellulitis and HCAP, but exam and history not compelling for either. Responded to volume, empiric abx for now. Norepi quickly weaned to off. Consider cardiac cause? Note mild positive troponin of unclear  importance. Echocardiogram pending and should be helpful. Narrow or d/c abx depending on the cultures and clinical course.   ASSESSMENT / PLAN:  PULMONARY A: Hypoxemia on presentation, resolved Question at risk obstructive sleep apnea/obesity hypoventilation syndrome P:   CXR 7/19 and 20 stable compared with priors, no clinical PNA Etiology initial hypoxemia unclear, question hypoventilation + shock state. Resolved quickly. ? Whether he merits an outpt eval for OSA  CARDIOVASCULAR A:  Shock, presumed septic shock. Consider also cardiogenic, hypovolemic. Resolved with IVF's Hyperlipidemia  Very mild trop positive (in setting renal failure), significance unclear P:  KVO IVF on 7/20 Norepi weaned to off  Echocardiogram being performed now, results pending Abx as below, although low threshold to narrow, especially if we believe presentation more consistent w cardiac cause to hypotension Continue outpt Zetia, ASA  RENAL A:   Acute on chronic renal failure (baseline GFR ~40), likely due to hypoperfusion/ATN Mild lactic acidosis on presentation, clearing slowly P:   Renal ultrasound Follow urine output in BMP  GASTROINTESTINAL A:   SUP Nutrition History constipation P:   Heart healthy diet protonix PO Senna, Colace, miralax  per his home regimen  HEMATOLOGIC A:   History of CLL with transformation to non-Hodgkin's lymphoma, treated.  Acute leukocytosis DVT prophylaxis P:  Follow CBC Heparin for prophylaxis Note some dilutional decrease in WBC, Plt on 7/20 am  INFECTIOUS A:   Suspected septic shock, possibly right lower extremity cellulitis but exam not compelling Doubt pneumonia based on clinical history and chest x-ray P:   Continue vancomycin for now to treat right lower extremity cellulitis given some risk for MRSA from his group home Empiric zosyn, at least until urine cx results available.  May be able to d/c abx altogether 7/21 if suspicion for sepsis  decreases significantly  ENDOCRINE A:   At risk hyperglycemia in the setting of acute illness Hypothyroidism P:   Follow CBG with SSI Levothyroxine 100 g daily  NEUROLOGIC A:   Paranoid schizophrenia Note that Thorazine and other antipsychotics can cause NMS but no history or findings to support this dx P:   RASS goal: 0 Continue Thorazine, clozapine,  Lexapro, Lamictal   FAMILY  - Updates: No family at bedside  - Inter-disciplinary family meet or Palliative Care meeting due by: 06/10/16  Will change to floor status and ask TRH to assume his care on 7/21   Baltazar Apo, MD, PhD 06/04/2016, 9:08 AM  Pulmonary and Critical Care [phone] or if no answer [phone]

## 2016-06-05 DIAGNOSIS — E039 Hypothyroidism, unspecified: Secondary | ICD-10-CM

## 2016-06-05 DIAGNOSIS — J189 Pneumonia, unspecified organism: Secondary | ICD-10-CM

## 2016-06-05 DIAGNOSIS — D72829 Elevated white blood cell count, unspecified: Secondary | ICD-10-CM

## 2016-06-05 LAB — GLUCOSE, CAPILLARY
Glucose-Capillary: 162 mg/dL — ABNORMAL HIGH (ref 65–99)
Glucose-Capillary: 144 mg/dL — ABNORMAL HIGH (ref 65–99)
Glucose-Capillary: 156 mg/dL — ABNORMAL HIGH (ref 65–99)

## 2016-06-05 LAB — CBC
HCT: 39.9 % (ref 39.0–52.0)
Hemoglobin: 12.5 g/dL — ABNORMAL LOW (ref 13.0–17.0)
MCH: 30.1 pg (ref 26.0–34.0)
MCHC: 31.3 g/dL (ref 30.0–36.0)
MCV: 96.1 fL (ref 78.0–100.0)
Platelets: 122 K/uL — ABNORMAL LOW (ref 150–400)
RBC: 4.15 MIL/uL — ABNORMAL LOW (ref 4.22–5.81)
RDW: 13.9 % (ref 11.5–15.5)
WBC: 8.4 K/uL (ref 4.0–10.5)

## 2016-06-05 LAB — BASIC METABOLIC PANEL
ANION GAP: 6 (ref 5–15)
BUN: 16 mg/dL (ref 6–20)
CHLORIDE: 111 mmol/L (ref 101–111)
CO2: 24 mmol/L (ref 22–32)
Calcium: 8.9 mg/dL (ref 8.9–10.3)
Creatinine, Ser: 1.78 mg/dL — ABNORMAL HIGH (ref 0.61–1.24)
GFR calc non Af Amer: 39 mL/min — ABNORMAL LOW (ref >60)
GFR, EST AFRICAN AMERICAN: 45 mL/min — AB (ref >60)
Glucose, Bld: 188 mg/dL — ABNORMAL HIGH (ref 65–99)
POTASSIUM: 4.4 mmol/L (ref 3.5–5.1)
SODIUM: 141 mmol/L (ref 135–145)

## 2016-06-05 MED ORDER — ACETAMINOPHEN 325 MG PO TABS
650.0000 mg | ORAL_TABLET | Freq: Once | ORAL | Status: AC
  Administered 2016-06-05: 650 mg via ORAL
  Filled 2016-06-05: qty 2

## 2016-06-05 MED ORDER — POLYVINYL ALCOHOL 1.4 % OP SOLN
1.0000 [drp] | OPHTHALMIC | Status: DC | PRN
  Filled 2016-06-05: qty 15

## 2016-06-05 MED ORDER — DOXYCYCLINE HYCLATE 100 MG PO TABS
100.0000 mg | ORAL_TABLET | Freq: Two times a day (BID) | ORAL | Status: DC
  Administered 2016-06-06: 100 mg via ORAL
  Filled 2016-06-05: qty 1

## 2016-06-05 MED ORDER — DOXYCYCLINE HYCLATE 100 MG PO TABS: 100.0000 mg | ORAL_TABLET | Freq: Two times a day (BID) | ORAL | Status: DC

## 2016-06-05 NOTE — Progress Notes (Signed)
Pt with phleblitis/iv infiltration at right arm with no pain, just skin tautness. After d/cing the iv in the right arm, the tautness began to improve. Pt advised to keep arm elevated while in bed. MD informed

## 2016-06-05 NOTE — Clinical Social Work Note (Signed)
Clinical Social Work Assessment  Patient Details  Name: [name] MRN: [id number] Date of Birth: 03-26-1951  Date of referral:  06/05/16               Reason for consult:  Discharge Planning, Facility Placement                Permission sought to share information with:  Facility Art therapist granted to share information::  Yes, Verbal Permission Granted  Name::        Agency::     Relationship::     Contact Information:     Housing/Transportation Living arrangements for the past 2 months:  Group Home [facility] ) Source of Information:  Guardian [name] [phone]) Patient Interpreter Needed:  None Criminal Activity/Legal Involvement Pertinent to Current Situation/Hospitalization:    Significant Relationships:   (Guardian) Lives with:  Other (Comment) (Group Home) Do you feel safe going back to the place where you live?  No Need for family participation in patient care:  Yes (Comment)  Care giving concerns:  CSW received consult to speak with patients caregiver regarding discharge plans.    Social Worker assessment / plan:  CSW spoke with patients guardian, [name], about patients current living situation and her hopes for him once discharged. Patient is currently living at [facility] and has had a difficult time. Patients guardian and patient states that they do not enjoy living there and do not get the best care. Patients guardian wanted information about long-term care and how the process to receiving long-term care. CSW informed guardian that medicaid would need to be in place for long-term care. Patient has not yet been seen by PT which may qualify him for SNF and help in the transition to long-term care. CSW will continue to help with discharge needs.   Employment status:  Disabled (Comment on whether or not currently receiving Disability) Insurance information:  Medicare PT Recommendations:  Not assessed at this  time Information / Referral to community resources:     Patient/Family's Response to care: Patients guardian was appreciative of CSW.   Patient/Family's Understanding of and Emotional Response to Diagnosis, Current Treatment, and Prognosis:  Patients guardian was aware of patients prognosis and very active in his care. Patients guardian understood the process to receive medicaid and had already made plans to apply.   Emotional Assessment Appearance:    Attitude/Demeanor/Rapport:  Unable to Assess Affect (typically observed):  Unable to Assess Orientation:    Alcohol / Substance use:    Psych involvement (Current and /or in the community):  No (Comment)  Discharge Needs  Concerns to be addressed:  Discharge Planning Concerns Readmission within the last 30 days:  No Current discharge risk:  None Barriers to Discharge:  No Barriers Identified   Weston Anna, LCSW 06/05/2016, 11:12 AM

## 2016-06-05 NOTE — Progress Notes (Signed)
Pharmacy Antibiotic Note  [name] is a 65 yo man with hx remote CLL and Lymphoma, schizophrenia in a group home. Presents "not feeling well", without specific complaints but found to be in shock with acute on CKD, leukocytosis. Etiology unclear; considering RLE cellulitis and HCAP, but exam and history not compelling for either.  Pharmacy consulted to dose vancomycin and Zosyn.  7/21  -SCr improving 2.52-->2.04, CrCl ~52 ml/min (N38) -WBC elevated but trending down  Plan: D3 Abx  Vancomycin 2000 mg x1 in ED, then 1500 mg IV q24 hr; goal trough 15-20 mcg/mL  Measure vancomycin trough levels at steady state as indicated  Zosyn 3.375 g IV given once over 30 minutes, then every 8 hrs by 4-hr infusion  F/u renal function, cultures, clinical course  Height: 6' (182.9 cm) Weight: 291 lb 0.1 oz (132 kg) IBW/kg (Calculated) : 77.6  Temp (24hrs), Avg:98.2 F (36.8 C), Min:98 F (36.7 C), Max:98.4 F (36.9 C)   Recent Labs Lab 06/03/16 1050 06/03/16 1100 06/03/16 1351 06/03/16 1720 06/04/16 0342  WBC 23.9*  --   --   --  17.5*  CREATININE 2.52*  --   --   --  2.04*  LATICACIDVEN  --  3.95* 3.72* 2.8* 0.8    Estimated Creatinine Clearance: 52.1 mL/min (by C-G formula based on Cr of 2.04).    No Known Allergies  Antimicrobials this admission: Vancomycin 7/19 >>  Zosyn 7/19 >>   Dose adjustments this admission: ---  Microbiology results: 7/19 BCx: NGTD 7/19 UCx: NGF 7/19 MRSA PCR: neg  Thank you for allowing pharmacy to be a part of this patient's care.  Ralene Bathe, PharmD, BCPS 06/05/2016, 10:05 AM  Pager: [phone]

## 2016-06-05 NOTE — Evaluation (Signed)
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 Right lower extremity Doppler ultrasound was negative for DVT. He has been hypotensive to the 70 systolic in the emergency department with intact mental status. Also noted to have SPO2 88% on room air at presentation,  Clinical Impression  The patient did ambulate a short distance with RW. Did not assess without it. Patient was independent with ambulation and self care PTA at the group home. He reports that he sits down for a shower. Pt admitted with above diagnosis. Pt currently with functional limitations due to the deficits listed below (see PT Problem List).  Pt will benefit from skilled PT to increase their independence and safety with mobility to allow discharge to the venue listed below.       Follow Up Recommendations Home health PT;Supervision - Intermittent    Equipment Recommendations   (TBD)    Recommendations for Other Services       Precautions / Restrictions Precautions Precautions: Fall      Mobility  Bed Mobility Overal bed mobility: Independent                Transfers Overall transfer level: Needs assistance Equipment used: Rolling walker (2 wheeled) Transfers: Sit to/from Stand Sit to Stand: Supervision         General transfer comment: cues for hand placement  Ambulation/Gait Ambulation/Gait assistance: Min assist Ambulation Distance (Feet): 20 Feet Assistive device: Rolling walker (2 wheeled) Gait Pattern/deviations: Step-to pattern;Step-through pattern     General Gait Details: patient was not so  interested in walking and returned to the recliner. Used RW, unable to test without AD today.  Stairs            Wheelchair Mobility    Modified Rankin (Stroke Patients Only)       Balance Overall balance assessment: Needs assistance         Standing balance support: During functional activity;No upper extremity supported Standing balance-Leahy Scale: Fair                               Pertinent Vitals/Pain Pain Assessment: No/denies pain    Home Living Family/patient expects to be discharged to:: Group home                      Prior Function Level of Independence: Independent         Comments: patient walks without AD     Hand Dominance        Extremity/Trunk Assessment   Upper Extremity Assessment: Overall WFL for tasks assessed           Lower Extremity Assessment: RLE deficits/detail RLE Deficits / Details: noted reddness lower leg, demarcated and edema       Communication   Communication: No difficulties  Cognition Arousal/Alertness: Awake/alert Behavior During Therapy: WFL for tasks assessed/performed Overall Cognitive Status: Within Functional Limits for tasks assessed                      General Comments      Exercises  Assessment/Plan    PT Assessment Patient needs continued PT services  PT Diagnosis Difficulty walking;Generalized weakness   PT Problem List Decreased strength;Decreased activity tolerance;Decreased balance;Decreased mobility  PT Treatment Interventions DME instruction;Gait training;Functional mobility training;Therapeutic activities;Therapeutic exercise;Patient/family education   PT Goals (Current goals can be found in the Care Plan section) Acute Rehab PT Goals Patient Stated Goal: to go home PT Goal Formulation: With patient Time For Goal Achievement: 06/19/16 Potential to Achieve Goals: Good    Frequency Min 3X/week   Barriers to discharge         Co-evaluation               End of Session Equipment Utilized During Treatment: Gait belt Activity Tolerance: Patient tolerated treatment well Patient left: in chair;with call bell/phone within reach;with chair alarm set Nurse Communication: Mobility status         Time: [id number] PT Time Calculation (min) (ACUTE ONLY): 24 min   Charges:   PT Evaluation $PT Eval Low Complexity: 1 Procedure PT Treatments $Gait Training: 8-22 mins   PT G Codes:        Marcelino Freestone PT [id number]  06/05/2016, 11:01 AM

## 2016-06-05 NOTE — Care Management Note (Signed)
Case Management Note  Patient Details  Name: [name] MRN: [id number] Date of Birth: 10/13/1951  Subjective/Objective:      65 yo admitted with Shock              Action/Plan: Pt from ALF. PT did evaluation and are recommending HHPT. Pt has used [place] in the past and this is the preferred provider for his ALF. [facility] rep alerted of referral. No other CM needs communicated.  Expected Discharge Date:   (UNKNOWN)               Expected Discharge Plan:  [place] (FROM ALF)  In-House Referral:  Clinical Social Work  Discharge planning Services  CM Consult  Post Acute Care Choice:    Choice offered to:  [facility] POA / Guardian  DME Arranged:    DME Agency:     HH Arranged:  PT [place]:  [place] (now [facility])  Status of Service:  In process, will continue to follow  If discussed at Long Length of Stay Meetings, dates discussed:    Additional CommentsLynnell Catalan, RN 06/05/2016, 1:06 PM  [phone]

## 2016-06-05 NOTE — Progress Notes (Signed)
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 He has been discharged from the oncology clinic without any evidence of persistent disease, deemed a cancer survivor and not on any active therapy. He resides in a group home and was scheduled to be transported to his dentist for regular visit on 7/19. When he was assessed for transport he was noted to be sweating excessively, had subjective fever, stated that he felt unwell. He stated that he's felt this way for about 2 days. He denies any cough, sputum, chest discomfort. He does note that he's had right lower extremity erythema and warmth, duration unclear. This may be worse than after his treatment for cellulitis in April. Right lower extremity Doppler ultrasound was negative for DVT. He has been hypotensive to the 70 systolic in the emergency department with intact mental status. Also noted to have SPO2 88% on room air at presentation, responded quickly to nasal cannula O2 3 L/m. He received empiric antibiotics and IVF resuscitation 3L.   Assessment & Plan:    Septic Shock - resolved now.   LLL pneumonia - improved with antibiotics.    Acute Renal Failure - improving with hydration with IVF.  Renal US normal.   Lactic Acidosis - resolved now.   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   Hypothyroidism - continue levothyroxine daily.   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   DVT prophylaxis:  heparin Code Status: FULL Family Communication: none at bedside Disposition Plan: Plan return to ALF tomorrow.    Consultants:  PCCM  Antimicrobials: Anti-infectives    Start     Dose/Rate Route Frequency Ordered Stop   06/04/16 1200  vancomycin (VANCOCIN) 1,500 mg in sodium chloride 0.9 % 500 mL IVPB     1,500 mg 250 mL/hr over 120 Minutes Intravenous Every 24 hours 06/03/16 1619     06/03/16 1800  piperacillin-tazobactam (ZOSYN) IVPB 3.375 g     3.375 g 12.5 mL/hr over 240 Minutes Intravenous Every 8 hours 06/03/16 1619     06/03/16 1200  vancomycin (VANCOCIN) 2,000 mg in sodium chloride 0.9 % 500 mL IVPB     2,000 mg 250 mL/hr over 120 Minutes Intravenous  Once 06/03/16 1124 06/03/16 1532   06/03/16 1130  piperacillin-tazobactam (ZOSYN) IVPB 3.375 g     3.375 g 100 mL/hr over 30 Minutes Intravenous  Once 06/03/16 1124 06/03/16 1237          Subjective: Pt says that he is feeling better.   Objective: Filed Vitals:   06/04/16 1701 06/04/16 2139 06/05/16 0453 06/05/16 0500  BP: 138/72 124/65 111/66   Pulse: 92 98 91   Temp: 98.4 F (36.9 C) 98.1 F (36.7 C) 98.1 F (36.7 C)   TempSrc: Oral Axillary Axillary   Resp: 20 22 20    Height: 6' (1.829 m)     Weight: 132.2 kg (291 lb 7.2 oz)   132 kg (291 lb 0.1 oz)  SpO2: 98% 96% 94%     Intake/Output Summary (Last 24 hours) at 06/05/16 1130  Last data filed at 06/05/16 0700  Gross per 24 hour  Intake 1240.67 ml  Output   2025 ml  Net -784.33 ml   Filed Weights   06/04/16 0107 06/04/16 1701 06/05/16 0500  Weight: 133.8 kg (294 lb 15.6 oz) 132.2 kg (291 lb 7.2 oz) 132 kg (291 lb 0.1 oz)    Exam:  General: Obese man, interacting but poor insight into his disease, able to follow commands Neuro: Awake, alert, oriented to self and place, moves all ext HEENT: OP clear, PERRL Cardiovascular: Regular, no M Lungs: Shallow BS.  Decreased at bases, mild L basilar crackles Abdomen: Obese, protuberant but benign,  NT, + BS Musculoskeletal: No deformities Skin: RLE has an area of pinkness from shin to ankle, blanches with palpation, no warmth or erythema  Data Reviewed: Basic Metabolic Panel:  Recent Labs Lab 06/03/16 1050 06/04/16 0342  NA 139 142  K 3.9 3.9  CL 106 113*  CO2 22 23  GLUCOSE 185* 127*  BUN 27* 22*  CREATININE 2.52* 2.04*  CALCIUM 9.4 8.5*  MG  --  1.6*  PHOS  --  3.2   Liver Function Tests:  Recent Labs Lab 06/03/16 1050  AST 27  ALT 21  ALKPHOS 66  BILITOT 0.8  PROT 6.2*  ALBUMIN 3.9   No results for input(s): LIPASE, AMYLASE in the last 168 hours. No results for input(s): AMMONIA in the last 168 hours. CBC:  Recent Labs Lab 06/03/16 1050 06/04/16 0342  WBC 23.9* 17.5*  NEUTROABS 22.3*  --   HGB 13.1 12.2*  HCT 39.9 36.8*  MCV 93.9 92.9  PLT 141* 126*   Cardiac Enzymes:  Recent Labs Lab 06/03/16 1712 06/03/16 2302 06/04/16 0342  TROPONINI 0.06* 0.04* 0.03*   BNP (last 3 results) No results for input(s): PROBNP in the last 8760 hours. CBG:  Recent Labs Lab 06/03/16 2119 06/04/16 0722 06/04/16 1128 06/04/16 1735 06/04/16 2138  GLUCAP 112* 151* 142* 208* 156*    Recent Results (from the past 240 hour(s))  Blood Culture (routine x 2)     Status: None (Preliminary result)   Collection Time: 06/03/16 10:50 AM  Result Value Ref Range Status   Specimen Description BLOOD LEFT ARM  Final   Special Requests BOTTLES DRAWN AEROBIC AND ANAEROBIC 10ML  Final   Culture   Final    NO GROWTH 1 DAY Performed at [facility]    Report Status PENDING  Incomplete  Blood Culture (routine x 2)     Status: None (Preliminary result)   Collection Time: 06/03/16 12:09 PM  Result Value Ref Range Status   Specimen Description BLOOD RIGHT ANTECUBITAL  Final   Special Requests IN PEDIATRIC BOTTLE 4ML  Final   Culture   Final    NO GROWTH 1 DAY Performed at [facility]    Report Status PENDING  Incomplete  MRSA PCR Screening      Status: None   Collection Time: 06/03/16  4:00 PM  Result Value Ref Range Status   MRSA by PCR NEGATIVE NEGATIVE Final    Comment:        The GeneXpert MRSA Assay (FDA approved for NASAL specimens only), is one component of a comprehensive MRSA colonization surveillance program. It is not intended to diagnose MRSA infection nor to guide or monitor treatment for MRSA infections.   Urine culture     Status: None   Collection Time: 06/03/16  5:00 PM  Result Value Ref Range Status  Specimen Description URINE, CLEAN CATCH  Final   Special Requests NONE  Final   Culture NO GROWTH Performed at [facility]   Final   Report Status 06/04/2016 FINAL  Final     Studies: US Renal  06/04/2016  CLINICAL DATA:  Acute on chronic renal failure. EXAM: RENAL / URINARY TRACT ULTRASOUND COMPLETE COMPARISON:  12/07/2011 CT FINDINGS: Right Kidney: Length: 12.0 cm. Echogenicity within normal limits. No mass or hydronephrosis visualized. Left Kidney: Length: 13.2 cm. Echogenicity within normal limits. No mass or hydronephrosis visualized. Bladder: Appears normal for degree of bladder distention. IMPRESSION: Normal renal ultrasound. Electronically Signed   By: Nolon Nations M.D.   On: 06/04/2016 11:50   Dg Chest Port 1 View  06/04/2016  CLINICAL DATA:  Hypoxia. EXAM: PORTABLE CHEST 1 VIEW COMPARISON:  06/03/2016.  02/12/2016.  09/06/ 2016. FINDINGS: Mediastinal fullness is noted. This may be related to AP portable technique. A PA and lateral chest x-ray suggested. Mediastinal fullness remains IV contrast-enhanced chest CT suggested for further evaluation. Low lung volumes. Mild left lower lobe infiltrate. Small left pleural effusion versus pleural parenchymal scarring. Stable mild cardiomegaly. No pulmonary venous congestion. No pneumothorax. No acute bony abnormality. IMPRESSION: 1. Mediastinal fullness. This may be related AP portable technique. PA and lateral chest x-ray suggested. If mediastinal  fullness remains IV contrast-enhanced CT of the chest is suggested for further evaluation to exclude a mediastinal mass or thoracic aortic aneurysm. 2. Low lung volumes. Mild left lower lobe infiltrate. Small left pleural effusion versus pleural parenchymal scarring. 3. Stable mild cardiomegaly. Electronically Signed   By: Marcello Moores  Register   On: 06/04/2016 06:40     Scheduled Meds: . aspirin EC  81 mg Oral Daily  . chlorproMAZINE  100 mg Oral QPM  . chlorproMAZINE  50 mg Oral BID  . cloZAPine  200 mg Oral QHS  . clozapine  50 mg Oral BID  . docusate sodium  100 mg Oral QHS  . escitalopram  10 mg Oral QAC breakfast  . ezetimibe  10 mg Oral Daily  . heparin  5,000 Units Subcutaneous Q8H  . insulin aspart  0-15 Units Subcutaneous TID WC  . insulin aspart  0-5 Units Subcutaneous QHS  . lamoTRIgine  50 mg Oral BID  . levothyroxine  100 mcg Oral QAC breakfast  . pantoprazole  40 mg Oral Daily  . piperacillin-tazobactam (ZOSYN)  IV  3.375 g Intravenous Q8H  . polyethylene glycol  17 g Oral Daily  . senna  1 tablet Oral Daily  . vancomycin  1,500 mg Intravenous Q24H   Continuous Infusions: . sodium chloride 10 mL/hr at 06/04/16 [id number]    Active Problems:   Shock [facility])   Time spent:    Irwin Brakeman, MD, FAAFP [facility] Pager [phone]  If 7PM-7AM, please contact night-coverage [email or web address] Password [email or web address] 06/05/2016, 11:30 AM    LOS: 2 days

## 2016-06-06 DIAGNOSIS — L03115 Cellulitis of right lower limb: Secondary | ICD-10-CM

## 2016-06-06 LAB — GLUCOSE, CAPILLARY
GLUCOSE-CAPILLARY: 166 mg/dL — AB (ref 65–99)
Glucose-Capillary: 132 mg/dL — ABNORMAL HIGH (ref 65–99)

## 2016-06-06 MED ORDER — DOXYCYCLINE HYCLATE 100 MG PO TABS: 100.0000 mg | ORAL_TABLET | Freq: Two times a day (BID) | ORAL | Status: AC

## 2016-06-06 NOTE — Progress Notes (Signed)
Patient d/c to AL via cab. Patient able to ambulate in the room without difficulty. Denies pain. D/c paper works given to patient, verbalized understanding. Patient is stable. Alert and oriented x4.

## 2016-06-06 NOTE — Progress Notes (Signed)
Patient is set to discharge back to [facility] today. CSW confirmed with [name] at [place] that they are aebl to take patient back today. Patient & guardian, [name] aware. Discharge packet given to RN, Butch Penny. [facility] called for transport.     Raynaldo Opitz, [facility] Clinical Social Worker cell #: [phone]

## 2016-06-06 NOTE — Discharge Summary (Signed)
Physician Discharge Summary  [name] [id number] DOB: May 07, 1951 DOA: 06/03/2016  PCP: Janeth Rase, MD  Admit date: 06/03/2016 Discharge date: 06/06/2016  Admitted From: ALF Disposition:  ALF  Recommendations for Outpatient Follow-up:  1. Follow up with PCP in 1 weeks 2. Please obtain BMP/CBC in one week 3. Please follow up final culture results.  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 He has been discharged from the oncology clinic without any evidence of persistent disease, deemed a cancer survivor and not on any active therapy. He resides in a group home and was scheduled to be transported to his dentist for regular visit on 7/19. When he was assessed for transport he was noted to be sweating excessively, had subjective fever, stated that he felt unwell. He stated that he's felt this way for about 2 days. He denies any cough, sputum, chest discomfort. He does note that he's had right lower extremity erythema and warmth, duration unclear. This may be worse than after his treatment for cellulitis in April. Right lower extremity Doppler ultrasound was negative for DVT. He has been hypotensive to the 70 systolic in the emergency department with intact mental status. Also noted to have SPO2 88% on room air at presentation, responded quickly to nasal cannula O2 3 L/m. He received empiric antibiotics and IVF resuscitation 3L.   Assessment & Plan:   Septic Shock - resolved now.   LLL pneumonia - improved with antibiotics.   Cellulitis RLE - markedly improved with antibiotics, home on 5 more days of  doxycycline.  Acute Renal Failure - improving with hydration with IVF. Renal US normal.   Lactic Acidosis - resolved now.   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   Hypothyroidism - continue levothyroxine daily.   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  DVT prophylaxis: heparin Code Status: FULL Family Communication: none at bedside Disposition Plan: Plan return to ALF tomorrow.   Discharge Diagnoses:  Active Problems:   Shock [facility])    Discharge Instructions  Discharge Instructions    Increase activity slowly    Complete by:  As directed             Medication List    STOP taking these medications        cephALEXin 500 MG capsule  Commonly known as:  KEFLEX      TAKE these medications        aspirin EC 81 MG tablet  Take 81 mg by mouth daily.     chlorproMAZINE 50 MG tablet  Commonly known as:  THORAZINE  Take 50 mg by mouth 2 (two) times daily.     chlorproMAZINE 100 MG tablet  Commonly known as:  THORAZINE  Take 100 mg by mouth every evening.     clotrimazole 1 % cream  Commonly known as:  LOTRIMIN  Apply 1 application topically 2 (two) times daily.     cloZAPine 100 MG tablet  Commonly known as:  CLOZARIL  Take 200 mg by mouth at bedtime.     clozapine 50 MG tablet  Commonly known as:  CLOZARIL  Take  50 mg by mouth 2 (two) times daily.     docusate sodium 100 MG capsule  Commonly known as:  COLACE  Take 100 mg by mouth daily.     doxycycline 100 MG tablet  Commonly known as:  VIBRA-TABS  Take 1 tablet (100 mg total) by mouth 2 (two) times daily.     escitalopram 10 MG tablet  Commonly known as:  LEXAPRO  Take 10 mg by mouth daily before breakfast.     ezetimibe 10 MG tablet  Commonly known as:  ZETIA  Take 10 mg by mouth daily.     Fish Oil 1000 MG Caps  Take 4,000 mg by mouth daily.     lamoTRIgine  25 MG tablet  Commonly known as:  LAMICTAL  Take 50 mg by mouth 2 (two) times daily.     levothyroxine 100 MCG tablet  Commonly known as:  SYNTHROID, LEVOTHROID  Take 100 mcg by mouth daily before breakfast.     mineral oil liquid  Place into the right ear once a week. Instill 1-2 drops into the right ear once weekly.     polyethylene glycol packet  Commonly known as:  MIRALAX / GLYCOLAX  Take 17 g by mouth daily.     senna 8.6 MG tablet  Commonly known as:  SENOKOT  Take 1 tablet by mouth daily.     VITAMIN D-3 PO  Take 5,000 mg by mouth daily.        No Known Allergies   Procedures/Studies: Dg Chest 2 View  06/03/2016  CLINICAL DATA:  Fever and shortness of breath EXAM: CHEST  2 VIEW COMPARISON:  February 12, 2016 FINDINGS: There is scarring in the lateral left base, stable. There is no apparent edema or consolidation. Heart is upper normal in size with pulmonary vascularity within normal limits. No adenopathy. No bone lesions. IMPRESSION: Scarring lateral left base.  No edema or consolidation. Electronically Signed   By: Lowella Grip III M.D.   On: 06/03/2016 11:24   US Renal  06/04/2016  CLINICAL DATA:  Acute on chronic renal failure. EXAM: RENAL / URINARY TRACT ULTRASOUND COMPLETE COMPARISON:  12/07/2011 CT FINDINGS: Right Kidney: Length: 12.0 cm. Echogenicity within normal limits. No mass or hydronephrosis visualized. Left Kidney: Length: 13.2 cm. Echogenicity within normal limits. No mass or hydronephrosis visualized. Bladder: Appears normal for degree of bladder distention. IMPRESSION: Normal renal ultrasound. Electronically Signed   By: Nolon Nations M.D.   On: 06/04/2016 11:50   Dg Chest Port 1 View  06/04/2016  CLINICAL DATA:  Hypoxia. EXAM: PORTABLE CHEST 1 VIEW COMPARISON:  06/03/2016.  02/12/2016.  09/06/ 2016. FINDINGS: Mediastinal fullness is noted. This may be related to AP portable technique. A PA and lateral chest x-ray suggested. Mediastinal fullness  remains IV contrast-enhanced chest CT suggested for further evaluation. Low lung volumes. Mild left lower lobe infiltrate. Small left pleural effusion versus pleural parenchymal scarring. Stable mild cardiomegaly. No pulmonary venous congestion. No pneumothorax. No acute bony abnormality. IMPRESSION: 1. Mediastinal fullness. This may be related AP portable technique. PA and lateral chest x-ray suggested. If mediastinal fullness remains IV contrast-enhanced CT of the chest is suggested for further evaluation to exclude a mediastinal mass or thoracic aortic aneurysm. 2. Low lung volumes. Mild left lower lobe infiltrate. Small left pleural effusion versus pleural parenchymal scarring. 3. Stable mild cardiomegaly. Electronically Signed   By: Marcello Moores  Register   On: 06/04/2016 06:40     Subjective: Pt without complaints.   Discharge  Exam: Filed Vitals:   06/05/16 2050 06/06/16 0643  BP: 126/72 110/66  Pulse: 85 88  Temp: 98.2 F (36.8 C) 98.5 F (36.9 C)  Resp: 16 20   Filed Vitals:   06/05/16 0500 06/05/16 1519 06/05/16 2050 06/06/16 0643  BP:  126/71 126/72 110/66  Pulse:  82 85 88  Temp:  97.6 F (36.4 C) 98.2 F (36.8 C) 98.5 F (36.9 C)  TempSrc:  Oral Oral Axillary  Resp:  20 16 20   Height:      Weight: 132 kg (291 lb 0.1 oz)   132.9 kg (292 lb 15.9 oz)  SpO2:  99% 98% 93%   General: Obese man, interacting but poor insight into his disease, able to follow commands Neuro: Awake, alert, oriented to self and place, moves all ext HEENT: OP clear, PERRL Cardiovascular: Regular, no MRG Lungs: Shallow BS. Decreased at bases, but improved.   Abdomen: Obese, protuberant but benign, NT, + BS Musculoskeletal: No deformities Skin: RLE has an area of pinkness from shin to ankle, blanches with palpation, no warmth or erythema but less erythema than prior exams, less edema  The results of significant diagnostics from this hospitalization (including imaging, microbiology, ancillary  and laboratory) are listed below for reference.     Microbiology: Recent Results (from the past 240 hour(s))  Blood Culture (routine x 2)     Status: None (Preliminary result)   Collection Time: 06/03/16 10:50 AM  Result Value Ref Range Status   Specimen Description BLOOD LEFT ARM  Final   Special Requests BOTTLES DRAWN AEROBIC AND ANAEROBIC 10ML  Final   Culture   Final    NO GROWTH 2 DAYS Performed at [facility]    Report Status PENDING  Incomplete  Blood Culture (routine x 2)     Status: None (Preliminary result)   Collection Time: 06/03/16 12:09 PM  Result Value Ref Range Status   Specimen Description BLOOD RIGHT ANTECUBITAL  Final   Special Requests IN PEDIATRIC BOTTLE 4ML  Final   Culture   Final    NO GROWTH 2 DAYS Performed at [facility]    Report Status PENDING  Incomplete  MRSA PCR Screening     Status: None   Collection Time: 06/03/16  4:00 PM  Result Value Ref Range Status   MRSA by PCR NEGATIVE NEGATIVE Final    Comment:        The GeneXpert MRSA Assay (FDA approved for NASAL specimens only), is one component of a comprehensive MRSA colonization surveillance program. It is not intended to diagnose MRSA infection nor to guide or monitor treatment for MRSA infections.   Urine culture     Status: None   Collection Time: 06/03/16  5:00 PM  Result Value Ref Range Status   Specimen Description URINE, CLEAN CATCH  Final   Special Requests NONE  Final   Culture NO GROWTH Performed at [facility]   Final   Report Status 06/04/2016 FINAL  Final     Labs: BNP (last 3 results) No results for input(s): BNP in the last 8760 hours. Basic Metabolic Panel:  Recent Labs Lab 06/03/16 1050 06/04/16 0342 06/05/16 1055  NA 139 142 141  K 3.9 3.9 4.4  CL 106 113* 111  CO2 22 23 24   GLUCOSE 185* 127* 188*  BUN 27* 22* 16  CREATININE 2.52* 2.04* 1.78*  CALCIUM 9.4 8.5* 8.9  MG  --  1.6*  --   PHOS  --  3.2  --    Liver Function  Tests:  Recent Labs Lab 06/03/16 1050  AST 27  ALT 21  ALKPHOS 66  BILITOT 0.8  PROT 6.2*  ALBUMIN 3.9   No results for input(s): LIPASE, AMYLASE in the last 168 hours. No results for input(s): AMMONIA in the last 168 hours. CBC:  Recent Labs Lab 06/03/16 1050 06/04/16 0342 06/05/16 1150  WBC 23.9* 17.5* 8.4  NEUTROABS 22.3*  --   --   HGB 13.1 12.2* 12.5*  HCT 39.9 36.8* 39.9  MCV 93.9 92.9 96.1  PLT 141* 126* 122*   Cardiac Enzymes:  Recent Labs Lab 06/03/16 1712 06/03/16 2302 06/04/16 0342  TROPONINI 0.06* 0.04* 0.03*   BNP: Invalid input(s): POCBNP CBG:  Recent Labs Lab 06/04/16 2138 06/05/16 1400 06/05/16 1711 06/05/16 2101 06/06/16 0750  GLUCAP 156* 162* 144* 156* 132*   D-Dimer No results for input(s): DDIMER in the last 72 hours. Hgb A1c No results for input(s): HGBA1C in the last 72 hours. Lipid Profile No results for input(s): CHOL, HDL, LDLCALC, TRIG, CHOLHDL, LDLDIRECT in the last 72 hours. Thyroid function studies No results for input(s): TSH, T4TOTAL, T3FREE, THYROIDAB in the last 72 hours.  Invalid input(s): FREET3 Anemia work up No results for input(s): VITAMINB12, FOLATE, FERRITIN, TIBC, IRON, RETICCTPCT in the last 72 hours. Urinalysis    Component Value Date/Time   COLORURINE YELLOW 06/03/2016 1700   APPEARANCEUR CLEAR 06/03/2016 1700   LABSPEC 1.014 06/03/2016 1700   PHURINE 5.0 06/03/2016 1700   GLUCOSEU NEGATIVE 06/03/2016 1700   HGBUR NEGATIVE 06/03/2016 1700   BILIRUBINUR NEGATIVE 06/03/2016 1700   KETONESUR NEGATIVE 06/03/2016 1700   PROTEINUR NEGATIVE 06/03/2016 1700   UROBILINOGEN 0.2 05/09/2015 1740   NITRITE NEGATIVE 06/03/2016 1700   LEUKOCYTESUR NEGATIVE 06/03/2016 1700   Sepsis Labs Invalid input(s): PROCALCITONIN,  WBC,  LACTICIDVEN Microbiology Recent Results (from the past 240 hour(s))  Blood Culture (routine x 2)     Status: None (Preliminary result)   Collection Time: 06/03/16 10:50 AM  Result  Value Ref Range Status   Specimen Description BLOOD LEFT ARM  Final   Special Requests BOTTLES DRAWN AEROBIC AND ANAEROBIC 10ML  Final   Culture   Final    NO GROWTH 2 DAYS Performed at [facility]    Report Status PENDING  Incomplete  Blood Culture (routine x 2)     Status: None (Preliminary result)   Collection Time: 06/03/16 12:09 PM  Result Value Ref Range Status   Specimen Description BLOOD RIGHT ANTECUBITAL  Final   Special Requests IN PEDIATRIC BOTTLE 4ML  Final   Culture   Final    NO GROWTH 2 DAYS Performed at [facility]    Report Status PENDING  Incomplete  MRSA PCR Screening     Status: None   Collection Time: 06/03/16  4:00 PM  Result Value Ref Range Status   MRSA by PCR NEGATIVE NEGATIVE Final    Comment:        The GeneXpert MRSA Assay (FDA approved for NASAL specimens only), is one component of a comprehensive MRSA colonization surveillance program. It is not intended to diagnose MRSA infection nor to guide or monitor treatment for MRSA infections.   Urine culture     Status: None   Collection Time: 06/03/16  5:00 PM  Result Value Ref Range Status   Specimen Description URINE, CLEAN CATCH  Final   Special Requests NONE  Final   Culture NO GROWTH Performed  at [facility]   Final   Report Status 06/04/2016 FINAL  Final    Time coordinating discharge: 33 minutes  SIGNED:  Irwin Brakeman, MD  [facility] 06/06/2016, 10:34 AM Pager   If 7PM-7AM, please contact night-coverage [email or web address] Password [email or web address]

## 2016-06-06 NOTE — Discharge Instructions (Signed)
Antibiotic Medicine Antibiotic medicines are used to treat infections caused by bacteria. They work by hurting or killing the germs that are making you sick. HOW WILL MY MEDICINE BE PICKED? There are many kinds of antibiotic medicines. To help your doctor pick one, tell your doctor if:  You have any allergies.  You are pregnant or plan to get pregnant.  You are breastfeeding.  You are taking any medicines. These include over-the-counter medicines, prescription medicines, and herbal remedies.  You have a medical condition or problem. If you have questions about why your medicine was picked, ask. FOR HOW LONG SHOULD I TAKE MY MEDICINE? Take your medicine for as long as your doctor tells you to. Do not stop taking it when you feel better. If you stop taking it too soon:  You may start to feel sick again.  Your infection may get harder to treat.  New problems may develop. WHAT IF I MISS A DOSE? Try not to miss any doses of antibiotic medicine. If you miss a dose:  Take the dose as soon as you can.  If you are taking 2 doses a day, take the next dose in 5 to 6 hours.  If you are taking 3 or more doses a day, take the next dose in 2 to 4 hours. Then go back to the normal schedule. If you cannot take a missed dose, take the next dose on time. Then take the missed dose after you have taken all the doses as told by your doctor, as if you had one more dose left. DOES THIS MEDICINE AFFECT BIRTH CONTROL? Birth control pills may not work while you are on antibiotic medicines. If you are taking birth control pills, keep taking them as usual. Use a second form of birth control, such as a condom. Keep using the second form of birth control until you are finished with your current 1 month cycle of birth control pills. GET HELP IF:  You get worse.  You do not feel better a few days after starting the medicine.  You throw up (vomit).  There are white patches in your mouth.  You have new  joint pain after starting the medicine.  You have new muscle aches after starting the medicine.  You had a fever before starting the medicine, and it comes back.  You have any symptoms of an allergic reaction, such as an itchy rash. If this happens, stop taking the medicine. GET HELP RIGHT AWAY IF:  Your pee (urine) turns dark or becomes blood-colored.  Your skin turns yellow.  You bruise or bleed easily.  You have very bad watery poop (diarrhea) and cramps in your belly (abdomen).  You have a very bad headache.  You have signs of a very bad allergic reaction, such as:  Trouble breathing.  Wheezing.  Swelling of the lips, tongue, or face.  Fainting.  Blisters on the skin or in the mouth. If you have signs of a very bad allergic reaction, stop taking the antibiotic medicine right away.   This information is not intended to replace advice given to you by your health care provider. Make sure you discuss any questions you have with your health care provider.   Document Released: 08/11/2008 Document Revised: 07/24/2015 Document Reviewed: 03/20/2015 Elsevier Interactive Patient Education 2016 Elsevier Inc. Cellulitis Cellulitis is an infection of the skin and the tissue under the skin. The infected area is usually red and tender. This happens most often in the arms and lower legs.  HOME CARE   Take your antibiotic medicine as told. Finish the medicine even if you start to feel better.  Keep the infected arm or leg raised (elevated).  Put a warm cloth on the area up to 4 times per day.  Only take medicines as told by your doctor.  Keep all doctor visits as told. GET HELP IF:  You see red streaks on the skin coming from the infected area.  Your red area gets bigger or turns a dark color.  Your bone or joint under the infected area is painful after the skin heals.  Your infection comes back in the same area or different area.  You have a puffy (swollen) bump in the  infected area.  You have new symptoms.  You have a fever. GET HELP RIGHT AWAY IF:   You feel very sleepy.  You throw up (vomit) or have watery poop (diarrhea).  You feel sick and have muscle aches and pains.   This information is not intended to replace advice given to you by your health care provider. Make sure you discuss any questions you have with your health care provider.   Document Released: 04/20/2008 Document Revised: 07/24/2015 Document Reviewed: 01/18/2012 Elsevier Interactive Patient Education 2016 Elsevier Inc.    Septic Shock Septic shock is the final, most serious stage of the body's inflammatory response to an infection (sepsis). Sepsis happens when the chemicals that are produced by your body to fight infection cause inflammation through your entire body (systemic). This can lead to problems with your blood pressure that prevent your organs from getting the oxygen they need. Septic shock results when your organs begin to fail from the drop in blood pressure. Infections that lead to sepsis often begin in the lungs, abdomen, urinary tract, reproductive system, or digestive system. CAUSES Septic shock can result from any bacterial, fungal, or viral infection in the body. Septic shock from a viral infection is rare. RISK FACTORS You may be more likely to develop septic shock from an infection if you:  Are very young or very old.  Have AIDS or another disease that weakens your body's defense system (immune system).  Have diabetes.  Have lymphoma or leukemia.  Have a disease of the lungs, intestines, reproductive system, or urinary tract.  Have been hospitalized for a long time, especially if you are using a catheter.  Had a recent surgery or medical procedure.  Have been taking antibiotic medicines or steroid medicines for a long time. SIGNS AND SYMPTOMS Signs and symptoms of septic shock may include:  Low blood pressure.  A rapid heart rate or heart  palpitations.  Shortness of breath.  Rash or discolored skin.  A very high or very low body temperature with chills.  Reduced urine output.  Feeling lightheaded, weak, or confused.  Agitation or restlessness. DIAGNOSIS Your health care provider can diagnose septic shock based on your symptoms and your medical history. Your health care provider will also perform a physical exam. Tests that will be done to confirm the diagnosis may include:  Tests to check for infection by trying to grow (culture) bacteria from samples of:  Blood.  Urine.  Brain and spinal fluid (cerebrospinal fluid or CSF).  Mucus.  Wound secretions.  Imaging tests, such as:  X-rays.  Ultrasound.  CT scans.  MRI. TREATMENT Septic shock is a medical emergency. Treatment takes place in a hospital, usually in the intensive care unit (ICU). You may be given antibiotics through an IV tube immediately.  Other possible treatments include:  Medicine to increase blood pressure (vasopressor medicines).  Steroids to reduce inflammation.  IV fluids to help with symptoms of dehydration.  Respirators or breathing machines to support breathing.  Insulin to control blood sugar.  Surgery to clean wounds or remove infected or dead tissue. This is needed in some cases.  Dialysis. SEEK IMMEDIATE MEDICAL CARE IF: Septic shock is a serious problem that is a medical emergency. Do not wait to see if the symptoms will go away. Get medical help right away. Call your local emergency services (911 in the [place] Do not drive yourself to the hospital.   This information is not intended to replace advice given to you by your health care provider. Make sure you discuss any questions you have with your health care provider.   Document Released: 07/06/2014 Document Reviewed: 07/06/2014 Elsevier Interactive Patient Education [employer].

## 2016-06-06 NOTE — Progress Notes (Signed)
Report called to [name] at [facility], all questions answered appropriately.

## 2016-06-08 LAB — CULTURE, BLOOD (ROUTINE X 2)
Culture: NO GROWTH
Culture: NO GROWTH

## 2016-08-12 IMAGING — CR DG ABDOMEN 1V
3 series · 3 of 3 positions shown · non-contrast
Comparison: None.

CLINICAL DATA: Abdominal distention and fever. Weakness. Chills.
Non-Hodgkin's lymphoma

EXAM:
ABDOMEN - 1 VIEW

[t abdomen supine (1 of 3)]
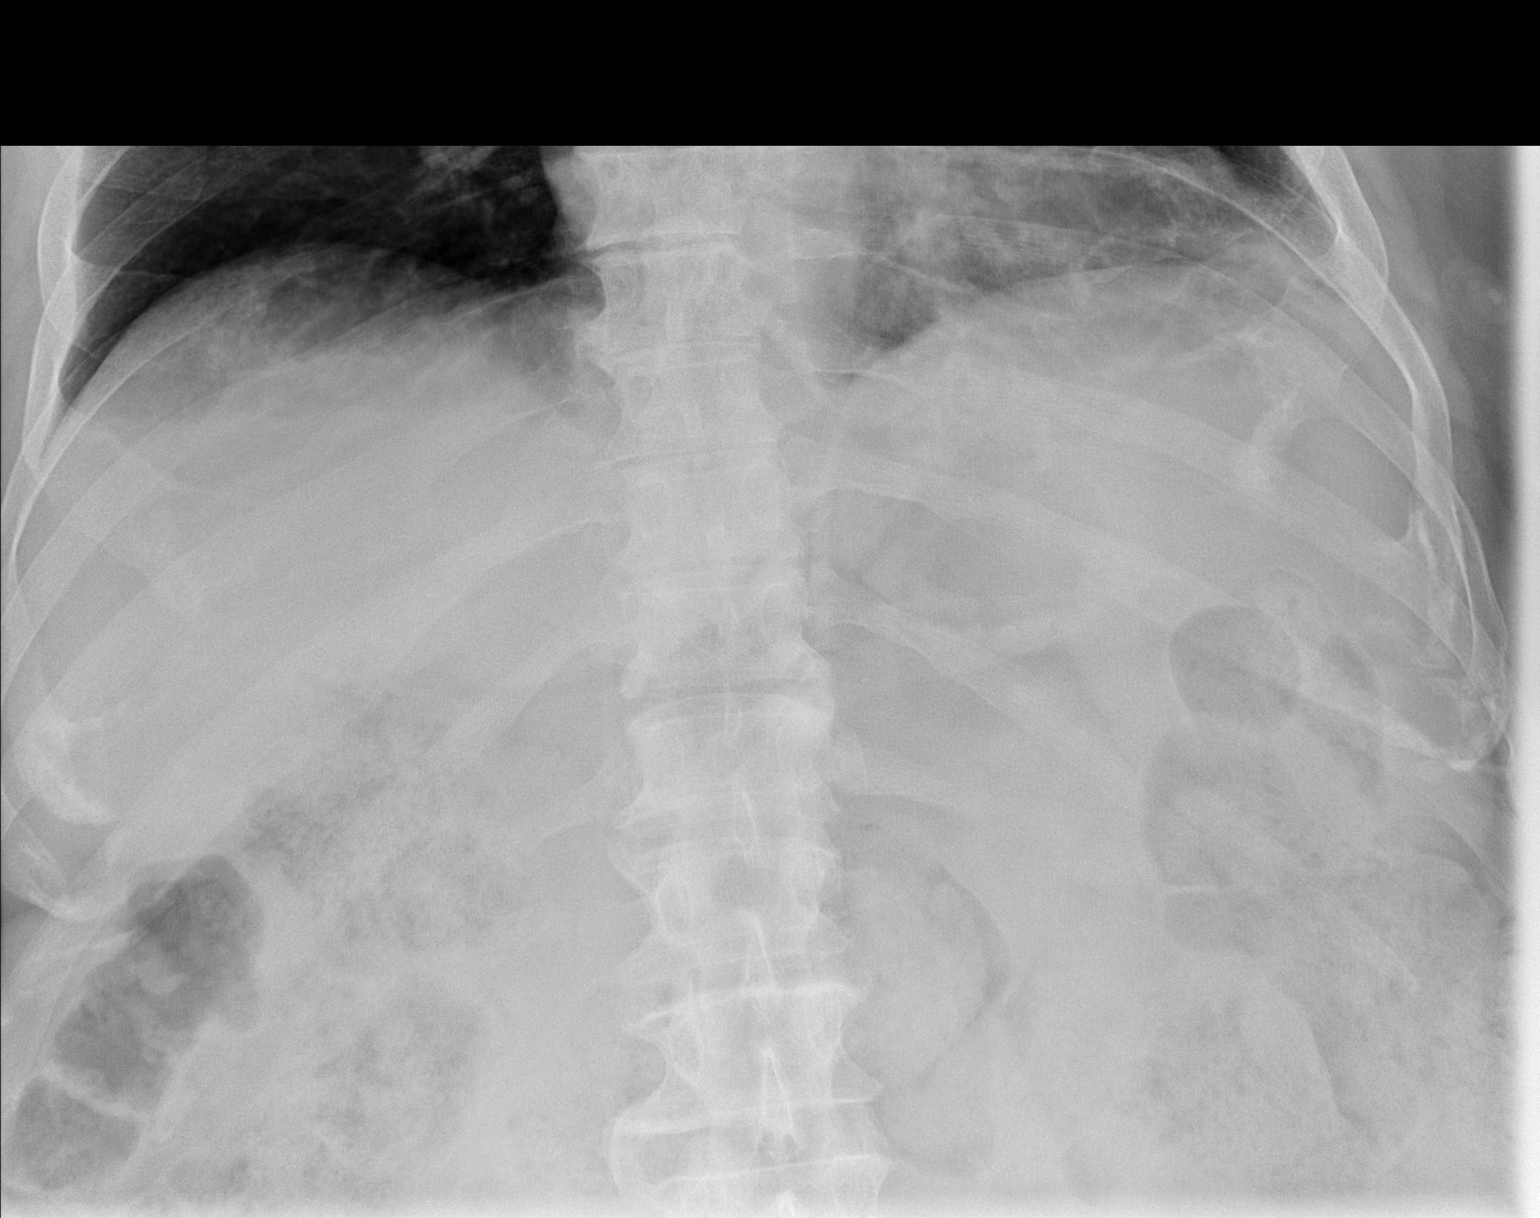

[t abdomen supine (2 of 3)]
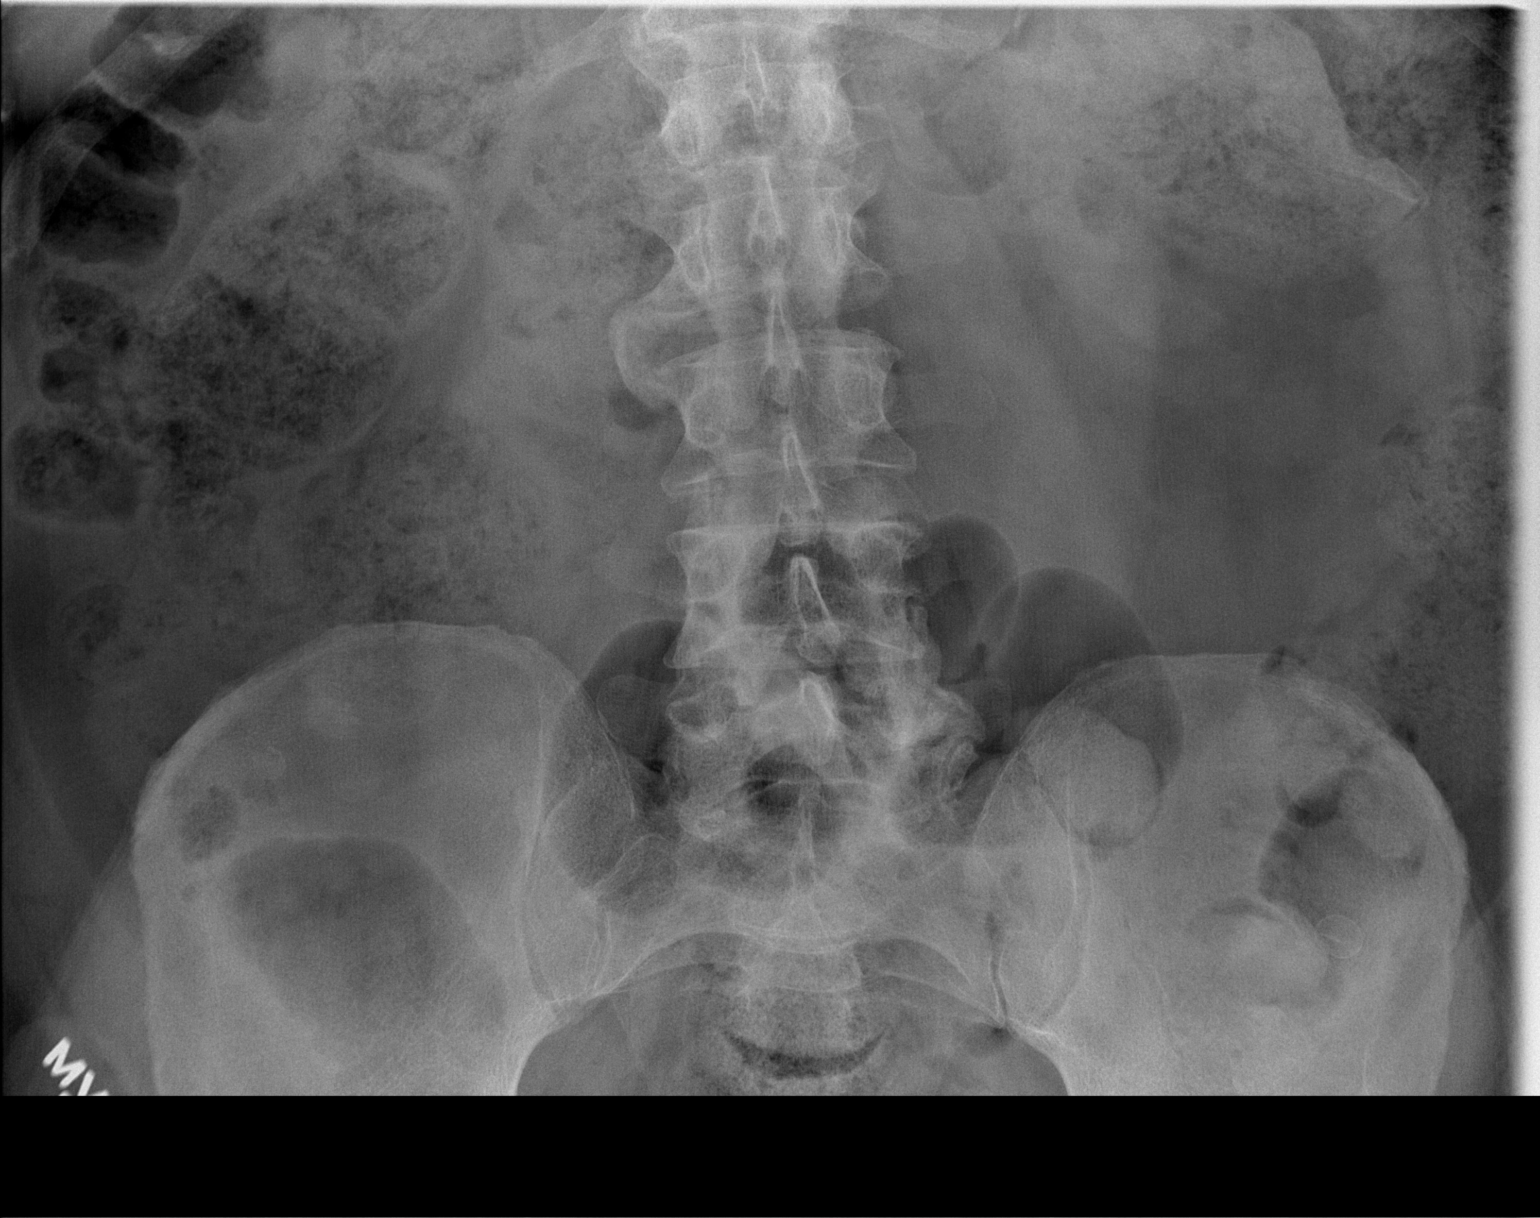

[t abdomen supine (3 of 3)]
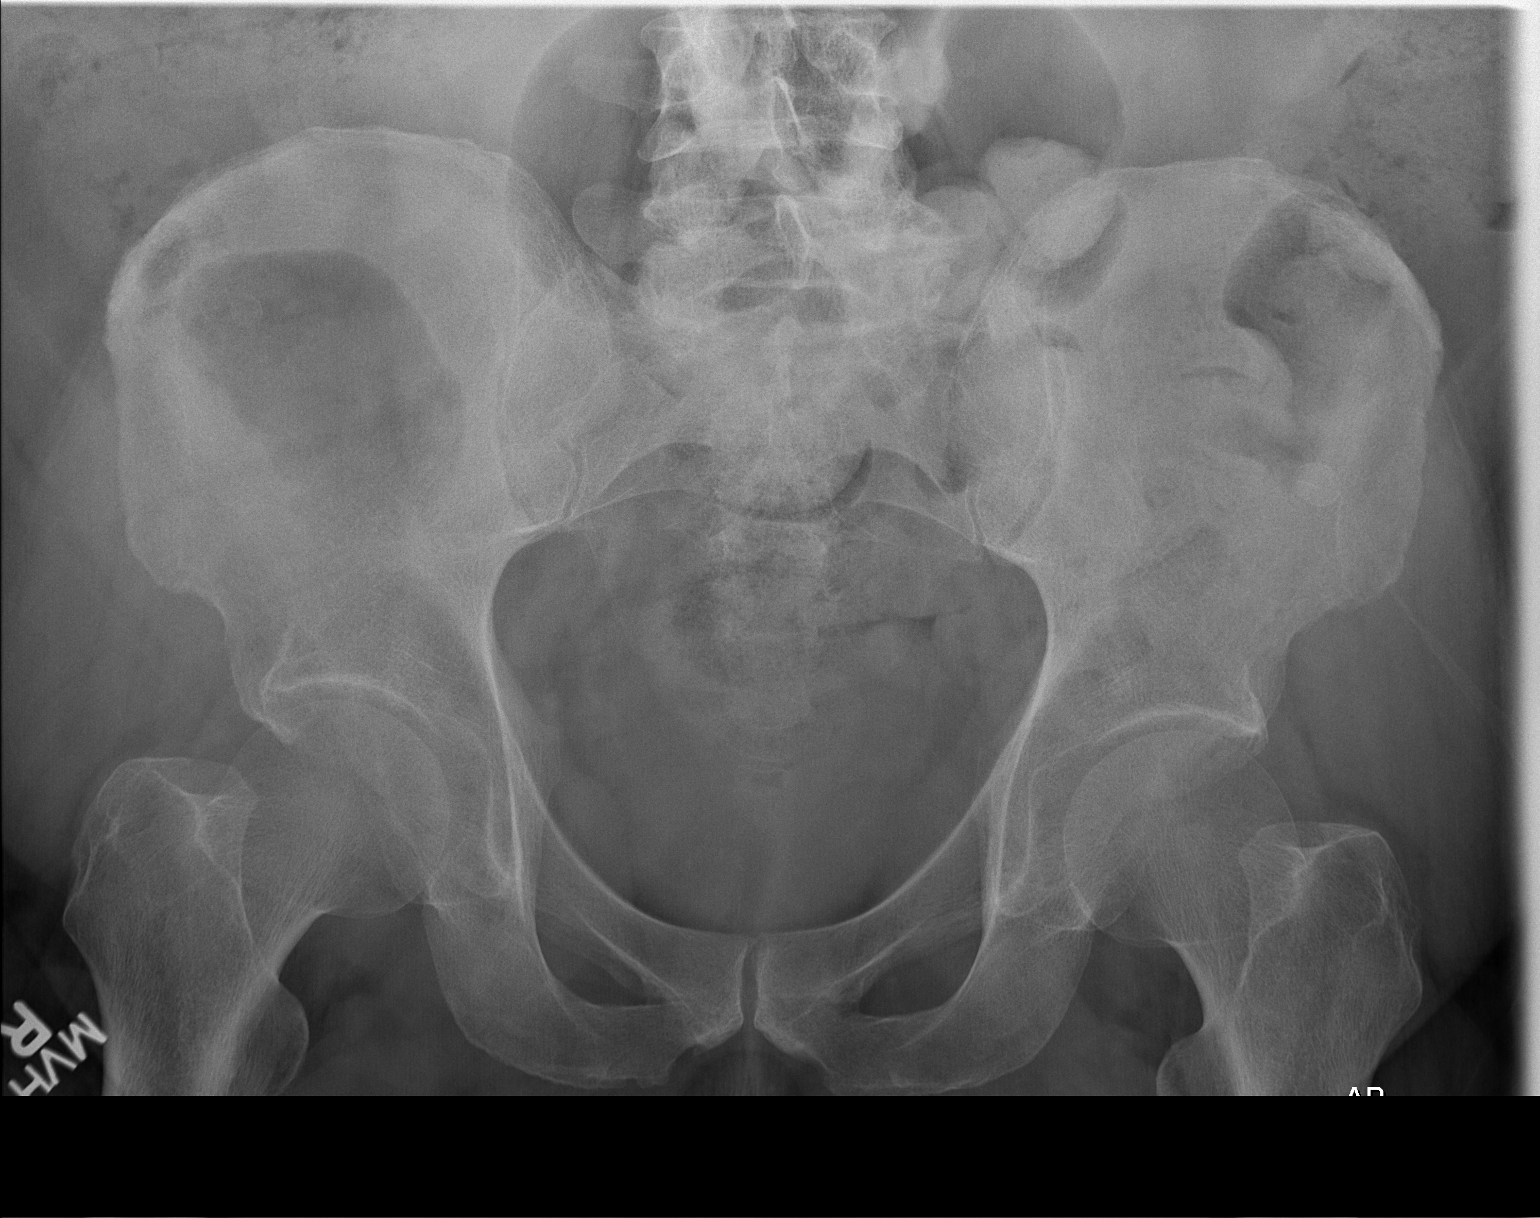

[3 of 3 positions shown; findings below may reference images not displayed]

FINDINGS: No evidence of dilated bowel loops. A large stool burden is seen
throughout the majority of the colon. No radiopaque calculi or signs
of abnormal mass effect identified.
IMPRESSION: No acute findings.

Large stool burden noted; suggest clinical correlation for possible
constipation.

## 2017-06-25 ENCOUNTER — Institutional Professional Consult (permissible substitution): Payer: Self-pay | Admitting: Internal Medicine

## 2017-06-28 ENCOUNTER — Telehealth: Payer: Self-pay

## 2017-06-28 NOTE — Telephone Encounter (Signed)
SENT NOTES TO SCHEDULING 

## 2017-07-02 ENCOUNTER — Telehealth: Payer: Self-pay | Admitting: Cardiovascular Disease

## 2017-07-02 NOTE — Telephone Encounter (Signed)
Received incoming records from [facility] for upcoming appointment on 07/29/17 @ 1:40pm with Dr. Oval Linsey. Records given to [facility] in Medical Records. 07/02/17/ab

## 2017-07-22 ENCOUNTER — Encounter: Payer: Self-pay | Admitting: *Deleted

## 2017-07-29 ENCOUNTER — Ambulatory Visit: Payer: Medicare Other | Admitting: Cardiovascular Disease

## 2017-08-18 ENCOUNTER — Institutional Professional Consult (permissible substitution): Payer: Self-pay | Admitting: Pulmonary Disease

## 2017-09-14 ENCOUNTER — Encounter: Payer: Self-pay | Admitting: Cardiovascular Disease

## 2017-09-14 ENCOUNTER — Ambulatory Visit (INDEPENDENT_AMBULATORY_CARE_PROVIDER_SITE_OTHER): Payer: Medicare Other | Admitting: Cardiovascular Disease

## 2017-09-14 VITALS — BP 149/81 | HR 104 | Wt 288.2 lb

## 2017-09-14 DIAGNOSIS — R0602 Shortness of breath: Secondary | ICD-10-CM

## 2017-09-14 DIAGNOSIS — I4510 Unspecified right bundle-branch block: Secondary | ICD-10-CM | POA: Diagnosis not present

## 2017-09-14 DIAGNOSIS — R9431 Abnormal electrocardiogram [ECG] [EKG]: Secondary | ICD-10-CM

## 2017-09-14 DIAGNOSIS — M7989 Other specified soft tissue disorders: Secondary | ICD-10-CM | POA: Diagnosis not present

## 2017-09-14 NOTE — Progress Notes (Signed)
Cardiology Office Note   Date:  09/17/2017   ID:  JADRIAN BULMAN, DOB 02/26/1951, MRN [id number]  PCP:  Janeth Rase, MD  Cardiologist:   Skeet Latch, MD   Chief Complaint  Patient presents with  . New Patient (Initial Visit)     History of Present Illness: [name] is a 66 y.o. male with hypertension, hyperlipidemia, long QT, CKD III, CLL, and schizophrenia who presents for an evaluation of abnormal EKG.  Mr. Vereen had a routine EKG for evaluation prior to taking antipsychotic medication.  He was noted to have a right bundle branch block and was referred to cardiology for evaluation.  He was treated for pneumonia 02/2017.  He reports that overall he has been doing well but was sick a couple weeks ago.  He is now feeling better.  He has exertional dyspnea.  This limits his ability to exercise.  He also has lower extremity edema but no orthopnea or PND. He is accompanied by a caregiver her reports that his dyspnea has been much worse in the last two weeks.  His edema has also increased over this same timeframe.  He denies orthopnea or PND.  For the last week he has struggled to walk or stand upright.  He hasn't fallen but has come close.  He denies chest pain or pressure.  He doesn't exercise much.  Mr. Godbee lives in a group home and struggles to perform ADLs.     Past Medical History:  Diagnosis Date  . Cellulitis   . Chronic kidney disease 09/20/2008  . Chronic lymphocytic leukemia ([place])   . cll dx'd 10/2003   no rx  . CLL (chronic lymphoblastic leukemia) 10/2003  . GERD (gastroesophageal reflux disease)   . Hiatal hernia   . Hyperlipemia   . Hypertension   . Lower extremity edema   . NHL (non-Hodgkin's lymphoma) ([place]) dx'd 08/2008 rt groin   chemo comp 08/2008  . Non Hodgkin's lymphoma ([place]) 08/2008  . Non Hodgkin's lymphoma ([place])   . Paranoid schizophrenia ([place])   . Prolonged Q-T interval on ECG   . RBBB 09/17/2017  . Schizoaffective disorder ([place])     . Sepsis ([place])   . Vitamin D deficiency     No past surgical history on file.   Current Outpatient Prescriptions  Medication Sig Dispense Refill  . aspirin EC 81 MG tablet Take 81 mg by mouth daily.    . benztropine (COGENTIN) 0.5 MG tablet Take 0.5 mg by mouth 2 (two) times daily.    . chlorproMAZINE (THORAZINE) 100 MG tablet Take 100 mg by mouth every evening.    . chlorproMAZINE (THORAZINE) 50 MG tablet Take 50 mg by mouth 2 (two) times daily.    . Cholecalciferol (VITAMIN D-3 PO) Take 5,000 mg by mouth daily.    . clotrimazole (LOTRIMIN) 1 % cream Apply 1 application topically 2 (two) times daily.    . cloZAPine (CLOZARIL) 100 MG tablet Take 200 mg by mouth at bedtime.     . clozapine (CLOZARIL) 50 MG tablet Take 50 mg by mouth 2 (two) times daily.    Marland Kitchen docusate sodium (COLACE) 100 MG capsule Take 100 mg by mouth daily.    Marland Kitchen escitalopram (LEXAPRO) 10 MG tablet Take 10 mg by mouth daily before breakfast.     . ezetimibe (ZETIA) 10 MG tablet Take 10 mg by mouth daily.    Marland Kitchen gemfibrozil (LOPID) 600 MG tablet Take 600 mg by mouth 2 (two)  times daily before a meal.    . lamoTRIgine (LAMICTAL) 25 MG tablet Take 50 mg by mouth 2 (two) times daily.     Marland Kitchen levothyroxine (SYNTHROID, LEVOTHROID) 100 MCG tablet Take 100 mcg by mouth daily before breakfast.     . lithium carbonate 300 MG capsule Take 300 mg by mouth 3 (three) times daily with meals.    . mineral oil liquid Place into the right ear once a week. Instill 1-2 drops into the right ear once weekly.    . Omega-3 Fatty Acids (FISH OIL) 1000 MG CAPS Take 4,000 mg by mouth daily.    . polyethylene glycol (MIRALAX / GLYCOLAX) packet Take 17 g by mouth daily.    Marland Kitchen senna (SENOKOT) 8.6 MG tablet Take 1 tablet by mouth daily.     No current facility-administered medications for this visit.     Allergies:   Sulfamethoxazole    Social History:  The patient  reports that he has never smoked. He has never used smokeless tobacco. He reports  that he does not drink alcohol or use drugs.   Family History:  The patient's family history includes Alcohol abuse in his father.    ROS:  Please see the history of present illness.   Otherwise, review of systems are positive for none.   All other systems are reviewed and negative.    PHYSICAL EXAM: VS:  BP (!) 149/81   Pulse (!) 104   Wt 130.7 kg (288 lb 3.2 oz)   BMI 39.09 kg/m  , BMI Body mass index is 39.09 kg/m. GENERAL:  Well appearing HEENT:  Pupils equal round and reactive, fundi not visualized, oral mucosa unremarkable NECK:  +JVD. Waveform within normal limits, carotid upstroke brisk and symmetric, no bruits, no thyromegaly LYMPHATICS:  No cervical adenopathy LUNGS:  Diminished at the bases.  No crackles, wheezes or rhonchi HEART:  RRR.  PMI not displaced or sustained,S1 and S2 within normal limits, no S3, no S4, no clicks, no rubs, no murmurs ABD:  Flat, positive bowel sounds normal in frequency in pitch, no bruits, no rebound, no guarding, no midline pulsatile mass, no hepatomegaly, no splenomegaly EXT:  2 plus pulses throughout, 2+ pitting edema to the upper tibia bilaterally, no cyanosis no clubbing SKIN:  No rashes no nodules NEURO:  Cranial nerves II through XII grossly intact, motor grossly intact throughout PSYCH:  Cognitively intact, oriented to person place and time    EKG:  EKG is ordered today. The ekg ordered today demonstrates sinus tachycardia.  Rate 104 bpm.  LAFB.  RBBB.  QTc 504 ms.     Recent Labs: 09/14/2017: BUN 21; Creatinine, Ser 2.14; NT-Pro BNP 57; Potassium 4.6; Sodium 143   03/02/17: Sodium 144, potassium 4.1, BUN 23, creatinine 1.89   Lipid Panel    Component Value Date/Time   CHOL  11/13/2009 0655    158        ATP III CLASSIFICATION:  <200     mg/dL   Desirable  200-239  mg/dL   Borderline High  >=240    mg/dL   High          TRIG 267 (H) 11/13/2009 0655   HDL 38 (L) 11/13/2009 0655   CHOLHDL 4.2 11/13/2009 0655   VLDL 53  (H) 11/13/2009 0655   LDLCALC  11/13/2009 0655    67        Total Cholesterol/HDL:CHD Risk Coronary Heart Disease Risk Table  Men   Women  1/2 Average Risk   3.4   3.3  Average Risk       5.0   4.4  2 X Average Risk   9.6   7.1  3 X Average Risk  23.4   11.0        Use the calculated Patient Ratio above and the CHD Risk Table to determine the patient's CHD Risk.        ATP III CLASSIFICATION (LDL):  <100     mg/dL   Optimal  100-129  mg/dL   Near or Above                    Optimal  130-159  mg/dL   Borderline  160-189  mg/dL   High  >190     mg/dL   Very High      Wt Readings from Last 3 Encounters:  09/14/17 130.7 kg (288 lb 3.2 oz)  06/06/16 132.9 kg (292 lb 15.9 oz)  02/17/16 132.6 kg (292 lb 4.8 oz)      ASSESSMENT AND PLAN:  # QTc Prolongation: # RBBB: Mr. Limones QTc is mildly prolonged at 504 ms.  It has been longer in the past.  This is likely due to several of his antipsychotic medications (chlorpromazine, clozapine, and lexapro).  Avoid adding any additional QT prolonging medications.  RBBB is chronic and not clinically significant.  Repeat EKG every 6 months for QT monitoring.    # Shortness of breath: # Edema: Will get an echo to assess for heart failure.  Given his edema and tachycardia as well as sedentary lifestyle, there is also concern for DVT/PE.  Check D-dimer and V/Q scan.  CKD prohibits chest CT-A.  We will also check a d-dimer.     Current medicines are reviewed at length with the patient today.  The patient does not have concerns regarding medicines.  The following changes have been made:  no change  Labs/ tests ordered today include:   Orders Placed This Encounter  Procedures  . NM Pulmonary Perf and Vent  . DG Chest 2 View  . Basic metabolic panel  . Pro b natriuretic peptide (BNP)  . D-Dimer, Quantitative  . EKG 12-Lead  . ECHOCARDIOGRAM COMPLETE     Disposition:   FU with Lastacia Solum C. Oval Linsey, MD, [facility] in 1  month.   This note was written with the assistance of speech recognition software.  Please excuse any transcriptional errors.  Signed, Makailah Slavick C. Oval Linsey, MD, [facility]  09/17/2017 1:32 PM    [facility]

## 2017-09-14 NOTE — Patient Instructions (Addendum)
Medication Instructions:  Your physician recommends that you continue on your current medications as directed. Please refer to the Current Medication list given to you today.  Labwork: BMET/D-DIMER/BNP TODAY   Testing/Procedures: A chest x-ray takes a picture of the organs and structures inside the chest, including the heart, lungs, and blood vessels. This test can show several things, including, whether the heart is enlarges; whether fluid is building up in the lungs; and whether pacemaker / defibrillator leads are still in place.  VQ SCAN   Your physician has requested that you have a lower or upper extremity venous duplex. This test is an ultrasound of the veins in the legs or arms. It looks at venous blood flow that carries blood from the heart to the legs or arms. Allow one hour for a Lower Venous exam. Allow thirty minutes for an Upper Venous exam. There are no restrictions or special instructions. LOWER EXTREMITY  Your physician has requested that you have an echocardiogram. Echocardiography is a painless test that uses sound waves to create images of your heart. It provides your doctor with information about the size and shape of your heart and how well your heart's chambers and valves are working. This procedure takes approximately one hour. There are no restrictions for this procedure. [place] [address]   Follow-Up: Your physician recommends that you schedule a follow-up appointment in: 1 MONTH OV   Any Other Special Instructions Will Be Listed Below (If Applicable)     Ventilation-Perfusion Scan A ventilation-perfusion scan is a scan to look at the airflow (ventilation) and blood flow (perfusion) in your lungs. It is most often used to look for blood clots that may have traveled to your lungs. During this scan, radioactive compounds are injected into your body or are breathed in (inhale). These radioactive compounds are detected by a special camera during  the scan, are given at very low doses, are not harmful to you, and last in your body for a very short time. Tell a health care provider about:  Any allergies you have.  All medicines you are taking, including vitamins, herbs, eye drops, creams, and over-the-counter medicines.  Any blood disorders you have.  Any surgeries you have had.  Any medical conditions you have.  Possibility of pregnancy, if this applies.  Breastfeeding, if this applies. What are the risks? Generally, this is a safe procedure. However, as with any procedure, complications can occur. A possible complication includes having an allergic reaction to the radioactive compounds. What happens before the procedure?  Do not smoke before your test.  Take medicine as directed by your health care provider. What happens during the procedure?  A small needle will be placed in a vein in your arm or hand. This needle will stay in place for the entire exam.  A small amount of very short-acting radioactive material will be injected.  Your lungs will then be scanned using a special camera. This camera will record the images.  You will be asked to inhale a second radioactive compound. After this, the lungs are scanned again. What happens after the procedure?  You may go home unless your health care provider instructs you differently.  You may continue with normal activities and diet as instructed by your health care provider. This information is not intended to replace advice given to you by your health care provider. Make sure you discuss any questions you have with your health care provider. Document Released: 10/30/2000 Document  Revised: 04/09/2016 Document Reviewed: 05/18/2013 Elsevier Interactive Patient Education  2017 [employer].

## 2017-09-15 LAB — BASIC METABOLIC PANEL
BUN/Creatinine Ratio: 10 (ref 10–24)
BUN: 21 mg/dL (ref 8–27)
CALCIUM: 9.9 mg/dL (ref 8.6–10.2)
CO2: 22 mmol/L (ref 20–29)
CREATININE: 2.14 mg/dL — AB (ref 0.76–1.27)
Chloride: 103 mmol/L (ref 96–106)
GFR calc Af Amer: 36 mL/min/{1.73_m2} — ABNORMAL LOW (ref >59)
GFR, EST NON AFRICAN AMERICAN: 32 mL/min/{1.73_m2} — AB (ref >59)
Glucose: 142 mg/dL — ABNORMAL HIGH (ref 65–99)
POTASSIUM: 4.6 mmol/L (ref 3.5–5.2)
Sodium: 143 mmol/L (ref 134–144)

## 2017-09-15 LAB — PRO B NATRIURETIC PEPTIDE: NT-Pro BNP: 57 pg/mL (ref 0–210)

## 2017-09-15 LAB — D-DIMER, QUANTITATIVE (NOT AT ARMC): D-DIMER: 0.43 mg{FEU}/L (ref 0.00–0.49)

## 2017-09-17 ENCOUNTER — Encounter: Payer: Self-pay | Admitting: Cardiovascular Disease

## 2017-09-17 DIAGNOSIS — I4510 Unspecified right bundle-branch block: Secondary | ICD-10-CM | POA: Insufficient documentation

## 2017-09-17 HISTORY — DX: Unspecified right bundle-branch block: I45.10

## 2017-09-20 ENCOUNTER — Ambulatory Visit (HOSPITAL_COMMUNITY): Admission: RE | Admit: 2017-09-20 | Payer: Medicare Other | Source: Ambulatory Visit

## 2017-09-23 ENCOUNTER — Other Ambulatory Visit (HOSPITAL_COMMUNITY): Payer: Self-pay

## 2017-09-29 ENCOUNTER — Institutional Professional Consult (permissible substitution): Payer: Self-pay | Admitting: Internal Medicine

## 2017-10-02 ENCOUNTER — Inpatient Hospital Stay (HOSPITAL_COMMUNITY)
Admission: EM | Admit: 2017-10-02 | Discharge: 2017-10-04 | DRG: 871 | Disposition: A | Discharge status: Home / Self Care | Payer: Medicare Other | Attending: Internal Medicine | Admitting: Internal Medicine

## 2017-10-02 ENCOUNTER — Encounter (HOSPITAL_COMMUNITY): Payer: Self-pay

## 2017-10-02 ENCOUNTER — Emergency Department (HOSPITAL_COMMUNITY): Payer: Medicare Other

## 2017-10-02 ENCOUNTER — Encounter (HOSPITAL_COMMUNITY): Payer: Self-pay | Admitting: Emergency Medicine

## 2017-10-02 DIAGNOSIS — N183 Chronic kidney disease, stage 3 unspecified: Secondary | ICD-10-CM | POA: Diagnosis present

## 2017-10-02 DIAGNOSIS — L03115 Cellulitis of right lower limb: Secondary | ICD-10-CM | POA: Diagnosis present

## 2017-10-02 DIAGNOSIS — E559 Vitamin D deficiency, unspecified: Secondary | ICD-10-CM | POA: Diagnosis present

## 2017-10-02 DIAGNOSIS — C8590 Non-Hodgkin lymphoma, unspecified, unspecified site: Secondary | ICD-10-CM | POA: Diagnosis present

## 2017-10-02 DIAGNOSIS — Z856 Personal history of leukemia: Secondary | ICD-10-CM

## 2017-10-02 DIAGNOSIS — I130 Hypertensive heart and chronic kidney disease with heart failure and stage 1 through stage 4 chronic kidney disease, or unspecified chronic kidney disease: Secondary | ICD-10-CM | POA: Diagnosis present

## 2017-10-02 DIAGNOSIS — D72829 Elevated white blood cell count, unspecified: Secondary | ICD-10-CM | POA: Diagnosis present

## 2017-10-02 DIAGNOSIS — I509 Heart failure, unspecified: Secondary | ICD-10-CM | POA: Diagnosis present

## 2017-10-02 DIAGNOSIS — L8990 Pressure ulcer of unspecified site, unspecified stage: Secondary | ICD-10-CM | POA: Diagnosis present

## 2017-10-02 DIAGNOSIS — K219 Gastro-esophageal reflux disease without esophagitis: Secondary | ICD-10-CM | POA: Diagnosis present

## 2017-10-02 DIAGNOSIS — J181 Lobar pneumonia, unspecified organism: Secondary | ICD-10-CM | POA: Diagnosis present

## 2017-10-02 DIAGNOSIS — Z6838 Body mass index (BMI) 38.0-38.9, adult: Secondary | ICD-10-CM | POA: Diagnosis not present

## 2017-10-02 DIAGNOSIS — E785 Hyperlipidemia, unspecified: Secondary | ICD-10-CM | POA: Diagnosis present

## 2017-10-02 DIAGNOSIS — F259 Schizoaffective disorder, unspecified: Secondary | ICD-10-CM | POA: Diagnosis present

## 2017-10-02 DIAGNOSIS — E039 Hypothyroidism, unspecified: Secondary | ICD-10-CM | POA: Diagnosis present

## 2017-10-02 DIAGNOSIS — J440 Chronic obstructive pulmonary disease with acute lower respiratory infection: Secondary | ICD-10-CM | POA: Diagnosis present

## 2017-10-02 DIAGNOSIS — M7989 Other specified soft tissue disorders: Secondary | ICD-10-CM

## 2017-10-02 DIAGNOSIS — F200 Paranoid schizophrenia: Secondary | ICD-10-CM | POA: Diagnosis present

## 2017-10-02 DIAGNOSIS — C9110 Chronic lymphocytic leukemia of B-cell type not having achieved remission: Secondary | ICD-10-CM | POA: Diagnosis present

## 2017-10-02 DIAGNOSIS — A419 Sepsis, unspecified organism: Secondary | ICD-10-CM | POA: Diagnosis present

## 2017-10-02 DIAGNOSIS — L89312 Pressure ulcer of right buttock, stage 2: Secondary | ICD-10-CM | POA: Diagnosis present

## 2017-10-02 DIAGNOSIS — E669 Obesity, unspecified: Secondary | ICD-10-CM | POA: Diagnosis present

## 2017-10-02 DIAGNOSIS — I4510 Unspecified right bundle-branch block: Secondary | ICD-10-CM | POA: Diagnosis present

## 2017-10-02 DIAGNOSIS — Z79899 Other long term (current) drug therapy: Secondary | ICD-10-CM

## 2017-10-02 DIAGNOSIS — F209 Schizophrenia, unspecified: Secondary | ICD-10-CM | POA: Diagnosis present

## 2017-10-02 DIAGNOSIS — R9431 Abnormal electrocardiogram [ECG] [EKG]: Secondary | ICD-10-CM | POA: Diagnosis not present

## 2017-10-02 DIAGNOSIS — N390 Urinary tract infection, site not specified: Secondary | ICD-10-CM | POA: Diagnosis present

## 2017-10-02 DIAGNOSIS — Z8572 Personal history of non-Hodgkin lymphomas: Secondary | ICD-10-CM | POA: Diagnosis present

## 2017-10-02 DIAGNOSIS — D649 Anemia, unspecified: Secondary | ICD-10-CM | POA: Diagnosis present

## 2017-10-02 HISTORY — DX: Heart failure, unspecified: I50.9

## 2017-10-02 HISTORY — DX: Chronic obstructive pulmonary disease, unspecified: J44.9

## 2017-10-02 LAB — CBC WITH DIFFERENTIAL/PLATELET
BASOS PCT: 0 %
Basophils Absolute: 0 10*3/uL (ref 0.0–0.1)
Eosinophils Absolute: 0.1 10*3/uL (ref 0.0–0.7)
Eosinophils Relative: 1 %
HEMATOCRIT: 38.8 % — AB (ref 39.0–52.0)
HEMOGLOBIN: 12.8 g/dL — AB (ref 13.0–17.0)
LYMPHS ABS: 0.9 10*3/uL (ref 0.7–4.0)
LYMPHS PCT: 6 %
MCH: 31.4 pg (ref 26.0–34.0)
MCHC: 33 g/dL (ref 30.0–36.0)
MCV: 95.1 fL (ref 78.0–100.0)
MONO ABS: 0.3 10*3/uL (ref 0.1–1.0)
MONOS PCT: 2 %
NEUTROS ABS: 13.5 10*3/uL — AB (ref 1.7–7.7)
NEUTROS PCT: 91 %
Platelets: 149 10*3/uL — ABNORMAL LOW (ref 150–400)
RBC: 4.08 MIL/uL — ABNORMAL LOW (ref 4.22–5.81)
RDW: 14.1 % (ref 11.5–15.5)
WBC: 14.8 10*3/uL — ABNORMAL HIGH (ref 4.0–10.5)

## 2017-10-02 LAB — LACTIC ACID, PLASMA
LACTIC ACID, VENOUS: 1.8 mmol/L (ref 0.5–1.9)
LACTIC ACID, VENOUS: 2.7 mmol/L — AB (ref 0.5–1.9)

## 2017-10-02 LAB — RESPIRATORY PANEL BY PCR
ADENOVIRUS-RVPPCR: NOT DETECTED
Bordetella pertussis: NOT DETECTED
CHLAMYDOPHILA PNEUMONIAE-RVPPCR: NOT DETECTED
CORONAVIRUS 229E-RVPPCR: NOT DETECTED
CORONAVIRUS HKU1-RVPPCR: NOT DETECTED
CORONAVIRUS NL63-RVPPCR: NOT DETECTED
CORONAVIRUS OC43-RVPPCR: NOT DETECTED
Influenza A: NOT DETECTED
Influenza B: NOT DETECTED
MYCOPLASMA PNEUMONIAE-RVPPCR: NOT DETECTED
Metapneumovirus: NOT DETECTED
PARAINFLUENZA VIRUS 1-RVPPCR: NOT DETECTED
Parainfluenza Virus 2: NOT DETECTED
Parainfluenza Virus 3: NOT DETECTED
Parainfluenza Virus 4: NOT DETECTED
Respiratory Syncytial Virus: NOT DETECTED
Rhinovirus / Enterovirus: NOT DETECTED

## 2017-10-02 LAB — URINALYSIS, ROUTINE W REFLEX MICROSCOPIC
Bilirubin Urine: NEGATIVE
GLUCOSE, UA: NEGATIVE mg/dL
HGB URINE DIPSTICK: NEGATIVE
KETONES UR: NEGATIVE mg/dL
Leukocytes, UA: NEGATIVE
Nitrite: NEGATIVE
PROTEIN: NEGATIVE mg/dL
Specific Gravity, Urine: 1.016 (ref 1.005–1.030)
pH: 5 (ref 5.0–8.0)

## 2017-10-02 LAB — COMPREHENSIVE METABOLIC PANEL
ALBUMIN: 4 g/dL (ref 3.5–5.0)
ALK PHOS: 113 U/L (ref 38–126)
ALT: 13 U/L — ABNORMAL LOW (ref 17–63)
ANION GAP: 12 (ref 5–15)
AST: 23 U/L (ref 15–41)
BILIRUBIN TOTAL: 0.5 mg/dL (ref 0.3–1.2)
BUN: 22 mg/dL — AB (ref 6–20)
CALCIUM: 9.4 mg/dL (ref 8.9–10.3)
CO2: 20 mmol/L — ABNORMAL LOW (ref 22–32)
Chloride: 107 mmol/L (ref 101–111)
Creatinine, Ser: 2.37 mg/dL — ABNORMAL HIGH (ref 0.61–1.24)
GFR calc Af Amer: 32 mL/min — ABNORMAL LOW (ref >60)
GFR, EST NON AFRICAN AMERICAN: 27 mL/min — AB (ref >60)
GLUCOSE: 160 mg/dL — AB (ref 65–99)
POTASSIUM: 4.2 mmol/L (ref 3.5–5.1)
Sodium: 139 mmol/L (ref 135–145)
TOTAL PROTEIN: 6.4 g/dL — AB (ref 6.5–8.1)

## 2017-10-02 LAB — LITHIUM LEVEL

## 2017-10-02 LAB — I-STAT CG4 LACTIC ACID, ED
Lactic Acid, Venous: 2.95 mmol/L (ref 0.5–1.9)
Lactic Acid, Venous: 2.72 mmol/L (ref 0.5–1.9)

## 2017-10-02 LAB — PROCALCITONIN: PROCALCITONIN: 17.11 ng/mL

## 2017-10-02 MED ORDER — DOCUSATE SODIUM 100 MG PO CAPS
100.0000 mg | ORAL_CAPSULE | Freq: Every day | ORAL | Status: DC
  Administered 2017-10-03 – 2017-10-04 (×2): 100 mg via ORAL
  Filled 2017-10-02 (×2): qty 1

## 2017-10-02 MED ORDER — EZETIMIBE 10 MG PO TABS: 10.0000 mg | ORAL_TABLET | Freq: Every day | ORAL | Status: DC

## 2017-10-02 MED ORDER — CLOZAPINE 25 MG PO TABS: 50.0000 mg | ORAL_TABLET | Freq: Two times a day (BID) | ORAL | Status: DC

## 2017-10-02 MED ORDER — VITAMIN D 1000 UNITS PO TABS
5000.0000 [IU] | ORAL_TABLET | Freq: Every day | ORAL | Status: DC
  Administered 2017-10-03 – 2017-10-04 (×2): 5000 [IU] via ORAL
  Filled 2017-10-02 (×2): qty 5

## 2017-10-02 MED ORDER — GEMFIBROZIL 600 MG PO TABS
600.0000 mg | ORAL_TABLET | Freq: Two times a day (BID) | ORAL | Status: DC
  Administered 2017-10-02 – 2017-10-04 (×4): 600 mg via ORAL
  Filled 2017-10-02 (×5): qty 1

## 2017-10-02 MED ORDER — CLOZAPINE 25 MG PO TABS
250.0000 mg | ORAL_TABLET | Freq: Every day | ORAL | Status: DC
  Administered 2017-10-02 – 2017-10-03 (×2): 250 mg via ORAL
  Filled 2017-10-02 (×2): qty 2

## 2017-10-02 MED ORDER — LAMOTRIGINE 25 MG PO TABS
50.0000 mg | ORAL_TABLET | Freq: Two times a day (BID) | ORAL | Status: DC
  Administered 2017-10-02 – 2017-10-04 (×4): 50 mg via ORAL
  Filled 2017-10-02 (×4): qty 2

## 2017-10-02 MED ORDER — PIPERACILLIN-TAZOBACTAM 3.375 G IVPB
3.3750 g | Freq: Three times a day (TID) | INTRAVENOUS | Status: DC
  Administered 2017-10-02 – 2017-10-04 (×6): 3.375 g via INTRAVENOUS
  Filled 2017-10-02 (×8): qty 50

## 2017-10-02 MED ORDER — CLOTRIMAZOLE 1 % EX CREA: 1.0000 "application " | TOPICAL_CREAM | Freq: Two times a day (BID) | CUTANEOUS | Status: DC

## 2017-10-02 MED ORDER — BENZTROPINE MESYLATE 0.5 MG PO TABS
0.5000 mg | ORAL_TABLET | Freq: Two times a day (BID) | ORAL | Status: DC
  Administered 2017-10-02 – 2017-10-04 (×4): 0.5 mg via ORAL
  Filled 2017-10-02 (×4): qty 1

## 2017-10-02 MED ORDER — SODIUM CHLORIDE 0.9 % IV SOLN
INTRAVENOUS | Status: DC
  Administered 2017-10-02 – 2017-10-04 (×3): via INTRAVENOUS

## 2017-10-02 MED ORDER — VITAMIN D-3 125 MCG (5000 UT) PO TABS: 5000.0000 mg | ORAL_TABLET | Freq: Every day | ORAL | Status: DC

## 2017-10-02 MED ORDER — OMEGA-3-ACID ETHYL ESTERS 1 G PO CAPS
4.0000 g | ORAL_CAPSULE | Freq: Every day | ORAL | Status: DC
  Administered 2017-10-03 – 2017-10-04 (×2): 4 g via ORAL
  Filled 2017-10-02 (×2): qty 4

## 2017-10-02 MED ORDER — PIPERACILLIN-TAZOBACTAM 3.375 G IVPB 30 MIN
3.3750 g | Freq: Once | INTRAVENOUS | Status: AC
Start: 2017-10-02 — End: 2017-10-02
  Administered 2017-10-02: 3.375 g via INTRAVENOUS
  Filled 2017-10-02: qty 50

## 2017-10-02 MED ORDER — VANCOMYCIN HCL 10 G IV SOLR
2500.0000 mg | Freq: Once | INTRAVENOUS | Status: AC
  Administered 2017-10-02: 2500 mg via INTRAVENOUS
  Filled 2017-10-02: qty 2500

## 2017-10-02 MED ORDER — ASPIRIN EC 81 MG PO TBEC
81.0000 mg | DELAYED_RELEASE_TABLET | Freq: Every day | ORAL | Status: DC
  Administered 2017-10-02 – 2017-10-04 (×3): 81 mg via ORAL
  Filled 2017-10-02 (×3): qty 1

## 2017-10-02 MED ORDER — ESCITALOPRAM OXALATE 10 MG PO TABS
10.0000 mg | ORAL_TABLET | Freq: Every day | ORAL | Status: DC
  Administered 2017-10-03 – 2017-10-04 (×2): 10 mg via ORAL
  Filled 2017-10-02 (×2): qty 1

## 2017-10-02 MED ORDER — ACETAMINOPHEN 500 MG PO TABS
1000.0000 mg | ORAL_TABLET | Freq: Once | ORAL | Status: AC
  Administered 2017-10-02: 1000 mg via ORAL
  Filled 2017-10-02: qty 2

## 2017-10-02 MED ORDER — VANCOMYCIN HCL IN DEXTROSE 1-5 GM/200ML-% IV SOLN: 1000.0000 mg | Freq: Once | INTRAVENOUS | Status: DC

## 2017-10-02 MED ORDER — HEPARIN SODIUM (PORCINE) 5000 UNIT/ML IJ SOLN
5000.0000 [IU] | Freq: Three times a day (TID) | INTRAMUSCULAR | Status: DC
  Administered 2017-10-02 – 2017-10-04 (×6): 5000 [IU] via SUBCUTANEOUS
  Filled 2017-10-02 (×6): qty 1

## 2017-10-02 MED ORDER — LITHIUM CARBONATE 300 MG PO CAPS: 300.0000 mg | ORAL_CAPSULE | Freq: Three times a day (TID) | ORAL | Status: DC

## 2017-10-02 MED ORDER — VANCOMYCIN HCL 10 G IV SOLR
1500.0000 mg | INTRAVENOUS | Status: DC
  Administered 2017-10-03 – 2017-10-04 (×2): 1500 mg via INTRAVENOUS
  Filled 2017-10-02 (×2): qty 1500

## 2017-10-02 MED ORDER — LEVOTHYROXINE SODIUM 100 MCG PO TABS
100.0000 ug | ORAL_TABLET | Freq: Every day | ORAL | Status: DC
  Administered 2017-10-03 – 2017-10-04 (×2): 100 ug via ORAL
  Filled 2017-10-02 (×2): qty 1

## 2017-10-02 MED ORDER — SODIUM CHLORIDE 0.9 % IV BOLUS (SEPSIS)
1000.0000 mL | Freq: Once | INTRAVENOUS | Status: AC
  Administered 2017-10-02: 1000 mL via INTRAVENOUS

## 2017-10-02 NOTE — ED Notes (Signed)
Broken area on right butt check pink pad applied.

## 2017-10-02 NOTE — ED Provider Notes (Signed)
[facility] EMERGENCY DEPARTMENT Provider Note   CSN: [id number] Arrival date & time: 10/02/17  0021     History   Chief Complaint Chief Complaint  Patient presents with  . Nausea  . Shaking    HPI [name] is a 66 y.o. male.  Patient with a history of schizophrenia, CKD, lymphoma, CLL, HTN, HLD, prolonged QT (504 on 09/10/17 - Dr. Oval Linsey)  from group home with complaint of nausea and shaking. The patient does not contribute significantly to history. He denies pain and states he is thirsty. Unknown baseline mental status. Medical records reviewed. History of right LE cellulitis.   The history is provided by the patient and the EMS personnel. No language interpreter was used.    Past Medical History:  Diagnosis Date  . Cellulitis   . Chronic kidney disease 09/20/2008  . Chronic lymphocytic leukemia ([place])   . cll dx'd 10/2003   no rx  . CLL (chronic lymphoblastic leukemia) 10/2003  . GERD (gastroesophageal reflux disease)   . Hiatal hernia   . Hyperlipemia   . Hypertension   . Lower extremity edema   . NHL (non-Hodgkin's lymphoma) ([place]) dx'd 08/2008 rt groin   chemo comp 08/2008  . Non Hodgkin's lymphoma ([place]) 08/2008  . Non Hodgkin's lymphoma ([place])   . Paranoid schizophrenia ([place])   . Prolonged Q-T interval on ECG   . RBBB 09/17/2017  . Schizoaffective disorder ([place])   . Sepsis ([place])   . Vitamin D deficiency     Patient Active Problem List   Diagnosis Date Noted  . RBBB 09/17/2017  . Shock ([place]) 06/03/2016  . PNA (pneumonia) 02/12/2016  . Cellulitis of right leg 04/06/2015  . Cellulitis 04/06/2015  . Cellulitis of right lower extremity 03/28/2015  . Sepsis ([place]) 03/27/2015  . Prolonged QT interval   . Exertional dyspnea 05/29/2014  . CLL (chronic lymphocytic leukemia) ([place]) 09/12/2013  . Non Hodgkin's lymphoma ([place]) 09/12/2013  . CAP (community acquired pneumonia) 12/24/2012  . Leukocytosis 12/24/2012  . Anemia 12/24/2012    . CKD (chronic kidney disease), stage III ([place]) 12/24/2012  . Schizophrenia ([place]) 12/24/2012    History reviewed. No pertinent surgical history.     Home Medications    Prior to Admission medications   Medication Sig Start Date End Date Taking? Authorizing Provider  aspirin EC 81 MG tablet Take 81 mg by mouth daily.    [provider]  benztropine (COGENTIN) 0.5 MG tablet Take 0.5 mg by mouth 2 (two) times daily.    [provider]  chlorproMAZINE (THORAZINE) 100 MG tablet Take 100 mg by mouth every evening.    [provider]  chlorproMAZINE (THORAZINE) 50 MG tablet Take 50 mg by mouth 2 (two) times daily.    [provider]  Cholecalciferol (VITAMIN D-3 PO) Take 5,000 mg by mouth daily.    [provider]  clotrimazole (LOTRIMIN) 1 % cream Apply 1 application topically 2 (two) times daily.    [provider]  cloZAPine (CLOZARIL) 100 MG tablet Take 200 mg by mouth at bedtime.     [provider]  clozapine (CLOZARIL) 50 MG tablet Take 50 mg by mouth 2 (two) times daily.    [provider]  docusate sodium (COLACE) 100 MG capsule Take 100 mg by mouth daily.    [provider]  escitalopram (LEXAPRO) 10 MG tablet Take 10 mg by mouth daily before breakfast.     [provider]  ezetimibe (  ZETIA) 10 MG tablet Take 10 mg by mouth daily.    [provider]  gemfibrozil (LOPID) 600 MG tablet Take 600 mg by mouth 2 (two) times daily before a meal.    [provider]  lamoTRIgine (LAMICTAL) 25 MG tablet Take 50 mg by mouth 2 (two) times daily.     [provider]  levothyroxine (SYNTHROID, LEVOTHROID) 100 MCG tablet Take 100 mcg by mouth daily before breakfast.     [provider]  lithium carbonate 300 MG capsule Take 300 mg by mouth 3 (three) times daily with meals.    [provider]  mineral oil liquid Place into the right ear once a week. Instill 1-2 drops  into the right ear once weekly.    [provider]  Omega-3 Fatty Acids (FISH OIL) 1000 MG CAPS Take 4,000 mg by mouth daily.    [provider]  polyethylene glycol (MIRALAX / GLYCOLAX) packet Take 17 g by mouth daily.    [provider]  senna (SENOKOT) 8.6 MG tablet Take 1 tablet by mouth daily.    [provider]    Family History Family History  Problem Relation Age of Onset  . Alcohol abuse Father     Social History Social History   Tobacco Use  . Smoking status: Never Smoker  . Smokeless tobacco: Never Used  Substance Use Topics  . Alcohol use: No  . Drug use: No     Allergies   Sulfamethoxazole   Review of Systems Review of Systems  Unable to perform ROS: Psychiatric disorder     Physical Exam Updated Vital Signs BP (!) 108/97   Pulse (!) 116   Temp 98.9 F (37.2 C) (Oral)   Resp 15   SpO2 94%   Physical Exam  Constitutional: He appears well-developed and well-nourished. No distress.  HENT:  Head: Normocephalic and atraumatic.  Mouth/Throat: Oropharynx is clear and moist.  Eyes: Conjunctivae are normal.  Neck: Normal range of motion. Neck supple.  Cardiovascular: Normal rate and regular rhythm.  No murmur heard. Pulmonary/Chest: Effort normal and breath sounds normal. He has no wheezes. He has no rales.  Poor effort on exam.   Abdominal: Soft. Bowel sounds are normal. There is no tenderness. There is no rebound and no guarding.  Musculoskeletal: Normal range of motion.  Neurological: He is alert.  Skin: Skin is warm and dry. No rash noted.  Right lower leg is erythematous without swelling. Mildly warm to touch. No lesion, blister or rash.  Psychiatric: He has a normal mood and affect.     ED Treatments / Results  Labs (all labs ordered are listed, but only abnormal results are displayed) Labs Reviewed  URINE CULTURE  CBC WITH DIFFERENTIAL/PLATELET  COMPREHENSIVE METABOLIC PANEL  URINALYSIS, ROUTINE W  REFLEX MICROSCOPIC  LITHIUM LEVEL   Results for orders placed or performed during the hospital encounter of 10/02/17  CBC with Differential  Result Value Ref Range   WBC 14.8 (H) 4.0 - 10.5 K/uL   RBC 4.08 (L) 4.22 - 5.81 MIL/uL   Hemoglobin 12.8 (L) 13.0 - 17.0 g/dL   HCT 38.8 (L) 39.0 - 52.0 %   MCV 95.1 78.0 - 100.0 fL   MCH 31.4 26.0 - 34.0 pg   MCHC 33.0 30.0 - 36.0 g/dL   RDW 14.1 11.5 - 15.5 %   Platelets 149 (L) 150 - 400 K/uL   Neutrophils Relative % 91 %   Neutro Abs 13.5 (H) 1.7 -  7.7 K/uL   Lymphocytes Relative 6 %   Lymphs Abs 0.9 0.7 - 4.0 K/uL   Monocytes Relative 2 %   Monocytes Absolute 0.3 0.1 - 1.0 K/uL   Eosinophils Relative 1 %   Eosinophils Absolute 0.1 0.0 - 0.7 K/uL   Basophils Relative 0 %   Basophils Absolute 0.0 0.0 - 0.1 K/uL  Comprehensive metabolic panel  Result Value Ref Range   Sodium 139 135 - 145 mmol/L   Potassium 4.2 3.5 - 5.1 mmol/L   Chloride 107 101 - 111 mmol/L   CO2 20 (L) 22 - 32 mmol/L   Glucose, Bld 160 (H) 65 - 99 mg/dL   BUN 22 (H) 6 - 20 mg/dL   Creatinine, Ser 2.37 (H) 0.61 - 1.24 mg/dL   Calcium 9.4 8.9 - 10.3 mg/dL   Total Protein 6.4 (L) 6.5 - 8.1 g/dL   Albumin 4.0 3.5 - 5.0 g/dL   AST 23 15 - 41 U/L   ALT 13 (L) 17 - 63 U/L   Alkaline Phosphatase 113 38 - 126 U/L   Total Bilirubin 0.5 0.3 - 1.2 mg/dL   GFR calc non Af Amer 27 (L) >60 mL/min   GFR calc Af Amer 32 (L) >60 mL/min   Anion gap 12 5 - 15  Lithium level  Result Value Ref Range   Lithium Lvl <0.06 (L) 0.60 - 1.20 mmol/L  I-Stat CG4 Lactic Acid, ED  Result Value Ref Range   Lactic Acid, Venous 2.95 (HH) 0.5 - 1.9 mmol/L   Comment NOTIFIED PHYSICIAN     EKG  EKG Interpretation None       Radiology No results found.  Procedures Procedures (including critical care time)  Medications Ordered in ED Medications  sodium chloride 0.9 % bolus 1,000 mL (not administered)     Initial Impression / Assessment and Plan / ED Course  I have reviewed  the triage vital signs and the nursing notes.  Pertinent labs & imaging results that were available during my care of the patient were reviewed by me and considered in my medical decision making (see chart for details).     Patient here from group home with rigors and nausea. Unknown duration but seems to be over the last 1-2 days. No vomiting. No reported fever.   The patient does not contribute to history. He is found to be febrile, tachycardic, tachypneic. Sepsis work up initiated. vanc and Zosyn started. Cultures pending.   The right leg appears red without swelling. He has a history of cellulitis in this leg. Unknown if this is the source of infection. Urine collection still pending.   Discussed with Dr. Hal Hope who accepts the patient onto his service.   Final Clinical Impressions(s) / ED Diagnoses   Final diagnoses:  None   1. Sepsis  ED Discharge Orders    None       Charlann Lange, PA-C 10/02/17 4081    Merryl Hacker, MD 10/03/17 [phone]

## 2017-10-02 NOTE — H&P (Signed)
History and Physical    [name] DVV:[id number] DOB: [birth year] DOA: 10/02/2017   PCP: Janeth Rase, MD   Patient coming from:  Home    Chief Complaint: shaking chills   HPI: [name] is a 66 y.o. male with a history of CKD, NHL CLL, HLD, schizophrenia, history of prolonged QT, GERD, history of right lower extremity cellulitis. hypertension, hyperlipidemia, brought from his group home due to complaints of nausea, and rigors.  History is obtained by chart, as the patient is unable to provide detailed history due to decreased mental capacity.  Unknown baseline mental status.  At the ER, he was found to be febrile, with a T-max of 103.8, tachycardic, tachypneic.  Sepsis workup was initiated, given 1 L of IV fluid, vancomycin and Zosyn.  Cultures were drawn, currently pending.  Other history is unable to be obtained.   ED Course:  BP 111/64   Pulse 90   Temp (S) (!) 103.8 F (39.9 C) (Rectal)   Resp 16   Wt 127 kg (280 lb) Comment: patient reported   SpO2 97%   BMI 37.97 kg/m   As mentioned above, the patient received vancomycin and Zosyn after cultures were drawn, as well as 1 L of IV fluids.  He is still very ill appearing. At this time, workup for sepsis is pending, rule out the possibility of right  lower extremity cellulitis as well. Urine culture pending. Lactic acid was initially 2.95, now 2.72. White count 14.8, in the setting of known CLL Platelets 149. Bicarb 20 Creatinine 2.37.  Review of Systems:  As per HPI, otherwise other history is unable to be obtained due to patient mental capacity limitations.  Past Medical History:  Diagnosis Date  . Cellulitis   . CHF (congestive heart failure) ([place])   . Chronic kidney disease 09/20/2008  . Chronic lymphocytic leukemia ([place])   . cll dx'd 10/2003   no rx  . CLL (chronic lymphoblastic leukemia) 10/2003  . COPD (chronic obstructive pulmonary disease) ([place])   . GERD (gastroesophageal reflux disease)   .  Hiatal hernia   . Hyperlipemia   . Hypertension   . Lower extremity edema   . NHL (non-Hodgkin's lymphoma) ([place]) dx'd 08/2008 rt groin   chemo comp 08/2008  . Non Hodgkin's lymphoma ([place]) 08/2008  . Non Hodgkin's lymphoma ([place])   . Paranoid schizophrenia ([place])   . Prolonged Q-T interval on ECG   . RBBB 09/17/2017  . Schizoaffective disorder ([place])   . Sepsis ([place])   . Vitamin D deficiency     History reviewed. No pertinent surgical history.  Social History Social History   Socioeconomic History  . Marital status: Single    Spouse name: Not on file  . Number of children: Not on file  . Years of education: Not on file  . Highest education level: Not on file  Social Needs  . Financial resource strain: Not on file  . Food insecurity - worry: Not on file  . Food insecurity - inability: Not on file  . Transportation needs - medical: Not on file  . Transportation needs - non-medical: Not on file  Occupational History  . Not on file  Tobacco Use  . Smoking status: Never Smoker  . Smokeless tobacco: Never Used  Substance and Sexual Activity  . Alcohol use: No  . Drug use: No  . Sexual activity: No  Other Topics Concern  . Not on file  Social History Narrative  . Not on  file     Allergies  Allergen Reactions  . Sulfamethoxazole Rash    Family History  Problem Relation Age of Onset  . Alcohol abuse Father       Prior to Admission medications   Medication Sig Start Date End Date Taking? Authorizing Provider  aspirin EC 81 MG tablet Take 81 mg by mouth daily.    [provider]  benztropine (COGENTIN) 0.5 MG tablet Take 0.5 mg by mouth 2 (two) times daily.    [provider]  chlorproMAZINE (THORAZINE) 100 MG tablet Take 100 mg by mouth every evening.    [provider]  chlorproMAZINE (THORAZINE) 50 MG tablet Take 50 mg by mouth 2 (two) times daily.    [provider]  Cholecalciferol (VITAMIN D-3 PO) Take 5,000 mg by mouth  daily.    [provider]  clotrimazole (LOTRIMIN) 1 % cream Apply 1 application topically 2 (two) times daily.    [provider]  cloZAPine (CLOZARIL) 100 MG tablet Take 200 mg by mouth at bedtime.     [provider]  clozapine (CLOZARIL) 50 MG tablet Take 50 mg by mouth 2 (two) times daily.    [provider]  docusate sodium (COLACE) 100 MG capsule Take 100 mg by mouth daily.    [provider]  escitalopram (LEXAPRO) 10 MG tablet Take 10 mg by mouth daily before breakfast.     [provider]  ezetimibe (ZETIA) 10 MG tablet Take 10 mg by mouth daily.    [provider]  gemfibrozil (LOPID) 600 MG tablet Take 600 mg by mouth 2 (two) times daily before a meal.    [provider]  lamoTRIgine (LAMICTAL) 25 MG tablet Take 50 mg by mouth 2 (two) times daily.     [provider]  levothyroxine (SYNTHROID, LEVOTHROID) 100 MCG tablet Take 100 mcg by mouth daily before breakfast.     [provider]  lithium carbonate 300 MG capsule Take 300 mg by mouth 3 (three) times daily with meals.    [provider]  mineral oil liquid Place into the right ear once a week. Instill 1-2 drops into the right ear once weekly.    [provider]  Omega-3 Fatty Acids (FISH OIL) 1000 MG CAPS Take 4,000 mg by mouth daily.    [provider]  polyethylene glycol (MIRALAX / GLYCOLAX) packet Take 17 g by mouth daily.    [provider]  senna (SENOKOT) 8.6 MG tablet Take 1 tablet by mouth daily.    [provider]    Physical Exam:  Vitals:   10/02/17 0700 10/02/17 0730 10/02/17 0830 10/02/17 0900  BP: (!) 100/50 (!) 104/53 (!) 114/58 111/64  Pulse: 95 93 93 90  Resp: (!) 23 (!) 24 (!) 23 16  Temp:      TempSrc:      SpO2: 95% 95% 96% 97%  Weight:       Constitutional: NAD, but appears uncomfortable, ill appearing. Eyes: PERRL, lids and conjunctivae normal ENMT: Mucous  membranes are moist, without exudate or lesions  Neck: normal, supple, no masses, no thyromegaly Respiratory:  essentilally clear to auscultation bilaterally, no wheezing, no crackles.  The patient has poor effort on exam. Cardiovascular: Regular rate and rhythm,  murmur, rubs or gallops.  There is right lower extremity edema, warm, and there is an area of erythema in the right leg, but without lesions, blisters or rash.  2+ pedal pulses. No carotid  bruits.  Abdomen: Soft, non tender, No hepatosplenomegaly. Bowel sounds positive.  Musculoskeletal: no clubbing / cyanosis. Moves all extremities Skin: no jaundice, No lesions.  Neurologic: Sensation intact  Strength equal in all extremities    Labs on Admission: I have personally reviewed following labs and imaging studies  CBC: Recent Labs  Lab 10/02/17 0051  WBC 14.8*  NEUTROABS 13.5*  HGB 12.8*  HCT 38.8*  MCV 95.1  PLT 149*    Basic Metabolic Panel: Recent Labs  Lab 10/02/17 0051  NA 139  K 4.2  CL 107  CO2 20*  GLUCOSE 160*  BUN 22*  CREATININE 2.37*  CALCIUM 9.4    GFR: CrCl cannot be calculated (Unknown ideal weight.).  Liver Function Tests: Recent Labs  Lab 10/02/17 0051  AST 23  ALT 13*  ALKPHOS 113  BILITOT 0.5  PROT 6.4*  ALBUMIN 4.0   No results for input(s): LIPASE, AMYLASE in the last 168 hours. No results for input(s): AMMONIA in the last 168 hours.  Coagulation Profile: No results for input(s): INR, PROTIME in the last 168 hours.  Cardiac Enzymes: No results for input(s): CKTOTAL, CKMB, CKMBINDEX, TROPONINI in the last 168 hours.  BNP (last 3 results) Recent Labs    09/14/17 1527  PROBNP 57    HbA1C: No results for input(s): HGBA1C in the last 72 hours.  CBG: No results for input(s): GLUCAP in the last 168 hours.  Lipid Profile: No results for input(s): CHOL, HDL, LDLCALC, TRIG, CHOLHDL, LDLDIRECT in the last 72 hours.  Thyroid Function Tests: No results for input(s): TSH,  T4TOTAL, FREET4, T3FREE, THYROIDAB in the last 72 hours.  Anemia Panel: No results for input(s): VITAMINB12, FOLATE, FERRITIN, TIBC, IRON, RETICCTPCT in the last 72 hours.  Urine analysis:    Component Value Date/Time   COLORURINE YELLOW 06/03/2016 1700   APPEARANCEUR CLEAR 06/03/2016 1700   LABSPEC 1.014 06/03/2016 1700   PHURINE 5.0 06/03/2016 1700   GLUCOSEU NEGATIVE 06/03/2016 1700   HGBUR NEGATIVE 06/03/2016 1700   BILIRUBINUR NEGATIVE 06/03/2016 1700   KETONESUR NEGATIVE 06/03/2016 1700   PROTEINUR NEGATIVE 06/03/2016 1700   UROBILINOGEN 0.2 05/09/2015 1740   NITRITE NEGATIVE 06/03/2016 1700   LEUKOCYTESUR NEGATIVE 06/03/2016 1700    Sepsis Labs: @LABRCNTIP (procalcitonin:4,lacticidven:4) )No results found for this or any previous visit (from the past 240 hour(s)).   Radiological Exams on Admission: Dg Chest 2 View  Result Date: 10/02/2017 CLINICAL DATA:  66 year old male with sepsis. EXAM: CHEST  2 VIEW COMPARISON:  Chest radiograph dated 06/04/2016 FINDINGS: There is shallow inspiration. An area of increased density at the left lung base likely atelectatic changes although pneumonia is not excluded. Small left pleural effusion may be present. No pneumothorax. Stable cardiac silhouette. No acute osseous pathology. IMPRESSION: Shallow inspiration with left lung base atelectatic changes versus infiltrate. Clinical correlation is recommended. Electronically Signed   By: Anner Crete M.D.   On: 10/02/2017 02:59    EKG: Independently reviewed.  Assessment/Plan Active Problems:   Sepsis ([place])   Cellulitis of right lower extremity   Anemia   CKD (chronic kidney disease), stage III ([facility])   Schizophrenia ([facility])   CLL (chronic lymphocytic leukemia) ([facility])   Non Hodgkin's lymphoma ([facility])   Prolonged QT interval   RBBB   Sepsis likely due to  cellulitis, organism unknown  Patient meets criteria given  fever, leukocytosis, and evidence of organ dysfunction.  Antibiotics  delivered in the ED with Vanc and Zosyn. RLE  shows area of erythema and  swelling without open lesion.  UA pending Received  1L  l NS to date CXR shows possible infiltrate versus atelectasis in the left lung base Admit to IP tele Sepsis order set  IV antibiotics by pharmacy with Vanco and Zosyn  RLE ultrasound  Follow lactic acid q 6 hrs Follow blood   cultures IV fluids at 100 cc/h.  Procalcitonin order set  CBC in am Antipyretics Resp Vir panel   Chronic kidney disease stage 3, BL 1.7 -1.9 current 2.37   Lab Results  Component Value Date   CREATININE 2.37 (H) 10/02/2017   CREATININE 2.14 (H) 09/14/2017   CREATININE 1.78 (H) 06/05/2016  IVF Repeat CMET in am   Hyperlipidemia Continue home Lopid, Lovaza, Zetia  Hypothyroidism: Continue home Synthroid  Anemia of chronic disease Hemoglobin on admission 12.8, stable Repeat CBC in am  No transfusion is indicated at this time   Schizophrenia, Depression Continue Lamictal, Clozapine, Lexapro, Lithium. Discussed with Pharmacy regarding this meds due to history of QT prolongation    History of CLL and NHL, not on treatment, current WBC 14  Monitor CBC   DVT prophylaxis:  Heparin  Code Status:    Full  Family Communication:  Discussed with patient Disposition Plan: Expect patient to be discharged to group home after condition improves Consults called:    None  Admission status: Tele Inpatient    Sharene Butters, PA-C [facility]   10/02/2017, 10:05 AM

## 2017-10-02 NOTE — ED Notes (Signed)
Clean sheets placed on stretcher and patient repositioned in bed

## 2017-10-02 NOTE — ED Triage Notes (Signed)
Per EMS pt from a residential home for folks with mental health disabilities.  Pt not communicating well. Only periodically answers questions appropriately.  Unclear if this is baseline for this pt.  At one point complained to EMS of nausea and "shaking". VS 97% RA, CBG 176 162/84, pulse 120,

## 2017-10-02 NOTE — ED Notes (Signed)
Initially patient was very short with responses, eventually patient divulged that he has been shaking for the past day for unknown reasons.   Stated he has right sided hip pain that he has all the time and states he had a stroke in 74s (unsure of exact year). Patient can follow commands and answer questions appropriately.

## 2017-10-02 NOTE — ED Notes (Signed)
Attempted to start 2nd bolus per sepsis protocol, IV site occluded.

## 2017-10-02 NOTE — Progress Notes (Signed)
Pharmacy Antibiotic Note  [name] is a 66 y.o. male admitted on 10/02/2017 with sepsis.  Pharmacy has been consulted for Vancomycin and Zosyn dosing.  Tmax 103.8, WBC 14.8 and LA 2.95. SCr 2.37 for estimated nCrCl ~ 30 mL/min.   Plan: Vancomycin 2.5g IV x1, then 1.5g IV q24hr  Zosyn 3.375g IV q8hr Vancomycin trough at SS and PRN (goal 15-20 mcg/mL) Monitor renal function, clinical picture, and culture data F/u length of therapy and de-escalation   Temp (24hrs), Avg:101.4 F (38.6 C), Min:98.9 F (37.2 C), Max:103.8 F (39.9 C)  Recent Labs  Lab 10/02/17 0051 10/02/17 0130  WBC 14.8*  --   CREATININE 2.37*  --   LATICACIDVEN  --  2.95*    CrCl cannot be calculated (Unknown ideal weight.).    Allergies  Allergen Reactions  . Sulfamethoxazole Rash    Antimicrobials this admission: 11/17 Vanc >>  11/17 Zosyn >>   Microbiology results: pending   Lavonda Jumbo, PharmD Clinical Pharmacist 10/02/17 2:42 AM

## 2017-10-02 NOTE — ED Notes (Signed)
Pt states unable to void at this time. 

## 2017-10-02 NOTE — ED Notes (Signed)
Lunch tray ordered; regular diet 

## 2017-10-03 ENCOUNTER — Inpatient Hospital Stay (HOSPITAL_COMMUNITY): Payer: Medicare Other

## 2017-10-03 ENCOUNTER — Other Ambulatory Visit: Payer: Self-pay

## 2017-10-03 DIAGNOSIS — F200 Paranoid schizophrenia: Secondary | ICD-10-CM

## 2017-10-03 DIAGNOSIS — I4510 Unspecified right bundle-branch block: Secondary | ICD-10-CM

## 2017-10-03 DIAGNOSIS — N183 Chronic kidney disease, stage 3 (moderate): Secondary | ICD-10-CM

## 2017-10-03 DIAGNOSIS — E039 Hypothyroidism, unspecified: Secondary | ICD-10-CM

## 2017-10-03 DIAGNOSIS — E785 Hyperlipidemia, unspecified: Secondary | ICD-10-CM | POA: Diagnosis present

## 2017-10-03 DIAGNOSIS — A419 Sepsis, unspecified organism: Principal | ICD-10-CM

## 2017-10-03 DIAGNOSIS — E669 Obesity, unspecified: Secondary | ICD-10-CM | POA: Diagnosis present

## 2017-10-03 DIAGNOSIS — R9431 Abnormal electrocardiogram [ECG] [EKG]: Secondary | ICD-10-CM

## 2017-10-03 DIAGNOSIS — L8990 Pressure ulcer of unspecified site, unspecified stage: Secondary | ICD-10-CM | POA: Diagnosis present

## 2017-10-03 DIAGNOSIS — M7989 Other specified soft tissue disorders: Secondary | ICD-10-CM

## 2017-10-03 DIAGNOSIS — J181 Lobar pneumonia, unspecified organism: Secondary | ICD-10-CM

## 2017-10-03 DIAGNOSIS — L03115 Cellulitis of right lower limb: Secondary | ICD-10-CM

## 2017-10-03 LAB — URINE CULTURE: Culture: NO GROWTH

## 2017-10-03 NOTE — Plan of Care (Signed)
  Education: Knowledge of General Education information will improve 10/03/2017 0846 - Completed/Met by Evert Kohl, RN   Clinical Measurements: Will remain free from infection 10/03/2017 0846 - Completed/Met by Evert Kohl, RN   Clinical Measurements: Will remain free from infection 10/03/2017 0846 - Completed/Met by Evert Kohl, RN

## 2017-10-03 NOTE — Progress Notes (Addendum)
Progress Note    [name]  SFK:[id number] DOB: 23-Jun-1951  DOA: 10/02/2017 PCP: Janeth Rase, MD    Brief Narrative:   Chief complaint: Follow-up sepsis  Medical records reviewed and are as summarized below:  [name] is an 66 y.o. male with a PMH of NHL, CLL, schizophrenia (from an ALF), hyperlipidemia, hypertension and chronic kidney disease who was admitted 10/02/17 with fever/sepsis from cellulitis versus pneumonia. Empirically placed on vancomycin/Zosyn.  Assessment/Plan:   Principal Problem:   Sepsis ([place]) secondary to cellulitis of the RLE versus left lower lobar pneumonia Tachycardic, tachypneic with elevated lactate, Pro calcitonin and WBC with history fever. Fluid volume resuscitated in the ED and placed on empiric broad-spectrum antibiotics with vancomycin and Zosyn. Chest x-ray showed possible left lower lobe pneumonia and the patient has evidence of cellulitis of the RLE. Respiratory virus panel negative. Follow-up cultures and continue broad-spectrum antibiotics for now.  Active Problems:   Anemia Hemoglobin stable at 12.8 mg/dL.    CKD (chronic kidney disease), stage III (HCC) Baseline creatinine 2-2.5. Current creatinine consistent with usual baseline values.    Schizophrenia ([place]) Continue Lamictal, Clozapine, Lexapro, Lithium and Cogentin.     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  In addition, he has a history of chronic lymphocytic leukemia diagnosed in December 2004.Completed a total of 7 cycles of multi-agent chemotherapy from late November 2009 through late March 2010.      Prolonged QT interval/right bundle branch block Avoid meds that might exacerbate.    Stage II Pressure injury of skin of buttocks Wound care per nursing.    Hyperlipidemia Continue home Lopid, Lovaza, Zetia.   Hypothyroidism Continue Synthroid.    Obesity Body mass index is 38.3 kg/m.   Family Communication/Anticipated D/C date and plan/Code Status   DVT prophylaxis: Heparin ordered. Code Status: Full Code.  Family Communication: No family at bedside. Disposition Plan: Discharged back to group home when sepsis resolved.   Medical Consultants:    None.   Anti-Infectives:    Vancomycin 10/02/17--->  Zosyn 10/02/17--->  Subjective:   Patient is minimally verbal. Does not consistently answer my questions. Very lethargic.  Objective:    Vitals:   10/02/17 2108 10/03/17 0103 10/03/17 0352 10/03/17 0807  BP: (!) 115/54 (!) 110/56 100/62 (!) 120/58  Pulse: 95 95 91 88  Resp: 20 17  18   Temp: 98.2 F (36.8 C) 98.6 F (37 C) 98 F (36.7 C) 99.1 F (37.3 C)  TempSrc: Oral Oral Oral Oral  SpO2: 96% 93% 94% 97%  Weight:   131.7 kg (290 lb 4.8 oz)   Height:        Intake/Output Summary (Last 24 hours) at 10/03/2017 0822 Last data filed at 10/03/2017 0820 Gross per 24 hour  Intake 872 ml  Output 1050 ml  Net -178 ml   Filed Weights   10/02/17 0200 10/02/17 1554 10/03/17 0352  Weight: 127 kg (280 lb) 131.8 kg (290 lb 8 oz) 131.7 kg (290 lb 4.8 oz)    Exam: General: Lethargic. Cardiovascular: Heart sounds show a regular rate, and rhythm. No gallops or rubs. No murmurs. No JVD. Lungs: Diminished breath sounds, poor effort but no frank wheeze. Abdomen: Soft, nontender, nondistended with normal active bowel sounds. No masses. No hepatosplenomegaly. Neurological: Lethargic. Moves all extremities 4 with equal strength. No obvious cranial nerve deficits. Skin: Warm and  dry. Right lower extremity erythema/swelling. Extremities: No clubbing or cyanosis. 1-2 plus edema, worse on the right. Pedal pulses 2+. Psychiatric: Mood and affect are depressed/flat. Insight and judgment are poor.  Data Reviewed:   I have personally reviewed following labs and imaging  studies:  Labs: Labs show the following:   Basic Metabolic Panel: Recent Labs  Lab 10/02/17 0051  NA 139  K 4.2  CL 107  CO2 20*  GLUCOSE 160*  BUN 22*  CREATININE 2.37*  CALCIUM 9.4   GFR Estimated Creatinine Clearance: 44.8 mL/min (A) (by C-G formula based on SCr of 2.37 mg/dL (H)). Liver Function Tests: Recent Labs  Lab 10/02/17 0051  AST 23  ALT 13*  ALKPHOS 113  BILITOT 0.5  PROT 6.4*  ALBUMIN 4.0   CBC: Recent Labs  Lab 10/02/17 0051  WBC 14.8*  NEUTROABS 13.5*  HGB 12.8*  HCT 38.8*  MCV 95.1  PLT 149*   BNP (last 3 results) Recent Labs    09/14/17 1527  PROBNP 57   Sepsis Labs: Recent Labs  Lab 10/02/17 0051 10/02/17 0130 10/02/17 0503 10/02/17 1018 10/02/17 1027 10/02/17 1326  PROCALCITON  --   --   --  17.11  --   --   WBC 14.8*  --   --   --   --   --   LATICACIDVEN  --  2.95* 2.72*  --  1.8 2.7*    Microbiology Recent Results (from the past 240 hour(s))  Respiratory Panel by PCR     Status: None   Collection Time: 10/02/17  4:28 PM  Result Value Ref Range Status   Adenovirus NOT DETECTED NOT DETECTED Final   Coronavirus 229E NOT DETECTED NOT DETECTED Final   Coronavirus HKU1 NOT DETECTED NOT DETECTED Final   Coronavirus NL63 NOT DETECTED NOT DETECTED Final   Coronavirus OC43 NOT DETECTED NOT DETECTED Final   Metapneumovirus NOT DETECTED NOT DETECTED Final   Rhinovirus / Enterovirus NOT DETECTED NOT DETECTED Final   Influenza A NOT DETECTED NOT DETECTED Final   Influenza B NOT DETECTED NOT DETECTED Final   Parainfluenza Virus 1 NOT DETECTED NOT DETECTED Final   Parainfluenza Virus 2 NOT DETECTED NOT DETECTED Final   Parainfluenza Virus 3 NOT DETECTED NOT DETECTED Final   Parainfluenza Virus 4 NOT DETECTED NOT DETECTED Final   Respiratory Syncytial Virus NOT DETECTED NOT DETECTED Final   Bordetella pertussis NOT DETECTED NOT DETECTED Final   Chlamydophila pneumoniae NOT DETECTED NOT DETECTED Final   Mycoplasma pneumoniae NOT  DETECTED NOT DETECTED Final    Procedures and diagnostic studies:  10/03/17: PCXR: My independent review of the image shows: Haziness on the left which might be reflective of a left lower lobe pneumonia but lungs hypoexpanded.   10/02/17: DG chest 2 view: My independent review of the image shows: Hypoexpanded lungs with a left base infiltrate.  Medications:   . aspirin EC  81 mg Oral Daily  . benztropine  0.5 mg Oral BID  . cholecalciferol  5,000 Units Oral Daily  . cloZAPine  250 mg Oral QHS  . docusate sodium  100 mg Oral Daily  . escitalopram  10 mg Oral QAC breakfast  . gemfibrozil  600 mg Oral BID AC  . heparin  5,000 Units Subcutaneous Q8H  . lamoTRIgine  50 mg Oral BID  . levothyroxine  100 mcg Oral QAC breakfast  . omega-3 acid ethyl esters  4 g Oral Daily   Continuous Infusions: . sodium chloride 100  mL/hr at 10/02/17 1100  . piperacillin-tazobactam (ZOSYN)  IV 3.375 g (10/03/17 0454)  . vancomycin Stopped (10/03/17 2714)     LOS: 1 day   Jacquelynn Cree  [facility] Pager [phone]. If unable to reach me by pager, please call my cell phone at [phone].  *Please refer to [email or web address], password [email or web address] to get updated schedule on who will round on this patient, as hospitalists switch teams weekly. If 7PM-7AM, please contact night-coverage at [email or web address], password [email or web address] for any overnight needs.  10/03/2017, 8:22 AM

## 2017-10-03 NOTE — Progress Notes (Signed)
RLE venous duplex prelim: negative for DVT.  Makenzie Weisner Eunice, RDMS, RVT  

## 2017-10-04 LAB — CBC
HEMATOCRIT: 33.7 % — AB (ref 39.0–52.0)
HEMOGLOBIN: 10.5 g/dL — AB (ref 13.0–17.0)
MCH: 29.6 pg (ref 26.0–34.0)
MCHC: 31.2 g/dL (ref 30.0–36.0)
MCV: 94.9 fL (ref 78.0–100.0)
Platelets: 146 10*3/uL — ABNORMAL LOW (ref 150–400)
RBC: 3.55 MIL/uL — ABNORMAL LOW (ref 4.22–5.81)
RDW: 14.7 % (ref 11.5–15.5)
WBC: 8.3 10*3/uL (ref 4.0–10.5)

## 2017-10-04 LAB — BASIC METABOLIC PANEL
Anion gap: 7 (ref 5–15)
BUN: 16 mg/dL (ref 6–20)
CHLORIDE: 113 mmol/L — AB (ref 101–111)
CO2: 22 mmol/L (ref 22–32)
CREATININE: 2.1 mg/dL — AB (ref 0.61–1.24)
Calcium: 9.1 mg/dL (ref 8.9–10.3)
GFR calc Af Amer: 37 mL/min — ABNORMAL LOW (ref >60)
GFR calc non Af Amer: 32 mL/min — ABNORMAL LOW (ref >60)
Glucose, Bld: 108 mg/dL — ABNORMAL HIGH (ref 65–99)
Potassium: 4 mmol/L (ref 3.5–5.1)
Sodium: 142 mmol/L (ref 135–145)

## 2017-10-04 LAB — LACTIC ACID, PLASMA: Lactic Acid, Venous: 0.8 mmol/L (ref 0.5–1.9)

## 2017-10-04 MED ORDER — CEPHALEXIN 500 MG PO CAPS: 500.0000 mg | ORAL_CAPSULE | Freq: Four times a day (QID) | ORAL | 0 refills | Status: AC

## 2017-10-04 MED ORDER — CLOZAPINE 25 MG PO TABS: 50.0000 mg | ORAL_TABLET | Freq: Two times a day (BID) | ORAL | Status: DC

## 2017-10-04 MED ORDER — CLOZAPINE 25 MG PO TABS
50.0000 mg | ORAL_TABLET | Freq: Every day | ORAL | Status: DC
  Administered 2017-10-04: 50 mg via ORAL
  Filled 2017-10-04: qty 2

## 2017-10-04 MED ORDER — CLOZAPINE 25 MG PO TABS: 250.0000 mg | ORAL_TABLET | Freq: Every day | ORAL | Status: DC

## 2017-10-04 MED ORDER — SENNA 8.6 MG PO TABS
2.0000 | ORAL_TABLET | Freq: Every day | ORAL | Status: DC
  Administered 2017-10-04: 17.2 mg via ORAL
  Filled 2017-10-04: qty 2

## 2017-10-04 NOTE — Clinical Social Work Note (Signed)
CSW facilitated patient discharge including contacting patient family and facility to confirm patient discharge plans. Clinical information faxed to facility and family agreeable with plan. CSW arranged ambulance transport via [facility] to [facility]. RN has already spoke when the facility.  CSW will sign off for now as social work intervention is no longer needed. Please consult [place] again if new needs arise.  Dayton Scrape, [place]

## 2017-10-04 NOTE — Clinical Social Work Note (Signed)
Clinical Social Work Assessment  Patient Details  Name: [name] MRN: [id number] Date of Birth: 07/31/1951  Date of referral:  10/04/17               Reason for consult:  Facility Placement                Permission sought to share information with:  Facility Sport and exercise psychologist, Guardian Permission granted to share information::  Yes, Verbal Permission Granted  Name::     [name]  Agency::  SNF's  Relationship::  Legal Guardian  Contact Information:  [phone]  Housing/Transportation Living arrangements for the past 2 months:  [place] of Information:  Medical Team, Guardian Patient Interpreter Needed:  None Criminal Activity/Legal Involvement Pertinent to Current Situation/Hospitalization:  No - Comment as needed Significant Relationships:  Other(Comment)(Legal guardian) Lives with:  Facility Resident Do you feel safe going back to the place where you live?  Yes Need for family participation in patient care:  Yes (Comment)  Care giving concerns:  Patient is from the [facility] (Group home). Legal guardian interested in SNF placement for long-term care.   Social Worker assessment / plan:  CSW met with patient. Patient asleep but legal guardian at bedside. CSW introduced role and inquired about needs. Patient's legal guardian, [name], states that she thinks the patient needs a higher level of care than what he is getting. She is interested in long-term SNF placement and mentioned [facility]. She has heard that they can manage patients with the mental health issues that the patient has. CSW explained that he likely would not get a bed today but CSW can send out the referral for placement from home. Patient's legal guardian is agreeable to this. CSW also provided SNF list for review. CSW sent referral to [facility] and notified admissions coordinator of discussion. Patient does not have Medicaid to cover long-term  care and he has not had an inpatient stay to meet Medicare requirements for SNF. No further concerns. CSW encouraged patient's guardian to contact CSW as needed. CSW will continue to follow patient and his guardian for support but plan is for patient to discharge back to group home today.  Employment status:  Disabled (Comment on whether or not currently receiving Disability) Insurance information:  Medicare PT Recommendations:  Not assessed at this time Information / Referral to community resources:  [place]  Patient/Family's Response to care:  Patient's legal guardian agreeable to return to group home today but is interested in looking into SNF placement. Patient's guardian supportive and involved in patient's care. Patient's guardian appreciated social work intervention.  Patient/Family's Understanding of and Emotional Response to Diagnosis, Current Treatment, and Prognosis:  Patient's guardian has a good understanding of the reason for admission and the patient's possible need for a higher level of care than where he lives now. Patient's guardian appears happy with hospital care.  Emotional Assessment Appearance:  Appears stated age Attitude/Demeanor/Rapport:  Lethargic Affect (typically observed):  Unable to Assess(Sleeping) Orientation:  Oriented to Self, Oriented to Place, Oriented to  Time, Oriented to Situation Alcohol / Substance use:  Never Used Psych involvement (Current and /or in the community):  No (Comment)  Discharge Needs  Concerns to be addressed:  Care Coordination Readmission within the last 30 days:  No Current discharge risk:  None Barriers to Discharge:  No Barriers Identified   Candie Chroman, LCSW 10/04/2017, 12:29 PM

## 2017-10-04 NOTE — NC FL2 (Signed)
[place] LEVEL OF CARE SCREENING TOOL     IDENTIFICATION  Patient Name: [name] Birthdate: 1950/12/03 Sex: male Admission Date (Current Location): 10/02/2017  [facility] [place] Number:  Herbalist and Address:  [facility] [place] [place] [address] [place], [place] [address]      Provider Number: [id number]  Attending Physician Name and Address:  Rama, Venetia Maxon, MD  Relative Name and Phone Number:  Gildardo Pounds (Legal Guardian): [phone]    Current Level of Care: Hospital Recommended Level of Care: [place] Prior Approval Number:    Date Approved/Denied:   PASRR Number: [id number]  Discharge Plan: SNF    Current Diagnoses: Patient Active Problem List   Diagnosis Date Noted  . Stage II pressure injury of buttocks 10/03/2017  . Obesity (BMI 30-39.9) 10/03/2017  . Hyperlipidemia 10/03/2017  . Hypothyroidism 10/03/2017  . RBBB 09/17/2017  . Lobar pneumonia ([place]) 02/12/2016  . Cellulitis of right lower extremity 03/28/2015  . Sepsis ([place]) 03/27/2015  . Prolonged QT interval   . CLL (chronic lymphocytic leukemia) ([place]) 09/12/2013  . Non Hodgkin's lymphoma ([place]) 09/12/2013  . Leukocytosis 12/24/2012  . Anemia 12/24/2012  . CKD (chronic kidney disease), stage III ([place]) 12/24/2012  . Schizophrenia ([place]) 12/24/2012    Orientation RESPIRATION BLADDER Height & Weight     Self, Time, Situation, Place  Normal Continent Weight: 290 lb 12.8 oz (131.9 kg) Height:  6\' 1"  (185.4 cm)  BEHAVIORAL SYMPTOMS/MOOD NEUROLOGICAL BOWEL NUTRITION STATUS  (Calm, cooperative, flat affect. Makes inappropriate comments.) (None) Continent Diet(Regular)  AMBULATORY STATUS COMMUNICATION OF NEEDS Skin     Verbally Other (Comment), PU Stage and Appropriate Care(MASD, Rash.)   PU Stage 2 Dressing: (Left buttocks: Foam prn.)                   Personal Care Assistance Level of Assistance              Functional  Limitations Info  Sight, Hearing, Speech Sight Info: Adequate Hearing Info: Adequate Speech Info: Adequate    SPECIAL CARE FACTORS FREQUENCY                       Contractures Contractures Info: Not present    Additional Factors Info  Code Status, Allergies, Psychotropic Code Status Info: Full Allergies Info: Sulfamethoxazole Psychotropic Info: Schizophrenia: Cogentin 0.5 mg PO BID, Clozaril 250 mg PO QHS, Lexapro 10 mg PO daily before breakfast, Lamictal 50 mg PO BID.         Current Medications (10/04/2017):  This is the current hospital active medication list Current Facility-Administered Medications  Medication Dose Route Frequency Provider Last Rate Last Dose  . 0.9 %  sodium chloride infusion   Intravenous Continuous Rondel Jumbo, PA-C 100 mL/hr at 10/04/17 0324    . aspirin EC tablet 81 mg  81 mg Oral Daily Rondel Jumbo, PA-C   81 mg at 10/04/17 1287  . benztropine (COGENTIN) tablet 0.5 mg  0.5 mg Oral BID Rondel Jumbo, PA-C   0.5 mg at 10/04/17 8676  . cholecalciferol (VITAMIN D) tablet 5,000 Units  5,000 Units Oral Daily Heath Lark D, DO   5,000 Units at 10/04/17 0850  . cloZAPine (CLOZARIL) tablet 250 mg  250 mg Oral QHS Rama, Christina P, MD      . cloZAPine (CLOZARIL) tablet 50 mg  50 mg Oral Daily Rama, Venetia Maxon, MD   50 mg  at 10/04/17 1005  . docusate sodium (COLACE) capsule 100 mg  100 mg Oral Daily Rondel Jumbo, PA-C   100 mg at 10/04/17 5374  . escitalopram (LEXAPRO) tablet 10 mg  10 mg Oral QAC breakfast Rondel Jumbo, PA-C   10 mg at 10/04/17 8270  . gemfibrozil (LOPID) tablet 600 mg  600 mg Oral BID AC Rondel Jumbo, PA-C   600 mg at 10/04/17 7867  . heparin injection 5,000 Units  5,000 Units Subcutaneous Q8H Rondel Jumbo, PA-C   5,000 Units at 10/04/17 5449  . lamoTRIgine (LAMICTAL) tablet 50 mg  50 mg Oral BID Rondel Jumbo, PA-C   50 mg at 10/04/17 2010  . levothyroxine (SYNTHROID, LEVOTHROID) tablet 100 mcg  100 mcg Oral QAC  breakfast Rondel Jumbo, PA-C   100 mcg at 10/04/17 0850  . omega-3 acid ethyl esters (LOVAZA) capsule 4 g  4 g Oral Daily Rondel Jumbo, PA-C   4 g at 10/04/17 0712  . piperacillin-tazobactam (ZOSYN) IVPB 3.375 g  3.375 g Intravenous Q8H Janett Billow, RPH   Stopped at 10/04/17 1005  . senna (SENOKOT) tablet 17.2 mg  2 tablet Oral Daily Theodis Blaze, MD   17.2 mg at 10/04/17 0919  . vancomycin (VANCOCIN) 1,500 mg in sodium chloride 0.9 % 500 mL IVPB  1,500 mg Intravenous Q24H Janett Billow, RPH 250 mL/hr at 10/04/17 0450 1,500 mg at 10/04/17 0450     Discharge Medications: Please see discharge summary for a list of discharge medications.  Relevant Imaging Results:  Relevant Lab Results:   Additional Information SS#: [id number]. Lives at the [facility] in [place].  Candie Chroman, LCSW

## 2017-10-04 NOTE — Progress Notes (Signed)
Discharged to [facility], transported by [employer] [facility]. PIV removed no signs of infiltration or swelling noted.

## 2017-10-04 NOTE — Discharge Instructions (Signed)
Antibiotic Medicine, Adult  Antibiotic medicines treat infections caused by a type of germ called bacteria. They work by killing the bacteria that make you sick.  When do I need to take antibiotics?  You often need these medicines to treat bacterial infections, such as:  · A urinary tract infection (UTI).  · Strep throat.  · Meningitis. This affects the spinal cord and brain.  · A bad lung infection.    You may start the medicines while your doctor waits for tests to come back. When the tests come back, your doctor may change or stop your medicine.  When are antibiotics not needed?  You do not need these medicines for most common illnesses, such as:  · A cold.  · The flu.  · A sore throat.    Antibiotics are not always needed for all infections caused by bacteria. Do not ask for these medicines, or take them, when they are not needed.  What are the risks of taking antibiotics?  Most antibiotics can cause an infection called Clostridium difficile.This causes watery poop (diarrhea). Let your doctor know right away if:  · You have watery poop while taking an antibiotic.  · You have watery poop after you stop taking an antibiotic. The illness can happen weeks after you stop the medicine.    You also have a risk of getting an infection in the future that antibiotics cannot treat (antibiotic-resistant infection). This type of infection can be dangerous.  What else should I know about taking antibiotics?  · You need to take the entire prescription.  ? Take the medicine for as long as told by your doctor.  ? Do not stop taking it even if you start to feel better.  · Try not to miss any doses. If you miss a dose, call your doctor.  · Birth control pills may not work. If you take birth control pills:  ? Keep on taking them.  ? Use a second form of birth control, such as a condom. Do this for as long as told by your doctor.  · Ask your doctor:  ? How long to wait in between doses.  ? If you should take the medicine with  food.  ? If there is anything you should stay away from while taking the antibiotic, such as:  ? Food.  ? Drinks.  ? Medicines.  ? If there are any side effects you should watch for.  · Only take the medicines that your doctor told you to take. Do not take medicines that were given to someone else.  · Drink a large glass of water with the medicine.  · Ask the pharmacist for a tool to measure the medicine, such as:  ? A syringe.  ? A cup.  ? A spoon.  · Throw away any extra medicine.  Contact a doctor if:  · You get worse.  · You have new joint pain or muscle aches after starting the medicine.  · You have side effects from the medicine, such as:  ? Stomach pain.  ? Watery poop.  ? Feeling sick to your stomach (nausea).  Get help right away if:  · You have signs of a very bad allergic reaction. If this happens, stop taking the medicine right away. Signs may include:  ? Hives. These are raised, itchy, red bumps on the skin.  ? Skin rash.  ? Trouble breathing.  ? Wheezing.  ? Swelling.  ? Feeling dizzy.  ? Throwing up (  vomiting).  · Your pee (urine) is dark, or is the color of blood.  · Your skin turns yellow.  · You bruise easily.  · You bleed easily.  · You have very bad watery poop and cramps in your belly.  · You have a very bad headache.  Summary  · Antibiotics are often used to treat infections caused by bacteria.  · Only take these medicines when needed.  · Let your doctor know if you have watery poop while taking an antibiotic.  · You need to take the entire prescription.  This information is not intended to replace advice given to you by your health care provider. Make sure you discuss any questions you have with your health care provider.  Document Released: 08/11/2008 Document Revised: 11/04/2016 Document Reviewed: 11/04/2016  Elsevier Interactive Patient Education © 2017 [employer]

## 2017-10-04 NOTE — Clinical Social Work Note (Signed)
Per staff at group home, patient will need [facility] to bring him back to the facility. They are requesting his discharge summary as well. RN updated. Will set up transport once discharge summary is in.  Dayton Scrape, [place]

## 2017-10-04 NOTE — Discharge Summary (Signed)
Physician Discharge Summary  [name] TMH:[id number] DOB: [birth year] DOA: 10/02/2017  PCP: Janeth Rase, MD  Admit date: 10/02/2017 Discharge date: 10/04/2017  Admitted From: ALF Discharge disposition: ALF   Recommendations for Outpatient Follow-Up:   1. F/U with PCP in 1 week. Please check final blood culture results at that visit.   Discharge Diagnosis:   Principal Problem:   Sepsis ([place]), resolved Active Problems:   Leukocytosis   Anemia   CKD (chronic kidney disease), stage III (HCC)   Schizophrenia (HCC)   CLL (chronic lymphocytic leukemia) (HCC)   Non Hodgkin's lymphoma (HCC)   Prolonged QT interval   Cellulitis of right lower extremity   Lobar pneumonia (HCC)   RBBB   Stage II pressure injury of buttocks   Obesity (BMI 30-39.9)   Hyperlipidemia   Hypothyroidism  Discharge Condition: Improved.  Diet recommendation: Low sodium, heart healthy.    History of Present Illness:   [name] is an 66 y.o. male with a PMH of NHL, CLL, schizophrenia (from an ALF), hyperlipidemia, hypertension and chronic kidney disease who was admitted 10/02/17 with fever/sepsis from cellulitis versus pneumonia. Empirically placed on vancomycin/Zosyn.  Hospital Course by Problem:   Principal Problem:   Sepsis ([place]) secondary to cellulitis of the RLE versus left lower lobar pneumonia Tachycardic, tachypneic with elevated lactate, Pro calcitonin and WBC with history fever noted on admission. Fluid volume resuscitated in the ED and placed on empiric broad-spectrum antibiotics with vancomycin and Zosyn. Chest x-ray showed possible left lower lobe pneumonia and the patient has evidence of cellulitis of the RLE. Respiratory virus panel negative. Cultures negative to date. Lactate cleared.  Appears well/non-toxic. Will d/c back to ALF on 6 days of treatment with Keflex.  Active Problems:   Anemia Drop in hemoglobin to 10.5 likely dilutional in the setting of fluid volume  resuscitation.    CKD (chronic kidney disease), stage III (HCC) Baseline creatinine 2-2.5. Current creatinine consistent with usual baseline values.    Schizophrenia ([place]) Continue Lamictal, Thorazine, Clozapine, Lexapro and Cogentin.     H/O CLL (chronic lymphocytic leukemia) (HCC)/Non Hodgkin's lymphoma ([place]) He has a history of large B-cell non-hodgkin's lymphoma involving the right groin diagnosed in October 2009. In addition, he has a history of chronic lymphocytic leukemia diagnosed in December 2004.Completed a total of 7 cycles of multi-agent chemotherapy from late November 2009 through late March 2010.     Prolonged QT interval/right bundle branch block Benefits of antipsychotics outweigh the risks of arrhythmia given his history of severe paranoid schizophrenia.    Stage II Pressure injury of skin of buttocks Wound care per nursing.    Hyperlipidemia Continue homeLopid, Lovaza, Zetia.    Hypothyroidism Continue Synthroid.    Obesity Body mass index is 38.37 kg/m.  Medical Consultants:    None.   Discharge Exam:   Vitals:   10/04/17 0029 10/04/17 0542  BP: (!) 101/56 128/70  Pulse: 86 84  Resp: 18 18  Temp: 98.5 F (36.9 C) (!) 97.5 F (36.4 C)  SpO2: 95% 97%   Vitals:   10/03/17 0807 10/03/17 2043 10/04/17 0029 10/04/17 0542  BP: (!) 120/58 118/63 (!) 101/56 128/70  Pulse: 88 87 86 84  Resp: 18 20 18 18   Temp: 99.1 F (37.3 C) 98.2 F (36.8 C) 98.5 F (36.9 C) (!) 97.5 F (36.4 C)  TempSrc: Oral Oral Oral Oral  SpO2: 97% 94% 95% 97%  Weight:    131.9 kg (290 lb 12.8 oz)  Height:  General exam: Appears calm and comfortable.  Respiratory system: Clear to auscultation. Respiratory effort normal. Cardiovascular system: S1 & S2 heard, RRR. No JVD,  rubs, gallops or clicks. No murmurs. Gastrointestinal system: Abdomen is nondistended, soft and nontender. No organomegaly or masses felt. Normal bowel sounds heard. Central nervous  system:  No focal neurological deficits. Extremities: No clubbing, or cyanosis. No edema. Skin: No rashes, lesions or ulcers. Psychiatry: Judgement and insight appear very impaired. Mood & affect inappropriate. Making sexually suggestive comments to male staff.  The results of significant diagnostics from this hospitalization (including imaging, microbiology, ancillary and laboratory) are listed below for reference.     Procedures and Diagnostic Studies:   Dg Chest 2 View  Result Date: 10/02/2017 CLINICAL DATA:  66 year old male with sepsis. EXAM: CHEST  2 VIEW COMPARISON:  Chest radiograph dated 06/04/2016 FINDINGS: There is shallow inspiration. An area of increased density at the left lung base likely atelectatic changes although pneumonia is not excluded. Small left pleural effusion may be present. No pneumothorax. Stable cardiac silhouette. No acute osseous pathology. IMPRESSION: Shallow inspiration with left lung base atelectatic changes versus infiltrate. Clinical correlation is recommended. Electronically Signed   By: Anner Crete M.D.   On: 10/02/2017 02:59   Dg Chest Port 1 View  Result Date: 10/03/2017 CLINICAL DATA:  Sepsis.  COPD and congestive heart failure. EXAM: PORTABLE CHEST 1 VIEW COMPARISON:  Two-view chest x-ray 10/02/2017. FINDINGS: Heart size is exaggerated by low lung volumes. Left greater than right basilar airspace opacities are stable. There is no edema. Small left effusion is suspected. IMPRESSION: 1. Stable bibasilar airspace disease, left greater than right. While this may reflect atelectasis, infection is not excluded. 2. Persistent low lung volumes. Electronically Signed   By: San Morelle M.D.   On: 10/03/2017 08:32     Labs:   Basic Metabolic Panel: Recent Labs  Lab 10/02/17 0051 10/04/17 0522  NA 139 142  K 4.2 4.0  CL 107 113*  CO2 20* 22  GLUCOSE 160* 108*  BUN 22* 16  CREATININE 2.37* 2.10*  CALCIUM 9.4 9.1   GFR Estimated  Creatinine Clearance: 50.6 mL/min (A) (by C-G formula based on SCr of 2.1 mg/dL (H)). Liver Function Tests: Recent Labs  Lab 10/02/17 0051  AST 23  ALT 13*  ALKPHOS 113  BILITOT 0.5  PROT 6.4*  ALBUMIN 4.0    CBC: Recent Labs  Lab 10/02/17 0051 10/04/17 0522  WBC 14.8* 8.3  NEUTROABS 13.5*  --   HGB 12.8* 10.5*  HCT 38.8* 33.7*  MCV 95.1 94.9  PLT 149* 146*   Microbiology Recent Results (from the past 240 hour(s))  Blood Culture (routine x 2)     Status: None (Preliminary result)   Collection Time: 10/02/17  4:00 AM  Result Value Ref Range Status   Specimen Description BLOOD RIGHT ANTECUBITAL  Final   Special Requests   Final    IN BOTH AEROBIC AND ANAEROBIC BOTTLES Blood Culture adequate volume   Culture NO GROWTH 2 DAYS  Final   Report Status PENDING  Incomplete  Blood Culture (routine x 2)     Status: None (Preliminary result)   Collection Time: 10/02/17  4:00 AM  Result Value Ref Range Status   Specimen Description BLOOD LEFT ANTECUBITAL  Final   Special Requests   Final    IN BOTH AEROBIC AND ANAEROBIC BOTTLES Blood Culture adequate volume   Culture NO GROWTH 2 DAYS  Final   Report Status PENDING  Incomplete  Urine culture     Status: None   Collection Time: 10/02/17 11:16 AM  Result Value Ref Range Status   Specimen Description URINE, CLEAN CATCH  Final   Special Requests NONE  Final   Culture NO GROWTH  Final   Report Status 10/03/2017 FINAL  Final  Respiratory Panel by PCR     Status: None   Collection Time: 10/02/17  4:28 PM  Result Value Ref Range Status   Adenovirus NOT DETECTED NOT DETECTED Final   Coronavirus 229E NOT DETECTED NOT DETECTED Final   Coronavirus HKU1 NOT DETECTED NOT DETECTED Final   Coronavirus NL63 NOT DETECTED NOT DETECTED Final   Coronavirus OC43 NOT DETECTED NOT DETECTED Final   Metapneumovirus NOT DETECTED NOT DETECTED Final   Rhinovirus / Enterovirus NOT DETECTED NOT DETECTED Final   Influenza A NOT DETECTED NOT DETECTED  Final   Influenza B NOT DETECTED NOT DETECTED Final   Parainfluenza Virus 1 NOT DETECTED NOT DETECTED Final   Parainfluenza Virus 2 NOT DETECTED NOT DETECTED Final   Parainfluenza Virus 3 NOT DETECTED NOT DETECTED Final   Parainfluenza Virus 4 NOT DETECTED NOT DETECTED Final   Respiratory Syncytial Virus NOT DETECTED NOT DETECTED Final   Bordetella pertussis NOT DETECTED NOT DETECTED Final   Chlamydophila pneumoniae NOT DETECTED NOT DETECTED Final   Mycoplasma pneumoniae NOT DETECTED NOT DETECTED Final     Discharge Instructions:   Discharge Instructions    Call MD for:  difficulty breathing, headache or visual disturbances   Complete by:  As directed    Call MD for:  extreme fatigue   Complete by:  As directed    Call MD for:  persistant dizziness or light-headedness   Complete by:  As directed    Call MD for:  temperature >100.4   Complete by:  As directed    Diet - low sodium heart healthy   Complete by:  As directed    Increase activity slowly   Complete by:  As directed      Allergies as of 10/04/2017      Reactions   Sulfamethoxazole Rash      Medication List    STOP taking these medications   lithium carbonate 300 MG capsule     TAKE these medications   aspirin EC 81 MG tablet Take 81 mg by mouth daily.   benztropine 0.5 MG tablet Commonly known as:  COGENTIN Take 0.5 mg by mouth 2 (two) times daily.   cephALEXin 500 MG capsule Commonly known as:  KEFLEX Take 1 capsule (500 mg total) 4 (four) times daily for 6 days by mouth.   chlorproMAZINE 50 MG tablet Commonly known as:  THORAZINE Take 50 mg 4 (four) times daily by mouth.   chlorproMAZINE 100 MG tablet Commonly known as:  THORAZINE Take 100 mg at bedtime by mouth.   clotrimazole 1 % cream Commonly known as:  LOTRIMIN Apply 1 application topically 2 (two) times daily.   cloZAPine 100 MG tablet Commonly known as:  CLOZARIL Take 200 mg by mouth at bedtime.   clozapine 50 MG tablet Commonly  known as:  CLOZARIL Take 50 mg 3 (three) times daily by mouth.   docusate sodium 100 MG capsule Commonly known as:  COLACE Take 100 mg by mouth daily.   escitalopram 10 MG tablet Commonly known as:  LEXAPRO Take 10 mg by mouth daily before breakfast.   ezetimibe 10 MG tablet Commonly known as:  ZETIA Take 10 mg by mouth daily.  Fish Oil 1000 MG Caps Take 4,000 mg by mouth daily.   gemfibrozil 600 MG tablet Commonly known as:  LOPID Take 600 mg 2 (two) times daily by mouth.   lamoTRIgine 25 MG tablet Commonly known as:  LAMICTAL Take 50 mg by mouth 2 (two) times daily.   levothyroxine 100 MCG tablet Commonly known as:  SYNTHROID, LEVOTHROID Take 100 mcg daily by mouth.   mineral oil liquid Place into the right ear once a week. Instill 1-2 drops into the right ear once weekly.   Nystatin Powd Apply 1 application 3 (three) times daily as needed topically (Rash).   polyethylene glycol packet Commonly known as:  MIRALAX / GLYCOLAX Take 17 g daily by mouth. Dissolve 17g in 8oz of water/juice and drink by mouth every day   senna 8.6 MG tablet Commonly known as:  SENOKOT Take 1 tablet by mouth daily.   VITAMIN D-3 PO Take 5,000 mg by mouth daily.         Time coordinating discharge: 35 minutes.  SignedMargreta Journey Commodore Bellew  Pager [phone] [facility] 10/04/2017, 1:52 PM

## 2017-10-06 ENCOUNTER — Ambulatory Visit (HOSPITAL_COMMUNITY): Payer: Medicare Other | Attending: Internal Medicine

## 2017-10-06 ENCOUNTER — Other Ambulatory Visit: Payer: Self-pay

## 2017-10-06 DIAGNOSIS — F259 Schizoaffective disorder, unspecified: Secondary | ICD-10-CM | POA: Diagnosis not present

## 2017-10-06 DIAGNOSIS — M7989 Other specified soft tissue disorders: Secondary | ICD-10-CM | POA: Insufficient documentation

## 2017-10-06 DIAGNOSIS — Z8572 Personal history of non-Hodgkin lymphomas: Secondary | ICD-10-CM | POA: Insufficient documentation

## 2017-10-06 DIAGNOSIS — N189 Chronic kidney disease, unspecified: Secondary | ICD-10-CM | POA: Insufficient documentation

## 2017-10-06 DIAGNOSIS — R0602 Shortness of breath: Secondary | ICD-10-CM | POA: Diagnosis not present

## 2017-10-07 LAB — CULTURE, BLOOD (ROUTINE X 2)
CULTURE: NO GROWTH
Culture: NO GROWTH

## 2017-10-08 ENCOUNTER — Institutional Professional Consult (permissible substitution): Payer: Self-pay | Admitting: Internal Medicine

## 2017-10-11 ENCOUNTER — Inpatient Hospital Stay (HOSPITAL_COMMUNITY): Admission: RE | Admit: 2017-10-11 | Payer: Medicare Other | Source: Ambulatory Visit

## 2017-10-12 ENCOUNTER — Ambulatory Visit (HOSPITAL_COMMUNITY)
Admission: RE | Admit: 2017-10-12 | Discharge: 2017-10-12 | Disposition: A | Discharge status: Home / Self Care | Payer: Medicare Other | Source: Ambulatory Visit | Attending: Cardiovascular Disease | Admitting: Cardiovascular Disease

## 2017-10-12 DIAGNOSIS — R0602 Shortness of breath: Secondary | ICD-10-CM | POA: Insufficient documentation

## 2017-10-12 DIAGNOSIS — M7989 Other specified soft tissue disorders: Secondary | ICD-10-CM

## 2017-10-12 MED ORDER — TECHNETIUM TO 99M ALBUMIN AGGREGATED
4.0000 | Freq: Once | INTRAVENOUS | Status: AC | PRN
  Administered 2017-10-12: 4 via INTRAVENOUS

## 2017-10-12 MED ORDER — TECHNETIUM TC 99M DIETHYLENETRIAME-PENTAACETIC ACID
32.1000 | Freq: Once | INTRAVENOUS | Status: AC | PRN
  Administered 2017-10-12: 32.1 via INTRAVENOUS

## 2017-10-19 ENCOUNTER — Ambulatory Visit (INDEPENDENT_AMBULATORY_CARE_PROVIDER_SITE_OTHER): Payer: Medicare Other | Admitting: Cardiovascular Disease

## 2017-10-19 ENCOUNTER — Encounter: Payer: Self-pay | Admitting: Cardiovascular Disease

## 2017-10-19 VITALS — BP 118/68 | HR 110 | Ht 73.0 in | Wt 284.0 lb

## 2017-10-19 DIAGNOSIS — R9431 Abnormal electrocardiogram [ECG] [EKG]: Secondary | ICD-10-CM

## 2017-10-19 DIAGNOSIS — R0602 Shortness of breath: Secondary | ICD-10-CM | POA: Diagnosis not present

## 2017-10-19 DIAGNOSIS — I517 Cardiomegaly: Secondary | ICD-10-CM | POA: Diagnosis not present

## 2017-10-19 DIAGNOSIS — I4510 Unspecified right bundle-branch block: Secondary | ICD-10-CM | POA: Diagnosis not present

## 2017-10-19 NOTE — Patient Instructions (Signed)
Medication Instructions:  Your physician recommends that you continue on your current medications as directed. Please refer to the Current Medication list given to you today.  Labwork: none  Testing/Procedures: none  Follow-Up: Your physician wants you to follow-up in: 6 month ov You will receive a reminder letter in the mail two months in advance. If you don't receive a letter, please call our office to schedule the follow-up appointment.  Any Other Special Instructions Will Be Listed Below (If Applicable). YOU NEED TO WALK TWICE A DAY  WORK ON TRYING TO LOSE 10 POUNDS BEFORE YOUR FOLLOW UP    If you need a refill on your cardiac medications before your next appointment, please call your pharmacy.

## 2017-10-19 NOTE — Progress Notes (Signed)
Cardiology Office Note   Date:  10/21/2017   ID:  [name], DOB 1951-04-03, MRN [id number]  PCP:  [name], [place]  Cardiologist:   Skeet Latch, MD   No chief complaint on file.    History of Present Illness: [name] is a 66 y.o. male with hypertension, hyperlipidemia, long QT, CKD III, CLL, and schizophrenia who presents for follow up.  He was initially seen 09/14/17 for an evaluation of abnormal EKG.  [name] had a routine EKG for evaluation prior to taking antipsychotic medication.  He was noted to have a right bundle branch block and was referred to cardiology for evaluation.  He was treated for pneumonia 02/2017.  He does not provide much history but is accompanied by his guardian.  She notes that he has increasing exertional dyspnea.  He was referred for an echo 10/06/17 that showed LVEF 65-70% with severe LVH and grade 1 diastolic dysfunction.  There is no evidence of left ventricular outflow tract obstruction or volume overload.  He also had a V/Q scan that was negative for pulmonary embolism.  Since his last appointment [name] was admitted to the hospital 09/2016 with pneumonia, cellulitis and sepsis.  Since being discharged he has been feeling well.  His only complaint is pain in his buttocks.  He continues to be short of breath with exertion, Though his guardian mentions that he does not exert himself much anymore.  Basically sits at the group home all day long and does not do anything.  She is concerned that he needs a higher level of care.  He does not get much attention at the group home and they do not force him to exercise.  He perseverates on the fact that he is no longer allowed to himself.  He wants to walk to the store and is not permitted to do so.  Therefore he chooses not to walk at all.  He has no chest pain with exertion he just gets short of breath.  He has mild right lower extremity edema that persist after his treatment for cellulitis.   He denies orthopnea or PND.  Noted any palpitations, lightheadedness, or dizziness.  He does mention that "[facility] wrote me 4 times before she became Secretary of State, and that he needs to write her back."  He denies SI or HI.    Past Medical History:  Diagnosis Date  . Cellulitis   . CHF (congestive heart failure) ([place])   . Chronic kidney disease 09/20/2008  . Chronic lymphocytic leukemia ([place])   . cll dx'd 10/2003   no rx  . CLL (chronic lymphoblastic leukemia) 10/2003  . COPD (chronic obstructive pulmonary disease) ([place])   . GERD (gastroesophageal reflux disease)   . Hiatal hernia   . Hyperlipemia   . Hypertension   . Lower extremity edema   . NHL (non-Hodgkin's lymphoma) ([place]) dx'd 08/2008 rt groin   chemo comp 08/2008  . Non Hodgkin's lymphoma ([place]) 08/2008  . Non Hodgkin's lymphoma ([place])   . Paranoid schizophrenia ([place])   . Prolonged Q-T interval on ECG   . RBBB 09/17/2017  . Schizoaffective disorder ([place])   . Sepsis ([place])   . Severe left ventricular hypertrophy 10/21/2017  . Vitamin D deficiency     History reviewed. No pertinent surgical history.   Current Outpatient Medications  Medication Sig Dispense Refill  . aspirin EC 81 MG tablet Take 81 mg by mouth daily.    . chlorproMAZINE (  THORAZINE) 100 MG tablet Take 100 mg at bedtime by mouth.     . chlorproMAZINE (THORAZINE) 50 MG tablet Take 50 mg 4 (four) times daily by mouth.     . Cholecalciferol (VITAMIN D-3 PO) Take 5,000 mg by mouth daily.    . clotrimazole (LOTRIMIN) 1 % cream Apply 1 application topically 2 (two) times daily.    . cloZAPine (CLOZARIL) 100 MG tablet Take 200 mg by mouth at bedtime.     . clozapine (CLOZARIL) 50 MG tablet Take 50 mg 3 (three) times daily by mouth.     . docusate sodium (COLACE) 100 MG capsule Take 100 mg by mouth daily.    Marland Kitchen escitalopram (LEXAPRO) 10 MG tablet Take 10 mg by mouth daily before breakfast.     . gemfibrozil (LOPID) 600 MG tablet Take 600 mg 2 (two) times  daily by mouth.     . lamoTRIgine (LAMICTAL) 25 MG tablet Take 50 mg by mouth 2 (two) times daily.     Marland Kitchen levothyroxine (SYNTHROID, LEVOTHROID) 100 MCG tablet Take 100 mcg daily by mouth.     . Nystatin POWD Apply 1 application 3 (three) times daily as needed topically (Rash).    . Omega-3 Fatty Acids (FISH OIL) 1000 MG CAPS Take 4,000 mg by mouth daily.    . polyethylene glycol (MIRALAX / GLYCOLAX) packet Take 17 g daily by mouth. Dissolve 17g in 8oz of water/juice and drink by mouth every day    . senna (SENOKOT) 8.6 MG tablet Take 1 tablet by mouth daily.     No current facility-administered medications for this visit.     Allergies:   Sulfamethoxazole    Social History:  The patient  reports that  has never smoked. he has never used smokeless tobacco. He reports that he does not drink alcohol or use drugs.   Family History:  The patient's family history includes Alcohol abuse in his father.    ROS:  Please see the history of present illness.   Otherwise, review of systems are positive for none.   All other systems are reviewed and negative.    PHYSICAL EXAM: VS:  BP 118/68   Pulse (!) 110   Ht 6\' 1"  (1.854 m)   Wt 284 lb (128.8 kg)   BMI 37.47 kg/m   , BMI Body mass index is 37.47 kg/m. GENERAL:  Well appearing.  No acute distress HEENT: Pupils equal round and reactive, fundi not visualized, oral mucosa unremarkable NECK:  No jugular venous distention, waveform within normal limits, carotid upstroke brisk and symmetric, no bruits, no thyromegaly LUNGS:  Clear to auscultation bilaterally HEART:  Tachycardic.  Regular rhythm.  PMI not displaced or sustained,S1 and S2 within normal limits, no S3, no S4, no clicks, no rubs, no murmurs ABD:  Central adiposity.  Positive bowel sounds normal in frequency in pitch, no bruits, no rebound, no guarding, no midline pulsatile mass, no hepatomegaly, no splenomegaly EXT:  2 plus pulses throughout, 1+ R LE edema and mild erythma, no cyanosis  no clubbing SKIN:  No rashes no nodules NEURO:  Cranial nerves II through XII grossly intact, motor grossly intact throughout PSYCH:  Cognitively intact, oriented to person place and time   EKG:  EKG is not ordered today. The ekg ordered 09/14/17 demonstrates sinus tachycardia.  Rate 104 bpm.  LAFB.  RBBB.  QTc 504 ms.    Echo 10/06/17: Study Conclusions  - Left ventricle: The cavity size was normal. There was severe  concentric hypertrophy. Systolic function was vigorous. The   estimated ejection fraction was in the range of 65% to 70%. Wall   motion was normal; there were no regional wall motion   abnormalities. Doppler parameters are consistent with abnormal   left ventricular relaxation (grade 1 diastolic dysfunction). The   E/e&' ratio is <8, suggesting normal LV filling pressure. - Left atrium: The atrium was normal in size. - Inferior vena cava: The vessel was normal in size. The   respirophasic diameter changes were in the normal range (>= 50%),   consistent with normal central venous pressure.  Impressions:  - LVEF 65-70%, severe concentric LVH, normal wall motion, grade 1   DD, normal LV filling pressure, normal LA size, normal IVC.   Recent Labs: 09/14/2017: NT-Pro BNP 57 10/02/2017: ALT 13 10/04/2017: BUN 16; Creatinine, Ser 2.10; Hemoglobin 10.5; Platelets 146; Potassium 4.0; Sodium 142   03/02/17: Sodium 144, potassium 4.1, BUN 23, creatinine 1.89   Lipid Panel    Component Value Date/Time   CHOL  11/13/2009 0655    158        ATP III CLASSIFICATION:  <200     mg/dL   Desirable  200-239  mg/dL   Borderline High  >=240    mg/dL   High          TRIG 267 (H) 11/13/2009 0655   HDL 38 (L) 11/13/2009 0655   CHOLHDL 4.2 11/13/2009 0655   VLDL 53 (H) 11/13/2009 0655   LDLCALC  11/13/2009 0655    67        Total Cholesterol/HDL:CHD Risk Coronary Heart Disease Risk Table                     Men   Women  1/2 Average Risk   3.4   3.3  Average Risk        5.0   4.4  2 X Average Risk   9.6   7.1  3 X Average Risk  23.4   11.0        Use the calculated Patient Ratio above and the CHD Risk Table to determine the patient's CHD Risk.        ATP III CLASSIFICATION (LDL):  <100     mg/dL   Optimal  100-129  mg/dL   Near or Above                    Optimal  130-159  mg/dL   Borderline  160-189  mg/dL   High  >190     mg/dL   Very High      Wt Readings from Last 3 Encounters:  10/19/17 284 lb (128.8 kg)  10/04/17 290 lb 12.8 oz (131.9 kg)  09/14/17 288 lb 3.2 oz (130.7 kg)      ASSESSMENT AND PLAN:  # QTc Prolongation: # RBBB: [name] QTc is mildly prolonged at 504 ms.  It has been longer in the past.  This is likely due to several of his antipsychotic medications (chlorpromazine, clozapine, and lexapro).  Avoid adding any additional QT prolonging medications.  RBBB is chronic and not clinically significant.  Repeat EKG every 6 months for QT monitoring.    # Severe LVH: Cause unknown.  He does not have hypertension. There was no evidence of LVOT obstruction.  He is not a candidate for cardiac MRI due to renal dysfunction.  This could represent hypertrophic cardiomyopathy.  Having him wear a monitor for NSVT  detection would be a challenge.  Consider nuclear study for detection of amyloid.    # Shortness of breath: I suspect that this is due to deconditioning.  I recommended that he start walking around the house twice daily until he can build himself up to walking for 30 minutes at a time.  IVC was small and collapsible, so we will not start furosemide.   Current medicines are reviewed at length with the patient today.  The patient does not have concerns regarding medicines.  The following changes have been made:  no change  Labs/ tests ordered today include:   No orders of the defined types were placed in this encounter.    Disposition:   FU with Todd Banh C. Oval Linsey, MD, [facility] in 6 months.   This note was written with the  assistance of speech recognition software.  Please excuse any transcriptional errors.  Signed, Shandria Clinch C. Oval Linsey, MD, [facility]  10/21/2017 9:21 AM    [place]

## 2017-10-21 ENCOUNTER — Encounter: Payer: Self-pay | Admitting: Cardiovascular Disease

## 2017-10-21 DIAGNOSIS — I517 Cardiomegaly: Secondary | ICD-10-CM

## 2017-10-21 HISTORY — DX: Cardiomegaly: I51.7

## 2017-12-02 ENCOUNTER — Other Ambulatory Visit: Payer: Self-pay

## 2017-12-02 ENCOUNTER — Inpatient Hospital Stay (HOSPITAL_COMMUNITY)
Admission: EM | Admit: 2017-12-02 | Discharge: 2017-12-06 | DRG: 871 | Disposition: A | Discharge status: Home / Self Care | Payer: Medicare Other | Attending: Internal Medicine | Admitting: Internal Medicine

## 2017-12-02 ENCOUNTER — Emergency Department (HOSPITAL_COMMUNITY): Payer: Medicare Other

## 2017-12-02 DIAGNOSIS — L03115 Cellulitis of right lower limb: Secondary | ICD-10-CM | POA: Diagnosis present

## 2017-12-02 DIAGNOSIS — L89322 Pressure ulcer of left buttock, stage 2: Secondary | ICD-10-CM | POA: Diagnosis present

## 2017-12-02 DIAGNOSIS — Z856 Personal history of leukemia: Secondary | ICD-10-CM | POA: Diagnosis not present

## 2017-12-02 DIAGNOSIS — E7800 Pure hypercholesterolemia, unspecified: Secondary | ICD-10-CM

## 2017-12-02 DIAGNOSIS — Z7982 Long term (current) use of aspirin: Secondary | ICD-10-CM | POA: Diagnosis not present

## 2017-12-02 DIAGNOSIS — K219 Gastro-esophageal reflux disease without esophagitis: Secondary | ICD-10-CM | POA: Diagnosis present

## 2017-12-02 DIAGNOSIS — L89312 Pressure ulcer of right buttock, stage 2: Secondary | ICD-10-CM | POA: Diagnosis present

## 2017-12-02 DIAGNOSIS — R7989 Other specified abnormal findings of blood chemistry: Secondary | ICD-10-CM | POA: Diagnosis present

## 2017-12-02 DIAGNOSIS — E785 Hyperlipidemia, unspecified: Secondary | ICD-10-CM | POA: Diagnosis present

## 2017-12-02 DIAGNOSIS — Y95 Nosocomial condition: Secondary | ICD-10-CM | POA: Diagnosis present

## 2017-12-02 DIAGNOSIS — J189 Pneumonia, unspecified organism: Secondary | ICD-10-CM | POA: Diagnosis not present

## 2017-12-02 DIAGNOSIS — F259 Schizoaffective disorder, unspecified: Secondary | ICD-10-CM | POA: Diagnosis present

## 2017-12-02 DIAGNOSIS — I509 Heart failure, unspecified: Secondary | ICD-10-CM | POA: Diagnosis present

## 2017-12-02 DIAGNOSIS — Z79899 Other long term (current) drug therapy: Secondary | ICD-10-CM

## 2017-12-02 DIAGNOSIS — J440 Chronic obstructive pulmonary disease with acute lower respiratory infection: Secondary | ICD-10-CM | POA: Diagnosis present

## 2017-12-02 DIAGNOSIS — I130 Hypertensive heart and chronic kidney disease with heart failure and stage 1 through stage 4 chronic kidney disease, or unspecified chronic kidney disease: Secondary | ICD-10-CM | POA: Diagnosis present

## 2017-12-02 DIAGNOSIS — E039 Hypothyroidism, unspecified: Secondary | ICD-10-CM | POA: Diagnosis present

## 2017-12-02 DIAGNOSIS — Z6839 Body mass index (BMI) 39.0-39.9, adult: Secondary | ICD-10-CM

## 2017-12-02 DIAGNOSIS — Z882 Allergy status to sulfonamides status: Secondary | ICD-10-CM

## 2017-12-02 DIAGNOSIS — D72829 Elevated white blood cell count, unspecified: Secondary | ICD-10-CM | POA: Diagnosis not present

## 2017-12-02 DIAGNOSIS — Z8572 Personal history of non-Hodgkin lymphomas: Secondary | ICD-10-CM

## 2017-12-02 DIAGNOSIS — J181 Lobar pneumonia, unspecified organism: Secondary | ICD-10-CM | POA: Diagnosis present

## 2017-12-02 DIAGNOSIS — A419 Sepsis, unspecified organism: Secondary | ICD-10-CM | POA: Diagnosis present

## 2017-12-02 DIAGNOSIS — N183 Chronic kidney disease, stage 3 (moderate): Secondary | ICD-10-CM | POA: Diagnosis present

## 2017-12-02 DIAGNOSIS — R945 Abnormal results of liver function studies: Secondary | ICD-10-CM | POA: Diagnosis not present

## 2017-12-02 DIAGNOSIS — L0390 Cellulitis, unspecified: Secondary | ICD-10-CM | POA: Diagnosis present

## 2017-12-02 DIAGNOSIS — M7989 Other specified soft tissue disorders: Secondary | ICD-10-CM | POA: Diagnosis not present

## 2017-12-02 LAB — CBC WITH DIFFERENTIAL/PLATELET
Basophils Absolute: 0 10*3/uL (ref 0.0–0.1)
Basophils Relative: 0 %
Eosinophils Absolute: 0 10*3/uL (ref 0.0–0.7)
Eosinophils Relative: 0 %
HCT: 41.9 % (ref 39.0–52.0)
Hemoglobin: 14.1 g/dL (ref 13.0–17.0)
Lymphocytes Relative: 4 %
Lymphs Abs: 0.9 10*3/uL (ref 0.7–4.0)
MCH: 31.5 pg (ref 26.0–34.0)
MCHC: 33.7 g/dL (ref 30.0–36.0)
MCV: 93.7 fL (ref 78.0–100.0)
Monocytes Absolute: 1.3 10*3/uL — ABNORMAL HIGH (ref 0.1–1.0)
Monocytes Relative: 6 %
Neutro Abs: 20 10*3/uL — ABNORMAL HIGH (ref 1.7–7.7)
Neutrophils Relative %: 90 %
Platelets: 158 10*3/uL (ref 150–400)
RBC: 4.47 MIL/uL (ref 4.22–5.81)
RDW: 14.2 % (ref 11.5–15.5)
WBC: 22.2 10*3/uL — ABNORMAL HIGH (ref 4.0–10.5)

## 2017-12-02 LAB — INFLUENZA PANEL BY PCR (TYPE A & B)
Influenza A By PCR: NEGATIVE
Influenza B By PCR: NEGATIVE

## 2017-12-02 LAB — COMPREHENSIVE METABOLIC PANEL
ALT: 24 U/L (ref 17–63)
AST: 42 U/L — ABNORMAL HIGH (ref 15–41)
Albumin: 4.4 g/dL (ref 3.5–5.0)
Alkaline Phosphatase: 93 U/L (ref 38–126)
Anion gap: 11 (ref 5–15)
BUN: 19 mg/dL (ref 6–20)
CO2: 24 mmol/L (ref 22–32)
Calcium: 9.6 mg/dL (ref 8.9–10.3)
Chloride: 105 mmol/L (ref 101–111)
Creatinine, Ser: 2.02 mg/dL — ABNORMAL HIGH (ref 0.61–1.24)
GFR calc Af Amer: 38 mL/min — ABNORMAL LOW (ref >60)
GFR calc non Af Amer: 33 mL/min — ABNORMAL LOW (ref >60)
Glucose, Bld: 132 mg/dL — ABNORMAL HIGH (ref 65–99)
Potassium: 4.7 mmol/L (ref 3.5–5.1)
Sodium: 140 mmol/L (ref 135–145)
Total Bilirubin: 1 mg/dL (ref 0.3–1.2)
Total Protein: 7.1 g/dL (ref 6.5–8.1)

## 2017-12-02 LAB — I-STAT CG4 LACTIC ACID, ED
Lactic Acid, Venous: 3.63 mmol/L (ref 0.5–1.9)
Lactic Acid, Venous: 2.45 mmol/L (ref 0.5–1.9)

## 2017-12-02 LAB — CBG MONITORING, ED: GLUCOSE-CAPILLARY: 140 mg/dL — AB (ref 65–99)

## 2017-12-02 MED ORDER — LEVOTHYROXINE SODIUM 100 MCG PO TABS
100.0000 ug | ORAL_TABLET | Freq: Every day | ORAL | Status: DC
  Administered 2017-12-03 – 2017-12-06 (×4): 100 ug via ORAL
  Filled 2017-12-02 (×6): qty 1

## 2017-12-02 MED ORDER — SODIUM CHLORIDE 0.9 % IV BOLUS (SEPSIS)
1000.0000 mL | Freq: Once | INTRAVENOUS | Status: AC
  Administered 2017-12-02: 1000 mL via INTRAVENOUS

## 2017-12-02 MED ORDER — ACETAMINOPHEN 325 MG PO TABS: 650.0000 mg | ORAL_TABLET | Freq: Four times a day (QID) | ORAL | Status: DC | PRN

## 2017-12-02 MED ORDER — VITAMIN D 1000 UNITS PO TABS
5000.0000 [IU] | ORAL_TABLET | Freq: Every day | ORAL | Status: DC
  Administered 2017-12-03 – 2017-12-06 (×4): 5000 [IU] via ORAL
  Filled 2017-12-02 (×4): qty 5

## 2017-12-02 MED ORDER — IPRATROPIUM-ALBUTEROL 0.5-2.5 (3) MG/3ML IN SOLN
3.0000 mL | Freq: Four times a day (QID) | RESPIRATORY_TRACT | Status: DC
  Administered 2017-12-03: 3 mL via RESPIRATORY_TRACT
  Filled 2017-12-02: qty 3

## 2017-12-02 MED ORDER — HYDROCODONE-ACETAMINOPHEN 5-325 MG PO TABS
1.0000 | ORAL_TABLET | ORAL | Status: DC | PRN
  Administered 2017-12-04: 1 via ORAL
  Filled 2017-12-02: qty 1

## 2017-12-02 MED ORDER — CHLORPROMAZINE HCL 25 MG PO TABS: 50.0000 mg | ORAL_TABLET | Freq: Four times a day (QID) | ORAL | Status: DC

## 2017-12-02 MED ORDER — ATORVASTATIN CALCIUM 20 MG PO TABS
20.0000 mg | ORAL_TABLET | Freq: Every day | ORAL | Status: DC
  Administered 2017-12-03 (×2): 20 mg via ORAL
  Filled 2017-12-02 (×2): qty 1

## 2017-12-02 MED ORDER — INSULIN ASPART 100 UNIT/ML ~~LOC~~ SOLN
0.0000 [IU] | Freq: Three times a day (TID) | SUBCUTANEOUS | Status: DC
Start: 2017-12-03 — End: 2017-12-06
  Administered 2017-12-03 – 2017-12-04 (×4): 1 [IU] via SUBCUTANEOUS
  Administered 2017-12-05: 2 [IU] via SUBCUTANEOUS
  Administered 2017-12-06: 1 [IU] via SUBCUTANEOUS

## 2017-12-02 MED ORDER — CLOZAPINE 100 MG PO TABS: 200.0000 mg | ORAL_TABLET | Freq: Every day | ORAL | Status: DC

## 2017-12-02 MED ORDER — SENNOSIDES-DOCUSATE SODIUM 8.6-50 MG PO TABS
1.0000 | ORAL_TABLET | Freq: Every evening | ORAL | Status: DC | PRN
  Administered 2017-12-02: 1 via ORAL
  Filled 2017-12-02: qty 1

## 2017-12-02 MED ORDER — PIPERACILLIN-TAZOBACTAM 3.375 G IVPB 30 MIN
3.3750 g | Freq: Once | INTRAVENOUS | Status: AC
  Administered 2017-12-03: 3.375 g via INTRAVENOUS
  Filled 2017-12-02: qty 50

## 2017-12-02 MED ORDER — ACETAMINOPHEN 500 MG PO TABS
1000.0000 mg | ORAL_TABLET | Freq: Once | ORAL | Status: AC
  Administered 2017-12-02: 1000 mg via ORAL
  Filled 2017-12-02: qty 2

## 2017-12-02 MED ORDER — ASPIRIN EC 81 MG PO TBEC
81.0000 mg | DELAYED_RELEASE_TABLET | Freq: Every day | ORAL | Status: DC
  Administered 2017-12-03 – 2017-12-06 (×4): 81 mg via ORAL
  Filled 2017-12-02 (×4): qty 1

## 2017-12-02 MED ORDER — HEPARIN SODIUM (PORCINE) 5000 UNIT/ML IJ SOLN
5000.0000 [IU] | Freq: Three times a day (TID) | INTRAMUSCULAR | Status: DC
  Administered 2017-12-03 – 2017-12-06 (×11): 5000 [IU] via SUBCUTANEOUS
  Filled 2017-12-02 (×13): qty 1

## 2017-12-02 MED ORDER — CLOZAPINE 25 MG PO TABS: 50.0000 mg | ORAL_TABLET | Freq: Three times a day (TID) | ORAL | Status: DC

## 2017-12-02 MED ORDER — CHLORPROMAZINE HCL 25 MG PO TABS: 100.0000 mg | ORAL_TABLET | Freq: Every day | ORAL | Status: DC

## 2017-12-02 MED ORDER — PIPERACILLIN-TAZOBACTAM 3.375 G IVPB
3.3750 g | Freq: Three times a day (TID) | INTRAVENOUS | Status: DC
  Administered 2017-12-03 – 2017-12-05 (×7): 3.375 g via INTRAVENOUS
  Filled 2017-12-02 (×8): qty 50

## 2017-12-02 MED ORDER — ONDANSETRON HCL 4 MG PO TABS: 4.0000 mg | ORAL_TABLET | Freq: Four times a day (QID) | ORAL | Status: DC | PRN

## 2017-12-02 MED ORDER — NYSTATIN 100000 UNIT/GM EX POWD
1.0000 g | Freq: Three times a day (TID) | CUTANEOUS | Status: DC | PRN
  Filled 2017-12-02: qty 15

## 2017-12-02 MED ORDER — ONDANSETRON HCL 4 MG/2ML IJ SOLN: 4.0000 mg | Freq: Four times a day (QID) | INTRAMUSCULAR | Status: DC | PRN

## 2017-12-02 MED ORDER — CEFTRIAXONE SODIUM 1 G IJ SOLR
1.0000 g | Freq: Once | INTRAMUSCULAR | Status: AC
  Administered 2017-12-02: 1 g via INTRAVENOUS
  Filled 2017-12-02: qty 10

## 2017-12-02 MED ORDER — DOCUSATE SODIUM 100 MG PO CAPS
100.0000 mg | ORAL_CAPSULE | Freq: Every day | ORAL | Status: DC
  Administered 2017-12-03 – 2017-12-06 (×4): 100 mg via ORAL
  Filled 2017-12-02 (×4): qty 1

## 2017-12-02 MED ORDER — INSULIN ASPART 100 UNIT/ML ~~LOC~~ SOLN: 0.0000 [IU] | Freq: Every day | SUBCUTANEOUS | Status: DC

## 2017-12-02 MED ORDER — DEXTROSE 5 % IV SOLN
500.0000 mg | Freq: Once | INTRAVENOUS | Status: AC
  Administered 2017-12-02: 500 mg via INTRAVENOUS
  Filled 2017-12-02: qty 500

## 2017-12-02 MED ORDER — SODIUM CHLORIDE 0.9 % IV SOLN
INTRAVENOUS | Status: DC
  Administered 2017-12-03 (×2): via INTRAVENOUS

## 2017-12-02 MED ORDER — POLYETHYLENE GLYCOL 3350 17 G PO PACK
17.0000 g | PACK | Freq: Every day | ORAL | Status: DC
  Administered 2017-12-03 – 2017-12-06 (×4): 17 g via ORAL
  Filled 2017-12-02 (×4): qty 1

## 2017-12-02 MED ORDER — LAMOTRIGINE 25 MG PO TABS
50.0000 mg | ORAL_TABLET | Freq: Two times a day (BID) | ORAL | Status: DC
  Administered 2017-12-03 – 2017-12-06 (×8): 50 mg via ORAL
  Filled 2017-12-02 (×8): qty 2

## 2017-12-02 MED ORDER — VANCOMYCIN HCL 10 G IV SOLR
1500.0000 mg | INTRAVENOUS | Status: AC
  Administered 2017-12-03: 1500 mg via INTRAVENOUS
  Filled 2017-12-02: qty 1500

## 2017-12-02 MED ORDER — ESCITALOPRAM OXALATE 10 MG PO TABS: 10.0000 mg | ORAL_TABLET | Freq: Every day | ORAL | Status: DC

## 2017-12-02 MED ORDER — SENNA 8.6 MG PO TABS
1.0000 | ORAL_TABLET | Freq: Every day | ORAL | Status: DC
  Administered 2017-12-03 – 2017-12-06 (×4): 8.6 mg via ORAL
  Filled 2017-12-02 (×4): qty 1

## 2017-12-02 MED ORDER — ACETAMINOPHEN 650 MG RE SUPP: 650.0000 mg | Freq: Four times a day (QID) | RECTAL | Status: DC | PRN

## 2017-12-02 NOTE — H&P (Signed)
History and Physical    [name] SHF:[id number] DOB: 03-Jan-1951 DOA: 12/02/2017  PCP: [name], FNP Patient coming from: Group home  Chief Complaint: Lower extremity pain and swelling  HPI: [name] is a 67 y.o. male with medical history significant of schizophrenia, CKD stage III, CLL/non-Hodgkin's lymphoma, prolonged QT syndrome, hypertension lipidemia, hypothyroidism, COPD was sent to the hospital for evaluation of lower extremity pain and swelling.  Patient states he has been experiencing bilateral lower extremity pain especially on the right side for the past 5 days and it has continued to worsen.  Due to this pain he has had difficulty time bearing weight on it and ambulating as well.  Reports of subjective fevers and chills at home but denies any other complaints.  Due to these complaints his group home called EMS to bring him to the hospital for evaluation. Patient admits of some nonproductive cough but denies any sick contacts or shortness of breath at this time.  Initially in the ER patient was noted to be febrile at 103 F rectally, tachycardic with heart rate of 122, slight hypoxia at 88% on room air.  His labs showed leukocytosis and elevated lactate.  Rest of the labs were unremarkable.  He was started on IV antibiotics Rocephin and azithromycin.  Chest x-ray was suggestive of possible bilateral infiltrates at the bases.  On physical exam he was noted to have right lower extremity erythema and swelling concerning for cellulitis.  Patient was admitted here back in November 2018 for similar reason right lower extremity cellulitis and/or left lower lobe pneumonia.    Review of Systems: As per HPI otherwise 10 point review of systems negative.   Past Medical History:  Diagnosis Date  . Cellulitis   . CHF (congestive heart failure) ([place])   . Chronic kidney disease 09/20/2008  . Chronic lymphocytic leukemia ([place])   . cll dx'd 10/2003   no rx  . CLL (chronic  lymphoblastic leukemia) 10/2003  . COPD (chronic obstructive pulmonary disease) ([place])   . GERD (gastroesophageal reflux disease)   . Hiatal hernia   . Hyperlipemia   . Hypertension   . Lower extremity edema   . NHL (non-Hodgkin's lymphoma) ([place]) dx'd 08/2008 rt groin   chemo comp 08/2008  . Non Hodgkin's lymphoma ([place]) 08/2008  . Non Hodgkin's lymphoma ([place])   . Paranoid schizophrenia ([place])   . Prolonged Q-T interval on ECG   . RBBB 09/17/2017  . Schizoaffective disorder ([place])   . Sepsis ([place])   . Severe left ventricular hypertrophy 10/21/2017  . Vitamin D deficiency     No past surgical history on file.   reports that  has never smoked. he has never used smokeless tobacco. He reports that he does not drink alcohol or use drugs.  Allergies  Allergen Reactions  . Sulfamethoxazole Rash    Family History  Problem Relation Age of Onset  . Alcohol abuse Father      Prior to Admission medications   Medication Sig Start Date End Date Taking? Authorizing Provider  acetaminophen (TYLENOL) 325 MG tablet Take 650 mg by mouth every 6 (six) hours as needed for mild pain, moderate pain or headache.   Yes [provider]  aspirin EC 81 MG tablet Take 81 mg by mouth daily.   Yes [provider]  atorvastatin (LIPITOR) 20 MG tablet Take 20 mg by mouth at bedtime.   Yes [provider]  chlorproMAZINE (THORAZINE) 100 MG tablet Take 100 mg at  bedtime by mouth.    Yes [provider]  chlorproMAZINE (THORAZINE) 50 MG tablet Take 50 mg 4 (four) times daily by mouth.    Yes [provider]  Cholecalciferol (VITAMIN D-3 PO) Take 5,000 mg by mouth daily.   Yes [provider]  cloZAPine (CLOZARIL) 100 MG tablet Take 200 mg by mouth at bedtime. Take with 50 mg tablet for a total of 250 mg nightly   Yes [provider]  clozapine (CLOZARIL) 50 MG tablet Take 50 mg 3 (three) times daily by mouth.    Yes [provider]  docusate  sodium (COLACE) 100 MG capsule Take 100 mg by mouth daily.   Yes [provider]  escitalopram (LEXAPRO) 10 MG tablet Take 10 mg by mouth daily before breakfast.    Yes [provider]  lamoTRIgine (LAMICTAL) 25 MG tablet Take 50 mg by mouth 2 (two) times daily.    Yes [provider]  levothyroxine (SYNTHROID, LEVOTHROID) 100 MCG tablet Take 100 mcg daily by mouth.    Yes [provider]  Nystatin POWD Apply 1 application 3 (three) times daily as needed topically (Rash).   Yes [provider]  Omega-3 Fatty Acids (FISH OIL) 1000 MG CAPS Take 4,000 mg by mouth daily.   Yes [provider]  polyethylene glycol (MIRALAX / GLYCOLAX) packet Take 17 g daily by mouth. Dissolve 17g in 8oz of water/juice and drink by mouth every day   Yes [provider]  senna (SENOKOT) 8.6 MG TABS tablet Take 1 tablet by mouth daily.   Yes [provider]    Physical Exam: Vitals:   12/02/17 1852 12/02/17 1930 12/02/17 2030 12/02/17 2230  BP:  136/72 119/61 119/82  Pulse:  (!) 122 (!) 117 (!) 104  Resp:  (!) 21 (!) 26 (!) 25  Temp: (S) (!) 103 F (39.4 C)     TempSrc: Rectal     SpO2:  95% 98% 96%  Weight:      Height:          Constitutional: NAD, calm, comfortable Vitals:   12/02/17 1852 12/02/17 1930 12/02/17 2030 12/02/17 2230  BP:  136/72 119/61 119/82  Pulse:  (!) 122 (!) 117 (!) 104  Resp:  (!) 21 (!) 26 (!) 25  Temp: (S) (!) 103 F (39.4 C)     TempSrc: Rectal     SpO2:  95% 98% 96%  Weight:      Height:       Eyes: PERRL, lids and conjunctivae normal ENMT: Mucous membranes are moist. Posterior pharynx clear of any exudate or lesions.Normal dentition.  Neck: normal, supple, no masses, no thyromegaly Respiratory: clear to auscultation bilaterally, no wheezing, no crackles. Normal respiratory effort. No accessory muscle use.  Cardiovascular: Regular rate and rhythm, no murmurs / rubs / gallops. No extremity edema. 2+  pedal pulses. No carotid bruits.  Abdomen: no tenderness, no masses palpated. No hepatosplenomegaly. Bowel sounds positive.  Musculoskeletal: Right lower extremity redness and swelling noted, nonpitting edema.  No open wounds or ulcers Skin: Right lower extremity erythema and warmth noted.  Slightly tender to palpation. Neurologic: CN 2-12 grossly intact. Sensation intact, DTR normal. Strength 5/5 in all 4.  Psychiatric: Normal judgment and insight. Alert and oriented x 3. Normal mood.     Labs on Admission: I have personally reviewed following labs and imaging studies  CBC: Recent Labs  Lab 12/02/17 2017  WBC 22.2*  NEUTROABS 20.0*  HGB 14.1  HCT 41.9  MCV 93.7  PLT 614   Basic Metabolic Panel: Recent Labs  Lab 12/02/17 2017  NA 140  K 4.7  CL 105  CO2 24  GLUCOSE 132*  BUN 19  CREATININE 2.02*  CALCIUM 9.6   GFR: Estimated Creatinine Clearance: 50.6 mL/min (A) (by C-G formula based on SCr of 2.02 mg/dL (H)). Liver Function Tests: Recent Labs  Lab 12/02/17 2017  AST 42*  ALT 24  ALKPHOS 93  BILITOT 1.0  PROT 7.1  ALBUMIN 4.4   No results for input(s): LIPASE, AMYLASE in the last 168 hours. No results for input(s): AMMONIA in the last 168 hours. Coagulation Profile: No results for input(s): INR, PROTIME in the last 168 hours. Cardiac Enzymes: No results for input(s): CKTOTAL, CKMB, CKMBINDEX, TROPONINI in the last 168 hours. BNP (last 3 results) Recent Labs    09/14/17 1527  PROBNP 57   HbA1C: No results for input(s): HGBA1C in the last 72 hours. CBG: No results for input(s): GLUCAP in the last 168 hours. Lipid Profile: No results for input(s): CHOL, HDL, LDLCALC, TRIG, CHOLHDL, LDLDIRECT in the last 72 hours. Thyroid Function Tests: No results for input(s): TSH, T4TOTAL, FREET4, T3FREE, THYROIDAB in the last 72 hours. Anemia Panel: No results for input(s): VITAMINB12, FOLATE, FERRITIN, TIBC, IRON, RETICCTPCT in the last 72 hours. Urine  analysis:    Component Value Date/Time   COLORURINE YELLOW 10/02/2017 1109   APPEARANCEUR CLEAR 10/02/2017 1109   LABSPEC 1.016 10/02/2017 1109   PHURINE 5.0 10/02/2017 1109   GLUCOSEU NEGATIVE 10/02/2017 1109   [place] 10/02/2017 1109   [place] 10/02/2017 1109   KETONESUR NEGATIVE 10/02/2017 1109   PROTEINUR NEGATIVE 10/02/2017 1109   UROBILINOGEN 0.2 05/09/2015 1740   NITRITE NEGATIVE 10/02/2017 1109   LEUKOCYTESUR NEGATIVE 10/02/2017 1109   Sepsis Labs: !!!!!!!!!!!!!!!!!!!!!!!!!!!!!!!!!!!!!!!!!!!! @LABRCNTIP (procalcitonin:4,lacticidven:4) )No results found for this or any previous visit (from the past 240 hour(s)).   Radiological Exams on Admission: Dg Chest 2 View  Result Date: 12/02/2017 CLINICAL DATA:  Chills and leg pain EXAM: CHEST  2 VIEW COMPARISON:  10/12/2017 FINDINGS: Low lung volumes. Patchy atelectasis versus minimal infiltrate at the left base. No pleural effusion. Stable cardiomediastinal silhouette. No pneumothorax. IMPRESSION: Low lung volumes with patchy atelectasis versus minimal infiltrate at the left base. Electronically Signed   By: Donavan Foil M.D.   On: 12/02/2017 19:24    EKG: Independently reviewed.   Assessment/Plan Active Problems:   Cellulitis    Sepsis secondary to right lower extremity edema Healthcare acquired pneumonia -Admit the patient to the hospital for further care - Cultures not done in the ER, antibiotic started.  I will order stat blood cultures, UA. Sputum Cx. MRSA screen -Continue patient on vancomycin and Zosyn, pharmacy to dose -Nebulizers as needed.  Check pro-calcitonin - Pain control, IV fluids, monitor urine output and vitals closely -Antipyretics as needed - Right lower extremity Dopplers to rule out DVT -Supplemental oxygen as necessary -Trend lactate.  Getting IV fluids  Right lower extremity pain and weakness - I think this is associated with a cellulitis.  Once he feels a little better we can  get PT/OT  History of schizophrenia -Continue home medications  Has history of prolonged QT interval - Currently on antipsychotic meds.  Benefit outweighs the risk.  If necessary will give any medications with caution.  Chronic kidney disease stage III -Baseline creatinine is around 2.2.  Patient remains at baseline.  Provide gentle hydration.  Monitor urine output and avoid  nephrotoxic drugs  History of hyperlipidemia -Continue home meds  Hypothyroidism -Continue Synthroid  DVT prophylaxis: Subcutaneous heparin Code Status: Full code Family Communication: None at bedside Disposition Plan: To be determined Consults called: None Admission status: Inpatient admission   Ankit Arsenio Loader MD [facility] Pager [phone]  If 7PM-7AM, please contact night-coverage [email or web address] Password [email or web address]  12/02/2017, 11:08 PM

## 2017-12-02 NOTE — ED Notes (Signed)
Hospitalist at bedside 

## 2017-12-02 NOTE — ED Notes (Signed)
PA at bedside.

## 2017-12-02 NOTE — ED Triage Notes (Signed)
Per EMS, patient comes from a group home. Per staff patient c/o chills and had leg pain. Pt c/o legs and back intermittently hurting. L side deficit from stroke several years ago. Pt also reports suicidal ideation, when asked, has a mental health history. EKG unremarkable. Alert and oriented x4.

## 2017-12-02 NOTE — ED Triage Notes (Signed)
Per patient, he came in for leg pain.  Patient states he is not suicidal now and when asked reports "only sometimes".

## 2017-12-02 NOTE — ED Provider Notes (Signed)
[facility] DEPT Provider Note   CSN: [id number] Arrival date & time: 12/02/17  1643     History   Chief Complaint Chief Complaint  Patient presents with  . Leg Pain  . Chills    HPI [name] is a 67 y.o. male.  HPI Patient presents to the emergency department with complaints of bilateral leg pain the patient states that he has no other complaints at this time the patient is a very poor historian.  The patient states he does not have any other complaints at this time.  The patient denies chest pain, shortness of breath, headache,blurred vision, neck pain, fever, cough, weakness, numbness, dizziness, anorexia, edema, abdominal pain, nausea, vomiting, diarrhea, rash, back pain, dysuria, hematemesis, bloody stool, near syncope, or syncope. Past Medical History:  Diagnosis Date  . Cellulitis   . CHF (congestive heart failure) ([place])   . Chronic kidney disease 09/20/2008  . Chronic lymphocytic leukemia ([place])   . cll dx'd 10/2003   no rx  . CLL (chronic lymphoblastic leukemia) 10/2003  . COPD (chronic obstructive pulmonary disease) ([place])   . GERD (gastroesophageal reflux disease)   . Hiatal hernia   . Hyperlipemia   . Hypertension   . Lower extremity edema   . NHL (non-Hodgkin's lymphoma) ([place]) dx'd 08/2008 rt groin   chemo comp 08/2008  . Non Hodgkin's lymphoma ([place]) 08/2008  . Non Hodgkin's lymphoma ([place])   . Paranoid schizophrenia ([place])   . Prolonged Q-T interval on ECG   . RBBB 09/17/2017  . Schizoaffective disorder ([place])   . Sepsis ([place])   . Severe left ventricular hypertrophy 10/21/2017  . Vitamin D deficiency     Patient Active Problem List   Diagnosis Date Noted  . Severe left ventricular hypertrophy 10/21/2017  . Stage II pressure injury of buttocks 10/03/2017  . Obesity (BMI 30-39.9) 10/03/2017  . Hyperlipidemia 10/03/2017  . Hypothyroidism 10/03/2017  . RBBB 09/17/2017  . Lobar pneumonia ([place]) 02/12/2016  . Cellulitis  of right lower extremity 03/28/2015  . Sepsis ([place]) 03/27/2015  . Prolonged QT interval   . CLL (chronic lymphocytic leukemia) ([place]) 09/12/2013  . Non Hodgkin's lymphoma ([place]) 09/12/2013  . Leukocytosis 12/24/2012  . Anemia 12/24/2012  . CKD (chronic kidney disease), stage III ([place]) 12/24/2012  . Schizophrenia ([place]) 12/24/2012    No past surgical history on file.     Home Medications    Prior to Admission medications   Medication Sig Start Date End Date Taking? Authorizing Provider  acetaminophen (TYLENOL) 325 MG tablet Take 650 mg by mouth every 6 (six) hours as needed for mild pain, moderate pain or headache.   Yes [provider]  aspirin EC 81 MG tablet Take 81 mg by mouth daily.   Yes [provider]  atorvastatin (LIPITOR) 20 MG tablet Take 20 mg by mouth at bedtime.   Yes [provider]  chlorproMAZINE (THORAZINE) 100 MG tablet Take 100 mg at bedtime by mouth.    Yes [provider]  chlorproMAZINE (THORAZINE) 50 MG tablet Take 50 mg 4 (four) times daily by mouth.    Yes [provider]  Cholecalciferol (VITAMIN D-3 PO) Take 5,000 mg by mouth daily.   Yes [provider]  cloZAPine (CLOZARIL) 100 MG tablet Take 200 mg by mouth at bedtime. Take with 50 mg tablet for a total of 250 mg nightly   Yes [provider]  clozapine (CLOZARIL) 50 MG tablet Take 50 mg 3 (three) times  daily by mouth.    Yes [provider]  docusate sodium (COLACE) 100 MG capsule Take 100 mg by mouth daily.   Yes [provider]  escitalopram (LEXAPRO) 10 MG tablet Take 10 mg by mouth daily before breakfast.    Yes [provider]  lamoTRIgine (LAMICTAL) 25 MG tablet Take 50 mg by mouth 2 (two) times daily.    Yes [provider]  levothyroxine (SYNTHROID, LEVOTHROID) 100 MCG tablet Take 100 mcg daily by mouth.    Yes [provider]  Nystatin POWD Apply 1 application 3 (three) times daily as needed  topically (Rash).   Yes [provider]  Omega-3 Fatty Acids (FISH OIL) 1000 MG CAPS Take 4,000 mg by mouth daily.   Yes [provider]  polyethylene glycol (MIRALAX / GLYCOLAX) packet Take 17 g daily by mouth. Dissolve 17g in 8oz of water/juice and drink by mouth every day   Yes [provider]  senna (SENOKOT) 8.6 MG TABS tablet Take 1 tablet by mouth daily.   Yes [provider]    Family History Family History  Problem Relation Age of Onset  . Alcohol abuse Father     Social History Social History   Tobacco Use  . Smoking status: Never Smoker  . Smokeless tobacco: Never Used  Substance Use Topics  . Alcohol use: No  . Drug use: No     Allergies   Sulfamethoxazole   Review of Systems Review of Systems Level 5 caveat applies due to poor historian  Physical Exam Updated Vital Signs BP 119/61   Pulse (!) 117   Temp (S) (!) 103 F (39.4 C) (Rectal) Comment: PA notified  Resp (!) 26   Ht 5\' 11"  (1.803 m)   Wt 129.3 kg (285 lb)   SpO2 98%   BMI 39.75 kg/m   Physical Exam  Constitutional: He is oriented to person, place, and time. He appears well-developed and well-nourished. No distress.  HENT:  Head: Normocephalic and atraumatic.  Mouth/Throat: Oropharynx is clear and moist.  Eyes: Pupils are equal, round, and reactive to light.  Neck: Normal range of motion. Neck supple.  Cardiovascular: Normal rate, regular rhythm and normal heart sounds. Exam reveals no gallop and no friction rub.  No murmur heard. Pulmonary/Chest: Effort normal and breath sounds normal. No respiratory distress. He has no wheezes.  Abdominal: Soft. Bowel sounds are normal. He exhibits no distension. There is no tenderness.  Musculoskeletal:       Legs: Neurological: He is alert and oriented to person, place, and time. He exhibits normal muscle tone. Coordination normal.  Skin: Skin is warm and dry. Capillary refill takes less than 2 seconds. No rash  noted. No erythema.  Psychiatric: He has a normal mood and affect. His behavior is normal.  Nursing note and vitals reviewed.    ED Treatments / Results  Labs (all labs ordered are listed, but only abnormal results are displayed) Labs Reviewed  COMPREHENSIVE METABOLIC PANEL - Abnormal; Notable for the following components:      Result Value   Glucose, Bld 132 (*)    Creatinine, Ser 2.02 (*)    AST 42 (*)    GFR calc non Af Amer 33 (*)    GFR calc Af Amer 38 (*)    All other components within normal limits  CBC WITH DIFFERENTIAL/PLATELET - Abnormal; Notable for the following components:   WBC 22.2 (*)    Neutro Abs 20.0 (*)  Monocytes Absolute 1.3 (*)    All other components within normal limits  I-STAT CG4 LACTIC ACID, ED - Abnormal; Notable for the following components:   Lactic Acid, Venous 3.63 (*)    All other components within normal limits  INFLUENZA PANEL BY PCR (TYPE A & B)  URINALYSIS, ROUTINE W REFLEX MICROSCOPIC  I-STAT CG4 LACTIC ACID, ED    EKG  EKG Interpretation None       Radiology Dg Chest 2 View  Result Date: 12/02/2017 CLINICAL DATA:  Chills and leg pain EXAM: CHEST  2 VIEW COMPARISON:  10/12/2017 FINDINGS: Low lung volumes. Patchy atelectasis versus minimal infiltrate at the left base. No pleural effusion. Stable cardiomediastinal silhouette. No pneumothorax. IMPRESSION: Low lung volumes with patchy atelectasis versus minimal infiltrate at the left base. Electronically Signed   By: Donavan Foil M.D.   On: 12/02/2017 19:24    Procedures Procedures (including critical care time)  Medications Ordered in ED Medications  sodium chloride 0.9 % bolus 1,000 mL (1,000 mLs Intravenous New Bag/Given 12/02/17 2018)  cefTRIAXone (ROCEPHIN) 1 g in dextrose 5 % 50 mL IVPB (0 g Intravenous Stopped 12/02/17 2128)  azithromycin (ZITHROMAX) 500 mg in dextrose 5 % 250 mL IVPB (500 mg Intravenous Restarted 12/02/17 2128)     Initial Impression / Assessment and  Plan / ED Course  I have reviewed the triage vital signs and the nursing notes.  Pertinent labs & imaging results that were available during my care of the patient were reviewed by me and considered in my medical decision making (see chart for details).     Patient will need admission to the hospital to further delineate the exact cause of this infectious source or whether it is multifactorial.  There is what looks to be a cellulitis at this time.  Along with a possible lower lobe pneumonia.  Patient was given antibiotics.  Spoke with the [facility] who will admit the patient for further evaluation  Final Clinical Impressions(s) / ED Diagnoses   Final diagnoses:  None    ED Discharge Orders    None       Dalia Heading, PA-C 12/03/17 0108    Macarthur Critchley, MD 12/03/17 1650

## 2017-12-02 NOTE — Progress Notes (Signed)
A consult was received from an ED physician for rocephin/zithromax per pharmacy dosing.  The patient's profile has been reviewed for ht/wt/allergies/indication/available labs.   A one time order has been placed for rocephin 1g and zithromax 500mg .  Further antibiotics/pharmacy consults should be ordered by admitting physician if indicated.                       Thank you, Kara Mead 12/02/2017  9:00 PM

## 2017-12-02 NOTE — ED Notes (Signed)
Unsuccessful IV attempt x2. IV team consult placed. ?

## 2017-12-02 NOTE — ED Notes (Signed)
Patient's guardian, [name], at beside. Requesting to be called when patient is admitted. [phone]

## 2017-12-02 NOTE — ED Notes (Signed)
IV team at bedside 

## 2017-12-03 ENCOUNTER — Encounter (HOSPITAL_COMMUNITY): Payer: Self-pay

## 2017-12-03 ENCOUNTER — Other Ambulatory Visit: Payer: Self-pay

## 2017-12-03 ENCOUNTER — Inpatient Hospital Stay (HOSPITAL_COMMUNITY)
Admit: 2017-12-03 | Discharge: 2017-12-03 | Disposition: A | Discharge status: Home / Self Care | Payer: Medicare Other | Attending: Internal Medicine | Admitting: Internal Medicine

## 2017-12-03 DIAGNOSIS — M7989 Other specified soft tissue disorders: Secondary | ICD-10-CM

## 2017-12-03 DIAGNOSIS — D72829 Elevated white blood cell count, unspecified: Secondary | ICD-10-CM

## 2017-12-03 LAB — COMPREHENSIVE METABOLIC PANEL
ALK PHOS: 55 U/L (ref 38–126)
ALT: 15 U/L — ABNORMAL LOW (ref 17–63)
ANION GAP: 6 (ref 5–15)
AST: 23 U/L (ref 15–41)
Albumin: 2.7 g/dL — ABNORMAL LOW (ref 3.5–5.0)
BUN: 20 mg/dL (ref 6–20)
CALCIUM: 7.5 mg/dL — AB (ref 8.9–10.3)
CO2: 22 mmol/L (ref 22–32)
Chloride: 111 mmol/L (ref 101–111)
Creatinine, Ser: 2.02 mg/dL — ABNORMAL HIGH (ref 0.61–1.24)
GFR calc Af Amer: 38 mL/min — ABNORMAL LOW (ref >60)
GFR calc non Af Amer: 33 mL/min — ABNORMAL LOW (ref >60)
Glucose, Bld: 139 mg/dL — ABNORMAL HIGH (ref 65–99)
POTASSIUM: 3.7 mmol/L (ref 3.5–5.1)
SODIUM: 139 mmol/L (ref 135–145)
Total Bilirubin: 0.6 mg/dL (ref 0.3–1.2)
Total Protein: 4.7 g/dL — ABNORMAL LOW (ref 6.5–8.1)

## 2017-12-03 LAB — GLUCOSE, CAPILLARY
GLUCOSE-CAPILLARY: 131 mg/dL — AB (ref 65–99)
Glucose-Capillary: 194 mg/dL — ABNORMAL HIGH (ref 65–99)

## 2017-12-03 LAB — URINALYSIS, ROUTINE W REFLEX MICROSCOPIC
BILIRUBIN URINE: NEGATIVE
Glucose, UA: NEGATIVE mg/dL
HGB URINE DIPSTICK: NEGATIVE
Ketones, ur: NEGATIVE mg/dL
LEUKOCYTES UA: NEGATIVE
NITRITE: NEGATIVE
PH: 5 (ref 5.0–8.0)
Protein, ur: NEGATIVE mg/dL
SPECIFIC GRAVITY, URINE: 1.014 (ref 1.005–1.030)
Squamous Epithelial / LPF: NONE SEEN

## 2017-12-03 LAB — CBC
HCT: 30.8 % — ABNORMAL LOW (ref 39.0–52.0)
HEMATOCRIT: 38.8 % — AB (ref 39.0–52.0)
HEMOGLOBIN: 10.4 g/dL — AB (ref 13.0–17.0)
HEMOGLOBIN: 12.9 g/dL — AB (ref 13.0–17.0)
MCH: 31.4 pg (ref 26.0–34.0)
MCH: 31.8 pg (ref 26.0–34.0)
MCHC: 33.2 g/dL (ref 30.0–36.0)
MCHC: 33.8 g/dL (ref 30.0–36.0)
MCV: 94.2 fL (ref 78.0–100.0)
MCV: 94.4 fL (ref 78.0–100.0)
Platelets: 138 10*3/uL — ABNORMAL LOW (ref 150–400)
Platelets: 159 10*3/uL (ref 150–400)
RBC: 3.27 MIL/uL — ABNORMAL LOW (ref 4.22–5.81)
RBC: 4.11 MIL/uL — AB (ref 4.22–5.81)
RDW: 14.3 % (ref 11.5–15.5)
RDW: 14.4 % (ref 11.5–15.5)
WBC: 23.3 10*3/uL — ABNORMAL HIGH (ref 4.0–10.5)
WBC: 29.9 10*3/uL — AB (ref 4.0–10.5)

## 2017-12-03 LAB — CREATININE, SERUM
CREATININE: 2.33 mg/dL — AB (ref 0.61–1.24)
GFR, EST AFRICAN AMERICAN: 32 mL/min — AB (ref >60)
GFR, EST NON AFRICAN AMERICAN: 28 mL/min — AB (ref >60)

## 2017-12-03 LAB — MRSA PCR SCREENING: MRSA BY PCR: NEGATIVE

## 2017-12-03 LAB — CBG MONITORING, ED
GLUCOSE-CAPILLARY: 114 mg/dL — AB (ref 65–99)
Glucose-Capillary: 115 mg/dL — ABNORMAL HIGH (ref 65–99)

## 2017-12-03 LAB — PROCALCITONIN
PROCALCITONIN: 16.95 ng/mL
Procalcitonin: 17.77 ng/mL

## 2017-12-03 LAB — TSH: TSH: 1.212 u[IU]/mL (ref 0.350–4.500)

## 2017-12-03 LAB — HIV ANTIBODY (ROUTINE TESTING W REFLEX): HIV SCREEN 4TH GENERATION: NONREACTIVE

## 2017-12-03 MED ORDER — IPRATROPIUM-ALBUTEROL 0.5-2.5 (3) MG/3ML IN SOLN
3.0000 mL | RESPIRATORY_TRACT | Status: DC | PRN
  Administered 2017-12-06: 3 mL via RESPIRATORY_TRACT
  Filled 2017-12-03: qty 3

## 2017-12-03 MED ORDER — IPRATROPIUM-ALBUTEROL 0.5-2.5 (3) MG/3ML IN SOLN
3.0000 mL | Freq: Three times a day (TID) | RESPIRATORY_TRACT | Status: DC
  Administered 2017-12-03: 3 mL via RESPIRATORY_TRACT
  Filled 2017-12-03 (×2): qty 3

## 2017-12-03 MED ORDER — VANCOMYCIN HCL 10 G IV SOLR
1750.0000 mg | INTRAVENOUS | Status: DC
  Administered 2017-12-04: 1750 mg via INTRAVENOUS
  Filled 2017-12-03: qty 1750

## 2017-12-03 NOTE — ED Notes (Signed)
ED TO INPATIENT HANDOFF REPORT  Name/Age/Gender [name] 67 y.o. male  Code Status    Code Status Orders  (From admission, onward)        Start     Ordered   12/02/17 2305  Full code  Continuous     12/02/17 2308    Code Status History    Date Active Date Inactive Code Status Order ID Comments User Context   10/02/2017 10:09 10/04/2017 18:30 Full Code [id number]  Elease Hashimoto ED   06/03/2016 16:04 06/06/2016 19:00 Full Code [id number]  Collene Gobble, MD Inpatient   02/13/2016 01:28 02/17/2016 19:59 Full Code [id number]  Phillips Grout, MD Inpatient   05/15/2015 23:11 05/17/2015 18:39 Full Code [id number]  Evelina Bucy, MD ED   04/06/2015 12:43 04/11/2015 13:57 Full Code [id number]  Oswald Hillock, MD Inpatient   03/27/2015 21:38 03/31/2015 14:37 Full Code [id number]  Toy Baker, MD Inpatient   12/24/2012 15:55 12/26/2012 18:12 Full Code [id number]  Robbie Lis, MD Inpatient      Home/SNF/Other Boarding Home  Chief Complaint Shaking,Leg Pain  Level of Care/Admitting Diagnosis ED Disposition    ED Disposition Condition [facility] [[id number]]  Level of Care: Med-Surg [16]  Diagnosis: Cellulitis [[id number]]  Admitting Physician: Gerlean Ren [facility] [[id number]]  Attending Physician: Gerlean Ren [facility] [[id number]]  Estimated length of stay: past midnight tomorrow  Certification:: I certify this patient will need inpatient services for at least 2 midnights  PT Class (Do Not Modify): Inpatient [101]  PT Acc Code (Do Not Modify): Private [1]       Medical History Past Medical History:  Diagnosis Date  . Cellulitis   . CHF (congestive heart failure) ([place])   . Chronic kidney disease 09/20/2008  . Chronic lymphocytic leukemia ([place])   . cll dx'd 10/2003   no rx  . CLL (chronic lymphoblastic leukemia) 10/2003  . COPD (chronic obstructive pulmonary disease) ([place])   . GERD (gastroesophageal reflux disease)   . Hiatal  hernia   . Hyperlipemia   . Hypertension   . Lower extremity edema   . NHL (non-Hodgkin's lymphoma) ([place]) dx'd 08/2008 rt groin   chemo comp 08/2008  . Non Hodgkin's lymphoma ([place]) 08/2008  . Non Hodgkin's lymphoma ([place])   . Paranoid schizophrenia ([place])   . Prolonged Q-T interval on ECG   . RBBB 09/17/2017  . Schizoaffective disorder ([place])   . Sepsis ([place])   . Severe left ventricular hypertrophy 10/21/2017  . Vitamin D deficiency     Allergies Allergies  Allergen Reactions  . Sulfamethoxazole Rash    IV Location/Drains/Wounds Patient Lines/Drains/Airways Status   Active Line/Drains/Airways    Name:   Placement date:   Placement time:   Site:   Days:   Peripheral IV 12/02/17 Left Hand   12/02/17    2020    Hand   1   Peripheral IV 12/03/17 Right Hand   12/03/17    0004    Hand   less than 1   Peripheral IV 12/03/17 Left;Anterior Forearm   12/03/17    0451    Forearm   less than 1   Pressure Injury 10/02/17 Stage II -  Partial thickness loss of dermis presenting as a shallow open ulcer with a red, pink wound bed without slough.   10/02/17    1636     62          Labs/Imaging  Results for orders placed or performed during the hospital encounter of 12/02/17 (from the past 48 hour(s))  Influenza panel by PCR (type A & B)     Status: None   Collection Time: 12/02/17  6:54 PM  Result Value Ref Range   Influenza A By PCR NEGATIVE NEGATIVE   Influenza B By PCR NEGATIVE NEGATIVE    Comment: (NOTE) The Xpert Xpress Flu assay is intended as an aid in the diagnosis of  influenza and should not be used as a sole basis for treatment.  This  assay is FDA approved for nasopharyngeal swab specimens only. Nasal  washings and aspirates are unacceptable for Xpert Xpress Flu testing.   Comprehensive metabolic panel     Status: Abnormal   Collection Time: 12/02/17  8:17 PM  Result Value Ref Range   Sodium 140 135 - 145 mmol/L   Potassium 4.7 3.5 - 5.1 mmol/L   Chloride 105 101 - 111  mmol/L   CO2 24 22 - 32 mmol/L   Glucose, Bld 132 (H) 65 - 99 mg/dL   BUN 19 6 - 20 mg/dL   Creatinine, Ser 2.02 (H) 0.61 - 1.24 mg/dL   Calcium 9.6 8.9 - 10.3 mg/dL   Total Protein 7.1 6.5 - 8.1 g/dL   Albumin 4.4 3.5 - 5.0 g/dL   AST 42 (H) 15 - 41 U/L   ALT 24 17 - 63 U/L   Alkaline Phosphatase 93 38 - 126 U/L   Total Bilirubin 1.0 0.3 - 1.2 mg/dL   GFR calc non Af Amer 33 (L) >60 mL/min   GFR calc Af Amer 38 (L) >60 mL/min    Comment: (NOTE) The eGFR has been calculated using the CKD EPI equation. This calculation has not been validated in all clinical situations. eGFR's persistently <60 mL/min signify possible Chronic Kidney Disease.    Anion gap 11 5 - 15  CBC with Differential     Status: Abnormal   Collection Time: 12/02/17  8:17 PM  Result Value Ref Range   WBC 22.2 (H) 4.0 - 10.5 K/uL   RBC 4.47 4.22 - 5.81 MIL/uL   Hemoglobin 14.1 13.0 - 17.0 g/dL   HCT 41.9 39.0 - 52.0 %   MCV 93.7 78.0 - 100.0 fL   MCH 31.5 26.0 - 34.0 pg   MCHC 33.7 30.0 - 36.0 g/dL   RDW 14.2 11.5 - 15.5 %   Platelets 158 150 - 400 K/uL   Neutrophils Relative % 90 %   Neutro Abs 20.0 (H) 1.7 - 7.7 K/uL   Lymphocytes Relative 4 %   Lymphs Abs 0.9 0.7 - 4.0 K/uL   Monocytes Relative 6 %   Monocytes Absolute 1.3 (H) 0.1 - 1.0 K/uL   Eosinophils Relative 0 %   Eosinophils Absolute 0.0 0.0 - 0.7 K/uL   Basophils Relative 0 %   Basophils Absolute 0.0 0.0 - 0.1 K/uL  I-Stat CG4 Lactic Acid, ED     Status: Abnormal   Collection Time: 12/02/17  8:28 PM  Result Value Ref Range   Lactic Acid, Venous 3.63 (HH) 0.5 - 1.9 mmol/L   Comment NOTIFIED PHYSICIAN   I-Stat CG4 Lactic Acid, ED     Status: Abnormal   Collection Time: 12/02/17 10:51 PM  Result Value Ref Range   Lactic Acid, Venous 2.45 (HH) 0.5 - 1.9 mmol/L   Comment NOTIFIED PHYSICIAN   CBG monitoring, ED     Status: Abnormal   Collection Time: 12/02/17 11:43 PM  Result Value Ref Range   Glucose-Capillary 140 (H) 65 - 99 mg/dL  CBC      Status: Abnormal   Collection Time: 12/03/17 12:11 AM  Result Value Ref Range   WBC 29.9 (H) 4.0 - 10.5 K/uL   RBC 4.11 (L) 4.22 - 5.81 MIL/uL   Hemoglobin 12.9 (L) 13.0 - 17.0 g/dL   HCT 38.8 (L) 39.0 - 52.0 %   MCV 94.4 78.0 - 100.0 fL   MCH 31.4 26.0 - 34.0 pg   MCHC 33.2 30.0 - 36.0 g/dL   RDW 14.3 11.5 - 15.5 %   Platelets 159 150 - 400 K/uL  Creatinine, serum     Status: Abnormal   Collection Time: 12/03/17 12:11 AM  Result Value Ref Range   Creatinine, Ser 2.33 (H) 0.61 - 1.24 mg/dL   GFR calc non Af Amer 28 (L) >60 mL/min   GFR calc Af Amer 32 (L) >60 mL/min    Comment: (NOTE) The eGFR has been calculated using the CKD EPI equation. This calculation has not been validated in all clinical situations. eGFR's persistently <60 mL/min signify possible Chronic Kidney Disease.   TSH     Status: None   Collection Time: 12/03/17 12:11 AM  Result Value Ref Range   TSH 1.212 0.350 - 4.500 uIU/mL    Comment: Performed by a 3rd Generation assay with a functional sensitivity of <=0.01 uIU/mL.  Procalcitonin - Baseline     Status: None   Collection Time: 12/03/17 12:11 AM  Result Value Ref Range   Procalcitonin 16.95 ng/mL    Comment:        Interpretation: PCT >= 10 ng/mL: Important systemic inflammatory response, almost exclusively due to severe bacterial sepsis or septic shock. (NOTE)       Sepsis PCT Algorithm           Lower Respiratory Tract                                      Infection PCT Algorithm    ----------------------------     ----------------------------         PCT < 0.25 ng/mL                PCT < 0.10 ng/mL         Strongly encourage             Strongly discourage   discontinuation of antibiotics    initiation of antibiotics    ----------------------------     -----------------------------       PCT 0.25 - 0.50 ng/mL            PCT 0.10 - 0.25 ng/mL               OR       >80% decrease in PCT            Discourage initiation of                                             antibiotics      Encourage discontinuation           of antibiotics    ----------------------------     -----------------------------         PCT >= 0.50 ng/mL  PCT 0.26 - 0.50 ng/mL                AND       <80% decrease in PCT             Encourage initiation of                                             antibiotics       Encourage continuation           of antibiotics    ----------------------------     -----------------------------        PCT >= 0.50 ng/mL                  PCT > 0.50 ng/mL               AND         increase in PCT                  Strongly encourage                                      initiation of antibiotics    Strongly encourage escalation           of antibiotics                                     -----------------------------                                           PCT <= 0.25 ng/mL                                                 OR                                        > 80% decrease in PCT                                     Discontinue / Do not initiate                                             antibiotics   CBC     Status: Abnormal   Collection Time: 12/03/17  4:58 AM  Result Value Ref Range   WBC 23.3 (H) 4.0 - 10.5 K/uL   RBC 3.27 (L) 4.22 - 5.81 MIL/uL   Hemoglobin 10.4 (L) 13.0 - 17.0 g/dL   HCT 30.8 (L) 39.0 - 52.0 %   MCV 94.2 78.0 - 100.0 fL   MCH 31.8 26.0 - 34.0 pg   MCHC 33.8 30.0 - 36.0 g/dL  RDW 14.4 11.5 - 15.5 %   Platelets 138 (L) 150 - 400 K/uL  Comprehensive metabolic panel     Status: Abnormal   Collection Time: 12/03/17  4:58 AM  Result Value Ref Range   Sodium 139 135 - 145 mmol/L   Potassium 3.7 3.5 - 5.1 mmol/L    Comment: DELTA CHECK NOTED   Chloride 111 101 - 111 mmol/L   CO2 22 22 - 32 mmol/L   Glucose, Bld 139 (H) 65 - 99 mg/dL   BUN 20 6 - 20 mg/dL   Creatinine, Ser 2.02 (H) 0.61 - 1.24 mg/dL   Calcium 7.5 (L) 8.9 - 10.3 mg/dL    Comment: DELTA CHECK NOTED REPEATED TO  VERIFY    Total Protein 4.7 (L) 6.5 - 8.1 g/dL   Albumin 2.7 (L) 3.5 - 5.0 g/dL   AST 23 15 - 41 U/L   ALT 15 (L) 17 - 63 U/L   Alkaline Phosphatase 55 38 - 126 U/L   Total Bilirubin 0.6 0.3 - 1.2 mg/dL   GFR calc non Af Amer 33 (L) >60 mL/min   GFR calc Af Amer 38 (L) >60 mL/min    Comment: (NOTE) The eGFR has been calculated using the CKD EPI equation. This calculation has not been validated in all clinical situations. eGFR's persistently <60 mL/min signify possible Chronic Kidney Disease.    Anion gap 6 5 - 15  Procalcitonin     Status: None   Collection Time: 12/03/17  4:58 AM  Result Value Ref Range   Procalcitonin 17.77 ng/mL    Comment:        Interpretation: PCT >= 10 ng/mL: Important systemic inflammatory response, almost exclusively due to severe bacterial sepsis or septic shock. (NOTE)       Sepsis PCT Algorithm           Lower Respiratory Tract                                      Infection PCT Algorithm    ----------------------------     ----------------------------         PCT < 0.25 ng/mL                PCT < 0.10 ng/mL         Strongly encourage             Strongly discourage   discontinuation of antibiotics    initiation of antibiotics    ----------------------------     -----------------------------       PCT 0.25 - 0.50 ng/mL            PCT 0.10 - 0.25 ng/mL               OR       >80% decrease in PCT            Discourage initiation of                                            antibiotics      Encourage discontinuation           of antibiotics    ----------------------------     -----------------------------         PCT >= 0.50 ng/mL  PCT 0.26 - 0.50 ng/mL                AND       <80% decrease in PCT             Encourage initiation of                                             antibiotics       Encourage continuation           of antibiotics    ----------------------------     -----------------------------        PCT >= 0.50  ng/mL                  PCT > 0.50 ng/mL               AND         increase in PCT                  Strongly encourage                                      initiation of antibiotics    Strongly encourage escalation           of antibiotics                                     -----------------------------                                           PCT <= 0.25 ng/mL                                                 OR                                        > 80% decrease in PCT                                     Discontinue / Do not initiate                                             antibiotics   CBG monitoring, ED     Status: Abnormal   Collection Time: 12/03/17  7:20 AM  Result Value Ref Range   Glucose-Capillary 115 (H) 65 - 99 mg/dL  Urinalysis, Routine w reflex microscopic     Status: Abnormal   Collection Time: 12/03/17 10:17 AM  Result Value Ref Range   Color, Urine YELLOW YELLOW   APPearance CLEAR CLEAR   Specific Gravity, Urine 1.014 1.005 - 1.030   pH 5.0 5.0 -  8.0   Glucose, UA NEGATIVE NEGATIVE mg/dL   Hgb urine dipstick NEGATIVE NEGATIVE   Bilirubin Urine NEGATIVE NEGATIVE   Ketones, ur NEGATIVE NEGATIVE mg/dL   Protein, ur NEGATIVE NEGATIVE mg/dL   Nitrite NEGATIVE NEGATIVE   Leukocytes, UA NEGATIVE NEGATIVE   RBC / HPF 0-5 0 - 5 RBC/hpf   WBC, UA 0-5 0 - 5 WBC/hpf   Bacteria, UA RARE (A) NONE SEEN   Squamous Epithelial / LPF NONE SEEN NONE SEEN   Mucus PRESENT    Hyaline Casts, UA PRESENT    Dg Chest 2 View  Result Date: 12/02/2017 CLINICAL DATA:  Chills and leg pain EXAM: CHEST  2 VIEW COMPARISON:  10/12/2017 FINDINGS: Low lung volumes. Patchy atelectasis versus minimal infiltrate at the left base. No pleural effusion. Stable cardiomediastinal silhouette. No pneumothorax. IMPRESSION: Low lung volumes with patchy atelectasis versus minimal infiltrate at the left base. Electronically Signed   By: Donavan Foil M.D.   On: 12/02/2017 19:24    Pending  Labs Unresulted Labs (From admission, onward)   Start     Ordered   12/04/17 0500  CBC with Differential/Platelet  Tomorrow morning,   R     12/03/17 0929   12/04/17 0500  Comprehensive metabolic panel  Tomorrow morning,   R     12/03/17 0929   12/04/17 0500  Magnesium  Tomorrow morning,   R     12/03/17 0929   12/03/17 0500  Procalcitonin  Daily,   R     12/02/17 2320   12/02/17 2312  Culture, expectorated sputum-assessment  Once,   R     12/02/17 2312   12/02/17 2312  MRSA PCR Screening  Once,   R     12/02/17 2312   12/02/17 2310  Culture, blood (Routine X 2) w Reflex to ID Panel  BLOOD CULTURE X 2,   R     12/02/17 2309   12/02/17 2304  HIV antibody (Routine Testing)  Once,   R     12/02/17 2308      Vitals/Pain Today's Vitals   12/03/17 0949 12/03/17 1025 12/03/17 1034 12/03/17 1107  BP: (!) 112/59 103/67  114/67  Pulse: 86 81  81  Resp: '20 15  17  ' Temp:      TempSrc:      SpO2: 99% 100%  100%  Weight:      Height:      PainSc:   0-No pain     Isolation Precautions No active isolations  Medications Medications  levothyroxine (SYNTHROID, LEVOTHROID) tablet 100 mcg (100 mcg Oral Given 12/03/17 0842)  aspirin EC tablet 81 mg (81 mg Oral Given 12/03/17 0906)  docusate sodium (COLACE) capsule 100 mg (100 mg Oral Given 12/03/17 0908)  lamoTRIgine (LAMICTAL) tablet 50 mg (50 mg Oral Given 12/03/17 0908)  polyethylene glycol (MIRALAX / GLYCOLAX) packet 17 g (17 g Oral Given 12/03/17 1014)  cholecalciferol (VITAMIN D) tablet 5,000 Units (5,000 Units Oral Given 12/03/17 0906)  nystatin (MYCOSTATIN/NYSTOP) topical powder 1 g (not administered)  senna (SENOKOT) tablet 8.6 mg (8.6 mg Oral Given 12/03/17 0908)  atorvastatin (LIPITOR) tablet 20 mg (20 mg Oral Given 12/03/17 0233)  heparin injection 5,000 Units (5,000 Units Subcutaneous Given 12/03/17 0630)  0.9 %  sodium chloride infusion ( Intravenous New Bag/Given 12/03/17 0129)  acetaminophen (TYLENOL) tablet 650 mg (not  administered)    Or  acetaminophen (TYLENOL) suppository 650 mg (not administered)  HYDROcodone-acetaminophen (NORCO/VICODIN) 5-325 MG per tablet 1-2 tablet (  not administered)  senna-docusate (Senokot-S) tablet 1 tablet (1 tablet Oral Given 12/02/17 2335)  insulin aspart (novoLOG) injection 0-9 Units (0 Units Subcutaneous Not Given 12/03/17 0721)  insulin aspart (novoLOG) injection 0-5 Units (0 Units Subcutaneous Not Given 12/02/17 2315)  piperacillin-tazobactam (ZOSYN) IVPB 3.375 g (0 g Intravenous Stopped 12/03/17 0933)  vancomycin (VANCOCIN) 1,750 mg in sodium chloride 0.9 % 500 mL IVPB (not administered)  ipratropium-albuterol (DUONEB) 0.5-2.5 (3) MG/3ML nebulizer solution 3 mL (3 mLs Nebulization Given 12/03/17 0801)  sodium chloride 0.9 % bolus 1,000 mL (0 mLs Intravenous Stopped 12/02/17 2336)  cefTRIAXone (ROCEPHIN) 1 g in dextrose 5 % 50 mL IVPB (0 g Intravenous Stopped 12/02/17 2128)  azithromycin (ZITHROMAX) 500 mg in dextrose 5 % 250 mL IVPB (0 mg Intravenous Stopped 12/02/17 2227)  acetaminophen (TYLENOL) tablet 1,000 mg (1,000 mg Oral Given 12/02/17 2335)  vancomycin (VANCOCIN) 1,500 mg in sodium chloride 0.9 % 500 mL IVPB (0 mg Intravenous Stopped 12/03/17 0308)  piperacillin-tazobactam (ZOSYN) IVPB 3.375 g (0 g Intravenous Stopped 12/03/17 0138)    Mobility walks with person assist

## 2017-12-03 NOTE — Progress Notes (Signed)
Pharmacy Antibiotic Note  CHASTIN RIESGO is a 67 y.o. male admitted on 12/02/2017 with pneumonia, sepsis and cellulitis.  Pharmacy has been consulted for Vancomycin and Zosyn dosing.  Plan: Zosyn 3.375g IV q8h (4 hour infusion).  Vancomycin 1500mg  iv x1, then 1750mg  iv q48hr Goal AUC = 400 - 500 for all indications, except meningitis (goal AUC > 500 and Cmin 15-20 mcg/mL)   Height: 5\' 11"  (180.3 cm) Weight: 285 lb (129.3 kg) IBW/kg (Calculated) : 75.3  Temp (24hrs), Avg:100.6 F (38.1 C), Min:98.2 F (36.8 C), Max:103 F (39.4 C)  Recent Labs  Lab 12/02/17 2017 12/02/17 2028 12/02/17 2251 12/03/17 0011  WBC 22.2*  --   --  29.9*  CREATININE 2.02*  --   --  2.33*  LATICACIDVEN  --  3.63* 2.45*  --     Estimated Creatinine Clearance: 43.9 mL/min (A) (by C-G formula based on SCr of 2.33 mg/dL (H)).    Allergies  Allergen Reactions  . Sulfamethoxazole Rash    Antimicrobials this admission: Vancomycin 12/02/2017 >> Zosyn 12/02/2017 >>   Dose adjustments this admission: -  Microbiology results: pending  Thank you for allowing pharmacy to be a part of this patient's care.  Nani Skillern Crowford 12/03/2017 3:10 AM

## 2017-12-03 NOTE — Progress Notes (Signed)
Patient ID: [name], male   DOB: 25-Jan-1951, 67 y.o.   MRN: [id number]  PROGRESS NOTE    [name]  KYH:[id number] DOB: 1951/02/24 DOA: 12/02/2017 PCP: [name], FNP   Brief Narrative:  67 year old male with history of schizophrenia, CKD stage III, CLL/non-Hodgkin's lymphoma, prolonged QT syndrome, hypertension, hyperlipidemia, hypothyroidism, COPD was sent to the hospital for evaluation of lower extremity pain and swelling.  Patient was admitted with right lower extremity cellulitis and pneumonia and started on antibiotics.   Assessment & Plan:   Active Problems:   Cellulitis   Sepsis secondary to right lower extremity cellulitis and Healthcare acquired pneumonia -Continue fluids.  Continue broad-spectrum antibiotics.  Follow cultures.  Hemodynamically improving  Right lower extremity cellulitis -Continue vancomycin and Zosyn. -Follow right lower extremity Doppler to rule out DVT  Healthcare acquired pneumonia -With a concern for gram-negative rods versus MRSA pneumonia -Follow cultures.  Continue current antibiotics  Leukocytosis -Probably second above.  Repeat a.m. labs  History of schizophrenia -Continue home medications -Out patient follow-up with psychiatry  history of prolonged QT interval - Currently on antipsychotic meds.  Benefit outweighs the risk.    Monitor.  Chronic kidney disease stage III -Baseline creatinine is around 2.2.  Patient remains at baseline.  Provide gentle hydration.  Monitor urine output and avoid nephrotoxic drugs -Repeat a.m. Lab  History of hyperlipidemia -Continue home meds  Hypothyroidism -Continue Synthroid  Morbid obesity -Nutrition consult.  DVT prophylaxis: Heparin Code Status: Full Family Communication: None at bedside Disposition Plan: Depends on clinical outcome  Consultants: None  Procedures: None  Antimicrobials: Vancomycin and Zosyn from 12/02/2017 onwards   Subjective: Patient seen and  examined at bedside.  he denies current chest pain, shortness of breath, fever, nausea or vomiting.  Objective: Vitals:   12/03/17 0630 12/03/17 0700 12/03/17 0740 12/03/17 0811  BP: (!) 100/52 (!) 97/51 (!) 109/51 (!) 116/43  Pulse: 84 79 80 80  Resp: 20 20 18 20   Temp:      TempSrc:      SpO2: 98% 98% 100% 100%  Weight:      Height:        Intake/Output Summary (Last 24 hours) at 12/03/2017 0920 Last data filed at 12/03/2017 0308 Gross per 24 hour  Intake 1800 ml  Output -  Net 1800 ml   Filed Weights   12/02/17 1724  Weight: 129.3 kg (285 lb)    Examination:  General exam: Appears calm and comfortable  Respiratory system: Bilateral decreased breath sound at bases with some scattered crackles Cardiovascular system: S1 & S2 heard, rate controlled.  Gastrointestinal system: Abdomen is morbidly obese, distended, soft and nontender. Normal bowel sounds heard. Extremities: No cyanosis, clubbing; right lower extremity with redness and swelling and nonpitting edema but no open wounds or ulcers.  Data Reviewed: I have personally reviewed following labs and imaging studies  CBC: Recent Labs  Lab 12/02/17 2017 12/03/17 0011 12/03/17 0458  WBC 22.2* 29.9* 23.3*  NEUTROABS 20.0*  --   --   HGB 14.1 12.9* 10.4*  HCT 41.9 38.8* 30.8*  MCV 93.7 94.4 94.2  PLT 158 159 151*   Basic Metabolic Panel: Recent Labs  Lab 12/02/17 2017 12/03/17 0011 12/03/17 0458  NA 140  --  139  K 4.7  --  3.7  CL 105  --  111  CO2 24  --  22  GLUCOSE 132*  --  139*  BUN 19  --  20  CREATININE 2.02* 2.33*  2.02*  CALCIUM 9.6  --  7.5*   GFR: Estimated Creatinine Clearance: 50.6 mL/min (A) (by C-G formula based on SCr of 2.02 mg/dL (H)). Liver Function Tests: Recent Labs  Lab 12/02/17 2017 12/03/17 0458  AST 42* 23  ALT 24 15*  ALKPHOS 93 55  BILITOT 1.0 0.6  PROT 7.1 4.7*  ALBUMIN 4.4 2.7*   No results for input(s): LIPASE, AMYLASE in the last 168 hours. No results for  input(s): AMMONIA in the last 168 hours. Coagulation Profile: No results for input(s): INR, PROTIME in the last 168 hours. Cardiac Enzymes: No results for input(s): CKTOTAL, CKMB, CKMBINDEX, TROPONINI in the last 168 hours. BNP (last 3 results) Recent Labs    09/14/17 1527  PROBNP 57   HbA1C: No results for input(s): HGBA1C in the last 72 hours. CBG: Recent Labs  Lab 12/02/17 2343 12/03/17 0720  GLUCAP 140* 115*   Lipid Profile: No results for input(s): CHOL, HDL, LDLCALC, TRIG, CHOLHDL, LDLDIRECT in the last 72 hours. Thyroid Function Tests: Recent Labs    12/03/17 0011  TSH 1.212   Anemia Panel: No results for input(s): VITAMINB12, FOLATE, FERRITIN, TIBC, IRON, RETICCTPCT in the last 72 hours. Sepsis Labs: Recent Labs  Lab 12/02/17 2028 12/02/17 2251 12/03/17 0011 12/03/17 0458  PROCALCITON  --   --  16.95 17.77  LATICACIDVEN 3.63* 2.45*  --   --     No results found for this or any previous visit (from the past 240 hour(s)).       Radiology Studies: Dg Chest 2 View  Result Date: 12/02/2017 CLINICAL DATA:  Chills and leg pain EXAM: CHEST  2 VIEW COMPARISON:  10/12/2017 FINDINGS: Low lung volumes. Patchy atelectasis versus minimal infiltrate at the left base. No pleural effusion. Stable cardiomediastinal silhouette. No pneumothorax. IMPRESSION: Low lung volumes with patchy atelectasis versus minimal infiltrate at the left base. Electronically Signed   By: Donavan Foil M.D.   On: 12/02/2017 19:24        Scheduled Meds: . aspirin EC  81 mg Oral Daily  . atorvastatin  20 mg Oral QHS  . cholecalciferol  5,000 Units Oral Daily  . docusate sodium  100 mg Oral Daily  . heparin  5,000 Units Subcutaneous Q8H  . insulin aspart  0-5 Units Subcutaneous QHS  . insulin aspart  0-9 Units Subcutaneous TID WC  . ipratropium-albuterol  3 mL Nebulization TID  . lamoTRIgine  50 mg Oral BID  . levothyroxine  100 mcg Oral QAC breakfast  . polyethylene glycol  17 g Oral  Daily  . senna  1 tablet Oral Daily   Continuous Infusions: . sodium chloride 100 mL/hr at 12/03/17 0129  . piperacillin-tazobactam (ZOSYN)  IV 3.375 g (12/03/17 0533)  . [START ON 12/04/2017] vancomycin       LOS: 1 day        Aline August, MD [facility] Pager [phone]  If 7PM-7AM, please contact night-coverage [email or web address] Password [email or web address] 12/03/2017, 9:20 AM

## 2017-12-03 NOTE — Progress Notes (Signed)
*  Preliminary Results* Bilateral lower extremity venous duplex completed. Bilateral lower extremities are negative for deep vein thrombosis. There is no evidence of Baker's cyst bilaterally.  No change when compared to prior studies from 02/15/16, 06/03/16, 10/03/17.  12/03/2017 11:59 AM Maudry Mayhew, BS, RVT, RDCS, RDMS

## 2017-12-04 ENCOUNTER — Inpatient Hospital Stay (HOSPITAL_COMMUNITY): Payer: Medicare Other

## 2017-12-04 DIAGNOSIS — R945 Abnormal results of liver function studies: Secondary | ICD-10-CM

## 2017-12-04 LAB — CBC WITH DIFFERENTIAL/PLATELET
BASOS ABS: 0 10*3/uL (ref 0.0–0.1)
BASOS PCT: 0 %
Eosinophils Absolute: 0.2 10*3/uL (ref 0.0–0.7)
Eosinophils Relative: 2 %
HCT: 35.2 % — ABNORMAL LOW (ref 39.0–52.0)
Hemoglobin: 11.4 g/dL — ABNORMAL LOW (ref 13.0–17.0)
LYMPHS PCT: 11 %
Lymphs Abs: 1.3 10*3/uL (ref 0.7–4.0)
MCH: 30.5 pg (ref 26.0–34.0)
MCHC: 32.4 g/dL (ref 30.0–36.0)
MCV: 94.1 fL (ref 78.0–100.0)
MONO ABS: 0.8 10*3/uL (ref 0.1–1.0)
Monocytes Relative: 7 %
NEUTROS ABS: 9.4 10*3/uL — AB (ref 1.7–7.7)
Neutrophils Relative %: 80 %
Platelets: 128 10*3/uL — ABNORMAL LOW (ref 150–400)
RBC: 3.74 MIL/uL — AB (ref 4.22–5.81)
RDW: 14.4 % (ref 11.5–15.5)
WBC: 11.7 10*3/uL — AB (ref 4.0–10.5)

## 2017-12-04 LAB — COMPREHENSIVE METABOLIC PANEL
ALBUMIN: 3 g/dL — AB (ref 3.5–5.0)
ALT: 197 U/L — AB (ref 17–63)
AST: 276 U/L — AB (ref 15–41)
Alkaline Phosphatase: 129 U/L — ABNORMAL HIGH (ref 38–126)
Anion gap: 6 (ref 5–15)
BUN: 23 mg/dL — AB (ref 6–20)
CHLORIDE: 109 mmol/L (ref 101–111)
CO2: 25 mmol/L (ref 22–32)
CREATININE: 2.16 mg/dL — AB (ref 0.61–1.24)
Calcium: 8.7 mg/dL — ABNORMAL LOW (ref 8.9–10.3)
GFR calc Af Amer: 35 mL/min — ABNORMAL LOW (ref >60)
GFR, EST NON AFRICAN AMERICAN: 31 mL/min — AB (ref >60)
GLUCOSE: 111 mg/dL — AB (ref 65–99)
POTASSIUM: 3.9 mmol/L (ref 3.5–5.1)
Sodium: 140 mmol/L (ref 135–145)
Total Bilirubin: 1.8 mg/dL — ABNORMAL HIGH (ref 0.3–1.2)
Total Protein: 5.3 g/dL — ABNORMAL LOW (ref 6.5–8.1)

## 2017-12-04 LAB — GLUCOSE, CAPILLARY
Glucose-Capillary: 125 mg/dL — ABNORMAL HIGH (ref 65–99)
Glucose-Capillary: 135 mg/dL — ABNORMAL HIGH (ref 65–99)
Glucose-Capillary: 122 mg/dL — ABNORMAL HIGH (ref 65–99)
Glucose-Capillary: 133 mg/dL — ABNORMAL HIGH (ref 65–99)

## 2017-12-04 LAB — MAGNESIUM: Magnesium: 1.8 mg/dL (ref 1.7–2.4)

## 2017-12-04 LAB — PROCALCITONIN: PROCALCITONIN: 14.56 ng/mL

## 2017-12-04 NOTE — Progress Notes (Signed)
Patient ID: [name], male   DOB: [birth year], 67 y.o.   MRN: [id number]  PROGRESS NOTE    KORVER GRAYBEAL  JJK:[id number] DOB: 21-Dec-1950 DOA: 12/02/2017 PCP: [name], FNP   Brief Narrative:  67 year old male with history of schizophrenia, CKD stage III, CLL/non-Hodgkin's lymphoma, prolonged QT syndrome, hypertension, hyperlipidemia, hypothyroidism, COPD was sent to the hospital for evaluation of lower extremity pain and swelling.  Patient was admitted with right lower extremity cellulitis and pneumonia and started on antibiotics.   Assessment & Plan:   Active Problems:   Cellulitis   Sepsis secondary to right lower extremity cellulitis and Healthcare acquired pneumonia -Hemodynamically improving.  Cultures negative so far.  Antibiotic plan as below -Continue IV fluids  Right lower extremity cellulitis -Continue vancomycin and Zosyn. -Lower extremity duplex was negative for DVT  Healthcare acquired pneumonia -With a concern for gram-negative rods versus MRSA pneumonia -Follow cultures.  Continue current antibiotics  Leukocytosis -Probably second above.  Improving  Elevated LFTs -Questionable cause.  Right upper quadrant ultrasound and hepatitis panel -Repeat a.m. LFTs  History of schizophrenia -Continue home medications -Out patient follow-up with psychiatry  history of prolonged QT interval - Currently on antipsychotic meds.  Benefit outweighs the risk.    Monitor.  Chronic kidney disease stage III -Creatinine stable.  Repeat a.m. Labs  History of hyperlipidemia -Continue home meds  Hypothyroidism -Continue Synthroid  Morbid obesity -Outpatient follow-up  DVT prophylaxis: Heparin Code Status: Full Family Communication: None at bedside Disposition Plan: Depends on clinical outcome  Consultants: None  Procedures: None  Antimicrobials: Vancomycin and Zosyn from 12/02/2017 onwards   Subjective: Patient seen and examined at bedside.  He  feels slightly better.  His breathing is improving.  No overnight fever or vomiting.  Objective: Vitals:   12/03/17 1353 12/03/17 2006 12/04/17 0429 12/04/17 1306  BP: 100/79 (!) 107/59 120/62 (!) 124/57  Pulse: 94 93 99 87  Resp: 18 16 16 16   Temp: 98.9 F (37.2 C) 98 F (36.7 C) 98.1 F (36.7 C) 98 F (36.7 C)  TempSrc: Oral Oral Oral Oral  SpO2: 96% 97% 98% 100%  Weight: 129.3 kg (285 lb)     Height: 6' (1.829 m)       Intake/Output Summary (Last 24 hours) at 12/04/2017 1312 Last data filed at 12/04/2017 1300 Gross per 24 hour  Intake 450 ml  Output 2300 ml  Net -1850 ml   Filed Weights   12/02/17 1724 12/03/17 1353  Weight: 129.3 kg (285 lb) 129.3 kg (285 lb)    Examination:  General exam: Appears calm and comfortable.  No distress Respiratory system: Bilateral decreased breath sound at bases with some scattered crackles Cardiovascular system: S1 & S2 heard, rate controlled.  Gastrointestinal system: Abdomen is morbidly obese, distended, soft and nontender. Normal bowel sounds heard. Extremities: No cyanosis, clubbing; right lower extremity with mild redness and swelling and nonpitting edema but no open wounds or ulcers.  Data Reviewed: I have personally reviewed following labs and imaging studies  CBC: Recent Labs  Lab 12/02/17 2017 12/03/17 0011 12/03/17 0458 12/04/17 0342  WBC 22.2* 29.9* 23.3* 11.7*  NEUTROABS 20.0*  --   --  9.4*  HGB 14.1 12.9* 10.4* 11.4*  HCT 41.9 38.8* 30.8* 35.2*  MCV 93.7 94.4 94.2 94.1  PLT 158 159 138* 371*   Basic Metabolic Panel: Recent Labs  Lab 12/02/17 2017 12/03/17 0011 12/03/17 0458 12/04/17 0342  NA 140  --  139 140  K 4.7  --  3.7 3.9  CL 105  --  111 109  CO2 24  --  22 25  GLUCOSE 132*  --  139* 111*  BUN 19  --  20 23*  CREATININE 2.02* 2.33* 2.02* 2.16*  CALCIUM 9.6  --  7.5* 8.7*  MG  --   --   --  1.8   GFR: Estimated Creatinine Clearance: 48 mL/min (A) (by C-G formula based on SCr of 2.16 mg/dL  (H)). Liver Function Tests: Recent Labs  Lab 12/02/17 2017 12/03/17 0458 12/04/17 0342  AST 42* 23 276*  ALT 24 15* 197*  ALKPHOS 93 55 129*  BILITOT 1.0 0.6 1.8*  PROT 7.1 4.7* 5.3*  ALBUMIN 4.4 2.7* 3.0*   No results for input(s): LIPASE, AMYLASE in the last 168 hours. No results for input(s): AMMONIA in the last 168 hours. Coagulation Profile: No results for input(s): INR, PROTIME in the last 168 hours. Cardiac Enzymes: No results for input(s): CKTOTAL, CKMB, CKMBINDEX, TROPONINI in the last 168 hours. BNP (last 3 results) Recent Labs    09/14/17 1527  PROBNP 57   HbA1C: No results for input(s): HGBA1C in the last 72 hours. CBG: Recent Labs  Lab 12/03/17 1201 12/03/17 1719 12/03/17 2115 12/04/17 0733 12/04/17 1143  GLUCAP 114* 131* 194* 125* 135*   Lipid Profile: No results for input(s): CHOL, HDL, LDLCALC, TRIG, CHOLHDL, LDLDIRECT in the last 72 hours. Thyroid Function Tests: Recent Labs    12/03/17 0011  TSH 1.212   Anemia Panel: No results for input(s): VITAMINB12, FOLATE, FERRITIN, TIBC, IRON, RETICCTPCT in the last 72 hours. Sepsis Labs: Recent Labs  Lab 12/02/17 2028 12/02/17 2251 12/03/17 0011 12/03/17 0458 12/04/17 0342  PROCALCITON  --   --  16.95 17.77 14.56  LATICACIDVEN 3.63* 2.45*  --   --   --     Recent Results (from the past 240 hour(s))  MRSA PCR Screening     Status: None   Collection Time: 12/03/17  4:26 PM  Result Value Ref Range Status   MRSA by PCR NEGATIVE NEGATIVE Final    Comment:        The GeneXpert MRSA Assay (FDA approved for NASAL specimens only), is one component of a comprehensive MRSA colonization surveillance program. It is not intended to diagnose MRSA infection nor to guide or monitor treatment for MRSA infections.          Radiology Studies: Dg Chest 2 View  Result Date: 12/02/2017 CLINICAL DATA:  Chills and leg pain EXAM: CHEST  2 VIEW COMPARISON:  10/12/2017 FINDINGS: Low lung volumes.  Patchy atelectasis versus minimal infiltrate at the left base. No pleural effusion. Stable cardiomediastinal silhouette. No pneumothorax. IMPRESSION: Low lung volumes with patchy atelectasis versus minimal infiltrate at the left base. Electronically Signed   By: Donavan Foil M.D.   On: 12/02/2017 19:24        Scheduled Meds: . aspirin EC  81 mg Oral Daily  . cholecalciferol  5,000 Units Oral Daily  . docusate sodium  100 mg Oral Daily  . heparin  5,000 Units Subcutaneous Q8H  . insulin aspart  0-5 Units Subcutaneous QHS  . insulin aspart  0-9 Units Subcutaneous TID WC  . lamoTRIgine  50 mg Oral BID  . levothyroxine  100 mcg Oral QAC breakfast  . polyethylene glycol  17 g Oral Daily  . senna  1 tablet Oral Daily   Continuous Infusions: . sodium chloride 100 mL/hr at 12/03/17 1626  . piperacillin-tazobactam (ZOSYN)  IV Stopped (12/04/17 1049)  . vancomycin       LOS: 2 days        Aline August, MD [facility] Pager [phone]  If 7PM-7AM, please contact night-coverage [email or web address] Password [email or web address] 12/04/2017, 1:12 PM

## 2017-12-05 LAB — COMPREHENSIVE METABOLIC PANEL
ALBUMIN: 3 g/dL — AB (ref 3.5–5.0)
ALK PHOS: 154 U/L — AB (ref 38–126)
ALT: 210 U/L — ABNORMAL HIGH (ref 17–63)
AST: 191 U/L — ABNORMAL HIGH (ref 15–41)
Anion gap: 5 (ref 5–15)
BILIRUBIN TOTAL: 1.7 mg/dL — AB (ref 0.3–1.2)
BUN: 17 mg/dL (ref 6–20)
CALCIUM: 9 mg/dL (ref 8.9–10.3)
CO2: 25 mmol/L (ref 22–32)
Chloride: 113 mmol/L — ABNORMAL HIGH (ref 101–111)
Creatinine, Ser: 1.75 mg/dL — ABNORMAL HIGH (ref 0.61–1.24)
GFR calc non Af Amer: 39 mL/min — ABNORMAL LOW (ref >60)
GFR, EST AFRICAN AMERICAN: 46 mL/min — AB (ref >60)
GLUCOSE: 117 mg/dL — AB (ref 65–99)
Potassium: 4.2 mmol/L (ref 3.5–5.1)
Sodium: 143 mmol/L (ref 135–145)
TOTAL PROTEIN: 5.7 g/dL — AB (ref 6.5–8.1)

## 2017-12-05 LAB — CBC WITH DIFFERENTIAL/PLATELET
Basophils Absolute: 0 10*3/uL (ref 0.0–0.1)
Basophils Relative: 0 %
Eosinophils Absolute: 0.3 10*3/uL (ref 0.0–0.7)
Eosinophils Relative: 4 %
HEMATOCRIT: 34.4 % — AB (ref 39.0–52.0)
HEMOGLOBIN: 11.1 g/dL — AB (ref 13.0–17.0)
LYMPHS ABS: 1.6 10*3/uL (ref 0.7–4.0)
LYMPHS PCT: 21 %
MCH: 30.7 pg (ref 26.0–34.0)
MCHC: 32.3 g/dL (ref 30.0–36.0)
MCV: 95 fL (ref 78.0–100.0)
Monocytes Absolute: 0.5 10*3/uL (ref 0.1–1.0)
Monocytes Relative: 7 %
NEUTROS ABS: 5.2 10*3/uL (ref 1.7–7.7)
NEUTROS PCT: 68 %
Platelets: 138 10*3/uL — ABNORMAL LOW (ref 150–400)
RBC: 3.62 MIL/uL — ABNORMAL LOW (ref 4.22–5.81)
RDW: 14.4 % (ref 11.5–15.5)
WBC: 7.7 10*3/uL (ref 4.0–10.5)

## 2017-12-05 LAB — GLUCOSE, CAPILLARY
Glucose-Capillary: 115 mg/dL — ABNORMAL HIGH (ref 65–99)
Glucose-Capillary: 112 mg/dL — ABNORMAL HIGH (ref 65–99)
Glucose-Capillary: 138 mg/dL — ABNORMAL HIGH (ref 65–99)
Glucose-Capillary: 132 mg/dL — ABNORMAL HIGH (ref 65–99)

## 2017-12-05 LAB — MAGNESIUM: Magnesium: 2.1 mg/dL (ref 1.7–2.4)

## 2017-12-05 MED ORDER — DEXTROSE 5 % IV SOLN
2.0000 g | INTRAVENOUS | Status: DC
  Administered 2017-12-05 – 2017-12-06 (×2): 2 g via INTRAVENOUS
  Filled 2017-12-05 (×2): qty 2

## 2017-12-05 NOTE — Progress Notes (Signed)
Patient ID: ABDOULIE TIERCE, male   DOB: [birth year], 67 y.o.   MRN: [id number]  PROGRESS NOTE    RAEKWAN SPELMAN  ZMO:[id number] DOB: Aug 06, 1951 DOA: 12/02/2017 PCP: [name], FNP   Brief Narrative:  67 year old male with history of schizophrenia, CKD stage III, CLL/non-Hodgkin's lymphoma, prolonged QT syndrome, hypertension, hyperlipidemia, hypothyroidism, COPD was sent to the hospital for evaluation of lower extremity pain and swelling.  Patient was admitted with right lower extremity cellulitis and pneumonia and started on antibiotics.   Assessment & Plan:   Active Problems:   Cellulitis   Sepsis secondary to right lower extremity cellulitis and Healthcare acquired pneumonia -Hemodynamically improving.  Cultures negative so far.  Antibiotic plan as below -Continue IV fluids  Right lower extremity cellulitis -Improving. Doubt that he has MRSA cellulitis. Switch antibiotics to Rocephin. -Lower extremity duplex was negative for DVT  Healthcare acquired pneumonia -doubt that he has MRSA pneumonia -Cultures negative so far.  Switch antibiotics to Rocephin.  Leukocytosis -Probably secondary to  above.  Resolved.  Elevated LFTs -Questionable cause.  Right upper quadrant ultrasound showed sludging but no gallstones and no CBD dilatation. - LFTs improving -Repeat a.m. LFTs  History of schizophrenia -We will restart home meds on discharge -Out patient follow-up with psychiatry  history of prolonged QT interval -Monitor.  Chronic kidney disease stage III -Creatinine stable.  Repeat a.m. Labs  History of hyperlipidemia -Statin held because of elevated LFTs  Hypothyroidism -Continue Synthroid  Morbid obesity -Outpatient follow-up  DVT prophylaxis: Heparin Code Status: Full Family Communication: None at bedside Disposition Plan: Probable discharge in 1-2 days  Consultants: None  Procedures: None  Antimicrobials: Vancomycin and Zosyn from 12/02/2017  onwards   Subjective: Patient seen and examined at bedside.  He is a poor historian but he feels slightly better.  His breathing is improving.  No overnight fever or vomiting.  Objective: Vitals:   12/04/17 0429 12/04/17 1306 12/04/17 1949 12/05/17 0421  BP: 120/62 (!) 124/57 134/68 130/64  Pulse: 99 87 80 81  Resp: 16 16 18 16   Temp: 98.1 F (36.7 C) 98 F (36.7 C) 97.7 F (36.5 C) 97.9 F (36.6 C)  TempSrc: Oral Oral Oral Oral  SpO2: 98% 100% 100% 100%  Weight:    129.3 kg (285 lb 0.9 oz)  Height:        Intake/Output Summary (Last 24 hours) at 12/05/2017 0944 Last data filed at 12/05/2017 0854 Gross per 24 hour  Intake 540 ml  Output 2000 ml  Net -1460 ml   Filed Weights   12/02/17 1724 12/03/17 1353 12/05/17 0421  Weight: 129.3 kg (285 lb) 129.3 kg (285 lb) 129.3 kg (285 lb 0.9 oz)    Examination:  General exam: Appears calm and comfortable.  Respiratory system: Bilateral decreased breath sound at bases with some scattered crackles Cardiovascular system: S1 & S2 heard, rate controlled.  Gastrointestinal system: Abdomen is morbidly obese, distended, soft and nontender. Normal bowel sounds heard. Extremities: No cyanosis, clubbing; right lower extremity with mild redness and swelling and nonpitting edema but no open wounds or ulcers.  Data Reviewed: I have personally reviewed following labs and imaging studies  CBC: Recent Labs  Lab 12/02/17 2017 12/03/17 0011 12/03/17 0458 12/04/17 0342 12/05/17 0255  WBC 22.2* 29.9* 23.3* 11.7* 7.7  NEUTROABS 20.0*  --   --  9.4* 5.2  HGB 14.1 12.9* 10.4* 11.4* 11.1*  HCT 41.9 38.8* 30.8* 35.2* 34.4*  MCV 93.7 94.4 94.2 94.1 95.0  PLT 158 159  138* 128* 517*   Basic Metabolic Panel: Recent Labs  Lab 12/02/17 2017 12/03/17 0011 12/03/17 0458 12/04/17 0342 12/05/17 0255  NA 140  --  139 140 143  K 4.7  --  3.7 3.9 4.2  CL 105  --  111 109 113*  CO2 24  --  22 25 25   GLUCOSE 132*  --  139* 111* 117*  BUN 19  --   20 23* 17  CREATININE 2.02* 2.33* 2.02* 2.16* 1.75*  CALCIUM 9.6  --  7.5* 8.7* 9.0  MG  --   --   --  1.8 2.1   GFR: Estimated Creatinine Clearance: 59.3 mL/min (A) (by C-G formula based on SCr of 1.75 mg/dL (H)). Liver Function Tests: Recent Labs  Lab 12/02/17 2017 12/03/17 0458 12/04/17 0342 12/05/17 0255  AST 42* 23 276* 191*  ALT 24 15* 197* 210*  ALKPHOS 93 55 129* 154*  BILITOT 1.0 0.6 1.8* 1.7*  PROT 7.1 4.7* 5.3* 5.7*  ALBUMIN 4.4 2.7* 3.0* 3.0*   No results for input(s): LIPASE, AMYLASE in the last 168 hours. No results for input(s): AMMONIA in the last 168 hours. Coagulation Profile: No results for input(s): INR, PROTIME in the last 168 hours. Cardiac Enzymes: No results for input(s): CKTOTAL, CKMB, CKMBINDEX, TROPONINI in the last 168 hours. BNP (last 3 results) Recent Labs    09/14/17 1527  PROBNP 57   HbA1C: No results for input(s): HGBA1C in the last 72 hours. CBG: Recent Labs  Lab 12/04/17 0733 12/04/17 1143 12/04/17 1712 12/04/17 2144 12/05/17 0734  GLUCAP 125* 135* 122* 133* 115*   Lipid Profile: No results for input(s): CHOL, HDL, LDLCALC, TRIG, CHOLHDL, LDLDIRECT in the last 72 hours. Thyroid Function Tests: Recent Labs    12/03/17 0011  TSH 1.212   Anemia Panel: No results for input(s): VITAMINB12, FOLATE, FERRITIN, TIBC, IRON, RETICCTPCT in the last 72 hours. Sepsis Labs: Recent Labs  Lab 12/02/17 2028 12/02/17 2251 12/03/17 0011 12/03/17 0458 12/04/17 0342  PROCALCITON  --   --  16.95 17.77 14.56  LATICACIDVEN 3.63* 2.45*  --   --   --     Recent Results (from the past 240 hour(s))  Culture, blood (Routine X 2) w Reflex to ID Panel     Status: None (Preliminary result)   Collection Time: 12/03/17 12:11 AM  Result Value Ref Range Status   Specimen Description BLOOD LEFT FOREARM  Final   Special Requests IN PEDIATRIC BOTTLE Blood Culture adequate volume  Final   Culture   Final    NO GROWTH 1 DAY Performed at [facility], [place] [address], [place], [place] [address]    Report Status PENDING  Incomplete  Culture, blood (Routine X 2) w Reflex to ID Panel     Status: None (Preliminary result)   Collection Time: 12/03/17 12:11 AM  Result Value Ref Range Status   Specimen Description BLOOD RIGHT WRIST  Final   Special Requests IN PEDIATRIC BOTTLE Blood Culture adequate volume  Final   Culture   Final    NO GROWTH 1 DAY Performed at [facility], [place] [address] [place],  [address]    Report Status PENDING  Incomplete  MRSA PCR Screening     Status: None   Collection Time: 12/03/17  4:26 PM  Result Value Ref Range Status   MRSA by PCR NEGATIVE NEGATIVE Final    Comment:        The GeneXpert MRSA  Assay (FDA approved for NASAL specimens only), is one component of a comprehensive MRSA colonization surveillance program. It is not intended to diagnose MRSA infection nor to guide or monitor treatment for MRSA infections.          Radiology Studies: US Abdomen Limited  Result Date: 12/04/2017 CLINICAL DATA:  Elevated LFTs EXAM: ULTRASOUND ABDOMEN LIMITED RIGHT UPPER QUADRANT COMPARISON:  None. FINDINGS: Gallbladder: There is sludge in the gallbladder. No stones, Murphy's sign, wall thickening, or pericholecystic fluid. Common bile duct: Diameter: 3.7 mm Liver: Diffuse increased echogenicity with no focal mass. Portal vein is patent on color Doppler imaging with normal direction of blood flow towards the liver. IMPRESSION: 1. Sludge in the gallbladder.  The gallbladder is otherwise normal. 2. Probable hepatic steatosis. 3. Trace ascites adjacent to the liver and right kidney. Electronically Signed   By: Dorise Bullion III M.D   On: 12/04/2017 16:59        Scheduled Meds: . aspirin EC  81 mg Oral Daily  . cholecalciferol  5,000 Units Oral Daily  . docusate sodium  100 mg Oral Daily  . heparin  5,000 Units Subcutaneous Q8H  . insulin aspart  0-5 Units Subcutaneous QHS  . insulin  aspart  0-9 Units Subcutaneous TID WC  . lamoTRIgine  50 mg Oral BID  . levothyroxine  100 mcg Oral QAC breakfast  . polyethylene glycol  17 g Oral Daily  . senna  1 tablet Oral Daily   Continuous Infusions: . piperacillin-tazobactam (ZOSYN)  IV 3.375 g (12/05/17 0528)  . vancomycin Stopped (12/04/17 2018)     LOS: 3 days        Aline August, MD [facility] Pager [phone]  If 7PM-7AM, please contact night-coverage [email or web address] Password [facility] 12/05/2017, 9:44 AM

## 2017-12-06 ENCOUNTER — Institutional Professional Consult (permissible substitution): Payer: Self-pay | Admitting: Internal Medicine

## 2017-12-06 LAB — CBC WITH DIFFERENTIAL/PLATELET
BASOS ABS: 0 10*3/uL (ref 0.0–0.1)
BASOS PCT: 1 %
EOS ABS: 0.4 10*3/uL (ref 0.0–0.7)
Eosinophils Relative: 5 %
HCT: 36.5 % — ABNORMAL LOW (ref 39.0–52.0)
HEMOGLOBIN: 12 g/dL — AB (ref 13.0–17.0)
Lymphocytes Relative: 22 %
Lymphs Abs: 1.7 10*3/uL (ref 0.7–4.0)
MCH: 31 pg (ref 26.0–34.0)
MCHC: 32.9 g/dL (ref 30.0–36.0)
MCV: 94.3 fL (ref 78.0–100.0)
Monocytes Absolute: 0.6 10*3/uL (ref 0.1–1.0)
Monocytes Relative: 7 %
NEUTROS PCT: 65 %
Neutro Abs: 5 10*3/uL (ref 1.7–7.7)
Platelets: 167 10*3/uL (ref 150–400)
RBC: 3.87 MIL/uL — AB (ref 4.22–5.81)
RDW: 14.1 % (ref 11.5–15.5)
WBC: 7.6 10*3/uL (ref 4.0–10.5)

## 2017-12-06 LAB — COMPREHENSIVE METABOLIC PANEL
ALBUMIN: 3.2 g/dL — AB (ref 3.5–5.0)
ALT: 156 U/L — ABNORMAL HIGH (ref 17–63)
AST: 87 U/L — AB (ref 15–41)
Alkaline Phosphatase: 160 U/L — ABNORMAL HIGH (ref 38–126)
Anion gap: 6 (ref 5–15)
BILIRUBIN TOTAL: 1.2 mg/dL (ref 0.3–1.2)
BUN: 18 mg/dL (ref 6–20)
CALCIUM: 9.4 mg/dL (ref 8.9–10.3)
CO2: 24 mmol/L (ref 22–32)
Chloride: 109 mmol/L (ref 101–111)
Creatinine, Ser: 1.81 mg/dL — ABNORMAL HIGH (ref 0.61–1.24)
GFR calc Af Amer: 44 mL/min — ABNORMAL LOW (ref >60)
GFR calc non Af Amer: 38 mL/min — ABNORMAL LOW (ref >60)
GLUCOSE: 113 mg/dL — AB (ref 65–99)
Potassium: 3.9 mmol/L (ref 3.5–5.1)
SODIUM: 139 mmol/L (ref 135–145)
Total Protein: 5.8 g/dL — ABNORMAL LOW (ref 6.5–8.1)

## 2017-12-06 LAB — EXPECTORATED SPUTUM ASSESSMENT W REFEX TO RESP CULTURE

## 2017-12-06 LAB — GLUCOSE, CAPILLARY
GLUCOSE-CAPILLARY: 118 mg/dL — AB (ref 65–99)
GLUCOSE-CAPILLARY: 132 mg/dL — AB (ref 65–99)

## 2017-12-06 LAB — MAGNESIUM: Magnesium: 1.8 mg/dL (ref 1.7–2.4)

## 2017-12-06 LAB — EXPECTORATED SPUTUM ASSESSMENT W GRAM STAIN, RFLX TO RESP C

## 2017-12-06 MED ORDER — CEFUROXIME AXETIL 500 MG PO TABS: 500.0000 mg | ORAL_TABLET | Freq: Two times a day (BID) | ORAL | 0 refills | Status: AC

## 2017-12-06 MED ORDER — LORATADINE 10 MG PO TABS
10.0000 mg | ORAL_TABLET | Freq: Every day | ORAL | Status: DC
  Administered 2017-12-06: 10 mg via ORAL
  Filled 2017-12-06 (×2): qty 1

## 2017-12-06 MED ORDER — SALINE SPRAY 0.65 % NA SOLN
1.0000 | NASAL | Status: DC | PRN
  Filled 2017-12-06: qty 44

## 2017-12-06 NOTE — Clinical Social Work Note (Signed)
Clinical Social Work Assessment  Patient Details  Name: [name] MRN: [id number] Date of Birth: 02-12-1951  Date of referral:  12/06/17               Reason for consult:  Facility Placement                Permission sought to share information with:  Facility Art therapist granted to share information::     Name::        Agency::     Relationship::  Nurse, learning disability Information:     Housing/Transportation Living arrangements for the past 2 months:  [place] of Information:  Other (Comment Required)([name] staff) Patient Interpreter Needed:  None Criminal Activity/Legal Involvement Pertinent to Current Situation/Hospitalization:  No - Comment as needed Significant Relationships:  Warehouse manager Lives with:  Facility Resident Do you feel safe going back to the place where you live?  No Need for family participation in patient care:  No (Coment)  Care giving concerns:   Medical concern: Lower extremity pain and swelling. Patient will return to [facility] on PO anabiotics.   Social Worker assessment / plan:  CSW discussed discharge plan-return to Group Home([facility]) w/ case Freight forwarder. She will arrange for patient to return with Home Health.  CSW confirmed with [name] care Freight forwarder at facility, the patient is able to return. They will not need and updated FL2 is since there are no major changes. CSW faxed d/c summary via [facility] and arranged for [facility] transport.   Plan: Patient to return to Group Home   Employment status:  Disabled (Comment on whether or not currently receiving Disability) Insurance information:  Medicare, Medicaid [place] PT Recommendations:  No Follow Up Information / Referral to community resources:     Patient/Family's Response to care: No family at bedside. Patient responding to care, agreeable to return to [facility].   Patient/Family's Understanding of and Emotional Response to Diagnosis,  Current Treatment, and Prognosis:  No family at bedside. Patient Group Home members are involved and Knowledgable of patient care. Patient has delayed in responses.   Emotional Assessment Appearance:  Appears stated age Attitude/Demeanor/Rapport:    Affect (typically observed):  Accepting Orientation:  Oriented to Self, Oriented to Place, Oriented to  Time, Oriented to Situation Alcohol / Substance use:  Not Applicable Psych involvement (Current and /or in the community):  No (Comment)  Discharge Needs  Concerns to be addressed:  Discharge Planning Concerns Readmission within the last 30 days:  Yes Current discharge risk:  Dependent with Mobility, Cognitively Impaired(Delayed Responses) Barriers to Discharge:  No Barriers Identified   Lia Hopping, LCSW 12/06/2017, 5:25 PM
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
## 2017-12-06 NOTE — Progress Notes (Signed)
[facility] arranged for transport.  [facility]. Form complete.    Kathrin Greathouse, Latanya Presser, MSW Clinical Social Worker  [phone] 12/06/2017  5:33 PM

## 2017-12-06 NOTE — Progress Notes (Signed)
[facility] on unit to transport patient.

## 2017-12-06 NOTE — Progress Notes (Signed)
CM consult for home health PT. This CM contacted pt guardian [name] to offer choice for home health services. Maria directed to call staff at pt facility [facility]. This CM spoke with [name] who states that they use [facility] as a home health provider. This CM contacted [facility] rep for referral. RW to be sent to pt facility as pt is using [facility] to transport back home. Marney Doctor RN,BSN,NCM [phone]

## 2017-12-06 NOTE — Discharge Summary (Addendum)
Physician Discharge Summary  [name] NOM:[id number] DOB: 05-15-1951 DOA: 12/02/2017  PCP: [name], FNP  Admit date: 12/02/2017 Discharge date: 12/06/2017  Admitted From: Group home Disposition: Group home  Recommendations for Outpatient Follow-up:  1. Please follow up PCP with repeat CBC/CMP in 1 week 2. Please follow up with psychiatrist in a week    Home Health: No  Equipment/Devices: None  Discharge Condition: Stable  CODE STATUS: Full Diet recommendation: Heart Healthy  Brief/Interim Summary: 67 year old male with history of schizophrenia, CKD stage III, CLL/non-Hodgkin's lymphoma, prolonged QT syndrome, hypertension, hyperlipidemia, hypothyroidism, COPD was sent to the hospital for evaluation of lower extremity pain and swelling.  Patient was admitted with right lower extremity cellulitis and pneumonia and started on antibiotics. He has tolerated antibiotics. He is afebrile. Cultures have been negative so far. His cellulitis has much improved. He'll be discharged on oral Ceftin for 3 more days.     Discharge Diagnoses:  Active Problems:   Cellulitis   Sepsis secondary to right lower extremity cellulitis and Healthcare acquired pneumonia -Resolved. Treated with antibiotics and fluids. Hemodynamically stable.  Cultures negative so far.   Right lower extremity cellulitis -Much improved. Initially started on vancomycin and Zosyn but switched to Rocephin on 12/05/2017. Discharge on oral Ceftin for 3 more days -Lower extremity duplex was negative for DVT  Healthcare acquired pneumonia -doubt that he has MRSA pneumonia -Cultures negative so far.   antibiotics plan as above -on room air. Respiratory status stable  Leukocytosis -Probably secondary to  above.  Resolved.  Elevated LFTs -Questionable cause.  Right upper quadrant ultrasound showed sludging but no gallstones and no CBD dilatation. - LFTs improving -Statin held. Outpatient follow-up of  LFTs  History of schizophrenia -Resume home meds. Outpatient follow-up with psychiatrist  history of prolonged QT interval -Outpatient follow-up. Cautious use of antipsychotics.  Chronic kidney disease stage III -Creatinine stable.   outpatient follow-up  History of hyperlipidemia -Statin held because of elevated LFTs. 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 - outpatient followup - stable    Discharge Instructions  Discharge Instructions    Call MD for:  difficulty breathing, headache or visual disturbances   Complete by:  As directed    Call MD for:  extreme fatigue   Complete by:  As directed    Call MD for:  hives   Complete by:  As directed    Call MD for:  persistant dizziness or light-headedness   Complete by:  As directed    Call MD for:  persistant nausea and vomiting   Complete by:  As directed    Call MD for:  severe uncontrolled pain   Complete by:  As directed    Call MD for:  temperature >100.4   Complete by:  As directed    Diet - low sodium heart healthy   Complete by:  As directed    Increase activity slowly   Complete by:  As directed      Allergies as of 12/06/2017      Reactions   Sulfamethoxazole Rash      Medication List    STOP taking these medications   acetaminophen 325 MG tablet Commonly known as:  TYLENOL   atorvastatin 20 MG tablet Commonly known as:  LIPITOR     TAKE these medications   aspirin EC 81 MG tablet Take 81 mg by mouth daily.   cefUROXime 500 MG  tablet Commonly known as:  CEFTIN Take 1 tablet (500 mg total) by mouth 2 (two) times daily for 3 days.   chlorproMAZINE 50 MG tablet Commonly known as:  THORAZINE Take 50 mg 4 (four) times daily by mouth.   chlorproMAZINE 100 MG tablet Commonly known as:  THORAZINE Take 100 mg at bedtime by mouth.   cloZAPine 100 MG tablet Commonly known as:   CLOZARIL Take 200 mg by mouth at bedtime. Take with 50 mg tablet for a total of 250 mg nightly   clozapine 50 MG tablet Commonly known as:  CLOZARIL Take 50 mg 3 (three) times daily by mouth.   docusate sodium 100 MG capsule Commonly known as:  COLACE Take 100 mg by mouth daily.   escitalopram 10 MG tablet Commonly known as:  LEXAPRO Take 10 mg by mouth daily before breakfast.   Fish Oil 1000 MG Caps Take 4,000 mg by mouth daily.   lamoTRIgine 25 MG tablet Commonly known as:  LAMICTAL Take 50 mg by mouth 2 (two) times daily.   levothyroxine 100 MCG tablet Commonly known as:  SYNTHROID, LEVOTHROID Take 100 mcg daily by mouth.   Nystatin Powd Apply 1 application 3 (three) times daily as needed topically (Rash).   polyethylene glycol packet Commonly known as:  MIRALAX / GLYCOLAX Take 17 g daily by mouth. Dissolve 17g in 8oz of water/juice and drink by mouth every day   senna 8.6 MG Tabs tablet Commonly known as:  SENOKOT Take 1 tablet by mouth daily.   VITAMIN D-3 PO Take 5,000 mg by mouth daily.      Follow-up Information    [name], [place]. Schedule an appointment as soon as possible for a visit in 1 week(s).   Specialty:  Nurse Practitioner Why:  with repeat CBC/CMP Contact information: [place] [address] [phone]        Psychiatrist. Schedule an appointment as soon as possible for a visit in 1 week(s).          Allergies  Allergen Reactions  . Sulfamethoxazole Rash    Consultations:  None   Procedures/Studies: Dg Chest 2 View  Result Date: 12/02/2017 CLINICAL DATA:  Chills and leg pain EXAM: CHEST  2 VIEW COMPARISON:  10/12/2017 FINDINGS: Low lung volumes. Patchy atelectasis versus minimal infiltrate at the left base. No pleural effusion. Stable cardiomediastinal silhouette. No pneumothorax. IMPRESSION: Low lung volumes with patchy atelectasis versus minimal infiltrate at the left base. Electronically Signed   By:  Donavan Foil M.D.   On: 12/02/2017 19:24   US Abdomen Limited  Result Date: 12/04/2017 CLINICAL DATA:  Elevated LFTs EXAM: ULTRASOUND ABDOMEN LIMITED RIGHT UPPER QUADRANT COMPARISON:  None. FINDINGS: Gallbladder: There is sludge in the gallbladder. No stones, Murphy's sign, wall thickening, or pericholecystic fluid. Common bile duct: Diameter: 3.7 mm Liver: Diffuse increased echogenicity with no focal mass. Portal vein is patent on color Doppler imaging with normal direction of blood flow towards the liver. IMPRESSION: 1. Sludge in the gallbladder.  The gallbladder is otherwise normal. 2. Probable hepatic steatosis. 3. Trace ascites adjacent to the liver and right kidney. Electronically Signed   By: Dorise Bullion III M.D   On: 12/04/2017 16:59      Subjective: Patient seen and examined at bedside. He denies any overnight fever, nausea or vomiting.  Discharge Exam: Vitals:   12/05/17 2040 12/06/17 0556  BP: 137/73 136/76  Pulse: 95 97  Resp: 18 18  Temp: 98.8 F (37.1 C)  98.8 F (37.1 C)  SpO2: 96% 98%   Vitals:   12/05/17 1324 12/05/17 2040 12/06/17 0556 12/06/17 0600  BP: 124/75 137/73 136/76   Pulse: 84 95 97   Resp: 16 18 18    Temp: 98 F (36.7 C) 98.8 F (37.1 C) 98.8 F (37.1 C)   TempSrc: Oral Oral Oral   SpO2: 96% 96% 98%   Weight:    129.2 kg (284 lb 14.4 oz)  Height:        General: Pt is alert, awake, not in acute distress Cardiovascular: Rate controlled, S1/S2 + Respiratory: Bilateral decreased breath sounds at bases Abdominal: Soft, NT, ND, bowel sounds + Extremities: No cyanosis, clubbing; right lower extremity redness has very significantly improved     The results of significant diagnostics from this hospitalization (including imaging, microbiology, ancillary and laboratory) are listed below for reference.     Microbiology: Recent Results (from the past 240 hour(s))  Culture, blood (Routine X 2) w Reflex to ID Panel     Status: None (Preliminary  result)   Collection Time: 12/03/17 12:11 AM  Result Value Ref Range Status   Specimen Description BLOOD LEFT FOREARM  Final   Special Requests IN PEDIATRIC BOTTLE Blood Culture adequate volume  Final   Culture   Final    NO GROWTH 2 DAYS Performed at [facility], [address], [place], [place] [address]    Report Status PENDING  Incomplete  Culture, blood (Routine X 2) w Reflex to ID Panel     Status: None (Preliminary result)   Collection Time: 12/03/17 12:11 AM  Result Value Ref Range Status   Specimen Description BLOOD RIGHT WRIST  Final   Special Requests IN PEDIATRIC BOTTLE Blood Culture adequate volume  Final   Culture   Final    NO GROWTH 2 DAYS Performed at [facility], [place] [address], [place], [place] [address]    Report Status PENDING  Incomplete  MRSA PCR Screening     Status: None   Collection Time: 12/03/17  4:26 PM  Result Value Ref Range Status   MRSA by PCR NEGATIVE NEGATIVE Final    Comment:        The GeneXpert MRSA Assay (FDA approved for NASAL specimens only), is one component of a comprehensive MRSA colonization surveillance program. It is not intended to diagnose MRSA infection nor to guide or monitor treatment for MRSA infections.   Culture, expectorated sputum-assessment     Status: None   Collection Time: 12/05/17 10:00 PM  Result Value Ref Range Status   Specimen Description EXPECTORATED SPUTUM  Final   Special Requests NONE  Final   Sputum evaluation THIS SPECIMEN IS ACCEPTABLE FOR SPUTUM CULTURE  Final   Report Status 12/06/2017 FINAL  Final     Labs: BNP (last 3 results) No results for input(s): BNP in the last 8760 hours. Basic Metabolic Panel: Recent Labs  Lab 12/02/17 2017 12/03/17 0011 12/03/17 0458 12/04/17 0342 12/05/17 0255 12/06/17 0414  NA 140  --  139 140 143 139  K 4.7  --  3.7 3.9 4.2 3.9  CL 105  --  111 109 113* 109  CO2 24  --  22 25 25 24   GLUCOSE 132*  --  139* 111* 117* 113*  BUN 19  --  20  23* 17 18  CREATININE 2.02* 2.33* 2.02* 2.16* 1.75* 1.81*  CALCIUM 9.6  --  7.5* 8.7* 9.0 9.4  MG  --   --   --  1.8 2.1 1.8   Liver Function Tests: Recent Labs  Lab 12/02/17 2017 12/03/17 0458 12/04/17 0342 12/05/17 0255 12/06/17 0414  AST 42* 23 276* 191* 87*  ALT 24 15* 197* 210* 156*  ALKPHOS 93 55 129* 154* 160*  BILITOT 1.0 0.6 1.8* 1.7* 1.2  PROT 7.1 4.7* 5.3* 5.7* 5.8*  ALBUMIN 4.4 2.7* 3.0* 3.0* 3.2*   No results for input(s): LIPASE, AMYLASE in the last 168 hours. No results for input(s): AMMONIA in the last 168 hours. CBC: Recent Labs  Lab 12/02/17 2017 12/03/17 0011 12/03/17 0458 12/04/17 0342 12/05/17 0255 12/06/17 0414  WBC 22.2* 29.9* 23.3* 11.7* 7.7 7.6  NEUTROABS 20.0*  --   --  9.4* 5.2 5.0  HGB 14.1 12.9* 10.4* 11.4* 11.1* 12.0*  HCT 41.9 38.8* 30.8* 35.2* 34.4* 36.5*  MCV 93.7 94.4 94.2 94.1 95.0 94.3  PLT 158 159 138* 128* 138* 167   Cardiac Enzymes: No results for input(s): CKTOTAL, CKMB, CKMBINDEX, TROPONINI in the last 168 hours. BNP: Invalid input(s): POCBNP CBG: Recent Labs  Lab 12/04/17 2144 12/05/17 0734 12/05/17 1154 12/05/17 1634 12/05/17 2038  GLUCAP 133* 115* 112* 138* 132*   D-Dimer No results for input(s): DDIMER in the last 72 hours. Hgb A1c No results for input(s): HGBA1C in the last 72 hours. Lipid Profile No results for input(s): CHOL, HDL, LDLCALC, TRIG, CHOLHDL, LDLDIRECT in the last 72 hours. Thyroid function studies No results for input(s): TSH, T4TOTAL, T3FREE, THYROIDAB in the last 72 hours.  Invalid input(s): FREET3 Anemia work up No results for input(s): VITAMINB12, FOLATE, FERRITIN, TIBC, IRON, RETICCTPCT in the last 72 hours. Urinalysis    Component Value Date/Time   COLORURINE YELLOW 12/03/2017 1017   APPEARANCEUR CLEAR 12/03/2017 1017   LABSPEC 1.014 12/03/2017 1017   PHURINE 5.0 12/03/2017 1017   GLUCOSEU NEGATIVE 12/03/2017 1017   HGBUR NEGATIVE 12/03/2017 1017   BILIRUBINUR NEGATIVE  12/03/2017 1017   KETONESUR NEGATIVE 12/03/2017 1017   PROTEINUR NEGATIVE 12/03/2017 1017   UROBILINOGEN 0.2 05/09/2015 1740   NITRITE NEGATIVE 12/03/2017 1017   LEUKOCYTESUR NEGATIVE 12/03/2017 1017   Sepsis Labs Invalid input(s): PROCALCITONIN,  WBC,  LACTICIDVEN Microbiology Recent Results (from the past 240 hour(s))  Culture, blood (Routine X 2) w Reflex to ID Panel     Status: None (Preliminary result)   Collection Time: 12/03/17 12:11 AM  Result Value Ref Range Status   Specimen Description BLOOD LEFT FOREARM  Final   Special Requests IN PEDIATRIC BOTTLE Blood Culture adequate volume  Final   Culture   Final    NO GROWTH 2 DAYS Performed at [facility], [place] [address] [place], [place] [address]    Report Status PENDING  Incomplete  Culture, blood (Routine X 2) w Reflex to ID Panel     Status: None (Preliminary result)   Collection Time: 12/03/17 12:11 AM  Result Value Ref Range Status   Specimen Description BLOOD RIGHT WRIST  Final   Special Requests IN PEDIATRIC BOTTLE Blood Culture adequate volume  Final   Culture   Final    NO GROWTH 2 DAYS Performed at [facility], [place] [address] [place], [place] [address]    Report Status PENDING  Incomplete  MRSA PCR Screening     Status: None   Collection Time: 12/03/17  4:26 PM  Result Value Ref Range Status   MRSA by PCR NEGATIVE NEGATIVE Final    Comment:        The GeneXpert MRSA  Assay (FDA approved for NASAL specimens only), is one component of a comprehensive MRSA colonization surveillance program. It is not intended to diagnose MRSA infection nor to guide or monitor treatment for MRSA infections.   Culture, expectorated sputum-assessment     Status: None   Collection Time: 12/05/17 10:00 PM  Result Value Ref Range Status   Specimen Description EXPECTORATED SPUTUM  Final   Special Requests NONE  Final   Sputum evaluation THIS SPECIMEN IS ACCEPTABLE FOR SPUTUM CULTURE  Final   Report Status  12/06/2017 FINAL  Final     Time coordinating discharge: 35 minutes  SIGNED:   Aline August, MD  [facility] 12/06/2017, 10:09 AM Pager: [phone]  If 7PM-7AM, please contact night-coverage [email or web address] Password [email or web address]

## 2017-12-07 LAB — HEPATITIS PANEL, ACUTE
HCV Ab: <0.1 s/co ratio (ref 0.0–0.9)
HEP B C IGM: NEGATIVE
HEP B S AG: NEGATIVE
Hep A IgM: NEGATIVE

## 2017-12-08 LAB — CULTURE, BLOOD (ROUTINE X 2)
CULTURE: NO GROWTH
CULTURE: NO GROWTH
SPECIAL REQUESTS: ADEQUATE
Special Requests: ADEQUATE

## 2017-12-08 LAB — CULTURE, RESPIRATORY W GRAM STAIN: Culture: NORMAL

## 2017-12-08 LAB — CULTURE, RESPIRATORY

## 2018-02-19 ENCOUNTER — Inpatient Hospital Stay (HOSPITAL_COMMUNITY)
Admission: EM | Admit: 2018-02-19 | Discharge: 2018-02-21 | DRG: 392 | Disposition: A | Discharge status: Home Health | Payer: Medicare Other | Attending: Internal Medicine | Admitting: Internal Medicine

## 2018-02-19 ENCOUNTER — Encounter (HOSPITAL_COMMUNITY): Payer: Self-pay | Admitting: Emergency Medicine

## 2018-02-19 ENCOUNTER — Emergency Department (HOSPITAL_COMMUNITY): Payer: Medicare Other

## 2018-02-19 ENCOUNTER — Other Ambulatory Visit: Payer: Self-pay

## 2018-02-19 DIAGNOSIS — E0590 Thyrotoxicosis, unspecified without thyrotoxic crisis or storm: Secondary | ICD-10-CM | POA: Diagnosis present

## 2018-02-19 DIAGNOSIS — E785 Hyperlipidemia, unspecified: Secondary | ICD-10-CM | POA: Diagnosis present

## 2018-02-19 DIAGNOSIS — N183 Chronic kidney disease, stage 3 unspecified: Secondary | ICD-10-CM | POA: Diagnosis present

## 2018-02-19 DIAGNOSIS — R402413 Glasgow coma scale score 13-15, at hospital admission: Secondary | ICD-10-CM | POA: Diagnosis present

## 2018-02-19 DIAGNOSIS — I4510 Unspecified right bundle-branch block: Secondary | ICD-10-CM | POA: Diagnosis present

## 2018-02-19 DIAGNOSIS — F200 Paranoid schizophrenia: Secondary | ICD-10-CM | POA: Diagnosis not present

## 2018-02-19 DIAGNOSIS — Z8572 Personal history of non-Hodgkin lymphomas: Secondary | ICD-10-CM | POA: Diagnosis present

## 2018-02-19 DIAGNOSIS — F209 Schizophrenia, unspecified: Secondary | ICD-10-CM | POA: Diagnosis present

## 2018-02-19 DIAGNOSIS — E039 Hypothyroidism, unspecified: Secondary | ICD-10-CM | POA: Diagnosis not present

## 2018-02-19 DIAGNOSIS — Z79899 Other long term (current) drug therapy: Secondary | ICD-10-CM | POA: Diagnosis not present

## 2018-02-19 DIAGNOSIS — Z7982 Long term (current) use of aspirin: Secondary | ICD-10-CM

## 2018-02-19 DIAGNOSIS — Z856 Personal history of leukemia: Secondary | ICD-10-CM

## 2018-02-19 DIAGNOSIS — C8590 Non-Hodgkin lymphoma, unspecified, unspecified site: Secondary | ICD-10-CM | POA: Diagnosis present

## 2018-02-19 DIAGNOSIS — K219 Gastro-esophageal reflux disease without esophagitis: Secondary | ICD-10-CM | POA: Diagnosis present

## 2018-02-19 DIAGNOSIS — K56609 Unspecified intestinal obstruction, unspecified as to partial versus complete obstruction: Secondary | ICD-10-CM | POA: Diagnosis not present

## 2018-02-19 DIAGNOSIS — Z882 Allergy status to sulfonamides status: Secondary | ICD-10-CM

## 2018-02-19 DIAGNOSIS — Z9221 Personal history of antineoplastic chemotherapy: Secondary | ICD-10-CM | POA: Diagnosis not present

## 2018-02-19 DIAGNOSIS — I130 Hypertensive heart and chronic kidney disease with heart failure and stage 1 through stage 4 chronic kidney disease, or unspecified chronic kidney disease: Secondary | ICD-10-CM | POA: Diagnosis present

## 2018-02-19 DIAGNOSIS — I4581 Long QT syndrome: Secondary | ICD-10-CM | POA: Diagnosis present

## 2018-02-19 DIAGNOSIS — I5032 Chronic diastolic (congestive) heart failure: Secondary | ICD-10-CM | POA: Diagnosis present

## 2018-02-19 DIAGNOSIS — R000 Tachycardia, unspecified: Secondary | ICD-10-CM | POA: Diagnosis present

## 2018-02-19 DIAGNOSIS — J449 Chronic obstructive pulmonary disease, unspecified: Secondary | ICD-10-CM | POA: Diagnosis present

## 2018-02-19 DIAGNOSIS — A09 Infectious gastroenteritis and colitis, unspecified: Secondary | ICD-10-CM | POA: Diagnosis present

## 2018-02-19 DIAGNOSIS — K56600 Partial intestinal obstruction, unspecified as to cause: Secondary | ICD-10-CM

## 2018-02-19 LAB — URINALYSIS, ROUTINE W REFLEX MICROSCOPIC
BACTERIA UA: NONE SEEN
BILIRUBIN URINE: NEGATIVE
GLUCOSE, UA: NEGATIVE mg/dL
KETONES UR: NEGATIVE mg/dL
LEUKOCYTES UA: NEGATIVE
NITRITE: NEGATIVE
PH: 5 (ref 5.0–8.0)
Protein, ur: NEGATIVE mg/dL
SPECIFIC GRAVITY, URINE: 1.024 (ref 1.005–1.030)

## 2018-02-19 LAB — COMPREHENSIVE METABOLIC PANEL
ALT: 22 U/L (ref 17–63)
ANION GAP: 10 (ref 5–15)
AST: 30 U/L (ref 15–41)
Albumin: 3.6 g/dL (ref 3.5–5.0)
Alkaline Phosphatase: 72 U/L (ref 38–126)
BUN: 37 mg/dL — ABNORMAL HIGH (ref 6–20)
CHLORIDE: 104 mmol/L (ref 101–111)
CO2: 24 mmol/L (ref 22–32)
Calcium: 8.9 mg/dL (ref 8.9–10.3)
Creatinine, Ser: 2.35 mg/dL — ABNORMAL HIGH (ref 0.61–1.24)
GFR calc Af Amer: 32 mL/min — ABNORMAL LOW (ref >60)
GFR, EST NON AFRICAN AMERICAN: 28 mL/min — AB (ref >60)
Glucose, Bld: 138 mg/dL — ABNORMAL HIGH (ref 65–99)
POTASSIUM: 4.7 mmol/L (ref 3.5–5.1)
SODIUM: 138 mmol/L (ref 135–145)
Total Bilirubin: 1.2 mg/dL (ref 0.3–1.2)
Total Protein: 6.5 g/dL (ref 6.5–8.1)

## 2018-02-19 LAB — CBC
HCT: 46.6 % (ref 39.0–52.0)
Hemoglobin: 15 g/dL (ref 13.0–17.0)
MCH: 30.2 pg (ref 26.0–34.0)
MCHC: 32.2 g/dL (ref 30.0–36.0)
MCV: 93.8 fL (ref 78.0–100.0)
Platelets: 141 10*3/uL — ABNORMAL LOW (ref 150–400)
RBC: 4.97 MIL/uL (ref 4.22–5.81)
RDW: 14.4 % (ref 11.5–15.5)
WBC: 13.9 10*3/uL — ABNORMAL HIGH (ref 4.0–10.5)

## 2018-02-19 LAB — LIPASE, BLOOD: LIPASE: 18 U/L (ref 11–51)

## 2018-02-19 MED ORDER — ALBUTEROL SULFATE HFA 108 (90 BASE) MCG/ACT IN AERS: 2.0000 | INHALATION_SPRAY | RESPIRATORY_TRACT | Status: DC | PRN

## 2018-02-19 MED ORDER — ORAL CARE MOUTH RINSE
15.0000 mL | Freq: Two times a day (BID) | OROMUCOSAL | Status: DC
  Administered 2018-02-19 (×2): 15 mL via OROMUCOSAL

## 2018-02-19 MED ORDER — DEXTROSE-NACL 5-0.45 % IV SOLN
INTRAVENOUS | Status: DC
  Administered 2018-02-19 – 2018-02-20 (×2): via INTRAVENOUS

## 2018-02-19 MED ORDER — LOPERAMIDE HCL 2 MG PO CAPS
2.0000 mg | ORAL_CAPSULE | Freq: Once | ORAL | Status: AC
  Administered 2018-02-19: 2 mg via ORAL
  Filled 2018-02-19: qty 1

## 2018-02-19 MED ORDER — ONDANSETRON HCL 4 MG/2ML IJ SOLN
4.0000 mg | Freq: Once | INTRAMUSCULAR | Status: AC
  Administered 2018-02-19: 4 mg via INTRAVENOUS
  Filled 2018-02-19: qty 2

## 2018-02-19 MED ORDER — HEPARIN SODIUM (PORCINE) 5000 UNIT/ML IJ SOLN
5000.0000 [IU] | Freq: Three times a day (TID) | INTRAMUSCULAR | Status: DC
  Administered 2018-02-19 – 2018-02-21 (×7): 5000 [IU] via SUBCUTANEOUS
  Filled 2018-02-19 (×7): qty 1

## 2018-02-19 MED ORDER — SODIUM CHLORIDE 0.9 % IV BOLUS
1000.0000 mL | Freq: Once | INTRAVENOUS | Status: AC
  Administered 2018-02-19: 1000 mL via INTRAVENOUS

## 2018-02-19 MED ORDER — SODIUM CHLORIDE 0.9 % IV SOLN
25.0000 mg | Freq: Four times a day (QID) | INTRAVENOUS | Status: DC
  Administered 2018-02-19 – 2018-02-20 (×4): 25 mg via INTRAVENOUS
  Filled 2018-02-19 (×5): qty 1

## 2018-02-19 MED ORDER — PROMETHAZINE HCL 25 MG PO TABS: 12.5000 mg | ORAL_TABLET | Freq: Four times a day (QID) | ORAL | Status: DC | PRN

## 2018-02-19 MED ORDER — LEVOTHYROXINE SODIUM 100 MCG IV SOLR
50.0000 ug | Freq: Every day | INTRAVENOUS | Status: DC
  Administered 2018-02-19: 50 ug via INTRAVENOUS
  Filled 2018-02-19 (×2): qty 5

## 2018-02-19 MED ORDER — ONDANSETRON HCL 4 MG/2ML IJ SOLN
4.0000 mg | Freq: Once | INTRAMUSCULAR | Status: DC | PRN
  Filled 2018-02-19: qty 2

## 2018-02-19 MED ORDER — SODIUM CHLORIDE 0.9 % IV SOLN
INTRAVENOUS | Status: DC
  Administered 2018-02-19: 09:00:00 via INTRAVENOUS

## 2018-02-19 MED ORDER — ALBUTEROL SULFATE (2.5 MG/3ML) 0.083% IN NEBU: 2.5000 mg | INHALATION_SOLUTION | RESPIRATORY_TRACT | Status: DC | PRN

## 2018-02-19 NOTE — Progress Notes (Signed)
Pt. arrived to floor from ED via stretcher. Alert and oriented x 4, no respiratory distress noted.

## 2018-02-19 NOTE — ED Notes (Signed)
Bed: IO03 Expected date:  Expected time:  Means of arrival:  Comments: EMS 67 yo male vomiting and diarrhea

## 2018-02-19 NOTE — ED Triage Notes (Addendum)
Per EMS, pt. From [facility], with complaint of vomiting and diarrhea which started yesterday afternoon, no report of fever. No episode of vomiting nor diarrhea reported wile onboard EMS. Pt. Appeared sleepy upon arrival to ED ,reorted that he had all his PM meds prior to coming to ED. No s/s of distress.

## 2018-02-19 NOTE — ED Notes (Signed)
Report given to Monica, RN.

## 2018-02-19 NOTE — ED Provider Notes (Signed)
TIME SEEN: 3:56 AM  CHIEF COMPLAINT: Nausea, vomiting, diarrhea  HPI: Patient is a 67 year old male with history of CLL, hypertension, hyperlipidemia, non-Hodgkin's lymphoma, schizo affective disorder, chronic kidney disease who presents to the emergency department from nursing facility with nausea, vomiting and diarrhea.  Patient is a very poor historian.  He is unable to tell me if he is having any pain.  He is unsure if he has had any fever.  ROS: Level 5 caveat as patient is a very poor historian  PAST MEDICAL HISTORY/PAST SURGICAL HISTORY:  Past Medical History:  Diagnosis Date  . Cellulitis   . CHF (congestive heart failure) ([employer])   . Chronic kidney disease 09/20/2008  . Chronic lymphocytic leukemia ([place])   . cll dx'd 10/2003   no rx  . CLL (chronic lymphoblastic leukemia) 10/2003  . COPD (chronic obstructive pulmonary disease) ([place])   . GERD (gastroesophageal reflux disease)   . Hiatal hernia   . Hyperlipemia   . Hypertension   . Lower extremity edema   . NHL (non-Hodgkin's lymphoma) ([place]) dx'd 08/2008 rt groin   chemo comp 08/2008  . Non Hodgkin's lymphoma ([place]) 08/2008  . Non Hodgkin's lymphoma ([place])   . Paranoid schizophrenia ([place])   . Prolonged Q-T interval on ECG   . RBBB 09/17/2017  . Schizoaffective disorder ([place])   . Sepsis ([place])   . Severe left ventricular hypertrophy 10/21/2017  . Vitamin D deficiency     MEDICATIONS:  Prior to Admission medications   Medication Sig Start Date End Date Taking? Authorizing Provider  acetaminophen (TYLENOL) 325 MG tablet Take 650 mg by mouth every 6 (six) hours as needed for mild pain or moderate pain.   Yes [provider]  albuterol (PROVENTIL HFA;VENTOLIN HFA) 108 (90 Base) MCG/ACT inhaler Inhale 2 puffs into the lungs every 4 (four) hours as needed for wheezing or shortness of breath.   Yes [provider]  aspirin EC 81 MG tablet Take 81 mg by mouth daily.   Yes [provider]  atorvastatin  (LIPITOR) 20 MG tablet Take 20 mg by mouth daily.   Yes [provider]  chlorproMAZINE (THORAZINE) 100 MG tablet Take 100 mg at bedtime by mouth.    Yes [provider]  chlorproMAZINE (THORAZINE) 50 MG tablet Take 50 mg 4 (four) times daily by mouth.    Yes [provider]  Cholecalciferol (VITAMIN D-3 PO) Take 5,000 mg by mouth daily.   Yes [provider]  cloZAPine (CLOZARIL) 100 MG tablet Take 100 mg by mouth 2 (two) times daily.    Yes [provider]  clozapine (CLOZARIL) 50 MG tablet Take 50 mg by mouth at bedtime.    Yes [provider]  docusate sodium (COLACE) 100 MG capsule Take 100 mg by mouth daily.   Yes [provider]  escitalopram (LEXAPRO) 10 MG tablet Take 10 mg by mouth daily before breakfast.    Yes [provider]  lamoTRIgine (LAMICTAL) 25 MG tablet Take 50 mg by mouth 3 (three) times daily.    Yes [provider]  levothyroxine (SYNTHROID, LEVOTHROID) 100 MCG tablet Take 100 mcg daily by mouth.    Yes [provider]  Nystatin POWD Apply 1 application 3 (three) times daily as needed topically (Rash).   Yes [provider]  Omega-3 Fatty Acids (FISH OIL) 1000 MG CAPS Take 4,000 mg by mouth daily.   Yes [provider]  polyethylene glycol (MIRALAX / GLYCOLAX) packet Take 17  g daily by mouth. Dissolve 17g in 8oz of water/juice and drink by mouth every day   Yes [provider]  senna (SENOKOT) 8.6 MG TABS tablet Take 1 tablet by mouth daily.   Yes [provider]    ALLERGIES:  Allergies  Allergen Reactions  . Sulfamethoxazole Rash    SOCIAL HISTORY:  Social History   Tobacco Use  . Smoking status: Never Smoker  . Smokeless tobacco: Never Used  Substance Use Topics  . Alcohol use: No    FAMILY HISTORY: Family History  Problem Relation Age of Onset  . Alcohol abuse Father     EXAM: BP (!) 99/50 (BP Location: Left Arm)   Pulse (!)  102   Temp 97.7 F (36.5 C) (Oral)   Resp 18   SpO2 95%  CONSTITUTIONAL: Alert and oriented to person but does not answer questions appropriately at all times.  He is obese and chronically ill-appearing.  Afebrile. HEAD: Normocephalic EYES: Conjunctivae clear, pupils appear equal, EOMI ENT: normal nose; moist mucous membranes NECK: Supple, no meningismus, no nuchal rigidity, no LAD  CARD: RRR; S1 and S2 appreciated; no murmurs, no clicks, no rubs, no gallops RESP: Normal chest excursion without splinting or tachypnea; breath sounds clear and equal bilaterally; no wheezes, no rhonchi, no rales, no hypoxia or respiratory distress, speaking full sentences ABD/GI: Normal bowel sounds; non-distended; soft, mildly tender to palpation throughout the abdomen, no rebound, no guarding, no peritoneal signs, no hepatosplenomegaly BACK:  The back appears normal and is non-tender to palpation, there is no CVA tenderness EXT: Normal ROM in all joints; non-tender to palpation; no edema; normal capillary refill; no cyanosis, no calf tenderness or swelling    SKIN: Normal color for age and race; warm; no rash NEURO: Moves all extremities equally PSYCH: The patient's mood and manner are appropriate. Grooming and personal hygiene are appropriate.  MEDICAL DECISION MAKING: Patient here with nausea, vomiting and diarrhea.  Seems to be tender throughout the abdomen.  History very limited given patient is a poor historian and nursing staff reports that he received his nighttime medications which include some sedative medications just prior to arrival.  Will obtain labs, urine and a CT of his abdomen pelvis.  We will treat symptomatically with Zofran, Imodium and gentle IV hydration.  Differential includes appendicitis, colitis, bowel obstruction, diverticulitis, cholecystitis, viral illness.  ED PROGRESS: Labs show mild leukocytosis.  Creatinine elevated at 2.3 which is his baseline.  Normal LFTs and lipase.  Urine  shows red blood cells but no other sign of infection.  No bacteria.  CT scan shows multiple dilated loops of small bowel within the mid abdomen and mild surrounding edema which may represent a partial obstruction versus enteritis.  No transition point identified.  He continues to have vomiting in the emergency department.  I feel this could be a partial bowel obstruction.  Will discuss with medicine for admission for observation.   7:16 AM Discussed patient's case with hospitalist, Dr. Sheran Fava.  I have recommended admission and patient (and family if present) agree with this plan. Admitting physician will place admission orders.    Given patient has had multiple episodes of large-volume emesis here we will attempt to place an NG tube as his symptoms are more concerning for partial bowel obstruction.  He has not had any bowel movements in the ED.  His abdomen does not seem significantly distended but exam is limited as he is obese.  He has no tympany or fluid wave  on exam.   I reviewed all nursing notes, vitals, pertinent previous records, EKGs, lab and urine results, imaging (as available).      Ward, Delice Bison, DO 02/19/18 [phone]

## 2018-02-19 NOTE — H&P (Signed)
History and Physical    [name] YNW:[id number] DOB: 12-Dec-1950 DOA: 02/19/2018  Referring MD/NP/PA: Cyril Mourning Ward PCP: [name], [place]   Patient coming from: SNF  Chief Complaint: nausea, vomiting, diarrhea  HPI: [name] is a 67 y.o. male with history of schizoaffective disorder, chronic diastolic heart failure, hypertension, hyperlipidemia, COPD, non-Hodgkin's lymphoma, chronic kidney disease stage III/IV who presents with nausea, vomiting, diarrhea.  Patient states that he developed diarrhea the night before he presented to the emergency department.  He woke up in the middle the night and had a formed bowel movement but then had bowel urgency afterwards and each time he would go he had increasingly liquid stools.  He denies abdominal pains but eventually he developed some vomiting of some yellowish to light green emesis.  He felt he might of been somewhat feverish but denies chills.  Patient is a difficult historian related to his schizophrenia.    He was treated with antibiotics in January for possible pneumonia and right lower extremity cellulitis.  ED Course: Vital signs notable for mild tachycardia to the low 100s, blood pressure initially 99/50 but improved to normal within an hour of arriving at the ER, breathing comfortably on room air.  Labs: Creatinine 2.35 with a baseline of 1.8, LFTs and lipase within normal limits.  White blood cell count 13.9.  Urinalysis was generally negative except he had small hemoglobin and too numerous to count RBCs.  CT of the abdomen and pelvis demonstrated multiple dilated loops of small bowel in the mid abdomen with mild surrounding edema suggestive of partial bowel obstruction versus enteritis.  He has mild diffuse bladder wall thickening which is stable from prior.  RN attempting to replace NG (malpositioned).    Review of Systems:   Complete 12 point review of systems reviewed with patient and negative except as mentioned above.     Past Medical History:  Diagnosis Date  . Cellulitis   . CHF (congestive heart failure) ([place])   . Chronic kidney disease 09/20/2008  . Chronic lymphocytic leukemia ([place])   . cll dx'd 10/2003   no rx  . CLL (chronic lymphoblastic leukemia) 10/2003  . COPD (chronic obstructive pulmonary disease) ([place])   . GERD (gastroesophageal reflux disease)   . Hiatal hernia   . Hyperlipemia   . Hypertension   . Lower extremity edema   . NHL (non-Hodgkin's lymphoma) ([place]) dx'd 08/2008 rt groin   chemo comp 08/2008  . Non Hodgkin's lymphoma ([place]) 08/2008  . Non Hodgkin's lymphoma ([place])   . Paranoid schizophrenia ([place])   . Prolonged Q-T interval on ECG   . RBBB 09/17/2017  . Schizoaffective disorder ([place])   . Sepsis ([place])   . Severe left ventricular hypertrophy 10/21/2017  . Vitamin D deficiency     Past Surgical History:  Procedure Laterality Date  . LYMPH NODE BIOPSY       reports that he has never smoked. He has never used smokeless tobacco. He reports that he does not drink alcohol or use drugs.  Allergies  Allergen Reactions  . Sulfamethoxazole Rash    Family History  Problem Relation Age of Onset  . Alcohol abuse Father     Prior to Admission medications   Medication Sig Start Date End Date Taking? Authorizing Provider  acetaminophen (TYLENOL) 325 MG tablet Take 650 mg by mouth every 6 (six) hours as needed for mild pain or moderate pain.   Yes [provider]  albuterol (PROVENTIL HFA;VENTOLIN HFA) 108 (90  Base) MCG/ACT inhaler Inhale 2 puffs into the lungs every 4 (four) hours as needed for wheezing or shortness of breath.   Yes [provider]  aspirin EC 81 MG tablet Take 81 mg by mouth daily.   Yes [provider]  atorvastatin (LIPITOR) 20 MG tablet Take 20 mg by mouth daily.   Yes [provider]  chlorproMAZINE (THORAZINE) 100 MG tablet Take 100 mg at bedtime by mouth.    Yes [provider]  chlorproMAZINE (THORAZINE) 50  MG tablet Take 50 mg 4 (four) times daily by mouth.    Yes [provider]  Cholecalciferol (VITAMIN D-3 PO) Take 5,000 mg by mouth daily.   Yes [provider]  cloZAPine (CLOZARIL) 100 MG tablet Take 100 mg by mouth 2 (two) times daily.    Yes [provider]  clozapine (CLOZARIL) 50 MG tablet Take 50 mg by mouth at bedtime.    Yes [provider]  docusate sodium (COLACE) 100 MG capsule Take 100 mg by mouth daily.   Yes [provider]  escitalopram (LEXAPRO) 10 MG tablet Take 10 mg by mouth daily before breakfast.    Yes [provider]  lamoTRIgine (LAMICTAL) 25 MG tablet Take 50 mg by mouth 3 (three) times daily.    Yes [provider]  levothyroxine (SYNTHROID, LEVOTHROID) 100 MCG tablet Take 100 mcg daily by mouth.    Yes [provider]  Nystatin POWD Apply 1 application 3 (three) times daily as needed topically (Rash).   Yes [provider]  Omega-3 Fatty Acids (FISH OIL) 1000 MG CAPS Take 4,000 mg by mouth daily.   Yes [provider]  polyethylene glycol (MIRALAX / GLYCOLAX) packet Take 17 g daily by mouth. Dissolve 17g in 8oz of water/juice and drink by mouth every day   Yes [provider]  senna (SENOKOT) 8.6 MG TABS tablet Take 1 tablet by mouth daily.   Yes [provider]    Physical Exam: Vitals:   02/19/18 0500 02/19/18 0700 02/19/18 0800 02/19/18 0900  BP: 127/67 124/64 124/74 115/75  Pulse: 99 (!) 102 (!) 103 (!) 104  Resp: (!) 22 18 18 17   Temp:      TempSrc:      SpO2: 92% 92% 93% 91%    Constitutional:  NAD, calm, comfortable, eyes half closed and does not make regular eye contact during interview.  Slow to answer questions, but generally appropriate answers. Eyes: PERRL, lids and conjunctivae normal ENMT:  Moist mucous membranes.  Oropharynx nonerythematous, no exudates.   Neck:  No nuchal rigidity, no masses Respiratory:  No wheezes, rales, or  rhonchi Cardiovascular: Regular rate and rhythm, no murmurs / rubs / gallops.  2+ radial pulses. Abdomen:  Hypoactive bowel sounds, distended in the epigastric area and upper quadrants, softer below, nontender.   Musculoskeletal: decreased muscle tone and bulk.  no contractures.  Skin:  no rashes, abrasions, or ulcers Neurologic:  CN 2-12 grossly intact. Sensation intact to light touch, strength 5-/5 throughout Psychiatric:  Alert and oriented x 3. Blunted affect.   Labs on Admission: I have personally reviewed following labs and imaging studies  CBC: Recent Labs  Lab 02/19/18 0310  WBC 13.9*  HGB 15.0  HCT 46.6  MCV 93.8  PLT 235*   Basic Metabolic Panel: Recent Labs  Lab 02/19/18 0310  NA 138  K 4.7  CL 104  CO2 24  GLUCOSE 138*  BUN 37*  CREATININE 2.35*  CALCIUM  8.9   GFR: CrCl cannot be calculated (Unknown ideal weight.). Liver Function Tests: Recent Labs  Lab 02/19/18 0310  AST 30  ALT 22  ALKPHOS 72  BILITOT 1.2  PROT 6.5  ALBUMIN 3.6   Recent Labs  Lab 02/19/18 0310  LIPASE 18   No results for input(s): AMMONIA in the last 168 hours. Coagulation Profile: No results for input(s): INR, PROTIME in the last 168 hours. Cardiac Enzymes: No results for input(s): CKTOTAL, CKMB, CKMBINDEX, TROPONINI in the last 168 hours. BNP (last 3 results) Recent Labs    09/14/17 1527  PROBNP 57   HbA1C: No results for input(s): HGBA1C in the last 72 hours. CBG: No results for input(s): GLUCAP in the last 168 hours. Lipid Profile: No results for input(s): CHOL, HDL, LDLCALC, TRIG, CHOLHDL, LDLDIRECT in the last 72 hours. Thyroid Function Tests: No results for input(s): TSH, T4TOTAL, FREET4, T3FREE, THYROIDAB in the last 72 hours. Anemia Panel: No results for input(s): VITAMINB12, FOLATE, FERRITIN, TIBC, IRON, RETICCTPCT in the last 72 hours. Urine analysis:    Component Value Date/Time   COLORURINE YELLOW 02/19/2018 0518   APPEARANCEUR HAZY (A) 02/19/2018  0518   LABSPEC 1.024 02/19/2018 0518   PHURINE 5.0 02/19/2018 0518   GLUCOSEU NEGATIVE 02/19/2018 0518   HGBUR SMALL (A) 02/19/2018 0518   BILIRUBINUR NEGATIVE 02/19/2018 0518   KETONESUR NEGATIVE 02/19/2018 0518   PROTEINUR NEGATIVE 02/19/2018 0518   UROBILINOGEN 0.2 05/09/2015 1740   NITRITE NEGATIVE 02/19/2018 0518   LEUKOCYTESUR NEGATIVE 02/19/2018 0518   Sepsis Labs: @LABRCNTIP (procalcitonin:4,lacticidven:4) )No results found for this or any previous visit (from the past 240 hour(s)).   Radiological Exams on Admission: Ct Abdomen Pelvis Wo Contrast  Result Date: 02/19/2018 CLINICAL DATA:  67 y/o M; abdominal pain, diarrhea, and severe vomiting. History of non-Hodgkin's lymphoma. EXAM: CT ABDOMEN AND PELVIS WITHOUT CONTRAST TECHNIQUE: Multidetector CT imaging of the abdomen and pelvis was performed following the standard protocol without IV contrast. COMPARISON:  12/07/2011 CT abdomen and pelvis. FINDINGS: Lower chest: Trace pleural effusions and left lower lobe platelike atelectasis. Small hiatal hernia. Hepatobiliary: No focal liver abnormality is seen. No gallstones, gallbladder wall thickening, or biliary dilatation. Pancreas: Unremarkable. No pancreatic ductal dilatation or surrounding inflammatory changes. Spleen: Normal in size without focal abnormality. Adrenals/Urinary Tract: Stable 15 mm right adrenal adenoma. Normal left adrenal gland. No focal kidney lesion. No hydronephrosis. Stable mild diffuse bladder wall thickening and small diverticula. Stomach/Bowel: Mild diffuse dilatation of small bowel in the mid abdomen small amount of surrounding edema. No focal transition point identified. Large bowel is unremarkable. Normal appendix. Vascular/Lymphatic: Aortic atherosclerosis. No enlarged abdominal or pelvic lymph nodes. Reproductive: Mild prostate enlargement with small calcifications. Other: Stable small inguinal hernias containing fat. Musculoskeletal: No acute or significant  osseous findings. IMPRESSION: 1. Multiple dilated loops of small bowel in the mid abdomen with mild surrounding edema may represent partial obstruction or enteritis. No discrete transition point identified. 2. Trace pleural effusions. 3. Small hiatal hernia. 4. Stable right adrenal adenoma. 5. Stable mild diffuse bladder wall thickening and small diverticula, probably due to chronic outflow obstruction. Stable mild prostate enlargement. 6. Aortic atherosclerosis. Electronically Signed   By: [name] Garbe M.D.   On: 02/19/2018 06:55   Dg Abd Portable 1 View  Result Date: 02/19/2018 CLINICAL DATA:  Nasogastric tube repositioning. EXAM: PORTABLE ABDOMEN - 1 VIEW COMPARISON:  Film earlier today at 0925 hours. FINDINGS: Nasogastric tube has now been straightened with the tip projecting in the proximal body of  the stomach. IMPRESSION: Nasogastric tube tip now lies in the body of the stomach. Electronically Signed   By: Aletta Edouard M.D.   On: 02/19/2018 10:19   Dg Abd Portable 1 View  Result Date: 02/19/2018 CLINICAL DATA:  Status post repositioning of nasogastric catheter EXAM: PORTABLE ABDOMEN - 1 VIEW COMPARISON:  Film from earlier in the same day. FINDINGS: The overall appearance of the nasogastric catheter is stable. A remains coiled upon itself with tip in the distal esophagus. IMPRESSION: Overall stable appearance of the nasogastric catheter looped within the stomach with the tip in the distal esophagus. Electronically Signed   By: Inez Catalina M.D.   On: 02/19/2018 09:51   Dg Abd Portable 1 View  Result Date: 02/19/2018 CLINICAL DATA:  Check nasogastric catheter placement EXAM: PORTABLE ABDOMEN - 1 VIEW COMPARISON:  02/19/2018 FINDINGS: Small bowel dilatation is noted consistent with obstruction/ileus. Nasogastric catheter is noted within the stomach although the catheter is coiled upon itself with the tip within the distal esophagus several cm above the gastroesophageal junction. This  should be withdrawn and readvanced. IMPRESSION: Nasogastric catheter coiled within the stomach although the tip is noted in the distal esophagus several cm proximal to the gastroesophageal junction. Electronically Signed   By: Inez Catalina M.D.   On: 02/19/2018 09:10    EKG: Independently reviewed.  Normal sinus rhythm, right bundle branch block, no evidence of acute ischemia  Assessment/Plan Principal Problem:   Small bowel obstruction (HCC) Active Problems:   CKD (chronic kidney disease), stage III (HCC)   Schizophrenia (HCC)   Non Hodgkin's lymphoma (HCC)   Hypothyroidism   Intractable nausea, vomiting and diarrhea, possibly due to pSBO vs. gastroenteritis.  No BM since coming to ER.  NG has removed about a liter of light green material from stomach since insertion - NPO - NG to LIS - IVF - Phenergan as needed - C. difficile PCR due to antibiotics within the last 3 months - check KUB in AM and start small bowel protocol if improving  Schizoaffective disorder/paranoid schizophrenia - Will given IV chlorpromazine -  Hold oral clozapine, Lexapro, lamotrigine until able to advance diet  Prolonged QTC, 428msec  Chronic kidney disease stage III/IV, creatinine up slightly from baseline of 1.8 to 2.3  Hyperlipidemia, hold atorvastatin while NPO  Asthma, stable, continue as needed albuterol  Hypothyroidism, stable, change to IV levothyroxine  Morbid obesity  DVT prophylaxis: heparin  Code Status: full Family Communication:  Patient alone Disposition Plan: likely back to SNF  Consults called: none  Admission status: inpatient, med-surg   Janece Canterbury MD [facility] Pager [phone]  If 7PM-7AM, please contact night-coverage [email or web address] Password [facility]  02/19/2018, 10:25 AM

## 2018-02-19 NOTE — ED Notes (Signed)
ED TO INPATIENT HANDOFF REPORT  Name/Age/Gender [name] 67 y.o. male  Code Status    Code Status Orders  (From admission, onward)        Start     Ordered   02/19/18 1024  Full code  Continuous     02/19/18 1025    Code Status History    Date Active Date Inactive Code Status Order ID Comments User Context   12/02/2017 2308 12/06/2017 2008 Full Code [id number]  Damita Lack, MD ED   10/02/2017 1009 10/04/2017 1830 Full Code [id number]  Rondel Jumbo, PA-C ED   06/03/2016 1604 06/06/2016 1900 Full Code [id number]  Collene Gobble, MD Inpatient   02/13/2016 0128 02/17/2016 1959 Full Code [id number]  Phillips Grout, MD Inpatient   05/15/2015 2311 05/17/2015 1839 Full Code [id number]  Evelina Bucy, MD ED   04/06/2015 1243 04/11/2015 1357 Full Code [id number]  Oswald Hillock, MD Inpatient   03/27/2015 2138 03/31/2015 1437 Full Code [id number]  Toy Baker, MD Inpatient   12/24/2012 1555 12/26/2012 1812 Full Code [id number]  Robbie Lis, MD Inpatient      Home/SNF/Other Home  Chief Complaint emeris  Level of Care/Admitting Diagnosis ED Disposition    ED Disposition Condition [place]: [facility] [[id number]]  Level of Care: Med-Surg [16]  Diagnosis: Small bowel obstruction [facility]) [[id number]]  Admitting Physician: Janece Canterbury [phone]  Attending Physician: Janece Canterbury [phone]  Estimated length of stay: past midnight tomorrow  Certification:: I certify this patient will need inpatient services for at least 2 midnights  PT Class (Do Not Modify): Inpatient [101]  PT Acc Code (Do Not Modify): Private [1]       Medical History Past Medical History:  Diagnosis Date  . Cellulitis   . CHF (congestive heart failure) ([place])   . Chronic kidney disease 09/20/2008  . Chronic lymphocytic leukemia ([place])   . cll dx'd 10/2003   no rx  . CLL (chronic lymphoblastic leukemia) 10/2003  . COPD (chronic obstructive pulmonary disease) ([place])    . GERD (gastroesophageal reflux disease)   . Hiatal hernia   . Hyperlipemia   . Hypertension   . Lower extremity edema   . NHL (non-Hodgkin's lymphoma) ([place]) dx'd 08/2008 rt groin   chemo comp 08/2008  . Non Hodgkin's lymphoma ([place]) 08/2008  . Non Hodgkin's lymphoma ([place])   . Paranoid schizophrenia ([place])   . Prolonged Q-T interval on ECG   . RBBB 09/17/2017  . Schizoaffective disorder ([place])   . Sepsis ([place])   . Severe left ventricular hypertrophy 10/21/2017  . Vitamin D deficiency     Allergies Allergies  Allergen Reactions  . Sulfamethoxazole Rash    IV Location/Drains/Wounds Patient Lines/Drains/Airways Status   Active Line/Drains/Airways    Name:   Placement date:   Placement time:   Site:   Days:   Peripheral IV 02/19/18 Left Hand   02/19/18    0305    Hand   less than 1   NG/OG Tube Nasogastric 146 Fr. Left nare Aucultation;Xray Documented cm marking at nare/ corner of mouth   02/19/18    0853    Left nare   less than 1   Pressure Injury 10/02/17 Stage II -  Partial thickness loss of dermis presenting as a shallow open ulcer with a red, pink wound bed without slough.   10/02/17    1636     140   Pressure Injury 12/03/17  Stage II -  Partial thickness loss of dermis presenting as a shallow open ulcer with a red, pink wound bed without slough.   12/03/17    1430     78          Labs/Imaging Results for orders placed or performed during the hospital encounter of 02/19/18 (from the past 48 hour(s))  Lipase, blood     Status: None   Collection Time: 02/19/18  3:10 AM  Result Value Ref Range   Lipase 18 11 - 51 U/L    Comment: Performed at [facility], [place] [address], [place], [place] [address]  Comprehensive metabolic panel     Status: Abnormal   Collection Time: 02/19/18  3:10 AM  Result Value Ref Range   Sodium 138 135 - 145 mmol/L   Potassium 4.7 3.5 - 5.1 mmol/L   Chloride 104 101 - 111 mmol/L   CO2 24 22 - 32 mmol/L   Glucose, Bld 138 (H) 65  - 99 mg/dL   BUN 37 (H) 6 - 20 mg/dL   Creatinine, Ser 2.35 (H) 0.61 - 1.24 mg/dL   Calcium 8.9 8.9 - 10.3 mg/dL   Total Protein 6.5 6.5 - 8.1 g/dL   Albumin 3.6 3.5 - 5.0 g/dL   AST 30 15 - 41 U/L   ALT 22 17 - 63 U/L   Alkaline Phosphatase 72 38 - 126 U/L   Total Bilirubin 1.2 0.3 - 1.2 mg/dL   GFR calc non Af Amer 28 (L) >60 mL/min   GFR calc Af Amer 32 (L) >60 mL/min    Comment: (NOTE) The eGFR has been calculated using the CKD EPI equation. This calculation has not been validated in all clinical situations. eGFR's persistently <60 mL/min signify possible Chronic Kidney Disease.    Anion gap 10 5 - 15    Comment: Performed at [facility], [place] [address] [place], [place] [address]  CBC     Status: Abnormal   Collection Time: 02/19/18  3:10 AM  Result Value Ref Range   WBC 13.9 (H) 4.0 - 10.5 K/uL   RBC 4.97 4.22 - 5.81 MIL/uL   Hemoglobin 15.0 13.0 - 17.0 g/dL   HCT 46.6 39.0 - 52.0 %   MCV 93.8 78.0 - 100.0 fL   MCH 30.2 26.0 - 34.0 pg   MCHC 32.2 30.0 - 36.0 g/dL   RDW 14.4 11.5 - 15.5 %   Platelets 141 (L) 150 - 400 K/uL    Comment: Performed at [facility], [place] [address], [place], [place] [address]  Urinalysis, Routine w reflex microscopic     Status: Abnormal   Collection Time: 02/19/18  5:18 AM  Result Value Ref Range   Color, Urine YELLOW YELLOW   APPearance HAZY (A) CLEAR   Specific Gravity, Urine 1.024 1.005 - 1.030   pH 5.0 5.0 - 8.0   Glucose, UA NEGATIVE NEGATIVE mg/dL   Hgb urine dipstick SMALL (A) NEGATIVE   Bilirubin Urine NEGATIVE NEGATIVE   Ketones, ur NEGATIVE NEGATIVE mg/dL   Protein, ur NEGATIVE NEGATIVE mg/dL   Nitrite NEGATIVE NEGATIVE   Leukocytes, UA NEGATIVE NEGATIVE   RBC / HPF TOO NUMEROUS TO COUNT 0 - 5 RBC/hpf   WBC, UA 0-5 0 - 5 WBC/hpf   Bacteria, UA NONE SEEN NONE SEEN   Squamous Epithelial / LPF 0-5 (A) NONE SEEN   Mucus PRESENT    Hyaline Casts, UA PRESENT    Granular  Casts, UA PRESENT      Comment: Performed at [facility], [place] [address], [place], [place] [address]   Ct Abdomen Pelvis Wo Contrast  Result Date: 02/19/2018 CLINICAL DATA:  67 y/o M; abdominal pain, diarrhea, and severe vomiting. History of non-Hodgkin's lymphoma. EXAM: CT ABDOMEN AND PELVIS WITHOUT CONTRAST TECHNIQUE: Multidetector CT imaging of the abdomen and pelvis was performed following the standard protocol without IV contrast. COMPARISON:  12/07/2011 CT abdomen and pelvis. FINDINGS: Lower chest: Trace pleural effusions and left lower lobe platelike atelectasis. Small hiatal hernia. Hepatobiliary: No focal liver abnormality is seen. No gallstones, gallbladder wall thickening, or biliary dilatation. Pancreas: Unremarkable. No pancreatic ductal dilatation or surrounding inflammatory changes. Spleen: Normal in size without focal abnormality. Adrenals/Urinary Tract: Stable 15 mm right adrenal adenoma. Normal left adrenal gland. No focal kidney lesion. No hydronephrosis. Stable mild diffuse bladder wall thickening and small diverticula. Stomach/Bowel: Mild diffuse dilatation of small bowel in the mid abdomen small amount of surrounding edema. No focal transition point identified. Large bowel is unremarkable. Normal appendix. Vascular/Lymphatic: Aortic atherosclerosis. No enlarged abdominal or pelvic lymph nodes. Reproductive: Mild prostate enlargement with small calcifications. Other: Stable small inguinal hernias containing fat. Musculoskeletal: No acute or significant osseous findings. IMPRESSION: 1. Multiple dilated loops of small bowel in the mid abdomen with mild surrounding edema may represent partial obstruction or enteritis. No discrete transition point identified. 2. Trace pleural effusions. 3. Small hiatal hernia. 4. Stable right adrenal adenoma. 5. Stable mild diffuse bladder wall thickening and small diverticula, probably due to chronic outflow obstruction. Stable mild prostate enlargement. 6.  Aortic atherosclerosis. Electronically Signed   By: [name] Garbe M.D.   On: 02/19/2018 06:55   Dg Abd Portable 1 View  Result Date: 02/19/2018 CLINICAL DATA:  Nasogastric tube repositioning. EXAM: PORTABLE ABDOMEN - 1 VIEW COMPARISON:  Film earlier today at 0925 hours. FINDINGS: Nasogastric tube has now been straightened with the tip projecting in the proximal body of the stomach. IMPRESSION: Nasogastric tube tip now lies in the body of the stomach. Electronically Signed   By: Aletta Edouard M.D.   On: 02/19/2018 10:19   Dg Abd Portable 1 View  Result Date: 02/19/2018 CLINICAL DATA:  Status post repositioning of nasogastric catheter EXAM: PORTABLE ABDOMEN - 1 VIEW COMPARISON:  Film from earlier in the same day. FINDINGS: The overall appearance of the nasogastric catheter is stable. A remains coiled upon itself with tip in the distal esophagus. IMPRESSION: Overall stable appearance of the nasogastric catheter looped within the stomach with the tip in the distal esophagus. Electronically Signed   By: Inez Catalina M.D.   On: 02/19/2018 09:51   Dg Abd Portable 1 View  Result Date: 02/19/2018 CLINICAL DATA:  Check nasogastric catheter placement EXAM: PORTABLE ABDOMEN - 1 VIEW COMPARISON:  02/19/2018 FINDINGS: Small bowel dilatation is noted consistent with obstruction/ileus. Nasogastric catheter is noted within the stomach although the catheter is coiled upon itself with the tip within the distal esophagus several cm above the gastroesophageal junction. This should be withdrawn and readvanced. IMPRESSION: Nasogastric catheter coiled within the stomach although the tip is noted in the distal esophagus several cm proximal to the gastroesophageal junction. Electronically Signed   By: Inez Catalina M.D.   On: 02/19/2018 09:10    Pending Labs Unresulted Labs (From admission, onward)   Start     Ordered   02/20/18 7353  Basic metabolic panel  Tomorrow morning,   R     02/19/18 1025  02/20/18 0500   CBC  Tomorrow morning,   R     02/19/18 1025   02/19/18 1025  C difficile quick scan w PCR reflex  (C Difficile quick screen w PCR reflex panel)  Once, for 24 hours,   R     02/19/18 1025      Vitals/Pain Today's Vitals   02/19/18 0800 02/19/18 0900 02/19/18 1000 02/19/18 1030  BP: 124/74 115/75 110/74 124/78  Pulse: (!) 103 (!) 104 (!) 109 (!) 105  Resp: '18 17 19 18  ' Temp:      TempSrc:      SpO2: 93% 91% 93% 92%    Isolation Precautions Enteric precautions (UV disinfection)  Medications Medications  ondansetron (ZOFRAN) injection 4 mg (has no administration in time range)  chlorproMAZINE (THORAZINE) 25 mg in sodium chloride 0.9 % 25 mL IVPB (0 mg Intravenous Stopped 02/19/18 1137)  levothyroxine (SYNTHROID, LEVOTHROID) injection 50 mcg (50 mcg Intravenous Given 02/19/18 1056)  heparin injection 5,000 Units (has no administration in time range)  dextrose 5 %-0.45 % sodium chloride infusion ( Intravenous New Bag/Given 02/19/18 1040)  promethazine (PHENERGAN) tablet 12.5 mg (has no administration in time range)  albuterol (PROVENTIL) (2.5 MG/3ML) 0.083% nebulizer solution 2.5 mg (has no administration in time range)  sodium chloride 0.9 % bolus 1,000 mL (0 mLs Intravenous Stopped 02/19/18 0712)  ondansetron (ZOFRAN) injection 4 mg (4 mg Intravenous Given 02/19/18 0429)  loperamide (IMODIUM) capsule 2 mg (2 mg Oral Given 02/19/18 0429)    Mobility walks with person assist

## 2018-02-20 ENCOUNTER — Inpatient Hospital Stay (HOSPITAL_COMMUNITY): Payer: Medicare Other

## 2018-02-20 LAB — BASIC METABOLIC PANEL
Anion gap: 9 (ref 5–15)
BUN: 34 mg/dL — AB (ref 6–20)
CALCIUM: 8.6 mg/dL — AB (ref 8.9–10.3)
CO2: 26 mmol/L (ref 22–32)
CREATININE: 1.99 mg/dL — AB (ref 0.61–1.24)
Chloride: 104 mmol/L (ref 101–111)
GFR calc Af Amer: 39 mL/min — ABNORMAL LOW (ref >60)
GFR calc non Af Amer: 34 mL/min — ABNORMAL LOW (ref >60)
GLUCOSE: 160 mg/dL — AB (ref 65–99)
Potassium: 3.6 mmol/L (ref 3.5–5.1)
Sodium: 139 mmol/L (ref 135–145)

## 2018-02-20 LAB — CBC
HCT: 42.1 % (ref 39.0–52.0)
Hemoglobin: 13.5 g/dL (ref 13.0–17.0)
MCH: 29.7 pg (ref 26.0–34.0)
MCHC: 32.1 g/dL (ref 30.0–36.0)
MCV: 92.7 fL (ref 78.0–100.0)
PLATELETS: 148 10*3/uL — AB (ref 150–400)
RBC: 4.54 MIL/uL (ref 4.22–5.81)
RDW: 14.3 % (ref 11.5–15.5)
WBC: 7.5 10*3/uL (ref 4.0–10.5)

## 2018-02-20 MED ORDER — ATORVASTATIN CALCIUM 20 MG PO TABS
20.0000 mg | ORAL_TABLET | Freq: Every day | ORAL | Status: DC
  Administered 2018-02-20 – 2018-02-21 (×2): 20 mg via ORAL
  Filled 2018-02-20 (×2): qty 1

## 2018-02-20 MED ORDER — LEVOTHYROXINE SODIUM 100 MCG PO TABS
100.0000 ug | ORAL_TABLET | Freq: Every day | ORAL | Status: DC
  Administered 2018-02-20 – 2018-02-21 (×2): 100 ug via ORAL
  Filled 2018-02-20 (×2): qty 1

## 2018-02-20 MED ORDER — AMOXICILLIN-POT CLAVULANATE 875-125 MG PO TABS
1.0000 | ORAL_TABLET | Freq: Two times a day (BID) | ORAL | Status: DC
  Administered 2018-02-20 – 2018-02-21 (×3): 1 via ORAL
  Filled 2018-02-20 (×3): qty 1

## 2018-02-20 MED ORDER — ESCITALOPRAM OXALATE 10 MG PO TABS
10.0000 mg | ORAL_TABLET | Freq: Every day | ORAL | Status: DC
  Administered 2018-02-21: 10 mg via ORAL
  Filled 2018-02-20: qty 1

## 2018-02-20 MED ORDER — POLYETHYLENE GLYCOL 3350 17 G PO PACK
17.0000 g | PACK | Freq: Every day | ORAL | Status: DC
  Administered 2018-02-20: 17 g via ORAL
  Filled 2018-02-20 (×2): qty 1

## 2018-02-20 MED ORDER — CLOZAPINE 25 MG PO TABS
50.0000 mg | ORAL_TABLET | Freq: Every day | ORAL | Status: DC
  Administered 2018-02-20: 50 mg via ORAL
  Filled 2018-02-20 (×2): qty 2

## 2018-02-20 MED ORDER — OMEGA-3-ACID ETHYL ESTERS 1 G PO CAPS
1.0000 g | ORAL_CAPSULE | Freq: Two times a day (BID) | ORAL | Status: DC
  Administered 2018-02-20 – 2018-02-21 (×3): 1 g via ORAL
  Filled 2018-02-20 (×3): qty 1

## 2018-02-20 MED ORDER — ASPIRIN EC 81 MG PO TBEC
81.0000 mg | DELAYED_RELEASE_TABLET | Freq: Every day | ORAL | Status: DC
  Administered 2018-02-20 – 2018-02-21 (×2): 81 mg via ORAL
  Filled 2018-02-20 (×2): qty 1

## 2018-02-20 MED ORDER — VITAMIN D3 25 MCG (1000 UNIT) PO TABS
5000.0000 [IU] | ORAL_TABLET | Freq: Every day | ORAL | Status: DC
  Administered 2018-02-20 – 2018-02-21 (×2): 5000 [IU] via ORAL
  Filled 2018-02-20 (×4): qty 5

## 2018-02-20 MED ORDER — CHLORPROMAZINE HCL 50 MG PO TABS
50.0000 mg | ORAL_TABLET | ORAL | Status: DC
  Administered 2018-02-20 – 2018-02-21 (×6): 50 mg via ORAL
  Filled 2018-02-20 (×9): qty 1

## 2018-02-20 MED ORDER — CHLORPROMAZINE HCL 50 MG PO TABS
100.0000 mg | ORAL_TABLET | Freq: Every day | ORAL | Status: DC
  Administered 2018-02-20: 100 mg via ORAL
  Filled 2018-02-20 (×2): qty 2

## 2018-02-20 MED ORDER — CLOZAPINE 100 MG PO TABS
100.0000 mg | ORAL_TABLET | Freq: Two times a day (BID) | ORAL | Status: DC
  Administered 2018-02-20 – 2018-02-21 (×3): 100 mg via ORAL
  Filled 2018-02-20 (×4): qty 1

## 2018-02-20 MED ORDER — SENNA 8.6 MG PO TABS
1.0000 | ORAL_TABLET | Freq: Every day | ORAL | Status: DC
  Administered 2018-02-20 – 2018-02-21 (×2): 8.6 mg via ORAL
  Filled 2018-02-20 (×2): qty 1

## 2018-02-20 MED ORDER — DOCUSATE SODIUM 100 MG PO CAPS
100.0000 mg | ORAL_CAPSULE | Freq: Every day | ORAL | Status: DC
  Administered 2018-02-20 – 2018-02-21 (×2): 100 mg via ORAL
  Filled 2018-02-20 (×2): qty 1

## 2018-02-20 MED ORDER — LAMOTRIGINE 25 MG PO TABS
50.0000 mg | ORAL_TABLET | ORAL | Status: DC
Start: 2018-02-20 — End: 2018-02-21
  Administered 2018-02-20 – 2018-02-21 (×4): 50 mg via ORAL
  Filled 2018-02-20 (×4): qty 2

## 2018-02-20 NOTE — Progress Notes (Signed)
2 RNs attempted to insert ng tube with no success. MD notified

## 2018-02-20 NOTE — Progress Notes (Signed)
Pt pulled out ng tube. MD notified. Orders given.

## 2018-02-20 NOTE — Progress Notes (Signed)
PROGRESS NOTE    [name]  JXB:[id number] DOB: 03-09-1951 DOA: 02/19/2018 PCP: [name], FNP    Brief Narrative:  67 year old with past medical history relevant for schizoaffective disorder, chronic diastolic heart failure, hypertension, hyperlipidemia, COPD, stage III chronic kidney disease, CLL with transformation of non-Hodgkin's lymphoma in 2009 status post chemotherapy considered cured, hypothyroidism coming in with nausea, vomiting, abdominal pain, diarrhea with CT findings concerning for possible enteritis versus small bowel obstruction.   Assessment & Plan:   Principal Problem:   Small bowel obstruction (HCC) Active Problems:   CKD (chronic kidney disease), stage III (HCC)   Schizophrenia (HCC)   Non Hodgkin's lymphoma ([place])   Hypothyroidism   #) Nausea/vomiting and diarrhea: Clinically the patient is able to tolerate p.o. quite well.  He is quite thirsty.  He is requesting food to eat.  He is also passing gas and having flatus.  Does not clinically appear to have small bowel obstruction.  As such for now we will try to advance his diet and treat him for infectious enteritis. -Thin liquid diet, advance diet as tolerated -Discontinue IV fluids -We will start Augmentin twice daily, will avoid ciprofloxacin due to multiple QT prolonging medications - As needed antiemetics  #) Schizoaffective disorder: Patient is on multiple and quite old antipsychotic medications.  He is apparently seen cardiology in the past for relatively long QTC including one that hovers around 500.  He is currently at 500. -Continue chlorpromazine 100 mg nightly and 50 mg 4 times daily -Continue clozapine 100 mg twice daily and 50 mg nightly -Continue escitalopram 10 mg daily -Continue lamotrigine 50 mg 3 times daily  #) Hypothyroidism: -Continue levothyroxine 100 mcg  #) hyperlipidemia: -Continue aspirin 81 mg daily -Continue atorvastatin 20 mg daily  #) CKD stage III: Currently at  baseline - Avoid nephrotoxins  Fluids: Tolerating p.o. Electrolytes: Monitor and supplement Nutrition: Clear liquid diet, advance diet as tolerated  Prophylaxis: Subcu heparin  Disposition: Pending tolerating diet and able to keep self hydrated  Full code  Consultants:   None  Procedures: (Don't include imaging studies which can be auto populated. Include things that cannot be auto populated i.e. Echo, Carotid and venous dopplers, Foley, Bipap, HD, tubes/drains, wound vac, central lines etc)  None  Antimicrobials: (specify start and planned stop date. Auto populated tables are space occupying and do not give end dates)  Augmentin started 02/20/2018   Subjective: Patient reports that he does not having any abdominal pain.  He reports he is able to drink and is quite hungry.  Objective: Vitals:   02/19/18 1229 02/19/18 1311 02/19/18 2148 02/20/18 0607  BP: 119/76  134/75 (!) 90/53  Pulse: (!) 102  (!) 101 98  Resp: 20  20 18   Temp: 98.8 F (37.1 C)  98.4 F (36.9 C) 99 F (37.2 C)  TempSrc:   Oral Oral  SpO2: 97%  95% 92%  Weight: 123 kg (271 lb 2.7 oz)     Height:  6' (1.829 m)      Intake/Output Summary (Last 24 hours) at 02/20/2018 1231 Last data filed at 02/20/2018 1000 Gross per 24 hour  Intake 1630 ml  Output 1250 ml  Net 380 ml   Filed Weights   02/19/18 1229  Weight: 123 kg (271 lb 2.7 oz)    Examination:  General exam: Appears calm and comfortable  Respiratory system: Clear to auscultation. Respiratory effort normal. Cardiovascular system: Regular rate and rhythm, no murmurs Gastrointestinal system: Soft, mildly distended, nontender,  plus bowel sounds, no organomegaly Central nervous system: Alert and oriented.  Spontaneously moving all extremities Extremities: No lower extremity edema Skin: No rashes on visible skin Psychiatry: Patient appears to be alert and oriented x4.  He understands that he is in the hospital for.  His judgment and insight  appear intact however he appears to be quite slowed.    Data Reviewed: I have personally reviewed following labs and imaging studies  CBC: Recent Labs  Lab 02/19/18 0310 02/20/18 0437  WBC 13.9* 7.5  HGB 15.0 13.5  HCT 46.6 42.1  MCV 93.8 92.7  PLT 141* 509*   Basic Metabolic Panel: Recent Labs  Lab 02/19/18 0310 02/20/18 0437  NA 138 139  K 4.7 3.6  CL 104 104  CO2 24 26  GLUCOSE 138* 160*  BUN 37* 34*  CREATININE 2.35* 1.99*  CALCIUM 8.9 8.6*   GFR: Estimated Creatinine Clearance: 50.8 mL/min (A) (by C-G formula based on SCr of 1.99 mg/dL (H)). Liver Function Tests: Recent Labs  Lab 02/19/18 0310  AST 30  ALT 22  ALKPHOS 72  BILITOT 1.2  PROT 6.5  ALBUMIN 3.6   Recent Labs  Lab 02/19/18 0310  LIPASE 18   No results for input(s): AMMONIA in the last 168 hours. Coagulation Profile: No results for input(s): INR, PROTIME in the last 168 hours. Cardiac Enzymes: No results for input(s): CKTOTAL, CKMB, CKMBINDEX, TROPONINI in the last 168 hours. BNP (last 3 results) Recent Labs    09/14/17 1527  PROBNP 57   HbA1C: No results for input(s): HGBA1C in the last 72 hours. CBG: No results for input(s): GLUCAP in the last 168 hours. Lipid Profile: No results for input(s): CHOL, HDL, LDLCALC, TRIG, CHOLHDL, LDLDIRECT in the last 72 hours. Thyroid Function Tests: No results for input(s): TSH, T4TOTAL, FREET4, T3FREE, THYROIDAB in the last 72 hours. Anemia Panel: No results for input(s): VITAMINB12, FOLATE, FERRITIN, TIBC, IRON, RETICCTPCT in the last 72 hours. Sepsis Labs: No results for input(s): PROCALCITON, LATICACIDVEN in the last 168 hours.  No results found for this or any previous visit (from the past 240 hour(s)).       Radiology Studies: Ct Abdomen Pelvis Wo Contrast  Result Date: 02/19/2018 CLINICAL DATA:  67 y/o M; abdominal pain, diarrhea, and severe vomiting. History of non-Hodgkin's lymphoma. EXAM: CT ABDOMEN AND PELVIS WITHOUT CONTRAST  TECHNIQUE: Multidetector CT imaging of the abdomen and pelvis was performed following the standard protocol without IV contrast. COMPARISON:  12/07/2011 CT abdomen and pelvis. FINDINGS: Lower chest: Trace pleural effusions and left lower lobe platelike atelectasis. Small hiatal hernia. Hepatobiliary: No focal liver abnormality is seen. No gallstones, gallbladder wall thickening, or biliary dilatation. Pancreas: Unremarkable. No pancreatic ductal dilatation or surrounding inflammatory changes. Spleen: Normal in size without focal abnormality. Adrenals/Urinary Tract: Stable 15 mm right adrenal adenoma. Normal left adrenal gland. No focal kidney lesion. No hydronephrosis. Stable mild diffuse bladder wall thickening and small diverticula. Stomach/Bowel: Mild diffuse dilatation of small bowel in the mid abdomen small amount of surrounding edema. No focal transition point identified. Large bowel is unremarkable. Normal appendix. Vascular/Lymphatic: Aortic atherosclerosis. No enlarged abdominal or pelvic lymph nodes. Reproductive: Mild prostate enlargement with small calcifications. Other: Stable small inguinal hernias containing fat. Musculoskeletal: No acute or significant osseous findings. IMPRESSION: 1. Multiple dilated loops of small bowel in the mid abdomen with mild surrounding edema may represent partial obstruction or enteritis. No discrete transition point identified. 2. Trace pleural effusions. 3. Small hiatal hernia. 4. Stable right  adrenal adenoma. 5. Stable mild diffuse bladder wall thickening and small diverticula, probably due to chronic outflow obstruction. Stable mild prostate enlargement. 6. Aortic atherosclerosis. Electronically Signed   By: [name] Garbe M.D.   On: 02/19/2018 06:55   Dg Abd Portable 1v  Result Date: 02/20/2018 CLINICAL DATA:  67 y/o  M; small-bowel obstruction. EXAM: PORTABLE ABDOMEN - 1 VIEW COMPARISON:  02/19/2018 abdomen radiographs. FINDINGS: Persistent dilatation of  small bowel without significant interval change. No radiopaque urinary stone disease identified. Right hemiabdomen partially excluded from field of view. Dextrocurvature and degenerative changes of the lumbar spine. IMPRESSION: Persistent dilatation of small bowel without significant interval change. Electronically Signed   By: [name] Garbe M.D.   On: 02/20/2018 06:02   Dg Abd Portable 1 View  Result Date: 02/19/2018 CLINICAL DATA:  Nasogastric tube repositioning. EXAM: PORTABLE ABDOMEN - 1 VIEW COMPARISON:  Film earlier today at 0925 hours. FINDINGS: Nasogastric tube has now been straightened with the tip projecting in the proximal body of the stomach. IMPRESSION: Nasogastric tube tip now lies in the body of the stomach. Electronically Signed   By: Aletta Edouard M.D.   On: 02/19/2018 10:19   Dg Abd Portable 1 View  Result Date: 02/19/2018 CLINICAL DATA:  Status post repositioning of nasogastric catheter EXAM: PORTABLE ABDOMEN - 1 VIEW COMPARISON:  Film from earlier in the same day. FINDINGS: The overall appearance of the nasogastric catheter is stable. A remains coiled upon itself with tip in the distal esophagus. IMPRESSION: Overall stable appearance of the nasogastric catheter looped within the stomach with the tip in the distal esophagus. Electronically Signed   By: Inez Catalina M.D.   On: 02/19/2018 09:51   Dg Abd Portable 1 View  Result Date: 02/19/2018 CLINICAL DATA:  Check nasogastric catheter placement EXAM: PORTABLE ABDOMEN - 1 VIEW COMPARISON:  02/19/2018 FINDINGS: Small bowel dilatation is noted consistent with obstruction/ileus. Nasogastric catheter is noted within the stomach although the catheter is coiled upon itself with the tip within the distal esophagus several cm above the gastroesophageal junction. This should be withdrawn and readvanced. IMPRESSION: Nasogastric catheter coiled within the stomach although the tip is noted in the distal esophagus several cm proximal to the  gastroesophageal junction. Electronically Signed   By: Inez Catalina M.D.   On: 02/19/2018 09:10        Scheduled Meds: . aspirin EC  81 mg Oral Daily  . atorvastatin  20 mg Oral Daily  . chlorproMAZINE  100 mg Oral QHS  . chlorproMAZINE  50 mg Oral 4 times per day  . cholecalciferol  5,000 Units Oral Daily  . cloZAPine  100 mg Oral BID  . clozapine  50 mg Oral QHS  . docusate sodium  100 mg Oral Daily  . escitalopram  10 mg Oral QAC breakfast  . heparin  5,000 Units Subcutaneous Q8H  . lamoTRIgine  50 mg Oral 3 times per day  . levothyroxine  100 mcg Oral QAC breakfast  . mouth rinse  15 mL Mouth Rinse BID  . omega-3 acid ethyl esters  1 g Oral BID  . polyethylene glycol  17 g Oral Daily  . senna  1 tablet Oral Daily   Continuous Infusions: . dextrose 5 % and 0.45% NaCl 75 mL/hr at 02/20/18 0106     LOS: 1 day    Time spent: [place], MD [facility]   If 7PM-7AM, please contact night-coverage [email or web address] Password [facility] 02/20/2018, 12:31 PM

## 2018-02-21 LAB — CBC WITH DIFFERENTIAL/PLATELET
Basophils Absolute: 0 K/uL (ref 0.0–0.1)
Basophils Relative: 0 %
Eosinophils Absolute: 0.3 10*3/uL (ref 0.0–0.7)
Eosinophils Relative: 4 %
HCT: 40.6 % (ref 39.0–52.0)
Hemoglobin: 13.1 g/dL (ref 13.0–17.0)
Lymphocytes Relative: 28 %
Lymphs Abs: 1.8 10*3/uL (ref 0.7–4.0)
MCH: 29.9 pg (ref 26.0–34.0)
MCHC: 32.3 g/dL (ref 30.0–36.0)
MCV: 92.7 fL (ref 78.0–100.0)
Monocytes Absolute: 0.9 10*3/uL (ref 0.1–1.0)
Monocytes Relative: 14 %
Neutro Abs: 3.4 K/uL (ref 1.7–7.7)
Neutrophils Relative %: 54 %
Platelets: 137 K/uL — ABNORMAL LOW (ref 150–400)
RBC: 4.38 MIL/uL (ref 4.22–5.81)
RDW: 14.3 % (ref 11.5–15.5)
WBC: 6.3 K/uL (ref 4.0–10.5)

## 2018-02-21 LAB — C DIFFICILE QUICK SCREEN W PCR REFLEX
C DIFFICLE (CDIFF) ANTIGEN: NEGATIVE
C Diff interpretation: NOT DETECTED
C Diff toxin: NEGATIVE

## 2018-02-21 MED ORDER — AMOXICILLIN-POT CLAVULANATE 875-125 MG PO TABS: 1.0000 | ORAL_TABLET | Freq: Two times a day (BID) | ORAL | 0 refills | Status: DC

## 2018-02-21 NOTE — Progress Notes (Signed)
[facility] called for transport.  Nurse given number to call report.  Kathrin Greathouse, Latanya Presser, MSW Clinical Social Worker  [phone] 02/21/2018  2:44 PM

## 2018-02-21 NOTE — Discharge Summary (Signed)
Physician Discharge Summary  [name]:[id number] DOB: [birth year] DOA: 02/19/2018  PCP: [name], FNP  Admit date: 02/19/2018 Discharge date: 02/21/2018  Admitted From: Home/group home Disposition: Home/group home  Recommendations for Outpatient Follow-up:  1. Follow up with PCP in 1-2 weeks 2. Please obtain BMP/CBC in one week   Home Health: No Equipment/Devices: No  Discharge Condition: Stable CODE STATUS: Full Diet recommendation: Heart Healthy  Brief/Interim Summary:  ##) Enteritis: Patient was admitted with abdominal pain, nausea, vomiting.  CT on admission showed SBO versus enteritis however patient was not having much emesis, continued to have bowel movements and exam is more consistent with enteritis.  Patient was started on oral Augmentin for enteritis and his diet was aggressively advanced.  He tolerated his diet well without any problems.  Patient was discharged home to complete a course of oral Augmentin for presumed infectious enteritis.  C. difficile was negative.  #) Schizoaffective disorder.  Patient was restarted on home chlorpromazine, clozapine, escitalopram, lamotrigine.  #) The hyperthyroidism: Patient was continued on home levothyroxine.  #) Disease hyperlipidemia: Patient was continued on aspirin 81 mg and atorvastatin.  #) Chronic kidney disease stage III: Patient's creatinine was stable if not slightly improved from baseline.  Discharge Diagnoses:  Principal Problem:   Small bowel obstruction ([place]) Active Problems:   CKD (chronic kidney disease), stage III (HCC)   Schizophrenia (HCC)   Non Hodgkin's lymphoma ([place])   Hypothyroidism    Discharge Instructions  Discharge Instructions    Call MD for:  difficulty breathing, headache or visual disturbances   Complete by:  As directed    Call MD for:  hives   Complete by:  As directed    Call MD for:  persistant nausea and vomiting   Complete by:  As directed    Call MD for:  redness,  tenderness, or signs of infection (pain, swelling, redness, odor or green/yellow discharge around incision site)   Complete by:  As directed    Call MD for:  severe uncontrolled pain   Complete by:  As directed    Call MD for:  temperature >100.4   Complete by:  As directed    Diet - low sodium heart healthy   Complete by:  As directed    Discharge instructions   Complete by:  As directed    Please follow-up with your PCP in 1 week.  Please complete all of your antibiotics.   Increase activity slowly   Complete by:  As directed      Allergies as of 02/21/2018      Reactions   Sulfamethoxazole Rash      Medication List    TAKE these medications   acetaminophen 325 MG tablet Commonly known as:  TYLENOL Take 650 mg by mouth every 6 (six) hours as needed for mild pain or moderate pain.   albuterol 108 (90 Base) MCG/ACT inhaler Commonly known as:  PROVENTIL HFA;VENTOLIN HFA Inhale 2 puffs into the lungs every 4 (four) hours as needed for wheezing or shortness of breath.   amoxicillin-clavulanate 875-125 MG tablet Commonly known as:  AUGMENTIN Take 1 tablet by mouth 2 (two) times daily.   aspirin EC 81 MG tablet Take 81 mg by mouth daily.   atorvastatin 20 MG tablet Commonly known as:  LIPITOR Take 20 mg by mouth daily.   chlorproMAZINE 50 MG tablet Commonly known as:  THORAZINE Take 50 mg 4 (four) times daily by mouth.   chlorproMAZINE 100 MG tablet Commonly  known as:  THORAZINE Take 100 mg at bedtime by mouth.   cloZAPine 100 MG tablet Commonly known as:  CLOZARIL Take 100 mg by mouth 2 (two) times daily.   clozapine 50 MG tablet Commonly known as:  CLOZARIL Take 50 mg by mouth at bedtime.   docusate sodium 100 MG capsule Commonly known as:  COLACE Take 100 mg by mouth daily.   escitalopram 10 MG tablet Commonly known as:  LEXAPRO Take 10 mg by mouth daily before breakfast.   Fish Oil 1000 MG Caps Take 4,000 mg by mouth daily.   lamoTRIgine 25 MG  tablet Commonly known as:  LAMICTAL Take 50 mg by mouth 3 (three) times daily.   levothyroxine 100 MCG tablet Commonly known as:  SYNTHROID, LEVOTHROID Take 100 mcg daily by mouth.   Nystatin Powd Apply 1 application 3 (three) times daily as needed topically (Rash).   polyethylene glycol packet Commonly known as:  MIRALAX / GLYCOLAX Take 17 g daily by mouth. Dissolve 17g in 8oz of water/juice and drink by mouth every day   senna 8.6 MG Tabs tablet Commonly known as:  SENOKOT Take 1 tablet by mouth daily.   VITAMIN D-3 PO Take 5,000 mg by mouth daily.      Contact information for after-discharge care    [facility] .   Service:  Group Home Contact information: [place] [phone]             Allergies  Allergen Reactions  . Sulfamethoxazole Rash    Consultations:  None   Procedures/Studies: Ct Abdomen Pelvis Wo Contrast  Result Date: 02/19/2018 CLINICAL DATA:  67 y/o M; abdominal pain, diarrhea, and severe vomiting. History of non-Hodgkin's lymphoma. EXAM: CT ABDOMEN AND PELVIS WITHOUT CONTRAST TECHNIQUE: Multidetector CT imaging of the abdomen and pelvis was performed following the standard protocol without IV contrast. COMPARISON:  12/07/2011 CT abdomen and pelvis. FINDINGS: Lower chest: Trace pleural effusions and left lower lobe platelike atelectasis. Small hiatal hernia. Hepatobiliary: No focal liver abnormality is seen. No gallstones, gallbladder wall thickening, or biliary dilatation. Pancreas: Unremarkable. No pancreatic ductal dilatation or surrounding inflammatory changes. Spleen: Normal in size without focal abnormality. Adrenals/Urinary Tract: Stable 15 mm right adrenal adenoma. Normal left adrenal gland. No focal kidney lesion. No hydronephrosis. Stable mild diffuse bladder wall thickening and small diverticula. Stomach/Bowel: Mild diffuse dilatation of small bowel in the  mid abdomen small amount of surrounding edema. No focal transition point identified. Large bowel is unremarkable. Normal appendix. Vascular/Lymphatic: Aortic atherosclerosis. No enlarged abdominal or pelvic lymph nodes. Reproductive: Mild prostate enlargement with small calcifications. Other: Stable small inguinal hernias containing fat. Musculoskeletal: No acute or significant osseous findings. IMPRESSION: 1. Multiple dilated loops of small bowel in the mid abdomen with mild surrounding edema may represent partial obstruction or enteritis. No discrete transition point identified. 2. Trace pleural effusions. 3. Small hiatal hernia. 4. Stable right adrenal adenoma. 5. Stable mild diffuse bladder wall thickening and small diverticula, probably due to chronic outflow obstruction. Stable mild prostate enlargement. 6. Aortic atherosclerosis. Electronically Signed   By: [name] Garbe M.D.   On: 02/19/2018 06:55   Dg Abd Portable 1v  Result Date: 02/20/2018 CLINICAL DATA:  67 y/o  M; small-bowel obstruction. EXAM: PORTABLE ABDOMEN - 1 VIEW COMPARISON:  02/19/2018 abdomen radiographs. FINDINGS: Persistent dilatation of small bowel without significant interval change. No radiopaque urinary stone disease identified. Right hemiabdomen partially excluded from field of  view. Dextrocurvature and degenerative changes of the lumbar spine. IMPRESSION: Persistent dilatation of small bowel without significant interval change. Electronically Signed   By: [name] Garbe M.D.   On: 02/20/2018 06:02   Dg Abd Portable 1 View  Result Date: 02/19/2018 CLINICAL DATA:  Nasogastric tube repositioning. EXAM: PORTABLE ABDOMEN - 1 VIEW COMPARISON:  Film earlier today at 0925 hours. FINDINGS: Nasogastric tube has now been straightened with the tip projecting in the proximal body of the stomach. IMPRESSION: Nasogastric tube tip now lies in the body of the stomach. Electronically Signed   By: Aletta Edouard M.D.   On:  02/19/2018 10:19   Dg Abd Portable 1 View  Result Date: 02/19/2018 CLINICAL DATA:  Status post repositioning of nasogastric catheter EXAM: PORTABLE ABDOMEN - 1 VIEW COMPARISON:  Film from earlier in the same day. FINDINGS: The overall appearance of the nasogastric catheter is stable. A remains coiled upon itself with tip in the distal esophagus. IMPRESSION: Overall stable appearance of the nasogastric catheter looped within the stomach with the tip in the distal esophagus. Electronically Signed   By: Inez Catalina M.D.   On: 02/19/2018 09:51   Dg Abd Portable 1 View  Result Date: 02/19/2018 CLINICAL DATA:  Check nasogastric catheter placement EXAM: PORTABLE ABDOMEN - 1 VIEW COMPARISON:  02/19/2018 FINDINGS: Small bowel dilatation is noted consistent with obstruction/ileus. Nasogastric catheter is noted within the stomach although the catheter is coiled upon itself with the tip within the distal esophagus several cm above the gastroesophageal junction. This should be withdrawn and readvanced. IMPRESSION: Nasogastric catheter coiled within the stomach although the tip is noted in the distal esophagus several cm proximal to the gastroesophageal junction. Electronically Signed   By: Inez Catalina M.D.   On: 02/19/2018 09:10       Subjective:   Discharge Exam: Vitals:   02/20/18 2056 02/21/18 0525  BP: (!) 116/58 113/65  Pulse: 100 86  Resp: 20 20  Temp: 98.6 F (37 C) 97.7 F (36.5 C)  SpO2: 94% 96%   Vitals:   02/20/18 0607 02/20/18 1338 02/20/18 2056 02/21/18 0525  BP: (!) 90/53 123/62 (!) 116/58 113/65  Pulse: 98 96 100 86  Resp: 18 16 20 20   Temp: 99 F (37.2 C) 97.7 F (36.5 C) 98.6 F (37 C) 97.7 F (36.5 C)  TempSrc: Oral Oral Oral Oral  SpO2: 92% 97% 94% 96%  Weight:      Height:        General exam: Appears calm and comfortable  Respiratory system: Clear to auscultation. Respiratory effort normal. Cardiovascular system: Regular rate and rhythm, no  murmurs Gastrointestinal system: Soft, mildly distended, nontender, plus bowel sounds, no organomegaly Central nervous system: Alert and oriented.  Spontaneously moving all extremities Extremities: No lower extremity edema Skin: No rashes on visible skin Psychiatry: Patient appears to be alert and oriented x4.  He understands that he is in the hospital for.  His judgment and insight appear intact however he appears to be quite slowed.       The results of significant diagnostics from this hospitalization (including imaging, microbiology, ancillary and laboratory) are listed below for reference.     Microbiology: Recent Results (from the past 240 hour(s))  C difficile quick scan w PCR reflex     Status: None   Collection Time: 02/21/18  5:37 AM  Result Value Ref Range Status   C Diff antigen NEGATIVE NEGATIVE Final   C Diff toxin NEGATIVE NEGATIVE Final  C Diff interpretation No C. difficile detected.  Final    Comment: Performed at [facility], [place] [address] [place], [place] [address]     Labs: BNP (last 3 results) No results for input(s): BNP in the last 8760 hours. Basic Metabolic Panel: Recent Labs  Lab 02/19/18 0310 02/20/18 0437  NA 138 139  K 4.7 3.6  CL 104 104  CO2 24 26  GLUCOSE 138* 160*  BUN 37* 34*  CREATININE 2.35* 1.99*  CALCIUM 8.9 8.6*   Liver Function Tests: Recent Labs  Lab 02/19/18 0310  AST 30  ALT 22  ALKPHOS 72  BILITOT 1.2  PROT 6.5  ALBUMIN 3.6   Recent Labs  Lab 02/19/18 0310  LIPASE 18   No results for input(s): AMMONIA in the last 168 hours. CBC: Recent Labs  Lab 02/19/18 0310 02/20/18 0437 02/21/18 0450  WBC 13.9* 7.5 6.3  NEUTROABS  --   --  3.4  HGB 15.0 13.5 13.1  HCT 46.6 42.1 40.6  MCV 93.8 92.7 92.7  PLT 141* 148* 137*   Cardiac Enzymes: No results for input(s): CKTOTAL, CKMB, CKMBINDEX, TROPONINI in the last 168 hours. BNP: Invalid input(s): POCBNP CBG: No results for input(s):  GLUCAP in the last 168 hours. D-Dimer No results for input(s): DDIMER in the last 72 hours. Hgb A1c No results for input(s): HGBA1C in the last 72 hours. Lipid Profile No results for input(s): CHOL, HDL, LDLCALC, TRIG, CHOLHDL, LDLDIRECT in the last 72 hours. Thyroid function studies No results for input(s): TSH, T4TOTAL, T3FREE, THYROIDAB in the last 72 hours.  Invalid input(s): FREET3 Anemia work up No results for input(s): VITAMINB12, FOLATE, FERRITIN, TIBC, IRON, RETICCTPCT in the last 72 hours. Urinalysis    Component Value Date/Time   COLORURINE YELLOW 02/19/2018 0518   APPEARANCEUR HAZY (A) 02/19/2018 0518   LABSPEC 1.024 02/19/2018 0518   PHURINE 5.0 02/19/2018 0518   GLUCOSEU NEGATIVE 02/19/2018 0518   HGBUR SMALL (A) 02/19/2018 0518   BILIRUBINUR NEGATIVE 02/19/2018 0518   KETONESUR NEGATIVE 02/19/2018 0518   PROTEINUR NEGATIVE 02/19/2018 0518   UROBILINOGEN 0.2 05/09/2015 1740   NITRITE NEGATIVE 02/19/2018 0518   LEUKOCYTESUR NEGATIVE 02/19/2018 0518   Sepsis Labs Invalid input(s): PROCALCITONIN,  WBC,  LACTICIDVEN Microbiology Recent Results (from the past 240 hour(s))  C difficile quick scan w PCR reflex     Status: None   Collection Time: 02/21/18  5:37 AM  Result Value Ref Range Status   C Diff antigen NEGATIVE NEGATIVE Final   C Diff toxin NEGATIVE NEGATIVE Final   C Diff interpretation No C. difficile detected.  Final    Comment: Performed at [facility], [place] [address], [place], [place] [address]     Time coordinating discharge: Over 30 minutes  SIGNED:   Cristy Folks, MD  [facility] 02/21/2018, 1:14 PM  If 7PM-7AM, please contact night-coverage [email or web address] Password [email or web address]

## 2018-02-21 NOTE — Progress Notes (Signed)
Called report to group home.awaiting transport

## 2018-02-21 NOTE — Progress Notes (Signed)
Ptar transferred pt off unit at 2100

## 2018-02-21 NOTE — Discharge Instructions (Signed)

## 2018-02-21 NOTE — Clinical Social Work Note (Signed)
Clinical Social Work Assessment  Patient Details  Name: [name] MRN: [id number] Date of Birth: 12-Nov-1951  Date of referral:  02/21/18               Reason for consult:  Facility Placement                Permission sought to share information with:  Facility Art therapist granted to share information::     Name::      [name]   Agency::     [facility].  Relationship::  Legal Guardian   Contact Information:    [phone]  Housing/Transportation Living arrangements for the past 2 months:  [place] of Information:  Other (Comment Required)(Legal Guardian ) Patient Interpreter Needed:  None Criminal Activity/Legal Involvement Pertinent to Current Situation/Hospitalization:  No - Comment as needed Significant Relationships:  Warehouse manager, Other(Comment)(Legal Guardian ) Lives with:  Facility Resident Do you feel safe going back to the place where you live?  Yes Need for family participation in patient care:  Yes (Comment)  Care giving concerns:   Patient admitted for Nausea, Vomiting and Diarrhea.    Social Worker assessment / plan:  CSW spoke to patient Legal Guardian [name] about the patient discharge plan.  She reports the patient will return to [facility]. She reports she is still concern the patient needs a high level of care, long term placement at SNF. She reports they have been trying to place the patient from the [facility]. She reports his mental health diagnosis has been a barrier. Patient ambulates w/o assistance-nurse reports he can be "wobbly on his feet." The patient needs assistance with bathing and dressing.   The facility request the patient return with FL2 indication patient current level of functioning.   Plan: Return Group Home   Employment status:  Disabled (Comment on whether or not currently receiving Disability) Insurance information:  Medicare PT Recommendations:   Not assessed at this time Information / Referral to community resources:   Group Home  Patient/Family's Response to care:  Agreeable and Responding to care.   Patient/Family's Understanding of and Emotional Response to Diagnosis, Current Treatment, and Prognosis:  Patient guardian has good understanding of patient treatment plan for this admission.   Emotional Assessment Appearance:  Appears stated age Attitude/Demeanor/Rapport:    Affect (typically observed):  Calm Orientation:  Oriented to Self, Oriented to Place, Oriented to  Time, Oriented to Situation Alcohol / Substance use:  Not Applicable Psych involvement (Current and /or in the community):  No (Comment)  Discharge Needs  Concerns to be addressed:  Discharge Planning Concerns Readmission within the last 30 days:  No Current discharge risk:  None Barriers to Discharge:  No Barriers Identified   Lia Hopping, LCSW 02/21/2018, 12:26 PM

## 2018-02-21 NOTE — NC FL2 (Signed)
[place] LEVEL OF CARE SCREENING TOOL     IDENTIFICATION  Patient Name: [name] Birthdate: [birth year] Sex: male Admission Date (Current Location): 02/19/2018  [facility] and [place] Number:  Herbalist and Address:  [facility],  [place] [address] [place]      Provider Number: [id number]  Attending Physician Name and Address:  Cristy Folks, MD  Relative Name and Phone Number:       Current Level of Care: Hospital Recommended Level of Care: Other (Comment)(Group Home ) Prior Approval Number:    Date Approved/Denied:   PASRR Number:    Discharge Plan: Other (Comment)(Group Home)    Current Diagnoses: Patient Active Problem List   Diagnosis Date Noted  . Small bowel obstruction ([place]) 02/19/2018  . Cellulitis 12/02/2017  . Severe left ventricular hypertrophy 10/21/2017  . Stage II pressure injury of buttocks 10/03/2017  . Obesity (BMI 30-39.9) 10/03/2017  . Hyperlipidemia 10/03/2017  . Hypothyroidism 10/03/2017  . RBBB 09/17/2017  . Lobar pneumonia ([place]) 02/12/2016  . Cellulitis of right lower extremity 03/28/2015  . Sepsis ([place]) 03/27/2015  . Prolonged QT interval   . CLL (chronic lymphocytic leukemia) ([place]) 09/12/2013  . Non Hodgkin's lymphoma ([place]) 09/12/2013  . Leukocytosis 12/24/2012  . Anemia 12/24/2012  . CKD (chronic kidney disease), stage III ([place]) 12/24/2012  . Schizophrenia ([place]) 12/24/2012    Orientation RESPIRATION BLADDER Height & Weight     Self, Time, Situation, Place  Normal Continent Weight: 271 lb 2.7 oz (123 kg) Height:  6' (182.9 cm)  BEHAVIORAL SYMPTOMS/MOOD NEUROLOGICAL BOWEL NUTRITION STATUS      Continent Diet(Regular )  AMBULATORY STATUS COMMUNICATION OF NEEDS Skin   Independent Verbally                         Personal Care Assistance Level of Assistance  Bathing, Dressing, Feeding Bathing Assistance: Limited assistance Feeding assistance: Independent Dressing  Assistance: Limited assistance     Functional Limitations Info  Sight, Hearing, Speech Sight Info: Adequate Hearing Info: Adequate Speech Info: Adequate    SPECIAL CARE FACTORS FREQUENCY                       Contractures Contractures Info: Not present    Additional Factors Info  Code Status, Allergies, Psychotropic Code Status Info: Fullcode Allergies Info: Allergies: Sulfamethoxazole Psychotropic Info: Yes-See Medication List         Current Medications (02/21/2018):  This is the current hospital active medication list Current Facility-Administered Medications  Medication Dose Route Frequency Provider Last Rate Last Dose  . albuterol (PROVENTIL) (2.5 MG/3ML) 0.083% nebulizer solution 2.5 mg  2.5 mg Nebulization Q4H PRN Short, Mackenzie, MD      . amoxicillin-clavulanate (AUGMENTIN) 875-125 MG per tablet 1 tablet  1 tablet Oral BID Purohit, Konrad Dolores, MD   1 tablet at 02/21/18 0952  . aspirin EC tablet 81 mg  81 mg Oral Daily Purohit, Shrey C, MD   81 mg at 02/21/18 0953  . atorvastatin (LIPITOR) tablet 20 mg  20 mg Oral Daily Purohit, Shrey C, MD   20 mg at 02/21/18 0952  . chlorproMAZINE (THORAZINE) tablet 100 mg  100 mg Oral QHS Purohit, Shrey C, MD   100 mg at 02/20/18 2120  . chlorproMAZINE (THORAZINE) tablet 50 mg  50 mg Oral 4 times per day Cristy Folks, MD   50 mg at 02/21/18 0951  .  cholecalciferol (VITAMIN D) tablet 5,000 Units  5,000 Units Oral Daily Purohit, Konrad Dolores, MD   5,000 Units at 02/21/18 0951  . cloZAPine (CLOZARIL) tablet 100 mg  100 mg Oral BID Purohit, Shrey C, MD   100 mg at 02/21/18 0952  . cloZAPine (CLOZARIL) tablet 50 mg  50 mg Oral QHS Purohit, Shrey C, MD   50 mg at 02/20/18 2121  . docusate sodium (COLACE) capsule 100 mg  100 mg Oral Daily Purohit, Shrey C, MD   100 mg at 02/21/18 0953  . escitalopram (LEXAPRO) tablet 10 mg  10 mg Oral QAC breakfast Purohit, Shrey C, MD   10 mg at 02/21/18 0952  . heparin injection 5,000 Units  5,000 Units  Subcutaneous Christiana Pellant, MD   5,000 Units at 02/21/18 0531  . lamoTRIgine (LAMICTAL) tablet 50 mg  50 mg Oral 3 times per day Cristy Folks, MD   50 mg at 02/21/18 0953  . levothyroxine (SYNTHROID, LEVOTHROID) tablet 100 mcg  100 mcg Oral QAC breakfast Purohit, Konrad Dolores, MD   100 mcg at 02/21/18 0952  . MEDLINE mouth rinse  15 mL Mouth Rinse BID Janece Canterbury, MD   15 mL at 02/19/18 2143  . omega-3 acid ethyl esters (LOVAZA) capsule 1 g  1 g Oral BID Purohit, Shrey C, MD   1 g at 02/21/18 0952  . ondansetron (ZOFRAN) injection 4 mg  4 mg Intravenous Once PRN Short, Mackenzie, MD      . polyethylene glycol (MIRALAX / GLYCOLAX) packet 17 g  17 g Oral Daily Purohit, Shrey C, MD   17 g at 02/20/18 0916  . promethazine (PHENERGAN) tablet 12.5 mg  12.5 mg Oral Q6H PRN Short, Mackenzie, MD      . senna (SENOKOT) tablet 8.6 mg  1 tablet Oral Daily Purohit, Shrey C, MD   8.6 mg at 02/21/18 5520     Discharge Medications: Please see discharge summary for a list of discharge medications.  Relevant Imaging Results:  Relevant Lab Results:   Additional Information SS#: [id number]. Lives at the [facility] in [place].  Lia Hopping, LCSW

## 2018-02-22 ENCOUNTER — Institutional Professional Consult (permissible substitution): Payer: Self-pay | Admitting: Pulmonary Disease

## 2018-05-30 ENCOUNTER — Emergency Department (HOSPITAL_COMMUNITY)
Admission: EM | Admit: 2018-05-30 | Discharge: 2018-05-30 | Payer: Medicare Other | Attending: Emergency Medicine | Admitting: Emergency Medicine

## 2018-05-30 ENCOUNTER — Emergency Department (HOSPITAL_COMMUNITY): Payer: Medicare Other

## 2018-05-30 DIAGNOSIS — I130 Hypertensive heart and chronic kidney disease with heart failure and stage 1 through stage 4 chronic kidney disease, or unspecified chronic kidney disease: Secondary | ICD-10-CM | POA: Diagnosis not present

## 2018-05-30 DIAGNOSIS — N183 Chronic kidney disease, stage 3 (moderate): Secondary | ICD-10-CM | POA: Diagnosis not present

## 2018-05-30 DIAGNOSIS — R2241 Localized swelling, mass and lump, right lower limb: Secondary | ICD-10-CM | POA: Diagnosis not present

## 2018-05-30 DIAGNOSIS — I509 Heart failure, unspecified: Secondary | ICD-10-CM | POA: Diagnosis not present

## 2018-05-30 DIAGNOSIS — S52515A Nondisplaced fracture of left radial styloid process, initial encounter for closed fracture: Secondary | ICD-10-CM

## 2018-05-30 DIAGNOSIS — S6992XA Unspecified injury of left wrist, hand and finger(s), initial encounter: Secondary | ICD-10-CM | POA: Diagnosis present

## 2018-05-30 DIAGNOSIS — W010XXA Fall on same level from slipping, tripping and stumbling without subsequent striking against object, initial encounter: Secondary | ICD-10-CM | POA: Insufficient documentation

## 2018-05-30 DIAGNOSIS — J449 Chronic obstructive pulmonary disease, unspecified: Secondary | ICD-10-CM | POA: Insufficient documentation

## 2018-05-30 DIAGNOSIS — S62102A Fracture of unspecified carpal bone, left wrist, initial encounter for closed fracture: Secondary | ICD-10-CM | POA: Diagnosis not present

## 2018-05-30 DIAGNOSIS — Z8572 Personal history of non-Hodgkin lymphomas: Secondary | ICD-10-CM | POA: Insufficient documentation

## 2018-05-30 DIAGNOSIS — Y92129 Unspecified place in nursing home as the place of occurrence of the external cause: Secondary | ICD-10-CM | POA: Insufficient documentation

## 2018-05-30 DIAGNOSIS — Y999 Unspecified external cause status: Secondary | ICD-10-CM | POA: Insufficient documentation

## 2018-05-30 DIAGNOSIS — R0781 Pleurodynia: Secondary | ICD-10-CM | POA: Insufficient documentation

## 2018-05-30 DIAGNOSIS — Y9301 Activity, walking, marching and hiking: Secondary | ICD-10-CM | POA: Diagnosis not present

## 2018-05-30 DIAGNOSIS — E039 Hypothyroidism, unspecified: Secondary | ICD-10-CM | POA: Insufficient documentation

## 2018-05-30 DIAGNOSIS — W19XXXA Unspecified fall, initial encounter: Secondary | ICD-10-CM

## 2018-05-30 DIAGNOSIS — Z7982 Long term (current) use of aspirin: Secondary | ICD-10-CM | POA: Insufficient documentation

## 2018-05-30 DIAGNOSIS — Z79899 Other long term (current) drug therapy: Secondary | ICD-10-CM | POA: Insufficient documentation

## 2018-05-30 NOTE — Discharge Instructions (Signed)
Your x-ray today showed evidence of a small radial styloid wrist fracture.  It does not appear to be displaced.  The hand team recommended placement of a removable splint to be worn at all times aside from showering.  Please call them to follow-up as they recommend.  Please use your home pain medications.  If any symptoms change or worsen, please return to the nearest emergency department.

## 2018-05-30 NOTE — ED Notes (Signed)
Bed: WA01 Expected date:  Expected time:  Means of arrival:  Comments: 67 yo M/ Fall-rib pain-nonambulatory

## 2018-05-30 NOTE — ED Provider Notes (Signed)
[facility] DEPT Provider Note   CSN: [id number] Arrival date & time: 05/30/18  1929     History   Chief Complaint Chief Complaint  Patient presents with  . Wrist Pain    HPI [name] is a 67 y.o. male.  The history is provided by the patient and medical records.  Wrist Pain  This is a new problem. The current episode started 6 to 12 hours ago. The problem occurs constantly. The problem has not changed since onset.Pertinent negatives include no chest pain, no abdominal pain, no headaches and no shortness of breath. The symptoms are aggravated by bending. Nothing relieves the symptoms. He has tried nothing for the symptoms. The treatment provided no relief.    Past Medical History:  Diagnosis Date  . Cellulitis   . CHF (congestive heart failure) ([place])   . Chronic kidney disease 09/20/2008  . Chronic lymphocytic leukemia ([place])   . cll dx'd 10/2003   no rx  . CLL (chronic lymphoblastic leukemia) 10/2003  . COPD (chronic obstructive pulmonary disease) ([place])   . GERD (gastroesophageal reflux disease)   . Hiatal hernia   . Hyperlipemia   . Hypertension   . Lower extremity edema   . NHL (non-Hodgkin's lymphoma) ([place]) dx'd 08/2008 rt groin   chemo comp 08/2008  . Non Hodgkin's lymphoma ([place]) 08/2008  . Non Hodgkin's lymphoma ([place])   . Paranoid schizophrenia ([place])   . Prolonged Q-T interval on ECG   . RBBB 09/17/2017  . Schizoaffective disorder ([place])   . Sepsis ([place])   . Severe left ventricular hypertrophy 10/21/2017  . Vitamin D deficiency     Patient Active Problem List   Diagnosis Date Noted  . Small bowel obstruction ([place]) 02/19/2018  . Cellulitis 12/02/2017  . Severe left ventricular hypertrophy 10/21/2017  . Stage II pressure injury of buttocks 10/03/2017  . Obesity (BMI 30-39.9) 10/03/2017  . Hyperlipidemia 10/03/2017  . Hypothyroidism 10/03/2017  . RBBB 09/17/2017  . Lobar pneumonia ([place]) 02/12/2016  . Cellulitis of  right lower extremity 03/28/2015  . Sepsis ([place]) 03/27/2015  . Prolonged QT interval   . CLL (chronic lymphocytic leukemia) ([place]) 09/12/2013  . Non Hodgkin's lymphoma ([place]) 09/12/2013  . Leukocytosis 12/24/2012  . Anemia 12/24/2012  . CKD (chronic kidney disease), stage III ([place]) 12/24/2012  . Schizophrenia ([place]) 12/24/2012    Past Surgical History:  Procedure Laterality Date  . LYMPH NODE BIOPSY          Home Medications    Prior to Admission medications   Medication Sig Start Date End Date Taking? Authorizing Provider  acetaminophen (TYLENOL) 325 MG tablet Take 650 mg by mouth every 6 (six) hours as needed for mild pain or moderate pain.    [provider]  albuterol (PROVENTIL HFA;VENTOLIN HFA) 108 (90 Base) MCG/ACT inhaler Inhale 2 puffs into the lungs every 4 (four) hours as needed for wheezing or shortness of breath.    [provider]  amoxicillin-clavulanate (AUGMENTIN) 875-125 MG tablet Take 1 tablet by mouth 2 (two) times daily. 02/21/18   Purohit, Konrad Dolores, MD  aspirin EC 81 MG tablet Take 81 mg by mouth daily.    [provider]  atorvastatin (LIPITOR) 20 MG tablet Take 20 mg by mouth daily.    [provider]  chlorproMAZINE (THORAZINE) 100 MG tablet Take 100 mg at bedtime by mouth.     [provider]  chlorproMAZINE (THORAZINE) 50 MG tablet Take 50 mg 4 (four) times  daily by mouth.     [provider]  Cholecalciferol (VITAMIN D-3 PO) Take 5,000 mg by mouth daily.    [provider]  cloZAPine (CLOZARIL) 100 MG tablet Take 100 mg by mouth 2 (two) times daily.     [provider]  clozapine (CLOZARIL) 50 MG tablet Take 50 mg by mouth at bedtime.     [provider]  docusate sodium (COLACE) 100 MG capsule Take 100 mg by mouth daily.    [provider]  escitalopram (LEXAPRO) 10 MG tablet Take 10 mg by mouth daily before breakfast.     [provider]  lamoTRIgine (LAMICTAL)  25 MG tablet Take 50 mg by mouth 3 (three) times daily.     [provider]  levothyroxine (SYNTHROID, LEVOTHROID) 100 MCG tablet Take 100 mcg daily by mouth.     [provider]  Nystatin POWD Apply 1 application 3 (three) times daily as needed topically (Rash).    [provider]  Omega-3 Fatty Acids (FISH OIL) 1000 MG CAPS Take 4,000 mg by mouth daily.    [provider]  polyethylene glycol (MIRALAX / GLYCOLAX) packet Take 17 g daily by mouth. Dissolve 17g in 8oz of water/juice and drink by mouth every day    [provider]  senna (SENOKOT) 8.6 MG TABS tablet Take 1 tablet by mouth daily.    [provider]    Family History Family History  Problem Relation Age of Onset  . Alcohol abuse Father     Social History Social History   Tobacco Use  . Smoking status: Never Smoker  . Smokeless tobacco: Never Used  Substance Use Topics  . Alcohol use: No  . Drug use: No     Allergies   Sulfamethoxazole   Review of Systems Review of Systems  Constitutional: Negative for activity change, chills, diaphoresis, fatigue and fever.  HENT: Negative for congestion and rhinorrhea.   Eyes: Negative for visual disturbance.  Respiratory: Negative for cough, chest tightness, shortness of breath, wheezing and stridor.   Cardiovascular: Negative for chest pain, palpitations and leg swelling.  Gastrointestinal: Negative for abdominal distention, abdominal pain, blood in stool, constipation, diarrhea, nausea and vomiting.  Genitourinary: Negative for difficulty urinating, dysuria and flank pain.  Musculoskeletal: Negative for back pain and gait problem.  Skin: Negative for rash and wound.  Neurological: Negative for dizziness, weakness, light-headedness and headaches.  Psychiatric/Behavioral: Negative for agitation.  All other systems reviewed and are negative.    Physical Exam Updated Vital Signs BP 122/68   Pulse 86   Resp (!) 21    SpO2 97%   Physical Exam  Constitutional: He is oriented to person, place, and time. He appears well-developed and well-nourished. No distress.  HENT:  Head: Normocephalic.  Nose: Nose normal.  Mouth/Throat: Oropharynx is clear and moist. No oropharyngeal exudate.  Eyes: Pupils are equal, round, and reactive to light. Conjunctivae and EOM are normal.  Neck: Normal range of motion.  Cardiovascular: Normal rate, regular rhythm and intact distal pulses.  Pulmonary/Chest: Effort normal and breath sounds normal. No respiratory distress. He exhibits no tenderness.  Abdominal: Soft. Bowel sounds are normal. He exhibits no distension. There is no tenderness.  Musculoskeletal: He exhibits tenderness. He exhibits no edema or deformity.       Left wrist: He exhibits tenderness. He exhibits normal range of motion, no swelling, no effusion, no crepitus, no deformity and no laceration.       Arms: Symmetric  radial pulses, symmetric grip strength, normal capillary refill in all fingers.  Tenderness present in the left wrist, no snuffbox tenderness.  Normal range of motion of fingers and wrist.  Elbow exam unremarkable and shoulder exam unremarkable.  Neurological: He is alert and oriented to person, place, and time. No sensory deficit. He exhibits normal muscle tone.  Skin: Skin is warm. Capillary refill takes less than 2 seconds. He is not diaphoretic. No erythema. No pallor.  Nursing note and vitals reviewed.    ED Treatments / Results  Labs (all labs ordered are listed, but only abnormal results are displayed) Labs Reviewed - No data to display  EKG None  Radiology Dg Wrist Complete Left  Addendum Date: 05/30/2018   ADDENDUM REPORT: 05/30/2018 20:40 ADDENDUM: In the impression it should state: Acute, closed, nondisplaced styloid fracture of the radius. Adjacent soft tissue swelling. Electronically Signed   By: Ashley Royalty M.D.   On: 05/30/2018 20:40   Result Date: 05/30/2018 CLINICAL DATA:   Left hand and wrist pain after fall earlier today. EXAM: LEFT WRIST - COMPLETE 3+ VIEW COMPARISON:  None. FINDINGS: With acute nondisplaced fracture of the radial styloid is noted with adjacent soft tissue swelling. The carpal bones and distal ulna appear intact. No joint dislocation. Well corticated ossific density projects dorsal to the carpal bones and may reflect a small phlebolith, calcific tendinopathy or stigmata of old remote trauma IMPRESSION: Acute, closed, nondisplaced dilated fracture of the radius. Adjacent soft tissue swelling. Electronically Signed: By: Ashley Royalty M.D. On: 05/30/2018 20:37   Dg Hand Complete Left  Result Date: 05/30/2018 CLINICAL DATA:  Left hand pain after fall today. EXAM: LEFT HAND - COMPLETE 3+ VIEW COMPARISON:  None. FINDINGS: There is an acute nondisplaced styloid fracture of the radius with soft tissue swelling. The carpal bones are intact. No acute fracture of the first through fifth digits. Well corticated ossification projects over the dorsum of the wrist likely representing small phlebolith, stigmata of old remote trauma or potentially tendinopathy. IMPRESSION: Acute nondisplaced styloid fracture of the radius. Adjacent soft tissue swelling is noted. Electronically Signed   By: Ashley Royalty M.D.   On: 05/30/2018 20:40    Procedures Procedures (including critical care time)  Medications Ordered in ED Medications - No data to display   Initial Impression / Assessment and Plan / ED Course  I have reviewed the triage vital signs and the nursing notes.  Pertinent labs & imaging results that were available during my care of the patient were reviewed by me and considered in my medical decision making (see chart for details).     IFEANYICHUKWU WICKHAM is a 67 y.o. male with a past medical history significant for schizophrenia, CLL, non-Hodgkin's lymphoma, CHF, COPD, prior SBO, and lymphedema who presents with left wrist pain after a fall.  Patient reports that at  approximately 2 PM today he was walking without a walker in his nursing facility and entering his room when he got tripped up and fell on outstretched left hand.  He denies hitting his head, neck, chest abdomen or pelvis.  He denies any pain anywhere aside from his left wrist.  He denies pain in the hand or snuffbox tenderness.  He denies numbness, tingling, or weakness of the hand.  He denies any pain in the elbow shoulder or other locations of discomfort.  He describes it as a 7 out of 10 in pain and has not taken medicines to help it.  He denies any prior  fractures in his wrist.    On exam, patient has pain with left wrist manipulation and both flexion/extension of the wrist.  Patient has normal grip strength bilaterally.  Normal sensation and good capillary refill.  Symmetric radial pulses.  No snuffbox tenderness.  Elbow has normal range motion without pain as the shoulder.  Chest was nontender and lungs were clear.  Abdomen was nontender and back was nontender.  Patient has swelling in his right leg which he reports he has had for the last 8 years and it is unchanged.  He denies any new leg pains or problems.  Given his fall, will obtain x-rays of the left wrist and hand.  Suspect patient has a sprain as there is no deformity seen however we will follow-up on imaging.  Anticipate patient will follow-up with PCP.   X-ray showed nondisplaced radial styloid fracture.  Patient was reassessed and this is the location of his tenderness.    Orthopedic hand surgery team was called and spoke with Dr. Grandville Silos.  He recommended a removal wrist splint and he will be seen in clinic in follow-up.  Patient agreed with plan of care and will continue home pain regimen.  Patient had no other questions or concerns and was discharged in good condition.   Final Clinical Impressions(s) / ED Diagnoses   Final diagnoses:  Fall, initial encounter  Closed fracture of left wrist, initial encounter  Closed nondisplaced  fracture of styloid process of left radius, initial encounter    ED Discharge Orders    None      Clinical Impression: 1. Fall, initial encounter   2. Closed fracture of left wrist, initial encounter   3. Closed nondisplaced fracture of styloid process of left radius, initial encounter     Disposition: Discharge  Condition: Good  I have discussed the results, Dx and Tx plan with the pt(& family if present). He/she/they expressed understanding and agree(s) with the plan. Discharge instructions discussed at great length. Strict return precautions discussed and pt &/or family have verbalized understanding of the instructions. No further questions at time of discharge.    Discharge Medication List as of 05/30/2018 10:48 PM      Follow Up: Milly Jakob, MD [place] [address] [phone]  Call    [facility] [address] [id number] [place]       Neev Mcmains, Gwenyth Allegra, MD 05/31/18 [phone]

## 2018-05-30 NOTE — ED Triage Notes (Signed)
Pt BIB [facility] from group home. Pt has pain in left wrist from a fall that happened around 1400 today. Facility called because the pain has gotten worse since the fall. No deformity, PMS in tact. Pt uses walker to ambulate.

## 2018-07-17 ENCOUNTER — Inpatient Hospital Stay (HOSPITAL_COMMUNITY)
Admission: EM | Admit: 2018-07-17 | Discharge: 2018-07-20 | DRG: 872 | Disposition: A | Discharge status: Home / Self Care | Payer: Medicare Other | Attending: Family Medicine | Admitting: Family Medicine

## 2018-07-17 ENCOUNTER — Other Ambulatory Visit: Payer: Self-pay

## 2018-07-17 ENCOUNTER — Emergency Department (HOSPITAL_COMMUNITY): Payer: Medicare Other

## 2018-07-17 DIAGNOSIS — E039 Hypothyroidism, unspecified: Secondary | ICD-10-CM | POA: Diagnosis not present

## 2018-07-17 DIAGNOSIS — N183 Chronic kidney disease, stage 3 unspecified: Secondary | ICD-10-CM | POA: Diagnosis present

## 2018-07-17 DIAGNOSIS — I509 Heart failure, unspecified: Secondary | ICD-10-CM | POA: Diagnosis present

## 2018-07-17 DIAGNOSIS — D72829 Elevated white blood cell count, unspecified: Secondary | ICD-10-CM | POA: Diagnosis present

## 2018-07-17 DIAGNOSIS — N179 Acute kidney failure, unspecified: Secondary | ICD-10-CM | POA: Diagnosis present

## 2018-07-17 DIAGNOSIS — I130 Hypertensive heart and chronic kidney disease with heart failure and stage 1 through stage 4 chronic kidney disease, or unspecified chronic kidney disease: Secondary | ICD-10-CM | POA: Diagnosis present

## 2018-07-17 DIAGNOSIS — L03115 Cellulitis of right lower limb: Secondary | ICD-10-CM

## 2018-07-17 DIAGNOSIS — J449 Chronic obstructive pulmonary disease, unspecified: Secondary | ICD-10-CM | POA: Diagnosis present

## 2018-07-17 DIAGNOSIS — F200 Paranoid schizophrenia: Secondary | ICD-10-CM | POA: Diagnosis present

## 2018-07-17 DIAGNOSIS — Z811 Family history of alcohol abuse and dependence: Secondary | ICD-10-CM

## 2018-07-17 DIAGNOSIS — F209 Schizophrenia, unspecified: Secondary | ICD-10-CM | POA: Diagnosis not present

## 2018-07-17 DIAGNOSIS — Z7982 Long term (current) use of aspirin: Secondary | ICD-10-CM

## 2018-07-17 DIAGNOSIS — E785 Hyperlipidemia, unspecified: Secondary | ICD-10-CM | POA: Diagnosis not present

## 2018-07-17 DIAGNOSIS — E872 Acidosis: Secondary | ICD-10-CM | POA: Diagnosis present

## 2018-07-17 DIAGNOSIS — K219 Gastro-esophageal reflux disease without esophagitis: Secondary | ICD-10-CM | POA: Diagnosis present

## 2018-07-17 DIAGNOSIS — Z79899 Other long term (current) drug therapy: Secondary | ICD-10-CM

## 2018-07-17 DIAGNOSIS — D696 Thrombocytopenia, unspecified: Secondary | ICD-10-CM | POA: Diagnosis present

## 2018-07-17 DIAGNOSIS — Z856 Personal history of leukemia: Secondary | ICD-10-CM | POA: Diagnosis not present

## 2018-07-17 DIAGNOSIS — R109 Unspecified abdominal pain: Secondary | ICD-10-CM | POA: Diagnosis present

## 2018-07-17 DIAGNOSIS — A419 Sepsis, unspecified organism: Secondary | ICD-10-CM

## 2018-07-17 DIAGNOSIS — M7989 Other specified soft tissue disorders: Secondary | ICD-10-CM | POA: Diagnosis not present

## 2018-07-17 DIAGNOSIS — Z8572 Personal history of non-Hodgkin lymphomas: Secondary | ICD-10-CM | POA: Diagnosis not present

## 2018-07-17 DIAGNOSIS — R609 Edema, unspecified: Secondary | ICD-10-CM | POA: Diagnosis not present

## 2018-07-17 LAB — CBC WITH DIFFERENTIAL/PLATELET
Basophils Absolute: 0 10*3/uL (ref 0.0–0.1)
Basophils Relative: 0 %
Eosinophils Absolute: 0 10*3/uL (ref 0.0–0.7)
Eosinophils Relative: 0 %
HCT: 42.1 % (ref 39.0–52.0)
Hemoglobin: 13.9 g/dL (ref 13.0–17.0)
LYMPHS ABS: 0.6 10*3/uL — AB (ref 0.7–4.0)
LYMPHS PCT: 3 %
MCH: 30.8 pg (ref 26.0–34.0)
MCHC: 33 g/dL (ref 30.0–36.0)
MCV: 93.3 fL (ref 78.0–100.0)
Monocytes Absolute: 0.3 10*3/uL (ref 0.1–1.0)
Monocytes Relative: 1 %
NEUTROS ABS: 16.5 10*3/uL — AB (ref 1.7–7.7)
NEUTROS PCT: 96 %
PLATELETS: 160 10*3/uL (ref 150–400)
RBC: 4.51 MIL/uL (ref 4.22–5.81)
RDW: 14.1 % (ref 11.5–15.5)
WBC: 17.4 10*3/uL — ABNORMAL HIGH (ref 4.0–10.5)

## 2018-07-17 LAB — PROTIME-INR
INR: 0.98
PROTHROMBIN TIME: 12.9 s (ref 11.4–15.2)

## 2018-07-17 LAB — COMPREHENSIVE METABOLIC PANEL
ALK PHOS: 92 U/L (ref 38–126)
ALT: 18 U/L (ref 0–44)
ANION GAP: 10 (ref 5–15)
AST: 25 U/L (ref 15–41)
Albumin: 4.1 g/dL (ref 3.5–5.0)
BILIRUBIN TOTAL: 0.4 mg/dL (ref 0.3–1.2)
BUN: 21 mg/dL (ref 8–23)
CO2: 25 mmol/L (ref 22–32)
CREATININE: 1.95 mg/dL — AB (ref 0.61–1.24)
Calcium: 9.2 mg/dL (ref 8.9–10.3)
Chloride: 107 mmol/L (ref 98–111)
GFR, EST AFRICAN AMERICAN: 40 mL/min — AB (ref >60)
GFR, EST NON AFRICAN AMERICAN: 34 mL/min — AB (ref >60)
GLUCOSE: 141 mg/dL — AB (ref 70–99)
Potassium: 4.3 mmol/L (ref 3.5–5.1)
SODIUM: 142 mmol/L (ref 135–145)
Total Protein: 6.3 g/dL — ABNORMAL LOW (ref 6.5–8.1)

## 2018-07-17 LAB — URINALYSIS, ROUTINE W REFLEX MICROSCOPIC
Bilirubin Urine: NEGATIVE
Glucose, UA: NEGATIVE mg/dL
Ketones, ur: NEGATIVE mg/dL
Leukocytes, UA: NEGATIVE
Nitrite: NEGATIVE
PH: 5 (ref 5.0–8.0)
Protein, ur: NEGATIVE mg/dL
RBC / HPF: >50 RBC/hpf — ABNORMAL HIGH (ref 0–5)
SPECIFIC GRAVITY, URINE: 1.019 (ref 1.005–1.030)

## 2018-07-17 LAB — I-STAT CG4 LACTIC ACID, ED
LACTIC ACID, VENOUS: 2.14 mmol/L — AB (ref 0.5–1.9)
Lactic Acid, Venous: 2.48 mmol/L (ref 0.5–1.9)

## 2018-07-17 LAB — INFLUENZA PANEL BY PCR (TYPE A & B)
INFLAPCR: NEGATIVE
Influenza B By PCR: NEGATIVE

## 2018-07-17 LAB — MRSA PCR SCREENING: MRSA by PCR: POSITIVE — AB

## 2018-07-17 LAB — LACTIC ACID, PLASMA: LACTIC ACID, VENOUS: 3.4 mmol/L — AB (ref 0.5–1.9)

## 2018-07-17 MED ORDER — SODIUM CHLORIDE 0.9 % IV BOLUS (SEPSIS)
1000.0000 mL | Freq: Once | INTRAVENOUS | Status: AC
  Administered 2018-07-17: 1000 mL via INTRAVENOUS

## 2018-07-17 MED ORDER — CLOZAPINE 25 MG PO TABS
150.0000 mg | ORAL_TABLET | Freq: Two times a day (BID) | ORAL | Status: DC
  Administered 2018-07-17 – 2018-07-20 (×6): 150 mg via ORAL
  Filled 2018-07-17 (×7): qty 2

## 2018-07-17 MED ORDER — CHLORPROMAZINE HCL 25 MG PO TABS
100.0000 mg | ORAL_TABLET | Freq: Every day | ORAL | Status: DC
  Filled 2018-07-17: qty 4

## 2018-07-17 MED ORDER — ACETAMINOPHEN 325 MG PO TABS
650.0000 mg | ORAL_TABLET | Freq: Four times a day (QID) | ORAL | Status: DC | PRN
  Administered 2018-07-18: 650 mg via ORAL
  Filled 2018-07-17: qty 2

## 2018-07-17 MED ORDER — CLOZAPINE 25 MG PO TABS
50.0000 mg | ORAL_TABLET | Freq: Every day | ORAL | Status: DC
  Filled 2018-07-17: qty 2

## 2018-07-17 MED ORDER — TRAMADOL HCL 50 MG PO TABS: 50.0000 mg | ORAL_TABLET | Freq: Four times a day (QID) | ORAL | Status: DC | PRN

## 2018-07-17 MED ORDER — ONDANSETRON HCL 4 MG PO TABS: 4.0000 mg | ORAL_TABLET | Freq: Four times a day (QID) | ORAL | Status: DC | PRN

## 2018-07-17 MED ORDER — SODIUM CHLORIDE 0.9 % IV SOLN
INTRAVENOUS | Status: DC
  Administered 2018-07-18 – 2018-07-19 (×5): via INTRAVENOUS

## 2018-07-17 MED ORDER — SODIUM CHLORIDE 0.9 % IV SOLN
2.0000 g | INTRAVENOUS | Status: DC
  Administered 2018-07-18 – 2018-07-20 (×3): 2 g via INTRAVENOUS
  Filled 2018-07-17 (×3): qty 2

## 2018-07-17 MED ORDER — VANCOMYCIN HCL 10 G IV SOLR
2000.0000 mg | Freq: Once | INTRAVENOUS | Status: AC
  Administered 2018-07-17: 2000 mg via INTRAVENOUS
  Filled 2018-07-17: qty 2000

## 2018-07-17 MED ORDER — SODIUM CHLORIDE 0.9 % IV SOLN
1000.0000 mL | INTRAVENOUS | Status: DC
  Administered 2018-07-17: 1000 mL via INTRAVENOUS

## 2018-07-17 MED ORDER — MUPIROCIN 2 % EX OINT
1.0000 "application " | TOPICAL_OINTMENT | Freq: Two times a day (BID) | CUTANEOUS | Status: DC
  Administered 2018-07-17 – 2018-07-20 (×6): 1 via NASAL
  Filled 2018-07-17 (×2): qty 22

## 2018-07-17 MED ORDER — ESCITALOPRAM OXALATE 10 MG PO TABS
10.0000 mg | ORAL_TABLET | Freq: Every day | ORAL | Status: DC
  Administered 2018-07-18 – 2018-07-20 (×3): 10 mg via ORAL
  Filled 2018-07-17 (×3): qty 1

## 2018-07-17 MED ORDER — LAMOTRIGINE 25 MG PO TABS
50.0000 mg | ORAL_TABLET | Freq: Three times a day (TID) | ORAL | Status: DC
  Administered 2018-07-17 – 2018-07-20 (×8): 50 mg via ORAL
  Filled 2018-07-17 (×8): qty 2

## 2018-07-17 MED ORDER — POLYETHYLENE GLYCOL 3350 17 G PO PACK
17.0000 g | PACK | Freq: Every day | ORAL | Status: DC
  Administered 2018-07-18 – 2018-07-20 (×3): 17 g via ORAL
  Filled 2018-07-17 (×3): qty 1

## 2018-07-17 MED ORDER — VANCOMYCIN HCL 10 G IV SOLR
1250.0000 mg | INTRAVENOUS | Status: DC
  Administered 2018-07-18: 1250 mg via INTRAVENOUS
  Filled 2018-07-17 (×3): qty 1250

## 2018-07-17 MED ORDER — LEVOTHYROXINE SODIUM 100 MCG PO TABS
100.0000 ug | ORAL_TABLET | Freq: Every day | ORAL | Status: DC
  Administered 2018-07-18 – 2018-07-20 (×3): 100 ug via ORAL
  Filled 2018-07-17 (×3): qty 1

## 2018-07-17 MED ORDER — CHLORPROMAZINE HCL 50 MG PO TABS
150.0000 mg | ORAL_TABLET | Freq: Two times a day (BID) | ORAL | Status: DC
  Filled 2018-07-17: qty 3

## 2018-07-17 MED ORDER — CHLORPROMAZINE HCL 50 MG PO TABS
50.0000 mg | ORAL_TABLET | Freq: Four times a day (QID) | ORAL | Status: DC
  Administered 2018-07-17 – 2018-07-19 (×5): 50 mg via ORAL
  Filled 2018-07-17 (×8): qty 1

## 2018-07-17 MED ORDER — CHLORPROMAZINE HCL 25 MG PO TABS
50.0000 mg | ORAL_TABLET | Freq: Four times a day (QID) | ORAL | Status: DC
  Filled 2018-07-17 (×2): qty 2

## 2018-07-17 MED ORDER — ASPIRIN EC 81 MG PO TBEC
81.0000 mg | DELAYED_RELEASE_TABLET | Freq: Every day | ORAL | Status: DC
  Administered 2018-07-18 – 2018-07-20 (×3): 81 mg via ORAL
  Filled 2018-07-17 (×3): qty 1

## 2018-07-17 MED ORDER — ONDANSETRON HCL 4 MG/2ML IJ SOLN: 4.0000 mg | Freq: Four times a day (QID) | INTRAMUSCULAR | Status: DC | PRN

## 2018-07-17 MED ORDER — ACETAMINOPHEN 650 MG RE SUPP: 650.0000 mg | Freq: Four times a day (QID) | RECTAL | Status: DC | PRN

## 2018-07-17 MED ORDER — ENOXAPARIN SODIUM 40 MG/0.4ML ~~LOC~~ SOLN
40.0000 mg | SUBCUTANEOUS | Status: DC
  Administered 2018-07-17 – 2018-07-19 (×3): 40 mg via SUBCUTANEOUS
  Filled 2018-07-17 (×3): qty 0.4

## 2018-07-17 MED ORDER — SODIUM CHLORIDE 0.9 % IV SOLN
1.0000 g | Freq: Once | INTRAVENOUS | Status: AC
  Administered 2018-07-17: 1 g via INTRAVENOUS
  Filled 2018-07-17: qty 10

## 2018-07-17 MED ORDER — CLOZAPINE 100 MG PO TABS
100.0000 mg | ORAL_TABLET | Freq: Two times a day (BID) | ORAL | Status: DC
  Filled 2018-07-17: qty 1

## 2018-07-17 MED ORDER — CHLORPROMAZINE HCL 50 MG PO TABS
100.0000 mg | ORAL_TABLET | Freq: Every day | ORAL | Status: DC
  Administered 2018-07-17 – 2018-07-19 (×3): 100 mg via ORAL
  Filled 2018-07-17 (×4): qty 2

## 2018-07-17 MED ORDER — ACETAMINOPHEN 325 MG PO TABS
650.0000 mg | ORAL_TABLET | Freq: Once | ORAL | Status: AC
  Administered 2018-07-17: 650 mg via ORAL
  Filled 2018-07-17: qty 2

## 2018-07-17 MED ORDER — ALBUTEROL SULFATE (2.5 MG/3ML) 0.083% IN NEBU: 2.5000 mg | INHALATION_SOLUTION | RESPIRATORY_TRACT | Status: DC | PRN

## 2018-07-17 MED ORDER — SENNA 8.6 MG PO TABS
1.0000 | ORAL_TABLET | Freq: Every day | ORAL | Status: DC
  Administered 2018-07-18 – 2018-07-20 (×3): 8.6 mg via ORAL
  Filled 2018-07-17 (×3): qty 1

## 2018-07-17 MED ORDER — CHLORHEXIDINE GLUCONATE CLOTH 2 % EX PADS
6.0000 | MEDICATED_PAD | Freq: Every day | CUTANEOUS | Status: DC
  Administered 2018-07-18 – 2018-07-20 (×2): 6 via TOPICAL

## 2018-07-17 MED ORDER — GEMFIBROZIL 600 MG PO TABS: 600.0000 mg | ORAL_TABLET | Freq: Two times a day (BID) | ORAL | Status: DC

## 2018-07-17 NOTE — ED Notes (Signed)
ED PA provider Mendel Ryder made aware that patient has critical lactic acid value of 2.14

## 2018-07-17 NOTE — ED Notes (Signed)
Bed: WA04 Expected date:  Expected time:  Means of arrival:  Comments: Septic Patient EMS

## 2018-07-17 NOTE — ED Provider Notes (Addendum)
[facility] DEPT Provider Note   CSN: [id number] Arrival date & time: 07/17/18  1436     History   Chief Complaint Chief Complaint  Patient presents with  . Blood Infection  . Flank Pain    HPI [name] is a 67 y.o. male.  Patient is a 67 year old male with a history of paranoid schizophrenia who lives in a group home, CLL, chronic kidney disease, COPD, hypertension, hyperlipidemia, and severe left ventricular hypertrophy who is presenting today from a group home with general malaise, chills, fever and rapid heart rate.  Patient states he felt okay yesterday but today he is just not been feeling good.  Because of his underlying mental health issues he has difficulty giving a concise history.  The group home states he was normal yesterday and today was not acting himself.  He felt warm and had a oral temperature and was complaining of flank pain at his home.  Patient states his back no longer hurts but it was moving from the left to the right.  The history is provided by the patient and a caregiver.  Flank Pain  This is a new problem. The current episode started 3 to 5 hours ago. The problem occurs constantly. The problem has been resolved. Associated symptoms comments: Nausea, chills, fever, not feeling well and rapid heart rate.  Pt denies SOB, cough, abd pain, diarrhea or chest pain.. Nothing aggravates the symptoms. Nothing relieves the symptoms. He has tried nothing for the symptoms.    Past Medical History:  Diagnosis Date  . Cellulitis   . CHF (congestive heart failure) ([place])   . Chronic kidney disease 09/20/2008  . Chronic lymphocytic leukemia ([place])   . cll dx'd 10/2003   no rx  . CLL (chronic lymphoblastic leukemia) 10/2003  . COPD (chronic obstructive pulmonary disease) ([place])   . GERD (gastroesophageal reflux disease)   . Hiatal hernia   . Hyperlipemia   . Hypertension   . Lower extremity edema   . NHL (non-Hodgkin's lymphoma)  ([place]) dx'd 08/2008 rt groin   chemo comp 08/2008  . Non Hodgkin's lymphoma ([place]) 08/2008  . Non Hodgkin's lymphoma ([place])   . Paranoid schizophrenia ([place])   . Prolonged Q-T interval on ECG   . RBBB 09/17/2017  . Schizoaffective disorder ([place])   . Sepsis ([place])   . Severe left ventricular hypertrophy 10/21/2017  . Vitamin D deficiency     Patient Active Problem List   Diagnosis Date Noted  . Small bowel obstruction ([place]) 02/19/2018  . Cellulitis 12/02/2017  . Severe left ventricular hypertrophy 10/21/2017  . Stage II pressure injury of buttocks 10/03/2017  . Obesity (BMI 30-39.9) 10/03/2017  . Hyperlipidemia 10/03/2017  . Hypothyroidism 10/03/2017  . RBBB 09/17/2017  . Lobar pneumonia ([place]) 02/12/2016  . Cellulitis of right lower extremity 03/28/2015  . Sepsis ([place]) 03/27/2015  . Prolonged QT interval   . CLL (chronic lymphocytic leukemia) ([place]) 09/12/2013  . Non Hodgkin's lymphoma ([place]) 09/12/2013  . Leukocytosis 12/24/2012  . Anemia 12/24/2012  . CKD (chronic kidney disease), stage III ([place]) 12/24/2012  . Schizophrenia ([place]) 12/24/2012    Past Surgical History:  Procedure Laterality Date  . LYMPH NODE BIOPSY          Home Medications    Prior to Admission medications   Medication Sig Start Date End Date Taking? Authorizing Provider  albuterol (PROVENTIL HFA;VENTOLIN HFA) 108 (90 Base) MCG/ACT inhaler Inhale 2 puffs into the lungs every 4 (four) hours  as needed for wheezing or shortness of breath.    [provider]  amoxicillin-clavulanate (AUGMENTIN) 875-125 MG tablet Take 1 tablet by mouth 2 (two) times daily. Patient not taking: Reported on 05/30/2018 02/21/18   Cristy Folks, MD  aspirin EC 81 MG tablet Take 81 mg by mouth daily.    [provider]  chlorproMAZINE (THORAZINE) 100 MG tablet Take 100 mg at bedtime by mouth.     [provider]  chlorproMAZINE (THORAZINE) 50 MG tablet Take 50 mg 4 (four) times daily by mouth.     [provider]  Cholecalciferol (VITAMIN D-3 PO) Take 5,000 mg by mouth daily.    [provider]  cloZAPine (CLOZARIL) 100 MG tablet Take 100 mg by mouth 2 (two) times daily.     [provider]  clozapine (CLOZARIL) 50 MG tablet Take 50 mg by mouth at bedtime.     [provider]  docusate sodium (COLACE) 100 MG capsule Take 100 mg by mouth daily.    [provider]  escitalopram (LEXAPRO) 10 MG tablet Take 10 mg by mouth daily before breakfast.     [provider]  gemfibrozil (LOPID) 600 MG tablet Take 600 mg by mouth 2 (two) times daily before a meal.    [provider]  lamoTRIgine (LAMICTAL) 25 MG tablet Take 50 mg by mouth 3 (three) times daily.     [provider]  levothyroxine (SYNTHROID, LEVOTHROID) 100 MCG tablet Take 100 mcg daily by mouth.     [provider]  Nystatin POWD Apply 1 application 3 (three) times daily as needed topically ([place]ash).    [provider]  Omega-3 Fatty Acids (FISH OIL) 1000 MG CAPS Take 4,000 mg by mouth daily.    [provider]  polyethylene glycol (MIRALAX / GLYCOLAX) packet Take 17 g daily by mouth. Dissolve 17g in 8oz of water/juice and drink by mouth every day    [provider]  senna (SENOKOT) 8.6 MG TABS tablet Take 1 tablet by mouth daily.    [provider]    Family History Family History  Problem Relation Age of Onset  . Alcohol abuse Father     Social History Social History   Tobacco Use  . Smoking status: Never Smoker  . Smokeless tobacco: Never Used  Substance Use Topics  . Alcohol use: No  . Drug use: No     Allergies   Sulfamethoxazole   Review of Systems Review of Systems  Genitourinary: Positive for flank pain.  All other systems reviewed and are negative.    Physical Exam Updated Vital Signs BP 139/77 (BP Location: Left Arm)   Pulse (!) 121   Temp (!) 103.1 F (39.5 C) (Rectal)   Resp 19   SpO2 96%    Physical Exam  Constitutional: He is oriented to person, place, and time. He appears well-developed and well-nourished. No distress.  HENT:  Head: Normocephalic and atraumatic.  Mouth/Throat: Oropharynx is clear and moist. Mucous membranes are dry.  Eyes: Pupils are equal, round, and reactive to light. Conjunctivae and EOM are normal.  Neck: Normal range of motion. Neck supple.  Cardiovascular: Regular rhythm and intact distal pulses. Tachycardia present.  No murmur heard. Pulmonary/Chest: Effort normal and breath sounds normal. No respiratory distress. He has no wheezes. He has no rales.  Abdominal: Soft. He exhibits no distension. There is tenderness in the suprapubic area. There is no rebound, no guarding and no CVA tenderness.  Musculoskeletal: Normal range of motion. He exhibits no edema or tenderness.  Neurological: He is alert and oriented to person, place, and time. He has normal strength. No cranial nerve deficit or sensory deficit.  Skin: Skin is warm and dry. Rash noted. No erythema.     Psychiatric: He has a normal mood and affect. His behavior is normal.  Awake alert and cooperative.  Some tangential speech  Nursing note and vitals reviewed.    ED Treatments / Results  Labs (all labs ordered are listed, but only abnormal results are displayed) Labs Reviewed  COMPREHENSIVE METABOLIC PANEL - Abnormal; Notable for the following components:      Result Value   Glucose, Bld 141 (*)    Creatinine, Ser 1.95 (*)    Total Protein 6.3 (*)    GFR calc non Af Amer 34 (*)    GFR calc Af Amer 40 (*)    All other components within normal limits  CBC WITH DIFFERENTIAL/PLATELET - Abnormal; Notable for the following components:   WBC 17.4 (*)    Neutro Abs 16.5 (*)    Lymphs Abs 0.6 (*)    All other components within normal limits  URINALYSIS, ROUTINE W REFLEX MICROSCOPIC - Abnormal; Notable for the following components:   Hgb urine dipstick MODERATE (*)    RBC / HPF >50 (*)     Bacteria, UA RARE (*)    All other components within normal limits  I-STAT CG4 LACTIC ACID, ED - Abnormal; Notable for the following components:   Lactic Acid, Venous 2.48 (*)    All other components within normal limits  CULTURE, BLOOD (ROUTINE X 2)  CULTURE, BLOOD (ROUTINE X 2)  URINE CULTURE  PROTIME-INR  I-STAT CG4 LACTIC ACID, ED    EKG EKG Interpretation  Date/Time:  Sunday July 17 2018 15:23:30 EDT Ventricular Rate:  119 PR Interval:    QRS Duration: 159 QT Interval:  385 QTC Calculation: 542 R Axis:   -61 Text Interpretation:  Sinus or ectopic atrial tachycardia RBBB and LAFB No significant change since last tracing Confirmed by Blanchie Dessert [phone]) on 07/17/2018 3:55:03 PM   Radiology Dg Chest 2 View  Result Date: 07/17/2018 CLINICAL DATA:  Onset shaking and chills today. EXAM: CHEST - 2 VIEW COMPARISON:  PA and lateral chest 12/02/2017 and 10/12/2017. FINDINGS: Lung volumes are low with mild left basilar atelectasis. No consolidative process, pneumothorax or effusion. Heart size is normal. No acute or focal bony abnormality. IMPRESSION: No acute finding in a low volume chest. Electronically Signed   By: Inge Rise M.D.   On: 07/17/2018 15:24   Ct Renal Stone Study  Result Date: 07/17/2018 CLINICAL DATA:  67 year old male with history of chills and shaking. Tachycardia. Febrile. EXAM: CT ABDOMEN AND PELVIS WITHOUT CONTRAST TECHNIQUE: Multidetector CT imaging of the abdomen and pelvis was performed following the standard protocol without IV contrast. COMPARISON:  CT the abdomen and pelvis 02/19/2018. FINDINGS: Lower chest: Trace bilateral pleural effusions lying dependently. Linear scarring in the left lower lobe, similar to the prior study. Hepatobiliary: No suspicious cystic or solid hepatic lesions are confidently identified on today's noncontrast CT examination. Unenhanced appearance of the gallbladder is normal. Pancreas: No definite pancreatic mass or  peripancreatic fluid or inflammatory changes are noted on today's noncontrast CT examination. Spleen: Unremarkable. Adrenals/Urinary Tract: Unenhanced appearance of the kidneys and bilateral adrenal glands is normal. No hydroureteronephrosis. Urinary bladder is nearly decompressed, but the urinary bladder wall appears potentially thickened. Stomach/Bowel: Unenhanced appearance of  the stomach is normal. No pathologic dilatation of small bowel or colon. Normal appendix. Vascular/Lymphatic: Aortic atherosclerosis. No lymphadenopathy noted in the abdomen or pelvis. Reproductive: Prostate gland and seminal vesicles are unremarkable in appearance. Other: No significant volume of ascites.  No pneumoperitoneum. Musculoskeletal: There are no aggressive appearing lytic or blastic lesions noted in the visualized portions of the skeleton. IMPRESSION: 1. No definite acute findings are noted in the abdomen or pelvis to account for the patient's symptoms. 2. However, there is potential bladder wall thickening. Clinical correlation for signs and symptoms of cystitis is suggested. 3. In addition, there are trace bilateral pleural effusions lying dependently. 4. Aortic atherosclerosis. 5. Normal appendix. Electronically Signed   By: Vinnie Langton M.D.   On: 07/17/2018 17:16    Procedures Procedures (including critical care time)  Medications Ordered in ED Medications  0.9 %  sodium chloride infusion (has no administration in time range)  sodium chloride 0.9 % bolus 1,000 mL (has no administration in time range)    And  sodium chloride 0.9 % bolus 1,000 mL (has no administration in time range)  acetaminophen (TYLENOL) tablet 650 mg (has no administration in time range)     Initial Impression / Assessment and Plan / ED Course  I have reviewed the triage vital signs and the nursing notes.  Pertinent labs & imaging results that were available during my care of the patient were reviewed by me and considered in my  medical decision making (see chart for details).     Patient is a 67 year old male presenting today with symptoms concerning for sepsis.  He is febrile to 103.1, tachycardic to 120 and earlier today per group home was complaining of flank pain.  Patient has some mild suprapubic pain here but has no reproducible flank pain.  He is awake and alert.  He is a difficult historian but is cooperative.  Since blood pressure is within normal limits and no signs of severe sepsis at this time.  Lactate is mildly elevated at 2.48.  He denies any cough or URI symptoms.  He has no abdominal findings concerning for perforation, diverticulitis or appendicitis.  Sepsis order set initiated.  Patient given Tylenol for his fever.  Started with 2 L of fluid because no evidence of severe sepsis.  Will monitor closely and if he becomes hypotensive we will give 30/kg which would be a total of 4 L.  5:23 PM Patient's labs show a leukocytosis of 17,000, CMP with chronic kidney disease but creatinine appears to be baseline.  Chest x-ray within normal limits.  Patient initially covered with Rocephin for concern for pyelonephritis or UTI.  CT renal study was performed as patient was having flank pain.  CT did not show any stranding around the kidney or retained stone.  He did have some bladder wall thickening but UA was not classic for infection.  At this time patient is a fever of unknown origin.  Blood cultures are pending.  Will discuss with hospitalist about broadening coverage.  Will admit for further care.  5:32 PM Reevaluation looks again at the patient's right lower extremity and now it appears cellulitic.  It is now hot, red from the ankle to the knee.  This is most likely his source.  Will cover with vancomycin.  CRITICAL CARE Performed by: Flordia Kassem Total critical care time: 30 minutes Critical care time was exclusive of separately billable procedures and treating other patients. Critical care was necessary to  treat or prevent imminent or  life-threatening deterioration. Critical care was time spent personally by me on the following activities: development of treatment plan with patient and/or surrogate as well as nursing, discussions with consultants, evaluation of patient's response to treatment, examination of patient, obtaining history from patient or surrogate, ordering and performing treatments and interventions, ordering and review of laboratory studies, ordering and review of radiographic studies, pulse oximetry and re-evaluation of patient's condition.   Final Clinical Impressions(s) / ED Diagnoses   Final diagnoses:  Cellulitis of right lower extremity  Sepsis, due to unspecified organism [facility])    ED Discharge Orders    None       Blanchie Dessert, MD 07/17/18 1733    Blanchie Dessert, MD 07/27/18 1626

## 2018-07-17 NOTE — Progress Notes (Signed)
A consult was received from an ED physician for vancomycin per pharmacy dosing.  The patient's profile has been reviewed for ht/wt/allergies/indication/available labs.   A one time order has been placed for vancomycin 2g IV x 1.  Further antibiotics/pharmacy consults should be ordered by admitting physician if indicated.                       Thank you, Romeo Rabon, PharmD. Mobile: [phone]. 07/17/2018,5:45 PM.

## 2018-07-17 NOTE — ED Notes (Signed)
ED provider Zenia Resides made aware patient has critical lactic acid value of 2.48

## 2018-07-17 NOTE — ED Triage Notes (Signed)
Per EMS: Pt is coming from a group home with c/o chills and shaking. Pt has a hx of cancer. Pt reports he was "Not feeling well" Pt is tachy and febrile

## 2018-07-17 NOTE — ED Notes (Signed)
ED TO INPATIENT HANDOFF REPORT  Name/Age/Gender [name] 67 y.o. male  Code Status    Code Status Orders  (From admission, onward)         Start     Ordered   07/17/18 1753  Full code  Continuous     07/17/18 1757        Code Status History    Date Active Date Inactive Code Status Order ID Comments User Context   02/19/2018 1025 02/22/2018 0001 Full Code [id number]  Janece Canterbury, MD ED   12/02/2017 2308 12/06/2017 2008 Full Code [id number]  Damita Lack, MD ED   10/02/2017 1009 10/04/2017 1830 Full Code [id number]  Rondel Jumbo, PA-C ED   06/03/2016 1604 06/06/2016 1900 Full Code [id number]  Collene Gobble, MD Inpatient   02/13/2016 0128 02/17/2016 1959 Full Code [id number]  Phillips Grout, MD Inpatient   05/15/2015 2311 05/17/2015 1839 Full Code [id number]  Evelina Bucy, MD ED   04/06/2015 1243 04/11/2015 1357 Full Code [id number]  Oswald Hillock, MD Inpatient   03/27/2015 2138 03/31/2015 1437 Full Code [id number]  Toy Baker, MD Inpatient   12/24/2012 1555 12/26/2012 1812 Full Code [id number]  Robbie Lis, MD Inpatient      Home/SNF/Other Boarding Home  Chief Complaint sepsis  Level of Care/Admitting Diagnosis ED Disposition    ED Disposition Condition [facility]: [facility] [[id number]]  Level of Care: Telemetry [5]  Admit to tele based on following criteria: Other see comments  Comments: sepsis  Diagnosis: Sepsis [facility]) [[id number]]  Admitting Physician: Aline August [[id number]]  Attending Physician: Aline August [[id number]]  Estimated length of stay: 3 - 4 days  Certification:: I certify this patient will need inpatient services for at least 2 midnights  PT Class (Do Not Modify): Inpatient [101]  PT Acc Code (Do Not Modify): Private [1]       Medical History Past Medical History:  Diagnosis Date  . Cellulitis   . CHF (congestive heart failure) ([place])   . Chronic kidney disease 09/20/2008  . Chronic lymphocytic  leukemia ([place])   . cll dx'd 10/2003   no rx  . CLL (chronic lymphoblastic leukemia) 10/2003  . COPD (chronic obstructive pulmonary disease) ([place])   . GERD (gastroesophageal reflux disease)   . Hiatal hernia   . Hyperlipemia   . Hypertension   . Lower extremity edema   . NHL (non-Hodgkin's lymphoma) ([place]) dx'd 08/2008 rt groin   chemo comp 08/2008  . Non Hodgkin's lymphoma ([place]) 08/2008  . Non Hodgkin's lymphoma ([place])   . Paranoid schizophrenia ([place])   . Prolonged Q-T interval on ECG   . RBBB 09/17/2017  . Schizoaffective disorder ([place])   . Sepsis ([place])   . Severe left ventricular hypertrophy 10/21/2017  . Vitamin D deficiency     Allergies Allergies  Allergen Reactions  . Sulfamethoxazole Rash    IV Location/Drains/Wounds Patient Lines/Drains/Airways Status   Active Line/Drains/Airways    Name:   Placement date:   Placement time:   Site:   Days:   Peripheral IV 02/19/18 Left Hand   02/19/18    0305    Hand   148   Peripheral IV 07/17/18 Right Antecubital   07/17/18    1522    Antecubital   less than 1   Pressure Injury 10/02/17 Stage II -  Partial thickness loss of dermis presenting as a shallow open ulcer with a red, pink wound bed  without slough.   10/02/17    1636     288   Pressure Injury 12/03/17 Stage II -  Partial thickness loss of dermis presenting as a shallow open ulcer with a red, pink wound bed without slough.   12/03/17    1430     226          Labs/Imaging Results for orders placed or performed during the hospital encounter of 07/17/18 (from the past 48 hour(s))  Comprehensive metabolic panel     Status: Abnormal   Collection Time: 07/17/18  2:54 PM  Result Value Ref Range   Sodium 142 135 - 145 mmol/L   Potassium 4.3 3.5 - 5.1 mmol/L   Chloride 107 98 - 111 mmol/L   CO2 25 22 - 32 mmol/L   Glucose, Bld 141 (H) 70 - 99 mg/dL   BUN 21 8 - 23 mg/dL   Creatinine, Ser 1.95 (H) 0.61 - 1.24 mg/dL   Calcium 9.2 8.9 - 10.3 mg/dL   Total Protein 6.3 (L) 6.5 -  8.1 g/dL   Albumin 4.1 3.5 - 5.0 g/dL   AST 25 15 - 41 U/L   ALT 18 0 - 44 U/L   Alkaline Phosphatase 92 38 - 126 U/L   Total Bilirubin 0.4 0.3 - 1.2 mg/dL   GFR calc non Af Amer 34 (L) >60 mL/min   GFR calc Af Amer 40 (L) >60 mL/min    Comment: (NOTE) The eGFR has been calculated using the CKD EPI equation. This calculation has not been validated in all clinical situations. eGFR's persistently <60 mL/min signify possible Chronic Kidney Disease.    Anion gap 10 5 - 15    Comment: Performed at [facility], [place] [address] [place], [place] [address]  CBC with Differential     Status: Abnormal   Collection Time: 07/17/18  2:54 PM  Result Value Ref Range   WBC 17.4 (H) 4.0 - 10.5 K/uL   RBC 4.51 4.22 - 5.81 MIL/uL   Hemoglobin 13.9 13.0 - 17.0 g/dL   HCT 42.1 39.0 - 52.0 %   MCV 93.3 78.0 - 100.0 fL   MCH 30.8 26.0 - 34.0 pg   MCHC 33.0 30.0 - 36.0 g/dL   RDW 14.1 11.5 - 15.5 %   Platelets 160 150 - 400 K/uL   Neutrophils Relative % 96 %   Neutro Abs 16.5 (H) 1.7 - 7.7 K/uL   Lymphocytes Relative 3 %   Lymphs Abs 0.6 (L) 0.7 - 4.0 K/uL   Monocytes Relative 1 %   Monocytes Absolute 0.3 0.1 - 1.0 K/uL   Eosinophils Relative 0 %   Eosinophils Absolute 0.0 0.0 - 0.7 K/uL   Basophils Relative 0 %   Basophils Absolute 0.0 0.0 - 0.1 K/uL    Comment: Performed at [facility], [place] [address], [place], [place] [address]  Protime-INR     Status: None   Collection Time: 07/17/18  2:54 PM  Result Value Ref Range   Prothrombin Time 12.9 11.4 - 15.2 seconds   INR 0.98     Comment: Performed at [facility], [place] [address], [place], [place] [address]  I-Stat CG4 Lactic Acid, ED     Status: Abnormal   Collection Time: 07/17/18  3:03 PM  Result Value Ref Range   Lactic Acid, Venous 2.48 (HH) 0.5 - 1.9 mmol/L   Comment NOTIFIED PHYSICIAN   Urinalysis, Routine w reflex microscopic  Status: Abnormal   Collection Time: 07/17/18   4:26 PM  Result Value Ref Range   Color, Urine YELLOW YELLOW   APPearance CLEAR CLEAR   Specific Gravity, Urine 1.019 1.005 - 1.030   pH 5.0 5.0 - 8.0   Glucose, UA NEGATIVE NEGATIVE mg/dL   Hgb urine dipstick MODERATE (A) NEGATIVE   Bilirubin Urine NEGATIVE NEGATIVE   Ketones, ur NEGATIVE NEGATIVE mg/dL   Protein, ur NEGATIVE NEGATIVE mg/dL   Nitrite NEGATIVE NEGATIVE   Leukocytes, UA NEGATIVE NEGATIVE   RBC / HPF >50 (H) 0 - 5 RBC/hpf   WBC, UA 0-5 0 - 5 WBC/hpf   Bacteria, UA RARE (A) NONE SEEN   Mucus PRESENT     Comment: Performed at [facility], [place] [address], [place], [place] [address]  I-Stat CG4 Lactic Acid, ED     Status: Abnormal   Collection Time: 07/17/18  5:38 PM  Result Value Ref Range   Lactic Acid, Venous 2.14 (HH) 0.5 - 1.9 mmol/L   Comment NOTIFIED PHYSICIAN    Dg Chest 2 View  Result Date: 07/17/2018 CLINICAL DATA:  Onset shaking and chills today. EXAM: CHEST - 2 VIEW COMPARISON:  PA and lateral chest 12/02/2017 and 10/12/2017. FINDINGS: Lung volumes are low with mild left basilar atelectasis. No consolidative process, pneumothorax or effusion. Heart size is normal. No acute or focal bony abnormality. IMPRESSION: No acute finding in a low volume chest. Electronically Signed   By: Inge Rise M.D.   On: 07/17/2018 15:24   Ct Renal Stone Study  Result Date: 07/17/2018 CLINICAL DATA:  67 year old male with history of chills and shaking. Tachycardia. Febrile. EXAM: CT ABDOMEN AND PELVIS WITHOUT CONTRAST TECHNIQUE: Multidetector CT imaging of the abdomen and pelvis was performed following the standard protocol without IV contrast. COMPARISON:  CT the abdomen and pelvis 02/19/2018. FINDINGS: Lower chest: Trace bilateral pleural effusions lying dependently. Linear scarring in the left lower lobe, similar to the prior study. Hepatobiliary: No suspicious cystic or solid hepatic lesions are confidently identified on today's noncontrast CT examination.  Unenhanced appearance of the gallbladder is normal. Pancreas: No definite pancreatic mass or peripancreatic fluid or inflammatory changes are noted on today's noncontrast CT examination. Spleen: Unremarkable. Adrenals/Urinary Tract: Unenhanced appearance of the kidneys and bilateral adrenal glands is normal. No hydroureteronephrosis. Urinary bladder is nearly decompressed, but the urinary bladder wall appears potentially thickened. Stomach/Bowel: Unenhanced appearance of the stomach is normal. No pathologic dilatation of small bowel or colon. Normal appendix. Vascular/Lymphatic: Aortic atherosclerosis. No lymphadenopathy noted in the abdomen or pelvis. Reproductive: Prostate gland and seminal vesicles are unremarkable in appearance. Other: No significant volume of ascites.  No pneumoperitoneum. Musculoskeletal: There are no aggressive appearing lytic or blastic lesions noted in the visualized portions of the skeleton. IMPRESSION: 1. No definite acute findings are noted in the abdomen or pelvis to account for the patient's symptoms. 2. However, there is potential bladder wall thickening. Clinical correlation for signs and symptoms of cystitis is suggested. 3. In addition, there are trace bilateral pleural effusions lying dependently. 4. Aortic atherosclerosis. 5. Normal appendix. Electronically Signed   By: Vinnie Langton M.D.   On: 07/17/2018 17:16    Pending Labs Unresulted Labs (From admission, onward)    Start     Ordered   07/24/18 0500  Creatinine, serum  (enoxaparin (LOVENOX)    CrCl >/= 30 ml/min)  Weekly,   R    Comments:  while on enoxaparin therapy    07/17/18 1757  07/18/18 0500  Comprehensive metabolic panel  Tomorrow morning,   R     07/17/18 1757   07/18/18 0500  CBC  Tomorrow morning,   R     07/17/18 1757   07/17/18 1758  Influenza panel by PCR (type A & B)  (Influenza PCR Panel)  Once,   R     07/17/18 1757   07/17/18 1752  Lactic acid, plasma  STAT Now then every 3 hours,   STAT      07/17/18 1757   07/17/18 1752  Procalcitonin  STAT,   R     07/17/18 1757   07/17/18 1752  CBC  (enoxaparin (LOVENOX)    CrCl >/= 30 ml/min)  Once,   R    Comments:  Baseline for enoxaparin therapy IF NOT ALREADY DRAWN.  Notify MD if PLT < 100 K.    07/17/18 1757   07/17/18 1752  Creatinine, serum  (enoxaparin (LOVENOX)    CrCl >/= 30 ml/min)  Once,   R    Comments:  Baseline for enoxaparin therapy IF NOT ALREADY DRAWN.    07/17/18 1757   07/17/18 1511  Urine culture  STAT,   STAT     07/17/18 1512   07/17/18 1450  Culture, blood (Routine x 2)  BLOOD CULTURE X 2,   STAT     07/17/18 1449          Vitals/Pain Today's Vitals   07/17/18 1605 07/17/18 1606 07/17/18 1617 07/17/18 1800  BP:  (!) 99/57 110/65 113/63  Pulse:  (!) 118 (!) 112 (!) 110  Resp:  '14 18 16  ' Temp:      TempSrc:      SpO2:  98% 98% 95%  Weight: 124.7 kg     Height: 6' (1.829 m)       Isolation Precautions Droplet precaution  Medications Medications  0.9 %  sodium chloride infusion (1,000 mLs Intravenous New Bag/Given 07/17/18 1555)  vancomycin (VANCOCIN) 2,000 mg in sodium chloride 0.9 % 500 mL IVPB (2,000 mg Intravenous New Bag/Given 07/17/18 1802)  aspirin EC tablet 81 mg (has no administration in time range)  gemfibrozil (LOPID) tablet 600 mg (has no administration in time range)  chlorproMAZINE (THORAZINE) tablet 100 mg (has no administration in time range)  chlorproMAZINE (THORAZINE) tablet 50 mg (has no administration in time range)  cloZAPine (CLOZARIL) tablet 100 mg (has no administration in time range)  cloZAPine (CLOZARIL) tablet 50 mg (has no administration in time range)  albuterol (PROVENTIL HFA;VENTOLIN HFA) 108 (90 Base) MCG/ACT inhaler 2 puff (has no administration in time range)  lamoTRIgine (LAMICTAL) tablet 50 mg (has no administration in time range)  senna (SENOKOT) tablet 8.6 mg (has no administration in time range)  polyethylene glycol (MIRALAX / GLYCOLAX) packet 17 g (has no  administration in time range)  levothyroxine (SYNTHROID, LEVOTHROID) tablet 100 mcg (has no administration in time range)  escitalopram (LEXAPRO) tablet 10 mg (has no administration in time range)  enoxaparin (LOVENOX) injection 40 mg (has no administration in time range)  0.9 %  sodium chloride infusion (has no administration in time range)  acetaminophen (TYLENOL) tablet 650 mg (has no administration in time range)    Or  acetaminophen (TYLENOL) suppository 650 mg (has no administration in time range)  traMADol (ULTRAM) tablet 50 mg (has no administration in time range)  ondansetron (ZOFRAN) tablet 4 mg (has no administration in time range)    Or  ondansetron (ZOFRAN) injection 4 mg (has no  administration in time range)  cefTRIAXone (ROCEPHIN) 2 g in sodium chloride 0.9 % 100 mL IVPB (has no administration in time range)  sodium chloride 0.9 % bolus 1,000 mL (1,000 mLs Intravenous New Bag/Given 07/17/18 1602)    And  sodium chloride 0.9 % bolus 1,000 mL (1,000 mLs Intravenous New Bag/Given 07/17/18 1553)  acetaminophen (TYLENOL) tablet 650 mg (650 mg Oral Given 07/17/18 1556)  cefTRIAXone (ROCEPHIN) 1 g in sodium chloride 0.9 % 100 mL IVPB (1 g Intravenous New Bag/Given 07/17/18 1632)    Mobility walks with person assist

## 2018-07-17 NOTE — H&P (Signed)
History and Physical    MANISH RUGGIERO TWS:[id number] DOB: 07-31-1951 DOA: 07/17/2018  PCP: [name], FNP   Patient coming from: Group home  I have personally briefly reviewed patient's old medical records in [place]  Chief Complaint: Fever and flank pain  HPI: [name] is a 67 y.o. male with medical history significant of schizoaffective disorder who lives in a group home, non-Hodgkin's lymphoma, chronic kidney disease stage III, COPD, hypertension, hyperlipidemia presented from the group home with fever and chills that started today.  Patient is a poor historian because of his schizoaffective disorder but states that he felt okay yesterday but today he has not been feeling good.  He had fever with chills today along with flank pain.  He had some nausea but denied any vomiting.  Patient denies any cough, shortness of breath, abdominal pain, diarrhea, dysuria.  He denies any hematuria.  ED Course: Patient was found to have fever with tachycardia along with leukocytosis and elevated lactic acid.  He was started on intravenous fluids and antibiotics.  CT of the abdomen pelvis did not show any acute finding except for potential bladder wall thickening.  Patient was found to have right lower extremity cellulitis.  Hospitalist service was called to evaluate the patient  Review of Systems: Could not be obtained appropriately because of patient being a poor historian.  Past Medical History:  Diagnosis Date  . Cellulitis   . CHF (congestive heart failure) ([place])   . Chronic kidney disease 09/20/2008  . Chronic lymphocytic leukemia ([place])   . cll dx'd 10/2003   no rx  . CLL (chronic lymphoblastic leukemia) 10/2003  . COPD (chronic obstructive pulmonary disease) ([place])   . GERD (gastroesophageal reflux disease)   . Hiatal hernia   . Hyperlipemia   . Hypertension   . Lower extremity edema   . NHL (non-Hodgkin's lymphoma) ([place]) dx'd 08/2008 rt groin   chemo comp 08/2008    . Non Hodgkin's lymphoma ([place]) 08/2008  . Non Hodgkin's lymphoma ([place])   . Paranoid schizophrenia ([place])   . Prolonged Q-T interval on ECG   . RBBB 09/17/2017  . Schizoaffective disorder ([place])   . Sepsis ([place])   . Severe left ventricular hypertrophy 10/21/2017  . Vitamin D deficiency     Past Surgical History:  Procedure Laterality Date  . LYMPH NODE BIOPSY     Social history  reports that he has never smoked. He has never used smokeless tobacco. He reports that he does not drink alcohol or use drugs.  Currently lives in a group home  Allergies  Allergen Reactions  . Sulfamethoxazole Rash    Family History  Problem Relation Age of Onset  . Alcohol abuse Father     Prior to Admission medications   Medication Sig Start Date End Date Taking? Authorizing Provider  albuterol (PROVENTIL HFA;VENTOLIN HFA) 108 (90 Base) MCG/ACT inhaler Inhale 2 puffs into the lungs every 4 (four) hours as needed for wheezing or shortness of breath.    [provider]  amoxicillin-clavulanate (AUGMENTIN) 875-125 MG tablet Take 1 tablet by mouth 2 (two) times daily. Patient not taking: Reported on 05/30/2018 02/21/18   Cristy Folks, MD  aspirin EC 81 MG tablet Take 81 mg by mouth daily.    [provider]  chlorproMAZINE (THORAZINE) 100 MG tablet Take 100 mg at bedtime by mouth.     [provider]  chlorproMAZINE (THORAZINE) 50 MG tablet Take 50 mg 4 (four) times daily  by mouth.     [provider]  Cholecalciferol (VITAMIN D-3 PO) Take 5,000 mg by mouth daily.    [provider]  cloZAPine (CLOZARIL) 100 MG tablet Take 100 mg by mouth 2 (two) times daily.     [provider]  clozapine (CLOZARIL) 50 MG tablet Take 50 mg by mouth at bedtime.     [provider]  docusate sodium (COLACE) 100 MG capsule Take 100 mg by mouth daily.    [provider]  escitalopram (LEXAPRO) 10 MG tablet Take 10 mg by mouth daily before breakfast.      [provider]  gemfibrozil (LOPID) 600 MG tablet Take 600 mg by mouth 2 (two) times daily before a meal.    [provider]  lamoTRIgine (LAMICTAL) 25 MG tablet Take 50 mg by mouth 3 (three) times daily.     [provider]  levothyroxine (SYNTHROID, LEVOTHROID) 100 MCG tablet Take 100 mcg daily by mouth.     [provider]  Nystatin POWD Apply 1 application 3 (three) times daily as needed topically (Rash).    [provider]  Omega-3 Fatty Acids (FISH OIL) 1000 MG CAPS Take 4,000 mg by mouth daily.    [provider]  polyethylene glycol (MIRALAX / GLYCOLAX) packet Take 17 g daily by mouth. Dissolve 17g in 8oz of water/juice and drink by mouth every day    [provider]  senna (SENOKOT) 8.6 MG TABS tablet Take 1 tablet by mouth daily.    [provider]    Physical Exam: Vitals:   07/17/18 1447 07/17/18 1605 07/17/18 1606 07/17/18 1617  BP: 139/77  (!) 99/57 110/65  Pulse: (!) 121  (!) 118 (!) 112  Resp: 19  14 18   Temp: (!) 103.1 F (39.5 C)     TempSrc: Rectal     SpO2: 96%  98% 98%  Weight:  124.7 kg    Height:  6' (1.829 m)      Constitutional: NAD, calm, comfortable.  Looks older than stated age 23:   07/17/18 1447 07/17/18 1605 07/17/18 1606 07/17/18 1617  BP: 139/77  (!) 99/57 110/65  Pulse: (!) 121  (!) 118 (!) 112  Resp: 19  14 18   Temp: (!) 103.1 F (39.5 C)     TempSrc: Rectal     SpO2: 96%  98% 98%  Weight:  124.7 kg    Height:  6' (1.829 m)     Eyes: PERRL, lids and conjunctivae normal ENMT: Mucous membranes are dry. Posterior pharynx clear of any exudate or lesions. Neck: normal, supple, no masses, no thyromegaly Respiratory: bilateral decreased breath sounds at bases, no wheezing, no crackles. Normal respiratory effort. No accessory muscle use.  Cardiovascular: S1 S2 positive, tachycardic.  Mild right lower extremity edema. 2+ pedal pulses.  Abdomen: no tenderness, no masses  palpated. No hepatosplenomegaly. Bowel sounds positive.  Mild suprapubic tenderness Genitourinary: No CVA tenderness Musculoskeletal: no clubbing / cyanosis. No joint deformity upper and lower extremities.  Skin: Right lower extremity is erythematous with mild swelling and tenderness from knee down to the ankle with no ulcer or drainage, lesions, ulcers. No induration Neurologic: CN 2-12 grossly intact. Moving extremities. No focal neurologic deficits.  Psychiatric: Difficult to assess because of patient being a poor historian  Labs on Admission: I have personally reviewed following labs and imaging studies  CBC: Recent Labs  Lab 07/17/18 1454  WBC 17.4*  NEUTROABS 16.5*  HGB 13.9  HCT  42.1  MCV 93.3  PLT 709   Basic Metabolic Panel: Recent Labs  Lab 07/17/18 1454  NA 142  K 4.3  CL 107  CO2 25  GLUCOSE 141*  BUN 21  CREATININE 1.95*  CALCIUM 9.2   GFR: Estimated Creatinine Clearance: 51.5 mL/min (A) (by C-G formula based on SCr of 1.95 mg/dL (H)). Liver Function Tests: Recent Labs  Lab 07/17/18 1454  AST 25  ALT 18  ALKPHOS 92  BILITOT 0.4  PROT 6.3*  ALBUMIN 4.1   No results for input(s): LIPASE, AMYLASE in the last 168 hours. No results for input(s): AMMONIA in the last 168 hours. Coagulation Profile: Recent Labs  Lab 07/17/18 1454  INR 0.98   Cardiac Enzymes: No results for input(s): CKTOTAL, CKMB, CKMBINDEX, TROPONINI in the last 168 hours. BNP (last 3 results) Recent Labs    09/14/17 1527  PROBNP 57   HbA1C: No results for input(s): HGBA1C in the last 72 hours. CBG: No results for input(s): GLUCAP in the last 168 hours. Lipid Profile: No results for input(s): CHOL, HDL, LDLCALC, TRIG, CHOLHDL, LDLDIRECT in the last 72 hours. Thyroid Function Tests: No results for input(s): TSH, T4TOTAL, FREET4, T3FREE, THYROIDAB in the last 72 hours. Anemia Panel: No results for input(s): VITAMINB12, FOLATE, FERRITIN, TIBC, IRON, RETICCTPCT in the last 72  hours. Urine analysis:    Component Value Date/Time   COLORURINE YELLOW 07/17/2018 1626   APPEARANCEUR CLEAR 07/17/2018 1626   LABSPEC 1.019 07/17/2018 1626   PHURINE 5.0 07/17/2018 1626   GLUCOSEU NEGATIVE 07/17/2018 1626   HGBUR MODERATE (A) 07/17/2018 1626   BILIRUBINUR NEGATIVE 07/17/2018 1626   KETONESUR NEGATIVE 07/17/2018 1626   PROTEINUR NEGATIVE 07/17/2018 1626   UROBILINOGEN 0.2 05/09/2015 1740   NITRITE NEGATIVE 07/17/2018 1626   LEUKOCYTESUR NEGATIVE 07/17/2018 1626    Radiological Exams on Admission: Dg Chest 2 View  Result Date: 07/17/2018 CLINICAL DATA:  Onset shaking and chills today. EXAM: CHEST - 2 VIEW COMPARISON:  PA and lateral chest 12/02/2017 and 10/12/2017. FINDINGS: Lung volumes are low with mild left basilar atelectasis. No consolidative process, pneumothorax or effusion. Heart size is normal. No acute or focal bony abnormality. IMPRESSION: No acute finding in a low volume chest. Electronically Signed   By: Inge Rise M.D.   On: 07/17/2018 15:24   Ct Renal Stone Study  Result Date: 07/17/2018 CLINICAL DATA:  67 year old male with history of chills and shaking. Tachycardia. Febrile. EXAM: CT ABDOMEN AND PELVIS WITHOUT CONTRAST TECHNIQUE: Multidetector CT imaging of the abdomen and pelvis was performed following the standard protocol without IV contrast. COMPARISON:  CT the abdomen and pelvis 02/19/2018. FINDINGS: Lower chest: Trace bilateral pleural effusions lying dependently. Linear scarring in the left lower lobe, similar to the prior study. Hepatobiliary: No suspicious cystic or solid hepatic lesions are confidently identified on today's noncontrast CT examination. Unenhanced appearance of the gallbladder is normal. Pancreas: No definite pancreatic mass or peripancreatic fluid or inflammatory changes are noted on today's noncontrast CT examination. Spleen: Unremarkable. Adrenals/Urinary Tract: Unenhanced appearance of the kidneys and bilateral adrenal glands  is normal. No hydroureteronephrosis. Urinary bladder is nearly decompressed, but the urinary bladder wall appears potentially thickened. Stomach/Bowel: Unenhanced appearance of the stomach is normal. No pathologic dilatation of small bowel or colon. Normal appendix. Vascular/Lymphatic: Aortic atherosclerosis. No lymphadenopathy noted in the abdomen or pelvis. Reproductive: Prostate gland and seminal vesicles are unremarkable in appearance. Other: No significant volume of ascites.  No pneumoperitoneum. Musculoskeletal: There are no aggressive appearing lytic  or blastic lesions noted in the visualized portions of the skeleton. IMPRESSION: 1. No definite acute findings are noted in the abdomen or pelvis to account for the patient's symptoms. 2. However, there is potential bladder wall thickening. Clinical correlation for signs and symptoms of cystitis is suggested. 3. In addition, there are trace bilateral pleural effusions lying dependently. 4. Aortic atherosclerosis. 5. Normal appendix. Electronically Signed   By: Vinnie Langton M.D.   On: 07/17/2018 17:16     Assessment/Plan Active Problems:   Sepsis ([place])   Leukocytosis   CKD (chronic kidney disease), stage III (HCC)   Schizophrenia (HCC)   Cellulitis of right lower extremity   Hyperlipidemia   Hypothyroidism   Sepsis probably secondary to cellulitis -Continue IV fluids.  Antibiotics plan as below.  Follow cultures -Check for  influenza.  Right lower extremely bacterial cellulitis -Continue Rocephin and vancomycin.  Follow cultures.  Leukocytosis -Probably secondary to above.  Repeat a.m. labs  Chronic kidney disease stage III -Stable.  Monitor creatinine  Schizoaffective disorder/schizophrenia -Continue outpatient regimen.  Outpatient follow-up with psychiatry  Hyperlipidemia -Continue ezetimibe  Hypothyroidism -Continue Synthroid  Microscopic hematuria -UA is negative for UTI although there is hematuria.  CT abdomen shows  potential for cystitis.  No stones.  Will monitor.  Patient is already on broad-spectrum antibiotics.  Might need outpatient urology evaluation    DVT prophylaxis: Lovenox Code Status: Full Family Communication: None at bedside Disposition Plan: Probably back to group home once clinically improved Consults called: None Admission status: Inpatient/telemetry  Severity of Illness: The appropriate patient status for this patient is INPATIENT. Inpatient status is judged to be reasonable and necessary in order to provide the required intensity of service to ensure the patient's safety. The patient's presenting symptoms, physical exam findings, and initial radiographic and laboratory data in the context of their chronic comorbidities is felt to place them at high risk for further clinical deterioration. Furthermore, it is not anticipated that the patient will be medically stable for discharge from the hospital within 2 midnights of admission. The following factors support the patient status of inpatient.   " The patient's presenting symptoms include fever. " The worrisome physical exam findings include fever/tachycardia/right lower eczematous cellulitis. " The initial radiographic and laboratory data are worrisome because of leukocytosis/elevated lactic acid level. " The chronic co-morbidities include chronic kidney disease stage III/schizoaffective disorder.   * I certify that at the point of admission it is my clinical judgment that the patient will require inpatient hospital care spanning beyond 2 midnights from the point of admission due to high intensity of service, high risk for further deterioration and high frequency of surveillance required.Aline August MD [facility] Pager [phone]  If 7PM-7AM, please contact night-coverage [email or web address] Password [email or web address]  07/17/2018, 5:59 PM

## 2018-07-17 NOTE — Progress Notes (Signed)
Pharmacy Antibiotic Note  [name] is a 67 y.o. male admitted on 07/17/2018 with cellulitis.  Pharmacy has been consulted for vancomycin dosing.  Plan:  Vancomycin 2g x 1 then 1250mg  IV q24 - goal AUC 400-500  Watch renal function, cultures, clinical progression closely  Height: 6' (182.9 cm) Weight: 275 lb (124.7 kg) IBW/kg (Calculated) : 77.6  Temp (24hrs), Avg:103.1 F (39.5 C), Min:103.1 F (39.5 C), Max:103.1 F (39.5 C)  Recent Labs  Lab 07/17/18 1454 07/17/18 1503 07/17/18 1738  WBC 17.4*  --   --   CREATININE 1.95*  --   --   LATICACIDVEN  --  2.48* 2.14*    Estimated Creatinine Clearance: 51.5 mL/min (A) (by C-G formula based on SCr of 1.95 mg/dL (H)).    Allergies  Allergen Reactions  . Sulfamethoxazole Rash    Thank you for allowing pharmacy to be a part of this patient's care.  Kara Mead 07/17/2018 6:06 PM

## 2018-07-18 LAB — CBC
HCT: 36.7 % — ABNORMAL LOW (ref 39.0–52.0)
Hemoglobin: 12.2 g/dL — ABNORMAL LOW (ref 13.0–17.0)
MCH: 31.2 pg (ref 26.0–34.0)
MCHC: 33.2 g/dL (ref 30.0–36.0)
MCV: 93.9 fL (ref 78.0–100.0)
Platelets: 147 10*3/uL — ABNORMAL LOW (ref 150–400)
RBC: 3.91 MIL/uL — AB (ref 4.22–5.81)
RDW: 14.4 % (ref 11.5–15.5)
WBC: 28.4 10*3/uL — AB (ref 4.0–10.5)

## 2018-07-18 LAB — COMPREHENSIVE METABOLIC PANEL
ALT: 30 U/L (ref 0–44)
AST: 49 U/L — AB (ref 15–41)
Albumin: 2.8 g/dL — ABNORMAL LOW (ref 3.5–5.0)
Alkaline Phosphatase: 62 U/L (ref 38–126)
Anion gap: 8 (ref 5–15)
BILIRUBIN TOTAL: 0.7 mg/dL (ref 0.3–1.2)
BUN: 23 mg/dL (ref 8–23)
CHLORIDE: 111 mmol/L (ref 98–111)
CO2: 20 mmol/L — ABNORMAL LOW (ref 22–32)
CREATININE: 2 mg/dL — AB (ref 0.61–1.24)
Calcium: 8.4 mg/dL — ABNORMAL LOW (ref 8.9–10.3)
GFR calc non Af Amer: 33 mL/min — ABNORMAL LOW (ref >60)
GFR, EST AFRICAN AMERICAN: 39 mL/min — AB (ref >60)
Glucose, Bld: 177 mg/dL — ABNORMAL HIGH (ref 70–99)
POTASSIUM: 4.4 mmol/L (ref 3.5–5.1)
Sodium: 139 mmol/L (ref 135–145)
TOTAL PROTEIN: 4.8 g/dL — AB (ref 6.5–8.1)

## 2018-07-18 LAB — PROCALCITONIN: PROCALCITONIN: 23.72 ng/mL

## 2018-07-18 LAB — LACTIC ACID, PLASMA: LACTIC ACID, VENOUS: 2.2 mmol/L — AB (ref 0.5–1.9)

## 2018-07-18 LAB — MAGNESIUM: MAGNESIUM: 1.6 mg/dL — AB (ref 1.7–2.4)

## 2018-07-18 MED ORDER — MAGNESIUM SULFATE 2 GM/50ML IV SOLN
2.0000 g | Freq: Once | INTRAVENOUS | Status: AC
  Administered 2018-07-18: 2 g via INTRAVENOUS
  Filled 2018-07-18: qty 50

## 2018-07-18 MED ORDER — SODIUM CHLORIDE 0.9 % IV BOLUS
1000.0000 mL | Freq: Once | INTRAVENOUS | Status: AC
  Administered 2018-07-18: 1000 mL via INTRAVENOUS

## 2018-07-18 NOTE — Progress Notes (Signed)
PT Cancellation Note  Patient Details Name: [name] MRN: [id number] DOB: [birth year]   Cancelled Treatment:    Reason Eval/Treat Not Completed: Fatigue/lethargy limiting ability to participate. Patient is sleeping, barely arouses. Has been up in recliner today by nursing. Check back tomorrow.   Marcelino Freestone PT [phone]  07/18/2018, 4:19 PM

## 2018-07-18 NOTE — Progress Notes (Signed)
CRITICAL VALUE ALERT  Critical Value:  Lactic 2.2  Date & Time Notied:  07/18/18 0300  Provider Notified: Silas [place] NP   Orders Received/Actions taken: Awaiting orders

## 2018-07-18 NOTE — Progress Notes (Signed)
Patient ID: [name], male   DOB: 1951/05/19, 67 y.o.   MRN: [id number]  PROGRESS NOTE    [name]  JKD:[id number] DOB: [birth year] DOA: 07/17/2018 PCP: Todd Shell, FNP   Brief Narrative:  67 y.o. male with medical history significant of schizoaffective disorder who lives in a group home, non-Hodgkin's lymphoma, chronic kidney disease stage III, COPD, hypertension, hyperlipidemia presented from the group home with fever and chills.  He was found to have right lower extremity cellulitis and started on broad-spectrum antibiotics. CT of the abdomen pelvis did not show any acute finding except for potential bladder wall thickening.   Assessment & Plan:   Active Problems:   Sepsis ([place])   Leukocytosis   CKD (chronic kidney disease), stage III (HCC)   Schizophrenia (HCC)   Cellulitis of right lower extremity   Hyperlipidemia   Hypothyroidism   Sepsis probably secondary to cellulitis with lactic acidosis and leukocytosis -Improving.  Decrease normal saline to 100 cc an hour.  Antibiotics plan as below.  Follow cultures -Influenza negative.  Discontinue droplet precautions  Right lower extremely bacterial cellulitis -Erythema is improving.  Continue Rocephin and vancomycin.  Follow cultures.  Leukocytosis -Probably secondary to above.  Worsening. Repeat a.m. Labs  Thrombocytopenia -Probably secondary to sepsis.  Repeat a.m. labs.  No signs of bleeding.  Hypomagnesemia -Replace.  Repeat a.m. labs  Chronic kidney disease stage III -Stable.  Monitor creatinine  Schizoaffective disorder/schizophrenia -Continue outpatient regimen.  Outpatient follow-up with psychiatry  Hyperlipidemia -Continue ezetimibe  Hypothyroidism -Continue Synthroid  Microscopic hematuria -UA is negative for UTI although there is hematuria.  CT abdomen shows potential for cystitis.  No stones.  Will monitor.  Patient is already on broad-spectrum antibiotics.  Might need outpatient  urology evaluation    DVT prophylaxis: Lovenox Code Status: Full Family Communication: None at bedside Disposition Plan: Probably back to group home once clinically improved  Procedures: None  Antimicrobials: Rocephin and vancomycin from 07/17/2018 onwards   Subjective: Patient seen and examined at bedside.  He is sleepy, wakes up slightly and answers some questions but is a poor historian.  No overnight vomiting.  Patient has had temperature spikes overnight.  Objective: Vitals:   07/17/18 2039 07/18/18 0000 07/18/18 0208 07/18/18 0445  BP: (!) 119/54 107/77 (!) 113/47 (!) 114/47  Pulse: (!) 105 (!) 102 (!) 101 98  Resp: 16   18  Temp: 97.8 F (36.6 C) (!) 101.1 F (38.4 C) (!) 100.8 F (38.2 C) 98.7 F (37.1 C)  TempSrc: Oral Rectal Rectal Oral  SpO2: 98%  97% 95%  Weight:    128.8 kg  Height:        Intake/Output Summary (Last 24 hours) at 07/18/2018 1001 Last data filed at 07/18/2018 0951 Gross per 24 hour  Intake 7343.19 ml  Output 200 ml  Net 7143.19 ml   Filed Weights   07/17/18 1605 07/18/18 0445  Weight: 124.7 kg 128.8 kg    Examination:  General exam: Appears sleepy but wakes up slightly on calling his name.  Poor historian.  No distress respiratory system: Bilateral decreased breath sounds at bases Cardiovascular system: S1 & S2 heard, Rate controlled Gastrointestinal system: Abdomen is nondistended, soft and nontender. Normal bowel sounds heard. Extremities: No cyanosis; right lower extremity edema present Skin: Right lower extremity is erythematous with mild swelling and tenderness from knee down to the ankle with no ulcer or drainage, lesions, ulcers.  Erythema is improving.  No induration  Data Reviewed: I have personally reviewed following labs and imaging studies  CBC: Recent Labs  Lab 07/17/18 1454 07/18/18 0227  WBC 17.4* 28.4*  NEUTROABS 16.5*  --   HGB 13.9 12.2*  HCT 42.1 36.7*  MCV 93.3 93.9  PLT 160 381*   Basic Metabolic  Panel: Recent Labs  Lab 07/17/18 1454 07/18/18 0227  NA 142 139  K 4.3 4.4  CL 107 111  CO2 25 20*  GLUCOSE 141* 177*  BUN 21 23  CREATININE 1.95* 2.00*  CALCIUM 9.2 8.4*  MG  --  1.6*   GFR: Estimated Creatinine Clearance: 51.1 mL/min (A) (by C-G formula based on SCr of 2 mg/dL (H)). Liver Function Tests: Recent Labs  Lab 07/17/18 1454 07/18/18 0227  AST 25 49*  ALT 18 30  ALKPHOS 92 62  BILITOT 0.4 0.7  PROT 6.3* 4.8*  ALBUMIN 4.1 2.8*   No results for input(s): LIPASE, AMYLASE in the last 168 hours. No results for input(s): AMMONIA in the last 168 hours. Coagulation Profile: Recent Labs  Lab 07/17/18 1454  INR 0.98   Cardiac Enzymes: No results for input(s): CKTOTAL, CKMB, CKMBINDEX, TROPONINI in the last 168 hours. BNP (last 3 results) Recent Labs    09/14/17 1527  PROBNP 57   HbA1C: No results for input(s): HGBA1C in the last 72 hours. CBG: No results for input(s): GLUCAP in the last 168 hours. Lipid Profile: No results for input(s): CHOL, HDL, LDLCALC, TRIG, CHOLHDL, LDLDIRECT in the last 72 hours. Thyroid Function Tests: No results for input(s): TSH, T4TOTAL, FREET4, T3FREE, THYROIDAB in the last 72 hours. Anemia Panel: No results for input(s): VITAMINB12, FOLATE, FERRITIN, TIBC, IRON, RETICCTPCT in the last 72 hours. Sepsis Labs: Recent Labs  Lab 07/17/18 1503 07/17/18 1738 07/17/18 2240 07/18/18 0227  PROCALCITON  --   --  23.72  --   LATICACIDVEN 2.48* 2.14* 3.4* 2.2*    Recent Results (from the past 240 hour(s))  MRSA PCR Screening     Status: Abnormal   Collection Time: 07/17/18  7:44 PM  Result Value Ref Range Status   MRSA by PCR POSITIVE (A) NEGATIVE Final    Comment:        The GeneXpert MRSA Assay (FDA approved for NASAL specimens only), is one component of a comprehensive MRSA colonization surveillance program. It is not intended to diagnose MRSA infection nor to guide or monitor treatment for MRSA infections. RESULT  CALLED TO, READ BACK BY AND VERIFIED WITH: A DICKEY,RN @2204  07/17/18 MKELLY Performed at [facility], [place] [address], [place],  [address]          Radiology Studies: Dg Chest 2 View  Result Date: 07/17/2018 CLINICAL DATA:  Onset shaking and chills today. EXAM: CHEST - 2 VIEW COMPARISON:  PA and lateral chest 12/02/2017 and 10/12/2017. FINDINGS: Lung volumes are low with mild left basilar atelectasis. No consolidative process, pneumothorax or effusion. Heart size is normal. No acute or focal bony abnormality. IMPRESSION: No acute finding in a low volume chest. Electronically Signed   By: Inge Rise M.D.   On: 07/17/2018 15:24   Ct Renal Stone Study  Result Date: 07/17/2018 CLINICAL DATA:  67 year old male with history of chills and shaking. Tachycardia. Febrile. EXAM: CT ABDOMEN AND PELVIS WITHOUT CONTRAST TECHNIQUE: Multidetector CT imaging of the abdomen and pelvis was performed following the standard protocol without IV contrast. COMPARISON:  CT the abdomen and pelvis 02/19/2018. FINDINGS: Lower chest: Trace bilateral pleural effusions lying dependently. Linear  scarring in the left lower lobe, similar to the prior study. Hepatobiliary: No suspicious cystic or solid hepatic lesions are confidently identified on today's noncontrast CT examination. Unenhanced appearance of the gallbladder is normal. Pancreas: No definite pancreatic mass or peripancreatic fluid or inflammatory changes are noted on today's noncontrast CT examination. Spleen: Unremarkable. Adrenals/Urinary Tract: Unenhanced appearance of the kidneys and bilateral adrenal glands is normal. No hydroureteronephrosis. Urinary bladder is nearly decompressed, but the urinary bladder wall appears potentially thickened. Stomach/Bowel: Unenhanced appearance of the stomach is normal. No pathologic dilatation of small bowel or colon. Normal appendix. Vascular/Lymphatic: Aortic atherosclerosis. No lymphadenopathy  noted in the abdomen or pelvis. Reproductive: Prostate gland and seminal vesicles are unremarkable in appearance. Other: No significant volume of ascites.  No pneumoperitoneum. Musculoskeletal: There are no aggressive appearing lytic or blastic lesions noted in the visualized portions of the skeleton. IMPRESSION: 1. No definite acute findings are noted in the abdomen or pelvis to account for the patient's symptoms. 2. However, there is potential bladder wall thickening. Clinical correlation for signs and symptoms of cystitis is suggested. 3. In addition, there are trace bilateral pleural effusions lying dependently. 4. Aortic atherosclerosis. 5. Normal appendix. Electronically Signed   By: Vinnie Langton M.D.   On: 07/17/2018 17:16        Scheduled Meds: . aspirin EC  81 mg Oral Daily  . Chlorhexidine Gluconate Cloth  6 each Topical Q0600  . chlorproMAZINE  100 mg Oral QHS  . chlorproMAZINE  50 mg Oral QID  . cloZAPine  150 mg Oral BID  . enoxaparin (LOVENOX) injection  40 mg Subcutaneous Q24H  . escitalopram  10 mg Oral QAC breakfast  . lamoTRIgine  50 mg Oral TID  . levothyroxine  100 mcg Oral QAC breakfast  . mupirocin ointment  1 application Nasal BID  . polyethylene glycol  17 g Oral Daily  . senna  1 tablet Oral Daily   Continuous Infusions: . sodium chloride 125 mL/hr at 07/17/18 2352  . sodium chloride 125 mL/hr at 07/18/18 0600  . cefTRIAXone (ROCEPHIN)  IV    . vancomycin       LOS: 1 day        Aline August, MD [facility] Pager [phone]  If 7PM-7AM, please contact night-coverage [email or web address] Password [email or web address] 07/18/2018, 10:01 AM

## 2018-07-18 NOTE — Progress Notes (Signed)
CRITICAL VALUE ALERT  Critical Value: Lactic 3.4  Date & Time Notied:  07/18/18 2351  Provider Notified: Silas Sacramento NP   Orders Received/Actions taken: 1L fluid bolus. Vital signs taken, BP 107/77, HR 105, Rectal temp 101.1. Tylenol given.

## 2018-07-19 DIAGNOSIS — G259 Extrapyramidal and movement disorder, unspecified: Secondary | ICD-10-CM

## 2018-07-19 LAB — CBC WITH DIFFERENTIAL/PLATELET
BASOS ABS: 0 10*3/uL (ref 0.0–0.1)
BASOS PCT: 0 %
EOS ABS: 0.2 10*3/uL (ref 0.0–0.7)
EOS PCT: 1 %
HCT: 35.5 % — ABNORMAL LOW (ref 39.0–52.0)
Hemoglobin: 11.7 g/dL — ABNORMAL LOW (ref 13.0–17.0)
Lymphocytes Relative: 7 %
Lymphs Abs: 1 10*3/uL (ref 0.7–4.0)
MCH: 31.1 pg (ref 26.0–34.0)
MCHC: 33 g/dL (ref 30.0–36.0)
MCV: 94.4 fL (ref 78.0–100.0)
MONO ABS: 0.6 10*3/uL (ref 0.1–1.0)
Monocytes Relative: 5 %
Neutro Abs: 11.7 10*3/uL — ABNORMAL HIGH (ref 1.7–7.7)
Neutrophils Relative %: 87 %
PLATELETS: 113 10*3/uL — AB (ref 150–400)
RBC: 3.76 MIL/uL — ABNORMAL LOW (ref 4.22–5.81)
RDW: 14.6 % (ref 11.5–15.5)
WBC: 13.4 10*3/uL — ABNORMAL HIGH (ref 4.0–10.5)

## 2018-07-19 LAB — COMPREHENSIVE METABOLIC PANEL
ALT: 180 U/L — ABNORMAL HIGH (ref 0–44)
AST: 162 U/L — AB (ref 15–41)
Albumin: 2.7 g/dL — ABNORMAL LOW (ref 3.5–5.0)
Alkaline Phosphatase: 104 U/L (ref 38–126)
Anion gap: 7 (ref 5–15)
BUN: 21 mg/dL (ref 8–23)
CHLORIDE: 112 mmol/L — AB (ref 98–111)
CO2: 22 mmol/L (ref 22–32)
Calcium: 8.6 mg/dL — ABNORMAL LOW (ref 8.9–10.3)
Creatinine, Ser: 1.78 mg/dL — ABNORMAL HIGH (ref 0.61–1.24)
GFR calc Af Amer: 44 mL/min — ABNORMAL LOW (ref >60)
GFR, EST NON AFRICAN AMERICAN: 38 mL/min — AB (ref >60)
Glucose, Bld: 160 mg/dL — ABNORMAL HIGH (ref 70–99)
POTASSIUM: 4.2 mmol/L (ref 3.5–5.1)
SODIUM: 141 mmol/L (ref 135–145)
Total Bilirubin: 2.2 mg/dL — ABNORMAL HIGH (ref 0.3–1.2)
Total Protein: 5.1 g/dL — ABNORMAL LOW (ref 6.5–8.1)

## 2018-07-19 LAB — URINE CULTURE: CULTURE: NO GROWTH

## 2018-07-19 LAB — MAGNESIUM: MAGNESIUM: 2.1 mg/dL (ref 1.7–2.4)

## 2018-07-19 MED ORDER — BENZTROPINE MESYLATE 0.5 MG PO TABS
0.5000 mg | ORAL_TABLET | Freq: Two times a day (BID) | ORAL | Status: DC
  Administered 2018-07-19 – 2018-07-20 (×2): 0.5 mg via ORAL
  Filled 2018-07-19 (×2): qty 1

## 2018-07-19 MED ORDER — CHLORPROMAZINE HCL 25 MG PO TABS
25.0000 mg | ORAL_TABLET | Freq: Three times a day (TID) | ORAL | Status: DC
  Administered 2018-07-19 – 2018-07-20 (×2): 25 mg via ORAL
  Filled 2018-07-19 (×5): qty 1

## 2018-07-19 MED ORDER — SODIUM CHLORIDE 0.9 % IV SOLN
INTRAVENOUS | Status: DC | PRN
  Administered 2018-07-19: 1000 mL via INTRAVENOUS

## 2018-07-19 NOTE — Progress Notes (Signed)
Patient ID: [name], male   DOB: 07-11-1951, 67 y.o.   MRN: [id number]  PROGRESS NOTE    [name]  GBT:[id number] DOB: Nov 20, 1950 DOA: 07/17/2018 PCP: [name], FNP   Brief Narrative:  67 y.o. male with medical history significant of schizoaffective disorder who lives in a group home, non-Hodgkin's lymphoma, chronic kidney disease stage III, COPD, hypertension, hyperlipidemia presented from the group home with fever and chills.  He was found to have right lower extremity cellulitis and started on broad-spectrum antibiotics. CT of the abdomen pelvis did not show any acute finding except for potential bladder wall thickening.   Assessment & Plan:   Active Problems:   Sepsis ([place])   Leukocytosis   CKD (chronic kidney disease), stage III (HCC)   Schizophrenia (HCC)   Cellulitis of right lower extremity   Hyperlipidemia   Hypothyroidism   Sepsis probably secondary to cellulitis with lactic acidosis and leukocytosis -Improving.  Discontinue IV fluids. Antibiotics plan as below.  Cultures negative so far. -Influenza negative.   Right lower extremely bacterial cellulitis -Erythema is improving.  Continue Rocephin and vancomycin.   Leukocytosis -Probably secondary to above.  Improving. Repeat a.m. Labs  Elevated LFTs -Probably secondary to sepsis.  Repeat a.m. labs.  If LFTs worsen, consider right upper quadrant ultrasound.  CT of the abdomen did not comment about any abnormality in the liver/gallbladder  Thrombocytopenia -Probably secondary to sepsis.  Repeat a.m. labs.  No signs of bleeding.  Hypomagnesemia -Improved.  Chronic kidney disease stage III -Stable.  Monitor creatinine  Schizoaffective disorder/schizophrenia -Patient has been very sleepy.  Will request psychiatry evaluation if patient needs adjustment in his regimen.  Till then, continue outpatient regimen.    Hyperlipidemia -Continue ezetimibe  Hypothyroidism -Continue  Synthroid  Microscopic hematuria -UA is negative for UTI although there is hematuria.  CT abdomen shows potential for cystitis.  No stones.  Will monitor.  Patient is already on broad-spectrum antibiotics.  Might need outpatient urology evaluation  Generalized deconditioning -PT eval   DVT prophylaxis: Lovenox Code Status: Full Family Communication: None at bedside Disposition Plan: Probably back to group home in a few days once clinically improved  Procedures: None  Antimicrobials: Rocephin and vancomycin from 07/17/2018 onwards   Subjective: Patient seen and examined at bedside.  He is sleepy, wakes up slightly, answers some questions.  Poor historian.  Nursing staff report that patient has been very drowsy overnight and this morning.  No overnight fever or vomiting.  Objective: Vitals:   07/18/18 1308 07/18/18 2102 07/19/18 0004 07/19/18 0431  BP: 117/63 (!) 121/53 117/62 (!) 116/57  Pulse: 87 92 96 80  Resp: 20 18  20   Temp: 99.4 F (37.4 C) 98.8 F (37.1 C) 99.1 F (37.3 C) 98.6 F (37 C)  TempSrc: Oral Oral Oral Oral  SpO2: 97% 96% 95% 95%  Weight:    130.6 kg  Height:        Intake/Output Summary (Last 24 hours) at 07/19/2018 1102 Last data filed at 07/19/2018 0830 Gross per 24 hour  Intake 4262.63 ml  Output 625 ml  Net 3637.63 ml   Filed Weights   07/17/18 1605 07/18/18 0445 07/19/18 0431  Weight: 124.7 kg 128.8 kg 130.6 kg    Examination:  General exam: Appears sleepy but wakes up slightly on calling his name.  Poor historian.  No acute distress  respiratory system: Bilateral decreased breath sounds at bases, no wheezing Cardiovascular system: Rate controlled, S1-S2 heard Gastrointestinal system: Abdomen is  nondistended, soft and nontender. Normal bowel sounds heard. Extremities: No cyanosis; right lower extremity edema present Skin: Right lower extremity is still slightly erythematous with mild swelling and tenderness from knee down to the ankle with  no ulcer or drainage, lesions, ulcers.  No drainage.    Data Reviewed: I have personally reviewed following labs and imaging studies  CBC: Recent Labs  Lab 07/17/18 1454 07/18/18 0227 07/19/18 0507  WBC 17.4* 28.4* 13.4*  NEUTROABS 16.5*  --  11.7*  HGB 13.9 12.2* 11.7*  HCT 42.1 36.7* 35.5*  MCV 93.3 93.9 94.4  PLT 160 147* 283*   Basic Metabolic Panel: Recent Labs  Lab 07/17/18 1454 07/18/18 0227 07/19/18 0507  NA 142 139 141  K 4.3 4.4 4.2  CL 107 111 112*  CO2 25 20* 22  GLUCOSE 141* 177* 160*  BUN 21 23 21   CREATININE 1.95* 2.00* 1.78*  CALCIUM 9.2 8.4* 8.6*  MG  --  1.6* 2.1   GFR: Estimated Creatinine Clearance: 57.8 mL/min (A) (by C-G formula based on SCr of 1.78 mg/dL (H)). Liver Function Tests: Recent Labs  Lab 07/17/18 1454 07/18/18 0227 07/19/18 0507  AST 25 49* 162*  ALT 18 30 180*  ALKPHOS 92 62 104  BILITOT 0.4 0.7 2.2*  PROT 6.3* 4.8* 5.1*  ALBUMIN 4.1 2.8* 2.7*   No results for input(s): LIPASE, AMYLASE in the last 168 hours. No results for input(s): AMMONIA in the last 168 hours. Coagulation Profile: Recent Labs  Lab 07/17/18 1454  INR 0.98   Cardiac Enzymes: No results for input(s): CKTOTAL, CKMB, CKMBINDEX, TROPONINI in the last 168 hours. BNP (last 3 results) Recent Labs    09/14/17 1527  PROBNP 57   HbA1C: No results for input(s): HGBA1C in the last 72 hours. CBG: No results for input(s): GLUCAP in the last 168 hours. Lipid Profile: No results for input(s): CHOL, HDL, LDLCALC, TRIG, CHOLHDL, LDLDIRECT in the last 72 hours. Thyroid Function Tests: No results for input(s): TSH, T4TOTAL, FREET4, T3FREE, THYROIDAB in the last 72 hours. Anemia Panel: No results for input(s): VITAMINB12, FOLATE, FERRITIN, TIBC, IRON, RETICCTPCT in the last 72 hours. Sepsis Labs: Recent Labs  Lab 07/17/18 1503 07/17/18 1738 07/17/18 2240 07/18/18 0227  PROCALCITON  --   --  23.72  --   LATICACIDVEN 2.48* 2.14* 3.4* 2.2*    Recent  Results (from the past 240 hour(s))  Urine culture     Status: None   Collection Time: 07/17/18  4:33 PM  Result Value Ref Range Status   Specimen Description   Final    URINE, CLEAN CATCH Performed at [facility], [place] [address] [place], [place] [address]    Special Requests   Final    NONE Performed at [facility], [place] [address] [place], [place] [address]    Culture   Final    NO GROWTH Performed at [facility], [place] [address], [place],  [address]    Report Status 07/19/2018 FINAL  Final  MRSA PCR Screening     Status: Abnormal   Collection Time: 07/17/18  7:44 PM  Result Value Ref Range Status   MRSA by PCR POSITIVE (A) NEGATIVE Final    Comment:        The GeneXpert MRSA Assay (FDA approved for NASAL specimens only), is one component of a comprehensive MRSA colonization surveillance program. It is not intended to diagnose MRSA infection nor to guide or monitor treatment for MRSA infections.  RESULT CALLED TO, READ BACK BY AND VERIFIED WITH: A DICKEY,RN @2204  07/17/18 MKELLY Performed at [facility], [place] [address], [place], [place] [address]          Radiology Studies: Dg Chest 2 View  Result Date: 07/17/2018 CLINICAL DATA:  Onset shaking and chills today. EXAM: CHEST - 2 VIEW COMPARISON:  PA and lateral chest 12/02/2017 and 10/12/2017. FINDINGS: Lung volumes are low with mild left basilar atelectasis. No consolidative process, pneumothorax or effusion. Heart size is normal. No acute or focal bony abnormality. IMPRESSION: No acute finding in a low volume chest. Electronically Signed   By: Inge Rise M.D.   On: 07/17/2018 15:24   Ct Renal Stone Study  Result Date: 07/17/2018 CLINICAL DATA:  67 year old male with history of chills and shaking. Tachycardia. Febrile. EXAM: CT ABDOMEN AND PELVIS WITHOUT CONTRAST TECHNIQUE: Multidetector CT imaging of the abdomen and pelvis was performed  following the standard protocol without IV contrast. COMPARISON:  CT the abdomen and pelvis 02/19/2018. FINDINGS: Lower chest: Trace bilateral pleural effusions lying dependently. Linear scarring in the left lower lobe, similar to the prior study. Hepatobiliary: No suspicious cystic or solid hepatic lesions are confidently identified on today's noncontrast CT examination. Unenhanced appearance of the gallbladder is normal. Pancreas: No definite pancreatic mass or peripancreatic fluid or inflammatory changes are noted on today's noncontrast CT examination. Spleen: Unremarkable. Adrenals/Urinary Tract: Unenhanced appearance of the kidneys and bilateral adrenal glands is normal. No hydroureteronephrosis. Urinary bladder is nearly decompressed, but the urinary bladder wall appears potentially thickened. Stomach/Bowel: Unenhanced appearance of the stomach is normal. No pathologic dilatation of small bowel or colon. Normal appendix. Vascular/Lymphatic: Aortic atherosclerosis. No lymphadenopathy noted in the abdomen or pelvis. Reproductive: Prostate gland and seminal vesicles are unremarkable in appearance. Other: No significant volume of ascites.  No pneumoperitoneum. Musculoskeletal: There are no aggressive appearing lytic or blastic lesions noted in the visualized portions of the skeleton. IMPRESSION: 1. No definite acute findings are noted in the abdomen or pelvis to account for the patient's symptoms. 2. However, there is potential bladder wall thickening. Clinical correlation for signs and symptoms of cystitis is suggested. 3. In addition, there are trace bilateral pleural effusions lying dependently. 4. Aortic atherosclerosis. 5. Normal appendix. Electronically Signed   By: Vinnie Langton M.D.   On: 07/17/2018 17:16        Scheduled Meds: . aspirin EC  81 mg Oral Daily  . Chlorhexidine Gluconate Cloth  6 each Topical Q0600  . chlorproMAZINE  100 mg Oral QHS  . chlorproMAZINE  50 mg Oral QID  .  cloZAPine  150 mg Oral BID  . enoxaparin (LOVENOX) injection  40 mg Subcutaneous Q24H  . escitalopram  10 mg Oral QAC breakfast  . lamoTRIgine  50 mg Oral TID  . levothyroxine  100 mcg Oral QAC breakfast  . mupirocin ointment  1 application Nasal BID  . polyethylene glycol  17 g Oral Daily  . senna  1 tablet Oral Daily   Continuous Infusions: . sodium chloride 100 mL/hr at 07/19/18 1012  . cefTRIAXone (ROCEPHIN)  IV 2 g (07/19/18 0829)  . vancomycin Stopped (07/18/18 2127)     LOS: 2 days        Aline August, MD [facility] Pager [phone]  If 7PM-7AM, please contact night-coverage [email or web address] Password [email or web address] 07/19/2018, 11:02 AM

## 2018-07-19 NOTE — Evaluation (Signed)
Physical Therapy Evaluation Patient Details Name: [name] MRN: [id number] DOB: August 09, 1951 Today's Date: 07/19/2018   History of Present Illness  67 y.o. male with medical history significant of schizoaffective disorder who lives in a group home, non-Hodgkin's lymphoma, chronic kidney disease stage III, COPD, CHF, hypertension, hyperlipidemia presented from the group home with fever and chills.  He was found to have right lower extremity cellulitis   Clinical Impression  Pt admitted with above diagnosis. Pt currently with functional limitations due to the deficits listed below (see PT Problem List).  Pt will benefit from skilled PT to increase their independence and safety with mobility to allow discharge to the venue listed below.  Pt poor historian however follows commands and very cooperative.  Pt ambulated short distance in hallway with RW for support.  Pt reports 2 recent falls prior to admission.  Pt from group home per chart review.  If unable to return to group home, pt may need SNF.     Follow Up Recommendations Home health PT;Supervision/Assistance - 24 hour(if group home unable to provide current level then pt may need SNF)    Equipment Recommendations  None recommended by PT    Recommendations for Other Services       Precautions / Restrictions Precautions Precautions: Fall      Mobility  Bed Mobility Overal bed mobility: Needs Assistance Bed Mobility: Supine to Sit;Sit to Supine     Supine to sit: Min guard;HOB elevated Sit to supine: Min guard   General bed mobility comments: verbal cues for technique  Transfers Overall transfer level: Needs assistance Equipment used: Rolling walker (2 wheeled) Transfers: Sit to/from Stand Sit to Stand: Min assist         General transfer comment: slight assist for rise and steady, cues for controlled descent (pt rushing to continue watching TV program)  Ambulation/Gait Ambulation/Gait assistance: Min assist;Min  guard Gait Distance (Feet): 80 Feet Assistive device: Rolling walker (2 wheeled) Gait Pattern/deviations: Step-through pattern;Decreased stride length;Trunk flexed;Wide base of support     General Gait Details: verbal cues for safe use of RW, posture, varied pace so cues for safe pace (pt rushing), dyspnea and increased WOB upon return to room however SPO2 96% on room air; pt denies any increase in R LE pain  Stairs            Wheelchair Mobility    Modified Rankin (Stroke Patients Only)       Balance Overall balance assessment: History of Falls(pt reports 2 recent falls)                                           Pertinent Vitals/Pain Pain Assessment: Faces Faces Pain Scale: Hurts a little bit Pain Location: R LE Pain Descriptors / Indicators: Aching;Sore Pain Intervention(s): Limited activity within patient's tolerance;Repositioned;Monitored during session    Home Living Family/patient expects to be discharged to:: Group home                 Additional Comments: poor historian    Prior Function Level of Independence: Independent with assistive device(s)         Comments: requested use of RW, states he can use "at home"     Hand Dominance        Extremity/Trunk Assessment        Lower Extremity Assessment Lower Extremity Assessment: Generalized weakness  Communication   Communication: No difficulties  Cognition Arousal/Alertness: Awake/alert Behavior During Therapy: WFL for tasks assessed/performed Overall Cognitive Status: No family/caregiver present to determine baseline cognitive functioning                                 General Comments: cooperative, follows commands, wanted to watch [employer] is Right      General Comments      Exercises     Assessment/Plan    PT Assessment Patient needs continued PT services  PT Problem List Decreased strength;Decreased mobility;Decreased activity  tolerance;Decreased balance;Decreased knowledge of use of DME;Decreased safety awareness       PT Treatment Interventions DME instruction;Therapeutic activities;Gait training;Therapeutic exercise;Patient/family education;Functional mobility training;Balance training    PT Goals (Current goals can be found in the Care Plan section)  Acute Rehab PT Goals PT Goal Formulation: With patient Time For Goal Achievement: 08/02/18 Potential to Achieve Goals: Good    Frequency Min 3X/week   Barriers to discharge        Co-evaluation               AM-PAC PT "6 Clicks" Daily Activity  Outcome Measure Difficulty turning over in bed (including adjusting bedclothes, sheets and blankets)?: A Lot Difficulty moving from lying on back to sitting on the side of the bed? : A Lot Difficulty sitting down on and standing up from a chair with arms (e.g., wheelchair, bedside commode, etc,.)?: Unable Help needed moving to and from a bed to chair (including a wheelchair)?: A Little Help needed walking in hospital room?: A Little Help needed climbing 3-5 steps with a railing? : A Lot 6 Click Score: 13    End of Session Equipment Utilized During Treatment: Gait belt Activity Tolerance: Patient tolerated treatment well Patient left: in bed;with bed alarm set;with call bell/phone within reach   PT Visit Diagnosis: Difficulty in walking, not elsewhere classified (R26.2)    Time: 7262-0355 PT Time Calculation (min) (ACUTE ONLY): 18 min   Charges:   PT Evaluation $PT Eval Low Complexity: 1 Low         Carmelia Bake, PT, DPT 07/19/2018 Pager: [phone]  York Ram E 07/19/2018, 12:48 PM

## 2018-07-19 NOTE — Consult Note (Signed)
[facility] Consult   Reason for Consult:  Medication management  Referring Physician:  Internal Medicine  Patient Identification: [name] MRN:  [id number] Principal Diagnosis: <principal problem not specified> Diagnosis:   Patient Active Problem List   Diagnosis Date Noted  . Small bowel obstruction ([place]) [K56.609] 02/19/2018  . Cellulitis [L03.90] 12/02/2017  . Severe left ventricular hypertrophy [I51.7] 10/21/2017  . Stage II pressure injury of buttocks [L89.90] 10/03/2017  . Obesity (BMI 30-39.9) [E66.9] 10/03/2017  . Hyperlipidemia [E78.5] 10/03/2017  . Hypothyroidism [E03.9] 10/03/2017  . RBBB [I45.10] 09/17/2017  . Lobar pneumonia ([place]) [J18.1] 02/12/2016  . Cellulitis of right lower extremity [L03.115] 03/28/2015  . Sepsis ([place]) [A41.9] 03/27/2015  . Prolonged QT interval [R94.31]   . CLL (chronic lymphocytic leukemia) ([place]) [C91.90] 09/12/2013  . Non Hodgkin's lymphoma ([place]) [C85.90] 09/12/2013  . Leukocytosis [D72.829] 12/24/2012  . Anemia [D64.9] 12/24/2012  . CKD (chronic kidney disease), stage III ([place]) [N18.3] 12/24/2012  . Schizophrenia ([place]) [F20.9] 12/24/2012    Total Time spent with patient: 15 minutes  Subjective:   [name] is a 67 y.o. male was observed resting in bed.  Patient is awake, alert and oriented x3.  Reports taking medications as prescribed and tolerating them well.  Denies medication side effects i.e. Headache, nausea, vomiting,dizziness, muscle aches or fatigue.  Reports she is followed by a psychiatrist at the nursing home and states he has been coming out more frequently.  Per nursing staff on night she has had concerns with medication oversedation with involuntary muscle movement to tongue the neck.  No EPS noted during this assessment.  Patient denies auditory or visual hallucinations.  Reports his previous inpatient admission was 2010.  See assessment and plan for medication consideration adjustment.  NP consulted  with MD Dwyane Dee.  Support, encouragement and reassurance was provided.  HPI: per admission assessment 67 y.o.[name] medical history significant ofschizoaffective disorder who lives in a group home, non-Hodgkin's lymphoma, chronic kidney disease stage III, COPD, hypertension, hyperlipidemia presented from the group home with fever and chills.  He was found to have right lower extremity cellulitis and started on broad-spectrum antibiotics. CT of the abdomen pelvis did not show any acute finding except for potential bladder wall thickening  Past Psychiatric History: Schizophrenia ([name])  Risk to Self:   Risk to Others:   Prior Inpatient Therapy:   Prior Outpatient Therapy:    Past Medical History:  Past Medical History:  Diagnosis Date  . Cellulitis   . CHF (congestive heart failure) ([place])   . Chronic kidney disease 09/20/2008  . Chronic lymphocytic leukemia ([name])   . cll dx'd 10/2003   no rx  . CLL (chronic lymphoblastic leukemia) 10/2003  . COPD (chronic obstructive pulmonary disease) ([name])   . GERD (gastroesophageal reflux disease)   . Hiatal hernia   . Hyperlipemia   . Hypertension   . Lower extremity edema   . NHL (non-Hodgkin's lymphoma) ([name]) dx'd 08/2008 rt groin   chemo comp 08/2008  . Non Hodgkin's lymphoma ([name]) 08/2008  . Non Hodgkin's lymphoma ([name])   . Paranoid schizophrenia ([name])   . Prolonged Q-T interval on ECG   . RBBB 09/17/2017  . Schizoaffective disorder ([place])   . Sepsis ([name])   . Severe left ventricular hypertrophy 10/21/2017  . Vitamin D deficiency     Past Surgical History:  Procedure Laterality Date  . LYMPH NODE BIOPSY     Family History:  Family History  Problem Relation Age of Onset  .  Alcohol abuse Father    Family Psychiatric  History:  Social History:  Social History   Substance and Sexual Activity  Alcohol Use No     Social History   Substance and Sexual Activity  Drug Use No    Social History   Socioeconomic History  .  Marital status: Single    Spouse name: Not on file  . Number of children: Not on file  . Years of education: Not on file  . Highest education level: Not on file  Occupational History  . Not on file  Social Needs  . Financial resource strain: Not on file  . Food insecurity:    Worry: Not on file    Inability: Not on file  . Transportation needs:    Medical: Not on file    Non-medical: Not on file  Tobacco Use  . Smoking status: Never Smoker  . Smokeless tobacco: Never Used  Substance and Sexual Activity  . Alcohol use: No  . Drug use: No  . Sexual activity: Never  Lifestyle  . Physical activity:    Days per week: Not on file    Minutes per session: Not on file  . Stress: Not on file  Relationships  . Social connections:    Talks on phone: Not on file    Gets together: Not on file    Attends religious service: Not on file    Active member of club or organization: Not on file    Attends meetings of clubs or organizations: Not on file    Relationship status: Not on file  Other Topics Concern  . Not on file  Social History Narrative  . Not on file   Additional Social History:    Allergies:   Allergies  Allergen Reactions  . Sulfamethoxazole Rash    Labs:  Results for orders placed or performed during the hospital encounter of 07/17/18 (from the past 48 hour(s))  Comprehensive metabolic panel     Status: Abnormal   Collection Time: 07/17/18  2:54 PM  Result Value Ref Range   Sodium 142 135 - 145 mmol/L   Potassium 4.3 3.5 - 5.1 mmol/L   Chloride 107 98 - 111 mmol/L   CO2 25 22 - 32 mmol/L   Glucose, Bld 141 (H) 70 - 99 mg/dL   BUN 21 8 - 23 mg/dL   Creatinine, Ser 1.95 (H) 0.61 - 1.24 mg/dL   Calcium 9.2 8.9 - 10.3 mg/dL   Total Protein 6.3 (L) 6.5 - 8.1 g/dL   Albumin 4.1 3.5 - 5.0 g/dL   AST 25 15 - 41 U/L   ALT 18 0 - 44 U/L   Alkaline Phosphatase 92 38 - 126 U/L   Total Bilirubin 0.4 0.3 - 1.2 mg/dL   GFR calc non Af Amer 34 (L) >60 mL/min   GFR calc  Af Amer 40 (L) >60 mL/min    Comment: (NOTE) The eGFR has been calculated using the CKD EPI equation. This calculation has not been validated in all clinical situations. eGFR's persistently <60 mL/min signify possible Chronic Kidney Disease.    Anion gap 10 5 - 15    Comment: Performed at [facility], [place] [address] [place], [place] [address]  CBC with Differential     Status: Abnormal   Collection Time: 07/17/18  2:54 PM  Result Value Ref Range   WBC 17.4 (H) 4.0 - 10.5 K/uL   RBC 4.51 4.22 - 5.81 MIL/uL   Hemoglobin  13.9 13.0 - 17.0 g/dL   HCT 42.1 39.0 - 52.0 %   MCV 93.3 78.0 - 100.0 fL   MCH 30.8 26.0 - 34.0 pg   MCHC 33.0 30.0 - 36.0 g/dL   RDW 14.1 11.5 - 15.5 %   Platelets 160 150 - 400 K/uL   Neutrophils Relative % 96 %   Neutro Abs 16.5 (H) 1.7 - 7.7 K/uL   Lymphocytes Relative 3 %   Lymphs Abs 0.6 (L) 0.7 - 4.0 K/uL   Monocytes Relative 1 %   Monocytes Absolute 0.3 0.1 - 1.0 K/uL   Eosinophils Relative 0 %   Eosinophils Absolute 0.0 0.0 - 0.7 K/uL   Basophils Relative 0 %   Basophils Absolute 0.0 0.0 - 0.1 K/uL    Comment: Performed at [facility], [place] [address] [place], [place] [address]  Protime-INR     Status: None   Collection Time: 07/17/18  2:54 PM  Result Value Ref Range   Prothrombin Time 12.9 11.4 - 15.2 seconds   INR 0.98     Comment: Performed at [facility], [place] [address] [place], [place] [address]  I-Stat CG4 Lactic Acid, ED     Status: Abnormal   Collection Time: 07/17/18  3:03 PM  Result Value Ref Range   Lactic Acid, Venous 2.48 (HH) 0.5 - 1.9 mmol/L   Comment NOTIFIED PHYSICIAN   Urinalysis, Routine w reflex microscopic     Status: Abnormal   Collection Time: 07/17/18  4:26 PM  Result Value Ref Range   Color, Urine YELLOW YELLOW   APPearance CLEAR CLEAR   Specific Gravity, Urine 1.019 1.005 - 1.030   pH 5.0 5.0 - 8.0   Glucose, UA NEGATIVE NEGATIVE mg/dL   Hgb urine dipstick  MODERATE (A) NEGATIVE   Bilirubin Urine NEGATIVE NEGATIVE   Ketones, ur NEGATIVE NEGATIVE mg/dL   Protein, ur NEGATIVE NEGATIVE mg/dL   Nitrite NEGATIVE NEGATIVE   Leukocytes, UA NEGATIVE NEGATIVE   RBC / HPF >50 (H) 0 - 5 RBC/hpf   WBC, UA 0-5 0 - 5 WBC/hpf   Bacteria, UA RARE (A) NONE SEEN   Mucus PRESENT     Comment: Performed at [facility], [place] [address] [place], [place] [address]  Urine culture     Status: None   Collection Time: 07/17/18  4:33 PM  Result Value Ref Range   Specimen Description      URINE, CLEAN CATCH Performed at [facility], [place] [address], [place], [place] [address]    Special Requests      NONE Performed at [facility], [place] [address], [place], [place] [address]    Culture      NO GROWTH Performed at [facility], [place] [address] [place], [place] [address]    Report Status 07/19/2018 FINAL   I-Stat CG4 Lactic Acid, ED     Status: Abnormal   Collection Time: 07/17/18  5:38 PM  Result Value Ref Range   Lactic Acid, Venous 2.14 (HH) 0.5 - 1.9 mmol/L   Comment NOTIFIED PHYSICIAN   Influenza panel by PCR (type A & B)     Status: None   Collection Time: 07/17/18  5:58 PM  Result Value Ref Range   Influenza A By PCR NEGATIVE NEGATIVE   Influenza B By PCR NEGATIVE NEGATIVE    Comment: (NOTE) The Xpert Xpress Flu assay is intended as an aid in the diagnosis of  influenza and should not be used as a sole basis for treatment.  This  assay is FDA approved for nasopharyngeal swab specimens only. Nasal  washings and aspirates are unacceptable for Xpert Xpress Flu testing. Performed at [facility], [place] [address], [place], [place] [address]   MRSA PCR Screening     Status: Abnormal   Collection Time: 07/17/18  7:44 PM  Result Value Ref Range   MRSA by PCR POSITIVE (A) NEGATIVE    Comment:        The GeneXpert MRSA Assay (FDA approved for NASAL specimens only), is one  component of a comprehensive MRSA colonization surveillance program. It is not intended to diagnose MRSA infection nor to guide or monitor treatment for MRSA infections. RESULT CALLED TO, READ BACK BY AND VERIFIED WITH: A DICKEY,RN _0  07/17/18 MKELLY Performed at [facility], [place] [address] [place], [place] [address]   Lactic acid, plasma     Status: Abnormal   Collection Time: 07/17/18 10:40 PM  Result Value Ref Range   Lactic Acid, Venous 3.4 (HH) 0.5 - 1.9 mmol/L    Comment: CRITICAL RESULT CALLED TO, READ BACK BY AND VERIFIED WITH: A DICKEY,RN _1  07/17/18 MKELLY Performed at [facility], [place] [address], [place], [place] [address]   Procalcitonin     Status: None   Collection Time: 07/17/18 10:40 PM  Result Value Ref Range   Procalcitonin 23.72 ng/mL    Comment:        Interpretation: PCT >= 10 ng/mL: Important systemic inflammatory response, almost exclusively due to severe bacterial sepsis or septic shock. (NOTE)       Sepsis PCT Algorithm           Lower Respiratory Tract                                      Infection PCT Algorithm    ----------------------------     ----------------------------         PCT < 0.25 ng/mL                PCT < 0.10 ng/mL         Strongly encourage             Strongly discourage   discontinuation of antibiotics    initiation of antibiotics    ----------------------------     -----------------------------       PCT 0.25 - 0.50 ng/mL            PCT 0.10 - 0.25 ng/mL               OR       >80% decrease in PCT            Discourage initiation of                                            antibiotics      Encourage discontinuation           of antibiotics    ----------------------------     -----------------------------         PCT >= 0.50 ng/mL              PCT 0.26 - 0.50 ng/mL  AND       <80% decrease in PCT             Encourage initiation of                                              antibiotics       Encourage continuation           of antibiotics    ----------------------------     -----------------------------        PCT >= 0.50 ng/mL                  PCT > 0.50 ng/mL               AND         increase in PCT                  Strongly encourage                                      initiation of antibiotics    Strongly encourage escalation           of antibiotics                                     -----------------------------                                           PCT <= 0.25 ng/mL                                                 OR                                        > 80% decrease in PCT                                     Discontinue / Do not initiate                                             antibiotics Performed at [place] [address], [place], [place] [address]   Lactic acid, plasma     Status: Abnormal   Collection Time: 07/18/18  2:27 AM  Result Value Ref Range   Lactic Acid, Venous 2.2 (HH) 0.5 - 1.9 mmol/L    Comment: CRITICAL RESULT CALLED TO, READ BACK BY AND VERIFIED WITH: A DICKEY,RN _0  07/18/18 MKELLY Performed at [facility], [place] [address], [place], [place] [address]   Comprehensive metabolic panel     Status: Abnormal   Collection Time: 07/18/18  2:27 AM  Result Value  Ref Range   Sodium 139 135 - 145 mmol/L   Potassium 4.4 3.5 - 5.1 mmol/L   Chloride 111 98 - 111 mmol/L   CO2 20 (L) 22 - 32 mmol/L   Glucose, Bld 177 (H) 70 - 99 mg/dL   BUN 23 8 - 23 mg/dL   Creatinine, Ser 2.00 (H) 0.61 - 1.24 mg/dL   Calcium 8.4 (L) 8.9 - 10.3 mg/dL   Total Protein 4.8 (L) 6.5 - 8.1 g/dL   Albumin 2.8 (L) 3.5 - 5.0 g/dL   AST 49 (H) 15 - 41 U/L   ALT 30 0 - 44 U/L   Alkaline Phosphatase 62 38 - 126 U/L   Total Bilirubin 0.7 0.3 - 1.2 mg/dL   GFR calc non Af Amer 33 (L) >60 mL/min   GFR calc Af Amer 39 (L) >60 mL/min    Comment: (NOTE) The eGFR has been calculated using the  CKD EPI equation. This calculation has not been validated in all clinical situations. eGFR's persistently <60 mL/min signify possible Chronic Kidney Disease.    Anion gap 8 5 - 15    Comment: Performed at [facility], [place] [address], [place], [place] [address]  CBC     Status: Abnormal   Collection Time: 07/18/18  2:27 AM  Result Value Ref Range   WBC 28.4 (H) 4.0 - 10.5 K/uL   RBC 3.91 (L) 4.22 - 5.81 MIL/uL   Hemoglobin 12.2 (L) 13.0 - 17.0 g/dL   HCT 36.7 (L) 39.0 - 52.0 %   MCV 93.9 78.0 - 100.0 fL   MCH 31.2 26.0 - 34.0 pg   MCHC 33.2 30.0 - 36.0 g/dL   RDW 14.4 11.5 - 15.5 %   Platelets 147 (L) 150 - 400 K/uL    Comment: Performed at [facility], [place] [address], [place], [place] [address]  Magnesium     Status: Abnormal   Collection Time: 07/18/18  2:27 AM  Result Value Ref Range   Magnesium 1.6 (L) 1.7 - 2.4 mg/dL    Comment: Performed at [facility], [place] [address] [place], [place] [address]  CBC with Differential/Platelet     Status: Abnormal   Collection Time: 07/19/18  5:07 AM  Result Value Ref Range   WBC 13.4 (H) 4.0 - 10.5 K/uL   RBC 3.76 (L) 4.22 - 5.81 MIL/uL   Hemoglobin 11.7 (L) 13.0 - 17.0 g/dL   HCT 35.5 (L) 39.0 - 52.0 %   MCV 94.4 78.0 - 100.0 fL   MCH 31.1 26.0 - 34.0 pg   MCHC 33.0 30.0 - 36.0 g/dL   RDW 14.6 11.5 - 15.5 %   Platelets 113 (L) 150 - 400 K/uL    Comment: REPEATED TO VERIFY SPECIMEN CHECKED FOR CLOTS PLATELET COUNT CONFIRMED BY SMEAR    Neutrophils Relative % 87 %   Neutro Abs 11.7 (H) 1.7 - 7.7 K/uL   Lymphocytes Relative 7 %   Lymphs Abs 1.0 0.7 - 4.0 K/uL   Monocytes Relative 5 %   Monocytes Absolute 0.6 0.1 - 1.0 K/uL   Eosinophils Relative 1 %   Eosinophils Absolute 0.2 0.0 - 0.7 K/uL   Basophils Relative 0 %   Basophils Absolute 0.0 0.0 - 0.1 K/uL    Comment: Performed at [facility], [place] [address] [place] [address], [place], [place] [address]  Comprehensive  metabolic panel     Status: Abnormal   Collection Time: 07/19/18  5:07 AM  Result Value Ref Range   Sodium 141 135 - 145 mmol/L   Potassium 4.2 3.5 - 5.1 mmol/L   Chloride 112 (H) 98 - 111 mmol/L   CO2 22 22 - 32 mmol/L   Glucose, Bld 160 (H) 70 - 99 mg/dL   BUN 21 8 - 23 mg/dL   Creatinine, Ser 1.78 (H) 0.61 - 1.24 mg/dL   Calcium 8.6 (L) 8.9 - 10.3 mg/dL   Total Protein 5.1 (L) 6.5 - 8.1 g/dL   Albumin 2.7 (L) 3.5 - 5.0 g/dL   AST 162 (H) 15 - 41 U/L   ALT 180 (H) 0 - 44 U/L   Alkaline Phosphatase 104 38 - 126 U/L   Total Bilirubin 2.2 (H) 0.3 - 1.2 mg/dL   GFR calc non Af Amer 38 (L) >60 mL/min   GFR calc Af Amer 44 (L) >60 mL/min    Comment: (NOTE) The eGFR has been calculated using the CKD EPI equation. This calculation has not been validated in all clinical situations. eGFR's persistently <60 mL/min signify possible Chronic Kidney Disease.    Anion gap 7 5 - 15    Comment: Performed at [facility], [place] [address] [place], [place] [address]  Magnesium     Status: None   Collection Time: 07/19/18  5:07 AM  Result Value Ref Range   Magnesium 2.1 1.7 - 2.4 mg/dL    Comment: Performed at [facility], [place] [address] [place], [place] [address]    Current Facility-Administered Medications  Medication Dose Route Frequency Provider Last Rate Last Dose  . acetaminophen (TYLENOL) tablet 650 mg  650 mg Oral Q6H PRN Aline August, MD   650 mg at 07/18/18 0006   Or  . acetaminophen (TYLENOL) suppository 650 mg  650 mg Rectal Q6H PRN Alekh, Kshitiz, MD      . albuterol (PROVENTIL) (2.5 MG/3ML) 0.083% nebulizer solution 2.5 mg  2.5 mg Inhalation Q4H PRN Alekh, Kshitiz, MD      . aspirin EC tablet 81 mg  81 mg Oral Daily Alekh, Kshitiz, MD   81 mg at 07/19/18 1012  . cefTRIAXone (ROCEPHIN) 2 g in sodium chloride 0.9 % 100 mL IVPB  2 g Intravenous Q24H Aline August, MD   Stopped at 07/19/18 0859  . Chlorhexidine Gluconate Cloth 2 % PADS 6 each   6 each Topical Q0600 Aline August, MD   6 each at 07/18/18 0600  . chlorproMAZINE (THORAZINE) tablet 100 mg  100 mg Oral QHS Starla Link, Kshitiz, MD   100 mg at 07/18/18 2320  . chlorproMAZINE (THORAZINE) tablet 50 mg  50 mg Oral QID Aline August, MD   50 mg at 07/19/18 1055  . cloZAPine (CLOZARIL) tablet 150 mg  150 mg Oral BID Aline August, MD   150 mg at 07/19/18 1013  . enoxaparin (LOVENOX) injection 40 mg  40 mg Subcutaneous Q24H Aline August, MD   40 mg at 07/18/18 2321  . escitalopram (LEXAPRO) tablet 10 mg  10 mg Oral QAC breakfast Aline August, MD   10 mg at 07/19/18 0829  . lamoTRIgine (LAMICTAL) tablet 50 mg  50 mg Oral TID Aline August, MD   50 mg at 07/19/18 1012  . levothyroxine (SYNTHROID, LEVOTHROID) tablet 100 mcg  100 mcg Oral QAC breakfast Aline August, MD   100 mcg at 07/19/18 0829  . mupirocin ointment (BACTROBAN) 2 % 1 application  1 application Nasal BID Aline August, MD   1 application at 83/33/83 1015  .  ondansetron (ZOFRAN) tablet 4 mg  4 mg Oral Q6H PRN Starla Link, Kshitiz, MD       Or  . ondansetron (ZOFRAN) injection 4 mg  4 mg Intravenous Q6H PRN Alekh, Kshitiz, MD      . polyethylene glycol (MIRALAX / GLYCOLAX) packet 17 g  17 g Oral Daily Alekh, Kshitiz, MD   17 g at 07/19/18 1013  . senna (SENOKOT) tablet 8.6 mg  1 tablet Oral Daily Alekh, Kshitiz, MD   8.6 mg at 07/19/18 1013  . traMADol (ULTRAM) tablet 50 mg  50 mg Oral Q6H PRN Starla Link, Kshitiz, MD      . vancomycin (VANCOCIN) 1,250 mg in sodium chloride 0.9 % 250 mL IVPB  1,250 mg Intravenous Q24H Adrian Saran, [facility]   Stopped at 07/18/18 2127    Musculoskeletal: Strength & Muscle Tone: within normal limits Gait & Station: normal Patient leans: N/A  Psychiatric Specialty Exam: Physical Exam  Nursing note and vitals reviewed. Constitutional: He appears well-developed.  Cardiovascular: Normal rate.  Neurological: He is alert.  Psychiatric: He has a normal mood and affect. His behavior is normal.     Review of Systems  Psychiatric/Behavioral: Negative for depression, hallucinations and suicidal ideas.  All other systems reviewed and are negative.   Blood pressure (!) 116/57, pulse 80, temperature 98.6 F (37 C), temperature source Oral, resp. rate 20, height 6' (1.829 m), weight 130.6 kg, SpO2 95 %.Body mass index is 39.05 kg/m.  General Appearance: Guarded  Eye Contact:  Fair  Speech:  Clear and Coherent and Slow  Volume:  Normal  Mood:  Depressed  Affect:  Congruent and Flat  Thought Process:  Coherent  Orientation:  Full (Time, Place, and Person)  Thought Content:  Logical  Suicidal Thoughts:  No  Homicidal Thoughts:  No  Memory:  Immediate;   Fair Remote;   Fair  Judgement:  Fair  Insight:  Fair  Psychomotor Activity:   Concentration:  Concentration: Fair  Recall:  [employer] of Knowledge:  Fair  Language:  Fair  Akathisia:  No  Handed:  AIMS (if indicated):     Assets:  Communication Skills Desire for Improvement Resilience  ADL's:    Cognition:  WNL  Sleep:        Treatment Plan Summary: Daily contact with patient to assess and evaluate symptoms and progress in treatment and Medication management   Schizophrenia:  Consider decreasing Thorazine 50 mg p.o. 4 times daily to  Thorazine 25 mg   p.o. 3 times daily  Continue Thorazine 100 mg p.o. Nightly  Continue Cloraril 150 p.o. twice daily  Continue continue Lamictal 50 mg p.o. 3 times daily Lexapro 10 p.o. Daily EPS  Consider initiating cogentin 0.73m Po BID   Disposition: No evidence of imminent risk to self or others at present.   Patient does not meet criteria for psychiatric inpatient admission. Supportive therapy provided about ongoing stressors. Discussed crisis plan, support from social network, calling 911, coming to the Emergency Department, and calling Suicide Hotline.  [place] NP 07/19/2018 12:38 PM

## 2018-07-19 NOTE — Progress Notes (Signed)
Pt lethargic, sleepy, not able to keep eyes open, barely arousalble. Pt c/o of stiff neck earlier, did not give thorazine as patient was severely sedated and highly difficult to arouse. This RN was concerned about the complain of stiff neck (symtom of EPS) there is no Congentin or benadryl ordered to prevent EPS. Oncall provider asked to advise, received call from provider Baltazar Najjar stating no orders for the pt. Will continue to monitor for other S&S.

## 2018-07-20 ENCOUNTER — Inpatient Hospital Stay (HOSPITAL_COMMUNITY): Payer: Medicare Other

## 2018-07-20 DIAGNOSIS — R609 Edema, unspecified: Secondary | ICD-10-CM

## 2018-07-20 DIAGNOSIS — F209 Schizophrenia, unspecified: Secondary | ICD-10-CM

## 2018-07-20 DIAGNOSIS — L03115 Cellulitis of right lower limb: Secondary | ICD-10-CM

## 2018-07-20 DIAGNOSIS — M7989 Other specified soft tissue disorders: Secondary | ICD-10-CM

## 2018-07-20 DIAGNOSIS — A419 Sepsis, unspecified organism: Principal | ICD-10-CM

## 2018-07-20 DIAGNOSIS — N183 Chronic kidney disease, stage 3 (moderate): Secondary | ICD-10-CM

## 2018-07-20 LAB — COMPREHENSIVE METABOLIC PANEL
ALK PHOS: 147 U/L — AB (ref 38–126)
ALT: 156 U/L — AB (ref 0–44)
ANION GAP: 7 (ref 5–15)
AST: 102 U/L — ABNORMAL HIGH (ref 15–41)
Albumin: 3 g/dL — ABNORMAL LOW (ref 3.5–5.0)
BILIRUBIN TOTAL: 2.5 mg/dL — AB (ref 0.3–1.2)
BUN: 16 mg/dL (ref 8–23)
CALCIUM: 9.4 mg/dL (ref 8.9–10.3)
CO2: 23 mmol/L (ref 22–32)
CREATININE: 1.62 mg/dL — AB (ref 0.61–1.24)
Chloride: 114 mmol/L — ABNORMAL HIGH (ref 98–111)
GFR calc Af Amer: 50 mL/min — ABNORMAL LOW (ref >60)
GFR, EST NON AFRICAN AMERICAN: 43 mL/min — AB (ref >60)
Glucose, Bld: 121 mg/dL — ABNORMAL HIGH (ref 70–99)
Potassium: 4.6 mmol/L (ref 3.5–5.1)
Sodium: 144 mmol/L (ref 135–145)
TOTAL PROTEIN: 5.5 g/dL — AB (ref 6.5–8.1)

## 2018-07-20 LAB — CBC WITH DIFFERENTIAL/PLATELET
Basophils Absolute: 0 K/uL (ref 0.0–0.1)
Basophils Relative: 0 %
Eosinophils Absolute: 0.3 K/uL (ref 0.0–0.7)
Eosinophils Relative: 4 %
HCT: 36.4 % — ABNORMAL LOW (ref 39.0–52.0)
Hemoglobin: 11.9 g/dL — ABNORMAL LOW (ref 13.0–17.0)
Lymphocytes Relative: 14 %
Lymphs Abs: 1 K/uL (ref 0.7–4.0)
MCH: 30.4 pg (ref 26.0–34.0)
MCHC: 32.7 g/dL (ref 30.0–36.0)
MCV: 93.1 fL (ref 78.0–100.0)
Monocytes Absolute: 0.5 K/uL (ref 0.1–1.0)
Monocytes Relative: 6 %
Neutro Abs: 5.8 K/uL (ref 1.7–7.7)
Neutrophils Relative %: 76 %
Platelets: 148 K/uL — ABNORMAL LOW (ref 150–400)
RBC: 3.91 MIL/uL — ABNORMAL LOW (ref 4.22–5.81)
RDW: 14.6 % (ref 11.5–15.5)
WBC: 7.6 K/uL (ref 4.0–10.5)

## 2018-07-20 LAB — MAGNESIUM: Magnesium: 2 mg/dL (ref 1.7–2.4)

## 2018-07-20 MED ORDER — BENZTROPINE MESYLATE 0.5 MG PO TABS: 0.5000 mg | ORAL_TABLET | Freq: Two times a day (BID) | ORAL | Status: DC

## 2018-07-20 MED ORDER — CHLORPROMAZINE HCL 25 MG PO TABS: 50.0000 mg | ORAL_TABLET | Freq: Three times a day (TID) | ORAL | Status: DC

## 2018-07-20 MED ORDER — ACETAMINOPHEN 500 MG PO TABS: 1000.0000 mg | ORAL_TABLET | Freq: Four times a day (QID) | ORAL | 2 refills | Status: AC | PRN

## 2018-07-20 NOTE — NC FL2 (Signed)
[place] LEVEL OF CARE SCREENING TOOL     IDENTIFICATION  Patient Name: [name] Birthdate: September 16, 1951 Sex: male Admission Date (Current Location): 07/17/2018  [facility] and [place] Number:  Herbalist and Address:  [facility],  [place] [address] [place]      Provider Number: [id number]  Attending Physician Name and Address:  Patrecia Pour, Christean Grief, MD  Relative Name and Phone Number:       Current Level of Care: Hospital Recommended Level of Care: Other (Comment)(Group Home) Prior Approval Number:    Date Approved/Denied:   PASRR Number:    Discharge Plan: Other (Comment)(Group Home)    Current Diagnoses: Patient Active Problem List   Diagnosis Date Noted  . Small bowel obstruction ([place]) 02/19/2018  . Cellulitis 12/02/2017  . Severe left ventricular hypertrophy 10/21/2017  . Stage II pressure injury of buttocks 10/03/2017  . Obesity (BMI 30-39.9) 10/03/2017  . Hyperlipidemia 10/03/2017  . Hypothyroidism 10/03/2017  . RBBB 09/17/2017  . Lobar pneumonia ([place]) 02/12/2016  . Cellulitis of right lower extremity 03/28/2015  . Sepsis ([place]) 03/27/2015  . Prolonged QT interval   . CLL (chronic lymphocytic leukemia) ([place]) 09/12/2013  . Non Hodgkin's lymphoma ([place]) 09/12/2013  . Leukocytosis 12/24/2012  . Anemia 12/24/2012  . CKD (chronic kidney disease), stage III ([place]) 12/24/2012  . Schizophrenia ([place]) 12/24/2012    Orientation RESPIRATION BLADDER Height & Weight     Self, Time, Situation, Place  Normal Incontinent Weight: 285 lb 15 oz (129.7 kg) Height:  6' (182.9 cm)  BEHAVIORAL SYMPTOMS/MOOD NEUROLOGICAL BOWEL NUTRITION STATUS        Diet(regular)  AMBULATORY STATUS COMMUNICATION OF NEEDS Skin   Limited Assist Verbally Normal                       Personal Care Assistance Level of Assistance  Bathing, Feeding, Dressing Bathing Assistance: Limited assistance Feeding assistance: Independent Dressing  Assistance: Limited assistance     Functional Limitations Info  Sight, Hearing, Speech Sight Info: Impaired Hearing Info: Adequate Speech Info: Adequate    SPECIAL CARE FACTORS FREQUENCY  PT (By licensed PT)     PT Frequency: HHPT              Contractures      Additional Factors Info  Code Status, Allergies Code Status Info: Full Code Allergies Info: Sulfamethoxazole              Discharge Medications:  Medication List    STOP taking these medications   amoxicillin-clavulanate 875-125 MG tablet Commonly known as:  AUGMENTIN     TAKE these medications   acetaminophen 500 MG tablet Commonly known as:  TYLENOL Take 2 tablets (1,000 mg total) by mouth every 6 (six) hours as needed.   albuterol 108 (90 Base) MCG/ACT inhaler Commonly known as:  PROVENTIL HFA;VENTOLIN HFA Inhale 2 puffs into the lungs every 4 (four) hours as needed for wheezing or shortness of breath.   aspirin EC 81 MG tablet Take 81 mg by mouth daily.   atorvastatin 20 MG tablet Commonly known as:  LIPITOR Take 20 mg by mouth daily.   benztropine 0.5 MG tablet Commonly known as:  COGENTIN Take 1 tablet (0.5 mg total) by mouth 2 (two) times daily.   chlorproMAZINE 100 MG tablet Commonly known as:  THORAZINE Take 100 mg at bedtime by mouth. What changed:  Another medication with the same name was changed. Make sure you  understand how and when to take each.   chlorproMAZINE 25 MG tablet Commonly known as:  THORAZINE Take 2 tablets (50 mg total) by mouth 3 (three) times daily. What changed:    medication strength  when to take this   cloZAPine 100 MG tablet Commonly known as:  CLOZARIL Take 100 mg by mouth 2 (two) times daily.   clozapine 50 MG tablet Commonly known as:  CLOZARIL Take 50 mg by mouth 2 (two) times daily.   docusate sodium 100 MG capsule Commonly known as:  COLACE Take 100 mg by mouth daily.   escitalopram 10 MG tablet Commonly known as:   LEXAPRO Take 10 mg by mouth daily before breakfast.   Fish Oil 1000 MG Caps Take 4,000 mg by mouth daily.   lamoTRIgine 25 MG tablet Commonly known as:  LAMICTAL Take 50 mg by mouth 3 (three) times daily.   levothyroxine 100 MCG tablet Commonly known as:  SYNTHROID, LEVOTHROID Take 100 mcg daily by mouth.   Nystatin Powd Apply 1 application topically daily after breakfast. Apply to buttocks   polyethylene glycol packet Commonly known as:  MIRALAX / GLYCOLAX Take 17 g daily by mouth. Dissolve 17g in 8oz of water/juice and drink by mouth every day   senna 8.6 MG Tabs tablet Commonly known as:  SENOKOT Take 1 tablet by mouth daily.   VITAMIN D-3 PO Take 5,000 mg by mouth daily.         Follow-up Information    [facility] Follow up.   Specialty:  [place] Why:  [facility] physical therapy Contact information: [place] [address] [phone]        [name], [place]. Schedule an appointment as soon as possible for a visit in 1 week(s).   Specialty:  Nurse Practitioner Why:  [place] information: [place] [address] [phone]      Relevant Imaging Results:  Relevant Lab Results:   Additional Information SS#: [id number]. Lives at the [facility] in [place].  Burnis Medin, LCSW

## 2018-07-20 NOTE — Care Management Important Message (Signed)
Important Message  Patient Details  Name: INEZ ROSATO MRN: [id number] Date of Birth: 09-03-1951   Medicare Important Message Given:  Yes    [name] 07/20/2018, 12:37 [place] Message  Patient Details  Name: BILLYJACK TROMPETER MRN: [id number] Date of Birth: May 18, 1951   Medicare Important Message Given:  Yes    [name] 07/20/2018, 12:37 PM

## 2018-07-20 NOTE — Progress Notes (Signed)
*  Preliminary Results* Right lower extremity venous duplex completed.  Right lower extremity is negative for deep vein thrombosis. There is no evidence of right Baker's cyst.  07/20/2018 10:08 AM  [name]

## 2018-07-20 NOTE — Progress Notes (Signed)
Patient returning to [facility] group home. CSW sent requested clinical documents. Facility aware of patient's discharge and confirmed patient's ability to return. PTAR contacted, patient's legal guardian Gildardo Pounds) notified. Patient's RN can call report to [phone], packet complete. CSW signing off, no other needs identified at this time.  Abundio Miu, [place] Social Worker [facility] Cell#: [phone]

## 2018-07-20 NOTE — Care Management Note (Signed)
Case Management Note  Patient Details  Name: [name] MRN: [id number] Date of Birth: 16-Sep-1951  Subjective/Objective: From [facility] Home. [facility] Sh[facility] L[facility] aware of d/c & HHPT order. Per CSW wil transport by PTAR back to group Home. No further CM needs.                   Action/Plan:d/c home w/HHC.   Expected Discharge Date:                  Expected Discharge Plan:  [place]  In-House Referral:     Discharge planning Services  CM Consult  Post Acute Care Choice:  Durable Medical Equipment, Home Health(rw, [facility]) Choice offered to:     DME Arranged:    DME Agency:     HH Arranged:  PT HH Agency:  [facility]  Status of Service:  Completed, signed off  If discussed at [place] of Stay Meetings, dates discussed:    Additional Comments:  Dessa Phi, RN 07/20/2018, 1:08 PM

## 2018-07-20 NOTE — Clinical Social Work Note (Signed)
Clinical Social Work Assessment  Patient Details  Name: [name] MRN: [id number] Date of Birth: 1951/03/18  Date of referral:  07/20/18               Reason for consult:  Discharge Planning                Permission sought to share information with:    Permission granted to share information::     Name::        Agency::     Relationship::     Contact Information:     Housing/Transportation Living arrangements for the past 2 months:  Group Home([facility] [employer] [facility]) Source of Information:  Facility, [facility] [phone])) Patient Interpreter Needed:  None Criminal Activity/Legal Involvement Pertinent to Current Situation/Hospitalization:  No - Comment as needed Significant Relationships:  Other(Comment)(unknown) Lives with:  Facility Resident Do you feel safe going back to the place where you live?  Yes Need for family participation in patient care:  Yes (Comment)  Care giving concerns:  Patient from [facility] group home. Group Home staff member [name] reported that prior to hospitalization was independent with ADLs and uses a walker. Staff reported that patient was [facility]. PT recommending "Home health PT;Supervision/Assistance - 24 hour(if group home unable to provide current level then pt may need SNF)".   Social Worker assessment / plan:  CSW spoke with patient's legal guardian Gildardo Pounds) regarding discharge planning. Patient's legal guardian reported that the plan is for the patient to return to group home via [facility] when medically stable.  CSW contacted [facility] and spoke with staff member [name] regarding PT recommendation and discharge planning. Staff reported that they are able to care for patient in patient's current condition, noting they can assist patient with ambulation, ADLs and provide 24 hour supervision. Staff reported that patient was already receiving home health physical therapy. Staff reported  that they can receive patient via PTAR and that they will need a new FL2.  CSW will complete patient's FL2 and coordinate PTAR when patient is ready for discharge.   Employment status:  Disabled (Comment on whether or not currently receiving Disability) Insurance information:  Medicare PT Recommendations:  Home with [place], 24 Hour Supervision Information / Referral to community resources:  Other (Comment Required)(Patient admitted from [place])  Patient/Family's Response to care:  Patient's legal guardian appreciative of CSW assistance with discharge planning.   Patient/Family's Understanding of and Emotional Response to Diagnosis, Current Treatment, and Prognosis:  Patient's legal guardian involved in patient's care and verbalized understanding of current treatment plan. Patient's legal guardian reported that she spoke with patient's RN at length yesterday regarding patient's medical condition. Patient's legal guardian verbalized plan for patient to return to group home when medically stable.   Emotional Assessment Appearance:  Appears stated age Attitude/Demeanor/Rapport:  Other(Cooperative) Affect (typically observed):  Quiet Orientation:  Oriented to Self, Oriented to Situation, Oriented to Place, Oriented to  Time Alcohol / Substance use:  Not Applicable Psych involvement (Current and /or in the community):  No (Comment)  Discharge Needs  Concerns to be addressed:  Care Coordination Readmission within the last 30 days:  No Current discharge risk:  Physical Impairment Barriers to Discharge:  No Barriers Identified   Burnis Medin, LCSW 07/20/2018, 10:59 AM

## 2018-07-20 NOTE — Progress Notes (Signed)
Physical Therapy Treatment Patient Details Name: [name] MRN: [id number] DOB: 06/27/1951 Today's Date: 07/20/2018    History of Present Illness 66 y.o. male with medical history significant of schizoaffective disorder who lives in a group home, non-Hodgkin's lymphoma, chronic kidney disease stage III, COPD, CHF, hypertension, hyperlipidemia presented from the group home with fever and chills.  He was found to have right lower extremity cellulitis     PT Comments    On arrival, pt sitting EOB Indep eating breakfast.  Flat affect but follows commands/cooperative.  Assisted to bathroom with walker.  Assist to navigate walker around bed and through doorway as appeared "clumbsy" with poor forward flex posture and delayed balance correction.  Assisted in bathroom then amb in hallway.  tolerated distance using walker but required MinGuard Assist for safety negociating around bed and navigating walker in hallway.  Limited distance due to dyspnea.  RA 96% and HR 112.  No c/o R LE.  Pt should be able to return to his Group Home.     Follow Up Recommendations  Home health PT;Supervision/Assistance - 24 hour(at Group Home)     Equipment Recommendations  None recommended by PT    Recommendations for Other Services       Precautions / Restrictions Precautions Precautions: Fall Precaution Comments: wearing a L wrist splint  Restrictions Weight Bearing Restrictions: No    Mobility  Bed Mobility Overal bed mobility: Needs Assistance         Sit to supine: Supervision   General bed mobility comments: increased time  Transfers Overall transfer level: Needs assistance Equipment used: None;Rolling walker (2 wheeled) Transfers: Sit to/from [facility] Sit to Stand: Supervision;Min guard Stand pivot transfers: Supervision;Min guard       General transfer comment: close guard for safety as pt moves "clumbsy" and "slow" but does well to steady self with B  UE's  Ambulation/Gait Ambulation/Gait assistance: Supervision;Min guard Gait Distance (Feet): 85 Feet Assistive device: Rolling walker (2 wheeled) Gait Pattern/deviations: Step-through pattern;Decreased stride length;Trunk flexed;Wide base of support Gait velocity: decreased   General Gait Details: tolerated distance using walker but required MinGuard Assist for safety negociating around bed and navigating walker in hallway.  Limited distance due to dyspnea.  RA 96% and HR 112.     Stairs             Wheelchair Mobility    Modified Rankin (Stroke Patients Only)       Balance                                            Cognition Arousal/Alertness: Awake/alert Behavior During Therapy: WFL for tasks assessed/performed Overall Cognitive Status: No family/caregiver present to determine baseline cognitive functioning                                 General Comments: cooperative, follows commands, wanted to watch [employer] Make a Deal      Exercises      General Comments        Pertinent Vitals/Pain Pain Assessment: Faces Faces Pain Scale: Hurts a little bit Pain Location: R LE Pain Descriptors / Indicators: Aching;Grimacing Pain Intervention(s): Monitored during session;Repositioned    Home Living  Prior Function            PT Goals (current goals can now be found in the care plan section) Progress towards PT goals: Progressing toward goals    Frequency    Min 3X/week      PT Plan Current plan remains appropriate    Co-evaluation              AM-PAC PT "6 Clicks" Daily Activity  Outcome Measure  Difficulty turning over in bed (including adjusting bedclothes, sheets and blankets)?: A Little Difficulty moving from lying on back to sitting on the side of the bed? : A Little Difficulty sitting down on and standing up from a chair with arms (e.g., wheelchair, bedside commode, etc,.)?: A  Little Help needed moving to and from a bed to chair (including a wheelchair)?: A Little Help needed walking in hospital room?: A Little Help needed climbing 3-5 steps with a railing? : A Lot 6 Click Score: 17    End of Session Equipment Utilized During Treatment: Gait belt Activity Tolerance: Patient tolerated treatment well Patient left: in bed;with bed alarm set;with call bell/phone within reach Nurse Communication: Mobility status PT Visit Diagnosis: Difficulty in walking, not elsewhere classified (R26.2)     Time: 1036-1050 PT Time Calculation (min) (ACUTE ONLY): 14 min  Charges:  $Gait Training: 8-22 mins                     Rica Koyanagi  PTA [facility]  Acute  Rehab Pager      [phone]

## 2018-07-20 NOTE — Discharge Summary (Signed)
Physician Discharge Summary  [name]  NWG:[id number]  DOB: Mar 08, 1951  DOA: 07/17/2018 PCP: [name], FNP  Admit date: 07/17/2018 Discharge date: 07/20/2018  Admitted From: Home  Disposition: Home   Recommendations for Outpatient Follow-up:  1. Follow up with PCP in 1 week  2. Please obtain CMP/CBC in one week to monitor renal/liver function and hemoglobin/platelets. 3. Please follow up on the following pending results: Final blood cultures from 07/17/2018  Home Health: PT/OT  Discharge Condition: Stable CODE STATUS: Full code Diet recommendation: Heart Healthy  Brief/Interim Summary: For full details see H&P/Progress note, but in brief, [name] is a 67 y.o.malewith medical history significant ofschizoaffective disorder who lives in a group home, non-Hodgkin's lymphoma, chronic kidney disease stage III, COPD, hypertension, hyperlipidemia presented from the group home with fever and chills.  He was found to have right lower extremity cellulitis and started on broad-spectrum antibiotics.  Subjective: Patient seen and examined, leg erythema significantly improved, still some swelling. Pain much better. No acute events overnight. Remains afebrile.   Discharge Diagnoses/Hospital Course:  Active Problems:   Leukocytosis   CKD (chronic kidney disease), stage III (HCC)   Schizophrenia (HCC)   Sepsis (HCC)   Cellulitis of right lower extremity   Hyperlipidemia   Hypothyroidism  Sepsis secondary to cellulitis Sepsis physiology has resolved, patient was treated with empiric IV antibiotic with Rocephin and vancomycin. Blood cultures negative so far.  Right lower extremity cellulitis Continues to improve clinically, patient initially treated with IV Rocephin and vancomycin.  MRSA PCR was positive.  Patient tolerated oral diet well and remains afebrile.  Will discharge patient on doxycycline to complete 7 days. Follow up with PCP. Doppler US was negative for dvt.    AKI on CKD stage III Felt to be related to hypoperfusion from sepsis.  Treated with IV fluids and creatinine improved almost back to baseline.  Follow-up BMP in 1 week.  Avoid nephrotoxic agents  Elevated LFTs Felt to be secondary to sepsis, LFTs trending down.  Denies right upper quadrant pain.  CT of the abdomen did not show abnormalities in liver or gallbladder.  Monitor as an outpatient.  Thrombocytopenia Felt to be secondary to sepsis, no signs of overt bleeding.  Platelets improving.  Check labs in 1 week.  Microscopic hematuria UA is negative for UTI, CT abdomen show ?  Cystitis, no stones.  Continue to monitor.  Patient was treated with broad-spectrum antibiotics.  Follow-up with PCP.  May need urological evaluation as an outpatient.  Schizophrenia  Psychiatry was consulted and recommended the following changes: Decrease Thorazine 50 mg p.o. 4 times daily to  Thorazine 25 mg p.o. 3 times daily Continue Thorazine 100 mg p.o. Nightly Continue Cloraril 150 p.o. twice daily Continue continue Lamictal 50 mg p.o. 3 times daily Lexapro 10 p.o. Daily EPS - Initiated cogentin 0.5mg  Po BID  All other chronic medical condition were stable during the hospitalization.  Patient was seen by physical therapy, recommending home health PT On the day of the discharge the patient's vitals were stable, and no other acute medical condition were reported by patient. the patient was felt safe to be discharge to home  Discharge Instructions  You were cared for by a hospitalist during your hospital stay. If you have any questions about your discharge medications or the care you received while you were in the hospital after you are discharged, you can call the unit and asked to speak with the hospitalist on call if the hospitalist that  took care of you is not available. Once you are discharged, your primary care physician will handle any further medical issues. Please note that NO REFILLS for any  discharge medications will be authorized once you are discharged, as it is imperative that you return to your primary care physician (or establish a relationship with a primary care physician if you do not have one) for your aftercare needs so that they can reassess your need for medications and monitor your lab values.  Discharge Instructions    Call MD for:  difficulty breathing, headache or visual disturbances   Complete by:  As directed    Call MD for:  extreme fatigue   Complete by:  As directed    Call MD for:  hives   Complete by:  As directed    Call MD for:  persistant dizziness or light-headedness   Complete by:  As directed    Call MD for:  persistant nausea and vomiting   Complete by:  As directed    Call MD for:  redness, tenderness, or signs of infection (pain, swelling, redness, odor or green/yellow discharge around incision site)   Complete by:  As directed    Call MD for:  severe uncontrolled pain   Complete by:  As directed    Call MD for:  temperature >100.4   Complete by:  As directed    Diet - low sodium heart healthy   Complete by:  As directed    Increase activity slowly   Complete by:  As directed      Allergies as of 07/20/2018      Reactions   Sulfamethoxazole Rash      Medication List    STOP taking these medications   amoxicillin-clavulanate 875-125 MG tablet Commonly known as:  AUGMENTIN     TAKE these medications   acetaminophen 500 MG tablet Commonly known as:  TYLENOL Take 2 tablets (1,000 mg total) by mouth every 6 (six) hours as needed.   albuterol 108 (90 Base) MCG/ACT inhaler Commonly known as:  PROVENTIL HFA;VENTOLIN HFA Inhale 2 puffs into the lungs every 4 (four) hours as needed for wheezing or shortness of breath.   aspirin EC 81 MG tablet Take 81 mg by mouth daily.   atorvastatin 20 MG tablet Commonly known as:  LIPITOR Take 20 mg by mouth daily.   benztropine 0.5 MG tablet Commonly known as:  COGENTIN Take 1 tablet (0.5 mg  total) by mouth 2 (two) times daily.   chlorproMAZINE 100 MG tablet Commonly known as:  THORAZINE Take 100 mg at bedtime by mouth. What changed:  Another medication with the same name was changed. Make sure you understand how and when to take each.   chlorproMAZINE 25 MG tablet Commonly known as:  THORAZINE Take 2 tablets (50 mg total) by mouth 3 (three) times daily. What changed:    medication strength  when to take this   cloZAPine 100 MG tablet Commonly known as:  CLOZARIL Take 100 mg by mouth 2 (two) times daily.   clozapine 50 MG tablet Commonly known as:  CLOZARIL Take 50 mg by mouth 2 (two) times daily.   docusate sodium 100 MG capsule Commonly known as:  COLACE Take 100 mg by mouth daily.   escitalopram 10 MG tablet Commonly known as:  LEXAPRO Take 10 mg by mouth daily before breakfast.   Fish Oil 1000 MG Caps Take 4,000 mg by mouth daily.   lamoTRIgine 25 MG tablet Commonly known  as:  LAMICTAL Take 50 mg by mouth 3 (three) times daily.   levothyroxine 100 MCG tablet Commonly known as:  SYNTHROID, LEVOTHROID Take 100 mcg daily by mouth.   Nystatin Powd Apply 1 application topically daily after breakfast. Apply to buttocks   polyethylene glycol packet Commonly known as:  MIRALAX / GLYCOLAX Take 17 g daily by mouth. Dissolve 17g in 8oz of water/juice and drink by mouth every day   senna 8.6 MG Tabs tablet Commonly known as:  SENOKOT Take 1 tablet by mouth daily.   VITAMIN D-3 PO Take 5,000 mg by mouth daily.      Follow-up Information    [facility] Follow up.   Specialty:  [place] Why:  [facility] physical therapy Contact information: [place] [address] [phone]        [name], [place]. Schedule an appointment as soon as possible for a visit in 1 week(s).   Specialty:  Nurse Practitioner Why:  HOSPITAL FOLLOW UP  Contact information: [place] [address] [phone]           Allergies  Allergen Reactions  . Sulfamethoxazole Rash    Consultations:  Psychiatry   Procedures/Studies: Dg Chest 2 View  Result Date: 07/17/2018 CLINICAL DATA:  Onset shaking and chills today. EXAM: CHEST - 2 VIEW COMPARISON:  PA and lateral chest 12/02/2017 and 10/12/2017. FINDINGS: Lung volumes are low with mild left basilar atelectasis. No consolidative process, pneumothorax or effusion. Heart size is normal. No acute or focal bony abnormality. IMPRESSION: No acute finding in a low volume chest. Electronically Signed   By: Inge Rise M.D.   On: 07/17/2018 15:24   Ct Renal Stone Study  Result Date: 07/17/2018 CLINICAL DATA:  67 year old male with history of chills and shaking. Tachycardia. Febrile. EXAM: CT ABDOMEN AND PELVIS WITHOUT CONTRAST TECHNIQUE: Multidetector CT imaging of the abdomen and pelvis was performed following the standard protocol without IV contrast. COMPARISON:  CT the abdomen and pelvis 02/19/2018. FINDINGS: Lower chest: Trace bilateral pleural effusions lying dependently. Linear scarring in the left lower lobe, similar to the prior study. Hepatobiliary: No suspicious cystic or solid hepatic lesions are confidently identified on today's noncontrast CT examination. Unenhanced appearance of the gallbladder is normal. Pancreas: No definite pancreatic mass or peripancreatic fluid or inflammatory changes are noted on today's noncontrast CT examination. Spleen: Unremarkable. Adrenals/Urinary Tract: Unenhanced appearance of the kidneys and bilateral adrenal glands is normal. No hydroureteronephrosis. Urinary bladder is nearly decompressed, but the urinary bladder wall appears potentially thickened. Stomach/Bowel: Unenhanced appearance of the stomach is normal. No pathologic dilatation of small bowel or colon. Normal appendix. Vascular/Lymphatic: Aortic atherosclerosis. No lymphadenopathy noted in the abdomen or pelvis. Reproductive: Prostate gland and seminal vesicles  are unremarkable in appearance. Other: No significant volume of ascites.  No pneumoperitoneum. Musculoskeletal: There are no aggressive appearing lytic or blastic lesions noted in the visualized portions of the skeleton. IMPRESSION: 1. No definite acute findings are noted in the abdomen or pelvis to account for the patient's symptoms. 2. However, there is potential bladder wall thickening. Clinical correlation for signs and symptoms of cystitis is suggested. 3. In addition, there are trace bilateral pleural effusions lying dependently. 4. Aortic atherosclerosis. 5. Normal appendix. Electronically Signed   By: Vinnie Langton M.D.   On: 07/17/2018 17:16    Discharge Exam: Vitals:   07/20/18 0601 07/20/18 1301  BP: (!) 149/58 126/74  Pulse: (!) 49 88  Resp: 14  Temp: 97.7 F (36.5 C) (!) 97.5 F (36.4 C)  SpO2: 98% 97%   Vitals:   07/20/18 0550 07/20/18 0550 07/20/18 0601 07/20/18 1301  BP:  114/69 (!) 149/58 126/74  Pulse:  90 (!) 49 88  Resp:  18 14   Temp:  97.8 F (36.6 C) 97.7 F (36.5 C) (!) 97.5 F (36.4 C)  TempSrc:  Oral Oral Oral  SpO2:  94% 98% 97%  Weight: 129.7 kg     Height:        General: NAD  Cardiovascular: RRR, S1/S2 +, no rubs, no gallops Respiratory: CTA bilaterally, no wheezing, no rhonchi Abdominal: Soft, NT, ND, bowel sounds + Extremities: R L extremity edema improving, erythema almost resolved.   The results of significant diagnostics from this hospitalization (including imaging, microbiology, ancillary and laboratory) are listed below for reference.     Microbiology: Recent Results (from the past 240 hour(s))  Culture, blood (Routine x 2)     Status: None (Preliminary result)   Collection Time: 07/17/18  2:55 PM  Result Value Ref Range Status   Specimen Description   Final    BLOOD RIGHT ARM Performed at [facility], [place] [address] [place], [place] [address]    Special Requests   Final    BAA BCHV Performed at [facility], [place] [address] [place], [place] [address]    Culture   Final    NO GROWTH 2 DAYS Performed at [place] [address], [place], [place] [address]    Report Status PENDING  Incomplete  Culture, blood (Routine x 2)     Status: None (Preliminary result)   Collection Time: 07/17/18  3:06 PM  Result Value Ref Range Status   Specimen Description   Final    BLOOD RIGHT WRIST Performed at [facility], [place] [address], [place], [place] [address]    Special Requests   Final    AEB BCLV Performed at [place] [address] [place], [place] [address]    Culture   Final    NO GROWTH 2 DAYS Performed at [place] [address] [place], [place] [address]    Report Status PENDING  Incomplete  Urine culture     Status: None   Collection Time: 07/17/18  4:33 PM  Result Value Ref Range Status   Specimen Description   Final    URINE, CLEAN CATCH Performed at [facility], [place] [address] [place], [place] [address]    Special Requests   Final    NONE Performed at [facility], [place] [address], [place], [place] [address]    Culture   Final    NO GROWTH Performed at [facility], [place] [address], [place], [place] [address]    Report Status 07/19/2018 FINAL  Final  MRSA PCR Screening     Status: Abnormal   Collection Time: 07/17/18  7:44 PM  Result Value Ref Range Status   MRSA by PCR POSITIVE (A) NEGATIVE Final    Comment:        The GeneXpert MRSA Assay (FDA approved for NASAL specimens only), is one component of a comprehensive MRSA colonization surveillance program. It is not intended to diagnose MRSA infection nor to guide or monitor treatment for MRSA infections. RESULT CALLED TO, READ BACK BY AND VERIFIED WITH: A DICKEY,RN @2204  07/17/18 MKELLY Performed at [facility], [place]  [address], [place], [place] [address]       Labs: BNP (last 3 results) No results for input(s): BNP in the last 8760 hours. Basic Metabolic Panel: Recent Labs  Lab 07/17/18 1454 07/18/18 0227 07/19/18 0507 07/20/18 0818  NA 142 139 141 144  K 4.3 4.4 4.2 4.6  CL 107 111 112* 114*  CO2 25 20* 22 23  GLUCOSE 141* 177* 160* 121*  BUN 21 23 21 16   CREATININE 1.95* 2.00* 1.78* 1.62*  CALCIUM 9.2 8.4* 8.6* 9.4  MG  --  1.6* 2.1 2.0   Liver Function Tests: Recent Labs  Lab 07/17/18 1454 07/18/18 0227 07/19/18 0507 07/20/18 0818  AST 25 49* 162* 102*  ALT 18 30 180* 156*  ALKPHOS 92 62 104 147*  BILITOT 0.4 0.7 2.2* 2.5*  PROT 6.3* 4.8* 5.1* 5.5*  ALBUMIN 4.1 2.8* 2.7* 3.0*   No results for input(s): LIPASE, AMYLASE in the last 168 hours. No results for input(s): AMMONIA in the last 168 hours. CBC: Recent Labs  Lab 07/17/18 1454 07/18/18 0227 07/19/18 0507 07/20/18 0818  WBC 17.4* 28.4* 13.4* 7.6  NEUTROABS 16.5*  --  11.7* 5.8  HGB 13.9 12.2* 11.7* 11.9*  HCT 42.1 36.7* 35.5* 36.4*  MCV 93.3 93.9 94.4 93.1  PLT 160 147* 113* 148*   Cardiac Enzymes: No results for input(s): CKTOTAL, CKMB, CKMBINDEX, TROPONINI in the last 168 hours. BNP: Invalid input(s): POCBNP CBG: No results for input(s): GLUCAP in the last 168 hours. D-Dimer No results for input(s): DDIMER in the last 72 hours. Hgb A1c No results for input(s): HGBA1C in the last 72 hours. Lipid Profile No results for input(s): CHOL, HDL, LDLCALC, TRIG, CHOLHDL, LDLDIRECT in the last 72 hours. Thyroid function studies No results for input(s): TSH, T4TOTAL, T3FREE, THYROIDAB in the last 72 hours.  Invalid input(s): FREET3 Anemia work up No results for input(s): VITAMINB12, FOLATE, FERRITIN, TIBC, IRON, RETICCTPCT in the last 72 hours. Urinalysis    Component Value Date/Time   COLORURINE YELLOW 07/17/2018 1626   APPEARANCEUR CLEAR 07/17/2018 1626   LABSPEC 1.019 07/17/2018 1626   PHURINE 5.0 07/17/2018 1626   GLUCOSEU NEGATIVE 07/17/2018  1626   HGBUR MODERATE (A) 07/17/2018 1626   BILIRUBINUR NEGATIVE 07/17/2018 1626   KETONESUR NEGATIVE 07/17/2018 1626   PROTEINUR NEGATIVE 07/17/2018 1626   UROBILINOGEN 0.2 05/09/2015 1740   NITRITE NEGATIVE 07/17/2018 1626   LEUKOCYTESUR NEGATIVE 07/17/2018 1626   Sepsis Labs Invalid input(s): PROCALCITONIN,  WBC,  LACTICIDVEN Microbiology Recent Results (from the past 240 hour(s))  Culture, blood (Routine x 2)     Status: None (Preliminary result)   Collection Time: 07/17/18  2:55 PM  Result Value Ref Range Status   Specimen Description   Final    BLOOD RIGHT ARM Performed at [facility], [place] [address], [place], [place] [address]    Special Requests   Final    BAA BCHV Performed at [facility], [place] [address] [place], [place] [address]    Culture   Final    NO GROWTH 2 DAYS Performed at [place] [address] [place], [place] [address]    Report Status PENDING  Incomplete  Culture, blood (Routine x 2)     Status: None (Preliminary result)   Collection Time: 07/17/18  3:06 PM  Result Value Ref Range Status   Specimen Description   Final    BLOOD RIGHT WRIST Performed at [facility], [place] [address]  [place], [place] [address]    Special Requests   Final    AEB BCLV Performed at [place] [address] [place], [place] [address]    Culture   Final    NO GROWTH 2 DAYS Performed at [place] [address] [place], [place] [address]    Report Status PENDING  Incomplete  Urine culture     Status: None   Collection Time: 07/17/18  4:33 PM  Result Value Ref Range Status   Specimen Description   Final    URINE, CLEAN CATCH Performed at [facility], [place] [address] [place], [place] [address]    Special Requests   Final    NONE Performed at [facility], [place] [address] [place], [place] [address]    Culture   Final    NO  GROWTH Performed at [facility], [place] [address] [place], [place] [address]    Report Status 07/19/2018 FINAL  Final  MRSA PCR Screening     Status: Abnormal   Collection Time: 07/17/18  7:44 PM  Result Value Ref Range Status   MRSA by PCR POSITIVE (A) NEGATIVE Final    Comment:        The GeneXpert MRSA Assay (FDA approved for NASAL specimens only), is one component of a comprehensive MRSA colonization surveillance program. It is not intended to diagnose MRSA infection nor to guide or monitor treatment for MRSA infections. RESULT CALLED TO, READ BACK BY AND VERIFIED WITH: A DICKEY,RN @2204  07/17/18 MKELLY Performed at [facility], [place] [address] [place],  [address]     Time coordinating discharge: 32 minutes  SIGNED:  Chipper Oman, MD  [facility] 07/20/2018, 1:28 PM  Pager please text page via  [email or web address]  Note - This record has been created using [employer]. Chart creation errors have been sought, but may not always have been located. Such creation errors do not reflect on the standard of medical care.

## 2018-07-20 NOTE — Progress Notes (Signed)
Discharge report called to [name] at [facility]. He has copy of Discharge Summary and new FL-2. Pt transported via [facility]. Condition stable. Eulas Post, RN

## 2018-07-23 LAB — CULTURE, BLOOD (ROUTINE X 2)
CULTURE: NO GROWTH
CULTURE: NO GROWTH

## 2018-08-04 ENCOUNTER — Encounter: Payer: Self-pay | Admitting: Cardiovascular Disease

## 2018-08-04 ENCOUNTER — Ambulatory Visit (INDEPENDENT_AMBULATORY_CARE_PROVIDER_SITE_OTHER): Payer: Medicare Other | Admitting: Cardiovascular Disease

## 2018-08-04 VITALS — BP 109/64 | HR 108 | Ht 73.0 in | Wt 266.8 lb

## 2018-08-04 DIAGNOSIS — R0609 Other forms of dyspnea: Secondary | ICD-10-CM

## 2018-08-04 DIAGNOSIS — R0602 Shortness of breath: Secondary | ICD-10-CM

## 2018-08-04 DIAGNOSIS — M7989 Other specified soft tissue disorders: Secondary | ICD-10-CM

## 2018-08-04 DIAGNOSIS — R9431 Abnormal electrocardiogram [ECG] [EKG]: Secondary | ICD-10-CM

## 2018-08-04 DIAGNOSIS — R400 Somnolence: Secondary | ICD-10-CM | POA: Diagnosis not present

## 2018-08-04 NOTE — Progress Notes (Signed)
Cardiology Office Note   Date:  08/04/2018   ID:  [name], DOB 06/30/1951, MRN [id number]  PCP:  [name], [place]  Cardiologist:   Skeet Latch, MD   Chief Complaint  Patient presents with  . Follow-up     History of Present Illness: [name] is a 67 y.o. male with hypertension, hyperlipidemia, long QT, CKD III, CLL, and schizophrenia who presents for follow up.  He was initially seen 09/14/17 for an evaluation of abnormal EKG.  [name] had a routine EKG for evaluation prior to taking antipsychotic medication.  He was noted to have a right bundle branch block and was referred to cardiology for evaluation.  He was treated for pneumonia 02/2017.  He does not provide much history but is accompanied by his guardian.  She notes that he has increasing exertional dyspnea.  He was referred for an echo 10/06/17 that showed LVEF 65-70% with severe LVH and grade 1 diastolic dysfunction.  There was no evidence of left ventricular outflow tract obstruction or volume overload.  He also had a V/Q scan that was negative for pulmonary embolism.   [name] was admitted to the hospital 09/2016 with pneumonia, cellulitis and sepsis.  After discharge he continued to be short of breath with exertion, Though his guardian mentions that he does not exert himself much anymore.  Basically sits at the group home all day long and does not do anything.  She is concerned that he needs a higher level of care.  He does not get much attention at the group home and they do not force him to exercise.    Since his last appointment [name] was hospitalized 07/2018 with right lower extremity cellulitis.  He had right lower extremity Dopplers that were negative for DVT.  His caregiver notes that he has been extremely short of breath.  It seems to be getting progressively worse.  Even when walking for short distances on flat land he has to stop and rest.  He has no shortness of breath when sitting.  He  does report snoring and is unsure whether he has apneic episodes.  He feels very tired in the morning and frequently goes back to bed right after breakfast.  He has no exertional chest pain.  He has persistent right lower extremity edema that is somewhat better.  He denies orthopnea or PND.    Past Medical History:  Diagnosis Date  . Cellulitis   . CHF (congestive heart failure) ([place])   . Chronic kidney disease 09/20/2008  . Chronic lymphocytic leukemia ([place])   . cll dx'd 10/2003   no rx  . CLL (chronic lymphoblastic leukemia) 10/2003  . COPD (chronic obstructive pulmonary disease) ([place])   . GERD (gastroesophageal reflux disease)   . Hiatal hernia   . Hyperlipemia   . Hypertension   . Lower extremity edema   . NHL (non-Hodgkin's lymphoma) ([place]) dx'd 08/2008 rt groin   chemo comp 08/2008  . Non Hodgkin's lymphoma ([place]) 08/2008  . Non Hodgkin's lymphoma ([place])   . Paranoid schizophrenia ([place])   . Prolonged Q-T interval on ECG   . RBBB 09/17/2017  . Schizoaffective disorder ([place])   . Sepsis ([place])   . Severe left ventricular hypertrophy 10/21/2017  . Vitamin D deficiency     Past Surgical History:  Procedure Laterality Date  . LYMPH NODE BIOPSY       Current Outpatient Medications  Medication Sig Dispense Refill  . acetaminophen (TYLENOL)  500 MG tablet Take 2 tablets (1,000 mg total) by mouth every 6 (six) hours as needed. 100 tablet 2  . albuterol (PROVENTIL HFA;VENTOLIN HFA) 108 (90 Base) MCG/ACT inhaler Inhale 2 puffs into the lungs every 4 (four) hours as needed for wheezing or shortness of breath.    Marland Kitchen aspirin EC 81 MG tablet Take 81 mg by mouth daily.    Marland Kitchen atorvastatin (LIPITOR) 20 MG tablet Take 20 mg by mouth daily.    . benztropine (COGENTIN) 0.5 MG tablet Take 1 tablet (0.5 mg total) by mouth 2 (two) times daily.    . chlorproMAZINE (THORAZINE) 100 MG tablet Take 100 mg at bedtime by mouth.     . chlorproMAZINE (THORAZINE) 25 MG tablet Take 2 tablets (50 mg total) by  mouth 3 (three) times daily.    . Cholecalciferol (VITAMIN D-3 PO) Take 5,000 mg by mouth daily.    . cloZAPine (CLOZARIL) 100 MG tablet Take 100 mg by mouth 2 (two) times daily.     . clozapine (CLOZARIL) 50 MG tablet Take 50 mg by mouth 2 (two) times daily.     Marland Kitchen docusate sodium (COLACE) 100 MG capsule Take 100 mg by mouth daily.    Marland Kitchen escitalopram (LEXAPRO) 10 MG tablet Take 10 mg by mouth daily before breakfast.     . lamoTRIgine (LAMICTAL) 25 MG tablet Take 50 mg by mouth 3 (three) times daily.     Marland Kitchen levothyroxine (SYNTHROID, LEVOTHROID) 100 MCG tablet Take 100 mcg daily by mouth.     . Nystatin POWD Apply 1 application topically daily after breakfast. Apply to buttocks    . Omega-3 Fatty Acids (FISH OIL) 1000 MG CAPS Take 4,000 mg by mouth daily.    . polyethylene glycol (MIRALAX / GLYCOLAX) packet Take 17 g daily by mouth. Dissolve 17g in 8oz of water/juice and drink by mouth every day    . senna (SENOKOT) 8.6 MG TABS tablet Take 1 tablet by mouth daily.     No current facility-administered medications for this visit.     Allergies:   Sulfamethoxazole    Social History:  The patient  reports that he has never smoked. He has never used smokeless tobacco. He reports that he does not drink alcohol or use drugs.   Family History:  The patient's family history includes Alcohol abuse in his father.    ROS:  Please see the history of present illness.   Otherwise, review of systems are positive for none.   All other systems are reviewed and negative.    PHYSICAL EXAM: VS:  BP 109/64   Pulse (!) 108   Ht 6\' 1"  (1.854 m)   Wt 266 lb 12.8 oz (121 kg)   BMI 35.20 kg/m  , BMI Body mass index is 35.2 kg/m. GENERAL:  Well appearing HEENT: Pupils equal round and reactive, fundi not visualized, oral mucosa unremarkable NECK:  No jugular venous distention, waveform within normal limits, carotid upstroke brisk and symmetric, no bruits, no thyromegaly LYMPHATICS:  No cervical  adenopathy LUNGS:  Clear to auscultation bilaterally HEART:  Tachycardic.  Regular rhythm.  PMI not displaced or sustained,S1 and S2 within normal limits, no S3, no S4, no clicks, no rubs, no murmurs ABD:  Flat, positive bowel sounds normal in frequency in pitch, no bruits, no rebound, no guarding, no midline pulsatile mass, no hepatomegaly, no splenomegaly EXT:  2 plus pulses throughout, 1+ R LE edema, no cyanosis no clubbing SKIN:  No rashes no nodules NEURO:  Cranial nerves II through XII grossly intact, motor grossly intact throughout PSYCH:  Oriented to person place and time.  Tangential and unable to answer complex questions accurately.   EKG:  EKG is not ordered today. The ekg ordered 09/14/17 demonstrates sinus tachycardia.  Rate 104 bpm.  LAFB.  RBBB.  QTc 504 ms.   07/17/18: Sinus tachycardia.  Rate 119 bpm.  RBBB.  QTc 542 ms.  Echo 10/06/17: Study Conclusions  - Left ventricle: The cavity size was normal. There was severe   concentric hypertrophy. Systolic function was vigorous. The   estimated ejection fraction was in the range of 65% to 70%. Wall   motion was normal; there were no regional wall motion   abnormalities. Doppler parameters are consistent with abnormal   left ventricular relaxation (grade 1 diastolic dysfunction). The   E/e&' ratio is <8, suggesting normal LV filling pressure. - Left atrium: The atrium was normal in size. - Inferior vena cava: The vessel was normal in size. The   respirophasic diameter changes were in the normal range (>= 50%),   consistent with normal central venous pressure.  Impressions:  - LVEF 65-70%, severe concentric LVH, normal wall motion, grade 1   DD, normal LV filling pressure, normal LA size, normal IVC.   Recent Labs: 09/14/2017: NT-Pro BNP 57 12/03/2017: TSH 1.212 07/20/2018: ALT 156; BUN 16; Creatinine, Ser 1.62; Hemoglobin 11.9; Magnesium 2.0; Platelets 148; Potassium 4.6; Sodium 144   03/02/17: Sodium 144, potassium  4.1, BUN 23, creatinine 1.89   Lipid Panel    Component Value Date/Time   CHOL  11/13/2009 0655    158        ATP III CLASSIFICATION:  <200     mg/dL   Desirable  200-239  mg/dL   Borderline High  >=240    mg/dL   High          TRIG 267 (H) 11/13/2009 0655   HDL 38 (L) 11/13/2009 0655   CHOLHDL 4.2 11/13/2009 0655   VLDL 53 (H) 11/13/2009 0655   LDLCALC  11/13/2009 0655    67        Total Cholesterol/HDL:CHD Risk Coronary Heart Disease Risk Table                     Men   Women  1/2 Average Risk   3.4   3.3  Average Risk       5.0   4.4  2 X Average Risk   9.6   7.1  3 X Average Risk  23.4   11.0        Use the calculated Patient Ratio above and the CHD Risk Table to determine the patient's CHD Risk.        ATP III CLASSIFICATION (LDL):  <100     mg/dL   Optimal  100-129  mg/dL   Near or Above                    Optimal  130-159  mg/dL   Borderline  160-189  mg/dL   High  >190     mg/dL   Very High      Wt Readings from Last 3 Encounters:  08/04/18 266 lb 12.8 oz (121 kg)  07/20/18 285 lb 15 oz (129.7 kg)  02/19/18 271 lb 2.7 oz (123 kg)      ASSESSMENT AND PLAN:  # Shortness of breath: [name] breathing is getting progressively worse.  I suspect  that this is due to deconditioning as he does not exercise at all.  However he hasn't had an ischemia evaluation in 4 years.  Check Lexiscan Myoview.  He also has severe LVH which could be cause an increased outflow track gradient with exertion, though I don't hear a murmur on exam.  We will also evaluate for OSA given his body habitus, snoring, and daytime somnolence.   # QTc Prolongation: # RBBB: QTc stable. This is likely due to several of his antipsychotic medications (chlorpromazine, clozapine, and lexapro).  Avoid adding any additional QT prolonging medications.  RBBB is chronic and not clinically significant.    # Severe LVH: Cause unknown.  He does not have hypertension. There was no evidence of LVOT  obstruction.  He is not a candidate for cardiac MRI due to renal dysfunction.  This could represent hypertrophic cardiomyopathy.  Consider PYP study for detection of amyloid.      Current medicines are reviewed at length with the patient today.  The patient does not have concerns regarding medicines.  The following changes have been made:  no change  Labs/ tests ordered today include:   Orders Placed This Encounter  Procedures  . MYOCARDIAL PERFUSION IMAGING  . ECHOCARDIOGRAM COMPLETE  . Split night study     Disposition:   FU with Myla Mauriello C. Oval Linsey, MD, [facility] in 6 weeks.   This note was written with the assistance of speech recognition software.  Please excuse any transcriptional errors.  Signed, Jaylanni Eltringham C. Oval Linsey, MD, [facility]  08/04/2018 12:27 PM    [facility]

## 2018-08-04 NOTE — Patient Instructions (Signed)
Medication Instructions:  Your physician recommends that you continue on your current medications as directed. Please refer to the Current Medication list given to you today.  Labwork: NONE  Testing/Procedures: Your physician has recommended that you have a sleep study. This test records several body functions during sleep, including: brain activity, eye movement, oxygen and carbon dioxide blood levels, heart rate and rhythm, breathing rate and rhythm, the flow of air through your mouth and nose, snoring, body muscle movements, and chest and belly movement. ONCE YOUR INSURANCE HAS APPROVED THE OFFICE WILL CALL YOU TO SCHEDULE  Your physician has requested that you have an echocardiogram. Echocardiography is a painless test that uses sound waves to create images of your heart. It provides your doctor with information about the size and shape of your heart and how well your heart's chambers and valves are working. This procedure takes approximately one hour. There are no restrictions for this procedure. [place] [address]  Your physician has requested that you have a lexiscan myoview. For further information please visit [email or web address]. Please follow instruction sheet, as given.  Follow-Up: Your physician recommends that you schedule a follow-up appointment in: 6 WEEKS   If you need a refill on your cardiac medications before your next appointment, please call your pharmacy.

## 2018-08-09 ENCOUNTER — Telehealth (HOSPITAL_COMMUNITY): Payer: Self-pay

## 2018-08-09 NOTE — Telephone Encounter (Signed)
Encounter complete. 

## 2018-08-10 ENCOUNTER — Other Ambulatory Visit (HOSPITAL_COMMUNITY): Payer: Self-pay

## 2018-08-11 ENCOUNTER — Ambulatory Visit (HOSPITAL_COMMUNITY)
Admission: RE | Admit: 2018-08-11 | Discharge: 2018-08-11 | Disposition: A | Discharge status: Home / Self Care | Payer: Medicare Other | Source: Ambulatory Visit | Attending: Cardiology | Admitting: Cardiology

## 2018-08-11 DIAGNOSIS — R0609 Other forms of dyspnea: Secondary | ICD-10-CM | POA: Insufficient documentation

## 2018-08-11 LAB — MYOCARDIAL PERFUSION IMAGING
CHL CUP NUCLEAR SRS: 4
CSEPPHR: 98 {beats}/min
LV dias vol: 71 mL (ref 62–150)
LV sys vol: 20 mL
Rest HR: 89 {beats}/min
SDS: 0
SSS: 4
TID: 1.54

## 2018-08-11 MED ORDER — TECHNETIUM TC 99M TETROFOSMIN IV KIT
10.8000 | PACK | Freq: Once | INTRAVENOUS | Status: AC | PRN
  Administered 2018-08-11: 10.8 via INTRAVENOUS
  Filled 2018-08-11: qty 11

## 2018-08-11 MED ORDER — TECHNETIUM TC 99M TETROFOSMIN IV KIT
32.1000 | PACK | Freq: Once | INTRAVENOUS | Status: AC | PRN
  Administered 2018-08-11: 32.1 via INTRAVENOUS
  Filled 2018-08-11: qty 33

## 2018-08-11 MED ORDER — REGADENOSON 0.4 MG/5ML IV SOLN
0.4000 mg | Freq: Once | INTRAVENOUS | Status: AC
  Administered 2018-08-11: 0.4 mg via INTRAVENOUS

## 2018-08-18 ENCOUNTER — Other Ambulatory Visit (HOSPITAL_COMMUNITY): Payer: Self-pay

## 2018-08-18 ENCOUNTER — Telehealth: Payer: Self-pay | Admitting: *Deleted

## 2018-08-18 NOTE — Telephone Encounter (Signed)
-----   Message from Earvin Hansen, LPN sent at 35/0/0938 10:21 AM EDT ----- Marykay Lex  Just spoke with [name] regarding stress test and she mentioned not hearing anything on sleep study yet. I told her I would f/u, so... I am lol Thanks [place]

## 2018-08-18 NOTE — Telephone Encounter (Signed)
Notified [name] of 09/22/18 sleep study appointment.

## 2018-09-21 ENCOUNTER — Ambulatory Visit: Payer: Medicare Other | Admitting: Cardiovascular Disease

## 2018-09-22 ENCOUNTER — Ambulatory Visit (HOSPITAL_BASED_OUTPATIENT_CLINIC_OR_DEPARTMENT_OTHER): Payer: Medicare Other | Attending: Cardiovascular Disease | Admitting: Cardiovascular Disease

## 2018-09-22 VITALS — Ht 72.0 in | Wt 275.0 lb

## 2018-09-22 DIAGNOSIS — R400 Somnolence: Secondary | ICD-10-CM

## 2018-09-22 DIAGNOSIS — G4733 Obstructive sleep apnea (adult) (pediatric): Secondary | ICD-10-CM | POA: Diagnosis not present

## 2018-09-22 DIAGNOSIS — Z79899 Other long term (current) drug therapy: Secondary | ICD-10-CM | POA: Diagnosis not present

## 2018-09-22 DIAGNOSIS — Z7982 Long term (current) use of aspirin: Secondary | ICD-10-CM | POA: Diagnosis not present

## 2018-09-22 DIAGNOSIS — R0902 Hypoxemia: Secondary | ICD-10-CM | POA: Insufficient documentation

## 2018-09-23 ENCOUNTER — Other Ambulatory Visit (HOSPITAL_BASED_OUTPATIENT_CLINIC_OR_DEPARTMENT_OTHER): Payer: Self-pay

## 2018-09-23 DIAGNOSIS — R400 Somnolence: Secondary | ICD-10-CM

## 2018-09-25 ENCOUNTER — Encounter (HOSPITAL_BASED_OUTPATIENT_CLINIC_OR_DEPARTMENT_OTHER): Payer: Self-pay | Admitting: Cardiovascular Disease

## 2018-09-25 NOTE — Procedures (Signed)
Patient Name: [name], [name] Date: 09/22/2018 Gender: Male D.O.B: 01-12-1951 Age (years): 28 Referring Provider: Skeet Latch Height (inches): 72 Interpreting Physician: Shelva Majestic MD, ABS[facility] Weight (lbs): 275 RPSGT: Laren Everts BMI: 37 MRN: [id number] Neck Size: 18.00  CLINICAL INFORMATION Sleep Study Type: NPSG  Indication for sleep study: Excessive Daytime Sleepiness, Hypertension, Obesity  Epworth Sleepiness Score: 12  SLEEP STUDY TECHNIQUE As per the AASM Manual for the Scoring of Sleep and Associated Events v2.3 (April 2016) with a hypopnea requiring 4% desaturations.  The channels recorded and monitored were frontal, central and occipital EEG, electrooculogram (EOG), submentalis EMG (chin), nasal and oral airflow, thoracic and abdominal wall motion, anterior tibialis EMG, snore microphone, electrocardiogram, and pulse oximetry.  MEDICATIONS     acetaminophen (TYLENOL) 500 MG tablet         albuterol (PROVENTIL HFA;VENTOLIN HFA) 108 (90 Base) MCG/ACT inhaler         aspirin EC 81 MG tablet         atorvastatin (LIPITOR) 20 MG tablet         benztropine (COGENTIN) 0.5 MG tablet         chlorproMAZINE (THORAZINE) 100 MG tablet         chlorproMAZINE (THORAZINE) 25 MG tablet         Cholecalciferol (VITAMIN D-3 PO)         cloZAPine (CLOZARIL) 100 MG tablet         clozapine (CLOZARIL) 50 MG tablet         docusate sodium (COLACE) 100 MG capsule         escitalopram (LEXAPRO) 10 MG tablet         lamoTRIgine (LAMICTAL) 25 MG tablet         levothyroxine (SYNTHROID, LEVOTHROID) 100 MCG tablet         Nystatin POWD         Omega-3 Fatty Acids (FISH OIL) 1000 MG CAPS         polyethylene glycol (MIRALAX / GLYCOLAX) packet         senna (SENOKOT) 8.6 MG TABS tablet      Medications self-administered by patient taken the night of the study : N/A  SLEEP ARCHITECTURE The study was initiated at 9:40:59 PM and ended at 4:48:41 AM.  Sleep onset time  was 3.8 minutes and the sleep efficiency was 86.1%%. The total sleep time was 368.4 minutes.  Stage REM latency was 270.5 minutes.  The patient spent 1.8%% of the night in stage N1 sleep, 90.2%% in stage N2 sleep, 0.0%% in stage N3 and 8% in REM.  Alpha intrusion was absent.  Supine sleep was 64.59%.  RESPIRATORY PARAMETERS The overall apnea/hypopnea index (AHI) was 8.0 per hour.  The respiratory disturbance index (RDI) was 9.1/h. There were 21 total apneas, including 10 obstructive, 10 central and 1 mixed apneas. There were 28 hypopneas and 7 RERAs.  The AHI during Stage REM sleep was 36.6 per hour.  AHI while supine was 12.4 per hour.  The mean oxygen saturation was 92.6%. The minimum SpO2 during sleep was 87.0%.  moderate snoring was noted during this study.  CARDIAC DATA The 2 lead EKG demonstrated sinus rhythm. The mean heart rate was 69.8 beats per minute. Other EKG findings include: PVCs.  LEG MOVEMENT DATA The total PLMS were 0 with a resulting PLMS index of 0.0. Associated arousal with leg movement index was 0.0 .  IMPRESSIONS - Mild obstructive sleep apnea overall (AHI 8.0/h;  RDI 9.1/h); however, events were more significant with supine position (AHI 12.4/h) and severe during REM sleep (AHI 36.6/h). - No significant central sleep apnea occurred during this study (CAI = 1.6/h). - Mild oxygen desaturation to a nadir of 87.0%. - Abnormal sleep architecture with absent of slow wave sleep and prolonged latency to REM sleep.  - Reduction in REM sleep. - The patient snored with moderate snoring volume. - EKG findings include PVCs. - Clinically significant periodic limb movements did not occur during sleep. No significant associated arousals.  DIAGNOSIS - Obstructive Sleep Apnea (327.23 [G47.33 ICD-10]) - Nocturnal Hypoxemia (327.26 [G47.36 ICD-10])  RECOMMENDATIONS - Therapeutic CPAP titration to determine optimal pressure required to alleviate sleep disordered  breathing. - Efort should be made to optimize nasal and oropharyngeal patency.  - Positional therapy avoiding supine position during sleep. - Avoid alcohol, sedatives and other CNS depressants that may worsen sleep apnea and disrupt normal sleep architecture. - Sleep hygiene should be reviewed to assess factors that may improve sleep quality. - Weight management (BMI 37) and regular exercise should be initiated or continued if appropriate.  [Electronically signed] 09/25/2018 08:48 PM  Shelva Majestic MD, [facility], [place], American Board of Sleep Medicine   NPI: [id number]  [place] PH: [phone]   FX: [phone] [place]

## 2018-09-26 ENCOUNTER — Other Ambulatory Visit: Payer: Self-pay | Admitting: Cardiovascular Disease

## 2018-09-26 ENCOUNTER — Telehealth: Payer: Self-pay | Admitting: *Deleted

## 2018-09-26 DIAGNOSIS — G4733 Obstructive sleep apnea (adult) (pediatric): Secondary | ICD-10-CM

## 2018-09-26 NOTE — Telephone Encounter (Signed)
Patient's guardian [name], informed of sleep study results and recommendations. She was also informed of CPAP titration appointment scheduled for 11/06/18.

## 2018-09-26 NOTE — Telephone Encounter (Signed)
-----   Message from Troy Sine, MD sent at 09/25/2018  8:53 PM EST ----- Mariann Laster, please notify pt of result and schedule for CPAP titration study

## 2018-09-27 ENCOUNTER — Other Ambulatory Visit: Payer: Self-pay

## 2018-09-27 ENCOUNTER — Other Ambulatory Visit: Payer: Self-pay | Admitting: Cardiovascular Disease

## 2018-09-27 ENCOUNTER — Ambulatory Visit (HOSPITAL_COMMUNITY): Payer: Medicare Other | Attending: Cardiology

## 2018-09-27 DIAGNOSIS — R0609 Other forms of dyspnea: Secondary | ICD-10-CM | POA: Diagnosis present

## 2018-09-27 DIAGNOSIS — R0602 Shortness of breath: Secondary | ICD-10-CM

## 2018-10-03 ENCOUNTER — Encounter: Payer: Self-pay | Admitting: Adult Health

## 2018-10-03 ENCOUNTER — Ambulatory Visit (INDEPENDENT_AMBULATORY_CARE_PROVIDER_SITE_OTHER): Payer: Medicare Other | Admitting: Adult Health

## 2018-10-03 VITALS — BP 141/74 | HR 100 | Ht 72.0 in | Wt 270.4 lb

## 2018-10-03 DIAGNOSIS — I2129 ST elevation (STEMI) myocardial infarction involving other sites: Secondary | ICD-10-CM

## 2018-10-03 DIAGNOSIS — I10 Essential (primary) hypertension: Secondary | ICD-10-CM

## 2018-10-03 DIAGNOSIS — G4733 Obstructive sleep apnea (adult) (pediatric): Secondary | ICD-10-CM

## 2018-10-03 MED ORDER — METOPROLOL TARTRATE 25 MG PO TABS: 12.5000 mg | ORAL_TABLET | Freq: Two times a day (BID) | ORAL | 5 refills | Status: DC

## 2018-10-03 MED ORDER — ATORVASTATIN CALCIUM 20 MG PO TABS: 20.0000 mg | ORAL_TABLET | Freq: Every day | ORAL | 5 refills | Status: DC

## 2018-10-03 MED ORDER — METOPROLOL TARTRATE 25 MG PO TABS: 12.5000 mg | ORAL_TABLET | Freq: Two times a day (BID) | ORAL | 1 refills | Status: DC

## 2018-10-03 NOTE — Progress Notes (Signed)
Cardiology Office Note   Date:  10/03/2018   ID:  [name], DOB [birth year], MRN [id number]  PCP:  Todd Shell, FNP  Cardiologist:  [place]   Chief Complaint:  Hospital Follow up.    History of Present Illness: [name] is a 67 y.o. male who presents for ongoing assessment and management of HTN, HL with hx of CKD III, prolonged QT interval, CLL, COPD, and schizophrenia. He was referred to cardiology for RBBB, with echo determining EF of 65%-70%, severe LVH, with grade I diastolic dysfunction. She was seen during admission for pneumonia and was also treated for cellulitis.Marland Kitchen He is a resident of a group home and is very inactive.  Lexiscan stress test was normal  On  08/11/2018.   He denies chest pain, dyspnea. He is not cognitively able to express his symptoms. He has had the first portion of his sleep study and is to have the CPAP titration study.   Past Medical History:  Diagnosis Date  . Cellulitis   . CHF (congestive heart failure) ([place])   . Chronic kidney disease 09/20/2008  . Chronic lymphocytic leukemia ([place])   . cll dx'd 10/2003   no rx  . CLL (chronic lymphoblastic leukemia) 10/2003  . COPD (chronic obstructive pulmonary disease) ([place])   . GERD (gastroesophageal reflux disease)   . Hiatal hernia   . Hyperlipemia   . Hypertension   . Lower extremity edema   . NHL (non-Hodgkin's lymphoma) ([place]) dx'd 08/2008 rt groin   chemo comp 08/2008  . Non Hodgkin's lymphoma ([place]) 08/2008  . Non Hodgkin's lymphoma ([place])   . Paranoid schizophrenia ([place])   . Prolonged Q-T interval on ECG   . RBBB 09/17/2017  . Schizoaffective disorder ([place])   . Sepsis ([place])   . Severe left ventricular hypertrophy 10/21/2017  . Vitamin D deficiency     Past Surgical History:  Procedure Laterality Date  . LYMPH NODE BIOPSY       Current Outpatient Medications  Medication Sig Dispense Refill  . acetaminophen (TYLENOL) 500 MG tablet Take 2 tablets (1,000 mg total) by mouth  every 6 (six) hours as needed. 100 tablet 2  . albuterol (PROVENTIL HFA;VENTOLIN HFA) 108 (90 Base) MCG/ACT inhaler Inhale 2 puffs into the lungs every 4 (four) hours as needed for wheezing or shortness of breath.    Marland Kitchen aspirin EC 81 MG tablet Take 81 mg by mouth daily.    Marland Kitchen atorvastatin (LIPITOR) 20 MG tablet Take 1 tablet (20 mg total) by mouth daily. 30 tablet 5  . benztropine (COGENTIN) 0.5 MG tablet Take 1 tablet (0.5 mg total) by mouth 2 (two) times daily.    . chlorproMAZINE (THORAZINE) 100 MG tablet Take 100 mg at bedtime by mouth.     . chlorproMAZINE (THORAZINE) 25 MG tablet Take 2 tablets (50 mg total) by mouth 3 (three) times daily.    . Cholecalciferol (VITAMIN D-3 PO) Take 5,000 mg by mouth daily.    . cloZAPine (CLOZARIL) 100 MG tablet Take 100 mg by mouth 2 (two) times daily.     . clozapine (CLOZARIL) 50 MG tablet Take 50 mg by mouth 2 (two) times daily.     Marland Kitchen docusate sodium (COLACE) 100 MG capsule Take 100 mg by mouth daily.    Marland Kitchen escitalopram (LEXAPRO) 10 MG tablet Take 10 mg by mouth daily before breakfast.     . lamoTRIgine (LAMICTAL) 25 MG tablet Take 50 mg by mouth 3 (three) times daily.     Marland Kitchen  levothyroxine (SYNTHROID, LEVOTHROID) 100 MCG tablet Take 100 mcg daily by mouth.     . Nystatin POWD Apply 1 application topically daily after breakfast. Apply to buttocks    . Omega-3 Fatty Acids (FISH OIL) 1000 MG CAPS Take 4,000 mg by mouth daily.    . polyethylene glycol (MIRALAX / GLYCOLAX) packet Take 17 g daily by mouth. Dissolve 17g in 8oz of water/juice and drink by mouth every day    . senna (SENOKOT) 8.6 MG TABS tablet Take 1 tablet by mouth daily.    . metoprolol tartrate (LOPRESSOR) 25 MG tablet Take 0.5 tablets (12.5 mg total) by mouth 2 (two) times daily. 60 tablet 5   No current facility-administered medications for this visit.     Allergies:   Sulfamethoxazole    Social History:  The patient  reports that he has never smoked. He has never used smokeless tobacco.  He reports that he does not drink alcohol or use drugs.   Family History:  The patient's family history includes Alcohol abuse in his father.    ROS: All other systems are reviewed and negative. Unless otherwise mentioned in H&P    PHYSICAL EXAM: VS:  BP (!) 141/74   Pulse 100   Ht 6' (1.829 m)   Wt 270 lb 6.4 oz (122.7 kg)   BMI 36.67 kg/m  , BMI Body mass index is 36.67 kg/m. GEN: Well nourished, well developed, in no acute distress HEENT: normal Neck: no JVD, carotid bruits, or masses Cardiac:RRR; tachycardic, no murmurs, rubs, or gallops,no edema  Respiratory:  Clear to auscultation bilaterally, normal work of breathing GI: soft, nontender, nondistended, + BS MS: no deformity or atrophy Skin: warm and dry, no rash Neuro:  Strength and sensation are intact Psych: euthymic mood, full affect   EKG: Not completed this office visit.   Recent Labs: 12/03/2017: TSH 1.212 07/20/2018: ALT 156; BUN 16; Creatinine, Ser 1.62; Hemoglobin 11.9; Magnesium 2.0; Platelets 148; Potassium 4.6; Sodium 144    Lipid Panel    Component Value Date/Time   CHOL  11/13/2009 0655    158        ATP III CLASSIFICATION:  <200     mg/dL   Desirable  200-239  mg/dL   Borderline High  >=240    mg/dL   High          TRIG 267 (H) 11/13/2009 0655   HDL 38 (L) 11/13/2009 0655   CHOLHDL 4.2 11/13/2009 0655   VLDL 53 (H) 11/13/2009 0655   LDLCALC  11/13/2009 0655    67        Total Cholesterol/HDL:CHD Risk Coronary Heart Disease Risk Table                     Men   Women  1/2 Average Risk   3.4   3.3  Average Risk       5.0   4.4  2 X Average Risk   9.6   7.1  3 X Average Risk  23.4   11.0        Use the calculated Patient Ratio above and the CHD Risk Table to determine the patient's CHD Risk.        ATP III CLASSIFICATION (LDL):  <100     mg/dL   Optimal  100-129  mg/dL   Near or Above                    Optimal  130-159  mg/dL   Borderline  160-189  mg/dL   High  >190     mg/dL   Very  High      Wt Readings from Last 3 Encounters:  10/03/18 270 lb 6.4 oz (122.7 kg)  09/22/18 275 lb (124.7 kg)  08/11/18 266 lb (120.7 kg)    Other studies Reviewed: Echocardiogram 2018/10/10 Left ventricle: The cavity size was normal. Wall thickness was   increased in a pattern of moderate LVH. Systolic function was   vigorous. The estimated ejection fraction was in the range of 65%   to 70%. Wall motion was normal; there were no regional wall   motion abnormalities. The tissue Doppler parameters were   abnormal. Findings consistent with left ventricular diastolic   dysfunction. - Aortic valve: There was no regurgitation. - Mitral valve: There was no regurgitation. - Right ventricle: Systolic function was mildly reduced. - Atrial septum: No defect or patent foramen ovale was identified. - Tricuspid valve: There was no significant regurgitation. - Pulmonic valve: There was no significant regurgitation.  Stress Test 08/11/2018  Response to Stress There was no ST segment deviation noted during stress.  Arrhythmias during stress: none.  Arrhythmias during recovery: none.  There were no significant arrhythmias noted during the test.  ECG was uninterpretable.    ASSESSMENT AND PLAN:  1. Hypertension: BP is well controlled currently.  2. Tachycardia: Heart rate is elevated at rest. I will begin metoprolol tartrate 12.5 mg BID for HR control.   3. Hypercholesterolemia: He is to continue statin therapy.   4. LVH: Consider Amyloid NM study for identification of amyloid should he continue symptomatic. The heart rate needs to be controlled first before further testing.    Current medicines are reviewed at length with the patient today.    Labs/ tests ordered today include: None   Phill Myron. West Pugh, ANP, AACC   10/03/2018 4:15 PM    [place] [address] Office [phone] Fax [phone]

## 2018-10-03 NOTE — Patient Instructions (Signed)
Medication Instructions:  Start metoprolol tartrate 12.5mg  twice daily  If you need a refill on your cardiac medications before your next appointment, please call your pharmacy.  Labwork: If you have labs (blood work) drawn today and your tests are completely normal, you will receive your results only by: Marland Kitchen MyChart Message (if you have MyChart) OR . A paper copy in the mail If you have any lab test that is abnormal or we need to change your treatment, we will call you to review the results.  Follow-Up: You will need a follow up appointment in [place].  You may see  DR Tiana Loft, DNP, AACC or one of the following Advanced Practice Providers on your designated Care Team:    . Almyra Deforest, PA-C . Fabian Sharp, PA-C  At [facility], you and your health needs are our priority.  As part of our continuing mission to provide you with exceptional heart care, we have created designated Provider Care Teams.  These Care Teams include your primary Cardiologist (physician) and Advanced Practice Providers (APPs -  Physician Assistants and Nurse Practitioners) who all work together to provide you with the care you need, when you need it.  Thank you for choosing [facility] at [facility]!!

## 2018-11-06 ENCOUNTER — Ambulatory Visit (HOSPITAL_BASED_OUTPATIENT_CLINIC_OR_DEPARTMENT_OTHER): Payer: Medicare Other | Attending: Cardiovascular Disease | Admitting: Cardiovascular Disease

## 2018-11-06 VITALS — Ht 72.0 in | Wt 275.0 lb

## 2018-11-06 DIAGNOSIS — Z79890 Hormone replacement therapy (postmenopausal): Secondary | ICD-10-CM | POA: Diagnosis not present

## 2018-11-06 DIAGNOSIS — G4733 Obstructive sleep apnea (adult) (pediatric): Secondary | ICD-10-CM | POA: Diagnosis present

## 2018-11-06 DIAGNOSIS — Z79899 Other long term (current) drug therapy: Secondary | ICD-10-CM | POA: Insufficient documentation

## 2018-11-06 DIAGNOSIS — Z7982 Long term (current) use of aspirin: Secondary | ICD-10-CM | POA: Insufficient documentation

## 2018-11-20 ENCOUNTER — Encounter (HOSPITAL_BASED_OUTPATIENT_CLINIC_OR_DEPARTMENT_OTHER): Payer: Self-pay | Admitting: Cardiovascular Disease

## 2018-11-20 NOTE — Procedures (Signed)
Patient Name: [name], [name] Date: 11/06/2018 Gender: Male D.O.B: 06-25-1951 Age (years): 73 Referring Provider: Shelva Majestic MD, ABSM Height (inches): 72 Interpreting Physician: Shelva Majestic MD, ABSM Weight (lbs): 275 RPSGT: Rebekah Chesterfield BMI: 37 MRN: [id number] Neck Size: 18.00  CLINICAL INFORMATION The patient is referred for a CPAP titration to treat sleep apnea.  Date of NPSG: 09/22/2018:  AHI 8.0/h; RDI 9.1/h; REM sleep AHI 36.6/h; supine sleep AHI 12.4/h: O2 desaturation 87%.  SLEEP STUDY TECHNIQUE As per the AASM Manual for the Scoring of Sleep and Associated Events v2.3 (April 2016) with a hypopnea requiring 4% desaturations.  The channels recorded and monitored were frontal, central and occipital EEG, electrooculogram (EOG), submentalis EMG (chin), nasal and oral airflow, thoracic and abdominal wall motion, anterior tibialis EMG, snore microphone, electrocardiogram, and pulse oximetry. Continuous positive airway pressure (CPAP) was initiated at the beginning of the study and titrated to treat sleep-disordered breathing.  MEDICATIONS     acetaminophen (TYLENOL) 500 MG tablet             albuterol (PROVENTIL HFA;VENTOLIN HFA) 108 (90 Base) MCG/ACT inhaler         aspirin EC 81 MG tablet         atorvastatin (LIPITOR) 20 MG tablet         benztropine (COGENTIN) 0.5 MG tablet         chlorproMAZINE (THORAZINE) 100 MG tablet         chlorproMAZINE (THORAZINE) 25 MG tablet         Cholecalciferol (VITAMIN D-3 PO)         cloZAPine (CLOZARIL) 100 MG tablet         clozapine (CLOZARIL) 50 MG tablet         docusate sodium (COLACE) 100 MG capsule         escitalopram (LEXAPRO) 10 MG tablet         lamoTRIgine (LAMICTAL) 25 MG tablet         levothyroxine (SYNTHROID, LEVOTHROID) 100 MCG tablet         metoprolol tartrate (LOPRESSOR) 25 MG tablet         Nystatin POWD         Omega-3 Fatty Acids (FISH OIL) 1000 MG CAPS         polyethylene glycol (MIRALAX /  GLYCOLAX) packet         senna (SENOKOT) 8.6 MG TABS tablet      Medications self-administered by patient taken the night of the study : N/A  TECHNICIAN COMMENTS Comments added by technician: None Comments added by scorer: N/A  RESPIRATORY PARAMETERS Optimal PAP Pressure (cm): 20 AHI at Optimal Pressure (/hr): 1.2 Overall Minimal O2 (%): 89.0 Supine % at Optimal Pressure (%): 100 Minimal O2 at Optimal Pressure (%): 93.0   SLEEP ARCHITECTURE The study was initiated at 10:25:36 PM and ended at 5:25:22 AM.  Sleep onset time was 12.5 minutes and the sleep efficiency was 96.5%%. The total sleep time was 405 minutes.  The patient spent 1.4%% of the night in stage N1 sleep, 74.3%% in stage N2 sleep, 0.0%% in stage N3 and 24.3% in REM.Stage REM latency was 77.0 minutes  Wake after sleep onset was 2.3. Alpha intrusion was absent. Supine sleep was 72.69%.  CARDIAC DATA The 2 lead EKG demonstrated sinus rhythm. The mean heart rate was 71.7 beats per minute. Other EKG findings include: None.  LEG MOVEMENT DATA The total Periodic Limb Movements of Sleep (PLMS) were 0.  The PLMS index was 0.0. A PLMS index of <15 is considered normal in adults.  IMPRESSIONS - CPAP was initiated at 5 cm and was titrated to maximal CPAP pressure of 20 cm of water. Rare to occasional central events were noted at higher pressures. AHI at 20 cm was 1.2/h, although supine sleep but not REM sleep was achieved at this pressure.  - Central sleep apnea was not noted during this titration (CAI = 2.4/h). - Mild oxygen desaturations to a nadir of 89% at 15 cm.  - The patient snored with moderate snoring volume during this titration study. - No cardiac abnormalities were observed during this study. - Clinically significant periodic limb movements were not noted during this study. Arousals associated with PLMs were rare.  DIAGNOSIS - Obstructive Sleep Apnea (327.23 [G47.33 ICD-10])  RECOMMENDATIONS - Recommend an initial  trial of CPAP Auto therapy with EPR /A-Flex at 16 - 20 cm H2O with heated humidification.  A Large size Fisher[employer]Paykel Full Face Mask Simplus mask was used for the titration. If higher pressure is necessary and continued central events develop, BiPAP titration may be necessary. - Efforts should be made to optimize nasal and oropharyngeal patency.  - Avoid alcohol, sedatives and other CNS depressants that may worsen sleep apnea and disrupt normal sleep architecture. - Sleep hygiene should be reviewed to assess factors that may improve sleep quality. - Weight management and regular exercise should be initiated or continued. - Recommend a download in 30 dats and sleep clinic evaluation after 4 weeks of therapy.   [Electronically signed] 11/20/2018 11:49 AM  Shelva Majestic MD, [facility], [place], American Board of Sleep Medicine   NPI: [id number] [place] PH: [phone]   FX: [phone] [place]

## 2018-11-22 ENCOUNTER — Telehealth: Payer: Self-pay | Admitting: *Deleted

## 2018-11-22 NOTE — Telephone Encounter (Signed)
-----   Message from Troy Sine, MD sent at 11/20/2018 11:53 AM EST ----- Mariann Laster please arrange with DME for CPAP set up

## 2018-11-22 NOTE — Telephone Encounter (Signed)
Left message sleep study has been completed. Referral hs been sent to [facility].

## 2018-12-19 ENCOUNTER — Telehealth: Payer: Self-pay | Admitting: *Deleted

## 2018-12-19 NOTE — Telephone Encounter (Signed)
Left message for [name] to return a call to me. Need to clarify who will manage CPAP therapy.

## 2018-12-26 ENCOUNTER — Emergency Department (HOSPITAL_COMMUNITY): Payer: Medicare Other

## 2018-12-26 ENCOUNTER — Encounter (HOSPITAL_COMMUNITY): Payer: Self-pay

## 2018-12-26 ENCOUNTER — Emergency Department (HOSPITAL_COMMUNITY)
Admission: EM | Admit: 2018-12-26 | Discharge: 2018-12-26 | Disposition: A | Discharge status: Home / Self Care | Payer: Medicare Other | Attending: Emergency Medicine | Admitting: Emergency Medicine

## 2018-12-26 DIAGNOSIS — N183 Chronic kidney disease, stage 3 (moderate): Secondary | ICD-10-CM | POA: Insufficient documentation

## 2018-12-26 DIAGNOSIS — Z79899 Other long term (current) drug therapy: Secondary | ICD-10-CM | POA: Insufficient documentation

## 2018-12-26 DIAGNOSIS — E039 Hypothyroidism, unspecified: Secondary | ICD-10-CM | POA: Insufficient documentation

## 2018-12-26 DIAGNOSIS — Z8572 Personal history of non-Hodgkin lymphomas: Secondary | ICD-10-CM | POA: Insufficient documentation

## 2018-12-26 DIAGNOSIS — R4182 Altered mental status, unspecified: Secondary | ICD-10-CM | POA: Diagnosis present

## 2018-12-26 DIAGNOSIS — R404 Transient alteration of awareness: Secondary | ICD-10-CM | POA: Diagnosis not present

## 2018-12-26 DIAGNOSIS — I509 Heart failure, unspecified: Secondary | ICD-10-CM | POA: Diagnosis not present

## 2018-12-26 DIAGNOSIS — I130 Hypertensive heart and chronic kidney disease with heart failure and stage 1 through stage 4 chronic kidney disease, or unspecified chronic kidney disease: Secondary | ICD-10-CM | POA: Diagnosis not present

## 2018-12-26 DIAGNOSIS — M5432 Sciatica, left side: Secondary | ICD-10-CM

## 2018-12-26 LAB — URINALYSIS, ROUTINE W REFLEX MICROSCOPIC
Bilirubin Urine: NEGATIVE
Glucose, UA: NEGATIVE mg/dL
Hgb urine dipstick: NEGATIVE
KETONES UR: NEGATIVE mg/dL
Leukocytes, UA: NEGATIVE
Nitrite: NEGATIVE
PROTEIN: NEGATIVE mg/dL
Specific Gravity, Urine: 1.014 (ref 1.005–1.030)
pH: 6 (ref 5.0–8.0)

## 2018-12-26 LAB — CBC WITH DIFFERENTIAL/PLATELET
Abs Immature Granulocytes: 0.02 10*3/uL (ref 0.00–0.07)
Basophils Absolute: 0.1 10*3/uL (ref 0.0–0.1)
Basophils Relative: 1 %
EOS PCT: 4 %
Eosinophils Absolute: 0.3 10*3/uL (ref 0.0–0.5)
HCT: 41.4 % (ref 39.0–52.0)
Hemoglobin: 12.9 g/dL — ABNORMAL LOW (ref 13.0–17.0)
Immature Granulocytes: 0 %
Lymphocytes Relative: 21 %
Lymphs Abs: 2 10*3/uL (ref 0.7–4.0)
MCH: 30.4 pg (ref 26.0–34.0)
MCHC: 31.2 g/dL (ref 30.0–36.0)
MCV: 97.6 fL (ref 80.0–100.0)
MONO ABS: 0.7 10*3/uL (ref 0.1–1.0)
Monocytes Relative: 8 %
Neutro Abs: 6.4 10*3/uL (ref 1.7–7.7)
Neutrophils Relative %: 66 %
Platelets: 151 10*3/uL (ref 150–400)
RBC: 4.24 MIL/uL (ref 4.22–5.81)
RDW: 14.1 % (ref 11.5–15.5)
WBC: 9.5 10*3/uL (ref 4.0–10.5)
nRBC: 0 % (ref 0.0–0.2)

## 2018-12-26 LAB — COMPREHENSIVE METABOLIC PANEL
ALT: 23 U/L (ref 0–44)
AST: 23 U/L (ref 15–41)
Albumin: 3.7 g/dL (ref 3.5–5.0)
Alkaline Phosphatase: 68 U/L (ref 38–126)
Anion gap: 8 (ref 5–15)
BUN: 23 mg/dL (ref 8–23)
CALCIUM: 9.1 mg/dL (ref 8.9–10.3)
CO2: 24 mmol/L (ref 22–32)
Chloride: 108 mmol/L (ref 98–111)
Creatinine, Ser: 1.97 mg/dL — ABNORMAL HIGH (ref 0.61–1.24)
GFR calc Af Amer: 40 mL/min — ABNORMAL LOW (ref >60)
GFR, EST NON AFRICAN AMERICAN: 35 mL/min — AB (ref >60)
Glucose, Bld: 102 mg/dL — ABNORMAL HIGH (ref 70–99)
Potassium: 5 mmol/L (ref 3.5–5.1)
Sodium: 140 mmol/L (ref 135–145)
TOTAL PROTEIN: 5.8 g/dL — AB (ref 6.5–8.1)
Total Bilirubin: 0.5 mg/dL (ref 0.3–1.2)

## 2018-12-26 LAB — RAPID URINE DRUG SCREEN, HOSP PERFORMED
Amphetamines: NOT DETECTED
Barbiturates: NOT DETECTED
Benzodiazepines: NOT DETECTED
Cocaine: NOT DETECTED
Opiates: NOT DETECTED
Tetrahydrocannabinol: NOT DETECTED

## 2018-12-26 LAB — I-STAT TROPONIN, ED: Troponin i, poc: 0 ng/mL (ref 0.00–0.08)

## 2018-12-26 LAB — CBG MONITORING, ED: GLUCOSE-CAPILLARY: 151 mg/dL — AB (ref 70–99)

## 2018-12-26 LAB — AMMONIA: AMMONIA: 30 umol/L (ref 9–35)

## 2018-12-26 MED ORDER — METHYLPREDNISOLONE 4 MG PO TBPK: ORAL_TABLET | ORAL | 0 refills | Status: DC

## 2018-12-26 MED ORDER — MELOXICAM 15 MG PO TABS: 15.0000 mg | ORAL_TABLET | Freq: Every day | ORAL | 0 refills | Status: DC

## 2018-12-26 NOTE — Discharge Instructions (Addendum)
Contact a health care provider if:  You have pain that wakes you up when you are sleeping.  You have pain that gets worse when you lie down.  Your pain is worse than you have experienced in the past.  Your pain lasts longer than 4 weeks.  You experience unexplained weight loss.  Get help right away if:  You lose control of your bowel or bladder (incontinence).  You have:  Weakness in your lower back, pelvis, buttocks, or legs that gets worse.  Redness or swelling of your back.  A burning sensation when you urinate.

## 2018-12-26 NOTE — ED Notes (Signed)
Took pt to bathroom for urine sample.  Unable to urinate at this time.

## 2018-12-26 NOTE — ED Notes (Signed)
Call made to patient's guardian [name]. Guardian stated she would be coming to pick up patient.

## 2018-12-26 NOTE — ED Notes (Addendum)
Patient is resting comfortably. 

## 2018-12-26 NOTE — ED Provider Notes (Signed)
[facility] DEPT Provider Note   CSN: [id number] Arrival date & time: 12/26/18  1319     History   Chief Complaint No chief complaint on file.   HPI [name] is a 68 y.o. male with a past medical history of Paranoid shcizophrenia, CLL, non hodgkin;s lymphjoma, LVH, HTN, HLD.  There is a level 5 caveat due to altered mental status and underlying psychiatric disorder. [name] Is here in the emergency department with his legal guardian.  According to nursing notes and the legal guardian the patient has been complaining over the past week at his facility that he "does not feel well."  Today he wanted to sleep most of the day and when staff went to check on him after lunch he was laying in bed facing the wall and would not respond to them verbally.  He was lying with his eyes open.  Patient states he does not remember what happened.  Patient does state that he has pain in his left leg which makes walking difficult for him.  He feels like it is been going on for a week.  He complains of pain down the back of his left leg which is most works when he tries to walk with his walker.  Patient denies any new weakness.  He has had no falls.  HPI  Past Medical History:  Diagnosis Date  . Cellulitis   . CHF (congestive heart failure) ([place])   . Chronic kidney disease 09/20/2008  . Chronic lymphocytic leukemia ([place])   . cll dx'd 10/2003   no rx  . CLL (chronic lymphoblastic leukemia) 10/2003  . COPD (chronic obstructive pulmonary disease) ([place])   . GERD (gastroesophageal reflux disease)   . Hiatal hernia   . Hyperlipemia   . Hypertension   . Lower extremity edema   . NHL (non-Hodgkin's lymphoma) ([place]) dx'd 08/2008 rt groin   chemo comp 08/2008  . Non Hodgkin's lymphoma ([place]) 08/2008  . Non Hodgkin's lymphoma ([place])   . Paranoid schizophrenia ([place])   . Prolonged Q-T interval on ECG   . RBBB 09/17/2017  . Schizoaffective disorder ([place])   . Sepsis  ([place])   . Severe left ventricular hypertrophy 10/21/2017  . Vitamin D deficiency     Patient Active Problem List   Diagnosis Date Noted  . Small bowel obstruction ([place]) 02/19/2018  . Cellulitis 12/02/2017  . Severe left ventricular hypertrophy 10/21/2017  . Stage II pressure injury of buttocks 10/03/2017  . Obesity (BMI 30-39.9) 10/03/2017  . Hyperlipidemia 10/03/2017  . Hypothyroidism 10/03/2017  . RBBB 09/17/2017  . Lobar pneumonia ([place]) 02/12/2016  . Cellulitis of right lower extremity 03/28/2015  . Sepsis ([place]) 03/27/2015  . Prolonged QT interval   . CLL (chronic lymphocytic leukemia) ([place]) 09/12/2013  . Non Hodgkin's lymphoma ([place]) 09/12/2013  . Leukocytosis 12/24/2012  . Anemia 12/24/2012  . CKD (chronic kidney disease), stage III ([place]) 12/24/2012  . Schizophrenia ([place]) 12/24/2012    Past Surgical History:  Procedure Laterality Date  . LYMPH NODE BIOPSY          Home Medications    Prior to Admission medications   Medication Sig Start Date End Date Taking? Authorizing Provider  acetaminophen (TYLENOL) 500 MG tablet Take 2 tablets (1,000 mg total) by mouth every 6 (six) hours as needed. 07/20/18 07/20/19 Yes Doreatha Lew, MD  aspirin EC 81 MG tablet Take 81 mg by mouth daily.   Yes [provider]  atorvastatin (  LIPITOR) 20 MG tablet Take 1 tablet (20 mg total) by mouth daily. 10/03/18  Yes Skeet Latch, MD  benztropine (COGENTIN) 0.5 MG tablet Take 1 tablet (0.5 mg total) by mouth 2 (two) times daily. 07/20/18  Yes Patrecia Pour, Christean Grief, MD  chlorproMAZINE (THORAZINE) 100 MG tablet Take 100 mg at bedtime by mouth.    Yes [provider]  chlorproMAZINE (THORAZINE) 50 MG tablet Take 50 mg by mouth 3 (three) times daily. 12/14/18  Yes [provider]  Cholecalciferol (VITAMIN D-3 PO) Take 5,000 mg by mouth daily.   Yes [provider]  cloZAPine (CLOZARIL) 100 MG tablet Take 100 mg by mouth 2 (two) times daily.    Yes [provider]  clozapine (CLOZARIL) 50 MG tablet Take 50 mg by mouth 2 (two) times daily.    Yes [provider]  docusate sodium (COLACE) 100 MG capsule Take 100 mg by mouth daily.   Yes [provider]  escitalopram (LEXAPRO) 10 MG tablet Take 10 mg by mouth daily before breakfast.    Yes [provider]  lamoTRIgine (LAMICTAL) 25 MG tablet Take 50 mg by mouth 3 (three) times daily.    Yes [provider]  levothyroxine (SYNTHROID, LEVOTHROID) 100 MCG tablet Take 100 mcg daily by mouth.    Yes [provider]  metoprolol tartrate (LOPRESSOR) 25 MG tablet Take 0.5 tablets (12.5 mg total) by mouth 2 (two) times daily. 10/03/18 01/01/19 Yes Skeet Latch, MD  Nystatin POWD Apply 1 application topically daily after breakfast. Apply to buttocks   Yes [provider]  Omega-3 Fatty Acids (FISH OIL) 1000 MG CAPS Take 4,000 mg by mouth daily.   Yes [provider]  polyethylene glycol (MIRALAX / GLYCOLAX) packet Take 17 g daily by mouth. Dissolve 17g in 8oz of water/juice and drink by mouth every day   Yes [provider]  senna (SENOKOT) 8.6 MG TABS tablet Take 1 tablet by mouth daily.   Yes [provider]  albuterol (PROVENTIL HFA;VENTOLIN HFA) 108 (90 Base) MCG/ACT inhaler Inhale 2 puffs into the lungs every 4 (four) hours as needed for wheezing or shortness of breath.    [provider]  chlorproMAZINE (THORAZINE) 25 MG tablet Take 2 tablets (50 mg total) by mouth 3 (three) times daily. Patient not taking: Reported on 12/26/2018 07/20/18   Patrecia Pour, Christean Grief, MD  meloxicam (MOBIC) 15 MG tablet Take 1 tablet (15 mg total) by mouth daily. Take 1 daily with food. 12/26/18   Margarita Mail, PA-C  methylPREDNISolone (MEDROL DOSEPAK) 4 MG TBPK tablet Use as directed 12/26/18   Margarita Mail, PA-C    Family History Family History  Problem Relation Age of Onset  . Alcohol abuse Father     Social  History Social History   Tobacco Use  . Smoking status: Never Smoker  . Smokeless tobacco: Never Used  Substance Use Topics  . Alcohol use: No  . Drug use: No     Allergies   Sulfamethoxazole   Review of Systems Review of Systems  Ten systems reviewed and are negative for acute change, except as noted in the HPI.   Physical Exam Updated Vital Signs BP 122/67   Pulse 77   Temp 97.9 F (36.6 C) (Oral)   Resp 16   SpO2 97%   Physical Exam Vitals signs and nursing note reviewed. Exam conducted with a chaperone present.  Constitutional:      General: He is not in acute distress.  Appearance: He is well-developed. He is not diaphoretic.  HENT:     Head: Normocephalic and atraumatic.  Eyes:     General: No scleral icterus.    Conjunctiva/sclera: Conjunctivae normal.  Neck:     Musculoskeletal: Normal range of motion and neck supple.  Cardiovascular:     Rate and Rhythm: Normal rate and regular rhythm.     Heart sounds: Normal heart sounds.  Pulmonary:     Effort: Pulmonary effort is normal. No respiratory distress.     Breath sounds: Normal breath sounds.  Abdominal:     Palpations: Abdomen is soft.     Tenderness: There is no abdominal tenderness.  Skin:    General: Skin is warm and dry.  Neurological:     General: No focal deficit present.     Mental Status: He is alert.     Cranial Nerves: Cranial nerves are intact.     Sensory: Sensation is intact.     Motor: Motor function is intact. No weakness.     Coordination: Coordination is intact.     Deep Tendon Reflexes: Reflexes are normal and symmetric.     Reflex Scores:      Patellar reflexes are 2+ on the right side and 2+ on the left side.    Comments: +straight leg test left  Psychiatric:        Behavior: Behavior normal.      ED Treatments / Results  Labs (all labs ordered are listed, but only abnormal results are displayed) Labs Reviewed  CBC WITH DIFFERENTIAL/PLATELET - Abnormal; Notable for  the following components:      Result Value   Hemoglobin 12.9 (*)    All other components within normal limits  COMPREHENSIVE METABOLIC PANEL - Abnormal; Notable for the following components:   Glucose, Bld 102 (*)    Creatinine, Ser 1.97 (*)    Total Protein 5.8 (*)    GFR calc non Af Amer 35 (*)    GFR calc Af Amer 40 (*)    All other components within normal limits  CBG MONITORING, ED - Abnormal; Notable for the following components:   Glucose-Capillary 151 (*)    All other components within normal limits  URINALYSIS, ROUTINE W REFLEX MICROSCOPIC  AMMONIA  RAPID URINE DRUG SCREEN, HOSP PERFORMED  I-STAT TROPONIN, ED    EKG None  Radiology Dg Chest 2 View  Result Date: 12/26/2018 CLINICAL DATA:  Altered mental status.  Lethargy. EXAM: CHEST - 2 VIEW COMPARISON:  Radiographs 07/17/2018 and 12/02/2017. FINDINGS: Persistent low lung volumes, especially on the lateral view. There is increased bibasilar atelectasis, but no edema, confluent airspace opacity, pneumothorax or significant pleural effusion. The heart size and mediastinal contours are stable. The bones appear unchanged. IMPRESSION: No acute findings allowing for suboptimal inspiration. Bibasilar atelectasis has mildly worsened. Electronically Signed   By: Richardean Sale M.D.   On: 12/26/2018 16:52   Ct Head Wo Contrast  Result Date: 12/26/2018 CLINICAL DATA:  AMS, lethargy EXAM: CT HEAD WITHOUT CONTRAST TECHNIQUE: Contiguous axial images were obtained from the base of the skull through the vertex without intravenous contrast. COMPARISON:  05/15/2015 FINDINGS: Brain: No evidence of acute infarction, hemorrhage, hydrocephalus, extra-axial collection or mass lesion/mass effect. Vascular: No hyperdense vessel or unexpected calcification. Skull: Normal. Negative for fracture or focal lesion. Sinuses/Orbits: No acute finding. Other: None. IMPRESSION: No acute intracranial pathology. Electronically Signed   By: Eddie Candle M.D.   On:  12/26/2018 16:48    Procedures Procedures (  including critical care time)  Medications Ordered in ED Medications - No data to display   Initial Impression / Assessment and Plan / ED Course  I have reviewed the triage vital signs and the nursing notes.  Pertinent labs & imaging results that were available during my care of the patient were reviewed by me and considered in my medical decision making (see chart for details).     Patient with negative chest x-ray, CT head negative. Abs without significant abnormality.  Patient will be charged to his care facility.   Will treat for presumed sciatica based on examination.  Patient will need outpatient follow-up.  No identifiable cause of his symptoms earlier.  Patient appears appropriate for discharge at this time Final Clinical Impressions(s) / ED Diagnoses   Final diagnoses:  Transient alteration of awareness  Sciatica, left side    ED Discharge Orders         Ordered    methylPREDNISolone (MEDROL DOSEPAK) 4 MG TBPK tablet     12/26/18 2140    meloxicam (MOBIC) 15 MG tablet  Daily     12/26/18 2140           Margarita Mail, PA-C 12/27/18 0002    Julianne Rice, MD 12/30/18 2125

## 2018-12-26 NOTE — ED Triage Notes (Signed)
Patient arrived via [facility] from [facility]. After lunch patient went to sleep. Staff tried to wake him up and patient was "staring at the wall".   Patient stated "he didn't feel right" Patient now states he feels fine.   Patient took all his morning medications.   Per staff patient is at norm.   Pupils are pinpoint.   Denies all symptoms.

## 2018-12-26 NOTE — ED Notes (Signed)
Pt was not able to urinate. 

## 2018-12-31 ENCOUNTER — Encounter (HOSPITAL_COMMUNITY): Payer: Self-pay | Admitting: Emergency Medicine

## 2018-12-31 ENCOUNTER — Other Ambulatory Visit: Payer: Self-pay

## 2018-12-31 ENCOUNTER — Emergency Department (HOSPITAL_COMMUNITY)
Admission: EM | Admit: 2018-12-31 | Discharge: 2018-12-31 | Disposition: A | Discharge status: Home / Self Care | Payer: Medicare Other | Attending: Emergency Medicine | Admitting: Emergency Medicine

## 2018-12-31 DIAGNOSIS — Z8572 Personal history of non-Hodgkin lymphomas: Secondary | ICD-10-CM | POA: Insufficient documentation

## 2018-12-31 DIAGNOSIS — N183 Chronic kidney disease, stage 3 (moderate): Secondary | ICD-10-CM | POA: Diagnosis not present

## 2018-12-31 DIAGNOSIS — R1030 Lower abdominal pain, unspecified: Secondary | ICD-10-CM | POA: Diagnosis present

## 2018-12-31 DIAGNOSIS — Z7982 Long term (current) use of aspirin: Secondary | ICD-10-CM | POA: Insufficient documentation

## 2018-12-31 DIAGNOSIS — F209 Schizophrenia, unspecified: Secondary | ICD-10-CM | POA: Insufficient documentation

## 2018-12-31 DIAGNOSIS — I130 Hypertensive heart and chronic kidney disease with heart failure and stage 1 through stage 4 chronic kidney disease, or unspecified chronic kidney disease: Secondary | ICD-10-CM | POA: Insufficient documentation

## 2018-12-31 DIAGNOSIS — J449 Chronic obstructive pulmonary disease, unspecified: Secondary | ICD-10-CM | POA: Diagnosis not present

## 2018-12-31 DIAGNOSIS — E039 Hypothyroidism, unspecified: Secondary | ICD-10-CM | POA: Diagnosis not present

## 2018-12-31 DIAGNOSIS — Z79899 Other long term (current) drug therapy: Secondary | ICD-10-CM | POA: Diagnosis not present

## 2018-12-31 DIAGNOSIS — I509 Heart failure, unspecified: Secondary | ICD-10-CM | POA: Insufficient documentation

## 2018-12-31 DIAGNOSIS — Z856 Personal history of leukemia: Secondary | ICD-10-CM | POA: Insufficient documentation

## 2018-12-31 DIAGNOSIS — B356 Tinea cruris: Secondary | ICD-10-CM | POA: Diagnosis not present

## 2018-12-31 LAB — CBC WITH DIFFERENTIAL/PLATELET
ABS IMMATURE GRANULOCYTES: 0.07 10*3/uL (ref 0.00–0.07)
Basophils Absolute: 0.1 10*3/uL (ref 0.0–0.1)
Basophils Relative: 0 %
Eosinophils Absolute: 0.1 10*3/uL (ref 0.0–0.5)
Eosinophils Relative: 1 %
HCT: 42.6 % (ref 39.0–52.0)
Hemoglobin: 13.3 g/dL (ref 13.0–17.0)
Immature Granulocytes: 1 %
Lymphocytes Relative: 14 %
Lymphs Abs: 1.9 10*3/uL (ref 0.7–4.0)
MCH: 30.9 pg (ref 26.0–34.0)
MCHC: 31.2 g/dL (ref 30.0–36.0)
MCV: 98.8 fL (ref 80.0–100.0)
MONO ABS: 1 10*3/uL (ref 0.1–1.0)
MONOS PCT: 8 %
NEUTROS ABS: 10.2 10*3/uL — AB (ref 1.7–7.7)
Neutrophils Relative %: 76 %
PLATELETS: 168 10*3/uL (ref 150–400)
RBC: 4.31 MIL/uL (ref 4.22–5.81)
RDW: 14.1 % (ref 11.5–15.5)
WBC: 13.2 10*3/uL — ABNORMAL HIGH (ref 4.0–10.5)
nRBC: 0 % (ref 0.0–0.2)

## 2018-12-31 LAB — COMPREHENSIVE METABOLIC PANEL
ALT: 18 U/L (ref 0–44)
AST: 14 U/L — ABNORMAL LOW (ref 15–41)
Albumin: 4 g/dL (ref 3.5–5.0)
Alkaline Phosphatase: 67 U/L (ref 38–126)
Anion gap: 7 (ref 5–15)
BUN: 33 mg/dL — ABNORMAL HIGH (ref 8–23)
CO2: 26 mmol/L (ref 22–32)
Calcium: 9.4 mg/dL (ref 8.9–10.3)
Chloride: 108 mmol/L (ref 98–111)
Creatinine, Ser: 1.67 mg/dL — ABNORMAL HIGH (ref 0.61–1.24)
GFR, EST AFRICAN AMERICAN: 49 mL/min — AB (ref >60)
GFR, EST NON AFRICAN AMERICAN: 42 mL/min — AB (ref >60)
Glucose, Bld: 86 mg/dL (ref 70–99)
Potassium: 4.3 mmol/L (ref 3.5–5.1)
SODIUM: 141 mmol/L (ref 135–145)
Total Bilirubin: 0.4 mg/dL (ref 0.3–1.2)
Total Protein: 6.3 g/dL — ABNORMAL LOW (ref 6.5–8.1)

## 2018-12-31 LAB — URINALYSIS, ROUTINE W REFLEX MICROSCOPIC
Bilirubin Urine: NEGATIVE
GLUCOSE, UA: NEGATIVE mg/dL
Hgb urine dipstick: NEGATIVE
Ketones, ur: NEGATIVE mg/dL
Leukocytes,Ua: NEGATIVE
Nitrite: NEGATIVE
Protein, ur: NEGATIVE mg/dL
Specific Gravity, Urine: 1.013 (ref 1.005–1.030)
pH: 5 (ref 5.0–8.0)

## 2018-12-31 LAB — I-STAT TROPONIN, ED: Troponin i, poc: 0 ng/mL (ref 0.00–0.08)

## 2018-12-31 LAB — CBG MONITORING, ED: Glucose-Capillary: 120 mg/dL — ABNORMAL HIGH (ref 70–99)

## 2018-12-31 MED ORDER — NYSTATIN 100000 UNIT/GM EX POWD: Freq: Four times a day (QID) | CUTANEOUS | 0 refills | Status: DC

## 2018-12-31 NOTE — ED Notes (Signed)
Bed: EA83 Expected date:  Expected time:  Means of arrival:  Comments: 68 yo generalized pain

## 2018-12-31 NOTE — ED Provider Notes (Signed)
[facility] DEPT Provider Note   CSN: [id number] Arrival date & time: 12/31/18  1018     History   Chief Complaint No chief complaint on file.   HPI [name] is a 68 y.o. male.  Patient is a 68 year old male with significant medical history including CLL, chronic kidney disease, CHF, non-Hodgkin's lymphoma, paranoid schizophrenia who lives in a facility and presenting today with vague symptoms of feeling like his nerves are bothering him especially in his groin.  He states he has been eating normally and had pancakes eggs and sausage this morning and has taken his morning medication.  He seems very sleepy on exam but states that his morning meds always make him tired.  He does not remember when his last bowel movement was but denies any abdominal pain, nausea or vomiting.  He denies any chest pain or shortness of breath.  He denies using drugs or alcohol.  He states his groin just does not feel right but denies dysuria.  Patient was just in the emergency room 5 days prior to this and had an extensive work-up with normal head CT, chest x-ray, EKG, troponin, ammonia.  Patient's CMP did show some mild AKI with a creatinine of 1.9 but short of that his labs were very reassuring.  Patient states he just does not feel right but cannot further elaborate.  During last evaluation it was felt that patient may have sciatica and he was started on meloxicam and Medrol Dosepak.  The history is provided by the patient and the EMS personnel.    Past Medical History:  Diagnosis Date  . Cellulitis   . CHF (congestive heart failure) ([place])   . Chronic kidney disease 09/20/2008  . Chronic lymphocytic leukemia ([place])   . cll dx'd 10/2003   no rx  . CLL (chronic lymphoblastic leukemia) 10/2003  . COPD (chronic obstructive pulmonary disease) ([place])   . GERD (gastroesophageal reflux disease)   . Hiatal hernia   . Hyperlipemia   . Hypertension   . Lower extremity edema     . NHL (non-Hodgkin's lymphoma) ([place]) dx'd 08/2008 rt groin   chemo comp 08/2008  . Non Hodgkin's lymphoma ([place]) 08/2008  . Non Hodgkin's lymphoma ([place])   . Paranoid schizophrenia ([place])   . Prolonged Q-T interval on ECG   . RBBB 09/17/2017  . Schizoaffective disorder ([place])   . Sepsis ([place])   . Severe left ventricular hypertrophy 10/21/2017  . Vitamin D deficiency     Patient Active Problem List   Diagnosis Date Noted  . Small bowel obstruction ([place]) 02/19/2018  . Cellulitis 12/02/2017  . Severe left ventricular hypertrophy 10/21/2017  . Stage II pressure injury of buttocks 10/03/2017  . Obesity (BMI 30-39.9) 10/03/2017  . Hyperlipidemia 10/03/2017  . Hypothyroidism 10/03/2017  . RBBB 09/17/2017  . Lobar pneumonia ([place]) 02/12/2016  . Cellulitis of right lower extremity 03/28/2015  . Sepsis ([place]) 03/27/2015  . Prolonged QT interval   . CLL (chronic lymphocytic leukemia) ([place]) 09/12/2013  . Non Hodgkin's lymphoma ([place]) 09/12/2013  . Leukocytosis 12/24/2012  . Anemia 12/24/2012  . CKD (chronic kidney disease), stage III ([place]) 12/24/2012  . Schizophrenia ([place]) 12/24/2012    Past Surgical History:  Procedure Laterality Date  . LYMPH NODE BIOPSY          Home Medications    Prior to Admission medications   Medication Sig Start Date End Date Taking? Authorizing Provider  acetaminophen (TYLENOL) 500 MG tablet Take 2 tablets (  1,000 mg total) by mouth every 6 (six) hours as needed. 07/20/18 07/20/19  Doreatha Lew, MD  albuterol (PROVENTIL HFA;VENTOLIN HFA) 108 (90 Base) MCG/ACT inhaler Inhale 2 puffs into the lungs every 4 (four) hours as needed for wheezing or shortness of breath.    [provider]  aspirin EC 81 MG tablet Take 81 mg by mouth daily.    [provider]  atorvastatin (LIPITOR) 20 MG tablet Take 1 tablet (20 mg total) by mouth daily. 10/03/18   Skeet Latch, MD  benztropine (COGENTIN) 0.5 MG tablet Take 1 tablet (0.5 mg total) by mouth  2 (two) times daily. 07/20/18   Doreatha Lew, MD  chlorproMAZINE (THORAZINE) 100 MG tablet Take 100 mg at bedtime by mouth.     [provider]  chlorproMAZINE (THORAZINE) 25 MG tablet Take 2 tablets (50 mg total) by mouth 3 (three) times daily. Patient not taking: Reported on 12/26/2018 07/20/18   Patrecia Pour, Christean Grief, MD  chlorproMAZINE (THORAZINE) 50 MG tablet Take 50 mg by mouth 3 (three) times daily. 12/14/18   [provider]  Cholecalciferol (VITAMIN D-3 PO) Take 5,000 mg by mouth daily.    [provider]  cloZAPine (CLOZARIL) 100 MG tablet Take 100 mg by mouth 2 (two) times daily.     [provider]  clozapine (CLOZARIL) 50 MG tablet Take 50 mg by mouth 2 (two) times daily.     [provider]  docusate sodium (COLACE) 100 MG capsule Take 100 mg by mouth daily.    [provider]  escitalopram (LEXAPRO) 10 MG tablet Take 10 mg by mouth daily before breakfast.     [provider]  lamoTRIgine (LAMICTAL) 25 MG tablet Take 50 mg by mouth 3 (three) times daily.     [provider]  levothyroxine (SYNTHROID, LEVOTHROID) 100 MCG tablet Take 100 mcg daily by mouth.     [provider]  meloxicam (MOBIC) 15 MG tablet Take 1 tablet (15 mg total) by mouth daily. Take 1 daily with food. 12/26/18   Margarita Mail, PA-C  methylPREDNISolone (MEDROL DOSEPAK) 4 MG TBPK tablet Use as directed 12/26/18   Margarita Mail, PA-C  metoprolol tartrate (LOPRESSOR) 25 MG tablet Take 0.5 tablets (12.5 mg total) by mouth 2 (two) times daily. 10/03/18 01/01/19  Skeet Latch, MD  Nystatin POWD Apply 1 application topically daily after breakfast. Apply to buttocks    [provider]  Omega-3 Fatty Acids (FISH OIL) 1000 MG CAPS Take 4,000 mg by mouth daily.    [provider]  polyethylene glycol (MIRALAX / GLYCOLAX) packet Take 17 g daily by mouth. Dissolve 17g in 8oz of water/juice and drink by mouth every day     [provider]  senna (SENOKOT) 8.6 MG TABS tablet Take 1 tablet by mouth daily.    [provider]    Family History Family History  Problem Relation Age of Onset  . Alcohol abuse Father     Social History Social History   Tobacco Use  . Smoking status: Never Smoker  . Smokeless tobacco: Never Used  Substance Use Topics  . Alcohol use: No  . Drug use: No     Allergies   Sulfamethoxazole   Review of Systems Review of Systems  All other systems reviewed and are negative.    Physical Exam Updated Vital Signs BP (!) 111/58 (BP Location: Right Arm)   Pulse 72   Temp 97.6 F (36.4 C) (Oral)  Resp 15   SpO2 98%   Physical Exam Vitals signs and nursing note reviewed.  Constitutional:      General: He is not in acute distress.    Appearance: He is well-developed.  HENT:     Head: Normocephalic and atraumatic.  Eyes:     Conjunctiva/sclera: Conjunctivae normal.     Pupils: Pupils are equal, round, and reactive to light.  Neck:     Musculoskeletal: Normal range of motion and neck supple.  Cardiovascular:     Rate and Rhythm: Normal rate and regular rhythm.     Heart sounds: No murmur.  Pulmonary:     Effort: Pulmonary effort is normal. No respiratory distress.     Breath sounds: Normal breath sounds. No wheezing or rales.  Abdominal:     General: There is no distension.     Palpations: Abdomen is soft.     Tenderness: There is no abdominal tenderness. There is no guarding or rebound.     Hernia: There is no hernia in the right inguinal area or left inguinal area.  Genitourinary:    Pubic Area: Rash present.     Penis: Normal and circumcised.      Scrotum/Testes: Normal.    Musculoskeletal: Normal range of motion.        General: No tenderness.  Skin:    General: Skin is warm and dry.     Findings: No erythema or rash.     Comments: No erythema of the extremities  Neurological:     Mental Status: He is oriented to person, place,  and time.     Cranial Nerves: Cranial nerves are intact.     Sensory: Sensation is intact.     Motor: Motor function is intact.     Comments: Flat affect and very sleepy on exam but will wake up  Psychiatric:        Mood and Affect: Affect is flat.        Behavior: Behavior normal.      ED Treatments / Results  Labs (all labs ordered are listed, but only abnormal results are displayed) Labs Reviewed  CBC WITH DIFFERENTIAL/PLATELET - Abnormal; Notable for the following components:      Result Value   WBC 13.2 (*)    Neutro Abs 10.2 (*)    All other components within normal limits  COMPREHENSIVE METABOLIC PANEL - Abnormal; Notable for the following components:   BUN 33 (*)    Creatinine, Ser 1.67 (*)    Total Protein 6.3 (*)    AST 14 (*)    GFR calc non Af Amer 42 (*)    GFR calc Af Amer 49 (*)    All other components within normal limits  CBG MONITORING, ED - Abnormal; Notable for the following components:   Glucose-Capillary 120 (*)    All other components within normal limits  URINE CULTURE  URINALYSIS, ROUTINE W REFLEX MICROSCOPIC  I-STAT TROPONIN, ED    EKG None  Radiology No results found.  Procedures Procedures (including critical care time)  Medications Ordered in ED Medications - No data to display   Initial Impression / Assessment and Plan / ED Course  I have reviewed the triage vital signs and the nursing notes.  Pertinent labs & imaging results that were available during my care of the patient were reviewed by me and considered in my medical decision making (see chart for details).     Patient presenting today with a vague history of  nerve pain all over especially in the groin.  Patient is very sleepy on exam but otherwise appears comfortable.  His vital signs are normal.  He is not febrile and has no history of fever.  He has no abdominal pain on exam and denies any chest pain or shortness of breath.  5 days ago patient had ammonia levels checked,  head CT, chest x-ray, blood work and troponin which were all within normal limits except for some mild AKI.  Patient's mental status may be due to his medication as he does take Thorazine in the morning.  He does not take any narcotic medications.  He does not appear to be septic or have cellulitis at this time.  He has no evidence of scrotal involvement, abscess or Fournier's.  He is not a drinker.  Low suspicion for stroke at this time.  We will repeat baseline labs to ensure since he was placed on meloxicam and Medrol Dosepak that he is not hyperglycemic or worsening renal function.  EKG without acute findings.  2:38 PM Patient does have a leukocytosis today which is changed from prior but he is also been on Medrol.  His troponin, urinalysis and CMP are all at baseline.  His creatinine is improved from a few days ago.  In speaking with the patient he keeps stating there is a problem with his nerves but he does admit to being depressed and cannot watch the news.  He does not like where he lives and would like to live somewhere different.  He repeatedly says the same thing over and over again and has extremely flat affect.  However when asked if he feels like his depression has gotten worse he states it is actually seemed a little bit better and he does enjoy checking his mail and writing letters to family members.  Concerned that some of patient's symptoms are related to oversedation from medication.  Feel he would benefit from evaluation and possible some medication discontinuation.  Patient does have candidal infection and stage I pressure ulcer on his buttocks.  He was given nystatin powder.  2:52 PM Attempted to contact patient's legal guardian but both phone numbers nobody responded.  Feel he is safe to return to his facility.  Final Clinical Impressions(s) / ED Diagnoses   Final diagnoses:  Tinea cruris  Polypharmacy    ED Discharge Orders         Ordered    nystatin (MYCOSTATIN/NYSTOP)  powder  4 times daily     12/31/18 1453           Blanchie Dessert, MD 12/31/18 1458

## 2018-12-31 NOTE — Discharge Instructions (Signed)
Concern that [name] is over medicated and may benefit from reduction in some of his medications.  No signs of infection today or other problems except for yeast infection in his groin from wearing depends.  He can use nystatin for this.

## 2018-12-31 NOTE — ED Triage Notes (Signed)
Per EMS, pt from [facility].  Pt states "all over nerve pain and especially in groin area".  Pt took morning meds but refused tylenol at facility.  No injury noted.  Was here on Monday for a different complaint.  Pt has been acting normal so far today.  Ate breakfast.  Some mental delay at baseline.  EMS bp 94 palpated, hr 85, 96% ra, cbg 211

## 2019-01-02 LAB — URINE CULTURE: Culture: <10000 — AB

## 2019-02-24 ENCOUNTER — Telehealth: Payer: Self-pay | Admitting: Cardiovascular Disease

## 2019-02-24 NOTE — Telephone Encounter (Signed)
Okay.  Noted. Thank you

## 2019-02-24 NOTE — Telephone Encounter (Signed)
Spoke to Legal Guardian, Gildardo Pounds, to discuss telephone visit for pt on 4/14 with Dr. Claiborne Billings. She stated it would be best to wait for in office visit because patient would not understand.   Appointment rescheduled to 06/21/19 at 11:20 AM with Dr. Claiborne Billings.

## 2019-02-28 ENCOUNTER — Ambulatory Visit: Payer: Medicare Other | Admitting: Cardiovascular Disease

## 2019-06-21 ENCOUNTER — Ambulatory Visit: Payer: Medicare Other | Admitting: Cardiovascular Disease

## 2019-10-25 ENCOUNTER — Inpatient Hospital Stay (HOSPITAL_COMMUNITY)
Admission: EM | Admit: 2019-10-25 | Discharge: 2019-11-28 | DRG: 177 | Disposition: A | Discharge status: Skilled Nursing Facility | Payer: Medicare Other | Attending: Internal Medicine | Admitting: Internal Medicine

## 2019-10-25 ENCOUNTER — Emergency Department (HOSPITAL_COMMUNITY): Payer: Medicare Other

## 2019-10-25 DIAGNOSIS — E875 Hyperkalemia: Secondary | ICD-10-CM | POA: Diagnosis not present

## 2019-10-25 DIAGNOSIS — N139 Obstructive and reflux uropathy, unspecified: Secondary | ICD-10-CM | POA: Diagnosis not present

## 2019-10-25 DIAGNOSIS — D631 Anemia in chronic kidney disease: Secondary | ICD-10-CM | POA: Diagnosis present

## 2019-10-25 DIAGNOSIS — Z79890 Hormone replacement therapy (postmenopausal): Secondary | ICD-10-CM

## 2019-10-25 DIAGNOSIS — F259 Schizoaffective disorder, unspecified: Secondary | ICD-10-CM | POA: Diagnosis present

## 2019-10-25 DIAGNOSIS — L89322 Pressure ulcer of left buttock, stage 2: Secondary | ICD-10-CM | POA: Diagnosis present

## 2019-10-25 DIAGNOSIS — J440 Chronic obstructive pulmonary disease with acute lower respiratory infection: Secondary | ICD-10-CM | POA: Diagnosis present

## 2019-10-25 DIAGNOSIS — Y92230 Patient room in hospital as the place of occurrence of the external cause: Secondary | ICD-10-CM | POA: Diagnosis not present

## 2019-10-25 DIAGNOSIS — G9341 Metabolic encephalopathy: Secondary | ICD-10-CM | POA: Diagnosis not present

## 2019-10-25 DIAGNOSIS — Z791 Long term (current) use of non-steroidal anti-inflammatories (NSAID): Secondary | ICD-10-CM

## 2019-10-25 DIAGNOSIS — Z781 Physical restraint status: Secondary | ICD-10-CM

## 2019-10-25 DIAGNOSIS — R338 Other retention of urine: Secondary | ICD-10-CM | POA: Diagnosis not present

## 2019-10-25 DIAGNOSIS — I5032 Chronic diastolic (congestive) heart failure: Secondary | ICD-10-CM | POA: Diagnosis present

## 2019-10-25 DIAGNOSIS — E870 Hyperosmolality and hypernatremia: Secondary | ICD-10-CM | POA: Diagnosis not present

## 2019-10-25 DIAGNOSIS — F209 Schizophrenia, unspecified: Secondary | ICD-10-CM | POA: Diagnosis present

## 2019-10-25 DIAGNOSIS — N410 Acute prostatitis: Secondary | ICD-10-CM | POA: Diagnosis not present

## 2019-10-25 DIAGNOSIS — Z751 Person awaiting admission to adequate facility elsewhere: Secondary | ICD-10-CM

## 2019-10-25 DIAGNOSIS — J1282 Pneumonia due to coronavirus disease 2019: Secondary | ICD-10-CM | POA: Diagnosis not present

## 2019-10-25 DIAGNOSIS — Z9221 Personal history of antineoplastic chemotherapy: Secondary | ICD-10-CM

## 2019-10-25 DIAGNOSIS — N184 Chronic kidney disease, stage 4 (severe): Secondary | ICD-10-CM | POA: Diagnosis present

## 2019-10-25 DIAGNOSIS — J9601 Acute respiratory failure with hypoxia: Secondary | ICD-10-CM | POA: Diagnosis not present

## 2019-10-25 DIAGNOSIS — Y738 Miscellaneous gastroenterology and urology devices associated with adverse incidents, not elsewhere classified: Secondary | ICD-10-CM | POA: Diagnosis not present

## 2019-10-25 DIAGNOSIS — J1289 Other viral pneumonia: Secondary | ICD-10-CM | POA: Diagnosis not present

## 2019-10-25 DIAGNOSIS — N138 Other obstructive and reflux uropathy: Secondary | ICD-10-CM | POA: Diagnosis not present

## 2019-10-25 DIAGNOSIS — Z79899 Other long term (current) drug therapy: Secondary | ICD-10-CM

## 2019-10-25 DIAGNOSIS — A419 Sepsis, unspecified organism: Secondary | ICD-10-CM | POA: Diagnosis not present

## 2019-10-25 DIAGNOSIS — E785 Hyperlipidemia, unspecified: Secondary | ICD-10-CM | POA: Diagnosis present

## 2019-10-25 DIAGNOSIS — T50905A Adverse effect of unspecified drugs, medicaments and biological substances, initial encounter: Secondary | ICD-10-CM | POA: Diagnosis not present

## 2019-10-25 DIAGNOSIS — D696 Thrombocytopenia, unspecified: Secondary | ICD-10-CM | POA: Diagnosis present

## 2019-10-25 DIAGNOSIS — I4581 Long QT syndrome: Secondary | ICD-10-CM | POA: Diagnosis present

## 2019-10-25 DIAGNOSIS — F203 Undifferentiated schizophrenia: Secondary | ICD-10-CM | POA: Diagnosis not present

## 2019-10-25 DIAGNOSIS — Z8619 Personal history of other infectious and parasitic diseases: Secondary | ICD-10-CM

## 2019-10-25 DIAGNOSIS — R251 Tremor, unspecified: Secondary | ICD-10-CM | POA: Diagnosis present

## 2019-10-25 DIAGNOSIS — N419 Inflammatory disease of prostate, unspecified: Secondary | ICD-10-CM | POA: Diagnosis not present

## 2019-10-25 DIAGNOSIS — F200 Paranoid schizophrenia: Secondary | ICD-10-CM | POA: Diagnosis not present

## 2019-10-25 DIAGNOSIS — E039 Hypothyroidism, unspecified: Secondary | ICD-10-CM | POA: Diagnosis present

## 2019-10-25 DIAGNOSIS — L298 Other pruritus: Secondary | ICD-10-CM | POA: Diagnosis not present

## 2019-10-25 DIAGNOSIS — Z8572 Personal history of non-Hodgkin lymphomas: Secondary | ICD-10-CM

## 2019-10-25 DIAGNOSIS — N390 Urinary tract infection, site not specified: Secondary | ICD-10-CM | POA: Diagnosis not present

## 2019-10-25 DIAGNOSIS — N179 Acute kidney failure, unspecified: Secondary | ICD-10-CM | POA: Diagnosis not present

## 2019-10-25 DIAGNOSIS — R339 Retention of urine, unspecified: Secondary | ICD-10-CM | POA: Diagnosis not present

## 2019-10-25 DIAGNOSIS — U071 COVID-19: Principal | ICD-10-CM

## 2019-10-25 DIAGNOSIS — T83021A Displacement of indwelling urethral catheter, initial encounter: Secondary | ICD-10-CM | POA: Diagnosis not present

## 2019-10-25 DIAGNOSIS — F341 Dysthymic disorder: Secondary | ICD-10-CM | POA: Diagnosis not present

## 2019-10-25 DIAGNOSIS — R6521 Severe sepsis with septic shock: Secondary | ICD-10-CM | POA: Diagnosis not present

## 2019-10-25 DIAGNOSIS — J1281 Pneumonia due to SARS-associated coronavirus: Secondary | ICD-10-CM | POA: Diagnosis not present

## 2019-10-25 DIAGNOSIS — N50811 Right testicular pain: Secondary | ICD-10-CM | POA: Diagnosis not present

## 2019-10-25 DIAGNOSIS — I130 Hypertensive heart and chronic kidney disease with heart failure and stage 1 through stage 4 chronic kidney disease, or unspecified chronic kidney disease: Secondary | ICD-10-CM | POA: Diagnosis present

## 2019-10-25 DIAGNOSIS — I129 Hypertensive chronic kidney disease with stage 1 through stage 4 chronic kidney disease, or unspecified chronic kidney disease: Secondary | ICD-10-CM | POA: Diagnosis not present

## 2019-10-25 DIAGNOSIS — R52 Pain, unspecified: Secondary | ICD-10-CM

## 2019-10-25 DIAGNOSIS — Z7982 Long term (current) use of aspirin: Secondary | ICD-10-CM

## 2019-10-25 DIAGNOSIS — B356 Tinea cruris: Secondary | ICD-10-CM | POA: Diagnosis not present

## 2019-10-25 DIAGNOSIS — N50812 Left testicular pain: Secondary | ICD-10-CM | POA: Diagnosis not present

## 2019-10-25 DIAGNOSIS — Z6833 Body mass index (BMI) 33.0-33.9, adult: Secondary | ICD-10-CM

## 2019-10-25 LAB — CBC WITH DIFFERENTIAL/PLATELET
Abs Immature Granulocytes: 0.04 10*3/uL (ref 0.00–0.07)
Basophils Absolute: 0 10*3/uL (ref 0.0–0.1)
Basophils Relative: 0 %
Eosinophils Absolute: 0 10*3/uL (ref 0.0–0.5)
Eosinophils Relative: 0 %
HCT: 34.3 % — ABNORMAL LOW (ref 39.0–52.0)
Hemoglobin: 10.8 g/dL — ABNORMAL LOW (ref 13.0–17.0)
Immature Granulocytes: 1 %
Lymphocytes Relative: 15 %
Lymphs Abs: 0.9 10*3/uL (ref 0.7–4.0)
MCH: 31.4 pg (ref 26.0–34.0)
MCHC: 31.5 g/dL (ref 30.0–36.0)
MCV: 99.7 fL (ref 80.0–100.0)
Monocytes Absolute: 0.6 10*3/uL (ref 0.1–1.0)
Monocytes Relative: 9 %
Neutro Abs: 4.7 10*3/uL (ref 1.7–7.7)
Neutrophils Relative %: 75 %
Platelets: 126 10*3/uL — ABNORMAL LOW (ref 150–400)
RBC: 3.44 MIL/uL — ABNORMAL LOW (ref 4.22–5.81)
RDW: 14.5 % (ref 11.5–15.5)
WBC: 6.3 10*3/uL (ref 4.0–10.5)
nRBC: 0 % (ref 0.0–0.2)

## 2019-10-25 LAB — COMPREHENSIVE METABOLIC PANEL
ALT: 13 U/L (ref 0–44)
AST: 24 U/L (ref 15–41)
Albumin: 3.2 g/dL — ABNORMAL LOW (ref 3.5–5.0)
Alkaline Phosphatase: 52 U/L (ref 38–126)
Anion gap: 10 (ref 5–15)
BUN: 46 mg/dL — ABNORMAL HIGH (ref 8–23)
CO2: 25 mmol/L (ref 22–32)
Calcium: 8.6 mg/dL — ABNORMAL LOW (ref 8.9–10.3)
Chloride: 110 mmol/L (ref 98–111)
Creatinine, Ser: 3.09 mg/dL — ABNORMAL HIGH (ref 0.61–1.24)
GFR calc Af Amer: 23 mL/min — ABNORMAL LOW (ref >60)
GFR calc non Af Amer: 20 mL/min — ABNORMAL LOW (ref >60)
Glucose, Bld: 128 mg/dL — ABNORMAL HIGH (ref 70–99)
Potassium: 5.1 mmol/L (ref 3.5–5.1)
Sodium: 145 mmol/L (ref 135–145)
Total Bilirubin: 0.5 mg/dL (ref 0.3–1.2)
Total Protein: 5.9 g/dL — ABNORMAL LOW (ref 6.5–8.1)

## 2019-10-25 LAB — ETHANOL: Alcohol, Ethyl (B): <10 mg/dL (ref <10)

## 2019-10-25 LAB — POC SARS CORONAVIRUS 2 AG -  ED: SARS Coronavirus 2 Ag: POSITIVE — AB

## 2019-10-25 LAB — LIPASE, BLOOD: Lipase: 29 U/L (ref 11–51)

## 2019-10-25 MED ORDER — CLOZAPINE 100 MG PO TABS
100.0000 mg | ORAL_TABLET | Freq: Two times a day (BID) | ORAL | Status: DC
  Administered 2019-10-26 (×3): 100 mg via ORAL
  Filled 2019-10-25 (×4): qty 1

## 2019-10-25 MED ORDER — DOXYCYCLINE HYCLATE 100 MG PO TABS
100.0000 mg | ORAL_TABLET | Freq: Two times a day (BID) | ORAL | Status: DC
  Administered 2019-10-26 – 2019-10-30 (×10): 100 mg via ORAL
  Filled 2019-10-25 (×10): qty 1

## 2019-10-25 MED ORDER — SENNA 8.6 MG PO TABS
1.0000 | ORAL_TABLET | Freq: Every day | ORAL | Status: DC
  Administered 2019-10-26 – 2019-11-28 (×34): 8.6 mg via ORAL
  Filled 2019-10-25 (×35): qty 1

## 2019-10-25 MED ORDER — FLUTICASONE PROPIONATE HFA 220 MCG/ACT IN AERO
1.0000 | INHALATION_SPRAY | Freq: Two times a day (BID) | RESPIRATORY_TRACT | Status: DC
  Administered 2019-10-26 – 2019-11-28 (×56): 1 via RESPIRATORY_TRACT
  Filled 2019-10-25 (×4): qty 12

## 2019-10-25 MED ORDER — SODIUM CHLORIDE 0.9 % IV SOLN
200.0000 mg | Freq: Once | INTRAVENOUS | Status: AC
  Administered 2019-10-26: 200 mg via INTRAVENOUS
  Filled 2019-10-25: qty 200

## 2019-10-25 MED ORDER — CHLORPROMAZINE HCL 50 MG PO TABS
50.0000 mg | ORAL_TABLET | Freq: Three times a day (TID) | ORAL | Status: DC
  Administered 2019-10-26 – 2019-10-27 (×3): 50 mg via ORAL
  Filled 2019-10-25: qty 2
  Filled 2019-10-25 (×2): qty 1
  Filled 2019-10-25: qty 2
  Filled 2019-10-25 (×2): qty 1

## 2019-10-25 MED ORDER — ASPIRIN EC 81 MG PO TBEC
81.0000 mg | DELAYED_RELEASE_TABLET | Freq: Every day | ORAL | Status: DC
  Administered 2019-10-26 – 2019-11-28 (×34): 81 mg via ORAL
  Filled 2019-10-25 (×35): qty 1

## 2019-10-25 MED ORDER — CHLORPROMAZINE HCL 50 MG PO TABS
100.0000 mg | ORAL_TABLET | Freq: Every day | ORAL | Status: DC
  Administered 2019-10-26 (×2): 100 mg via ORAL
  Filled 2019-10-25: qty 2
  Filled 2019-10-25: qty 4
  Filled 2019-10-25: qty 2

## 2019-10-25 MED ORDER — ESCITALOPRAM OXALATE 10 MG PO TABS
10.0000 mg | ORAL_TABLET | Freq: Every day | ORAL | Status: DC
  Administered 2019-10-26 – 2019-11-28 (×34): 10 mg via ORAL
  Filled 2019-10-25 (×37): qty 1

## 2019-10-25 MED ORDER — SODIUM CHLORIDE 0.9 % IV SOLN
100.0000 mg | Freq: Every day | INTRAVENOUS | Status: AC
  Administered 2019-10-26 – 2019-10-29 (×4): 100 mg via INTRAVENOUS
  Filled 2019-10-25 (×4): qty 100

## 2019-10-25 MED ORDER — SODIUM CHLORIDE 0.9 % IV SOLN: 200.0000 mg | Freq: Once | INTRAVENOUS | Status: DC

## 2019-10-25 MED ORDER — ADULT MULTIVITAMIN W/MINERALS CH
1.0000 | ORAL_TABLET | Freq: Every day | ORAL | Status: DC
  Administered 2019-10-26 – 2019-11-28 (×32): 1 via ORAL
  Filled 2019-10-25 (×34): qty 1

## 2019-10-25 MED ORDER — THIAMINE HCL 100 MG PO TABS
100.0000 mg | ORAL_TABLET | Freq: Every day | ORAL | Status: DC
  Administered 2019-10-26 – 2019-11-28 (×34): 100 mg via ORAL
  Filled 2019-10-25 (×35): qty 1

## 2019-10-25 MED ORDER — NYSTATIN 100000 UNIT/GM EX POWD
1.0000 | Freq: Every day | CUTANEOUS | Status: DC
  Administered 2019-10-27 – 2019-11-28 (×33): 1 via TOPICAL
  Filled 2019-10-25 (×3): qty 15

## 2019-10-25 MED ORDER — ENOXAPARIN SODIUM 30 MG/0.3ML ~~LOC~~ SOLN
30.0000 mg | Freq: Every day | SUBCUTANEOUS | Status: DC
  Administered 2019-10-26 (×2): 30 mg via SUBCUTANEOUS
  Filled 2019-10-25 (×2): qty 0.3

## 2019-10-25 MED ORDER — SODIUM CHLORIDE 0.9 % IV SOLN: 100.0000 mg | Freq: Every day | INTRAVENOUS | Status: DC

## 2019-10-25 MED ORDER — LAMOTRIGINE 25 MG PO TABS
50.0000 mg | ORAL_TABLET | Freq: Three times a day (TID) | ORAL | Status: DC
  Administered 2019-10-26 – 2019-11-10 (×46): 50 mg via ORAL
  Filled 2019-10-25 (×47): qty 2

## 2019-10-25 MED ORDER — LEVOTHYROXINE SODIUM 100 MCG PO TABS
100.0000 ug | ORAL_TABLET | Freq: Every day | ORAL | Status: DC
  Administered 2019-10-26 – 2019-11-09 (×15): 100 ug via ORAL
  Filled 2019-10-25 (×17): qty 1

## 2019-10-25 MED ORDER — ATORVASTATIN CALCIUM 20 MG PO TABS
20.0000 mg | ORAL_TABLET | Freq: Every day | ORAL | Status: DC
  Administered 2019-10-27 – 2019-11-27 (×30): 20 mg via ORAL
  Filled 2019-10-25: qty 1
  Filled 2019-10-25: qty 2
  Filled 2019-10-25 (×10): qty 1
  Filled 2019-10-25: qty 2
  Filled 2019-10-25: qty 1
  Filled 2019-10-25: qty 2
  Filled 2019-10-25 (×16): qty 1

## 2019-10-25 MED ORDER — IPRATROPIUM-ALBUTEROL 20-100 MCG/ACT IN AERS
1.0000 | INHALATION_SPRAY | Freq: Four times a day (QID) | RESPIRATORY_TRACT | Status: DC
  Administered 2019-10-26 – 2019-11-02 (×30): 1 via RESPIRATORY_TRACT
  Filled 2019-10-25 (×2): qty 4

## 2019-10-25 MED ORDER — VITAMIN D 25 MCG (1000 UNIT) PO TABS
5000.0000 [IU] | ORAL_TABLET | Freq: Every day | ORAL | Status: DC
  Administered 2019-10-26 – 2019-11-28 (×32): 5000 [IU] via ORAL
  Filled 2019-10-25 (×32): qty 5

## 2019-10-25 MED ORDER — POLYETHYLENE GLYCOL 3350 17 G PO PACK
17.0000 g | PACK | Freq: Every day | ORAL | Status: DC
  Administered 2019-10-26 – 2019-11-28 (×28): 17 g via ORAL
  Filled 2019-10-25 (×32): qty 1

## 2019-10-25 MED ORDER — ZINC SULFATE 220 (50 ZN) MG PO CAPS
220.0000 mg | ORAL_CAPSULE | Freq: Every day | ORAL | Status: DC
  Administered 2019-10-26 – 2019-11-28 (×35): 220 mg via ORAL
  Filled 2019-10-25 (×36): qty 1

## 2019-10-25 MED ORDER — DOCUSATE SODIUM 100 MG PO CAPS
100.0000 mg | ORAL_CAPSULE | Freq: Every day | ORAL | Status: DC
  Administered 2019-10-26 – 2019-11-28 (×33): 100 mg via ORAL
  Filled 2019-10-25 (×34): qty 1

## 2019-10-25 MED ORDER — SODIUM CHLORIDE 0.9 % IV SOLN
1.0000 g | INTRAVENOUS | Status: DC
  Administered 2019-10-26 – 2019-10-29 (×5): 1 g via INTRAVENOUS
  Filled 2019-10-25: qty 1
  Filled 2019-10-25: qty 10
  Filled 2019-10-25: qty 1
  Filled 2019-10-25: qty 10
  Filled 2019-10-25 (×2): qty 1

## 2019-10-25 MED ORDER — FOLIC ACID 1 MG PO TABS
1.0000 mg | ORAL_TABLET | Freq: Every day | ORAL | Status: DC
  Administered 2019-10-26 – 2019-11-28 (×34): 1 mg via ORAL
  Filled 2019-10-25 (×34): qty 1

## 2019-10-25 MED ORDER — DEXAMETHASONE 4 MG PO TABS
6.0000 mg | ORAL_TABLET | ORAL | Status: DC
  Administered 2019-10-26: 6 mg via ORAL
  Filled 2019-10-25: qty 1

## 2019-10-25 MED ORDER — BENZTROPINE MESYLATE 0.5 MG PO TABS
0.5000 mg | ORAL_TABLET | Freq: Two times a day (BID) | ORAL | Status: DC
  Administered 2019-10-26 – 2019-11-10 (×32): 0.5 mg via ORAL
  Filled 2019-10-25 (×32): qty 1

## 2019-10-25 MED ORDER — METOPROLOL TARTRATE 25 MG PO TABS
12.5000 mg | ORAL_TABLET | Freq: Two times a day (BID) | ORAL | Status: DC
  Administered 2019-10-26: 12.5 mg via ORAL
  Filled 2019-10-25: qty 1

## 2019-10-25 MED ORDER — ASCORBIC ACID 500 MG PO TABS
500.0000 mg | ORAL_TABLET | Freq: Every day | ORAL | Status: DC
  Administered 2019-10-26 – 2019-11-28 (×33): 500 mg via ORAL
  Filled 2019-10-25 (×33): qty 1

## 2019-10-25 MED ORDER — CLOZAPINE 25 MG PO TABS
50.0000 mg | ORAL_TABLET | Freq: Two times a day (BID) | ORAL | Status: DC
  Administered 2019-10-26 (×3): 50 mg via ORAL
  Filled 2019-10-25 (×4): qty 2

## 2019-10-25 NOTE — ED Provider Notes (Signed)
[facility] DEPT Provider Note   CSN: [id number] Arrival date & time: 10/25/19  8676     History   Chief Complaint No chief complaint on file.   HPI [name] is a 68 y.o. male.     HPI Patient presents from [facility] with staff concerns of the patient's shakiness.  Line the patient self is poor historian, repetitive, cannot specify how he is feeling, but seems to deny pain.  Patient offers a rambling account of why he is here, his history.  Level 5 caveat secondary to psychiatric disease, as he has a history of schizoaffective disorder. EMS reports the patient was found to be 89% on room air, improved with 3 L nasal cannula No reported fever, vomiting, fall. Past Medical History:  Diagnosis Date  . Cellulitis   . CHF (congestive heart failure) ([place])   . Chronic kidney disease 09/20/2008  . Chronic lymphocytic leukemia ([place])   . cll dx'd 10/2003   no rx  . CLL (chronic lymphoblastic leukemia) 10/2003  . COPD (chronic obstructive pulmonary disease) ([place])   . GERD (gastroesophageal reflux disease)   . Hiatal hernia   . Hyperlipemia   . Hypertension   . Lower extremity edema   . NHL (non-Hodgkin's lymphoma) ([place]) dx'd 08/2008 rt groin   chemo comp 08/2008  . Non Hodgkin's lymphoma ([place]) 08/2008  . Non Hodgkin's lymphoma ([place])   . Paranoid schizophrenia ([place])   . Prolonged Q-T interval on ECG   . RBBB 09/17/2017  . Schizoaffective disorder ([place])   . Sepsis ([place])   . Severe left ventricular hypertrophy 10/21/2017  . Vitamin D deficiency     Patient Active Problem List   Diagnosis Date Noted  . Small bowel obstruction ([place]) 02/19/2018  . Cellulitis 12/02/2017  . Severe left ventricular hypertrophy 10/21/2017  . Stage II pressure injury of buttocks 10/03/2017  . Obesity (BMI 30-39.9) 10/03/2017  . Hyperlipidemia 10/03/2017  . Hypothyroidism 10/03/2017  . RBBB 09/17/2017  . Lobar pneumonia ([place]) 02/12/2016  .  Cellulitis of right lower extremity 03/28/2015  . Sepsis ([place]) 03/27/2015  . Prolonged QT interval   . CLL (chronic lymphocytic leukemia) ([place]) 09/12/2013  . Non Hodgkin's lymphoma ([place]) 09/12/2013  . Leukocytosis 12/24/2012  . Anemia 12/24/2012  . CKD (chronic kidney disease), stage III 12/24/2012  . Schizophrenia ([place]) 12/24/2012    Past Surgical History:  Procedure Laterality Date  . LYMPH NODE BIOPSY          Home Medications    Prior to Admission medications   Medication Sig Start Date End Date Taking? Authorizing Provider  acetaminophen (TYLENOL) 500 MG tablet Take 1,000 mg by mouth every 6 (six) hours as needed for mild pain.   Yes [provider]  albuterol (PROVENTIL HFA;VENTOLIN HFA) 108 (90 Base) MCG/ACT inhaler Inhale 2 puffs into the lungs every 4 (four) hours as needed for wheezing or shortness of breath.   Yes [provider]  aspirin EC 81 MG tablet Take 81 mg by mouth daily.   Yes [provider]  atorvastatin (LIPITOR) 20 MG tablet Take 1 tablet (20 mg total) by mouth daily. 10/03/18  Yes Skeet Latch, MD  benztropine (COGENTIN) 0.5 MG tablet Take 1 tablet (0.5 mg total) by mouth 2 (two) times daily. 07/20/18  Yes Patrecia Pour, Christean Grief, MD  chlorproMAZINE (THORAZINE) 100 MG tablet Take 100 mg at bedtime by mouth.    Yes [provider]  chlorproMAZINE (THORAZINE) 50 MG tablet  Take 50 mg by mouth 3 (three) times daily. 12/14/18  Yes [provider]  Cholecalciferol (DIALYVITE VITAMIN D 5000) 125 MCG (5000 UT) capsule Take 5,000 Units by mouth daily.   Yes [provider]  cloZAPine (CLOZARIL) 100 MG tablet Take 100 mg by mouth 2 (two) times daily.    Yes [provider]  clozapine (CLOZARIL) 50 MG tablet Take 50 mg by mouth 2 (two) times daily.    Yes [provider]  docusate sodium (COLACE) 100 MG capsule Take 100 mg by mouth daily.   Yes [provider]  escitalopram (LEXAPRO) 10 MG  tablet Take 10 mg by mouth daily before breakfast.    Yes [provider]  ketoconazole (NIZORAL) 2 % cream Apply 1 application topically daily. Apply small amount to groin area for 1 week, then repeat as necessary.   Yes [provider]  lamoTRIgine (LAMICTAL) 25 MG tablet Take 50 mg by mouth 3 (three) times daily.    Yes [provider]  levothyroxine (SYNTHROID, LEVOTHROID) 100 MCG tablet Take 100 mcg daily by mouth.    Yes [provider]  meloxicam (MOBIC) 15 MG tablet Take 1 tablet (15 mg total) by mouth daily. Take 1 daily with food. 12/26/18  Yes Harris, Abigail, PA-C  metoprolol tartrate (LOPRESSOR) 25 MG tablet Take 0.5 tablets (12.5 mg total) by mouth 2 (two) times daily. 10/03/18 10/25/19 Yes Skeet Latch, MD  nystatin (MYCOSTATIN/NYSTOP) powder Apply topically 4 (four) times daily. Patient taking differently: Apply 1 Bottle topically daily. Apply to buttocks daily. 12/31/18  Yes Plunkett, Loree Fee, MD  Omega-3 Fatty Acids (FISH OIL) 1000 MG CAPS Take 4,000 mg by mouth daily.   Yes [provider]  polyethylene glycol (MIRALAX / GLYCOLAX) packet Take 17 g daily by mouth. Dissolve 17g in 8oz of water/juice and drink by mouth every day   Yes [provider]  senna (SENOKOT) 8.6 MG TABS tablet Take 1 tablet by mouth daily.   Yes [provider]  chlorproMAZINE (THORAZINE) 25 MG tablet Take 2 tablets (50 mg total) by mouth 3 (three) times daily. Patient not taking: Reported on 12/26/2018 07/20/18   Patrecia Pour, Christean Grief, MD  methylPREDNISolone (MEDROL DOSEPAK) 4 MG TBPK tablet Use as directed Patient not taking: Reported on 10/25/2019 12/26/18   Margarita Mail, PA-C    Family History Family History  Problem Relation Age of Onset  . Alcohol abuse Father     Social History Social History   Tobacco Use  . Smoking status: Never Smoker  . Smokeless tobacco: Never Used  Substance Use Topics  . Alcohol use: No  . Drug use: No      Allergies   Sulfamethoxazole   Review of Systems Review of Systems  Unable to perform ROS: Psychiatric disorder     Physical Exam Updated Vital Signs BP 119/76 (BP Location: Left Arm)   Pulse 97   Temp 98.2 F (36.8 C) (Oral)   Resp (!) 28   SpO2 96%   Physical Exam Vitals signs and nursing note reviewed.  Constitutional:      Appearance: He is well-developed. He is obese.  HENT:     Head: Normocephalic and atraumatic.  Eyes:     Conjunctiva/sclera: Conjunctivae normal.  Cardiovascular:     Rate and Rhythm: Normal rate and regular rhythm.  Pulmonary:     Effort: Pulmonary effort is normal. No respiratory distress.     Breath sounds: No stridor.  Abdominal:  General: There is no distension.  Skin:    General: Skin is warm and dry.  Neurological:     Mental Status: He is alert.     Comments: Mild atrophy diffusely, but the patient moves all, spontaneously, and occasionally to command. No gross facial asymmetry, speech is brief, clear, but distant replies to question.  Psychiatric:        Mood and Affect: Affect is labile.        Behavior: Behavior is slowed and withdrawn.        Cognition and Memory: Cognition is impaired.      ED Treatments / Results  Labs (all labs ordered are listed, but only abnormal results are displayed) Labs Reviewed  COMPREHENSIVE METABOLIC PANEL - Abnormal; Notable for the following components:      Result Value   Glucose, Bld 128 (*)    BUN 46 (*)    Creatinine, Ser 3.09 (*)    Calcium 8.6 (*)    Total Protein 5.9 (*)    Albumin 3.2 (*)    GFR calc non Af Amer 20 (*)    GFR calc Af Amer 23 (*)    All other components within normal limits  CBC WITH DIFFERENTIAL/PLATELET - Abnormal; Notable for the following components:   RBC 3.44 (*)    Hemoglobin 10.8 (*)    HCT 34.3 (*)    Platelets 126 (*)    All other components within normal limits  POC SARS CORONAVIRUS 2 AG -  ED - Abnormal; Notable for the following  components:   SARS Coronavirus 2 Ag POSITIVE (*)    All other components within normal limits  ETHANOL  LIPASE, BLOOD  URINALYSIS, ROUTINE W REFLEX MICROSCOPIC    EKG None  Radiology Dg Chest Port 1 View  Result Date: 10/25/2019 CLINICAL DATA:  Altered level of consciousness. EXAM: PORTABLE CHEST 1 VIEW COMPARISON:  12/26/2018 FINDINGS: Normal heart size. No pleural effusion or edema identified. Bilateral airspace opacities are identified involving both mid and lower lung zones. IMPRESSION: 1. Bilateral airspace opacities are identified concerning for multifocal infection. Electronically Signed   By: Kerby Moors M.D.   On: 10/25/2019 19:25    Procedures Procedures (including critical care time)  Medications Ordered in ED Medications - No data to display   Initial Impression / Assessment and Plan / ED Course  I have reviewed the triage vital signs and the nursing notes.  Pertinent labs & imaging results that were available during my care of the patient were reviewed by me and considered in my medical decision making (see chart for details).        8:45 PM Patient in similar condition, requiring supplemental oxygen, 2 L via nasal cannula to maintain appropriate saturation. Patient's labs notable for Covid positive result, worsening renal function, elevated BUN, concerning for dehydration, complicating his Covid infection. With new oxygen requirement, renal dysfunction, patient will require admission for further monitoring, management.  [name] was evaluated in Emergency Department on 10/25/2019 for the symptoms described in the history of present illness. He was evaluated in the context of the global COVID-19 pandemic, which necessitated consideration that the patient might be at risk for infection with the SARS-CoV-2 virus that causes COVID-19. Institutional protocols and algorithms that pertain to the evaluation of patients at risk for COVID-19 are in a state of rapid  change based on information released by regulatory bodies including the CDC and federal and state organizations. These policies and algorithms were followed during  the patient's care in the ED.  Final Clinical Impressions(s) / ED Diagnoses   Final diagnoses:  COVID-19 virus infection     Carmin Muskrat, MD 10/25/19 2047

## 2019-10-25 NOTE — ED Triage Notes (Signed)
Staff at the [facility] (?group home?) observed pt. To "become shaky". He arrives here in no distress. HIs initial O2 sat. Was found to be ~89% and he arrived on O2 at 3 l.p.m. He is able to move himself to the stretcher.

## 2019-10-25 NOTE — H&P (Signed)
History and Physical  [name] NHA:[id number] DOB: [birth year] DOA: 10/25/2019  Referring physician: ER provider PCP: [name], [place]  Outpatient Specialists: I guess patient must have a psychiatrist (patient is a very poor historian) Patient coming from: Group home  Chief Complaint: Worsening shortness of breath and fever.  HPI:  Patient is a 68 year old male, morbidly obese, with past medical history significant for paranoid schizophrenia, prolonged QT interval, non-Hodgkin's lymphoma, hypertension, hyperlipidemia, COPD, diastolic congestive heart failure and chronic kidney disease stage tolerated.  As documented above, patient is a very poor historian, therefore, could not give any significant history.  Collateral information revealed that patient has developed worsening shortness of breath with O2 sat of 89%, now requiring supplemental oxygen 2 L/min via nasal cannula, associated fever and chills.  On presentation to the hospital, Covid test came back negative.  Worsening renal function is noted, with creatinine of 3.09 (up from 1.67).  Patient's medication reveals meloxicam 15 mg p.o. once daily prior to admission.  Chest x-ray revealed multifocal pneumonia.  ED Course: On presentation to the hospital, temperature of 98.2 was documented, heart rate of 79 to 105 bpm, respiratory rate of 18 to 44/min, blood pressure of 89-119/76 mmHg and O2 sats of 91% on 2 L.  Hospitalist team has been called to admit patient for further assessment and management of pneumonia due to SARS-CoV-2.  Pertinent labs: Covid test is positive.  Chest x-ray reveals multifocal pneumonia.  Sodium is 145, potassium of 5.1, BUN of 46, creatinine of 3.09 (up from 1.27) and blood sugar 128.  CBC revealed WBC of 6.3, hemoglobin of 10.8, platelet count of 126.  Imaging: independently reviewed.   Review of Systems:  Unobtainable.  Past Medical History:  Diagnosis Date  . Cellulitis   . CHF (congestive heart  failure) ([place])   . Chronic kidney disease 09/20/2008  . Chronic lymphocytic leukemia ([place])   . cll dx'd 10/2003   no rx  . CLL (chronic lymphoblastic leukemia) 10/2003  . COPD (chronic obstructive pulmonary disease) ([place])   . GERD (gastroesophageal reflux disease)   . Hiatal hernia   . Hyperlipemia   . Hypertension   . Lower extremity edema   . NHL (non-Hodgkin's lymphoma) ([place]) dx'd 08/2008 rt groin   chemo comp 08/2008  . Non Hodgkin's lymphoma ([place]) 08/2008  . Non Hodgkin's lymphoma ([place])   . Paranoid schizophrenia ([place])   . Prolonged Q-T interval on ECG   . RBBB 09/17/2017  . Schizoaffective disorder ([place])   . Sepsis ([place])   . Severe left ventricular hypertrophy 10/21/2017  . Vitamin D deficiency     Past Surgical History:  Procedure Laterality Date  . LYMPH NODE BIOPSY       reports that he has never smoked. He has never used smokeless tobacco. He reports that he does not drink alcohol or use drugs.  Allergies  Allergen Reactions  . Sulfamethoxazole Rash    Family History  Problem Relation Age of Onset  . Alcohol abuse Father      Prior to Admission medications   Medication Sig Start Date End Date Taking? Authorizing Provider  acetaminophen (TYLENOL) 500 MG tablet Take 1,000 mg by mouth every 6 (six) hours as needed for mild pain.   Yes [provider]  albuterol (PROVENTIL HFA;VENTOLIN HFA) 108 (90 Base) MCG/ACT inhaler Inhale 2 puffs into the lungs every 4 (four) hours as needed for wheezing or shortness of breath.   Yes [provider]  aspirin EC 81  MG tablet Take 81 mg by mouth daily.   Yes [provider]  atorvastatin (LIPITOR) 20 MG tablet Take 1 tablet (20 mg total) by mouth daily. 10/03/18  Yes Skeet Latch, MD  benztropine (COGENTIN) 0.5 MG tablet Take 1 tablet (0.5 mg total) by mouth 2 (two) times daily. 07/20/18  Yes Patrecia Pour, Christean Grief, MD  chlorproMAZINE (THORAZINE) 100 MG tablet Take 100 mg at bedtime by mouth.    Yes  [provider]  chlorproMAZINE (THORAZINE) 50 MG tablet Take 50 mg by mouth 3 (three) times daily. 12/14/18  Yes [provider]  Cholecalciferol (DIALYVITE VITAMIN D 5000) 125 MCG (5000 UT) capsule Take 5,000 Units by mouth daily.   Yes [provider]  cloZAPine (CLOZARIL) 100 MG tablet Take 100 mg by mouth 2 (two) times daily.    Yes [provider]  clozapine (CLOZARIL) 50 MG tablet Take 50 mg by mouth 2 (two) times daily.    Yes [provider]  docusate sodium (COLACE) 100 MG capsule Take 100 mg by mouth daily.   Yes [provider]  escitalopram (LEXAPRO) 10 MG tablet Take 10 mg by mouth daily before breakfast.    Yes [provider]  ketoconazole (NIZORAL) 2 % cream Apply 1 application topically daily. Apply small amount to groin area for 1 week, then repeat as necessary.   Yes [provider]  lamoTRIgine (LAMICTAL) 25 MG tablet Take 50 mg by mouth 3 (three) times daily.    Yes [provider]  levothyroxine (SYNTHROID, LEVOTHROID) 100 MCG tablet Take 100 mcg daily by mouth.    Yes [provider]  meloxicam (MOBIC) 15 MG tablet Take 1 tablet (15 mg total) by mouth daily. Take 1 daily with food. 12/26/18  Yes Harris, Abigail, PA-C  metoprolol tartrate (LOPRESSOR) 25 MG tablet Take 0.5 tablets (12.5 mg total) by mouth 2 (two) times daily. 10/03/18 10/25/19 Yes Skeet Latch, MD  nystatin (MYCOSTATIN/NYSTOP) powder Apply topically 4 (four) times daily. Patient taking differently: Apply 1 Bottle topically daily. Apply to buttocks daily. 12/31/18  Yes Plunkett, Loree Fee, MD  Omega-3 Fatty Acids (FISH OIL) 1000 MG CAPS Take 4,000 mg by mouth daily.   Yes [provider]  polyethylene glycol (MIRALAX / GLYCOLAX) packet Take 17 g daily by mouth. Dissolve 17g in 8oz of water/juice and drink by mouth every day   Yes [provider]  senna (SENOKOT) 8.6 MG TABS tablet Take 1 tablet by mouth  daily.   Yes [provider]  chlorproMAZINE (THORAZINE) 25 MG tablet Take 2 tablets (50 mg total) by mouth 3 (three) times daily. Patient not taking: Reported on 12/26/2018 07/20/18   Patrecia Pour, Christean Grief, MD  methylPREDNISolone (MEDROL DOSEPAK) 4 MG TBPK tablet Use as directed Patient not taking: Reported on 10/25/2019 12/26/18   Margarita Mail, PA-C    Physical Exam: Vitals:   10/25/19 1836  BP: 119/76  Pulse: 97  Resp: (!) 28  Temp: 98.2 F (36.8 C)  TempSrc: Oral  SpO2: 96%    Constitutional:  . Appears calm and comfortable.  Patient is morbidly obese. Eyes:  . No pallor. No jaundice.  ENMT:  . external ears, nose appear normal Neck:  . Neck is supple. No JVD Respiratory:  . Decreased air entry globally. [place] Kitchen Respiratory effort normal. No retractions or accessory muscle use Cardiovascular:  . S1S2 . No LE extremity edema   Abdomen:  . Abdomen is morbidly obese, soft and non tender. Organs are difficult to  assess. Neurologic:  . Awake and alert. . Moves all limbs.  Wt Readings from Last 3 Encounters:  11/06/18 124.7 kg  10/03/18 122.7 kg  09/22/18 124.7 kg    I have personally reviewed following labs and imaging studies  Labs on Admission:  CBC: Recent Labs  Lab 10/25/19 1853  WBC 6.3  NEUTROABS 4.7  HGB 10.8*  HCT 34.3*  MCV 99.7  PLT 557*   Basic Metabolic Panel: Recent Labs  Lab 10/25/19 1853  NA 145  K 5.1  CL 110  CO2 25  GLUCOSE 128*  BUN 46*  CREATININE 3.09*  CALCIUM 8.6*   Liver Function Tests: Recent Labs  Lab 10/25/19 1853  AST 24  ALT 13  ALKPHOS 52  BILITOT 0.5  PROT 5.9*  ALBUMIN 3.2*   Recent Labs  Lab 10/25/19 1853  LIPASE 29   No results for input(s): AMMONIA in the last 168 hours. Coagulation Profile: No results for input(s): INR, PROTIME in the last 168 hours. Cardiac Enzymes: No results for input(s): CKTOTAL, CKMB, CKMBINDEX, TROPONINI in the last 168 hours. BNP (last 3 results) No results for  input(s): PROBNP in the last 8760 hours. HbA1C: No results for input(s): HGBA1C in the last 72 hours. CBG: No results for input(s): GLUCAP in the last 168 hours. Lipid Profile: No results for input(s): CHOL, HDL, LDLCALC, TRIG, CHOLHDL, LDLDIRECT in the last 72 hours. Thyroid Function Tests: No results for input(s): TSH, T4TOTAL, FREET4, T3FREE, THYROIDAB in the last 72 hours. Anemia Panel: No results for input(s): VITAMINB12, FOLATE, FERRITIN, TIBC, IRON, RETICCTPCT in the last 72 hours. Urine analysis:    Component Value Date/Time   COLORURINE YELLOW 12/31/2018 1404   APPEARANCEUR CLEAR 12/31/2018 1404   LABSPEC 1.013 12/31/2018 1404   PHURINE 5.0 12/31/2018 1404   GLUCOSEU NEGATIVE 12/31/2018 1404   HGBUR NEGATIVE 12/31/2018 1404   BILIRUBINUR NEGATIVE 12/31/2018 1404   KETONESUR NEGATIVE 12/31/2018 1404   PROTEINUR NEGATIVE 12/31/2018 1404   UROBILINOGEN 0.2 05/09/2015 1740   NITRITE NEGATIVE 12/31/2018 1404   LEUKOCYTESUR NEGATIVE 12/31/2018 1404   Sepsis Labs: @LABRCNTIP (procalcitonin:4,lacticidven:4) )No results found for this or any previous visit (from the past 240 hour(s)).    Radiological Exams on Admission: Dg Chest Port 1 View  Result Date: 10/25/2019 CLINICAL DATA:  Altered level of consciousness. EXAM: PORTABLE CHEST 1 VIEW COMPARISON:  12/26/2018 FINDINGS: Normal heart size. No pleural effusion or edema identified. Bilateral airspace opacities are identified involving both mid and lower lung zones. IMPRESSION: 1. Bilateral airspace opacities are identified concerning for multifocal infection. Electronically Signed   By: Kerby Moors M.D.   On: 10/25/2019 19:25    Active Problems:   Pneumonia due to severe acute respiratory syndrome coronavirus 2 (SARS-CoV-2)   Assessment/Plan Pneumonia due to SARS-CoV-2: -Admit patient for further assessment and management -Start patient on IV steroids -Consider remdesivir if renal function permits -Supportive care  -Inflammatory markers daily -Antibiotics -Further management depend on hospital course.  Prolonged QT interval: -Monitor closely -EKG -Patient is on several psychiatric medications that can prolong QT interval. -Have a low threshold to consult psychiatric team  Morbid obesity: -Further management on outpatient basis -Psychiatric medications may be contributing to the morbid obesity as well.  Diastolic congestive heart failure, chronic: -Stable. -Continue to monitor closely.  COPD: -Continue treatment with inhalers -IV steroids -Monitor closely for possible exacerbation  Acute kidney injury on chronic kidney disease stage IIIb: -Patient was on meloxicam 15 mg once daily -This could be secondary to NSAIDs  use -Repeat renal panel, and if indicated, pursue further work-up -We will be careful with hydration in the setting of COVID-19 infection  Further management depend on hospital course  DVT prophylaxis: Subcutaneous Lovenox as per COVID-19 protocol Code Status: Full code Family Communication:  Disposition Plan: Likely back to the group home Consults called: None.  Have low threshold to consult psychiatry and/or nephrology Admission status: Inpatient  Time spent: 65 minutes  Dana Allan, MD  [facility] Pager #: [phone] 7PM-7AM contact night coverage as above  10/25/2019, 9:29 PM

## 2019-10-26 ENCOUNTER — Inpatient Hospital Stay (HOSPITAL_COMMUNITY): Payer: Medicare Other

## 2019-10-26 ENCOUNTER — Other Ambulatory Visit: Payer: Self-pay

## 2019-10-26 ENCOUNTER — Encounter (HOSPITAL_COMMUNITY): Payer: Self-pay | Admitting: Internal Medicine

## 2019-10-26 DIAGNOSIS — I5032 Chronic diastolic (congestive) heart failure: Secondary | ICD-10-CM

## 2019-10-26 DIAGNOSIS — N179 Acute kidney failure, unspecified: Secondary | ICD-10-CM

## 2019-10-26 DIAGNOSIS — N184 Chronic kidney disease, stage 4 (severe): Secondary | ICD-10-CM

## 2019-10-26 LAB — COMPREHENSIVE METABOLIC PANEL
ALT: 16 U/L (ref 0–44)
AST: 24 U/L (ref 15–41)
Albumin: 2.9 g/dL — ABNORMAL LOW (ref 3.5–5.0)
Alkaline Phosphatase: 50 U/L (ref 38–126)
Anion gap: 9 (ref 5–15)
BUN: 49 mg/dL — ABNORMAL HIGH (ref 8–23)
CO2: 26 mmol/L (ref 22–32)
Calcium: 8.7 mg/dL — ABNORMAL LOW (ref 8.9–10.3)
Chloride: 109 mmol/L (ref 98–111)
Creatinine, Ser: 2.63 mg/dL — ABNORMAL HIGH (ref 0.61–1.24)
GFR calc Af Amer: 28 mL/min — ABNORMAL LOW (ref >60)
GFR calc non Af Amer: 24 mL/min — ABNORMAL LOW (ref >60)
Glucose, Bld: 138 mg/dL — ABNORMAL HIGH (ref 70–99)
Potassium: 5.1 mmol/L (ref 3.5–5.1)
Sodium: 144 mmol/L (ref 135–145)
Total Bilirubin: 0.7 mg/dL (ref 0.3–1.2)
Total Protein: 5.9 g/dL — ABNORMAL LOW (ref 6.5–8.1)

## 2019-10-26 LAB — C-REACTIVE PROTEIN
CRP: 8.3 mg/dL — ABNORMAL HIGH (ref <1.0)
CRP: 9.9 mg/dL — ABNORMAL HIGH (ref <1.0)

## 2019-10-26 LAB — URINALYSIS, ROUTINE W REFLEX MICROSCOPIC
Bilirubin Urine: NEGATIVE
Glucose, UA: NEGATIVE mg/dL
Hgb urine dipstick: NEGATIVE
Ketones, ur: NEGATIVE mg/dL
Leukocytes,Ua: NEGATIVE
Nitrite: NEGATIVE
Protein, ur: NEGATIVE mg/dL
Specific Gravity, Urine: 1.019 (ref 1.005–1.030)
pH: 5 (ref 5.0–8.0)

## 2019-10-26 LAB — FERRITIN
Ferritin: 304 ng/mL (ref 24–336)
Ferritin: 268 ng/mL (ref 24–336)

## 2019-10-26 LAB — TROPONIN I (HIGH SENSITIVITY): Troponin I (High Sensitivity): 11 ng/L (ref <18)

## 2019-10-26 LAB — ABO/RH: ABO/RH(D): B POS

## 2019-10-26 LAB — D-DIMER, QUANTITATIVE: D-Dimer, Quant: 1.17 ug/mL-FEU — ABNORMAL HIGH (ref 0.00–0.50)

## 2019-10-26 LAB — PHOSPHORUS: Phosphorus: 3 mg/dL (ref 2.5–4.6)

## 2019-10-26 LAB — HIV ANTIBODY (ROUTINE TESTING W REFLEX): HIV Screen 4th Generation wRfx: NONREACTIVE

## 2019-10-26 LAB — LACTATE DEHYDROGENASE: LDH: 228 U/L — ABNORMAL HIGH (ref 98–192)

## 2019-10-26 LAB — PROCALCITONIN: Procalcitonin: <0.1 ng/mL

## 2019-10-26 LAB — MAGNESIUM: Magnesium: 2.5 mg/dL — ABNORMAL HIGH (ref 1.7–2.4)

## 2019-10-26 MED ORDER — DEXAMETHASONE SODIUM PHOSPHATE 10 MG/ML IJ SOLN
6.0000 mg | INTRAMUSCULAR | Status: AC
  Administered 2019-10-26 – 2019-11-03 (×9): 6 mg via INTRAVENOUS
  Filled 2019-10-26 (×9): qty 1

## 2019-10-26 NOTE — ED Notes (Signed)
[name] [address]

## 2019-10-26 NOTE — ED Notes (Signed)
Matt RN attempting [place] IV at this time.

## 2019-10-26 NOTE — ED Notes (Signed)
Unsuccessful [place] IV attempt x1. IV team consult remains in place.

## 2019-10-26 NOTE — ED Notes (Signed)
[name] [phone]) is pts legal guardian, please call for updates regarding pt. Called at this time with not response.

## 2019-10-26 NOTE — ED Notes (Signed)
Sitter at bedside.

## 2019-10-26 NOTE — ED Notes (Signed)
Meal tray given. Patient eating with assistance at this time.

## 2019-10-26 NOTE — ED Notes (Signed)
Pharmacy contacted about messing meds.

## 2019-10-26 NOTE — ED Notes (Signed)
Unsuccessful IV attempt x1. IV consult placed. 

## 2019-10-26 NOTE — ED Notes (Signed)
[name] for the patient said she would like the doctor or the nurse to call her and give her the diagnose of the patient and an update, [phone]

## 2019-10-26 NOTE — Progress Notes (Signed)
PROGRESS NOTE  [name] YQM:[id number] DOB: 1951-07-17 DOA: 10/25/2019 PCP: [name], FNP  HPI/Recap of past 24 hours: HPI from Dr Marthenia Rolling Patient is a 68 year old male, obese, with past medical history significant for paranoid schizophrenia, prolonged QT interval, non-Hodgkin's lymphoma, hypertension, hyperlipidemia, COPD, diastolic congestive heart failure and chronic kidney disease. As documented above, patient is a very poor historian, therefore, could not give any significant history.  Collateral information revealed that patient has developed worsening shortness of breath with O2 sat of 89%, now requiring supplemental oxygen 2 L/min via nasal cannula, associated fever and chills.  On presentation to the hospital, Covid test came back positive.  Worsening renal function is noted, with creatinine of 3.09 (up from 1.67).  Patient's medication reveals meloxicam 15 mg p.o. once daily prior to admission.  Chest x-ray revealed multifocal pneumonia. In the ED, respiratory rate of 18 to 44/min, and O2 sats of 91% on 2 L.  Hospitalist team has been called to admit patient for further assessment and management of pneumonia due to SARS-CoV-2   Today, saw patient at bedside.  Looked stable.  Unable to perform ROS.  Unable to have meaningful conversation  Assessment/Plan: Active Problems:   Pneumonia due to severe acute respiratory syndrome coronavirus 2 (SARS-CoV-2)  Pneumonia due to COVID-19 virus Currently afebrile, with no leukocytosis On 2 L of oxygen Inflammatory markers elevated, will trend Procalcitonin negative Chest x-ray revealed multifocal pneumonia Continue Decadron, remdesivir Continue ceftriaxone, doxycycline Supplemental oxygen, inhalers, cough suppressant, vitamins  AKI on CKD stage IV Baseline creatinine 1.7-2, on admission 3.09 On daily meloxicam at home Repeat BMP pending Renal ultrasound pending We will hold off IV hydration due to Covid status for  now Daily BMP  Anemia of CKD Hemoglobin slightly down from baseline Daily CBC  Chronic diastolic HF Stable Echo done in 09/2018 showed EF of 65 to 88%, with LV diastolic dysfunction Monitor closely  Hyperlipidemia Continue Lipitor  Hypothyroidism Continue Synthroid  Chronic thrombocytopenia Daily CBC  Schizoaffective disorder Continue clozapine, benztropine, Thorazine, Lamictal, Lexapro          Malnutrition Type:      Malnutrition Characteristics:      Nutrition Interventions:       Estimated body mass index is 37.3 kg/m as calculated from the following:   Height as of 11/06/18: 6' (1.829 m).   Weight as of 11/06/18: 124.7 kg.     Code Status: Full  Family Communication: None at bedside  Disposition Plan: To be determined   Consultants:  None  Procedures:  None  Antimicrobials:  Ceftriaxone  Doxycycline  DVT prophylaxis: Lovenox   Objective: Vitals:   10/26/19 0948 10/26/19 1100 10/26/19 1130 10/26/19 1200  BP: 115/70 106/73 106/74 101/82  Pulse: 83 79 84 84  Resp: 20 (!) 27 (!) 25   Temp:      TempSrc:      SpO2: 98% 98% 97% 98%    Intake/Output Summary (Last 24 hours) at 10/26/2019 1333 Last data filed at 10/26/2019 0256 Gross per 24 hour  Intake 390 ml  Output --  Net 390 ml   There were no vitals filed for this visit.  Exam:  General: NAD, obese, awake/alert, unable to have meaningful conversation  Cardiovascular: S1, S2 present  Respiratory: Diminished air entry bilaterally  Abdomen: Soft, nontender, nondistended, bowel sounds present  Musculoskeletal: No bilateral pedal edema noted  Skin: Normal  Psychiatry:  Unable to assess   Data Reviewed: CBC: Recent Labs  Lab 10/25/19 1853  WBC 6.3  NEUTROABS 4.7  HGB 10.8*  HCT 34.3*  MCV 99.7  PLT 606*   Basic Metabolic Panel: Recent Labs  Lab 10/25/19 1853  NA 145  K 5.1  CL 110  CO2 25  GLUCOSE 128*  BUN 46*  CREATININE 3.09*   CALCIUM 8.6*   GFR: CrCl cannot be calculated (Unknown ideal weight.). Liver Function Tests: Recent Labs  Lab 10/25/19 1853  AST 24  ALT 13  ALKPHOS 52  BILITOT 0.5  PROT 5.9*  ALBUMIN 3.2*   Recent Labs  Lab 10/25/19 1853  LIPASE 29   No results for input(s): AMMONIA in the last 168 hours. Coagulation Profile: No results for input(s): INR, PROTIME in the last 168 hours. Cardiac Enzymes: No results for input(s): CKTOTAL, CKMB, CKMBINDEX, TROPONINI in the last 168 hours. BNP (last 3 results) No results for input(s): PROBNP in the last 8760 hours. HbA1C: No results for input(s): HGBA1C in the last 72 hours. CBG: No results for input(s): GLUCAP in the last 168 hours. Lipid Profile: No results for input(s): CHOL, HDL, LDLCALC, TRIG, CHOLHDL, LDLDIRECT in the last 72 hours. Thyroid Function Tests: No results for input(s): TSH, T4TOTAL, FREET4, T3FREE, THYROIDAB in the last 72 hours. Anemia Panel: Recent Labs    10/26/19 0001  FERRITIN 304   Urine analysis:    Component Value Date/Time   COLORURINE YELLOW 10/25/2019 2357   APPEARANCEUR CLEAR 10/25/2019 2357   LABSPEC 1.019 10/25/2019 2357   PHURINE 5.0 10/25/2019 2357   GLUCOSEU NEGATIVE 10/25/2019 2357   HGBUR NEGATIVE 10/25/2019 2357   [place] NEGATIVE 10/25/2019 2357   KETONESUR NEGATIVE 10/25/2019 2357   PROTEINUR NEGATIVE 10/25/2019 2357   UROBILINOGEN 0.2 05/09/2015 1740   NITRITE NEGATIVE 10/25/2019 2357   LEUKOCYTESUR NEGATIVE 10/25/2019 2357   Sepsis Labs: @LABRCNTIP (procalcitonin:4,lacticidven:4)  ) Recent Results (from the past 240 hour(s))  Culture, blood (routine x 2)     Status: None (Preliminary result)   Collection Time: 10/25/19 11:58 PM   Specimen: BLOOD RIGHT HAND  Result Value Ref Range Status   Specimen Description   Final    BLOOD RIGHT HAND Performed at [facility], [place] [address] [place], [place] [address]    Special Requests   Final    BOTTLES DRAWN AEROBIC AND  ANAEROBIC Blood Culture results may not be optimal due to an inadequate volume of blood received in culture bottles Performed at [facility] [address], [place], [place] [address]    Culture   Final    NO GROWTH < 12 HOURS Performed at [facility] [address], [place], [place] [address]    Report Status PENDING  Incomplete  Culture, blood (routine x 2)     Status: None (Preliminary result)   Collection Time: 10/25/19 11:58 PM   Specimen: BLOOD  Result Value Ref Range Status   Specimen Description   Final    BLOOD BLOOD LEFT HAND Performed at [facility] [address], [place], [place] [address]    Special Requests   Final    BOTTLES DRAWN AEROBIC AND ANAEROBIC Blood Culture results may not be optimal due to an excessive volume of blood received in culture bottles Performed at [facility] [address] [place], [place] [address]    Culture   Final    NO GROWTH < 12 HOURS Performed at [facility] [address], [place], [place] [address]    Report Status  PENDING  Incomplete      Studies: DG Chest Port 1 View  Result Date: 10/25/2019 CLINICAL DATA:  Altered level of consciousness. EXAM: PORTABLE CHEST 1 VIEW COMPARISON:  12/26/2018 FINDINGS: Normal heart size. No pleural effusion or edema identified. Bilateral airspace opacities are identified involving both mid and lower lung zones. IMPRESSION: 1. Bilateral airspace opacities are identified concerning for multifocal infection. Electronically Signed   By: Kerby Moors M.D.   On: 10/25/2019 19:25    Scheduled Meds: . aspirin EC  81 mg Oral Daily  . atorvastatin  20 mg Oral q1800  . benztropine  0.5 mg Oral BID  . chlorproMAZINE  100 mg Oral QHS  . chlorproMAZINE  50 mg Oral TID  . cholecalciferol  5,000 Units Oral Daily  . cloZAPine  100 mg Oral BID  . clozapine  50 mg Oral BID  . dexamethasone (DECADRON) injection  6 mg Intravenous Q24H   . docusate sodium  100 mg Oral Daily  . doxycycline  100 mg Oral Q12H  . enoxaparin (LOVENOX) injection  30 mg Subcutaneous QHS  . escitalopram  10 mg Oral QAC breakfast  . fluticasone  1 puff Inhalation BID  . folic acid  1 mg Oral Daily  . Ipratropium-Albuterol  1 puff Inhalation Q6H  . lamoTRIgine  50 mg Oral TID  . levothyroxine  100 mcg Oral Daily  . multivitamin with minerals  1 tablet Oral Daily  . nystatin  1 Bottle Topical Daily  . polyethylene glycol  17 g Oral Daily  . senna  1 tablet Oral Daily  . thiamine  100 mg Oral Daily  . vitamin C  500 mg Oral Daily  . zinc sulfate  220 mg Oral Daily    Continuous Infusions: . cefTRIAXone (ROCEPHIN)  IV Stopped (10/26/19 0255)  . remdesivir 100 mg in NS 100 mL       LOS: 1 day     Alma Friendly, MD [facility]  If 7PM-7AM, please contact night-coverage [email or web address] 10/26/2019, 1:33 PM

## 2019-10-27 ENCOUNTER — Other Ambulatory Visit: Payer: Self-pay

## 2019-10-27 LAB — COMPREHENSIVE METABOLIC PANEL
ALT: 17 U/L (ref 0–44)
AST: 31 U/L (ref 15–41)
Albumin: 3.5 g/dL (ref 3.5–5.0)
Alkaline Phosphatase: 56 U/L (ref 38–126)
Anion gap: 14 (ref 5–15)
BUN: 51 mg/dL — ABNORMAL HIGH (ref 8–23)
CO2: 23 mmol/L (ref 22–32)
Calcium: 9.5 mg/dL (ref 8.9–10.3)
Chloride: 108 mmol/L (ref 98–111)
Creatinine, Ser: 2.44 mg/dL — ABNORMAL HIGH (ref 0.61–1.24)
GFR calc Af Amer: 31 mL/min — ABNORMAL LOW (ref >60)
GFR calc non Af Amer: 27 mL/min — ABNORMAL LOW (ref >60)
Glucose, Bld: 146 mg/dL — ABNORMAL HIGH (ref 70–99)
Potassium: 5 mmol/L (ref 3.5–5.1)
Sodium: 145 mmol/L (ref 135–145)
Total Bilirubin: 0.4 mg/dL (ref 0.3–1.2)
Total Protein: 6.8 g/dL (ref 6.5–8.1)

## 2019-10-27 LAB — MAGNESIUM: Magnesium: 2.5 mg/dL — ABNORMAL HIGH (ref 1.7–2.4)

## 2019-10-27 LAB — FERRITIN: Ferritin: 303 ng/mL (ref 24–336)

## 2019-10-27 LAB — C-REACTIVE PROTEIN: CRP: 9.2 mg/dL — ABNORMAL HIGH (ref <1.0)

## 2019-10-27 LAB — PHOSPHORUS: Phosphorus: 4.1 mg/dL (ref 2.5–4.6)

## 2019-10-27 LAB — D-DIMER, QUANTITATIVE: D-Dimer, Quant: 1.05 ug/mL-FEU — ABNORMAL HIGH (ref 0.00–0.50)

## 2019-10-27 MED ORDER — ENOXAPARIN SODIUM 40 MG/0.4ML ~~LOC~~ SOLN
40.0000 mg | Freq: Every day | SUBCUTANEOUS | Status: DC
  Administered 2019-10-27: 40 mg via SUBCUTANEOUS
  Filled 2019-10-27: qty 0.4

## 2019-10-27 MED ORDER — CHLORPROMAZINE HCL 25 MG PO TABS
50.0000 mg | ORAL_TABLET | Freq: Three times a day (TID) | ORAL | Status: DC
  Administered 2019-10-27 – 2019-10-28 (×3): 50 mg via ORAL
  Filled 2019-10-27 (×2): qty 2
  Filled 2019-10-27: qty 1
  Filled 2019-10-27 (×2): qty 2

## 2019-10-27 MED ORDER — CHLORPROMAZINE HCL 25 MG PO TABS
100.0000 mg | ORAL_TABLET | Freq: Every day | ORAL | Status: DC
  Administered 2019-10-27: 100 mg via ORAL
  Filled 2019-10-27: qty 2

## 2019-10-27 MED ORDER — CLOZAPINE 25 MG PO TABS
50.0000 mg | ORAL_TABLET | Freq: Two times a day (BID) | ORAL | Status: DC
  Administered 2019-10-27 (×2): 50 mg via ORAL
  Filled 2019-10-27 (×3): qty 2

## 2019-10-27 MED ORDER — CLOZAPINE 100 MG PO TABS
100.0000 mg | ORAL_TABLET | Freq: Two times a day (BID) | ORAL | Status: DC
  Administered 2019-10-27 (×2): 100 mg via ORAL
  Filled 2019-10-27 (×3): qty 1

## 2019-10-27 NOTE — Progress Notes (Signed)
PROGRESS NOTE  [name]:[id number] DOB: 04-19-1951 DOA: 10/25/2019 PCP: [name], FNP  HPI/Recap of past 24 hours: HPI from Dr Marthenia Rolling Patient is a 68 year old male, obese, with past medical history significant for paranoid schizophrenia, prolonged QT interval, non-Hodgkin's lymphoma, hypertension, hyperlipidemia, COPD, diastolic congestive heart failure and chronic kidney disease. As documented above, patient is a very poor historian, therefore, could not give any significant history.  Collateral information revealed that patient has developed worsening shortness of breath with O2 sat of 89%, now requiring supplemental oxygen 2 L/min via nasal cannula, associated fever and chills.  On presentation to the hospital, Covid test came back positive.  Worsening renal function is noted, with creatinine of 3.09 (up from 1.67).  Patient's medication reveals meloxicam 15 mg p.o. once daily prior to admission.  Chest x-ray revealed multifocal pneumonia. In the ED, respiratory rate of 18 to 44/min, and O2 sats of 91% on 2 L.  Hospitalist team has been called to admit patient for further assessment and management of pneumonia due to SARS-CoV-2     Today, patient looks comfortable, denies any new complaints.  Assessment/Plan: Active Problems:   Pneumonia due to severe acute respiratory syndrome coronavirus 2 (SARS-CoV-2)  Pneumonia due to COVID-19 virus Currently afebrile, with no leukocytosis On 2 L of oxygen, saturating well above 90% Inflammatory markers elevated, will trend Procalcitonin negative Chest x-ray revealed multifocal pneumonia Continue Decadron, remdesivir Continue ceftriaxone, doxycycline Supplemental oxygen, inhalers, cough suppressant, vitamins  AKI on CKD stage IV Improving Baseline creatinine 1.7-2, on admission 3.09 On daily meloxicam at home, hold Renal ultrasound unremarkable Continue to hold off IV hydration due to Covid status for now Daily BMP  QTC  prolongation EKG with prolonged QTC on admission, now improved Likely due to psych meds Daily EKG Monitor electrolytes closely  Anemia of CKD Hemoglobin slightly down from baseline Daily CBC  Chronic diastolic HF Stable Echo done in 09/2018 showed EF of 65 to 44%, with LV diastolic dysfunction Monitor closely  Hyperlipidemia Continue Lipitor  Hypothyroidism Continue Synthroid  Chronic thrombocytopenia Daily CBC  Schizoaffective disorder Continue clozapine, benztropine, Thorazine, Lamictal, Lexapro  Morbidly obese Lifestyle modification advised          Malnutrition Type:      Malnutrition Characteristics:      Nutrition Interventions:       Estimated body mass index is 54.99 kg/m as calculated from the following:   Height as of this encounter: 6' (1.829 m).   Weight as of this encounter: 183.9 kg.     Code Status: Full  Family Communication: None at bedside  Disposition Plan: To be determined   Consultants:  None  Procedures:  None  Antimicrobials:  Ceftriaxone  Doxycycline  DVT prophylaxis: Lovenox   Objective: Vitals:   10/26/19 1809 10/26/19 1812 10/26/19 2025 10/27/19 1315  BP: (!) 116/55  (!) 109/58 130/73  Pulse: 80  76 98  Resp: 18  20 20   Temp: 97.6 F (36.4 C)  (!) 97.5 F (36.4 C) 97.9 F (36.6 C)  TempSrc: Oral  Oral Oral  SpO2:   99% 98%  Weight:  (!) 183.9 kg    Height: 6' (1.829 m)       Intake/Output Summary (Last 24 hours) at 10/27/2019 1617 Last data filed at 10/27/2019 1550 Gross per 24 hour  Intake 1050 ml  Output 2099 ml  Net -1049 ml   Filed Weights   10/26/19 1812  Weight: (!) 183.9 kg    Exam:  General: NAD, obese, awake/alert  Cardiovascular: S1, S2 present  Respiratory:  Diminished air entry bilaterally  Abdomen: Soft, nontender, nondistended, bowel sounds present  Musculoskeletal: No bilateral pedal edema noted  Skin: Normal  Psychiatry: Normal mood   Data  Reviewed: CBC: Recent Labs  Lab 10/25/19 1853  WBC 6.3  NEUTROABS 4.7  HGB 10.8*  HCT 34.3*  MCV 99.7  PLT 628*   Basic Metabolic Panel: Recent Labs  Lab 10/25/19 1853 10/26/19 1707 10/27/19 0319  NA 145 144 145  K 5.1 5.1 5.0  CL 110 109 108  CO2 25 26 23   GLUCOSE 128* 138* 146*  BUN 46* 49* 51*  CREATININE 3.09* 2.63* 2.44*  CALCIUM 8.6* 8.7* 9.5  MG  --  2.5* 2.5*  PHOS  --  3.0 4.1   GFR: Estimated Creatinine Clearance: 50.6 mL/min (A) (by C-G formula based on SCr of 2.44 mg/dL (H)). Liver Function Tests: Recent Labs  Lab 10/25/19 1853 10/26/19 1707 10/27/19 0319  AST 24 24 31   ALT 13 16 17   ALKPHOS 52 50 56  BILITOT 0.5 0.7 0.4  PROT 5.9* 5.9* 6.8  ALBUMIN 3.2* 2.9* 3.5   Recent Labs  Lab 10/25/19 1853  LIPASE 29   No results for input(s): AMMONIA in the last 168 hours. Coagulation Profile: No results for input(s): INR, PROTIME in the last 168 hours. Cardiac Enzymes: No results for input(s): CKTOTAL, CKMB, CKMBINDEX, TROPONINI in the last 168 hours. BNP (last 3 results) No results for input(s): PROBNP in the last 8760 hours. HbA1C: No results for input(s): HGBA1C in the last 72 hours. CBG: No results for input(s): GLUCAP in the last 168 hours. Lipid Profile: No results for input(s): CHOL, HDL, LDLCALC, TRIG, CHOLHDL, LDLDIRECT in the last 72 hours. Thyroid Function Tests: No results for input(s): TSH, T4TOTAL, FREET4, T3FREE, THYROIDAB in the last 72 hours. Anemia Panel: Recent Labs    10/26/19 1707 10/27/19 0319  FERRITIN 268 303   Urine analysis:    Component Value Date/Time   COLORURINE YELLOW 10/25/2019 [place] 10/25/2019 2357   LABSPEC 1.019 10/25/2019 2357   PHURINE 5.0 10/25/2019 2357   GLUCOSEU NEGATIVE 10/25/2019 2357   HGBUR NEGATIVE 10/25/2019 2357   [place] NEGATIVE 10/25/2019 2357   KETONESUR NEGATIVE 10/25/2019 2357   PROTEINUR NEGATIVE 10/25/2019 2357   UROBILINOGEN 0.2 05/09/2015 1740    NITRITE NEGATIVE 10/25/2019 2357   LEUKOCYTESUR NEGATIVE 10/25/2019 2357   Sepsis Labs: @LABRCNTIP (procalcitonin:4,lacticidven:4)  ) Recent Results (from the past 240 hour(s))  Culture, blood (routine x 2)     Status: None (Preliminary result)   Collection Time: 10/25/19 11:58 PM   Specimen: BLOOD RIGHT HAND  Result Value Ref Range Status   Specimen Description   Final    BLOOD RIGHT HAND Performed at [facility], [place] [address], [place], [place] [address]    Special Requests   Final    BOTTLES DRAWN AEROBIC AND ANAEROBIC Blood Culture results may not be optimal due to an inadequate volume of blood received in culture bottles Performed at [facility] [address], [place], [place] [address]    Culture   Final    NO GROWTH 1 DAY Performed at [facility], [place] [address] [place], [place] [address]    Report Status PENDING  Incomplete  Culture, blood (routine x 2)     Status: None (Preliminary result)   Collection Time: 10/25/19 11:58 PM   Specimen: BLOOD  Result  Value Ref Range Status   Specimen Description   Final    BLOOD BLOOD LEFT HAND Performed at [facility] [address] [place], [place] [address]    Special Requests   Final    BOTTLES DRAWN AEROBIC AND ANAEROBIC Blood Culture results may not be optimal due to an excessive volume of blood received in culture bottles Performed at [facility] [address], [place], [place] [address]    Culture   Final    NO GROWTH 1 DAY Performed at [facility], [place] [address], [place],  [address]    Report Status PENDING  Incomplete      Studies: US RENAL  Result Date: 10/26/2019 CLINICAL DATA:  Acute kidney injury, history CHF, CLL, hypertension, COPD, non-Hodgkin's lymphoma EXAM: RENAL / URINARY TRACT ULTRASOUND COMPLETE COMPARISON:  CT abdomen and pelvis 07/17/2018 FINDINGS: Right Kidney: Renal measurements: 10.2 x 4.6 x 4.9 cm =  volume: 120 mL. Cortical thinning. Upper normal cortical echogenicity. No mass or hydronephrosis. Suspected 5 mm nonobstructing calculus; no RIGHT renal calculi were seen on the prior CT exam from 2019. Left Kidney: Renal measurements: 12.3 x 5.2 x 4.5 cm = volume: 150 mL. Cortical thinning. Upper normal cortical echogenicity. No mass, hydronephrosis or shadowing calculi. Bladder: Question mildly trabeculated bladder wall.  No focal mass. Other: None. IMPRESSION: BILATERAL renal cortical atrophy. No evidence of renal mass or hydronephrosis. Question 5 mm nonobstructing central RIGHT renal calculus. Electronically Signed   By: Lavonia Dana M.D.   On: 10/26/2019 16:45    Scheduled Meds: . aspirin EC  81 mg Oral Daily  . atorvastatin  20 mg Oral q1800  . benztropine  0.5 mg Oral BID  . chlorproMAZINE  100 mg Oral QHS  . chlorproMAZINE  50 mg Oral TID  . cholecalciferol  5,000 Units Oral Daily  . cloZAPine  100 mg Oral BID  . clozapine  50 mg Oral BID  . dexamethasone (DECADRON) injection  6 mg Intravenous Q24H  . docusate sodium  100 mg Oral Daily  . doxycycline  100 mg Oral Q12H  . enoxaparin (LOVENOX) injection  40 mg Subcutaneous QHS  . escitalopram  10 mg Oral QAC breakfast  . fluticasone  1 puff Inhalation BID  . folic acid  1 mg Oral Daily  . Ipratropium-Albuterol  1 puff Inhalation Q6H  . lamoTRIgine  50 mg Oral TID  . levothyroxine  100 mcg Oral Daily  . multivitamin with minerals  1 tablet Oral Daily  . nystatin  1 Bottle Topical Daily  . polyethylene glycol  17 g Oral Daily  . senna  1 tablet Oral Daily  . thiamine  100 mg Oral Daily  . vitamin C  500 mg Oral Daily  . zinc sulfate  220 mg Oral Daily    Continuous Infusions: . cefTRIAXone (ROCEPHIN)  IV 1 g (10/26/19 2353)  . remdesivir 100 mg in NS 100 mL 100 mg (10/27/19 1200)     LOS: 2 days     Alma Friendly, MD [facility]  If 7PM-7AM, please contact night-coverage [email or web address] 10/27/2019, 4:17 PM

## 2019-10-27 NOTE — TOC Progression Note (Signed)
Transition of Care [facility]) - Progression Note    Patient Details  Name: [name] MRN: [id number] Date of Birth: 21-Aug-1951  Transition of Care [facility]) CM/SW Contact  Purcell Mouton, RN Phone Number: 10/27/2019, 4:37 PM  Clinical Narrative:    Pt is COVID positive from [place], Legal guardian.          Expected Discharge Plan and Services                                                 Social Determinants of Health (SDOH) Interventions    Readmission Risk Interventions Readmission Risk Prevention Plan 07/20/2018  Transportation Screening Complete  PCP or Specialist Appt within 3-5 Days Complete  Home Care Screening Complete  HRI or Home Care Consult Complete  Social Work Consult for [place] Planning/Counseling Complete  Palliative Care Screening Complete  Medication Review [employer]) Complete  Some recent data might be hidden

## 2019-10-27 NOTE — Progress Notes (Addendum)
Patient has had 68mL output. Bladder scan shows 974mL. RN notified on call physician.   0144 - Patient voided spontaneously, before in & out catheterization could occur. Post void residual is 374 mL. Patient resting peacefully now.   Orders Received: Repeat bladder scan in 4-6 hours, in and out cath q6h, Place foley if cath x3 or residual post cath is >300. Rn to continue to monitor.

## 2019-10-27 NOTE — Plan of Care (Signed)
  Problem: Education: Goal: Knowledge of General Education information will improve Description: Including pain rating scale, medication(s)/side effects and non-pharmacologic comfort measures Outcome: Progressing   Problem: Clinical Measurements: Goal: Respiratory complications will improve Outcome: Progressing Goal: Cardiovascular complication will be avoided Outcome: Progressing   Problem: Activity: Goal: Risk for activity intolerance will decrease Outcome: Progressing   Problem: Elimination: Goal: Will not experience complications related to urinary retention Outcome: Progressing   Problem: Safety: Goal: Ability to remain free from injury will improve Outcome: Progressing

## 2019-10-27 NOTE — Progress Notes (Signed)
   10/26/19 2030  MEWS Score  Level of Consciousness Responds to Pain   RN received in hand off report this is how the patient arrived from the ED. RN will continue to monitor.

## 2019-10-28 DIAGNOSIS — J1281 Pneumonia due to SARS-associated coronavirus: Secondary | ICD-10-CM

## 2019-10-28 DIAGNOSIS — F200 Paranoid schizophrenia: Secondary | ICD-10-CM

## 2019-10-28 DIAGNOSIS — U071 COVID-19: Principal | ICD-10-CM

## 2019-10-28 DIAGNOSIS — F341 Dysthymic disorder: Secondary | ICD-10-CM

## 2019-10-28 LAB — RESPIRATORY PANEL BY PCR

## 2019-10-28 LAB — COMPREHENSIVE METABOLIC PANEL
ALT: 16 U/L (ref 0–44)
AST: 25 U/L (ref 15–41)
Albumin: 2.8 g/dL — ABNORMAL LOW (ref 3.5–5.0)
Alkaline Phosphatase: 52 U/L (ref 38–126)
Anion gap: 9 (ref 5–15)
BUN: 56 mg/dL — ABNORMAL HIGH (ref 8–23)
CO2: 26 mmol/L (ref 22–32)
Calcium: 9.3 mg/dL (ref 8.9–10.3)
Chloride: 109 mmol/L (ref 98–111)
Creatinine, Ser: 2.19 mg/dL — ABNORMAL HIGH (ref 0.61–1.24)
GFR calc Af Amer: 35 mL/min — ABNORMAL LOW (ref >60)
GFR calc non Af Amer: 30 mL/min — ABNORMAL LOW (ref >60)
Glucose, Bld: 151 mg/dL — ABNORMAL HIGH (ref 70–99)
Potassium: 5.3 mmol/L — ABNORMAL HIGH (ref 3.5–5.1)
Sodium: 144 mmol/L (ref 135–145)
Total Bilirubin: 0.5 mg/dL (ref 0.3–1.2)
Total Protein: 5.4 g/dL — ABNORMAL LOW (ref 6.5–8.1)

## 2019-10-28 LAB — CBC WITH DIFFERENTIAL/PLATELET
Abs Immature Granulocytes: 0.18 10*3/uL — ABNORMAL HIGH (ref 0.00–0.07)
Basophils Absolute: 0 10*3/uL (ref 0.0–0.1)
Basophils Relative: 0 %
Eosinophils Absolute: 0 10*3/uL (ref 0.0–0.5)
Eosinophils Relative: 0 %
HCT: 34.1 % — ABNORMAL LOW (ref 39.0–52.0)
Hemoglobin: 10.8 g/dL — ABNORMAL LOW (ref 13.0–17.0)
Immature Granulocytes: 2 %
Lymphocytes Relative: 12 %
Lymphs Abs: 1.3 10*3/uL (ref 0.7–4.0)
MCH: 30.5 pg (ref 26.0–34.0)
MCHC: 31.7 g/dL (ref 30.0–36.0)
MCV: 96.3 fL (ref 80.0–100.0)
Monocytes Absolute: 0.7 10*3/uL (ref 0.1–1.0)
Monocytes Relative: 6 %
Neutro Abs: 8.6 10*3/uL — ABNORMAL HIGH (ref 1.7–7.7)
Neutrophils Relative %: 80 %
Platelets: 206 10*3/uL (ref 150–400)
RBC: 3.54 MIL/uL — ABNORMAL LOW (ref 4.22–5.81)
RDW: 13.3 % (ref 11.5–15.5)
WBC: 10.8 10*3/uL — ABNORMAL HIGH (ref 4.0–10.5)
nRBC: 0 % (ref 0.0–0.2)

## 2019-10-28 LAB — C-REACTIVE PROTEIN: CRP: 2.9 mg/dL — ABNORMAL HIGH (ref <1.0)

## 2019-10-28 LAB — PHOSPHORUS: Phosphorus: 4.1 mg/dL (ref 2.5–4.6)

## 2019-10-28 LAB — FERRITIN: Ferritin: 257 ng/mL (ref 24–336)

## 2019-10-28 LAB — D-DIMER, QUANTITATIVE: D-Dimer, Quant: 0.83 ug/mL-FEU — ABNORMAL HIGH (ref 0.00–0.50)

## 2019-10-28 LAB — INFLUENZA PANEL BY PCR (TYPE A & B)
Influenza A By PCR: NEGATIVE
Influenza B By PCR: NEGATIVE

## 2019-10-28 LAB — MAGNESIUM: Magnesium: 2.3 mg/dL (ref 1.7–2.4)

## 2019-10-28 MED ORDER — CHLORPROMAZINE HCL 25 MG PO TABS
100.0000 mg | ORAL_TABLET | Freq: Every day | ORAL | Status: DC
  Administered 2019-10-28 – 2019-11-09 (×12): 100 mg via ORAL
  Filled 2019-10-28 (×7): qty 4
  Filled 2019-10-28: qty 2
  Filled 2019-10-28 (×7): qty 4

## 2019-10-28 MED ORDER — CLOZAPINE 25 MG PO TABS
150.0000 mg | ORAL_TABLET | Freq: Two times a day (BID) | ORAL | Status: DC
  Administered 2019-10-28 – 2019-11-10 (×25): 150 mg via ORAL
  Filled 2019-10-28 (×27): qty 2

## 2019-10-28 MED ORDER — ENOXAPARIN SODIUM 80 MG/0.8ML ~~LOC~~ SOLN
80.0000 mg | Freq: Every day | SUBCUTANEOUS | Status: DC
  Administered 2019-10-28 – 2019-11-07 (×11): 80 mg via SUBCUTANEOUS
  Filled 2019-10-28 (×11): qty 0.8

## 2019-10-28 MED ORDER — CHLORPROMAZINE HCL 25 MG PO TABS
50.0000 mg | ORAL_TABLET | Freq: Three times a day (TID) | ORAL | Status: DC
  Administered 2019-10-28 – 2019-11-10 (×37): 50 mg via ORAL
  Filled 2019-10-28 (×4): qty 2
  Filled 2019-10-28: qty 1
  Filled 2019-10-28 (×21): qty 2
  Filled 2019-10-28: qty 1
  Filled 2019-10-28 (×14): qty 2

## 2019-10-28 MED ORDER — SODIUM ZIRCONIUM CYCLOSILICATE 5 G PO PACK
5.0000 g | PACK | Freq: Once | ORAL | Status: AC
  Administered 2019-10-28: 5 g via ORAL
  Filled 2019-10-28: qty 1

## 2019-10-28 MED ORDER — CHLORHEXIDINE GLUCONATE CLOTH 2 % EX PADS
6.0000 | MEDICATED_PAD | Freq: Every day | CUTANEOUS | Status: DC
  Administered 2019-10-28 – 2019-11-05 (×9): 6 via TOPICAL

## 2019-10-28 NOTE — Consult Note (Addendum)
Tele psych Consultation   Reason for Consult:  ''Hx of Schizoaffective disorder, on QTc  Prolong psych medications, Kindly evaluate for med adjustment.'' Referring Physician: Lesia Sago, MD Location of Patient: [phone] Location of Provider: [facility]  Patient Identification: [name] MRN:  [id number] Principal Diagnosis: Schizophrenia [facility]) Diagnosis:  Principal Problem:   Schizophrenia ([place]) Active Problems:   Pneumonia due to severe acute respiratory syndrome coronavirus 2 (SARS-CoV-2)   Total Time spent with patient: 45 minutes  Subjective:   [name] is a 68 y.o. male patient admitted with shortness of breath.  HPI:  68 year old male with history of paranoid schizophrenia, prolonged QT interval, non-Hodgkin's lymphoma, hypertension, obesity, hyperlipidemia, COPD, diastolic congestive heart failure and chronic kidney disease. He was admitted to the medical unit due to shortness of breath and tested positive for SARS-CoV-2. Patient is alert, oriented, calm denies psychosis, depression and self harming thoughts. He reports long history of paranoia and states that he has been stable on his current psychiatric medications for a long time. He states that he receives his psychiatric medications from Dr. Zeb Comfort.  Past Psychiatric History: Paranoid Schizophrenia  Risk to Self:  denies Risk to Others:  denies Prior Inpatient Therapy:  none reported Prior Outpatient Therapy:  Dr. Zeb Comfort  Past Medical History:  Past Medical History:  Diagnosis Date  . Cellulitis   . CHF (congestive heart failure) ([place])   . Chronic kidney disease 09/20/2008  . Chronic lymphocytic leukemia ([place])   . cll dx'd 10/2003   no rx  . CLL (chronic lymphoblastic leukemia) 10/2003  . COPD (chronic obstructive pulmonary disease) ([place])   . GERD (gastroesophageal reflux disease)   . Hiatal hernia   . Hyperlipemia   . Hypertension   . Lower extremity edema   . NHL  (non-Hodgkin's lymphoma) ([place]) dx'd 08/2008 rt groin   chemo comp 08/2008  . Non Hodgkin's lymphoma ([place]) 08/2008  . Non Hodgkin's lymphoma ([place])   . Paranoid schizophrenia ([place])   . Prolonged Q-T interval on ECG   . RBBB 09/17/2017  . Schizoaffective disorder ([place])   . Sepsis ([place])   . Severe left ventricular hypertrophy 10/21/2017  . Vitamin D deficiency     Past Surgical History:  Procedure Laterality Date  . LYMPH NODE BIOPSY     Family History:  Family History  Problem Relation Age of Onset  . Alcohol abuse Father    Family Psychiatric  History:  Social History:  Social History   Substance and Sexual Activity  Alcohol Use No     Social History   Substance and Sexual Activity  Drug Use No    Social History   Socioeconomic History  . Marital status: Single    Spouse name: Not on file  . Number of children: Not on file  . Years of education: Not on file  . Highest education level: Not on file  Occupational History  . Not on file  Tobacco Use  . Smoking status: Never Smoker  . Smokeless tobacco: Never Used  Substance and Sexual Activity  . Alcohol use: No  . Drug use: No  . Sexual activity: Never  Other Topics Concern  . Not on file  Social History Narrative  . Not on file   Social Determinants of Health   Financial Resource Strain:   . Difficulty of Paying Living Expenses: Not on file  Food Insecurity:   . Worried About Charity fundraiser in the Last Year: Not on file  .  Ran Out of Food in the Last Year: Not on file  Transportation Needs:   . Lack of Transportation (Medical): Not on file  . Lack of Transportation (Non-Medical): Not on file  Physical Activity:   . Days of Exercise per Week: Not on file  . Minutes of Exercise per Session: Not on file  Stress:   . Feeling of Stress : Not on file  Social Connections:   . Frequency of Communication with Friends and Family: Not on file  . Frequency of Social Gatherings with Friends and Family: Not on  file  . Attends Religious Services: Not on file  . Active Member of Clubs or Organizations: Not on file  . Attends Archivist Meetings: Not on file  . Marital Status: Not on file   Additional Social History:    Allergies:   Allergies  Allergen Reactions  . Sulfamethoxazole Rash    Labs:  Results for orders placed or performed during the hospital encounter of 10/25/19 (from the past 48 hour(s))  Comprehensive metabolic panel     Status: Abnormal   Collection Time: 10/26/19  5:07 PM  Result Value Ref Range   Sodium 144 135 - 145 mmol/L   Potassium 5.1 3.5 - 5.1 mmol/L   Chloride 109 98 - 111 mmol/L   CO2 26 22 - 32 mmol/L   Glucose, Bld 138 (H) 70 - 99 mg/dL   BUN 49 (H) 8 - 23 mg/dL   Creatinine, Ser 2.63 (H) 0.61 - 1.24 mg/dL   Calcium 8.7 (L) 8.9 - 10.3 mg/dL   Total Protein 5.9 (L) 6.5 - 8.1 g/dL   Albumin 2.9 (L) 3.5 - 5.0 g/dL   AST 24 15 - 41 U/L   ALT 16 0 - 44 U/L   Alkaline Phosphatase 50 38 - 126 U/L   Total Bilirubin 0.7 0.3 - 1.2 mg/dL   GFR calc non Af Amer 24 (L) >60 mL/min   GFR calc Af Amer 28 (L) >60 mL/min   Anion gap 9 5 - 15    Comment: Performed at [facility], [place] [address] [place], [place] [address]  C-reactive protein     Status: Abnormal   Collection Time: 10/26/19  5:07 PM  Result Value Ref Range   CRP 9.9 (H) <1.0 mg/dL    Comment: Performed at [facility], [place] [address] [place], [place] [address]  Ferritin     Status: None   Collection Time: 10/26/19  5:07 PM  Result Value Ref Range   Ferritin 268 24 - 336 ng/mL    Comment: Performed at [facility], [place] [address], [place], [place] [address]  Magnesium     Status: Abnormal   Collection Time: 10/26/19  5:07 PM  Result Value Ref Range   Magnesium 2.5 (H) 1.7 - 2.4 mg/dL    Comment: Performed at [facility], [place] [address], [place], [place] [address]  Phosphorus     Status: None   Collection Time:  10/26/19  5:07 PM  Result Value Ref Range   Phosphorus 3.0 2.5 - 4.6 mg/dL    Comment: Performed at [facility], [place] [address], [place], [place] [address]  Lactate dehydrogenase     Status: Abnormal   Collection Time: 10/26/19  5:07 PM  Result Value Ref Range   LDH 228 (H) 98 - 192 U/L    Comment: Performed at [facility], [place] [address] [place],  [place] [address]  Comprehensive metabolic panel     Status: Abnormal   Collection Time: 10/27/19  3:19 AM  Result Value Ref Range   Sodium 145 135 - 145 mmol/L   Potassium 5.0 3.5 - 5.1 mmol/L   Chloride 108 98 - 111 mmol/L   CO2 23 22 - 32 mmol/L   Glucose, Bld 146 (H) 70 - 99 mg/dL   BUN 51 (H) 8 - 23 mg/dL   Creatinine, Ser 2.44 (H) 0.61 - 1.24 mg/dL   Calcium 9.5 8.9 - 10.3 mg/dL   Total Protein 6.8 6.5 - 8.1 g/dL   Albumin 3.5 3.5 - 5.0 g/dL   AST 31 15 - 41 U/L   ALT 17 0 - 44 U/L   Alkaline Phosphatase 56 38 - 126 U/L   Total Bilirubin 0.4 0.3 - 1.2 mg/dL   GFR calc non Af Amer 27 (L) >60 mL/min   GFR calc Af Amer 31 (L) >60 mL/min   Anion gap 14 5 - 15    Comment: Performed at [facility], [place] [address], [place], [place] [address]  C-reactive protein     Status: Abnormal   Collection Time: 10/27/19  3:19 AM  Result Value Ref Range   CRP 9.2 (H) <1.0 mg/dL    Comment: Performed at [facility], [place] [address], [place], [place] [address]  D-dimer, quantitative (not at [facility])     Status: Abnormal   Collection Time: 10/27/19  3:19 AM  Result Value Ref Range   D-Dimer, Quant 1.05 (H) 0.00 - 0.50 ug/mL-FEU    Comment: (NOTE) At the manufacturer cut-off of 0.50 ug/mL FEU, this assay has been documented to exclude PE with a sensitivity and negative predictive value of 97 to 99%.  At this time, this assay has not been approved by the FDA to exclude DVT/VTE. Results should be correlated with clinical presentation. Performed at [facility], [place] [address] [place], [place] [address]   Ferritin     Status: None   Collection Time: 10/27/19  3:19 AM  Result Value Ref Range   Ferritin 303 24 - 336 ng/mL    Comment: Performed at [facility], [place] [address], [place], [place] [address]  Magnesium     Status: Abnormal   Collection Time: 10/27/19  3:19 AM  Result Value Ref Range   Magnesium 2.5 (H) 1.7 - 2.4 mg/dL    Comment: Performed at [facility], [place] [address] [place], [place] [address]  Phosphorus     Status: None   Collection Time: 10/27/19  3:19 AM  Result Value Ref Range   Phosphorus 4.1 2.5 - 4.6 mg/dL    Comment: Performed at [facility], [place] [address] [place], [place] [address]  Comprehensive metabolic panel     Status: Abnormal   Collection Time: 10/28/19  2:56 AM  Result Value Ref Range   Sodium 144 135 - 145 mmol/L   Potassium 5.3 (H) 3.5 - 5.1 mmol/L   Chloride 109 98 - 111 mmol/L   CO2 26 22 - 32 mmol/L   Glucose, Bld 151 (H) 70 - 99 mg/dL   BUN 56 (H) 8 - 23 mg/dL   Creatinine, Ser 2.19 (H) 0.61 - 1.24 mg/dL   Calcium 9.3 8.9 - 10.3 mg/dL   Total Protein 5.4 (L) 6.5 - 8.1 g/dL   Albumin 2.8 (L) 3.5 - 5.0 g/dL   AST 25 15 - 41  U/L   ALT 16 0 - 44 U/L   Alkaline Phosphatase 52 38 - 126 U/L   Total Bilirubin 0.5 0.3 - 1.2 mg/dL   GFR calc non Af Amer 30 (L) >60 mL/min   GFR calc Af Amer 35 (L) >60 mL/min   Anion gap 9 5 - 15    Comment: Performed at [facility], [place] [address] [place], [place] [address]  C-reactive protein     Status: Abnormal   Collection Time: 10/28/19  2:56 AM  Result Value Ref Range   CRP 2.9 (H) <1.0 mg/dL    Comment: Performed at [facility], [place] [address] [place], [place] [address]  D-dimer, quantitative (not at [facility])     Status: Abnormal   Collection Time: 10/28/19  2:56 AM  Result Value Ref Range   D-Dimer, Quant 0.83 (H) 0.00 - 0.50 ug/mL-FEU    Comment:  (NOTE) At the manufacturer cut-off of 0.50 ug/mL FEU, this assay has been documented to exclude PE with a sensitivity and negative predictive value of 97 to 99%.  At this time, this assay has not been approved by the FDA to exclude DVT/VTE. Results should be correlated with clinical presentation. Performed at [facility], [place] [address], [place], [place] [address]   Ferritin     Status: None   Collection Time: 10/28/19  2:56 AM  Result Value Ref Range   Ferritin 257 24 - 336 ng/mL    Comment: Performed at [facility], [place] [address] [place], [place] [address]  Magnesium     Status: None   Collection Time: 10/28/19  2:56 AM  Result Value Ref Range   Magnesium 2.3 1.7 - 2.4 mg/dL    Comment: Performed at [facility], [place] [address], [place], [place] [address]  Phosphorus     Status: None   Collection Time: 10/28/19  2:56 AM  Result Value Ref Range   Phosphorus 4.1 2.5 - 4.6 mg/dL    Comment: Performed at [facility], [place] [address], [place], [place] [address]  CBC with Differential/Platelet     Status: Abnormal   Collection Time: 10/28/19  2:56 AM  Result Value Ref Range   WBC 10.8 (H) 4.0 - 10.5 K/uL   RBC 3.54 (L) 4.22 - 5.81 MIL/uL   Hemoglobin 10.8 (L) 13.0 - 17.0 g/dL   HCT 34.1 (L) 39.0 - 52.0 %   MCV 96.3 80.0 - 100.0 fL   MCH 30.5 26.0 - 34.0 pg   MCHC 31.7 30.0 - 36.0 g/dL   RDW 13.3 11.5 - 15.5 %   Platelets 206 150 - 400 K/uL   nRBC 0.0 0.0 - 0.2 %   Neutrophils Relative % 80 %   Neutro Abs 8.6 (H) 1.7 - 7.7 K/uL   Lymphocytes Relative 12 %   Lymphs Abs 1.3 0.7 - 4.0 K/uL   Monocytes Relative 6 %   Monocytes Absolute 0.7 0.1 - 1.0 K/uL   Eosinophils Relative 0 %   Eosinophils Absolute 0.0 0.0 - 0.5 K/uL   Basophils Relative 0 %   Basophils Absolute 0.0 0.0 - 0.1 K/uL   Immature Granulocytes 2 %   Abs Immature Granulocytes 0.18 (H) 0.00 - 0.07 K/uL    Comment: Performed at [facility], [place] [address] [place], [place] [address]    Medications:  Current Facility-Administered Medications  Medication Dose Route Frequency Provider Last Rate Last Admin  .  aspirin EC tablet 81 mg  81 mg Oral Daily Dana Allan I, MD   81 mg at 10/28/19 0924  . atorvastatin (LIPITOR) tablet 20 mg  20 mg Oral q1800 Dana Allan I, MD   20 mg at 10/27/19 1720  . benztropine (COGENTIN) tablet 0.5 mg  0.5 mg Oral BID Dana Allan I, MD   0.5 mg at 10/28/19 0925  . cefTRIAXone (ROCEPHIN) 1 g in sodium chloride 0.9 % 100 mL IVPB  1 g Intravenous Q24H Dana Allan I, MD 200 mL/hr at 10/27/19 2247 1 g at 10/27/19 2247  . Chlorhexidine Gluconate Cloth 2 % PADS 6 each  6 each Topical Daily Alma Friendly, MD   6 each at 10/28/19 [phone]  . cholecalciferol (VITAMIN D3) tablet 5,000 Units  5,000 Units Oral Daily Dana Allan I, MD   5,000 Units at 10/28/19 [phone]  . dexamethasone (DECADRON) injection 6 mg  6 mg Intravenous Q24H Alma Friendly, MD   6 mg at 10/28/19 0925  . docusate sodium (COLACE) capsule 100 mg  100 mg Oral Daily Dana Allan I, MD   100 mg at 10/28/19 5726  . doxycycline (VIBRA-TABS) tablet 100 mg  100 mg Oral Q12H Dana Allan I, MD   100 mg at 10/28/19 0924  . enoxaparin (LOVENOX) injection 80 mg  80 mg Subcutaneous QHS W[place]ord, Drew A, RPH      . escitalopram (LEXAPRO) tablet 10 mg  10 mg Oral QAC breakfast Dana Allan I, MD   10 mg at 10/28/19 0924  . fluticasone (FLOVENT HFA) 220 MCG/ACT inhaler 1 puff  1 puff Inhalation BID Dana Allan I, MD   1 puff at 10/27/19 2237  . folic acid (FOLVITE) tablet 1 mg  1 mg Oral Daily Dana Allan I, MD   1 mg at 10/28/19 2035  . Ipratropium-Albuterol (COMBIVENT) respimat 1 puff  1 puff Inhalation Q6H Dana Allan I, MD   1 puff at 10/28/19 0213  . lamoTRIgine (LAMICTAL) tablet 50 mg  50 mg Oral TID Dana Allan I, MD   50 mg at 10/28/19 5974  . levothyroxine  (SYNTHROID) tablet 100 mcg  100 mcg Oral Daily Dana Allan I, MD   100 mcg at 10/28/19 [phone]  . multivitamin with minerals tablet 1 tablet  1 tablet Oral Daily Dana Allan I, MD   1 tablet at 10/27/19 1139  . nystatin (MYCOSTATIN/NYSTOP) topical powder 1 Bottle  1 Bottle Topical Daily Dana Allan I, MD   1 Bottle at 10/28/19 0929  . polyethylene glycol (MIRALAX / GLYCOLAX) packet 17 g  17 g Oral Daily Dana Allan I, MD   17 g at 10/28/19 0924  . remdesivir 100 mg in sodium chloride 0.9 % 100 mL IVPB  100 mg Intravenous Daily Dorrene German, RPH 200 mL/hr at 10/28/19 0941 100 mg at 10/28/19 0941  . senna (SENOKOT) tablet 8.6 mg  1 tablet Oral Daily Dana Allan I, MD   8.6 mg at 10/28/19 4536  . thiamine (VITAMIN B-1) tablet 100 mg  100 mg Oral Daily Dana Allan I, MD   100 mg at 10/28/19 4680  . vitamin C (ASCORBIC ACID) tablet 500 mg  500 mg Oral Daily Dana Allan I, MD   500 mg at 10/28/19 0924  . zinc sulfate capsule 220 mg  220 mg Oral Daily Dana Allan I, MD   220 mg at 10/28/19 3212    Musculoskeletal: Strength & Muscle Tone: nor tested Gait & Station: not tested  Patient leans: N/A  Psychiatric Specialty Exam: Physical Exam  Psychiatric: He has a normal mood and affect. Judgment normal. His speech is delayed. He is slowed. Thought content is paranoid. Cognition and memory are normal.    Review of Systems  Psychiatric/Behavioral: Positive for dysphoric mood.    Blood pressure 125/65, pulse 88, temperature 97.9 F (36.6 C), temperature source Oral, resp. rate 16, height 6' (1.829 m), weight (!) 183.9 kg, SpO2 100 %.Body mass index is 54.99 kg/m.  General Appearance: Casual  Eye Contact:  Good  Speech:  Slow  Volume:  Decreased  Mood:  Dysphoric  Affect:  Congruent  Thought Process:  Disorganized  Orientation:  Other:  only to place, person but not to time  Thought Content:  Paranoid Ideation  Suicidal Thoughts:  No  Homicidal  Thoughts:  No  Memory:  Immediate;   Fair Recent;   Fair Remote;   Fair  Judgement:  Fair  Insight:  Fair  Psychomotor Activity:  Psychomotor Retardation  Concentration:  Concentration: Fair and Attention Span: Fair  Recall:  [employer] of Knowledge:  Fair  Language:  Fair  Akathisia:  No  Handed:  Right  AIMS (if indicated):     Assets:  Communication Skills Desire for Improvement  ADL's:  Marginal  Cognition:  WNL  Sleep:   fair     Treatment Plan Summary: 68 year old male with long history of mental illness and psychiatric medication intake. Patient reports being stable on his current medications regimen. Consult was called due to patient being on medications that can prolong QTc interval. However, we have to weigh the risk of patient decompensating if he is taking off his medications at this time vs the benefit of keeping him stable as it has been the case for a long time.  Recommendations: -Consider putting patient back on psychiatric medications for stabilization. -Consider daily EKG to monitor QTc prolongation. -Consider discontinuations of psych medications starting with Chlorpromazine, Lexapro, Benztropine and Clozapine in that order if there is QTc prolongation from the current baseline-537.  Disposition: Psychiatric service siging off. Re-consult as needed  This service was provided via telemedicine using a 2-way, interactive audio and video technology.  Names of all persons participating in this telemedicine service and their role in this encounter. Name: Margarette Asal Role: Patient  Name: Abelardo Seidner,MD Role: Psychiatrist  Name:Ezenduka Jarvis Newcomer, MD Role: Hospitalist  Name: Role:     Corena Pilgrim, MD 10/28/2019 3:30 PM

## 2019-10-28 NOTE — Progress Notes (Signed)
PROGRESS NOTE  [name] RDE:[id number] DOB: August 03, 1951 DOA: 10/25/2019 PCP: [name], FNP  HPI/Recap of past 24 hours: HPI from Dr Marthenia Rolling Patient is a 68 year old male, obese, with past medical history significant for paranoid schizophrenia, prolonged QT interval, non-Hodgkin's lymphoma, hypertension, hyperlipidemia, COPD, diastolic congestive heart failure and chronic kidney disease. As documented above, patient is a very poor historian, therefore, could not give any significant history.  Collateral information revealed that patient has developed worsening shortness of breath with O2 sat of 89%, now requiring supplemental oxygen 2 L/min via nasal cannula, associated fever and chills.  On presentation to the hospital, Covid test came back positive.  Worsening renal function is noted, with creatinine of 3.09 (up from 1.67).  Patient's medication reveals meloxicam 15 mg p.o. once daily prior to admission.  Chest x-ray revealed multifocal pneumonia. In the ED, respiratory rate of 18 to 44/min, and O2 sats of 91% on 2 L.  Hospitalist team has been called to admit patient for further assessment and management of pneumonia due to SARS-CoV-2     Today, patient denies any new complaints.  Still requiring supplemental oxygen.  Denies any worsening shortness of breath, cough, fever/chills.  Assessment/Plan: Principal Problem:   Schizophrenia ([place]) Active Problems:   Pneumonia due to severe acute respiratory syndrome coronavirus 2 (SARS-CoV-2)  Pneumonia due to COVID-19 virus Currently afebrile, with mild leukocytosis On 2 L of oxygen, saturating well above 95%, plan to start weaning Inflammatory markers elevated, will trend Procalcitonin negative Chest x-ray revealed multifocal pneumonia Continue Decadron, remdesivir Continue ceftriaxone, doxycycline Supplemental oxygen, inhalers, cough suppressant, vitamins  AKI on CKD stage IV Improving Baseline creatinine 1.7-2, on admission  3.09 On daily meloxicam at home, hold Renal ultrasound unremarkable Continue to hold off IV hydration due to Covid status for now Daily BMP  QTC prolongation EKG with prolonged QTC on admission Likely due to psych meds Spoke to psychiatrist Dr [name], recommneded keeping patient on previous psych medication regimen, as he has been on this for >30 years and was previously very difficult to manage his psych condition. Advised against changing or stopping this regimen to avoid a manic episode Daily EKG Monitor electrolytes closely  Anemia of CKD Hemoglobin slightly down from baseline Daily CBC  Chronic diastolic HF Stable Echo done in 09/2018 showed EF of 65 to 63%, with LV diastolic dysfunction Monitor closely  Hyperlipidemia Continue Lipitor  Hypothyroidism Continue Synthroid  Chronic thrombocytopenia Daily CBC  Schizoaffective disorder Continue clozapine, benztropine, Thorazine, Lamictal, Lexapro  Morbidly obese Lifestyle modification advised          Malnutrition Type:      Malnutrition Characteristics:      Nutrition Interventions:       Estimated body mass index is 54.99 kg/m as calculated from the following:   Height as of this encounter: 6' (1.829 m).   Weight as of this encounter: 183.9 kg.     Code Status: Full  Family Communication: None at bedside  Disposition Plan: To be determined   Consultants:  Psychiatry  Procedures:  None  Antimicrobials:  Ceftriaxone  Doxycycline  DVT prophylaxis: Lovenox   Objective: Vitals:   10/27/19 2011 10/27/19 2012 10/28/19 0517 10/28/19 1302  BP: 138/73  114/62 125/65  Pulse: 100  82 88  Resp:  18 20 16   Temp:  98.7 F (37.1 C) 98.2 F (36.8 C) 97.9 F (36.6 C)  TempSrc:    Oral  SpO2: 97%  92% 100%  Weight:  Height:        Intake/Output Summary (Last 24 hours) at 10/28/2019 1522 Last data filed at 10/28/2019 1300 Gross per 24 hour  Intake 1140 ml  Output 1625  ml  Net -485 ml   Filed Weights   10/26/19 1812  Weight: (!) 183.9 kg    Exam:  General: NAD, obese  Cardiovascular: S1, S2 present  Respiratory:  Diminished air entry bilaterally  Abdomen: Soft, nontender, nondistended, bowel sounds present  Musculoskeletal: No bilateral pedal edema noted  Skin: Normal  Psychiatry: Normal mood   Data Reviewed: CBC: Recent Labs  Lab 10/25/19 1853 10/28/19 0256  WBC 6.3 10.8*  NEUTROABS 4.7 8.6*  HGB 10.8* 10.8*  HCT 34.3* 34.1*  MCV 99.7 96.3  PLT 126* 542   Basic Metabolic Panel: Recent Labs  Lab 10/25/19 1853 10/26/19 1707 10/27/19 0319 10/28/19 0256  NA 145 144 145 144  K 5.1 5.1 5.0 5.3*  CL 110 109 108 109  CO2 25 26 23 26   GLUCOSE 128* 138* 146* 151*  BUN 46* 49* 51* 56*  CREATININE 3.09* 2.63* 2.44* 2.19*  CALCIUM 8.6* 8.7* 9.5 9.3  MG  --  2.5* 2.5* 2.3  PHOS  --  3.0 4.1 4.1   GFR: Estimated Creatinine Clearance: 56.4 mL/min (A) (by C-G formula based on SCr of 2.19 mg/dL (H)). Liver Function Tests: Recent Labs  Lab 10/25/19 1853 10/26/19 1707 10/27/19 0319 10/28/19 0256  AST 24 24 31 25   ALT 13 16 17 16   ALKPHOS 52 50 56 52  BILITOT 0.5 0.7 0.4 0.5  PROT 5.9* 5.9* 6.8 5.4*  ALBUMIN 3.2* 2.9* 3.5 2.8*   Recent Labs  Lab 10/25/19 1853  LIPASE 29   No results for input(s): AMMONIA in the last 168 hours. Coagulation Profile: No results for input(s): INR, PROTIME in the last 168 hours. Cardiac Enzymes: No results for input(s): CKTOTAL, CKMB, CKMBINDEX, TROPONINI in the last 168 hours. BNP (last 3 results) No results for input(s): PROBNP in the last 8760 hours. HbA1C: No results for input(s): HGBA1C in the last 72 hours. CBG: No results for input(s): GLUCAP in the last 168 hours. Lipid Profile: No results for input(s): CHOL, HDL, LDLCALC, TRIG, CHOLHDL, LDLDIRECT in the last 72 hours. Thyroid Function Tests: No results for input(s): TSH, T4TOTAL, FREET4, T3FREE, THYROIDAB in the last 72  hours. Anemia Panel: Recent Labs    10/27/19 0319 10/28/19 0256  FERRITIN 303 257   Urine analysis:    Component Value Date/Time   COLORURINE YELLOW 10/25/2019 [place] 10/25/2019 2357   LABSPEC 1.019 10/25/2019 2357   PHURINE 5.0 10/25/2019 2357   GLUCOSEU NEGATIVE 10/25/2019 2357   HGBUR NEGATIVE 10/25/2019 2357   [place] NEGATIVE 10/25/2019 2357   KETONESUR NEGATIVE 10/25/2019 2357   PROTEINUR NEGATIVE 10/25/2019 2357   UROBILINOGEN 0.2 05/09/2015 1740   NITRITE NEGATIVE 10/25/2019 2357   LEUKOCYTESUR NEGATIVE 10/25/2019 2357   Sepsis Labs: @LABRCNTIP (procalcitonin:4,lacticidven:4)  ) Recent Results (from the past 240 hour(s))  Culture, blood (routine x 2)     Status: None (Preliminary result)   Collection Time: 10/25/19 11:58 PM   Specimen: BLOOD RIGHT HAND  Result Value Ref Range Status   Specimen Description   Final    BLOOD RIGHT HAND Performed at [facility], [place] [address], [place], [place] [address]    Special Requests   Final    BOTTLES DRAWN AEROBIC AND ANAEROBIC Blood Culture results may not be optimal due to an  inadequate volume of blood received in culture bottles Performed at [facility] [address], [place], [place] [address]    Culture   Final    NO GROWTH 2 DAYS Performed at [facility] [address] [place], [place] [address]    Report Status PENDING  Incomplete  Culture, blood (routine x 2)     Status: None (Preliminary result)   Collection Time: 10/25/19 11:58 PM   Specimen: BLOOD  Result Value Ref Range Status   Specimen Description   Final    BLOOD BLOOD LEFT HAND Performed at [facility] [address] [place], [place] [address]    Special Requests   Final    BOTTLES DRAWN AEROBIC AND ANAEROBIC Blood Culture results may not be optimal due to an excessive volume of blood received in culture bottles Performed at [facility]  [address] [place], [place] [address]    Culture   Final    NO GROWTH 2 DAYS Performed at [facility] [address], [place],  [address]    Report Status PENDING  Incomplete      Studies: No results found.  Scheduled Meds: . aspirin EC  81 mg Oral Daily  . atorvastatin  20 mg Oral q1800  . benztropine  0.5 mg Oral BID  . Chlorhexidine Gluconate Cloth  6 each Topical Daily  . cholecalciferol  5,000 Units Oral Daily  . dexamethasone (DECADRON) injection  6 mg Intravenous Q24H  . docusate sodium  100 mg Oral Daily  . doxycycline  100 mg Oral Q12H  . enoxaparin (LOVENOX) injection  80 mg Subcutaneous QHS  . escitalopram  10 mg Oral QAC breakfast  . fluticasone  1 puff Inhalation BID  . folic acid  1 mg Oral Daily  . Ipratropium-Albuterol  1 puff Inhalation Q6H  . lamoTRIgine  50 mg Oral TID  . levothyroxine  100 mcg Oral Daily  . multivitamin with minerals  1 tablet Oral Daily  . nystatin  1 Bottle Topical Daily  . polyethylene glycol  17 g Oral Daily  . senna  1 tablet Oral Daily  . thiamine  100 mg Oral Daily  . vitamin C  500 mg Oral Daily  . zinc sulfate  220 mg Oral Daily    Continuous Infusions: . cefTRIAXone (ROCEPHIN)  IV 1 g (10/27/19 2247)  . remdesivir 100 mg in NS 100 mL 100 mg (10/28/19 0941)     LOS: 3 days     Alma Friendly, MD [facility]  If 7PM-7AM, please contact night-coverage [email or web address] 10/28/2019, 3:22 PM

## 2019-10-28 NOTE — Plan of Care (Addendum)
Pt has had I&O cath 3 X. If unable to void before EOS, FC will be inserted per order. On call notified that I&O insertion was difficult and a coude was warranted.  #16 Coude placed @ 0600 w/ 800 ml return.

## 2019-10-29 ENCOUNTER — Other Ambulatory Visit: Payer: Self-pay

## 2019-10-29 LAB — COMPREHENSIVE METABOLIC PANEL
ALT: 17 U/L (ref 0–44)
AST: 24 U/L (ref 15–41)
Albumin: 3.3 g/dL — ABNORMAL LOW (ref 3.5–5.0)
Alkaline Phosphatase: 52 U/L (ref 38–126)
Anion gap: 9 (ref 5–15)
BUN: 47 mg/dL — ABNORMAL HIGH (ref 8–23)
CO2: 24 mmol/L (ref 22–32)
Calcium: 9.5 mg/dL (ref 8.9–10.3)
Chloride: 108 mmol/L (ref 98–111)
Creatinine, Ser: 1.91 mg/dL — ABNORMAL HIGH (ref 0.61–1.24)
GFR calc Af Amer: 41 mL/min — ABNORMAL LOW (ref >60)
GFR calc non Af Amer: 36 mL/min — ABNORMAL LOW (ref >60)
Glucose, Bld: 124 mg/dL — ABNORMAL HIGH (ref 70–99)
Potassium: 5 mmol/L (ref 3.5–5.1)
Sodium: 141 mmol/L (ref 135–145)
Total Bilirubin: 0.8 mg/dL (ref 0.3–1.2)
Total Protein: 5.9 g/dL — ABNORMAL LOW (ref 6.5–8.1)

## 2019-10-29 LAB — CBC WITH DIFFERENTIAL/PLATELET
Abs Immature Granulocytes: 0.32 10*3/uL — ABNORMAL HIGH (ref 0.00–0.07)
Basophils Absolute: 0.1 10*3/uL (ref 0.0–0.1)
Basophils Relative: 1 %
Eosinophils Absolute: 0 10*3/uL (ref 0.0–0.5)
Eosinophils Relative: 0 %
HCT: 38.2 % — ABNORMAL LOW (ref 39.0–52.0)
Hemoglobin: 12.5 g/dL — ABNORMAL LOW (ref 13.0–17.0)
Immature Granulocytes: 3 %
Lymphocytes Relative: 18 %
Lymphs Abs: 2.3 10*3/uL (ref 0.7–4.0)
MCH: 31.1 pg (ref 26.0–34.0)
MCHC: 32.7 g/dL (ref 30.0–36.0)
MCV: 95 fL (ref 80.0–100.0)
Monocytes Absolute: 1.2 10*3/uL — ABNORMAL HIGH (ref 0.1–1.0)
Monocytes Relative: 9 %
Neutro Abs: 9 10*3/uL — ABNORMAL HIGH (ref 1.7–7.7)
Neutrophils Relative %: 69 %
Platelets: 184 10*3/uL (ref 150–400)
RBC: 4.02 MIL/uL — ABNORMAL LOW (ref 4.22–5.81)
RDW: 13.4 % (ref 11.5–15.5)
WBC: 12.8 10*3/uL — ABNORMAL HIGH (ref 4.0–10.5)
nRBC: 0.2 % (ref 0.0–0.2)

## 2019-10-29 LAB — MAGNESIUM: Magnesium: 1.9 mg/dL (ref 1.7–2.4)

## 2019-10-29 LAB — C-REACTIVE PROTEIN: CRP: 1.4 mg/dL — ABNORMAL HIGH (ref <1.0)

## 2019-10-29 LAB — FERRITIN: Ferritin: 255 ng/mL (ref 24–336)

## 2019-10-29 LAB — PHOSPHORUS: Phosphorus: 3.8 mg/dL (ref 2.5–4.6)

## 2019-10-29 LAB — D-DIMER, QUANTITATIVE: D-Dimer, Quant: 0.93 ug/mL-FEU — ABNORMAL HIGH (ref 0.00–0.50)

## 2019-10-29 MED ORDER — MAGNESIUM SULFATE 2 GM/50ML IV SOLN
2.0000 g | Freq: Once | INTRAVENOUS | Status: AC
  Administered 2019-10-29: 2 g via INTRAVENOUS
  Filled 2019-10-29: qty 50

## 2019-10-29 NOTE — Progress Notes (Addendum)
PROGRESS NOTE  [name]:[id number] DOB: 1951/10/14 DOA: 10/25/2019 PCP: [name], FNP  HPI/Recap of past 24 hours: HPI from Dr Marthenia Rolling Patient is a 68 year old male, obese, with past medical history significant for paranoid schizophrenia, prolonged QT interval, non-Hodgkin's lymphoma, hypertension, hyperlipidemia, COPD, diastolic congestive heart failure and chronic kidney disease. As documented above, patient is a very poor historian, therefore, could not give any significant history.  Collateral information revealed that patient has developed worsening shortness of breath with O2 sat of 89%, now requiring supplemental oxygen 2 L/min via nasal cannula, associated fever and chills.  On presentation to the hospital, Covid test came back positive.  Worsening renal function is noted, with creatinine of 3.09 (up from 1.67).  Patient's medication reveals meloxicam 15 mg p.o. once daily prior to admission.  Chest x-ray revealed multifocal pneumonia. In the ED, respiratory rate of 18 to 44/min, and O2 sats of 91% on 2 L.  Hospitalist team has been called to admit patient for further assessment and management of pneumonia due to SARS-CoV-2     Today, patient denies any new complaints, appears to be stable but still requiring oxygen  Assessment/Plan: Principal Problem:   Schizophrenia ([place]) Active Problems:   Pneumonia due to severe acute respiratory syndrome coronavirus 2 (SARS-CoV-2)  Pneumonia due to COVID-19 virus Currently afebrile, with leukocytosis (on steroids) On 2 L of oxygen, saturating well above 95%, plan to start weaning Inflammatory markers elevated, trending down, continue to monitor Procalcitonin negative Chest x-ray revealed multifocal pneumonia Continue Decadron, completed remdesivir on 10/29/19 Continue ceftriaxone, doxycycline Supplemental oxygen, inhalers, cough suppressant, vitamins  AKI on CKD stage IV Improving Baseline creatinine 1.7-2, on admission  3.09 On daily meloxicam at home, hold Renal ultrasound unremarkable Continue to hold off IV hydration due to Covid status for now Daily BMP  QTC prolongation EKG with prolonged QTC on admission Likely due to psych meds Spoke to psychiatrist Dr [name], recommneded keeping patient on previous psych medication regimen, as he has been on this for >30 years and was previously very difficult to manage his psych condition. Advised against changing or stopping this regimen to avoid a manic episode Daily EKG Monitor electrolytes closely  Anemia of CKD Hemoglobin at baseline Daily CBC  Chronic diastolic HF Stable Echo done in 09/2018 showed EF of 65 to 50%, with LV diastolic dysfunction Monitor closely  Hyperlipidemia Continue Lipitor  Hypothyroidism Continue Synthroid  Chronic thrombocytopenia Daily CBC  Schizoaffective disorder Continue clozapine, benztropine, Thorazine, Lamictal, Lexapro  Morbidly obese Lifestyle modification advised          Malnutrition Type:      Malnutrition Characteristics:      Nutrition Interventions:       Estimated body mass index is 54.99 kg/m as calculated from the following:   Height as of this encounter: 6' (1.829 m).   Weight as of this encounter: 183.9 kg.     Code Status: Full  Family Communication: None at bedside  Disposition Plan: To be determined   Consultants:  Psychiatry  Procedures:  None  Antimicrobials:  Ceftriaxone  Doxycycline  DVT prophylaxis: Lovenox   Objective: Vitals:   10/28/19 1302 10/28/19 2012 10/29/19 0406 10/29/19 1333  BP: 125/65 131/63 135/65 119/63  Pulse: 88 85 87 98  Resp: 16 20 16 18   Temp: 97.9 F (36.6 C) 97.7 F (36.5 C) 98.2 F (36.8 C) 97.6 F (36.4 C)  TempSrc: Oral Oral Oral Oral  SpO2: 100% 98% 97% (!) 84%  Weight:  Height:        Intake/Output Summary (Last 24 hours) at 10/29/2019 1556 Last data filed at 10/29/2019 1338 Gross per 24 hour    Intake 580 ml  Output 2500 ml  Net -1920 ml   Filed Weights   10/26/19 1812  Weight: (!) 183.9 kg    Exam:  General: NAD, obese  Cardiovascular: S1, S2 present  Respiratory:  Diminished air entry bilaterally  Abdomen: Soft, nontender, nondistended, bowel sounds present  Musculoskeletal: No bilateral pedal edema noted  Skin: Normal  Psychiatry: Normal mood   Data Reviewed: CBC: Recent Labs  Lab 10/25/19 1853 10/28/19 0256 10/29/19 0834  WBC 6.3 10.8* 12.8*  NEUTROABS 4.7 8.6* 9.0*  HGB 10.8* 10.8* 12.5*  HCT 34.3* 34.1* 38.2*  MCV 99.7 96.3 95.0  PLT 126* 206 329   Basic Metabolic Panel: Recent Labs  Lab 10/25/19 1853 10/26/19 1707 10/27/19 0319 10/28/19 0256 10/29/19 0834  NA 145 144 145 144 141  K 5.1 5.1 5.0 5.3* 5.0  CL 110 109 108 109 108  CO2 25 26 23 26 24   GLUCOSE 128* 138* 146* 151* 124*  BUN 46* 49* 51* 56* 47*  CREATININE 3.09* 2.63* 2.44* 2.19* 1.91*  CALCIUM 8.6* 8.7* 9.5 9.3 9.5  MG  --  2.5* 2.5* 2.3 1.9  PHOS  --  3.0 4.1 4.1 3.8   GFR: Estimated Creatinine Clearance: 64.6 mL/min (A) (by C-G formula based on SCr of 1.91 mg/dL (H)). Liver Function Tests: Recent Labs  Lab 10/25/19 1853 10/26/19 1707 10/27/19 0319 10/28/19 0256 10/29/19 0834  AST 24 24 31 25 24   ALT 13 16 17 16 17   ALKPHOS 52 50 56 52 52  BILITOT 0.5 0.7 0.4 0.5 0.8  PROT 5.9* 5.9* 6.8 5.4* 5.9*  ALBUMIN 3.2* 2.9* 3.5 2.8* 3.3*   Recent Labs  Lab 10/25/19 1853  LIPASE 29   No results for input(s): AMMONIA in the last 168 hours. Coagulation Profile: No results for input(s): INR, PROTIME in the last 168 hours. Cardiac Enzymes: No results for input(s): CKTOTAL, CKMB, CKMBINDEX, TROPONINI in the last 168 hours. BNP (last 3 results) No results for input(s): PROBNP in the last 8760 hours. HbA1C: No results for input(s): HGBA1C in the last 72 hours. CBG: No results for input(s): GLUCAP in the last 168 hours. Lipid Profile: No results for input(s):  CHOL, HDL, LDLCALC, TRIG, CHOLHDL, LDLDIRECT in the last 72 hours. Thyroid Function Tests: No results for input(s): TSH, T4TOTAL, FREET4, T3FREE, THYROIDAB in the last 72 hours. Anemia Panel: Recent Labs    10/28/19 0256 10/29/19 0552  FERRITIN 257 255   Urine analysis:    Component Value Date/Time   COLORURINE YELLOW 10/25/2019 2357   APPEARANCEUR CLEAR 10/25/2019 2357   LABSPEC 1.019 10/25/2019 2357   PHURINE 5.0 10/25/2019 2357   GLUCOSEU NEGATIVE 10/25/2019 2357   HGBUR NEGATIVE 10/25/2019 2357   [place] NEGATIVE 10/25/2019 2357   KETONESUR NEGATIVE 10/25/2019 2357   PROTEINUR NEGATIVE 10/25/2019 2357   UROBILINOGEN 0.2 05/09/2015 1740   NITRITE NEGATIVE 10/25/2019 2357   LEUKOCYTESUR NEGATIVE 10/25/2019 2357   Sepsis Labs: @LABRCNTIP (procalcitonin:4,lacticidven:4)  ) Recent Results (from the past 240 hour(s))  Respiratory Panel by PCR     Status: None   Collection Time: 10/25/19 10:29 PM   Specimen: Nasopharyngeal Swab; Respiratory  Result Value Ref Range Status   Adenovirus NOT DETECTED NOT DETECTED Final   Coronavirus 229E NOT DETECTED NOT DETECTED Final    Comment: (NOTE) The Coronavirus on the  Respiratory Panel, DOES NOT test for the novel  Coronavirus (2019 nCoV)    Coronavirus HKU1 NOT DETECTED NOT DETECTED Final   Coronavirus NL63 NOT DETECTED NOT DETECTED Final   Coronavirus OC43 NOT DETECTED NOT DETECTED Final   Metapneumovirus NOT DETECTED NOT DETECTED Final   Rhinovirus / Enterovirus NOT DETECTED NOT DETECTED Final   Influenza A NOT DETECTED NOT DETECTED Final   Influenza B NOT DETECTED NOT DETECTED Final   Parainfluenza Virus 1 NOT DETECTED NOT DETECTED Final   Parainfluenza Virus 2 NOT DETECTED NOT DETECTED Final   Parainfluenza Virus 3 NOT DETECTED NOT DETECTED Final   Parainfluenza Virus 4 NOT DETECTED NOT DETECTED Final   Respiratory Syncytial Virus NOT DETECTED NOT DETECTED Final   Bordetella pertussis NOT DETECTED NOT DETECTED Final    Chlamydophila pneumoniae NOT DETECTED NOT DETECTED Final   Mycoplasma pneumoniae NOT DETECTED NOT DETECTED Final    Comment: Performed at [facility], [place] [address] [place], [place] [address]  Culture, blood (routine x 2)     Status: None (Preliminary result)   Collection Time: 10/25/19 11:58 PM   Specimen: BLOOD RIGHT HAND  Result Value Ref Range Status   Specimen Description   Final    BLOOD RIGHT HAND Performed at [facility], [place] [address], [place], [place] [address]    Special Requests   Final    BOTTLES DRAWN AEROBIC AND ANAEROBIC Blood Culture results may not be optimal due to an inadequate volume of blood received in culture bottles Performed at [facility] [address], [place], [place] [address]    Culture   Final    NO GROWTH 3 DAYS Performed at [facility], [place] [address] [place], [place] [address]    Report Status PENDING  Incomplete  Culture, blood (routine x 2)     Status: None (Preliminary result)   Collection Time: 10/25/19 11:58 PM   Specimen: BLOOD  Result Value Ref Range Status   Specimen Description   Final    BLOOD BLOOD LEFT HAND Performed at [facility] [address] [place], [place] [address]    Special Requests   Final    BOTTLES DRAWN AEROBIC AND ANAEROBIC Blood Culture results may not be optimal due to an excessive volume of blood received in culture bottles Performed at [facility] [address] [place], [place] [address]    Culture   Final    NO GROWTH 3 DAYS Performed at [facility], [place] [address], [place], [place] [address]    Report Status PENDING  Incomplete      Studies: No results found.  Scheduled Meds: . aspirin EC  81 mg Oral Daily  . atorvastatin  20 mg Oral q1800  . benztropine  0.5 mg Oral BID  . Chlorhexidine Gluconate Cloth  6 each Topical Daily  . chlorproMAZINE  100 mg Oral QHS  . chlorproMAZINE  50 mg Oral TID  .  cholecalciferol  5,000 Units Oral Daily  . cloZAPine  150 mg Oral BID  . dexamethasone (DECADRON) injection  6 mg Intravenous Q24H  . docusate sodium  100 mg Oral Daily  . doxycycline  100 mg Oral Q12H  . enoxaparin (LOVENOX) injection  80 mg Subcutaneous QHS  . escitalopram  10 mg Oral QAC breakfast  . fluticasone  1 puff Inhalation BID  . folic acid  1 mg Oral Daily  . Ipratropium-Albuterol  1 puff  Inhalation Q6H  . lamoTRIgine  50 mg Oral TID  . levothyroxine  100 mcg Oral Daily  . multivitamin with minerals  1 tablet Oral Daily  . nystatin  1 Bottle Topical Daily  . polyethylene glycol  17 g Oral Daily  . senna  1 tablet Oral Daily  . thiamine  100 mg Oral Daily  . vitamin C  500 mg Oral Daily  . zinc sulfate  220 mg Oral Daily    Continuous Infusions: . cefTRIAXone (ROCEPHIN)  IV 1 g (10/28/19 2302)     LOS: 4 days     Alma Friendly, MD [facility]  If 7PM-7AM, please contact night-coverage [email or web address] 10/29/2019, 3:56 PM

## 2019-10-30 LAB — COMPREHENSIVE METABOLIC PANEL
ALT: 18 U/L (ref 0–44)
AST: 18 U/L (ref 15–41)
Albumin: 3.1 g/dL — ABNORMAL LOW (ref 3.5–5.0)
Alkaline Phosphatase: 60 U/L (ref 38–126)
Anion gap: 12 (ref 5–15)
BUN: 47 mg/dL — ABNORMAL HIGH (ref 8–23)
CO2: 25 mmol/L (ref 22–32)
Calcium: 9.7 mg/dL (ref 8.9–10.3)
Chloride: 105 mmol/L (ref 98–111)
Creatinine, Ser: 2.02 mg/dL — ABNORMAL HIGH (ref 0.61–1.24)
GFR calc Af Amer: 39 mL/min — ABNORMAL LOW (ref >60)
GFR calc non Af Amer: 33 mL/min — ABNORMAL LOW (ref >60)
Glucose, Bld: 183 mg/dL — ABNORMAL HIGH (ref 70–99)
Potassium: 4.7 mmol/L (ref 3.5–5.1)
Sodium: 142 mmol/L (ref 135–145)
Total Bilirubin: 0.3 mg/dL (ref 0.3–1.2)
Total Protein: 5.7 g/dL — ABNORMAL LOW (ref 6.5–8.1)

## 2019-10-30 LAB — CBC WITH DIFFERENTIAL/PLATELET
Abs Immature Granulocytes: 0.39 10*3/uL — ABNORMAL HIGH (ref 0.00–0.07)
Basophils Absolute: 0.1 10*3/uL (ref 0.0–0.1)
Basophils Relative: 0 %
Eosinophils Absolute: 0 10*3/uL (ref 0.0–0.5)
Eosinophils Relative: 0 %
HCT: 35.2 % — ABNORMAL LOW (ref 39.0–52.0)
Hemoglobin: 11.5 g/dL — ABNORMAL LOW (ref 13.0–17.0)
Immature Granulocytes: 3 %
Lymphocytes Relative: 9 %
Lymphs Abs: 1.4 10*3/uL (ref 0.7–4.0)
MCH: 31.7 pg (ref 26.0–34.0)
MCHC: 32.7 g/dL (ref 30.0–36.0)
MCV: 97 fL (ref 80.0–100.0)
Monocytes Absolute: 0.7 10*3/uL (ref 0.1–1.0)
Monocytes Relative: 5 %
Neutro Abs: 12.7 10*3/uL — ABNORMAL HIGH (ref 1.7–7.7)
Neutrophils Relative %: 83 %
Platelets: 204 10*3/uL (ref 150–400)
RBC: 3.63 MIL/uL — ABNORMAL LOW (ref 4.22–5.81)
RDW: 13.2 % (ref 11.5–15.5)
WBC: 15.2 10*3/uL — ABNORMAL HIGH (ref 4.0–10.5)
nRBC: 0.1 % (ref 0.0–0.2)

## 2019-10-30 LAB — MAGNESIUM: Magnesium: 1.9 mg/dL (ref 1.7–2.4)

## 2019-10-30 LAB — PHOSPHORUS: Phosphorus: 4.3 mg/dL (ref 2.5–4.6)

## 2019-10-30 LAB — FERRITIN: Ferritin: 195 ng/mL (ref 24–336)

## 2019-10-30 LAB — D-DIMER, QUANTITATIVE: D-Dimer, Quant: 0.5 ug/mL-FEU (ref 0.00–0.50)

## 2019-10-30 LAB — C-REACTIVE PROTEIN: CRP: 0.9 mg/dL (ref <1.0)

## 2019-10-30 MED ORDER — MAGNESIUM SULFATE 2 GM/50ML IV SOLN
2.0000 g | Freq: Once | INTRAVENOUS | Status: AC
  Administered 2019-10-30: 2 g via INTRAVENOUS
  Filled 2019-10-30: qty 50

## 2019-10-30 MED ORDER — METOPROLOL TARTRATE 25 MG PO TABS
12.5000 mg | ORAL_TABLET | Freq: Two times a day (BID) | ORAL | Status: DC
  Administered 2019-10-30 – 2019-11-10 (×22): 12.5 mg via ORAL
  Filled 2019-10-30 (×22): qty 1

## 2019-10-30 NOTE — Progress Notes (Signed)
Patient's legal guardian- Gildardo Pounds updated with plan of care.

## 2019-10-30 NOTE — NC FL2 (Signed)
[place] LEVEL OF CARE SCREENING TOOL     IDENTIFICATION  Patient Name: [name] Birthdate: 07/04/1951 Sex: male Admission Date (Current Location): 10/25/2019  [facility] and [place] Number:  Herbalist and Address:         Provider Number: [id number]  Attending Physician Name and Address:  Alma Friendly, MD  Relative Name and Phone Number:  Jari Favre [phone] Legal Guardian    Current Level of Care: Hospital Recommended Level of Care: [place] Prior Approval Number:    Date Approved/Denied:   PASRR Number:    Discharge Plan: SNF    Current Diagnoses: Patient Active Problem List   Diagnosis Date Noted  . Pneumonia due to severe acute respiratory syndrome coronavirus 2 (SARS-CoV-2) 10/25/2019  . Small bowel obstruction ([place]) 02/19/2018  . Cellulitis 12/02/2017  . Severe left ventricular hypertrophy 10/21/2017  . Stage II pressure injury of buttocks 10/03/2017  . Obesity (BMI 30-39.9) 10/03/2017  . Hyperlipidemia 10/03/2017  . Hypothyroidism 10/03/2017  . RBBB 09/17/2017  . Lobar pneumonia ([place]) 02/12/2016  . Cellulitis of right lower extremity 03/28/2015  . Sepsis ([place]) 03/27/2015  . Prolonged QT interval   . CLL (chronic lymphocytic leukemia) ([place]) 09/12/2013  . Non Hodgkin's lymphoma ([place]) 09/12/2013  . Leukocytosis 12/24/2012  . Anemia 12/24/2012  . CKD (chronic kidney disease), stage III 12/24/2012  . Schizophrenia ([place]) 12/24/2012    Orientation RESPIRATION BLADDER Height & Weight     Self, Time, Place  O2([place] O2 2L) External catheter Weight: (!) 183.9 kg Height:  6' (182.9 cm)  BEHAVIORAL SYMPTOMS/MOOD NEUROLOGICAL BOWEL NUTRITION STATUS      Incontinent Diet(Regular diet)  AMBULATORY STATUS COMMUNICATION OF NEEDS Skin   Extensive Assist Verbally Normal                       Personal Care Assistance Level of Assistance  Bathing, Feeding, Dressing Bathing Assistance: Limited  assistance Feeding assistance: Independent Dressing Assistance: Limited assistance     Functional Limitations Info  Sight, Hearing, Speech Sight Info: Adequate Hearing Info: Adequate Speech Info: Impaired(difficult speaking)    SPECIAL CARE FACTORS FREQUENCY                       Contractures Contractures Info: Not present    Additional Factors Info  Code Status, Allergies Code Status Info: FULL Allergies Info: Sulfamethoxazole           Current Medications (10/30/2019):  This is the current hospital active medication list Current Facility-Administered Medications  Medication Dose Route Frequency Provider Last Rate Last Admin  . aspirin EC tablet 81 mg  81 mg Oral Daily Dana Allan I, MD   81 mg at 10/30/19 0017  . atorvastatin (LIPITOR) tablet 20 mg  20 mg Oral q1800 Dana Allan I, MD   20 mg at 10/29/19 2213  . benztropine (COGENTIN) tablet 0.5 mg  0.5 mg Oral BID Dana Allan I, MD   0.5 mg at 10/30/19 4944  . cefTRIAXone (ROCEPHIN) 1 g in sodium chloride 0.9 % 100 mL IVPB  1 g Intravenous Q24H Dana Allan I, MD 200 mL/hr at 10/29/19 2236 1 g at 10/29/19 2236  . Chlorhexidine Gluconate Cloth 2 % PADS 6 each  6 each Topical Daily Alma Friendly, MD   6 each at 10/30/19 [phone]  . chlorproMAZINE (THORAZINE) tablet 100 mg  100 mg Oral QHS Alma Friendly, MD   100 mg  at 10/29/19 2212  . chlorproMAZINE (THORAZINE) tablet 50 mg  50 mg Oral TID Alma Friendly, MD   50 mg at 10/30/19 0926  . cholecalciferol (VITAMIN D3) tablet 5,000 Units  5,000 Units Oral Daily Dana Allan I, MD   5,000 Units at 10/30/19 [phone]  . cloZAPine (CLOZARIL) tablet 150 mg  150 mg Oral BID Alma Friendly, MD   150 mg at 10/30/19 0929  . dexamethasone (DECADRON) injection 6 mg  6 mg Intravenous Q24H Alma Friendly, MD   6 mg at 10/30/19 0930  . docusate sodium (COLACE) capsule 100 mg  100 mg Oral Daily Dana Allan I, MD   100 mg at 10/30/19  [phone]  . doxycycline (VIBRA-TABS) tablet 100 mg  100 mg Oral Q12H Dana Allan I, MD   100 mg at 10/30/19 0553  . enoxaparin (LOVENOX) injection 80 mg  80 mg Subcutaneous QHS Polly Cobia, RPH   80 mg at 10/29/19 2213  . escitalopram (LEXAPRO) tablet 10 mg  10 mg Oral QAC breakfast Dana Allan I, MD   10 mg at 10/30/19 0553  . fluticasone (FLOVENT HFA) 220 MCG/ACT inhaler 1 puff  1 puff Inhalation BID Dana Allan I, MD   1 puff at 10/30/19 0554  . folic acid (FOLVITE) tablet 1 mg  1 mg Oral Daily Dana Allan I, MD   1 mg at 10/30/19 6811  . Ipratropium-Albuterol (COMBIVENT) respimat 1 puff  1 puff Inhalation Q6H Dana Allan I, MD   1 puff at 10/30/19 0930  . lamoTRIgine (LAMICTAL) tablet 50 mg  50 mg Oral TID Dana Allan I, MD   50 mg at 10/30/19 5726  . levothyroxine (SYNTHROID) tablet 100 mcg  100 mcg Oral Daily Dana Allan I, MD   100 mcg at 10/30/19 0553  . multivitamin with minerals tablet 1 tablet  1 tablet Oral Daily Dana Allan I, MD   1 tablet at 10/30/19 2035  . nystatin (MYCOSTATIN/NYSTOP) topical powder 1 Bottle  1 Bottle Topical Daily Dana Allan I, MD   1 Bottle at 10/30/19 0931  . polyethylene glycol (MIRALAX / GLYCOLAX) packet 17 g  17 g Oral Daily Dana Allan I, MD   17 g at 10/30/19 0927  . senna (SENOKOT) tablet 8.6 mg  1 tablet Oral Daily Dana Allan I, MD   8.6 mg at 10/30/19 0927  . thiamine (VITAMIN B-1) tablet 100 mg  100 mg Oral Daily Dana Allan I, MD   100 mg at 10/30/19 5974  . vitamin C (ASCORBIC ACID) tablet 500 mg  500 mg Oral Daily Dana Allan I, MD   500 mg at 10/30/19 1638  . zinc sulfate capsule 220 mg  220 mg Oral Daily Dana Allan I, MD   220 mg at 10/30/19 1210     Discharge Medications: Please see discharge summary for a list of discharge medications.  Relevant Imaging Results:  Relevant Lab Results:   Additional Information [id number]  Purcell Mouton,  RN

## 2019-10-30 NOTE — TOC Progression Note (Signed)
Transition of Care [facility]) - Progression Note    Patient Details  Name: [name] MRN: [id number] Date of Birth: [birth year]  [facility] CM/SW Contact  Purcell Mouton, RN Phone Number: 10/30/2019, 10:49 AM  Clinical Narrative:    Spoke with Legal guardian Gildardo Pounds [phone] pt's discharge plans. [name] would like for pt to go to a SNF. Also would like MDA to call her for an update. Explained to [name] that SNF may or may not take pt. Will Continue to follow. Pt will need a PASRR for SNF level II. Will apply for PASRR now.         Expected Discharge Plan and Services                                                 Social Determinants of Health (SDOH) Interventions    Readmission Risk Interventions Readmission Risk Prevention Plan 07/20/2018  Transportation Screening Complete  PCP or Specialist Appt within 3-5 Days Complete  Home Care Screening Complete  HRI or Home Care Consult Complete  Social Work Consult for [place] Planning/Counseling Complete  Palliative Care Screening Complete  Medication Review Press photographer) Complete  Some recent data might be hidden

## 2019-10-30 NOTE — Evaluation (Signed)
Physical Therapy Evaluation Patient Details Name: [name] MRN: [id number] DOB: 04-16-1951 Today's Date: 10/30/2019   History of Present Illness  68 yo male admitted with COVID (+). Hx of schizoaffective d/o, NHL, CKD, obesity, CHF, COPD. Pt was admitted from a group home.  Clinical Impression  On eval, pt required Min assist +2 for mobility. He walked ~12 feet across room using a RW. Pt presents with general weakness, decreased activity tolerance, and impaired gait and balance. Per chart review, guardian is requesting ST rehab at [facility]. Will follow and progress activity as able.     Follow Up Recommendations SNF    Equipment Recommendations  None recommended by PT    Recommendations for Other Services       Precautions / Restrictions Precautions Precautions: Fall Restrictions Weight Bearing Restrictions: No      Mobility  Bed Mobility Overal bed mobility: Needs Assistance Bed Mobility: Supine to Sit;Sit to Supine     Supine to sit: Min assist;+2 for physical assistance;+2 for safety/equipment;HOB elevated Sit to supine: Mod assist   General bed mobility comments: Assist for trunk and to scoot to EOB. Increased time. Posterior LOB x 1  Transfers Overall transfer level: Needs assistance Equipment used: Rolling walker (2 wheeled) Transfers: Sit to/from Stand Sit to Stand: Min assist;+2 physical assistance;+2 safety/equipment;From elevated surface         General transfer comment: Assist to rise, stabilize, control descent. VCs safety, technique, hand placement.  Ambulation/Gait Ambulation/Gait assistance: Min assist;+2 physical assistance;+2 safety/equipment Gait Distance (Feet): 12 Feet Assistive device: Rolling walker (2 wheeled) Gait Pattern/deviations: Step-through pattern;Decreased stride length     General Gait Details: Assist to stabilize pt and manage RW safely. O2 sat 90% on RA. Pt fatigues fairly easily.  Stairs            Wheelchair  Mobility    Modified Rankin (Stroke Patients Only)       Balance Overall balance assessment: Needs assistance         Standing balance support: Bilateral upper extremity supported Standing balance-Leahy Scale: Poor                               Pertinent Vitals/Pain Pain Assessment: Faces Faces Pain Scale: Hurts little more Pain Location: groin area Pain Descriptors / Indicators: Discomfort;Sore Pain Intervention(s): Monitored during session;Repositioned    Home Living Family/patient expects to be discharged to:: Unsure     Type of Home: Group Home                Prior Function Level of Independence: Needs assistance   Gait / Transfers Assistance Needed: ambulatory-possibly with a RW           Hand Dominance        Extremity/Trunk Assessment   Upper Extremity Assessment Upper Extremity Assessment: Generalized weakness    Lower Extremity Assessment Lower Extremity Assessment: Generalized weakness    Cervical / Trunk Assessment Cervical / Trunk Assessment: Normal  Communication   Communication: No difficulties  Cognition Arousal/Alertness: Awake/alert Behavior During Therapy: WFL for tasks assessed/performed Overall Cognitive Status: History of cognitive impairments - at baseline                                        General Comments      Exercises     Assessment/Plan  PT Assessment Patient needs continued PT services  PT Problem List Decreased strength;Decreased mobility;Decreased balance;Decreased knowledge of use of DME;Decreased activity tolerance;Decreased cognition       PT Treatment Interventions DME instruction;Gait training;Therapeutic exercise;Therapeutic activities;Patient/family education;Balance training;Functional mobility training    PT Goals (Current goals can be found in the Care Plan section)  Acute Rehab PT Goals Patient Stated Goal: less pain/discomfort PT Goal Formulation: With  patient Time For Goal Achievement: 11/13/19 Potential to Achieve Goals: Fair    Frequency Min 3X/week   Barriers to discharge        Co-evaluation               AM-PAC PT "6 Clicks" Mobility  Outcome Measure Help needed turning from your back to your side while in a flat bed without using bedrails?: A Little Help needed moving from lying on your back to sitting on the side of a flat bed without using bedrails?: A Lot Help needed moving to and from a bed to a chair (including a wheelchair)?: A Lot Help needed standing up from a chair using your arms (e.g., wheelchair or bedside chair)?: A Lot Help needed to walk in hospital room?: A Lot Help needed climbing 3-5 steps with a railing? : A Lot 6 Click Score: 13    End of Session   Activity Tolerance: Patient tolerated treatment well Patient left: in bed;with call bell/phone within reach;with bed alarm set   PT Visit Diagnosis: Unsteadiness on feet (R26.81);Difficulty in walking, not elsewhere classified (R26.2);Muscle weakness (generalized) (M62.81)    Time: 1410-3013 PT Time Calculation (min) (ACUTE ONLY): 13 min   Charges:   PT Evaluation $PT Eval Moderate Complexity: [place], PT Acute Rehabilitation Services Pager: [phone] Office: [phone]

## 2019-10-30 NOTE — Progress Notes (Signed)
PROGRESS NOTE  [name] UJW:[id number] DOB: August 08, 1951 DOA: 10/25/2019 PCP: [name], FNP  HPI/Recap of past 24 hours: HPI from Dr Marthenia Rolling Patient is a 68 year old male, obese, with past medical history significant for paranoid schizophrenia, prolonged QT interval, non-Hodgkin's lymphoma, hypertension, hyperlipidemia, COPD, diastolic congestive heart failure and chronic kidney disease. As documented above, patient is a very poor historian, therefore, could not give any significant history.  Collateral information revealed that patient has developed worsening shortness of breath with O2 sat of 89%, now requiring supplemental oxygen 2 L/min via nasal cannula, associated fever and chills.  On presentation to the hospital, Covid test came back positive.  Worsening renal function is noted, with creatinine of 3.09 (up from 1.67).  Patient's medication reveals meloxicam 15 mg p.o. once daily prior to admission.  Chest x-ray revealed multifocal pneumonia. In the ED, respiratory rate of 18 to 44/min, and O2 sats of 91% on 2 L.  Hospitalist team has been called to admit patient for further assessment and management of pneumonia due to SARS-CoV-2     Today, pt denies any new complaints, noted to still be requiring O2.  Assessment/Plan: Principal Problem:   Schizophrenia ([place]) Active Problems:   Pneumonia due to severe acute respiratory syndrome coronavirus 2 (SARS-CoV-2)  Pneumonia due to COVID-19 virus Currently afebrile, with leukocytosis (on steroids) On 2 L of oxygen, saturating well above 95%, tried to wean off, but noted desaturation Inflammatory markers elevated, trending down, continue to monitor Procalcitonin negative Chest x-ray revealed multifocal pneumonia Continue Decadron, completed remdesivir on 10/29/19 Completed ceftriaxone, doxycycline Supplemental oxygen, inhalers, cough suppressant, vitamins  AKI on CKD stage IV Improving Baseline creatinine 1.7-2, on admission  3.09 On daily meloxicam at home, hold Renal ultrasound unremarkable Continue to hold off IV hydration due to Covid status for now Daily BMP  QTC prolongation EKG with prolonged QTC on admission Likely due to psych meds Spoke to psychiatrist Dr [name], recommneded keeping patient on previous psych medication regimen, as he has been on this for >30 years and was previously very difficult to manage his psych condition. Advised against changing or stopping this regimen to avoid a manic episode Daily EKG Monitor electrolytes closely  Anemia of CKD Hemoglobin at baseline Daily CBC  Chronic diastolic HF Stable Echo done in 09/2018 showed EF of 65 to 56%, with LV diastolic dysfunction Monitor closely  Hyperlipidemia Continue Lipitor  Hypothyroidism Continue Synthroid  Chronic thrombocytopenia Daily CBC  Schizoaffective disorder Continue clozapine, benztropine, Thorazine, Lamictal, Lexapro  Morbidly obese Lifestyle modification advised          Malnutrition Type:      Malnutrition Characteristics:      Nutrition Interventions:       Estimated body mass index is 54.99 kg/m as calculated from the following:   Height as of this encounter: 6' (1.829 m).   Weight as of this encounter: 183.9 kg.     Code Status: Full  Family Communication: Spoke to legal guardian on 10/30/19  Disposition Plan: SNF   Consultants:  Psychiatry  Procedures:  None  Antimicrobials: None  DVT prophylaxis: Lovenox   Objective: Vitals:   10/29/19 2019 10/30/19 0550 10/30/19 0759 10/30/19 1410  BP: 112/70 (!) 119/91 132/74 124/71  Pulse: 88 81 98 93  Resp: 16  20 18   Temp: 98.2 F (36.8 C) (!) 97.5 F (36.4 C)  98.5 F (36.9 C)  TempSrc: Oral Axillary  Oral  SpO2: 91%   93%  Weight:  Height:        Intake/Output Summary (Last 24 hours) at 10/30/2019 1743 Last data filed at 10/30/2019 1652 Gross per 24 hour  Intake 1090 ml  Output 3124 ml  Net  -2034 ml   Filed Weights   10/26/19 1812  Weight: (!) 183.9 kg    Exam:  General: NAD, obese  Cardiovascular: S1, S2 present  Respiratory: Diminished air entry b/l  Abdomen: Soft, nontender, nondistended, bowel sounds present  Musculoskeletal: No bilateral pedal edema noted  Skin: Normal  Psychiatry: Poor insight   Data Reviewed: CBC: Recent Labs  Lab 10/25/19 1853 10/28/19 0256 10/29/19 0834 10/30/19 1100  WBC 6.3 10.8* 12.8* 15.2*  NEUTROABS 4.7 8.6* 9.0* 12.7*  HGB 10.8* 10.8* 12.5* 11.5*  HCT 34.3* 34.1* 38.2* 35.2*  MCV 99.7 96.3 95.0 97.0  PLT 126* 206 184 756   Basic Metabolic Panel: Recent Labs  Lab 10/26/19 1707 10/27/19 0319 10/28/19 0256 10/29/19 0834 10/30/19 1100  NA 144 145 144 141 142  K 5.1 5.0 5.3* 5.0 4.7  CL 109 108 109 108 105  CO2 26 23 26 24 25   GLUCOSE 138* 146* 151* 124* 183*  BUN 49* 51* 56* 47* 47*  CREATININE 2.63* 2.44* 2.19* 1.91* 2.02*  CALCIUM 8.7* 9.5 9.3 9.5 9.7  MG 2.5* 2.5* 2.3 1.9 1.9  PHOS 3.0 4.1 4.1 3.8 4.3   GFR: Estimated Creatinine Clearance: 61.1 mL/min (A) (by C-G formula based on SCr of 2.02 mg/dL (H)). Liver Function Tests: Recent Labs  Lab 10/26/19 1707 10/27/19 0319 10/28/19 0256 10/29/19 0834 10/30/19 1100  AST 24 31 25 24 18   ALT 16 17 16 17 18   ALKPHOS 50 56 52 52 60  BILITOT 0.7 0.4 0.5 0.8 0.3  PROT 5.9* 6.8 5.4* 5.9* 5.7*  ALBUMIN 2.9* 3.5 2.8* 3.3* 3.1*   Recent Labs  Lab 10/25/19 1853  LIPASE 29   No results for input(s): AMMONIA in the last 168 hours. Coagulation Profile: No results for input(s): INR, PROTIME in the last 168 hours. Cardiac Enzymes: No results for input(s): CKTOTAL, CKMB, CKMBINDEX, TROPONINI in the last 168 hours. BNP (last 3 results) No results for input(s): PROBNP in the last 8760 hours. HbA1C: No results for input(s): HGBA1C in the last 72 hours. CBG: No results for input(s): GLUCAP in the last 168 hours. Lipid Profile: No results for input(s): CHOL,  HDL, LDLCALC, TRIG, CHOLHDL, LDLDIRECT in the last 72 hours. Thyroid Function Tests: No results for input(s): TSH, T4TOTAL, FREET4, T3FREE, THYROIDAB in the last 72 hours. Anemia Panel: Recent Labs    10/29/19 0552 10/30/19 1100  FERRITIN 255 195   Urine analysis:    Component Value Date/Time   COLORURINE YELLOW 10/25/2019 2357   APPEARANCEUR CLEAR 10/25/2019 2357   LABSPEC 1.019 10/25/2019 2357   PHURINE 5.0 10/25/2019 2357   GLUCOSEU NEGATIVE 10/25/2019 2357   HGBUR NEGATIVE 10/25/2019 2357   [place] NEGATIVE 10/25/2019 2357   KETONESUR NEGATIVE 10/25/2019 2357   PROTEINUR NEGATIVE 10/25/2019 2357   UROBILINOGEN 0.2 05/09/2015 1740   NITRITE NEGATIVE 10/25/2019 2357   LEUKOCYTESUR NEGATIVE 10/25/2019 2357   Sepsis Labs: @LABRCNTIP (procalcitonin:4,lacticidven:4)  ) Recent Results (from the past 240 hour(s))  Respiratory Panel by PCR     Status: None   Collection Time: 10/25/19 10:29 PM   Specimen: Nasopharyngeal Swab; Respiratory  Result Value Ref Range Status   Adenovirus NOT DETECTED NOT DETECTED Final   Coronavirus 229E NOT DETECTED NOT DETECTED Final    Comment: (NOTE) The Coronavirus  on the Respiratory Panel, DOES NOT test for the novel  Coronavirus (2019 nCoV)    Coronavirus HKU1 NOT DETECTED NOT DETECTED Final   Coronavirus NL63 NOT DETECTED NOT DETECTED Final   Coronavirus OC43 NOT DETECTED NOT DETECTED Final   Metapneumovirus NOT DETECTED NOT DETECTED Final   Rhinovirus / Enterovirus NOT DETECTED NOT DETECTED Final   Influenza A NOT DETECTED NOT DETECTED Final   Influenza B NOT DETECTED NOT DETECTED Final   Parainfluenza Virus 1 NOT DETECTED NOT DETECTED Final   Parainfluenza Virus 2 NOT DETECTED NOT DETECTED Final   Parainfluenza Virus 3 NOT DETECTED NOT DETECTED Final   Parainfluenza Virus 4 NOT DETECTED NOT DETECTED Final   Respiratory Syncytial Virus NOT DETECTED NOT DETECTED Final   Bordetella pertussis NOT DETECTED NOT DETECTED Final    Chlamydophila pneumoniae NOT DETECTED NOT DETECTED Final   Mycoplasma pneumoniae NOT DETECTED NOT DETECTED Final    Comment: Performed at [facility], [place] [address], [place], [place] [address]  Culture, blood (routine x 2)     Status: None (Preliminary result)   Collection Time: 10/25/19 11:58 PM   Specimen: BLOOD RIGHT HAND  Result Value Ref Range Status   Specimen Description   Final    BLOOD RIGHT HAND Performed at [facility], [place] [address] [place], [place] [address]    Special Requests   Final    BOTTLES DRAWN AEROBIC AND ANAEROBIC Blood Culture results may not be optimal due to an inadequate volume of blood received in culture bottles Performed at [place] [address] [place], [place] [address]    Culture   Final    NO GROWTH 4 DAYS Performed at [facility], [place] [address], [place], [place] [address]    Report Status PENDING  Incomplete  Culture, blood (routine x 2)     Status: None (Preliminary result)   Collection Time: 10/25/19 11:58 PM   Specimen: BLOOD  Result Value Ref Range Status   Specimen Description   Final    BLOOD BLOOD LEFT HAND Performed at [place] [address] [place], [place] [address]    Special Requests   Final    BOTTLES DRAWN AEROBIC AND ANAEROBIC Blood Culture results may not be optimal due to an excessive volume of blood received in culture bottles Performed at [facility] [address], [place], [place] [address]    Culture   Final    NO GROWTH 4 DAYS Performed at [facility], [place] [address] [place], [place] [address]    Report Status PENDING  Incomplete      Studies: No results found.  Scheduled Meds: . aspirin EC  81 mg Oral Daily  . atorvastatin  20 mg Oral q1800  . benztropine  0.5 mg Oral BID  . Chlorhexidine Gluconate Cloth  6 each Topical Daily  . chlorproMAZINE  100 mg Oral QHS  . chlorproMAZINE  50 mg Oral TID  .  cholecalciferol  5,000 Units Oral Daily  . cloZAPine  150 mg Oral BID  . dexamethasone (DECADRON) injection  6 mg Intravenous Q24H  . docusate sodium  100 mg Oral Daily  . doxycycline  100 mg Oral Q12H  . enoxaparin (LOVENOX) injection  80 mg Subcutaneous QHS  . escitalopram  10 mg Oral QAC breakfast  . fluticasone  1 puff Inhalation BID  . folic acid  1 mg Oral Daily  . Ipratropium-Albuterol  1 puff Inhalation Q6H  . lamoTRIgine  50 mg Oral TID  . levothyroxine  100 mcg Oral Daily  . metoprolol tartrate  12.5 mg Oral BID  . multivitamin with minerals  1 tablet Oral Daily  . nystatin  1 Bottle Topical Daily  . polyethylene glycol  17 g Oral Daily  . senna  1 tablet Oral Daily  . thiamine  100 mg Oral Daily  . vitamin C  500 mg Oral Daily  . zinc sulfate  220 mg Oral Daily    Continuous Infusions: . cefTRIAXone (ROCEPHIN)  IV 1 g (10/29/19 2236)     LOS: 5 days     Alma Friendly, MD [facility]  If 7PM-7AM, please contact night-coverage [email or web address] 10/30/2019, 5:43 PM

## 2019-10-30 NOTE — Care Management Important Message (Signed)
Important Message  Patient Details IM Letter given to [name] RN Case Manager to present to the Patient                                              Name: [name] MRN: [id number] Date of Birth: May 22, 1951   Medicare Important Message Given:  Yes     [name] 10/30/2019, 12:36 PM

## 2019-10-30 NOTE — Progress Notes (Signed)
This morning patient's oxygen saturation 84-87% on room air at rest, no SOB, reapplied 1L of oxygen sat still between 88-89%, oxygen increased to 2L, patient sat in the low 90, will continue to monitor patient.Marland Kitchen

## 2019-10-30 NOTE — Evaluation (Signed)
Occupational Therapy Evaluation Patient Details Name: [name] MRN: [id number] DOB: Nov 05, 1951 Today's Date: 10/30/2019    History of Present Illness 68 yo male admitted with COVID (+). Hx of schizoaffective d/o, NHL, CKD, obesity, CHF, COPD. Pt was admitted from a group home.   Clinical Impression   Pt admitted with COVID. Pt currently with functional limitations due to the deficits listed below (see OT Problem List).  Pt will benefit from skilled OT to increase their safety and independence with ADL and functional mobility for ADL to facilitate discharge to venue listed below.     Follow Up Recommendations  SNF - may need SNF prior to return to group home depending on progress   Equipment Recommendations  None recommended by OT       Precautions / Restrictions Precautions Precautions: Fall Restrictions Weight Bearing Restrictions: No      Mobility Bed Mobility Overal bed mobility: Needs Assistance Bed Mobility: Supine to Sit;Sit to Supine     Supine to sit: HOB elevated;Mod assist Sit to supine: Mod assist   General bed mobility comments: Assist for trunk and to scoot to EOB. Increased time. Posterior LOB x 1  Transfers Overall transfer level: Needs assistance Equipment used: 1 person hand held assist Transfers: Sit to/from [employer] Transfers Sit to Stand: From elevated surface;Mod assist         General transfer comment: Assist to rise, stabilize, control descent. VCs safety, technique, hand placement.    Balance Overall balance assessment: Needs assistance Sitting-balance support: Feet supported Sitting balance-Leahy Scale: Fair     Standing balance support: Single extremity supported Standing balance-Leahy Scale: Poor                             ADL either performed or assessed with clinical judgement   ADL Overall ADL's : Needs assistance/impaired Eating/Feeding: Minimal assistance;Sitting   Grooming: Wash/dry  hands;Wash/dry face;Sitting;Minimal assistance   Upper Body Bathing: Minimal assistance;Sitting   Lower Body Bathing: Maximal assistance;Sit to/from stand;Cueing for sequencing;Cueing for compensatory techniques   Upper Body Dressing : Minimal assistance;Sitting   Lower Body Dressing: Maximal assistance;Sit to/from stand;Cueing for safety;Cueing for sequencing       Toileting- Clothing Manipulation and Hygiene: Maximal assistance;Sit to/from stand;Cueing for safety;Cueing for sequencing               Vision Patient Visual Report: No change from baseline              Pertinent Vitals/Pain Pain Assessment: Faces Faces Pain Scale: Hurts little more Pain Location: groin area Pain Descriptors / Indicators: Discomfort;Sore Pain Intervention(s): Monitored during session;Repositioned     Hand Dominance     Extremity/Trunk Assessment Upper Extremity Assessment Upper Extremity Assessment: Generalized weakness   Lower Extremity Assessment Lower Extremity Assessment: Generalized weakness   Cervical / Trunk Assessment Cervical / Trunk Assessment: Normal   Communication Communication Communication: No difficulties   Cognition Arousal/Alertness: Awake/alert Behavior During Therapy: WFL for tasks assessed/performed Overall Cognitive Status: History of cognitive impairments - at baseline                                                Home Living Family/patient expects to be discharged to:: Unsure     Type of Home: Group Home  Prior Functioning/Environment Level of Independence: Needs assistance  Gait / Transfers Assistance Needed: ambulatory-possibly with a RW              OT Problem List: Decreased strength;Impaired balance (sitting and/or standing);Decreased activity tolerance;Decreased safety awareness;Decreased knowledge of use of DME or AE      OT Treatment/Interventions: Self-care/ADL  training;Patient/family education;DME and/or AE instruction;Therapeutic activities    OT Goals(Current goals can be found in the care plan section) Acute Rehab OT Goals Patient Stated Goal: less pain/discomfort OT Goal Formulation: With patient Time For Goal Achievement: 10/30/19  OT Frequency: Min 2X/week   Barriers to D/C:               AM-PAC OT "6 Clicks" Daily Activity     Outcome Measure Help from another person eating meals?: A Little Help from another person taking care of personal grooming?: A Little Help from another person toileting, which includes using toliet, bedpan, or urinal?: A Lot Help from another person bathing (including washing, rinsing, drying)?: A Lot Help from another person to put on and taking off regular upper body clothing?: A Little Help from another person to put on and taking off regular lower body clothing?: A Lot 6 Click Score: 15   End of Session Equipment Utilized During Treatment: Gait belt Nurse Communication: Mobility status  Activity Tolerance: Patient limited by fatigue Patient left: in bed;with call bell/phone within reach  OT Visit Diagnosis: Unsteadiness on feet (R26.81);Muscle weakness (generalized) (M62.81);Other abnormalities of gait and mobility (R26.89)                Time: 4854-6270 OT Time Calculation (min): 12 min Charges:  OT General Charges $OT Visit: 1 Visit OT Evaluation $OT Eval Low Complexity: 1 Low  Kari Baars, OT Acute Rehabilitation Services [phone] Office- [phone], Edwena Felty D 10/30/2019, 5:13 PM

## 2019-10-31 ENCOUNTER — Other Ambulatory Visit: Payer: Self-pay

## 2019-10-31 LAB — BASIC METABOLIC PANEL
Anion gap: 8 (ref 5–15)
BUN: 47 mg/dL — ABNORMAL HIGH (ref 8–23)
CO2: 25 mmol/L (ref 22–32)
Calcium: 9.5 mg/dL (ref 8.9–10.3)
Chloride: 107 mmol/L (ref 98–111)
Creatinine, Ser: 1.96 mg/dL — ABNORMAL HIGH (ref 0.61–1.24)
GFR calc Af Amer: 40 mL/min — ABNORMAL LOW (ref >60)
GFR calc non Af Amer: 35 mL/min — ABNORMAL LOW (ref >60)
Glucose, Bld: 179 mg/dL — ABNORMAL HIGH (ref 70–99)
Potassium: 5.6 mmol/L — ABNORMAL HIGH (ref 3.5–5.1)
Sodium: 140 mmol/L (ref 135–145)

## 2019-10-31 LAB — CBC WITH DIFFERENTIAL/PLATELET
Abs Immature Granulocytes: 0.63 10*3/uL — ABNORMAL HIGH (ref 0.00–0.07)
Basophils Absolute: 0.1 10*3/uL (ref 0.0–0.1)
Basophils Relative: 1 %
Eosinophils Absolute: 0 10*3/uL (ref 0.0–0.5)
Eosinophils Relative: 0 %
HCT: 34.9 % — ABNORMAL LOW (ref 39.0–52.0)
Hemoglobin: 11.2 g/dL — ABNORMAL LOW (ref 13.0–17.0)
Immature Granulocytes: 4 %
Lymphocytes Relative: 12 %
Lymphs Abs: 1.8 10*3/uL (ref 0.7–4.0)
MCH: 31 pg (ref 26.0–34.0)
MCHC: 32.1 g/dL (ref 30.0–36.0)
MCV: 96.7 fL (ref 80.0–100.0)
Monocytes Absolute: 1 10*3/uL (ref 0.1–1.0)
Monocytes Relative: 6 %
Neutro Abs: 11.5 10*3/uL — ABNORMAL HIGH (ref 1.7–7.7)
Neutrophils Relative %: 77 %
Platelets: 212 10*3/uL (ref 150–400)
RBC: 3.61 MIL/uL — ABNORMAL LOW (ref 4.22–5.81)
RDW: 13.2 % (ref 11.5–15.5)
WBC: 15 10*3/uL — ABNORMAL HIGH (ref 4.0–10.5)
nRBC: 0.2 % (ref 0.0–0.2)

## 2019-10-31 LAB — CULTURE, BLOOD (ROUTINE X 2)
Culture: NO GROWTH
Culture: NO GROWTH

## 2019-10-31 LAB — BASIC METABOLIC PANEL WITH GFR
Anion gap: 9 (ref 5–15)
BUN: 48 mg/dL — ABNORMAL HIGH (ref 8–23)
CO2: 27 mmol/L (ref 22–32)
Calcium: 9.5 mg/dL (ref 8.9–10.3)
Chloride: 106 mmol/L (ref 98–111)
Creatinine, Ser: 2.14 mg/dL — ABNORMAL HIGH (ref 0.61–1.24)
GFR calc Af Amer: 36 mL/min — ABNORMAL LOW
GFR calc non Af Amer: 31 mL/min — ABNORMAL LOW
Glucose, Bld: 169 mg/dL — ABNORMAL HIGH (ref 70–99)
Potassium: 5 mmol/L (ref 3.5–5.1)
Sodium: 142 mmol/L (ref 135–145)

## 2019-10-31 LAB — MAGNESIUM: Magnesium: 2.2 mg/dL (ref 1.7–2.4)

## 2019-10-31 MED ORDER — DEXTROSE 50 % IV SOLN
1.0000 | Freq: Once | INTRAVENOUS | Status: AC
  Administered 2019-10-31: 50 mL via INTRAVENOUS
  Filled 2019-10-31: qty 50

## 2019-10-31 MED ORDER — CALCIUM GLUCONATE-NACL 1-0.675 GM/50ML-% IV SOLN
1.0000 g | Freq: Once | INTRAVENOUS | Status: AC
  Administered 2019-10-31: 1000 mg via INTRAVENOUS
  Filled 2019-10-31: qty 50

## 2019-10-31 MED ORDER — INSULIN ASPART 100 UNIT/ML IV SOLN
10.0000 [IU] | Freq: Once | INTRAVENOUS | Status: AC
  Administered 2019-10-31: 10 [IU] via INTRAVENOUS

## 2019-10-31 NOTE — Progress Notes (Signed)
Paged [facility] floor coverage to make notification of potassium resulted at 5.6 this morning.  Awaiting orders   0500 one amp 25g 50% dextrose, 10 units novalog IV and 1000mg /75mL calcium gluconate ordered  Order for new IV placed, calcium gluconate cannot run through hand or foot access.

## 2019-10-31 NOTE — Progress Notes (Signed)
PROGRESS NOTE  [name] ZOX:[id number] DOB: 12-15-1950 DOA: 10/25/2019 PCP: [name], FNP  HPI/Recap of past 24 hours: HPI from Dr Marthenia Rolling Patient is a 68 year old male, obese, with past medical history significant for paranoid schizophrenia, prolonged QT interval, non-Hodgkin's lymphoma, hypertension, hyperlipidemia, COPD, diastolic congestive heart failure and chronic kidney disease. As documented above, patient is a very poor historian, therefore, could not give any significant history.  Collateral information revealed that patient has developed worsening shortness of breath with O2 sat of 89%, now requiring supplemental oxygen 2 L/min via nasal cannula, associated fever and chills.  On presentation to the hospital, Covid test came back positive.  Worsening renal function is noted, with creatinine of 3.09 (up from 1.67).  Patient's medication reveals meloxicam 15 mg p.o. once daily prior to admission.  Chest x-ray revealed multifocal pneumonia. In the ED, respiratory rate of 18 to 44/min, and O2 sats of 91% on 2 L.  Hospitalist team has been called to admit patient for further assessment and management of pneumonia due to SARS-CoV-2     Today pt denies any new complaints. Still requiring O2  Assessment/Plan: Principal Problem:   Schizophrenia (HCC) Active Problems:   Pneumonia due to severe acute respiratory syndrome coronavirus 2 (SARS-CoV-2)  Pneumonia due to COVID-19 virus Currently afebrile, with leukocytosis (on steroids) On 2 L of oxygen, saturating well above 95%, tried to wean off, but noted desaturation Inflammatory markers elevated, trending down, continue to monitor Procalcitonin negative Chest x-ray revealed multifocal pneumonia Continue Decadron, completed remdesivir on 10/29/19 Completed ceftriaxone, doxycycline Supplemental oxygen, inhalers, cough suppressant, vitamins  AKI on CKD stage IV Improving Baseline creatinine 1.7-2, on admission 3.09 On  daily meloxicam at home, hold Renal ultrasound unremarkable Continue to hold off IV hydration due to Covid status for now Daily BMP  QTC prolongation EKG with prolonged QTC on admission Likely due to psych meds Spoke to psychiatrist Dr [name], recommneded keeping patient on previous psych medication regimen, as he has been on this for >30 years and was previously very difficult to manage his psych condition. Advised against changing or stopping this regimen to avoid a manic episode Daily EKG Monitor electrolytes closely  Anemia of CKD Hemoglobin at baseline Daily CBC  Chronic diastolic HF Stable Echo done in 09/2018 showed EF of 65 to 81%, with LV diastolic dysfunction Monitor closely  Hyperlipidemia Continue Lipitor  Hypothyroidism Continue Synthroid  Chronic thrombocytopenia Daily CBC  Schizoaffective disorder Continue clozapine, benztropine, Thorazine, Lamictal, Lexapro  Morbidly obese Lifestyle modification advised          Malnutrition Type:      Malnutrition Characteristics:      Nutrition Interventions:       Estimated body mass index is 54.99 kg/m as calculated from the following:   Height as of this encounter: 6' (1.829 m).   Weight as of this encounter: 183.9 kg.     Code Status: Full  Family Communication: Spoke to legal guardian on 10/30/19  Disposition Plan: SNF   Consultants:  Psychiatry  Procedures:  None  Antimicrobials: None  DVT prophylaxis: Lovenox   Objective: Vitals:   10/30/19 1410 10/30/19 1934 10/31/19 1202 10/31/19 1944  BP: 124/71 123/71 114/63 130/78  Pulse: 93 96 76 73  Resp: 18 20 20 20   Temp: 98.5 F (36.9 C) 97.9 F (36.6 C) 98.1 F (36.7 C) 98 F (36.7 C)  TempSrc: Oral Oral Oral   SpO2: 93% 92% 93% 96%  Weight:      Height:  Intake/Output Summary (Last 24 hours) at 10/31/2019 2136 Last data filed at 10/31/2019 2046 Gross per 24 hour  Intake 600 ml  Output 3550 ml  Net  -2950 ml   Filed Weights   10/26/19 1812  Weight: (!) 183.9 kg    Exam:  General: NAD, obese   Cardiovascular: S1, S2 present  Respiratory: Diminished air entry b/l  Abdomen: Soft, nontender, nondistended, bowel sounds present  Musculoskeletal: No bilateral pedal edema noted  Skin: Normal  Psychiatry: Poor insight    Data Reviewed: CBC: Recent Labs  Lab 10/25/19 1853 10/28/19 0256 10/29/19 0834 10/30/19 1100 10/31/19 0338  WBC 6.3 10.8* 12.8* 15.2* 15.0*  NEUTROABS 4.7 8.6* 9.0* 12.7* 11.5*  HGB 10.8* 10.8* 12.5* 11.5* 11.2*  HCT 34.3* 34.1* 38.2* 35.2* 34.9*  MCV 99.7 96.3 95.0 97.0 96.7  PLT 126* 206 184 204 657   Basic Metabolic Panel: Recent Labs  Lab 10/26/19 1707 10/27/19 0319 10/28/19 0256 10/29/19 0834 10/30/19 1100 10/31/19 0338 10/31/19 1125  NA 144 145 144 141 142 140 142  K 5.1 5.0 5.3* 5.0 4.7 5.6* 5.0  CL 109 108 109 108 105 107 106  CO2 26 23 26 24 25 25 27   GLUCOSE 138* 146* 151* 124* 183* 179* 169*  BUN 49* 51* 56* 47* 47* 47* 48*  CREATININE 2.63* 2.44* 2.19* 1.91* 2.02* 1.96* 2.14*  CALCIUM 8.7* 9.5 9.3 9.5 9.7 9.5 9.5  MG 2.5* 2.5* 2.3 1.9 1.9  --  2.2  PHOS 3.0 4.1 4.1 3.8 4.3  --   --    GFR: Estimated Creatinine Clearance: 57.7 mL/min (A) (by C-G formula based on SCr of 2.14 mg/dL (H)). Liver Function Tests: Recent Labs  Lab 10/26/19 1707 10/27/19 0319 10/28/19 0256 10/29/19 0834 10/30/19 1100  AST 24 31 25 24 18   ALT 16 17 16 17 18   ALKPHOS 50 56 52 52 60  BILITOT 0.7 0.4 0.5 0.8 0.3  PROT 5.9* 6.8 5.4* 5.9* 5.7*  ALBUMIN 2.9* 3.5 2.8* 3.3* 3.1*   Recent Labs  Lab 10/25/19 1853  LIPASE 29   No results for input(s): AMMONIA in the last 168 hours. Coagulation Profile: No results for input(s): INR, PROTIME in the last 168 hours. Cardiac Enzymes: No results for input(s): CKTOTAL, CKMB, CKMBINDEX, TROPONINI in the last 168 hours. BNP (last 3 results) No results for input(s): PROBNP in the last 8760  hours. HbA1C: No results for input(s): HGBA1C in the last 72 hours. CBG: No results for input(s): GLUCAP in the last 168 hours. Lipid Profile: No results for input(s): CHOL, HDL, LDLCALC, TRIG, CHOLHDL, LDLDIRECT in the last 72 hours. Thyroid Function Tests: No results for input(s): TSH, T4TOTAL, FREET4, T3FREE, THYROIDAB in the last 72 hours. Anemia Panel: Recent Labs    10/29/19 0552 10/30/19 1100  FERRITIN 255 195   Urine analysis:    Component Value Date/Time   COLORURINE YELLOW 10/25/2019 2357   APPEARANCEUR CLEAR 10/25/2019 2357   LABSPEC 1.019 10/25/2019 2357   PHURINE 5.0 10/25/2019 2357   GLUCOSEU NEGATIVE 10/25/2019 2357   HGBUR NEGATIVE 10/25/2019 2357   BILIRUBINUR NEGATIVE 10/25/2019 2357   KETONESUR NEGATIVE 10/25/2019 2357   PROTEINUR NEGATIVE 10/25/2019 2357   UROBILINOGEN 0.2 05/09/2015 1740   NITRITE NEGATIVE 10/25/2019 2357   LEUKOCYTESUR NEGATIVE 10/25/2019 2357   Sepsis Labs: @LABRCNTIP (procalcitonin:4,lacticidven:4)  ) Recent Results (from the past 240 hour(s))  Respiratory Panel by PCR     Status: None   Collection Time: 10/25/19 10:29 PM   Specimen: Nasopharyngeal  Swab; Respiratory  Result Value Ref Range Status   Adenovirus NOT DETECTED NOT DETECTED Final   Coronavirus 229E NOT DETECTED NOT DETECTED Final    Comment: (NOTE) The Coronavirus on the Respiratory Panel, DOES NOT test for the novel  Coronavirus (2019 nCoV)    Coronavirus HKU1 NOT DETECTED NOT DETECTED Final   Coronavirus NL63 NOT DETECTED NOT DETECTED Final   Coronavirus OC43 NOT DETECTED NOT DETECTED Final   Metapneumovirus NOT DETECTED NOT DETECTED Final   Rhinovirus / Enterovirus NOT DETECTED NOT DETECTED Final   Influenza A NOT DETECTED NOT DETECTED Final   Influenza B NOT DETECTED NOT DETECTED Final   Parainfluenza Virus 1 NOT DETECTED NOT DETECTED Final   Parainfluenza Virus 2 NOT DETECTED NOT DETECTED Final   Parainfluenza Virus 3 NOT DETECTED NOT DETECTED Final    Parainfluenza Virus 4 NOT DETECTED NOT DETECTED Final   Respiratory Syncytial Virus NOT DETECTED NOT DETECTED Final   Bordetella pertussis NOT DETECTED NOT DETECTED Final   Chlamydophila pneumoniae NOT DETECTED NOT DETECTED Final   Mycoplasma pneumoniae NOT DETECTED NOT DETECTED Final    Comment: Performed at [facility], [place] [address], [place], [place] [address]  Culture, blood (routine x 2)     Status: None   Collection Time: 10/25/19 11:58 PM   Specimen: BLOOD RIGHT HAND  Result Value Ref Range Status   Specimen Description   Final    BLOOD RIGHT HAND Performed at [facility], [place] [address] [place], [place] [address]    Special Requests   Final    BOTTLES DRAWN AEROBIC AND ANAEROBIC Blood Culture results may not be optimal due to an inadequate volume of blood received in culture bottles Performed at [facility] [address] [place], [place] [address]    Culture   Final    NO GROWTH 5 DAYS Performed at [facility], [place] [address], [place], [place] [address]    Report Status 10/31/2019 FINAL  Final  Culture, blood (routine x 2)     Status: None   Collection Time: 10/25/19 11:58 PM   Specimen: BLOOD  Result Value Ref Range Status   Specimen Description   Final    BLOOD BLOOD LEFT HAND Performed at [facility] [address] [place], [place] [address]    Special Requests   Final    BOTTLES DRAWN AEROBIC AND ANAEROBIC Blood Culture results may not be optimal due to an excessive volume of blood received in culture bottles Performed at [facility] [address], [place], [place] [address]    Culture   Final    NO GROWTH 5 DAYS Performed at [facility], [place] [address], [place], [place] [address]    Report Status 10/31/2019 FINAL  Final      Studies: No results found.  Scheduled Meds: . aspirin EC  81 mg Oral Daily  . atorvastatin  20 mg Oral q1800  . benztropine  0.5 mg Oral  BID  . Chlorhexidine Gluconate Cloth  6 each Topical Daily  . chlorproMAZINE  100 mg Oral QHS  . chlorproMAZINE  50 mg Oral TID  . cholecalciferol  5,000 Units Oral Daily  . cloZAPine  150 mg Oral BID  . dexamethasone (DECADRON) injection  6 mg Intravenous Q24H  . docusate sodium  100 mg Oral Daily  . enoxaparin (LOVENOX) injection  80 mg Subcutaneous QHS  . escitalopram  10 mg Oral QAC  breakfast  . fluticasone  1 puff Inhalation BID  . folic acid  1 mg Oral Daily  . Ipratropium-Albuterol  1 puff Inhalation Q6H  . lamoTRIgine  50 mg Oral TID  . levothyroxine  100 mcg Oral Daily  . metoprolol tartrate  12.5 mg Oral BID  . multivitamin with minerals  1 tablet Oral Daily  . nystatin  1 Bottle Topical Daily  . polyethylene glycol  17 g Oral Daily  . senna  1 tablet Oral Daily  . thiamine  100 mg Oral Daily  . vitamin C  500 mg Oral Daily  . zinc sulfate  220 mg Oral Daily    Continuous Infusions:    LOS: 6 days     Alma Friendly, MD [facility]  If 7PM-7AM, please contact night-coverage [email or web address] 10/31/2019, 9:36 PM

## 2019-10-31 NOTE — Plan of Care (Signed)

## 2019-11-01 LAB — BASIC METABOLIC PANEL
Anion gap: 9 (ref 5–15)
Anion gap: 11 (ref 5–15)
BUN: 50 mg/dL — ABNORMAL HIGH (ref 8–23)
BUN: 51 mg/dL — ABNORMAL HIGH (ref 8–23)
CO2: 26 mmol/L (ref 22–32)
CO2: 25 mmol/L (ref 22–32)
Calcium: 9.8 mg/dL (ref 8.9–10.3)
Calcium: 10.1 mg/dL (ref 8.9–10.3)
Chloride: 107 mmol/L (ref 98–111)
Chloride: 107 mmol/L (ref 98–111)
Creatinine, Ser: 1.96 mg/dL — ABNORMAL HIGH (ref 0.61–1.24)
Creatinine, Ser: 2.02 mg/dL — ABNORMAL HIGH (ref 0.61–1.24)
GFR calc Af Amer: 40 mL/min — ABNORMAL LOW (ref >60)
GFR calc Af Amer: 39 mL/min — ABNORMAL LOW (ref >60)
GFR calc non Af Amer: 35 mL/min — ABNORMAL LOW (ref >60)
GFR calc non Af Amer: 33 mL/min — ABNORMAL LOW (ref >60)
Glucose, Bld: 134 mg/dL — ABNORMAL HIGH (ref 70–99)
Glucose, Bld: 113 mg/dL — ABNORMAL HIGH (ref 70–99)
Potassium: 6.5 mmol/L (ref 3.5–5.1)
Potassium: 5.2 mmol/L — ABNORMAL HIGH (ref 3.5–5.1)
Sodium: 142 mmol/L (ref 135–145)
Sodium: 143 mmol/L (ref 135–145)

## 2019-11-01 LAB — CBC WITH DIFFERENTIAL/PLATELET
Abs Immature Granulocytes: 0.9 10*3/uL — ABNORMAL HIGH (ref 0.00–0.07)
Basophils Absolute: 0.1 10*3/uL (ref 0.0–0.1)
Basophils Relative: 1 %
Eosinophils Absolute: 0 10*3/uL (ref 0.0–0.5)
Eosinophils Relative: 0 %
HCT: 38.2 % — ABNORMAL LOW (ref 39.0–52.0)
Hemoglobin: 12.2 g/dL — ABNORMAL LOW (ref 13.0–17.0)
Immature Granulocytes: 5 %
Lymphocytes Relative: 12 %
Lymphs Abs: 2.4 10*3/uL (ref 0.7–4.0)
MCH: 31.4 pg (ref 26.0–34.0)
MCHC: 31.9 g/dL (ref 30.0–36.0)
MCV: 98.5 fL (ref 80.0–100.0)
Monocytes Absolute: 1.3 10*3/uL — ABNORMAL HIGH (ref 0.1–1.0)
Monocytes Relative: 6 %
Neutro Abs: 15.3 10*3/uL — ABNORMAL HIGH (ref 1.7–7.7)
Neutrophils Relative %: 76 %
Platelets: 231 10*3/uL (ref 150–400)
RBC: 3.88 MIL/uL — ABNORMAL LOW (ref 4.22–5.81)
RDW: 13.4 % (ref 11.5–15.5)
WBC: 20.1 10*3/uL — ABNORMAL HIGH (ref 4.0–10.5)
nRBC: 0.1 % (ref 0.0–0.2)

## 2019-11-01 LAB — MAGNESIUM: Magnesium: 2.2 mg/dL (ref 1.7–2.4)

## 2019-11-01 NOTE — Plan of Care (Signed)

## 2019-11-01 NOTE — Progress Notes (Signed)
PROGRESS NOTE  [name]  DOB: Apr 20, 1951  PCP: [name], [place] YKD:[id number]  DOA: 10/25/2019  LOS: 7 days   Brief narrative: Patient is a 68 year old male, obese, with PMH significant for paranoid schizophrenia, prolonged QT interval, non-Hodgkin's lymphoma, hypertension, hyperlipidemia, COPD, diastolic congestive heart failure and chronic kidney disease.  Patient lives in a group home.   12/9, patient was sent to the ED for worsening shortness of breath, fever chills and low O2 sat at 89%.  In the ED, patient was started on O2 by nasal cannula.   Work-up showed elevated creatinine to 3.09 (up from 1.67).  COVID-19 PCR was positive.   Chest x-ray revealed multifocal pneumonia.  Patient was admitted to hospital service for further evaluation and management.  Subjective: Patient was seen and examined this morning.  [name].  Lying down in bed.  Does not seem interested with the conversation.  Per RN, patient has been very difficult to take care of because of his underlying psychiatric issues.  Assessment/Plan: Pneumonia due to COVID-19 virus -Presented with fever, hypoxia and shortness of breath. -COVID-19 PCR positive -Chest x-ray showed multifocal pneumonia -Inflammatory markers were elevated.  Procalcitonin negative. -Patient was treated with 5-day course of remdesivir, completed on 12/13. -Continue 6 mg/day of Decadron for a 10-day course. -Patient completed a course of IV Rocephin and doxycycline. -Currently on 2 L oxygen by nasal cannula.  Wean down as tolerated. -Continue inhalers, cough suppressants and vitamins.  AKI on CKD stage IV -Presented with elevated creatinine to 3.09 (up from 1.67).  -On daily meloxicam at home, which is currently on hold -Renal ultrasound unremarkable -Unable to continue hydration because of Covid pneumonia.  Creatinine gradually improving, 2.02 today -Repeat blood work in the morning. -Remove Foley.  Voiding  trial  QTC prolongation EKG with prolonged QTC on admission Likely due to psych meds Previous hospitalist spoke to psychiatrist Dr [name], recommneded keeping patient on previous psych medication regimen, as he has been on this for >30 years and was previously very difficult to manage his psych condition. Advised against changing or stopping this regimen to avoid a manic episode Daily EKG. EKG today showed QTC at 463 ms. Monitor electrolytes closely  Chronic diastolic HF Stable. Echo done in 09/2018 showed EF of 65 to 39%, with LV diastolic dysfunction Monitor closely  Hyperlipidemia Continue Lipitor  Hypothyroidism Continue Synthroid  Schizoaffective disorder Continue clozapine, benztropine, Thorazine, Lamictal, Lexapro  Morbid obesity - Body mass index is 54.99 kg/m. Patient has been advised to make an attempt to improve diet and exercise patterns to aid in weight loss.  Mobility: Encourage ambulation Diet: Cardiac diet Fluid: No IV fluid DVT prophylaxis:  Lovenox subcu Code Status:  Full code Family Communication:  Not at bedside Expected Discharge:  Difficult to find SNF because of underlying psychiatric issues.  Case management aware  Consultants:    Procedures:    Antimicrobials: Anti-infectives (From admission, onward)   Start     Dose/Rate Route Frequency Ordered Stop   10/26/19 1000  remdesivir 100 mg in sodium chloride 0.9 % 100 mL IVPB  Status:  Discontinued     100 mg 200 mL/hr over 30 Minutes Intravenous Daily 10/25/19 2229 10/25/19 2254   10/26/19 1000  remdesivir 100 mg in sodium chloride 0.9 % 100 mL IVPB     100 mg 200 mL/hr over 30 Minutes Intravenous Daily 10/25/19 2135 10/29/19 1045   10/25/19 2230  cefTRIAXone (ROCEPHIN) 1 g in sodium chloride 0.9 % 100  mL IVPB  Status:  Discontinued     1 g 200 mL/hr over 30 Minutes Intravenous Every 24 hours 10/25/19 2228 10/30/19 1746   10/25/19 2230  doxycycline (VIBRA-TABS) tablet 100 mg  Status:   Discontinued     100 mg Oral Every 12 hours 10/25/19 2229 10/30/19 1746   10/25/19 2228  remdesivir 200 mg in sodium chloride 0.9% 250 mL IVPB  Status:  Discontinued     200 mg 580 mL/hr over 30 Minutes Intravenous Once 10/25/19 2229 10/25/19 2254   10/25/19 2200  remdesivir 200 mg in sodium chloride 0.9% 250 mL IVPB     200 mg 580 mL/hr over 30 Minutes Intravenous Once 10/25/19 2135 10/26/19 0256        Code Status: Full Code   Diet Order            Diet Heart Room service appropriate? Yes; Fluid consistency: Thin  Diet effective now              Infusions:    Scheduled Meds: . aspirin EC  81 mg Oral Daily  . atorvastatin  20 mg Oral q1800  . benztropine  0.5 mg Oral BID  . Chlorhexidine Gluconate Cloth  6 each Topical Daily  . chlorproMAZINE  100 mg Oral QHS  . chlorproMAZINE  50 mg Oral TID  . cholecalciferol  5,000 Units Oral Daily  . cloZAPine  150 mg Oral BID  . dexamethasone (DECADRON) injection  6 mg Intravenous Q24H  . docusate sodium  100 mg Oral Daily  . enoxaparin (LOVENOX) injection  80 mg Subcutaneous QHS  . escitalopram  10 mg Oral QAC breakfast  . fluticasone  1 puff Inhalation BID  . folic acid  1 mg Oral Daily  . Ipratropium-Albuterol  1 puff Inhalation Q6H  . lamoTRIgine  50 mg Oral TID  . levothyroxine  100 mcg Oral Daily  . metoprolol tartrate  12.5 mg Oral BID  . multivitamin with minerals  1 tablet Oral Daily  . nystatin  1 Bottle Topical Daily  . polyethylene glycol  17 g Oral Daily  . senna  1 tablet Oral Daily  . thiamine  100 mg Oral Daily  . vitamin C  500 mg Oral Daily  . zinc sulfate  220 mg Oral Daily    PRN meds:    Objective: Vitals:   11/01/19 0927 11/01/19 1300  BP:  (!) 110/56  Pulse:  82  Resp:  (!) 22  Temp:  98.2 F (36.8 C)  SpO2: 90% 93%    Intake/Output Summary (Last 24 hours) at 11/01/2019 1759 Last data filed at 11/01/2019 1119 Gross per 24 hour  Intake 60 ml  Output 2425 ml  Net -2365 ml   Filed  Weights   10/26/19 1812  Weight: (!) 183.9 kg   Weight change:  Body mass index is 54.99 kg/m.   Physical Exam: General exam: Appears calm and comfortable.  Skin: No rashes, lesions or ulcers. HEENT: Atraumatic, normocephalic, supple neck, no obvious bleeding Lungs: Clear to auscultation bilaterally CVS: Regular rate and rhythm, no murmur GI/Abd soft, nontender, nondistended, bowel sound present CNS: Alert, awake, not interested in answering questions Psychiatry: Seems anxious and slightly restless Extremities: No pedal edema, no calf tenderness  Data Review: I have personally reviewed the laboratory data and studies available.  Recent Labs  Lab 10/28/19 0256 10/29/19 0834 10/30/19 1100 10/31/19 0338 11/01/19 0332  WBC 10.8* 12.8* 15.2* 15.0* 20.1*  NEUTROABS 8.6* 9.0* 12.7* 11.5*  15.3*  HGB 10.8* 12.5* 11.5* 11.2* 12.2*  HCT 34.1* 38.2* 35.2* 34.9* 38.2*  MCV 96.3 95.0 97.0 96.7 98.5  PLT 206 184 204 212 231   Recent Labs  Lab 10/26/19 1707 10/27/19 0319 10/28/19 0256 10/29/19 0834 10/30/19 1100 10/31/19 0338 10/31/19 1125 11/01/19 0332 11/01/19 0831  NA 144 145 144 141 142 140 142 142 143  K 5.1 5.0 5.3* 5.0 4.7 5.6* 5.0 6.5* 5.2*  CL 109 108 109 108 105 107 106 107 107  CO2 26 23 26 24 25 25 27 26 25   GLUCOSE 138* 146* 151* 124* 183* 179* 169* 134* 113*  Moran 49* 51* 56* 47* 47* 47* 48* 50* 51*  CREATININE 2.63* 2.44* 2.19* 1.91* 2.02* 1.96* 2.14* 1.96* 2.02*  CALCIUM 8.7* 9.5 9.3 9.5 9.7 9.5 9.5 9.8 10.1  MG 2.5* 2.5* 2.3 1.9 1.9  --  2.2 2.2  --   PHOS 3.0 4.1 4.1 3.8 4.3  --   --   --   --     Terrilee Croak, MD  [facility] 11/01/2019

## 2019-11-01 NOTE — Progress Notes (Signed)
Foley catheter removed as per protocol for voiding trial.

## 2019-11-01 NOTE — Progress Notes (Signed)
Patient voided x 2 since removal of foley per NT.  Will continue to monitor.

## 2019-11-01 NOTE — Progress Notes (Signed)
Pt very drowsy this morning....was not alert enough to take his 6 am meds.  Would open eyes when asked but would not keep them open, would tell me to stop when doing a sternal rub, would repeat some on my words/instructions.

## 2019-11-01 NOTE — Progress Notes (Addendum)
Physical Therapy Treatment Patient Details Name: [name] MRN: [id number] DOB: Apr 27, 1951 Today's Date: 11/01/2019    History of Present Illness 68 yo male admitted with COVID (+). Hx of schizoaffective d/o, NHL, CKD, obesity, CHF, COPD. Pt was admitted from a group home.    PT Comments    Pt presents with sitter in room, supine in bed on 1LPM O2. Pt with improved ability to come to seated EOB and STS transfers from EOB and bedside chair with single HHA this session. Pt initially on 1LPM O2 and O2 sat 89-90% while supine in bed, then switched to room air once sitting and O2 sat 91-94% with seated LAQ and ambulation around room. Pt with poorly controlled seated therapeutic exercises, but able to follow commands appropriately. Pt impulsive throughout session, rushing to bedside chair to sit due to L knee pain, attempting to get back to bed on bedside with bedrail elevated, but follows commands for appropriate sequencing and room navigation to improve safety. Pt maintains static crouched gait and flexed trunk with HHA, possibly baseline gait pattern. Pt with L knee buckling x1 requiring min assist and seated rest break to recover. Pt limited by fatigue and poor endurance, but O2 sat 91-94% on room air with mobility. Pt repeatedly reports "I'm sick" and "I've got a condition" during therapy, but agreeable to try with encouragement. Assisted pt back to bed due to fatigue, returned 1LPM O2 at EOS, nurse tech sitter in room throughout treatment session and notified RN of pt's O2 sat with mobility. Pt will benefit from continued physical therapy in hospital and recommended venue below to increase strength, balance, endurance for safe ADLs and gait.   Follow Up Recommendations  SNF     Equipment Recommendations  None recommended by PT    Recommendations for Other Services       Precautions / Restrictions Precautions Precautions: Fall Restrictions Weight Bearing Restrictions: No     Mobility  Bed Mobility Overal bed mobility: Needs Assistance Bed Mobility: Supine to Sit;Sit to Supine     Supine to sit: HOB elevated;Min guard Sit to supine: Supervision   General bed mobility comments: rocking momentum to rise and impulsive movements, elevated HOB and use of bedrail  Transfers Overall transfer level: Needs assistance Equipment used: 1 person hand held assist Transfers: Sit to/from Stand Sit to Stand: Min assist         General transfer comment: min assist to steady upon standing and for controlled descent, cues for safety  Ambulation/Gait Ambulation/Gait assistance: Min assist;+2 physical assistance Gait Distance (Feet): 15 Feet(with seated rest break halfway through) Assistive device: 1 person hand held assist Gait Pattern/deviations: Step-through pattern;Decreased stride length;Trunk flexed Gait velocity: decreased   General Gait Details: slightly crouched gait maintianing flexed trunk and bil knee flexion, L knee buckling but able to recover with min assist from therapist, impulsive with fatigue trying to get to seated surfaces unsafely, O2 sat 91-94% on RA and limited by fatigue   Stairs             Wheelchair Mobility    Modified Rankin (Stroke Patients Only)       Balance Overall balance assessment: Needs assistance Sitting-balance support: Feet supported;Bilateral upper extremity supported Sitting balance-Leahy Scale: Good Sitting balance - Comments: seated EOB   Standing balance support: Single extremity supported Standing balance-Leahy Scale: Poor Standing balance comment: with single HHA           Cognition Arousal/Alertness: Awake/alert Behavior During Therapy: [facility] for tasks  assessed/performed Overall Cognitive Status: History of cognitive impairments - at baseline           Exercises General Exercises - Lower Extremity Long Arc Quad: Strengthening;Seated;Both;10 reps    General Comments        Pertinent  Vitals/Pain Pain Assessment: Faces Faces Pain Scale: Hurts a little bit Pain Location: L knee Pain Descriptors / Indicators: Grimacing Pain Intervention(s): Limited activity within patient's tolerance;Monitored during session    Home Living        Prior Function            PT Goals (current goals can now be found in the care plan section) Acute Rehab PT Goals Patient Stated Goal: less pain/discomfort PT Goal Formulation: With patient Time For Goal Achievement: 11/13/19 Potential to Achieve Goals: Fair Progress towards PT goals: Progressing toward goals    Frequency    Min 3X/week      PT Plan Current plan remains appropriate    Co-evaluation              AM-PAC PT "6 Clicks" Mobility   Outcome Measure  Help needed turning from your back to your side while in a flat bed without using bedrails?: A Little Help needed moving from lying on your back to sitting on the side of a flat bed without using bedrails?: A Little Help needed moving to and from a bed to a chair (including a wheelchair)?: A Little Help needed standing up from a chair using your arms (e.g., wheelchair or bedside chair)?: A Little Help needed to walk in hospital room?: A Lot Help needed climbing 3-5 steps with a railing? : A Lot 6 Click Score: 16    End of Session Equipment Utilized During Treatment: Gait belt Activity Tolerance: Patient tolerated treatment well;Patient limited by fatigue Patient left: in bed;with call bell/phone within reach;with bed alarm set;with nursing/sitter in room Nurse Communication: Mobility status PT Visit Diagnosis: Unsteadiness on feet (R26.81);Difficulty in walking, not elsewhere classified (R26.2);Muscle weakness (generalized) (M62.81)     Time: 1350-1410 PT Time Calculation (min) (ACUTE ONLY): 20 min  Charges:  $Therapeutic Activity: 8-22 mins                     Tori Tajanae Guilbault PT, DPT 11/01/19, 2:47 PM [phone]

## 2019-11-02 LAB — BASIC METABOLIC PANEL
Anion gap: 10 (ref 5–15)
BUN: 53 mg/dL — ABNORMAL HIGH (ref 8–23)
CO2: 25 mmol/L (ref 22–32)
Calcium: 9.7 mg/dL (ref 8.9–10.3)
Chloride: 105 mmol/L (ref 98–111)
Creatinine, Ser: 2.02 mg/dL — ABNORMAL HIGH (ref 0.61–1.24)
GFR calc Af Amer: 39 mL/min — ABNORMAL LOW (ref >60)
GFR calc non Af Amer: 33 mL/min — ABNORMAL LOW (ref >60)
Glucose, Bld: 123 mg/dL — ABNORMAL HIGH (ref 70–99)
Potassium: 5.8 mmol/L — ABNORMAL HIGH (ref 3.5–5.1)
Sodium: 140 mmol/L (ref 135–145)

## 2019-11-02 LAB — CBC WITH DIFFERENTIAL/PLATELET
Abs Immature Granulocytes: 0.87 10*3/uL — ABNORMAL HIGH (ref 0.00–0.07)
Basophils Absolute: 0.1 10*3/uL (ref 0.0–0.1)
Basophils Relative: 1 %
Eosinophils Absolute: 0.1 10*3/uL (ref 0.0–0.5)
Eosinophils Relative: 0 %
HCT: 36.8 % — ABNORMAL LOW (ref 39.0–52.0)
Hemoglobin: 11.6 g/dL — ABNORMAL LOW (ref 13.0–17.0)
Immature Granulocytes: 4 %
Lymphocytes Relative: 11 %
Lymphs Abs: 2.4 10*3/uL (ref 0.7–4.0)
MCH: 30.7 pg (ref 26.0–34.0)
MCHC: 31.5 g/dL (ref 30.0–36.0)
MCV: 97.4 fL (ref 80.0–100.0)
Monocytes Absolute: 1.4 10*3/uL — ABNORMAL HIGH (ref 0.1–1.0)
Monocytes Relative: 6 %
Neutro Abs: 16.5 10*3/uL — ABNORMAL HIGH (ref 1.7–7.7)
Neutrophils Relative %: 78 %
Platelets: 248 10*3/uL (ref 150–400)
RBC: 3.78 MIL/uL — ABNORMAL LOW (ref 4.22–5.81)
RDW: 13.9 % (ref 11.5–15.5)
WBC: 21.4 10*3/uL — ABNORMAL HIGH (ref 4.0–10.5)
nRBC: 0 % (ref 0.0–0.2)

## 2019-11-02 MED ORDER — IPRATROPIUM-ALBUTEROL 20-100 MCG/ACT IN AERS
1.0000 | INHALATION_SPRAY | Freq: Three times a day (TID) | RESPIRATORY_TRACT | Status: DC
  Administered 2019-11-03 – 2019-11-15 (×25): 1 via RESPIRATORY_TRACT
  Filled 2019-11-02: qty 4

## 2019-11-02 MED ORDER — LORAZEPAM 2 MG/ML IJ SOLN
1.0000 mg | Freq: Four times a day (QID) | INTRAMUSCULAR | Status: DC | PRN
  Administered 2019-11-02 – 2019-11-10 (×15): 1 mg via INTRAVENOUS
  Filled 2019-11-02 (×17): qty 1

## 2019-11-02 MED ORDER — ACETAMINOPHEN 325 MG PO TABS
650.0000 mg | ORAL_TABLET | Freq: Four times a day (QID) | ORAL | Status: DC | PRN
  Administered 2019-11-02 – 2019-11-25 (×13): 650 mg via ORAL
  Filled 2019-11-02 (×15): qty 2

## 2019-11-02 MED ORDER — IPRATROPIUM-ALBUTEROL 20-100 MCG/ACT IN AERS
1.0000 | INHALATION_SPRAY | Freq: Four times a day (QID) | RESPIRATORY_TRACT | Status: DC | PRN
  Filled 2019-11-02: qty 4

## 2019-11-02 NOTE — Progress Notes (Addendum)
PROGRESS NOTE  KEY CEN  DOB: [birth year]  PCP: [name], [place] DVV:[id number]  DOA: 10/25/2019  LOS: 8 days   Brief narrative: Patient is a 68 year old male, obese, with PMH significant for paranoid schizophrenia, prolonged QT interval, non-Hodgkin's lymphoma, hypertension, hyperlipidemia, COPD, diastolic congestive heart failure and chronic kidney disease.  Patient lives in a group home.   12/9, patient was sent to the ED for worsening shortness of breath, fever chills and low O2 sat at 89%.  In the ED, patient was started on O2 by nasal cannula.   Work-up showed elevated creatinine to 3.09 (up from 1.67).  COVID-19 PCR was positive.   Chest x-ray revealed multifocal pneumonia.  Patient was admitted to hospital service for further evaluation and management.  Subjective: Patient was seen and examined this morning.  [name].  Lying down in bed.  Does not seem interested with the conversation.   Slightly restless today. muttering some words  Assessment/Plan: Pneumonia due to COVID-19 virus -Presented with fever, hypoxia and shortness of breath. -COVID-19 PCR positive -Chest x-ray showed multifocal pneumonia -Inflammatory markers were elevated.  Procalcitonin negative. -Patient was treated with 5-day course of remdesivir, completed on 12/13. -Continue 6 mg/day of Decadron for a 10-day course. -Patient completed a course of IV Rocephin and doxycycline. -Currently on 2 L oxygen by nasal cannula.  Wean down as tolerated. -Continue inhalers, cough suppressants and vitamins.  AKI on CKD stage IV -Presented with elevated creatinine to 3.09 (up from 1.67).  -Prior to admission, patient was on daily meloxicam at home, which is currently on hold -Renal ultrasound unremarkable -Unable to continue hydration because of Covid pneumonia.  Creatinine gradually improving, 2.02 on last check yesterday.  Repeat blood work Architectural technologist.  Hyperkalemia -For some unknown  reason, patient potassium level has been elevated for last 3 to 4 days.  Yesterday was 6.5 but on recheck it was down to 5.2 without intervention.  Potassium level this morning is elevated at 5.8.  He is not on any potassium supplement or potassium sparing diuretics.  Continue to monitor.  QTC prolongation EKG with prolonged QTC on admission Likely due to psych meds Previous hospitalist spoke to psychiatrist Dr [name], recommneded keeping patient on previous psych medication regimen, as he has been on this for >30 years and was previously very difficult to manage his psych condition. Advised against changing or stopping this regimen to avoid a manic episode Daily EKG. EKG today showed QTC at 463 ms. Monitor electrolytes closely  Chronic diastolic HF Stable. Echo done in 09/2018 showed EF of 65 to 62%, with LV diastolic dysfunction Monitor closely  Hyperlipidemia Continue Lipitor  Hypothyroidism Continue Synthroid  Schizoaffective disorder Continue clozapine, benztropine, Thorazine, Lamictal, Lexapro  Morbid obesity - Body mass index is 54.99 kg/m. Patient has been advised to make an attempt to improve diet and exercise patterns to aid in weight loss.  Mobility: Encourage ambulation Diet: Cardiac diet Fluid: No IV fluid DVT prophylaxis:  Lovenox subcu Code Status:  Full code Family Communication:  Not at bedside Expected Discharge:  Difficult to find SNF because of underlying psychiatric issues.  Case management aware  Consultants:    Procedures:    Antimicrobials: Anti-infectives (From admission, onward)   Start     Dose/Rate Route Frequency Ordered Stop   10/26/19 1000  remdesivir 100 mg in sodium chloride 0.9 % 100 mL IVPB  Status:  Discontinued     100 mg 200 mL/hr over 30 Minutes Intravenous Daily 10/25/19 2229 10/25/19  2254   10/26/19 1000  remdesivir 100 mg in sodium chloride 0.9 % 100 mL IVPB     100 mg 200 mL/hr over 30 Minutes Intravenous Daily  10/25/19 2135 10/29/19 1045   10/25/19 2230  cefTRIAXone (ROCEPHIN) 1 g in sodium chloride 0.9 % 100 mL IVPB  Status:  Discontinued     1 g 200 mL/hr over 30 Minutes Intravenous Every 24 hours 10/25/19 2228 10/30/19 1746   10/25/19 2230  doxycycline (VIBRA-TABS) tablet 100 mg  Status:  Discontinued     100 mg Oral Every 12 hours 10/25/19 2229 10/30/19 1746   10/25/19 2228  remdesivir 200 mg in sodium chloride 0.9% 250 mL IVPB  Status:  Discontinued     200 mg 580 mL/hr over 30 Minutes Intravenous Once 10/25/19 2229 10/25/19 2254   10/25/19 2200  remdesivir 200 mg in sodium chloride 0.9% 250 mL IVPB     200 mg 580 mL/hr over 30 Minutes Intravenous Once 10/25/19 2135 10/26/19 0256        Code Status: Full Code   Diet Order            Diet Heart Room service appropriate? Yes; Fluid consistency: Thin  Diet effective now              Infusions:    Scheduled Meds: . aspirin EC  81 mg Oral Daily  . atorvastatin  20 mg Oral q1800  . benztropine  0.5 mg Oral BID  . Chlorhexidine Gluconate Cloth  6 each Topical Daily  . chlorproMAZINE  100 mg Oral QHS  . chlorproMAZINE  50 mg Oral TID  . cholecalciferol  5,000 Units Oral Daily  . cloZAPine  150 mg Oral BID  . dexamethasone (DECADRON) injection  6 mg Intravenous Q24H  . docusate sodium  100 mg Oral Daily  . enoxaparin (LOVENOX) injection  80 mg Subcutaneous QHS  . escitalopram  10 mg Oral QAC breakfast  . fluticasone  1 puff Inhalation BID  . folic acid  1 mg Oral Daily  . Ipratropium-Albuterol  1 puff Inhalation Q6H  . lamoTRIgine  50 mg Oral TID  . levothyroxine  100 mcg Oral Daily  . metoprolol tartrate  12.5 mg Oral BID  . multivitamin with minerals  1 tablet Oral Daily  . nystatin  1 Bottle Topical Daily  . polyethylene glycol  17 g Oral Daily  . senna  1 tablet Oral Daily  . thiamine  100 mg Oral Daily  . vitamin C  500 mg Oral Daily  . zinc sulfate  220 mg Oral Daily    PRN meds:    Objective: Vitals:    11/02/19 1036 11/02/19 1314  BP:  (!) 142/127  Pulse: 72 79  Resp: 16 (!) 22  Temp:  98 F (36.7 C)  SpO2: 92% 94%    Intake/Output Summary (Last 24 hours) at 11/02/2019 1522 Last data filed at 11/02/2019 1244 Gross per 24 hour  Intake 480 ml  Output -  Net 480 ml   Filed Weights   10/26/19 1812  Weight: (!) 183.9 kg   Weight change:  Body mass index is 54.99 kg/m.   Physical Exam: General exam: Not in physical distress.  Slightly restless today. Skin: No rashes, lesions or ulcers. HEENT: Atraumatic, normocephalic, supple neck, no obvious bleeding Lungs: Clear to auscultation bilaterally CVS: Regular rate and rhythm, no murmur GI/Abd soft, nontender, nondistended, bowel sound present CNS: Alert, awake, not interested in answering questions.  Slightly restless  Psychiatry: Seems anxious and slightly restless Extremities: No pedal edema, no calf tenderness  Data Review: I have personally reviewed the laboratory data and studies available.  Recent Labs  Lab 10/29/19 0834 10/30/19 1100 10/31/19 0338 11/01/19 0332 11/02/19 0303  WBC 12.8* 15.2* 15.0* 20.1* 21.4*  NEUTROABS 9.0* 12.7* 11.5* 15.3* 16.5*  HGB 12.5* 11.5* 11.2* 12.2* 11.6*  HCT 38.2* 35.2* 34.9* 38.2* 36.8*  MCV 95.0 97.0 96.7 98.5 97.4  PLT 184 204 212 231 248   Recent Labs  Lab 10/26/19 1707 10/27/19 0319 10/28/19 0256 10/29/19 0834 10/30/19 1100 10/31/19 0338 10/31/19 1125 11/01/19 0332 11/01/19 0831 11/02/19 0303  NA 144 145 144 141 142 140 142 142 143 140   K 5.1 5.0 5.3* 5.0 4.7 5.6* 5.0 6.5* 5.2* 5.8*  CL 109 108 109 108 105 107 106 107 107 105   CO2 26 23 26 24 25 25 27 26 25  25  GLUCOSE 138* 146* 151* 124* 183* 179* 169* 134* 113* 123*  BUN 49* 51* 56* 47* 47* 47* 48* 50* 51* 53*  CREATININE 2.63* 2.44* 2.19* 1.91* 2.02* 1.96* 2.14* 1.96* 2.02* 2.02*  CALCIUM 8.7* 9.5 9.3 9.5 9.7 9.5 9.5 9.8 10.1 9.7  MG 2.5* 2.5* 2.3 1.9 1.9  --  2.2 2.2  --   --   PHOS 3.0 4.1 4.1 3.8 4.3  --    --   --   --   --     Terrilee Croak, MD  [facility] 11/02/2019

## 2019-11-02 NOTE — Progress Notes (Addendum)
   Abnormal Lab Value:  Potassium 5.8  Date & Time Notied:  11/02/2019-0438  Provider Notified: Bodenhiemer PA  Orders Received/Actions taken: no new orders at this time

## 2019-11-02 NOTE — Plan of Care (Signed)
  Problem: Education: Goal: Knowledge of General Education information will improve Description: Including pain rating scale, medication(s)/side effects and non-pharmacologic comfort measures Outcome: Not Progressing   Problem: Clinical Measurements: Goal: Ability to maintain clinical measurements within normal limits will improve Outcome: Progressing Goal: Will remain free from infection Outcome: Progressing   Problem: Clinical Measurements: Goal: Will remain free from infection Outcome: Progressing

## 2019-11-02 NOTE — Care Management Important Message (Signed)
Important Message  Patient Details IM Letter given to [place] Case Manager to present to the Patient Name: [name] MRN: [id number] Date of Birth: 1950-12-13   Medicare Important Message Given:  Yes     [name] 11/02/2019, 10:41 AM

## 2019-11-02 NOTE — Progress Notes (Signed)
[place] [phone] to update and review plan of care. SRP, RN

## 2019-11-03 LAB — CBC WITH DIFFERENTIAL/PLATELET
Abs Immature Granulocytes: 0.8 10*3/uL — ABNORMAL HIGH (ref 0.00–0.07)
Basophils Absolute: 0.1 10*3/uL (ref 0.0–0.1)
Basophils Relative: 0 %
Eosinophils Absolute: 0 10*3/uL (ref 0.0–0.5)
Eosinophils Relative: 0 %
HCT: 37.7 % — ABNORMAL LOW (ref 39.0–52.0)
Hemoglobin: 11.9 g/dL — ABNORMAL LOW (ref 13.0–17.0)
Immature Granulocytes: 4 %
Lymphocytes Relative: 9 %
Lymphs Abs: 1.5 10*3/uL (ref 0.7–4.0)
MCH: 31.1 pg (ref 26.0–34.0)
MCHC: 31.6 g/dL (ref 30.0–36.0)
MCV: 98.4 fL (ref 80.0–100.0)
Monocytes Absolute: 0.8 10*3/uL (ref 0.1–1.0)
Monocytes Relative: 4 %
Neutro Abs: 14.8 10*3/uL — ABNORMAL HIGH (ref 1.7–7.7)
Neutrophils Relative %: 83 %
Platelets: 238 10*3/uL (ref 150–400)
RBC: 3.83 MIL/uL — ABNORMAL LOW (ref 4.22–5.81)
RDW: 14.3 % (ref 11.5–15.5)
WBC: 18 10*3/uL — ABNORMAL HIGH (ref 4.0–10.5)
nRBC: 0 % (ref 0.0–0.2)

## 2019-11-03 LAB — BASIC METABOLIC PANEL
Anion gap: 11 (ref 5–15)
BUN: 54 mg/dL — ABNORMAL HIGH (ref 8–23)
CO2: 24 mmol/L (ref 22–32)
Calcium: 9.2 mg/dL (ref 8.9–10.3)
Chloride: 101 mmol/L (ref 98–111)
Creatinine, Ser: 2.03 mg/dL — ABNORMAL HIGH (ref 0.61–1.24)
GFR calc Af Amer: 38 mL/min — ABNORMAL LOW (ref >60)
GFR calc non Af Amer: 33 mL/min — ABNORMAL LOW (ref >60)
Glucose, Bld: 189 mg/dL — ABNORMAL HIGH (ref 70–99)
Potassium: 5.2 mmol/L — ABNORMAL HIGH (ref 3.5–5.1)
Sodium: 136 mmol/L (ref 135–145)

## 2019-11-03 NOTE — Progress Notes (Signed)
PROGRESS NOTE  [name]  DOB: [birth year]  PCP: [name], [place] XAJ:[id number]  DOA: 10/25/2019  LOS: 9 days   Brief narrative: Patient is a 68 year old male, obese, with PMH significant for paranoid schizophrenia, prolonged QT interval, non-Hodgkin's lymphoma, hypertension, hyperlipidemia, COPD, diastolic congestive heart failure and chronic kidney disease.  Patient lives in a group home.   12/9, patient was sent to the ED for worsening shortness of breath, fever chills and low O2 sat at 89%.  In the ED, patient was started on O2 by nasal cannula.   Work-up showed elevated creatinine to 3.09 (up from 1.67).  COVID-19 PCR was positive.   Chest x-ray revealed multifocal pneumonia.  Patient was admitted to hospital service for further evaluation and management.  Subjective: Patient was seen and examined this morning.  [name].  Lying down in bed.  Does not seem interested with the conversation.   Slightly restless today. muttering some words  Assessment/Plan: Pneumonia due to COVID-19 virus -Presented with fever, hypoxia and shortness of breath. -COVID-19 PCR positive -Chest x-ray showed multifocal pneumonia -Inflammatory markers were elevated.  Procalcitonin negative. -Patient was treated with 5-day course of remdesivir, completed on 12/13. -Continue 6 mg/day of Decadron for a 10-day course.  Last dose tomorrow. -Patient completed a course of IV Rocephin and doxycycline. -Currently on 2 L oxygen by nasal cannula.  Wean down as tolerated. -Continue inhalers, cough suppressants and vitamins.  AKI on CKD stage IV -Presented with elevated creatinine to 3.09 (up from 1.67).  -Prior to admission, patient was on daily meloxicam at home, which is currently on hold -Renal ultrasound unremarkable -Unable to continue hydration because of Covid pneumonia.  Creatinine gradually improving, 2.02 on last check yesterday.  Repeat blood work  Architectural technologist.  Hyperkalemia -For some unknown reason, patient potassium level has been elevated for last 3 to 4 days.  Peaked at 6.5.  Fluctuating level.  Potassium level this morning is elevated at 5.2.  He is not on any potassium supplement or potassium sparing diuretics.  Continue to monitor.  QTC prolongation EKG with prolonged QTC on admission Likely due to psych meds Previous hospitalist spoke to psychiatrist Dr [name], recommneded keeping patient on previous psych medication regimen, as he has been on this for >30 years and was previously very difficult to manage his psych condition. Advised against changing or stopping this regimen to avoid a manic episode Daily EKG. EKG 12/17 showed QTC at 463 ms. Monitor electrolytes closely  Chronic diastolic HF Stable. Echo done in 09/2018 showed EF of 65 to 09%, with LV diastolic dysfunction Monitor closely  Hyperlipidemia Continue Lipitor  Hypothyroidism Continue Synthroid  Intermittent hallucinations/agitation schizoaffective disorder Continue clozapine, benztropine, Thorazine, Lamictal, Lexapro Patient is getting intermittent IV Ativan as needed for agitation/hallucinations.  Morbid obesity - Body mass index is 54.99 kg/m. Patient has been advised to make an attempt to improve diet and exercise patterns to aid in weight loss.  Mobility: Encourage ambulation Diet: Cardiac diet Fluid: No IV fluid DVT prophylaxis:  Lovenox subcu Code Status:  Full code Family Communication:  Not at bedside Expected Discharge:  Difficult to find SNF because of underlying psychiatric issues.  Case management aware  Consultants:    Procedures:    Antimicrobials: Anti-infectives (From admission, onward)   Start     Dose/Rate Route Frequency Ordered Stop   10/26/19 1000  remdesivir 100 mg in sodium chloride 0.9 % 100 mL IVPB  Status:  Discontinued     100 mg 200  mL/hr over 30 Minutes Intravenous Daily 10/25/19 2229 10/25/19 2254    10/26/19 1000  remdesivir 100 mg in sodium chloride 0.9 % 100 mL IVPB     100 mg 200 mL/hr over 30 Minutes Intravenous Daily 10/25/19 2135 10/29/19 1045   10/25/19 2230  cefTRIAXone (ROCEPHIN) 1 g in sodium chloride 0.9 % 100 mL IVPB  Status:  Discontinued     1 g 200 mL/hr over 30 Minutes Intravenous Every 24 hours 10/25/19 2228 10/30/19 1746   10/25/19 2230  doxycycline (VIBRA-TABS) tablet 100 mg  Status:  Discontinued     100 mg Oral Every 12 hours 10/25/19 2229 10/30/19 1746   10/25/19 2228  remdesivir 200 mg in sodium chloride 0.9% 250 mL IVPB  Status:  Discontinued     200 mg 580 mL/hr over 30 Minutes Intravenous Once 10/25/19 2229 10/25/19 2254   10/25/19 2200  remdesivir 200 mg in sodium chloride 0.9% 250 mL IVPB     200 mg 580 mL/hr over 30 Minutes Intravenous Once 10/25/19 2135 10/26/19 0256        Code Status: Full Code   Diet Order            Diet Heart Room service appropriate? Yes; Fluid consistency: Thin  Diet effective now              Infusions:    Scheduled Meds: . aspirin EC  81 mg Oral Daily  . atorvastatin  20 mg Oral q1800  . benztropine  0.5 mg Oral BID  . Chlorhexidine Gluconate Cloth  6 each Topical Daily  . chlorproMAZINE  100 mg Oral QHS  . chlorproMAZINE  50 mg Oral TID  . cholecalciferol  5,000 Units Oral Daily  . cloZAPine  150 mg Oral BID  . docusate sodium  100 mg Oral Daily  . enoxaparin (LOVENOX) injection  80 mg Subcutaneous QHS  . escitalopram  10 mg Oral QAC breakfast  . fluticasone  1 puff Inhalation BID  . folic acid  1 mg Oral Daily  . Ipratropium-Albuterol  1 puff Inhalation TID  . lamoTRIgine  50 mg Oral TID  . levothyroxine  100 mcg Oral Daily  . metoprolol tartrate  12.5 mg Oral BID  . multivitamin with minerals  1 tablet Oral Daily  . nystatin  1 Bottle Topical Daily  . polyethylene glycol  17 g Oral Daily  . senna  1 tablet Oral Daily  . thiamine  100 mg Oral Daily  . vitamin C  500 mg Oral Daily  . zinc sulfate  220  mg Oral Daily    PRN meds:    Objective: Vitals:   11/03/19 0547 11/03/19 1250  BP: 127/66 (!) 114/58  Pulse: 66 75  Resp: 16 16  Temp: 97.7 F (36.5 C) 97.6 F (36.4 C)  SpO2: 98% 100%    Intake/Output Summary (Last 24 hours) at 11/03/2019 1553 Last data filed at 11/03/2019 1543 Gross per 24 hour  Intake 1260 ml  Output 2025 ml  Net -765 ml   Filed Weights   10/26/19 1812  Weight: (!) 183.9 kg   Weight change:  Body mass index is 54.99 kg/m.   Physical Exam: General exam: Not in physical distress.  Skin: No rashes, lesions or ulcers. HEENT: Atraumatic, normocephalic, supple neck, no obvious bleeding Lungs: Clear to auscultation bilaterally CVS: Regular rate and rhythm, no murmur GI/Abd soft, nontender, nondistended, bowel sound present CNS: Alert, awake, not interested in answering questions.  Slightly restless Psychiatry:  Seems anxious and slightly restless Extremities: No pedal edema, no calf tenderness  Data Review: I have personally reviewed the laboratory data and studies available.  Recent Labs  Lab 10/30/19 1100 10/31/19 0338 11/01/19 0332 11/02/19 0303 11/03/19 0246  WBC 15.2* 15.0* 20.1* 21.4* 18.0*  NEUTROABS 12.7* 11.5* 15.3* 16.5* 14.8*  HGB 11.5* 11.2* 12.2* 11.6* 11.9*  HCT 35.2* 34.9* 38.2* 36.8* 37.7*  MCV 97.0 96.7 98.5 97.4 98.4  PLT 204 212 231 248 238   Recent Labs  Lab 10/28/19 0256 10/29/19 0834 10/30/19 1100 10/31/19 1125 11/01/19 0332 11/01/19 0831 11/02/19 0303 11/03/19 0246  NA 144 141 142 142 142 143 140 136  K 5.3* 5.0 4.7 5.0 6.5* 5.2* 5.8* 5.2*  CL 109 108 105 106 107 107 105 101  CO2 26 24 25 27 26 25 25 24   GLUCOSE 151* 124* 183* 169* 134* 113* 123* 189*  BUN 56* 47* 47* 48* 50* 51* 53* 54*  CREATININE 2.19* 1.91* 2.02* 2.14* 1.96* 2.02* 2.02* 2.03*  CALCIUM 9.3 9.5 9.7 9.5 9.8 10.1 9.7 9.2  MG 2.3 1.9 1.9 2.2 2.2  --   --   --   PHOS 4.1 3.8 4.3  --   --   --   --   --     Terrilee Croak, MD  [facility] 11/03/2019

## 2019-11-03 NOTE — Progress Notes (Signed)
Attempted to called Ms. [name], Legal Guardian [phone] to update and review plan of care. Will attempt again later. SRP, RN

## 2019-11-03 NOTE — Progress Notes (Signed)
Occupational Therapy Treatment Patient Details Name: [name] MRN: [id number] DOB: 1951/01/18 Today's Date: 11/03/2019    History of present illness 68 yo male admitted with COVID (+). Hx of schizoaffective d/o, NHL, CKD, obesity, CHF, COPD. Pt was admitted from a group home.   OT comments  This 68 yo male seen today to work on grooming and tranfers. His sitting balance was not the best at the beginning (but patient really wanted to lay back down), sitting balance progressed to minguard A. Pt able to wash his face and maintain sitting balance at EOB. Pt stood with min A and able to take 4 steps up towards [name] before sitting down. Pt returned himself to supine without A.  Follow Up Recommendations  SNF;Supervision/Assistance - 24 hour    Equipment Recommendations  None recommended by OT       Precautions / Restrictions Precautions Precautions: Fall Restrictions Weight Bearing Restrictions: No       Mobility Bed Mobility Overal bed mobility: Needs Assistance Bed Mobility: Supine to Sit;Sit to Supine     Supine to sit: Min assist Sit to supine: Min guard      Transfers Overall transfer level: Needs assistance Equipment used: Rolling walker (2 wheeled) Transfers: Sit to/from Stand Sit to Stand: Min assist         General transfer comment: Pt using momentum from EOB regular height    Balance Overall balance assessment: Needs assistance Sitting-balance support: Single extremity supported;Feet supported Sitting balance-Leahy Scale: Fair     Standing balance support: Bilateral upper extremity supported Standing balance-Leahy Scale: Poor Standing balance comment: RW and additional A from therapist                           ADL either performed or assessed with clinical judgement   ADL Overall ADL's : Needs assistance/impaired     Grooming: Wash/dry face;Sitting Grooming Details (indicate cue type and reason): at EOB, minguard A due to patient  kept wanting to lay back down                 Toilet Transfer: Minimal Insurance claims handler Details (indicate cue type and reason): Sit>stand with momentum from EOB,then side step 4 steps to [name]- Clothing Manipulation and Hygiene: Maximal assistance Toileting - Clothing Manipulation Details (indicate cue type and reason): min A for standing balance at EOB             Vision Patient Visual Report: No change from baseline            Cognition Arousal/Alertness: Lethargic(woke him up from a nap) Behavior During Therapy: WFL for tasks assessed/performed Overall Cognitive Status: History of cognitive impairments - at baseline                                                     Pertinent Vitals/ Pain       Pain Assessment: No/denies pain         Frequency  Min 2X/week        Progress Toward Goals  OT Goals(current goals can now be found in the care plan section)  Progress towards OT goals: Progressing toward goals     Plan Discharge plan remains appropriate       AM-PAC OT "6 Clicks" Daily Activity  Outcome Measure   Help from another person eating meals?: None Help from another person taking care of personal grooming?: A Lot Help from another person toileting, which includes using toliet, bedpan, or urinal?: A Lot Help from another person bathing (including washing, rinsing, drying)?: A Lot Help from another person to put on and taking off regular upper body clothing?: A Lot Help from another person to put on and taking off regular lower body clothing?: A Lot 6 Click Score: 14    End of Session Equipment Utilized During Treatment: Gait belt  OT Visit Diagnosis: Unsteadiness on feet (R26.81);Muscle weakness (generalized) (M62.81);Other abnormalities of gait and mobility (R26.89);Other symptoms and signs involving cognitive function   Activity Tolerance (patient limited by wanting to lay back down and wanting  something to eat)   Patient Left in bed;with bed alarm set(sitter in room)   Nurse Communication Mobility status(sitter in room)        Time: 2229-7989 OT Time Calculation (min): 19 min  Charges: OT General Charges $OT Visit: 1 Visit OT Treatments $Self Care/Home Management : 8-22 mins  [name], OTR/L Acute [employer] Pager [phone] Office [phone]      [name] 11/03/2019, 5:20 PM

## 2019-11-03 NOTE — Progress Notes (Signed)
Physical Therapy Treatment Patient Details Name: [name] MRN: [id number] DOB: [birth year] Today's Date: 11/03/2019    History of Present Illness 68 yo male admitted with COVID (+). Hx of schizoaffective d/o, NHL, CKD, obesity, CHF, COPD. Pt was admitted from a group home.    PT Comments    Pt with poor motivation this date, refusing to perform seated therapeutic exercises or ambulate around the room with verbal cues or encouragement. Pt impulsive at bedside, performed ~10 STS reps with abrupt return to sitting at EOB until therapist stopped pt. Pt requires min assist with second person in room for safety for transfers, continues to use crouched gait with flexed knees in trunk when upright standing and pivoting bed<>BSC. Pt on 1.5LPM O2 and O2 sat 97% with all activity and supine in bed. Pt repeatedly mumbles "I'm sick" throughout treatment session. Pt appears mildly SOB with mobility. Significant education on safety with transfers and slowing down speed to decrease impulsive behavior and reduce risk for falls, but unsure if good carryover. Patient will benefit from continued physical therapy in hospital and recommended venue below to increase strength, balance, endurance for safe ADLs and gait.   Follow Up Recommendations  SNF     Equipment Recommendations  None recommended by PT    Recommendations for Other Services       Precautions / Restrictions Precautions Precautions: Fall Restrictions Weight Bearing Restrictions: No    Mobility  Bed Mobility Overal bed mobility: Needs Assistance Bed Mobility: Supine to Sit;Sit to Supine     Supine to sit: Min guard Sit to supine: Supervision   General bed mobility comments: rocks to rise to EOB, use of bedrail  Transfers Overall transfer level: Needs assistance Equipment used: 1 person hand held assist Transfers: Sit to/from [facility] Sit to Stand: Min assist;+2 safety/equipment Stand pivot transfers: Min  assist;+2 safety/equipment       General transfer comment: very impulsive, pt performs multiple STS reps from EOB with HHA then abruptly returns to sitting at EOB, educated pt to remain standing once up and slow down for safety  Ambulation/Gait      General Gait Details: pt refused   Stairs             Wheelchair Mobility    Modified Rankin (Stroke Patients Only)       Balance                                            Cognition Arousal/Alertness: Awake/alert Behavior During Therapy: WFL for tasks assessed/performed Overall Cognitive Status: History of cognitive impairments - at baseline                                 General Comments: Mumbles "I'm sick" and lack of eye contact throughout treatment      Exercises      General Comments        Pertinent Vitals/Pain Pain Assessment: Faces Faces Pain Scale: No hurt    Home Living                      Prior Function            PT Goals (current goals can now be found in the care plan section) Acute Rehab PT Goals Patient Stated Goal:  less pain/discomfort PT Goal Formulation: With patient Time For Goal Achievement: 11/13/19 Potential to Achieve Goals: Fair Progress towards PT goals: Progressing toward goals    Frequency    Min 3X/week      PT Plan Current plan remains appropriate    Co-evaluation              AM-PAC PT "6 Clicks" Mobility   Outcome Measure  Help needed turning from your back to your side while in a flat bed without using bedrails?: A Little Help needed moving from lying on your back to sitting on the side of a flat bed without using bedrails?: A Little Help needed moving to and from a bed to a chair (including a wheelchair)?: A Little Help needed standing up from a chair using your arms (e.g., wheelchair or bedside chair)?: A Little Help needed to walk in hospital room?: A Lot Help needed climbing 3-5 steps with a railing? :  A Lot 6 Click Score: 16    End of Session Equipment Utilized During Treatment: Oxygen(1.5LPM O2) Activity Tolerance: Patient tolerated treatment well;Patient limited by fatigue;Other (comment)(limited by lack of motivation) Patient left: in bed;with call bell/phone within reach;with bed alarm set;with nursing/sitter in room Nurse Communication: Mobility status PT Visit Diagnosis: Unsteadiness on feet (R26.81);Difficulty in walking, not elsewhere classified (R26.2);Muscle weakness (generalized) (M62.81)     Time: 2542-7062 PT Time Calculation (min) (ACUTE ONLY): 16 min  Charges:  $Therapeutic Activity: 8-22 mins                      Tori Jaxxson Cavanah PT, DPT 11/03/19, 12:21 PM [phone]

## 2019-11-03 NOTE — Plan of Care (Signed)
  Problem: Education: Goal: Knowledge of General Education information will improve Description Including pain rating scale, medication(s)/side effects and non-pharmacologic comfort measures Outcome: Progressing   

## 2019-11-03 NOTE — TOC Progression Note (Signed)
Transition of Care [facility]) - Progression Note    Patient Details  Name: [name] MRN: [id number] Date of Birth: 1951-06-21  Transition of [facility]) CM/SW Contact  Purcell Mouton, RN Phone Number: 11/03/2019, 3:46 PM  Clinical Narrative:    Damaris Schooner with [facility] Legal guardian concerning SNF. She continue to ask for pt to be put on [facility]. The group home was not able to care for pt properly. [facility] states that the group home have only one non-medical person to care for fourteen clients. [facility] continues with it is not a safe place to go back too.          Expected Discharge Plan and Services                                                 Social Determinants of Health (SDOH) Interventions    Readmission Risk Interventions Readmission Risk Prevention Plan 07/20/2018  Transportation Screening Complete  PCP or Specialist Appt within 3-5 Days Complete  Home Care Screening Complete  HRI or Home Care Consult Complete  Social Work Consult for [place] Planning/Counseling Complete  Palliative Care Screening Complete  Medication Review [employer]) Complete  Some recent data might be hidden

## 2019-11-04 LAB — CBC WITH DIFFERENTIAL/PLATELET
Abs Immature Granulocytes: 0.8 10*3/uL — ABNORMAL HIGH (ref 0.00–0.07)
Basophils Absolute: 0.1 10*3/uL (ref 0.0–0.1)
Basophils Relative: 0 %
Eosinophils Absolute: 0 10*3/uL (ref 0.0–0.5)
Eosinophils Relative: 0 %
HCT: 36.1 % — ABNORMAL LOW (ref 39.0–52.0)
Hemoglobin: 11.8 g/dL — ABNORMAL LOW (ref 13.0–17.0)
Immature Granulocytes: 4 %
Lymphocytes Relative: 10 %
Lymphs Abs: 1.9 10*3/uL (ref 0.7–4.0)
MCH: 31.5 pg (ref 26.0–34.0)
MCHC: 32.7 g/dL (ref 30.0–36.0)
MCV: 96.3 fL (ref 80.0–100.0)
Monocytes Absolute: 1.4 10*3/uL — ABNORMAL HIGH (ref 0.1–1.0)
Monocytes Relative: 7 %
Neutro Abs: 14.4 10*3/uL — ABNORMAL HIGH (ref 1.7–7.7)
Neutrophils Relative %: 79 %
Platelets: 221 10*3/uL (ref 150–400)
RBC: 3.75 MIL/uL — ABNORMAL LOW (ref 4.22–5.81)
RDW: 14.2 % (ref 11.5–15.5)
WBC: 18.6 10*3/uL — ABNORMAL HIGH (ref 4.0–10.5)
nRBC: 0 % (ref 0.0–0.2)

## 2019-11-04 LAB — BASIC METABOLIC PANEL
Anion gap: 11 (ref 5–15)
BUN: 58 mg/dL — ABNORMAL HIGH (ref 8–23)
CO2: 27 mmol/L (ref 22–32)
Calcium: 9.8 mg/dL (ref 8.9–10.3)
Chloride: 102 mmol/L (ref 98–111)
Creatinine, Ser: 1.97 mg/dL — ABNORMAL HIGH (ref 0.61–1.24)
GFR calc Af Amer: 40 mL/min — ABNORMAL LOW (ref >60)
GFR calc non Af Amer: 34 mL/min — ABNORMAL LOW (ref >60)
Glucose, Bld: 146 mg/dL — ABNORMAL HIGH (ref 70–99)
Potassium: 4.9 mmol/L (ref 3.5–5.1)
Sodium: 140 mmol/L (ref 135–145)

## 2019-11-04 NOTE — Progress Notes (Signed)
PROGRESS NOTE  [name]  DOB: 1951/06/30  PCP: [name], [place] PIR:[id number]  DOA: 10/25/2019  LOS: 10 days   Brief narrative: Patient is a 68 year old male, obese, with PMH significant for paranoid schizophrenia, prolonged QT interval, non-Hodgkin's lymphoma, hypertension, hyperlipidemia, COPD, diastolic congestive heart failure and chronic kidney disease.  Patient lives in a group home.   12/9, patient was sent to the ED for worsening shortness of breath, fever chills and low O2 sat at 89%.  In the ED, patient was started on O2 by nasal cannula.   Work-up showed elevated creatinine to 3.09 (up from 1.67).  COVID-19 PCR was positive.   Chest x-ray revealed multifocal pneumonia.  Patient was admitted to hospital service for further evaluation and management.  Subjective: Patient was seen and examined this morning.  [name].   Alert, awake, does not make eye contact.  Denies any pain, shortness of breath, discomfort.  Being fed by RN.    Assessment/Plan: Pneumonia due to COVID-19 virus -Presented with fever, hypoxia and shortness of breath. -COVID-19 PCR positive -Chest x-ray showed multifocal pneumonia -Inflammatory markers were elevated.  Procalcitonin negative. -Patient was treated with 5-day course of remdesivir, completed on 12/13 and 10-day course of Decadron completed 12/18. -Patient completed a course of IV Rocephin and doxycycline. -Currently on 2 L oxygen by nasal cannula.  Wean down as tolerated. -Continue inhalers, cough suppressants and vitamins.  AKI on CKD stage IV -Presented with elevated creatinine to 3.09 (up from 1.67).  -Prior to admission, patient was on daily meloxicam at home, which is currently on hold -Renal ultrasound unremarkable -Unable to continue hydration because of Covid pneumonia.  Creatinine gradually improving, 2.03 on blood work today.  Acute urinary retention -Patient had a Foley catheter placed on admission  for urine retention.   -12/16, catheter was removed patient passed voiding trial but he had retention again the next day.  -12/17, a coud catheter was inserted.  Needs voiding trial tried again in the next 1 to 2 weeks.  Hyperkalemia -For some unknown reason, patient potassium level has been elevated for last 3 to 4 days.  Peaked at 6.5.  Fluctuating level.  Potassium level normal today.  He is not on any potassium supplement or potassium sparing diuretics.  Continue to monitor.  QTC prolongation EKG with prolonged QTC on admission Likely due to psych meds Previous hospitalist spoke to psychiatrist Dr [name], recommneded keeping patient on previous psych medication regimen, as he has been on this for >30 years and was previously very difficult to manage his psych condition. Advised against changing or stopping this regimen to avoid a manic episode Daily EKG. EKG 12/17 showed QTC at 463 ms. Monitor electrolytes closely  Chronic diastolic HF Stable. Echo done in 09/2018 showed EF of 65 to 63%, with LV diastolic dysfunction Monitor closely  Hyperlipidemia Continue Lipitor  Hypothyroidism Continue Synthroid  Intermittent hallucinations/agitation schizoaffective disorder Continue clozapine, benztropine, Thorazine, Lamictal, Lexapro Patient is getting intermittent IV Ativan as needed for agitation/hallucinations. Psychiatry consultation obtained on 12/12  Morbid obesity - Body mass index is 54.99 kg/m. Patient has been advised to make an attempt to improve diet and exercise patterns to aid in weight loss.  Mobility: Encourage ambulation Diet: Cardiac diet Fluid: No IV fluid DVT prophylaxis:  Lovenox subcu Code Status:  Full code Family Communication:  Not at bedside Expected Discharge:  Difficult to find SNF because of underlying psychiatric issues.  Case management aware  Consultants:    Procedures:  Antimicrobials: Anti-infectives (From admission, onward)    Start     Dose/Rate Route Frequency Ordered Stop   10/26/19 1000  remdesivir 100 mg in sodium chloride 0.9 % 100 mL IVPB  Status:  Discontinued     100 mg 200 mL/hr over 30 Minutes Intravenous Daily 10/25/19 2229 10/25/19 2254   10/26/19 1000  remdesivir 100 mg in sodium chloride 0.9 % 100 mL IVPB     100 mg 200 mL/hr over 30 Minutes Intravenous Daily 10/25/19 2135 10/29/19 1045   10/25/19 2230  cefTRIAXone (ROCEPHIN) 1 g in sodium chloride 0.9 % 100 mL IVPB  Status:  Discontinued     1 g 200 mL/hr over 30 Minutes Intravenous Every 24 hours 10/25/19 2228 10/30/19 1746   10/25/19 2230  doxycycline (VIBRA-TABS) tablet 100 mg  Status:  Discontinued     100 mg Oral Every 12 hours 10/25/19 2229 10/30/19 1746   10/25/19 2228  remdesivir 200 mg in sodium chloride 0.9% 250 mL IVPB  Status:  Discontinued     200 mg 580 mL/hr over 30 Minutes Intravenous Once 10/25/19 2229 10/25/19 2254   10/25/19 2200  remdesivir 200 mg in sodium chloride 0.9% 250 mL IVPB     200 mg 580 mL/hr over 30 Minutes Intravenous Once 10/25/19 2135 10/26/19 0256        Code Status: Full Code   Diet Order            Diet Heart Room service appropriate? Yes; Fluid consistency: Thin  Diet effective now              Infusions:    Scheduled Meds: . aspirin EC  81 mg Oral Daily  . atorvastatin  20 mg Oral q1800  . benztropine  0.5 mg Oral BID  . Chlorhexidine Gluconate Cloth  6 each Topical Daily  . chlorproMAZINE  100 mg Oral QHS  . chlorproMAZINE  50 mg Oral TID  . cholecalciferol  5,000 Units Oral Daily  . cloZAPine  150 mg Oral BID  . docusate sodium  100 mg Oral Daily  . enoxaparin (LOVENOX) injection  80 mg Subcutaneous QHS  . escitalopram  10 mg Oral QAC breakfast  . fluticasone  1 puff Inhalation BID  . folic acid  1 mg Oral Daily  . Ipratropium-Albuterol  1 puff Inhalation TID  . lamoTRIgine  50 mg Oral TID  . levothyroxine  100 mcg Oral Daily  . metoprolol tartrate  12.5 mg Oral BID  .  multivitamin with minerals  1 tablet Oral Daily  . nystatin  1 Bottle Topical Daily  . polyethylene glycol  17 g Oral Daily  . senna  1 tablet Oral Daily  . thiamine  100 mg Oral Daily  . vitamin C  500 mg Oral Daily  . zinc sulfate  220 mg Oral Daily    PRN meds:    Objective: Vitals:   11/03/19 2030 11/04/19 0509  BP: 117/62 (!) 101/57  Pulse: 80 69  Resp: 16 18  Temp: 97.9 F (36.6 C) 97.8 F (36.6 C)  SpO2: 96% 99%    Intake/Output Summary (Last 24 hours) at 11/04/2019 1425 Last data filed at 11/04/2019 1348 Gross per 24 hour  Intake 1640 ml  Output 1225 ml  Net 415 ml   Filed Weights   10/26/19 1812  Weight: (!) 183.9 kg   Weight change:  Body mass index is 54.99 kg/m.   Physical Exam: General exam: Not in physical distress.  Skin:  No rashes, lesions or ulcers. HEENT: Atraumatic, normocephalic, supple neck, no obvious bleeding Lungs: Clear to auscultation bilaterally CVS: Regular rate and rhythm, no murmur GI/Abd soft, nontender, nondistended, bowel sound present CNS: Alert, awake, not interested in answering questions. Psychiatry: Not restless or agitated this morning. Extremities: No pedal edema, no calf tenderness  Data Review: I have personally reviewed the laboratory data and studies available.  Recent Labs  Lab 10/31/19 0338 11/01/19 0332 11/02/19 0303 11/03/19 0246 11/04/19 0245  WBC 15.0* 20.1* 21.4* 18.0* 18.6*  NEUTROABS 11.5* 15.3* 16.5* 14.8* 14.4*  HGB 11.2* 12.2* 11.6* 11.9* 11.8*  HCT 34.9* 38.2* 36.8* 37.7* 36.1*  MCV 96.7 98.5 97.4 98.4 96.3  PLT 212 231 248 238 221   Recent Labs  Lab 10/29/19 0834 10/30/19 1100 10/31/19 1125 11/01/19 0332 11/01/19 0831 11/02/19 0303 11/03/19 0246 11/04/19 0245  NA 141 142 142 142 143 140 136 140  K 5.0 4.7 5.0 6.5* 5.2* 5.8* 5.2* 4.9  CL 108 105 106 107 107 105 101 102  CO2 24 25 27 26 25 25 24 27   GLUCOSE 124* 183* 169* 134* 113* 123* 189* 146*  BUN 47* 47* 48* 50* 51* 53* 54*  58*  CREATININE 1.91* 2.02* 2.14* 1.96* 2.02* 2.02* 2.03* 1.97*  CALCIUM 9.5 9.7 9.5 9.8 10.1 9.7 9.2 9.8  MG 1.9 1.9 2.2 2.2  --   --   --   --   PHOS 3.8 4.3  --   --   --   --   --   --     Terrilee Croak, MD  [facility] 11/04/2019

## 2019-11-04 NOTE — Consult Note (Signed)
I contacted Dr. Pietro Cassis to inform him that patient was evaluated by psychiatric service and there was a recommendation to place him back on home medications to avoid psychiatric decompensation. Review of medications revealed that patient is currently on adequate doses of anti-psychotic. It should be noted that patient is prone to developing QTc prolongation and will warn against giving more anti-psychotics.  Patient agitation may also be due to shortness of breath from COPD and congestive heart failure. Regarding placement- Consider referral to social worker to assist.  Corena Pilgrim, MD Attending psychiatrist.

## 2019-11-05 ENCOUNTER — Inpatient Hospital Stay (HOSPITAL_COMMUNITY): Payer: Medicare Other

## 2019-11-05 LAB — CBC WITH DIFFERENTIAL/PLATELET
Abs Immature Granulocytes: 0.68 10*3/uL — ABNORMAL HIGH (ref 0.00–0.07)
Basophils Absolute: 0.1 10*3/uL (ref 0.0–0.1)
Basophils Relative: 1 %
Eosinophils Absolute: 0.1 10*3/uL (ref 0.0–0.5)
Eosinophils Relative: 1 %
HCT: 39.5 % (ref 39.0–52.0)
Hemoglobin: 12.5 g/dL — ABNORMAL LOW (ref 13.0–17.0)
Immature Granulocytes: 4 %
Lymphocytes Relative: 18 %
Lymphs Abs: 3.1 10*3/uL (ref 0.7–4.0)
MCH: 31.4 pg (ref 26.0–34.0)
MCHC: 31.6 g/dL (ref 30.0–36.0)
MCV: 99.2 fL (ref 80.0–100.0)
Monocytes Absolute: 1.3 10*3/uL — ABNORMAL HIGH (ref 0.1–1.0)
Monocytes Relative: 8 %
Neutro Abs: 12 10*3/uL — ABNORMAL HIGH (ref 1.7–7.7)
Neutrophils Relative %: 68 %
Platelets: 208 10*3/uL (ref 150–400)
RBC: 3.98 MIL/uL — ABNORMAL LOW (ref 4.22–5.81)
RDW: 14.6 % (ref 11.5–15.5)
WBC: 17.2 10*3/uL — ABNORMAL HIGH (ref 4.0–10.5)
nRBC: 0 % (ref 0.0–0.2)

## 2019-11-05 MED ORDER — CLOTRIMAZOLE 1 % EX CREA
TOPICAL_CREAM | Freq: Two times a day (BID) | CUTANEOUS | Status: DC
  Administered 2019-11-05 – 2019-11-25 (×5): 1 via TOPICAL
  Filled 2019-11-05 (×7): qty 15

## 2019-11-05 NOTE — Progress Notes (Signed)
PROGRESS NOTE  [name]  DOB: September 29, 1951  PCP: [name], [place] DQQ:[id number]  DOA: 10/25/2019  LOS: 11 days   Brief narrative: Patient is a 68 year old male, obese, with PMH significant for paranoid schizophrenia, prolonged QT interval, non-Hodgkin's lymphoma, hypertension, hyperlipidemia, COPD, diastolic congestive heart failure and chronic kidney disease.  Patient lives in a group home.   12/9, patient was sent to the ED for worsening shortness of breath, fever chills and low O2 sat at 89%.  In the ED, patient was started on O2 by nasal cannula.   Work-up showed elevated creatinine to 3.09 (up from 1.67).  COVID-19 PCR was positive.   Chest x-ray revealed multifocal pneumonia.  Patient was admitted to hospital service for further evaluation and management.  Subjective: Patient was seen and examined this morning.  [name].   Propped up in bed. Alert, awake, does not make eye contact.   Denies any pain, shortness of breath, discomfort.   Assessment/Plan: Pneumonia due to COVID-19 virus -Presented with fever, hypoxia and shortness of breath. -COVID-19 PCR positive -Chest x-ray showed multifocal pneumonia -Inflammatory markers were elevated.  Procalcitonin negative. -Patient was treated with 5-day course of remdesivir, completed on 12/13 and 10-day course of Decadron completed 12/18. -Patient completed a course of IV Rocephin and doxycycline. -Currently on 2 L oxygen by nasal cannula.  Wean down as tolerated. -Continue inhalers, cough suppressants and vitamins.  AKI on CKD stage IV -Presented with elevated creatinine to 3.09 (up from 1.67).  -Prior to admission, patient was on daily meloxicam at home, which is currently on hold -Renal ultrasound unremarkable -Unable to continue hydration because of Covid pneumonia.  Creatinine gradually improving.  Acute urinary retention -Patient had a Foley catheter placed on admission for urine retention.     -12/16, catheter was removed patient passed voiding trial but he had retention again the next day.  -12/17, a coud catheter was inserted.  Needs voiding trial tried again in the next 1 to 2 weeks.  Hyperkalemia -For some unknown reason, patient potassium level has been elevated for last 3 to 4 days.  Peaked at 6.5.  Fluctuating level.  Potassium level normal on last blood check on 12/19.  Repeat BMP tomorrow.Marland Kitchen  He is not on any potassium supplement or potassium sparing diuretics.  Continue to monitor.  QTC prolongation EKG with prolonged QTC on admission Likely due to psych meds Previous hospitalist spoke to psychiatrist Dr [name], recommneded keeping patient on previous psych medication regimen, as he has been on this for >30 years and was previously very difficult to manage his psych condition. Advised against changing or stopping this regimen to avoid a manic episode EKG 12/20 showed QTC at 478 ms. Monitor electrolytes closely  Chronic diastolic HF Stable. Echo done in 09/2018 showed EF of 65 to 19%, with LV diastolic dysfunction Monitor closely  Hyperlipidemia Continue Lipitor  Hypothyroidism Continue Synthroid  Intermittent hallucinations/agitation schizoaffective disorder Continue clozapine, benztropine, Thorazine, Lamictal, Lexapro Patient is getting intermittent IV Ativan as needed for agitation/hallucinations. Psychiatry consultation obtained on 12/12  Morbid obesity - Body mass index is 54.99 kg/m. Patient has been advised to make an attempt to improve diet and exercise patterns to aid in weight loss.  Mobility: Encourage ambulation Diet: Cardiac diet Fluid: No IV fluid DVT prophylaxis:  Lovenox subcu Code Status:  Full code Family Communication:  Not at bedside Expected Discharge:  Difficult to find SNF because of underlying psychiatric issues.  Case management aware  Consultants:  Procedures:    Antimicrobials: Anti-infectives (From admission,  onward)   Start     Dose/Rate Route Frequency Ordered Stop   10/26/19 1000  remdesivir 100 mg in sodium chloride 0.9 % 100 mL IVPB  Status:  Discontinued     100 mg 200 mL/hr over 30 Minutes Intravenous Daily 10/25/19 2229 10/25/19 2254   10/26/19 1000  remdesivir 100 mg in sodium chloride 0.9 % 100 mL IVPB     100 mg 200 mL/hr over 30 Minutes Intravenous Daily 10/25/19 2135 10/29/19 1045   10/25/19 2230  cefTRIAXone (ROCEPHIN) 1 g in sodium chloride 0.9 % 100 mL IVPB  Status:  Discontinued     1 g 200 mL/hr over 30 Minutes Intravenous Every 24 hours 10/25/19 2228 10/30/19 1746   10/25/19 2230  doxycycline (VIBRA-TABS) tablet 100 mg  Status:  Discontinued     100 mg Oral Every 12 hours 10/25/19 2229 10/30/19 1746   10/25/19 2228  remdesivir 200 mg in sodium chloride 0.9% 250 mL IVPB  Status:  Discontinued     200 mg 580 mL/hr over 30 Minutes Intravenous Once 10/25/19 2229 10/25/19 2254   10/25/19 2200  remdesivir 200 mg in sodium chloride 0.9% 250 mL IVPB     200 mg 580 mL/hr over 30 Minutes Intravenous Once 10/25/19 2135 10/26/19 0256        Code Status: Full Code   Diet Order            Diet Heart Room service appropriate? Yes; Fluid consistency: Thin  Diet effective now              Infusions:    Scheduled Meds: . aspirin EC  81 mg Oral Daily  . atorvastatin  20 mg Oral q1800  . benztropine  0.5 mg Oral BID  . Chlorhexidine Gluconate Cloth  6 each Topical Daily  . chlorproMAZINE  100 mg Oral QHS  . chlorproMAZINE  50 mg Oral TID  . cholecalciferol  5,000 Units Oral Daily  . cloZAPine  150 mg Oral BID  . docusate sodium  100 mg Oral Daily  . enoxaparin (LOVENOX) injection  80 mg Subcutaneous QHS  . escitalopram  10 mg Oral QAC breakfast  . fluticasone  1 puff Inhalation BID  . folic acid  1 mg Oral Daily  . Ipratropium-Albuterol  1 puff Inhalation TID  . lamoTRIgine  50 mg Oral TID  . levothyroxine  100 mcg Oral Daily  . metoprolol tartrate  12.5 mg Oral BID  .  multivitamin with minerals  1 tablet Oral Daily  . nystatin  1 Bottle Topical Daily  . polyethylene glycol  17 g Oral Daily  . senna  1 tablet Oral Daily  . thiamine  100 mg Oral Daily  . vitamin C  500 mg Oral Daily  . zinc sulfate  220 mg Oral Daily    PRN meds:    Objective: Vitals:   11/05/19 0518 11/05/19 1419  BP: 109/63 104/67  Pulse: 77 68  Resp: 20 18  Temp: 97.7 F (36.5 C) 97.9 F (36.6 C)  SpO2: 96% 98%    Intake/Output Summary (Last 24 hours) at 11/05/2019 1517 Last data filed at 11/05/2019 1439 Gross per 24 hour  Intake 660 ml  Output 950 ml  Net -290 ml   Filed Weights   10/26/19 1812  Weight: (!) 183.9 kg   Weight change:  Body mass index is 54.99 kg/m.   Physical Exam: General exam: Not in physical  distress.  Skin: No rashes, lesions or ulcers. HEENT: Atraumatic, normocephalic, supple neck, no obvious bleeding Lungs: Clear to auscultation bilaterally CVS: Regular rate and rhythm, no murmur GI/Abd soft, nontender, nondistended, bowel sound present CNS: Alert, awake, not interested in answering questions. Psychiatry: Not restless or agitated this morning. Extremities: No pedal edema, no calf tenderness  Data Review: I have personally reviewed the laboratory data and studies available.  Recent Labs  Lab 11/01/19 0332 11/02/19 0303 11/03/19 0246 11/04/19 0245 11/05/19 0252  WBC 20.1* 21.4* 18.0* 18.6* 17.2*  NEUTROABS 15.3* 16.5* 14.8* 14.4* 12.0*  HGB 12.2* 11.6* 11.9* 11.8* 12.5*  HCT 38.2* 36.8* 37.7* 36.1* 39.5  MCV 98.5 97.4 98.4 96.3 99.2  PLT 231 248 238 221 208   Recent Labs  Lab 10/30/19 1100 10/31/19 1125 11/01/19 0332 11/01/19 0831 11/02/19 0303 11/03/19 0246 11/04/19 0245  NA 142 142 142 143 140 136 140  K 4.7 5.0 6.5* 5.2* 5.8* 5.2* 4.9  CL 105 106 107 107 105 101 102  CO2 25 27 26 25 25 24 27   GLUCOSE 183* 169* 134* 113* 123* 189* 146*  BUN 47* 48* 50* 51* 53* 54* 58*  CREATININE 2.02* 2.14* 1.96* 2.02* 2.02*  2.03* 1.97*  CALCIUM 9.7 9.5 9.8 10.1 9.7 9.2 9.8  MG 1.9 2.2 2.2  --   --   --   --   PHOS 4.3  --   --   --   --   --   --     Terrilee Croak, MD  [facility] 11/05/2019

## 2019-11-05 NOTE — Progress Notes (Signed)
Pt restless, anxious yelling out inappropriate words. Pt pushed bedside table against wall yelling to get up and his "scrotum hurt" Tylendol and Ativan 1 mg given. Sitter remains at bedside. Will cont to monitor pt for effectiveness. SRP, RN

## 2019-11-05 NOTE — Plan of Care (Signed)
  Problem: Education: Goal: Knowledge of General Education information will improve Description: Including pain rating scale, medication(s)/side effects and non-pharmacologic comfort measures Outcome: Progressing   Problem: Health Behavior/Discharge Planning: Goal: Ability to manage health-related needs will improve 11/05/2019 1152 by Zadie Rhine, RN Outcome: Progressing   Problem: Clinical Measurements: Goal: Will remain free from infection Outcome: Progressing   Problem: Clinical Measurements: Goal: Respiratory complications will improve Outcome: Progressing   Problem: Clinical Measurements: Goal: Cardiovascular complication will be avoided Outcome: Progressing   Problem: Coping: Goal: Level of anxiety will decrease Outcome: Not Progressing

## 2019-11-05 NOTE — Progress Notes (Addendum)
Pt has increase discomfort in scrotal area.Yelling out stating "I miserable my balls hurt." Pt has localized redness in the pubis area, bilateral flank, upper back,  upper inner and anterior thighs. Nystatin powder applied and MD made aware. Orders received, pt updated. Sitter remains at pt side; enforcing safety.Will continue to monitor. SRP, RN

## 2019-11-06 LAB — BASIC METABOLIC PANEL
Anion gap: 6 (ref 5–15)
BUN: 47 mg/dL — ABNORMAL HIGH (ref 8–23)
CO2: 31 mmol/L (ref 22–32)
Calcium: 9.4 mg/dL (ref 8.9–10.3)
Chloride: 104 mmol/L (ref 98–111)
Creatinine, Ser: 1.89 mg/dL — ABNORMAL HIGH (ref 0.61–1.24)
GFR calc Af Amer: 42 mL/min — ABNORMAL LOW (ref >60)
GFR calc non Af Amer: 36 mL/min — ABNORMAL LOW (ref >60)
Glucose, Bld: 103 mg/dL — ABNORMAL HIGH (ref 70–99)
Potassium: 4.9 mmol/L (ref 3.5–5.1)
Sodium: 141 mmol/L (ref 135–145)

## 2019-11-06 MED ORDER — CHLORHEXIDINE GLUCONATE CLOTH 2 % EX PADS
6.0000 | MEDICATED_PAD | Freq: Every day | CUTANEOUS | Status: DC
  Administered 2019-11-06 – 2019-11-15 (×10): 6 via TOPICAL

## 2019-11-06 MED ORDER — ENSURE ENLIVE PO LIQD
237.0000 mL | Freq: Two times a day (BID) | ORAL | Status: DC
  Administered 2019-11-06 – 2019-11-28 (×37): 237 mL via ORAL

## 2019-11-06 NOTE — Progress Notes (Signed)
Patient had some hematuria in the catheter. Patient may have pulled on the catheter as he is confused. PCP was notified.

## 2019-11-06 NOTE — Progress Notes (Signed)
Pt rash has improved for today, decreased redness, ordered treatment applied as ordered. Pt tolerating. SRP, RN

## 2019-11-06 NOTE — Progress Notes (Signed)
CMT called stating NO Data Tele. Pt pulling at electrode and tele box. Pt restless, yelling out inappropriate statements. Using profane and vulgar language. Encourage pt to focus on TV. MD made aware. SRP,RN

## 2019-11-06 NOTE — Progress Notes (Signed)
Physical Therapy Treatment Patient Details Name: [name] MRN: [id number] DOB: 19-Jun-1951 Today's Date: 11/06/2019    History of Present Illness 68 yo male admitted with COVID (+). Hx of schizoaffective d/o, NHL, CKD, obesity, CHF, COPD. Pt was admitted from a group home.    PT Comments    Pt in bed on 2 lts at 98% sleeping.  Difficult to arouse.  Spllepy/groggy state.  Brief moments of eyes open and engagement.  Partially assisted to EOB.  Responds to name and following simple functional commands.  Assisted back to supine and + 2 assist to scoot to [facility].  RN reports he was alert earlier but pt had recently take an Ativan. Pt will need 24/7 assist and ST Rehab at SNF prior to returning to [place].    Follow Up Recommendations  SNF     Equipment Recommendations  None recommended by PT    Recommendations for Other Services       Precautions / Restrictions Precautions Precautions: Fall Precaution Comments: Schizophrenia resides in a Group Home Restrictions Weight Bearing Restrictions: No    Mobility  Bed Mobility Overal bed mobility: Needs Assistance Bed Mobility: Supine to Sit;Sit to Supine     Supine to sit: Max assist Sit to supine: Total assist   General bed mobility comments: Pt in bed difficult to arouse.  AxO x 1 and able to follow simple, functional commands.  Assist pt to partial EOB.  Very sleepy/groggy state.  Transfers                 General transfer comment: unable due to sedation  Ambulation/Gait             General Gait Details: unable due to sedation   Stairs             Wheelchair Mobility    Modified Rankin (Stroke Patients Only)       Balance                                            Cognition Arousal/Alertness: Lethargic                                     General Comments: RN ststed he was given an Ativan recently.  Has a Air cabin crew in room.      Exercises      General Comments        Pertinent Vitals/Pain      Home Living                      Prior Function            PT Goals (current goals can now be found in the care plan section) Progress towards PT goals: Progressing toward goals    Frequency    Min 3X/week      PT Plan Current plan remains appropriate    Co-evaluation              AM-PAC PT "6 Clicks" Mobility   Outcome Measure  Help needed turning from your back to your side while in a flat bed without using bedrails?: A Lot Help needed moving from lying on your back to sitting on the side of a flat bed without using bedrails?: A Lot  Help needed moving to and from a bed to a chair (including a wheelchair)?: A Lot Help needed standing up from a chair using your arms (e.g., wheelchair or bedside chair)?: A Lot Help needed to walk in hospital room?: A Lot Help needed climbing 3-5 steps with a railing? : Total 6 Click Score: 11    End of Session Equipment Utilized During Treatment: Oxygen Activity Tolerance: Patient tolerated treatment well;Patient limited by fatigue;Other (comment) Patient left: in bed;with call bell/phone within reach;with bed alarm set;with nursing/sitter in room Nurse Communication: Mobility status PT Visit Diagnosis: Unsteadiness on feet (R26.81);Difficulty in walking, not elsewhere classified (R26.2);Muscle weakness (generalized) (M62.81)     Time: 6160-7371 PT Time Calculation (min) (ACUTE ONLY): 18 min  Charges:  $Therapeutic Activity: 8-22 mins                     {Vikki Gains  PTA Acute  Rehabilitation Services Pager      [phone] Office      [phone]

## 2019-11-06 NOTE — Progress Notes (Signed)
PROGRESS NOTE  [name]  DOB: [birth year]  PCP: [name], [place] XJO:[id number]  DOA: 10/25/2019  LOS: 12 days   Brief narrative: Patient is a 68 year old male, obese, with PMH significant for paranoid schizophrenia, prolonged QT interval, non-Hodgkin's lymphoma, hypertension, hyperlipidemia, COPD, diastolic congestive heart failure and chronic kidney disease.  Patient lives in a group home.   12/9, patient was sent to the ED for worsening shortness of breath, fever chills and low O2 sat at 89%.  In the ED, patient was started on O2 by nasal cannula.   Work-up showed elevated creatinine to 3.09 (up from 1.67).  COVID-19 PCR was positive.   Chest x-ray revealed multifocal pneumonia.  Patient was admitted to hospital service for further evaluation and management. Was initially hospitalized for Covid pneumonia for which he completed the treatment.  His hospital course was complicated by AKI, acute urinary retention. He also currently seems to have some drug reaction versus fungal reaction in the groin, left axilla and left flank  Subjective: Patient was seen and examined this morning.  [name].   Not in distress.  Not agitated or restless today. Denies pain.  Yesterday he complained of pain in his testicles.  Ultrasound was normal, did not show any evidence of epididymitis.  Assessment/Plan: Pneumonia due to COVID-19 virus -Presented with fever, hypoxia and shortness of breath. -COVID-19 PCR positive -Chest x-ray showed multifocal pneumonia -Inflammatory markers were elevated.  Procalcitonin negative. -Patient was treated with 5-day course of remdesivir, completed on 12/13 and 10-day course of Decadron completed 12/18. -Patient completed a course of IV Rocephin and doxycycline. -Currently on 2 L oxygen by nasal cannula. Wean down as tolerated. -Continue inhalers, cough suppressants and vitamins.  AKI on CKD stage IV -Presented with elevated creatinine to  3.09 (up from 1.67).  -Prior to admission, patient was on daily meloxicam at home, which is currently on hold -Renal ultrasound unremarkable -Unable to continue hydration because of Covid pneumonia.  Creatinine gradually improving but not back to baseline yet.  Repeat BMP tomorrow.  Acute urinary retention -Patient had a Foley catheter placed on admission for urine retention.   -12/16, catheter was removed patient passed voiding trial but he had retention again the next day.  -12/17, a coud catheter was inserted.  Needs voiding trial tried again in the next 1 to 2 weeks. -12/20 -, no pain in the scrotum.  Ultrasound scrotum normal.  Drug reaction/fungal infection -12/19, I noticed blanching hyperemic pruritic area in his groin.  Suspicions were tinea cruris versus contact dermatitis from the adhesives used to secure Foley catheter versus drug reaction.  He has been getting nystatin powder twice a day. -12/20, I also started him on Lotrimin cream. He does not seem to be better. -12/21, I noticed similar rash in his left flank as well as left axilla today.  He is on multiple psychiatric medication which I would not hold.  He is currently off all antibiotics.  I do not see any offensive medication.  Continue nystatin powder and Lotrimin cream.  Continue to monitor.  Hyperkalemia -For some unknown reason, patient potassium level was elevated and peaked at 6.5. He is not on any potassium supplement or potassium sparing diuretics.  Potassium level gradually normalized to normal.  QTC prolongation EKG with prolonged QTC on admission Likely due to psych meds Previous hospitalist spoke to psychiatrist Dr [name], recommneded keeping patient on previous psych medication regimen, as he has been on this for >30 years and was previously  very difficult to manage his psych condition. Advised against changing or stopping this regimen to avoid a manic episode EKG 12/20 showed QTC at 478 ms. Monitor  electrolytes closely  Chronic diastolic HF Stable. Echo done in 09/2018 showed EF of 65 to 15%, with LV diastolic dysfunction Monitor closely  Hyperlipidemia Continue Lipitor  Hypothyroidism Continue Synthroid  Intermittent hallucinations/agitation schizoaffective disorder Continue clozapine, benztropine, Thorazine, Lamictal, Lexapro Patient is getting intermittent IV Ativan as needed for agitation/hallucinations. Psychiatry consultation obtained on 12/12  Morbid obesity - Body mass index is 54.99 kg/m. Patient has been advised to make an attempt to improve diet and exercise patterns to aid in weight loss.  Mobility: Encourage ambulation Diet: Cardiac diet Fluid: No IV fluid DVT prophylaxis:  Lovenox subcu Code Status:  Full code Family Communication:  Not at bedside Expected Discharge:  Difficult to find SNF because of underlying psychiatric issues.  Case management aware  Consultants:    Procedures:    Antimicrobials: Anti-infectives (From admission, onward)   Start     Dose/Rate Route Frequency Ordered Stop   10/26/19 1000  remdesivir 100 mg in sodium chloride 0.9 % 100 mL IVPB  Status:  Discontinued     100 mg 200 mL/hr over 30 Minutes Intravenous Daily 10/25/19 2229 10/25/19 2254   10/26/19 1000  remdesivir 100 mg in sodium chloride 0.9 % 100 mL IVPB     100 mg 200 mL/hr over 30 Minutes Intravenous Daily 10/25/19 2135 10/29/19 1045   10/25/19 2230  cefTRIAXone (ROCEPHIN) 1 g in sodium chloride 0.9 % 100 mL IVPB  Status:  Discontinued     1 g 200 mL/hr over 30 Minutes Intravenous Every 24 hours 10/25/19 2228 10/30/19 1746   10/25/19 2230  doxycycline (VIBRA-TABS) tablet 100 mg  Status:  Discontinued     100 mg Oral Every 12 hours 10/25/19 2229 10/30/19 1746   10/25/19 2228  remdesivir 200 mg in sodium chloride 0.9% 250 mL IVPB  Status:  Discontinued     200 mg 580 mL/hr over 30 Minutes Intravenous Once 10/25/19 2229 10/25/19 2254   10/25/19 2200   remdesivir 200 mg in sodium chloride 0.9% 250 mL IVPB     200 mg 580 mL/hr over 30 Minutes Intravenous Once 10/25/19 2135 10/26/19 0256        Code Status: Full Code   Diet Order            Diet Heart Room service appropriate? Yes; Fluid consistency: Thin  Diet effective now              Infusions:    Scheduled Meds: . aspirin EC  81 mg Oral Daily  . atorvastatin  20 mg Oral q1800  . benztropine  0.5 mg Oral BID  . Chlorhexidine Gluconate Cloth  6 each Topical Daily  . chlorproMAZINE  100 mg Oral QHS  . chlorproMAZINE  50 mg Oral TID  . cholecalciferol  5,000 Units Oral Daily  . clotrimazole   Topical BID  . cloZAPine  150 mg Oral BID  . docusate sodium  100 mg Oral Daily  . enoxaparin (LOVENOX) injection  80 mg Subcutaneous QHS  . escitalopram  10 mg Oral QAC breakfast  . fluticasone  1 puff Inhalation BID  . folic acid  1 mg Oral Daily  . Ipratropium-Albuterol  1 puff Inhalation TID  . lamoTRIgine  50 mg Oral TID  . levothyroxine  100 mcg Oral Daily  . metoprolol tartrate  12.5 mg Oral BID  .  multivitamin with minerals  1 tablet Oral Daily  . nystatin  1 Bottle Topical Daily  . polyethylene glycol  17 g Oral Daily  . senna  1 tablet Oral Daily  . thiamine  100 mg Oral Daily  . vitamin C  500 mg Oral Daily  . zinc sulfate  220 mg Oral Daily    PRN meds:    Objective: Vitals:   11/05/19 2109 11/06/19 0505  BP: 112/63 118/65  Pulse: 72 78  Resp: 20 16  Temp: 98.1 F (36.7 C) 98.4 F (36.9 C)  SpO2: 96% 96%    Intake/Output Summary (Last 24 hours) at 11/06/2019 1306 Last data filed at 11/06/2019 1057 Gross per 24 hour  Intake 900 ml  Output 1300 ml  Net -400 ml   Filed Weights   10/26/19 1812  Weight: (!) 183.9 kg   Weight change:  Body mass index is 54.99 kg/m.   Physical Exam: General exam: Not in physical distress.  Skin: No rashes, lesions or ulcers. HEENT: Atraumatic, normocephalic, supple neck, no obvious bleeding Lungs: Clear to  auscultation bilaterally CVS: Regular rate and rhythm, no murmur GI/Abd soft, nontender, nondistended, bowel sound present Groin: Blanching hyperemic drug reaction-like lesion noted.  Similar finding left flank and left axilla. CNS: Alert, awake, not interested in answering questions. Psychiatry: Not restless or agitated this morning. Extremities: No pedal edema, no calf tenderness  Data Review: I have personally reviewed the laboratory data and studies available.  Recent Labs  Lab 11/01/19 0332 11/02/19 0303 11/03/19 0246 11/04/19 0245 11/05/19 0252  WBC 20.1* 21.4* 18.0* 18.6* 17.2*  NEUTROABS 15.3* 16.5* 14.8* 14.4* 12.0*  HGB 12.2* 11.6* 11.9* 11.8* 12.5*  HCT 38.2* 36.8* 37.7* 36.1* 39.5  MCV 98.5 97.4 98.4 96.3 99.2  PLT 231 248 238 221 208   Recent Labs  Lab 10/31/19 1125 11/01/19 0332 11/01/19 0831 11/02/19 0303 11/03/19 0246 11/04/19 0245 11/06/19 0257  NA 142 142 143 140 136 140 141  K 5.0 6.5* 5.2* 5.8* 5.2* 4.9 4.9  CL 106 107 107 105 101 102 104  CO2 27 26 25 25 24 27 31   GLUCOSE 169* 134* 113* 123* 189* 146* 103*  BUN 48* 50* 51* 53* 54* 58* 47*  CREATININE 2.14* 1.96* 2.02* 2.02* 2.03* 1.97* 1.89*  CALCIUM 9.5 9.8 10.1 9.7 9.2 9.8 9.4  MG 2.2 2.2  --   --   --   --   --     Terrilee Croak, MD  [facility] 11/06/2019

## 2019-11-06 NOTE — Progress Notes (Signed)
Initial Nutrition Assessment  RD working remotely.   DOCUMENTATION CODES:   Not applicable  INTERVENTION:  - will order Ensure Enlive BID, each supplement provides 350 kcal and 20 grams of protein. - continue to encourage PO intakes.  - re-weigh today.    NUTRITION DIAGNOSIS:   Increased nutrient needs related to acute illness as evidenced by estimated needs.  GOAL:   Patient will meet greater than or equal to 90% of their needs  MONITOR:   PO intake, Supplement acceptance, Labs, Weight trends  REASON FOR ASSESSMENT:   LOS, Other (Comment)(pressure injury report)  ASSESSMENT:   68 year old male with medical history of obesity, paranoid schizophrenia, prolonged QT interval, non-Hodgkin's lymphoma, HTN, hyperlipidemia, COPD, CHF, and CKD. He lives in a group home. He was sent to the ED on 12/9 for worsening SOB, fever, chills, and low O2 sat (89%). COVID-19 PCR was positive and CXR showed multifocal PNA. He has completed treatment for COVID; hospital course complicated by AKI, acute urinary retention, and drug reaction vs fungal infection to groin, L axilla, and L flank.  Per flow sheet documentation, he is a/o to self only. He consumed 100% of dinner on 12/18; 75% of breakfast and 100% of lunch and dinner on 12/19; 50% of breakfast and lunch and 100% of dinner on 12/20; 75% of breakfast today. Patient has not been weighed since admission and unsure if admission weight is entirely accurate.   Per notes, patient is a difficult SNF placement d/t hx of schizophrenia.    Labs reviewed; BUN: 47 mg/dl, creatinine: 1.89 mg/dl, GFR: 36 ml/min. Medications reviewed; 5000 units cholecalciferol/day, 100 mg colace/day, 1 mg folvite/day, 100 mcg oral synthroid/day, daily multivitamin with minerals, 1 packet miralax/day, 1 tablet senokot/day, 100 mg thiamine/day, 500 mg ascorbic acid/day, 220 mg zinc sulfate/day.      NUTRITION - FOCUSED PHYSICAL EXAM:  unable to complete for COVID+  patient.   Diet Order:   Diet Order            Diet Heart Room service appropriate? Yes; Fluid consistency: Thin  Diet effective now              EDUCATION NEEDS:   No education needs have been identified at this time  Skin:  Skin Assessment: Reviewed RN Assessment  Last BM:  12/18  Height:   Ht Readings from Last 1 Encounters:  10/26/19 6' (1.829 m)    Weight:   Wt Readings from Last 1 Encounters:  10/26/19 (!) 183.9 kg    Ideal Body Weight:  80.9 kg  BMI:  Body mass index is 54.99 kg/m.  Estimated Nutritional Needs:   Kcal:  2500-3704 kcal  Protein:  110-120 grams  Fluid:  >/= 2.2 L/day      Jarome Matin, MS, RD, LDN, [facility] Inpatient Clinical Dietitian Pager # [phone] After hours/weekend pager # [phone]

## 2019-11-06 NOTE — Care Management Important Message (Signed)
Important Message  Patient Details IM Letter given to [place] Case Manager to present to the Patient Name: [name] MRN: [id number] Date of Birth: 1951/05/29   Medicare Important Message Given:  Yes     [name] 11/06/2019, 12:41 PM

## 2019-11-06 NOTE — Plan of Care (Signed)
  Problem: Clinical Measurements: Goal: Will remain free from infection Outcome: Progressing Goal: Diagnostic test results will improve Outcome: Progressing   Problem: Activity: Goal: Risk for activity intolerance will decrease Outcome: Progressing   

## 2019-11-07 LAB — CBC WITH DIFFERENTIAL/PLATELET
Abs Immature Granulocytes: 0.13 10*3/uL — ABNORMAL HIGH (ref 0.00–0.07)
Basophils Absolute: 0 10*3/uL (ref 0.0–0.1)
Basophils Relative: 0 %
Eosinophils Absolute: 0.1 10*3/uL (ref 0.0–0.5)
Eosinophils Relative: 1 %
HCT: 38.9 % — ABNORMAL LOW (ref 39.0–52.0)
Hemoglobin: 12.7 g/dL — ABNORMAL LOW (ref 13.0–17.0)
Immature Granulocytes: 1 %
Lymphocytes Relative: 13 %
Lymphs Abs: 1.9 10*3/uL (ref 0.7–4.0)
MCH: 31.4 pg (ref 26.0–34.0)
MCHC: 32.6 g/dL (ref 30.0–36.0)
MCV: 96 fL (ref 80.0–100.0)
Monocytes Absolute: 0.9 10*3/uL (ref 0.1–1.0)
Monocytes Relative: 6 %
Neutro Abs: 11.3 10*3/uL — ABNORMAL HIGH (ref 1.7–7.7)
Neutrophils Relative %: 79 %
Platelets: 155 10*3/uL (ref 150–400)
RBC: 4.05 MIL/uL — ABNORMAL LOW (ref 4.22–5.81)
RDW: 14.6 % (ref 11.5–15.5)
WBC: 14.3 10*3/uL — ABNORMAL HIGH (ref 4.0–10.5)
nRBC: 0 % (ref 0.0–0.2)

## 2019-11-07 LAB — BASIC METABOLIC PANEL
Anion gap: 9 (ref 5–15)
BUN: 38 mg/dL — ABNORMAL HIGH (ref 8–23)
CO2: 28 mmol/L (ref 22–32)
Calcium: 9.6 mg/dL (ref 8.9–10.3)
Chloride: 104 mmol/L (ref 98–111)
Creatinine, Ser: 1.83 mg/dL — ABNORMAL HIGH (ref 0.61–1.24)
GFR calc Af Amer: 44 mL/min — ABNORMAL LOW (ref >60)
GFR calc non Af Amer: 38 mL/min — ABNORMAL LOW (ref >60)
Glucose, Bld: 115 mg/dL — ABNORMAL HIGH (ref 70–99)
Potassium: 4.6 mmol/L (ref 3.5–5.1)
Sodium: 141 mmol/L (ref 135–145)

## 2019-11-07 NOTE — Progress Notes (Signed)
Patient  Continues to be a fall safety risk. Sitter is at bedside. Pt appears to be restless and agitated.Pt continues to use profane language attempting to get out of bed. Will continue to monitor.

## 2019-11-07 NOTE — Progress Notes (Signed)
V.O received from Hospitalist to discontinue telemetry.

## 2019-11-07 NOTE — Progress Notes (Signed)
PROGRESS NOTE  [name]  DOB: 1951-03-31  PCP: [name], [place] WUJ:[id number]  DOA: 10/25/2019  LOS: 13 days   Brief narrative: Patient is a 68 year old male, obese, with PMH significant for paranoid schizophrenia, prolonged QT interval, non-Hodgkin's lymphoma, hypertension, hyperlipidemia, COPD, diastolic congestive heart failure and chronic kidney disease.  Patient lives in a group home.   12/9, patient was sent to the ED for worsening shortness of breath, fever chills and low O2 sat at 89%.  In the ED, patient was started on O2 by nasal cannula.   Work-up showed elevated creatinine to 3.09 (up from 1.67).  COVID-19 PCR was positive.   Chest x-ray revealed multifocal pneumonia.  Patient was admitted to hospital service for further evaluation and management. Was initially hospitalized for Covid pneumonia for which he completed the treatment.  His hospital course was complicated by AKI, acute urinary retention. He also currently seems to have some drug reaction versus fungal reaction in the groin, left axilla and left flank  Subjective: Patient was seen and examined this morning.  [name].   Not in physical distress.  Restless this morning.  Assessment/Plan: Pneumonia due to COVID-19 virus -Presented with fever, hypoxia and shortness of breath. -COVID-19 PCR positive -Chest x-ray showed multifocal pneumonia -Inflammatory markers were elevated.  Procalcitonin negative. -Patient was treated with 5-day course of remdesivir, completed on 12/13 and 10-day course of Decadron completed 12/18. -Patient completed a course of IV Rocephin and doxycycline. -Currently on 2 L oxygen by nasal cannula. Wean down as tolerated. -Continue inhalers, cough suppressants and vitamins.  AKI on CKD stage IV -Presented with elevated creatinine to 3.09 (up from 1.67).  -Prior to admission, patient was on daily meloxicam at home, which is currently on hold -Renal ultrasound  unremarkable -Unable to continue hydration because of Covid pneumonia.  Creatinine gradually improving but not back to baseline yet.  Repeat BMP tomorrow.  Acute urinary retention -Patient had a Foley catheter placed on admission for urine retention.   -12/16, catheter was removed.  Patient passed voiding trial but he had retention again the next day.  -12/17, a coud catheter was inserted.  Needs voiding trial tried again in the next 1 to 2 weeks. -12/20 - no pain in the scrotum.  Ultrasound scrotum normal.  Drug reaction/fungal infection -12/19, I noticed blanching hyperemic pruritic area in his groin.  Suspicions were tinea cruris versus contact dermatitis from the adhesives used to secure Foley catheter versus drug reaction.  He has been getting nystatin powder twice a day. -12/20, I also started him on Lotrimin cream. He does not seem to be better. -12/21, I noticed similar rash in his left flank as well as left axilla today.  He is on multiple psychiatric medication which I would not hold.  He is currently off all antibiotics.  I do not see any offensive medication.  Continue nystatin powder and Lotrimin cream.  Continue to monitor. 12/22-no significant change.  Hyperkalemia -For some unknown reason, patient potassium level was elevated and peaked at 6.5. He is not on any potassium supplement or potassium sparing diuretics.  Potassium level gradually normalized to normal.  QTC prolongation EKG with prolonged QTC on admission Likely due to psych meds Previous hospitalist spoke to psychiatrist Dr [name], recommneded keeping patient on previous psych medication regimen, as he has been on this for >30 years and was previously very difficult to manage his psych condition. Advised against changing or stopping this regimen to avoid a manic episode  EKG 12/20 showed QTC at 478 ms. Monitor electrolytes closely  Chronic diastolic HF Stable. Echo done in 09/2018 showed EF of 65 to 70%, with  LV diastolic dysfunction Monitor closely  Hyperlipidemia Continue Lipitor  Hypothyroidism Continue Synthroid  Intermittent hallucinations/agitation schizoaffective disorder Continue clozapine, benztropine, Thorazine, Lamictal, Lexapro Patient is getting intermittent IV Ativan as needed for agitation/hallucinations. Psychiatry consultation obtained on 12/12  Morbid obesity - Body mass index is 33.91 kg/m. Patient has been advised to make an attempt to improve diet and exercise patterns to aid in weight loss.  Mobility: Encourage ambulation Diet: Cardiac diet Fluid: No IV fluid DVT prophylaxis:  Lovenox subcu Code Status:  Full code Family Communication:  Not at bedside Expected Discharge:  Difficult to find SNF because of underlying psychiatric issues.  Case management aware  Consultants:    Procedures:    Antimicrobials: Anti-infectives (From admission, onward)   Start     Dose/Rate Route Frequency Ordered Stop   10/26/19 1000  remdesivir 100 mg in sodium chloride 0.9 % 100 mL IVPB  Status:  Discontinued     100 mg 200 mL/hr over 30 Minutes Intravenous Daily 10/25/19 2229 10/25/19 2254   10/26/19 1000  remdesivir 100 mg in sodium chloride 0.9 % 100 mL IVPB     100 mg 200 mL/hr over 30 Minutes Intravenous Daily 10/25/19 2135 10/29/19 1045   10/25/19 2230  cefTRIAXone (ROCEPHIN) 1 g in sodium chloride 0.9 % 100 mL IVPB  Status:  Discontinued     1 g 200 mL/hr over 30 Minutes Intravenous Every 24 hours 10/25/19 2228 10/30/19 1746   10/25/19 2230  doxycycline (VIBRA-TABS) tablet 100 mg  Status:  Discontinued     100 mg Oral Every 12 hours 10/25/19 2229 10/30/19 1746   10/25/19 2228  remdesivir 200 mg in sodium chloride 0.9% 250 mL IVPB  Status:  Discontinued     200 mg 580 mL/hr over 30 Minutes Intravenous Once 10/25/19 2229 10/25/19 2254   10/25/19 2200  remdesivir 200 mg in sodium chloride 0.9% 250 mL IVPB     200 mg 580 mL/hr over 30 Minutes Intravenous Once  10/25/19 2135 10/26/19 0256        Code Status: Full Code   Diet Order            Diet Heart Room service appropriate? Yes; Fluid consistency: Thin  Diet effective now              Infusions:    Scheduled Meds: . aspirin EC  81 mg Oral Daily  . atorvastatin  20 mg Oral q1800  . benztropine  0.5 mg Oral BID  . Chlorhexidine Gluconate Cloth  6 each Topical Daily  . chlorproMAZINE  100 mg Oral QHS  . chlorproMAZINE  50 mg Oral TID  . cholecalciferol  5,000 Units Oral Daily  . clotrimazole   Topical BID  . cloZAPine  150 mg Oral BID  . docusate sodium  100 mg Oral Daily  . enoxaparin (LOVENOX) injection  80 mg Subcutaneous QHS  . escitalopram  10 mg Oral QAC breakfast  . feeding supplement (ENSURE ENLIVE)  237 mL Oral BID BM  . fluticasone  1 puff Inhalation BID  . folic acid  1 mg Oral Daily  . Ipratropium-Albuterol  1 puff Inhalation TID  . lamoTRIgine  50 mg Oral TID  . levothyroxine  100 mcg Oral Daily  . metoprolol tartrate  12.5 mg Oral BID  . multivitamin with minerals  1 tablet  Oral Daily  . nystatin  1 Bottle Topical Daily  . polyethylene glycol  17 g Oral Daily  . senna  1 tablet Oral Daily  . thiamine  100 mg Oral Daily  . vitamin C  500 mg Oral Daily  . zinc sulfate  220 mg Oral Daily    PRN meds:    Objective: Vitals:   11/07/19 0500 11/07/19 1256  BP:  (!) 108/59  Pulse:  89  Resp:  16  Temp:  98.1 F (36.7 C)  SpO2: 93% 98%    Intake/Output Summary (Last 24 hours) at 11/07/2019 1439 Last data filed at 11/07/2019 0759 Gross per 24 hour  Intake 60 ml  Output 975 ml  Net -915 ml   Filed Weights   10/26/19 1812 11/07/19 0939  Weight: (!) 183.9 kg 113.4 kg   Weight change:  Body mass index is 33.91 kg/m.   Physical Exam: General exam: Not in physical distress.  Skin: No rashes, lesions or ulcers. HEENT: Atraumatic, normocephalic, supple neck, no obvious bleeding Lungs: Clear to auscultation bilaterally CVS: Regular rate and  rhythm, no murmur GI/Abd soft, nontender, nondistended, bowel sound present Groin: Blanching hyperemic drug reaction-like lesion noted.  Similar finding left flank and left axilla. CNS: Alert, awake, not interested in answering questions. Psychiatry: Slightly restless this morning. Extremities: No pedal edema, no calf tenderness  Data Review: I have personally reviewed the laboratory data and studies available.  Recent Labs  Lab 11/02/19 0303 11/03/19 0246 11/04/19 0245 11/05/19 0252 11/07/19 0326  WBC 21.4* 18.0* 18.6* 17.2* 14.3*  NEUTROABS 16.5* 14.8* 14.4* 12.0* 11.3*  HGB 11.6* 11.9* 11.8* 12.5* 12.7*  HCT 36.8* 37.7* 36.1* 39.5 38.9*  MCV 97.4 98.4 96.3 99.2 96.0  PLT 248 238 221 208 155   Recent Labs  Lab 11/01/19 0332 11/02/19 0303 11/03/19 0246 11/04/19 0245 11/06/19 0257 11/07/19 0326  NA 142 140 136 140 141 141  K 6.5* 5.8* 5.2* 4.9 4.9 4.6  CL 107 105 101 102 104 104  CO2 26 25 24 27 31 28   GLUCOSE 134* 123* 189* 146* 103* 115*  BUN 50* 53* 54* 58* 47* 38*  CREATININE 1.96* 2.02* 2.03* 1.97* 1.89* 1.83*  CALCIUM 9.8 9.7 9.2 9.8 9.4 9.6  MG 2.2  --   --   --   --   --     Terrilee Croak, MD  [facility] 11/07/2019

## 2019-11-07 NOTE — TOC Progression Note (Signed)
Transition of Care [facility]) - Progression Note    Patient Details  Name: [name] MRN: [id number] Date of Birth: 02-05-1951  Transition of Care [facility]) CM/SW [place], [place] Phone Number: 11/07/2019, 2:25 PM  Clinical Narrative:    CSW followed up with [name] in the area that accept COVID positive patients. The facilities below have declined.   [name] due to Behavior Issues [facility]-Not accepting new admissions at this time [facility]-Care needs exceeding [facility]: Declined due Behavior Behavior Issues.  [name] beds available.  [facility]-Not accepting COVID patients  [facility]-not accepting COVID positive patients   CSW reached out to SNF- [name]. She reports the patient does not qualify for a COVID bed at there facility but may qualify for a recovery bed at [facility]. CSW reached out to [name] [phone] and provided the referral.  She will reconsider the referral once the patient is sitter Free for 24 hours and presenting no behaviors.   CSW reached out to the [employer] listed in the patient chart to provide updates. Unable to leave a voicemail.         Expected Discharge Plan and Services                                                 Social Determinants of Health (SDOH) Interventions    Readmission Risk Interventions Readmission Risk Prevention Plan 07/20/2018  Transportation Screening Complete  PCP or Specialist Appt within 3-5 Days Complete  Home Care Screening Complete  HRI or Home Care Consult Complete  Social Work Consult for [place] Planning/Counseling Complete  Palliative Care Screening Complete  Medication Review [employer] photographer) Complete  Some recent data might be hidden

## 2019-11-08 DIAGNOSIS — F203 Undifferentiated schizophrenia: Secondary | ICD-10-CM

## 2019-11-08 MED ORDER — ENOXAPARIN SODIUM 60 MG/0.6ML ~~LOC~~ SOLN
55.0000 mg | Freq: Every day | SUBCUTANEOUS | Status: DC
  Administered 2019-11-08 – 2019-11-09 (×2): 55 mg via SUBCUTANEOUS
  Filled 2019-11-08 (×2): qty 0.6

## 2019-11-08 NOTE — Progress Notes (Signed)
Patient pulled foley cathter out. Sitter at bedside. Will update provider for replacement order.

## 2019-11-08 NOTE — Progress Notes (Addendum)
[facility]                                                                              Patient Demographics  [name], is a 68 y.o. male, DOB - [birth year], OIN:[id number]  Admit date - 10/25/2019   Admitting Physician Bonnell Public, MD  Outpatient Primary MD for the patient is [name], [place]  Outpatient specialists:   LOS - 14  days   Medical records reviewed and are as summarized below:    No chief complaint on file.      Brief summary   Patient is a 68 year old male, obese, with PMH significant for paranoid schizophrenia, prolonged QT interval, non-Hodgkin's lymphoma, hypertension, hyperlipidemia, COPD, diastolic congestive heart failure and chronic kidney disease.  Patient lives in a group home.   12/9, patient was sent to the ED for worsening shortness of breath, fever chills and low O2 sat at 89%.  In the ED, patient was started on O2 by nasal cannula.   Work-up showed elevated creatinine to 3.09 (up from 1.67).  COVID-19 PCR was positive.   Chest x-ray revealed multifocal pneumonia.  Patient was admitted to hospital service for further evaluation and management. Was initially hospitalized for Covid pneumonia for which he completed the treatment.  His hospital course was complicated by AKI, acute urinary retention. He also currently seems to have some drug reaction versus fungal reaction in the groin, left axilla and left flank   Assessment & Plan    Principal problem  1. Acute Hypoxic Resp. Failure due to Acute Covid 19 Viral Pneumonia during the ongoing 2020 Covid 19 Pandemic - POA - Patient was hypoxic at the time of admission with O2 sats at 89%.  Presented with fevers chills and shortness of breath. -COVID-19 test was positive, chest x-ray showed multifocal pneumonia.  Inflammatory markers were elevated.  Procalcitonin negative. -Patient was treated with 5-day course of remdesivir, completed on 12/13 and 10-day course of  Decadron completed 12/18.  Continue Steroids - Decadron 6 mg IV daily, Remdesivir per pharmacy protocol  -Currently on room air -Completed a course of IV Rocephin and doxycycline.  Continue inhalers, cough suppressant and vitamins  Acute kidney injury on CKD stage IV -Presented with creatinine of 3.09, up from 1.67. -Meloxicam PTA was placed on hold.  Renal ultrasound unremarkable with no hydronephrosis or obstruction. -Creatinine has been improving, close to baseline, 1.8  Acute renal urinary retention - -Patient had a Foley catheter placed on admission for urine retention.   -12/16, catheter was removed.  Patient passed voiding trial but he had retention again the next day.  -12/17, a coud catheter was inserted. Needs voiding trial tried again in the next 1 to 2 weeks. -12/20 - no pain in the scrotum.  Ultrasound scrotum normal.  Drug reaction/fungal infection -Currently on lower extremities, left flank, axilla, groin area -Not on any antibiotics which were started during hospitalization -Started on nystatin topical   Hyperkalemia Resolved  QTC prolongation -QTC 478 on EKG 12/20 -Previous hospitalist spoke to psychiatrist, Dr. [name], recommended to continue previous psych medications that he has been chronically on for over 30 years.  Advised against changing or stopping the regimen. -Keep potassium> 4, magnesium> 2   Chronic diastolic CHF -Compensated, 2D echo in 09/2018 showed EF of 65 to 44% with LV diastolic dysfunction  Hyperlipidemia Continue Lipitor  Hypothyroidism Continue Synthroid   Intermittent hallucinations/agitation, history of schizoaffective disorder -Continue multiple psych meds including clozapine, benztropine, Thorazine, Lamictal, Lexapro -Seen by psych this admission  Morbid obesity  BMI 33.9, patient advised diet and weight control  pressure injury Stage II, left buttocks, present on admission -Wound care per nursing   Code Status:  Full code DVT Prophylaxis:  Lovenox Family Communication:   Disposition Plan: Needs skilled nursing facility, difficult placement due to underlying psychiatry illness.    Time Spent in minutes 35 minutes  Procedures:  None  Consultants:   Psychiatry  Antimicrobials:   Anti-infectives (From admission, onward)   Start     Dose/Rate Route Frequency Ordered Stop   10/26/19 1000  remdesivir 100 mg in sodium chloride 0.9 % 100 mL IVPB  Status:  Discontinued     100 mg 200 mL/hr over 30 Minutes Intravenous Daily 10/25/19 2229 10/25/19 2254   10/26/19 1000  remdesivir 100 mg in sodium chloride 0.9 % 100 mL IVPB     100 mg 200 mL/hr over 30 Minutes Intravenous Daily 10/25/19 2135 10/29/19 1045   10/25/19 2230  cefTRIAXone (ROCEPHIN) 1 g in sodium chloride 0.9 % 100 mL IVPB  Status:  Discontinued     1 g 200 mL/hr over 30 Minutes Intravenous Every 24 hours 10/25/19 2228 10/30/19 1746   10/25/19 2230  doxycycline (VIBRA-TABS) tablet 100 mg  Status:  Discontinued     100 mg Oral Every 12 hours 10/25/19 2229 10/30/19 1746   10/25/19 2228  remdesivir 200 mg in sodium chloride 0.9% 250 mL IVPB  Status:  Discontinued     200 mg 580 mL/hr over 30 Minutes Intravenous Once 10/25/19 2229 10/25/19 2254   10/25/19 2200  remdesivir 200 mg in sodium chloride 0.9% 250 mL IVPB     200 mg 580 mL/hr over 30 Minutes Intravenous Once 10/25/19 2135 10/26/19 0256          Medications  Scheduled Meds: . aspirin EC  81 mg Oral Daily  . atorvastatin  20 mg Oral q1800  . benztropine  0.5 mg Oral BID  . Chlorhexidine Gluconate Cloth  6 each Topical Daily  . chlorproMAZINE  100 mg Oral QHS  . chlorproMAZINE  50 mg Oral TID  . cholecalciferol  5,000 Units Oral Daily  . clotrimazole   Topical BID  . cloZAPine  150 mg Oral BID  . docusate sodium  100 mg Oral Daily  . enoxaparin (LOVENOX) injection  80 mg Subcutaneous QHS  . escitalopram  10 mg Oral QAC breakfast  . feeding supplement (ENSURE ENLIVE)   237 mL Oral BID BM  . fluticasone  1 puff Inhalation BID  . folic acid  1 mg Oral Daily  . Ipratropium-Albuterol  1 puff Inhalation TID  . lamoTRIgine  50 mg Oral TID  . levothyroxine  100 mcg Oral Daily  . metoprolol tartrate  12.5 mg Oral BID  . multivitamin with minerals  1 tablet Oral Daily  . nystatin  1 Bottle Topical Daily  . polyethylene glycol  17 g Oral Daily  . senna  1 tablet Oral Daily  . thiamine  100 mg Oral Daily  . vitamin C  500 mg Oral Daily  . zinc sulfate  220 mg Oral Daily  Continuous Infusions: PRN Meds:.acetaminophen, Ipratropium-Albuterol, LORazepam      Subjective:   Jag Lenz was seen and examined today.  Somnolent but arousable.  Earlier this morning was agitated and restless prior to my encounter.  Sitter at bedside, difficult to obtain review of system from the patient.  Afebrile.  No active nausea or vomiting or diarrhea.  Objective:   Vitals:   11/07/19 1256 11/07/19 2014 11/08/19 0506 11/08/19 1234  BP: (!) 108/59 (!) 123/58 117/70 (!) 118/93  Pulse: 89 86 87 (!) 101  Resp: 16 20 20 18   Temp: 98.1 F (36.7 C) 98.4 F (36.9 C) 98.1 F (36.7 C)   TempSrc: Oral Oral Oral   SpO2: 98% 96% 94% 97%  Weight:      Height:        Intake/Output Summary (Last 24 hours) at 11/08/2019 1302 Last data filed at 11/08/2019 1144 Gross per 24 hour  Intake 470 ml  Output 800 ml  Net -330 ml     Wt Readings from Last 3 Encounters:  11/07/19 113.4 kg  11/06/18 124.7 kg  10/03/18 122.7 kg     Exam  General: Somnolent but arousable, does not follow verbal commands  Eyes:  HEENT:  Atraumatic, normocephalic,  Cardiovascular: S1 S2 auscultated, no murmurs, RRR  Respiratory: Diminished breath sound at the bases  Gastrointestinal: Soft, nontender, nondistended, + bowel sounds  Ext: no pedal edema bilaterally  Neuro: Unable to assess  Musculoskeletal: No digital cyanosis, clubbing  Skin: Blanching pruritic lesions on the lower  extremities, flank and groin  Psych:    Data Reviewed:  I have personally reviewed following labs and imaging studies  Micro Results No results found for this or any previous visit (from the past 240 hour(s)).  Radiology Reports US SCROTUM  Result Date: 11/05/2019 CLINICAL DATA:  Testicle pain EXAM: SCROTAL ULTRASOUND DOPPLER ULTRASOUND OF THE TESTICLES TECHNIQUE: Complete ultrasound examination of the testicles, epididymis, and other scrotal structures was performed. Color and spectral Doppler ultrasound were also utilized to evaluate blood flow to the testicles. COMPARISON:  None. FINDINGS: Right testicle Measurements: 3.8 x 2 x 2.9 cm. No mass or microlithiasis visualized. Left testicle Measurements:  3 x 2.2 x 2.7 cm.  No mass or microlithiasis. Right epididymis:  Normal in size and appearance. Left epididymis:  Normal in size and appearance. Hydrocele:  There are small bilateral hydroceles. Varicocele:  None visualized. Pulsed Doppler interrogation of both testes demonstrates normal low resistance arterial and venous waveforms bilaterally. IMPRESSION: No acute abnormality detected. Electronically Signed   By: Constance Holster M.D.   On: 11/05/2019 18:49   US RENAL  Result Date: 10/26/2019 CLINICAL DATA:  Acute kidney injury, history CHF, CLL, hypertension, COPD, non-Hodgkin's lymphoma EXAM: RENAL / URINARY TRACT ULTRASOUND COMPLETE COMPARISON:  CT abdomen and pelvis 07/17/2018 FINDINGS: Right Kidney: Renal measurements: 10.2 x 4.6 x 4.9 cm = volume: 120 mL. Cortical thinning. Upper normal cortical echogenicity. No mass or hydronephrosis. Suspected 5 mm nonobstructing calculus; no RIGHT renal calculi were seen on the prior CT exam from 2019. Left Kidney: Renal measurements: 12.3 x 5.2 x 4.5 cm = volume: 150 mL. Cortical thinning. Upper normal cortical echogenicity. No mass, hydronephrosis or shadowing calculi. Bladder: Question mildly trabeculated bladder wall.  No focal mass. Other: None.  IMPRESSION: BILATERAL renal cortical atrophy. No evidence of renal mass or hydronephrosis. Question 5 mm nonobstructing central RIGHT renal calculus. Electronically Signed   By: Lavonia Dana M.D.   On: 10/26/2019 16:45   DG  Chest Port 1 View  Result Date: 10/25/2019 CLINICAL DATA:  Altered level of consciousness. EXAM: PORTABLE CHEST 1 VIEW COMPARISON:  12/26/2018 FINDINGS: Normal heart size. No pleural effusion or edema identified. Bilateral airspace opacities are identified involving both mid and lower lung zones. IMPRESSION: 1. Bilateral airspace opacities are identified concerning for multifocal infection. Electronically Signed   By: Kerby Moors M.D.   On: 10/25/2019 19:25    Lab Data:  CBC: Recent Labs  Lab 11/02/19 0303 11/03/19 0246 11/04/19 0245 11/05/19 0252 11/07/19 0326  WBC 21.4* 18.0* 18.6* 17.2* 14.3*  NEUTROABS 16.5* 14.8* 14.4* 12.0* 11.3*  HGB 11.6* 11.9* 11.8* 12.5* 12.7*  HCT 36.8* 37.7* 36.1* 39.5 38.9*  MCV 97.4 98.4 96.3 99.2 96.0  PLT 248 238 221 208 270   Basic Metabolic Panel: Recent Labs  Lab 11/02/19 0303 11/03/19 0246 11/04/19 0245 11/06/19 0257 11/07/19 0326  NA 140 136 140 141 141  K 5.8* 5.2* 4.9 4.9 4.6  CL 105 101 102 104 104  CO2 25 24 27 31 28   GLUCOSE 123* 189* 146* 103* 115*  BUN 53* 54* 58* 47* 38*  CREATININE 2.02* 2.03* 1.97* 1.89* 1.83*  CALCIUM 9.7 9.2 9.8 9.4 9.6   GFR: Estimated Creatinine Clearance: 51.6 mL/min (A) (by C-G formula based on SCr of 1.83 mg/dL (H)). Liver Function Tests: No results for input(s): AST, ALT, ALKPHOS, BILITOT, PROT, ALBUMIN in the last 168 hours. No results for input(s): LIPASE, AMYLASE in the last 168 hours. No results for input(s): AMMONIA in the last 168 hours. Coagulation Profile: No results for input(s): INR, PROTIME in the last 168 hours. Cardiac Enzymes: No results for input(s): CKTOTAL, CKMB, CKMBINDEX, TROPONINI in the last 168 hours. BNP (last 3 results) No results for input(s):  PROBNP in the last 8760 hours. HbA1C: No results for input(s): HGBA1C in the last 72 hours. CBG: No results for input(s): GLUCAP in the last 168 hours. Lipid Profile: No results for input(s): CHOL, HDL, LDLCALC, TRIG, CHOLHDL, LDLDIRECT in the last 72 hours. Thyroid Function Tests: No results for input(s): TSH, T4TOTAL, FREET4, T3FREE, THYROIDAB in the last 72 hours. Anemia Panel: No results for input(s): VITAMINB12, FOLATE, FERRITIN, TIBC, IRON, RETICCTPCT in the last 72 hours. Urine analysis:    Component Value Date/Time   COLORURINE YELLOW 10/25/2019 2357   APPEARANCEUR CLEAR 10/25/2019 2357   LABSPEC 1.019 10/25/2019 2357   PHURINE 5.0 10/25/2019 2357   GLUCOSEU NEGATIVE 10/25/2019 2357   HGBUR NEGATIVE 10/25/2019 2357   BILIRUBINUR NEGATIVE 10/25/2019 [place] 10/25/2019 2357   PROTEINUR NEGATIVE 10/25/2019 2357   UROBILINOGEN 0.2 05/09/2015 1740   NITRITE NEGATIVE 10/25/2019 2357   LEUKOCYTESUR NEGATIVE 10/25/2019 2357     Kaleiyah Polsky M.D. [facility] 11/08/2019, 1:02 PM   Call night coverage person covering after 7pm

## 2019-11-08 NOTE — Progress Notes (Signed)
Update provided to patients legal guardian [name],[phone] regarding plan of care.

## 2019-11-08 NOTE — Progress Notes (Signed)
Physical Therapy Treatment Patient Details Name: [name] MRN: [id number] DOB: Apr 12, 1951 Today's Date: 11/08/2019    History of Present Illness 68 yo male admitted with COVID (+). Hx of schizoaffective d/o, NHL, CKD, obesity, CHF, COPD. Pt was admitted from a group home.    PT Comments    Pt in bed difficult to arouse.  Safety sitter in room.  General Comments: groggy/sleepy.  Staff reports pt was up all night. Assisted OOB.  General bed mobility comments: Pt in bed difficult to arouse.  AxO x 1 and able to follow simple, functional commands.  Assist pt to EOB.  Very sleepy/groggy state.General transfer comment: 75% VC's and 75% tactile cueing to assist pt OOB and assist with toilet transfer.General Gait Details: VERY unsteady, drunken, sloppy gait from the bed to the bathroom and back.  Assisted back to bed.    Follow Up Recommendations  SNF     Equipment Recommendations  None recommended by PT    Recommendations for Other Services       Precautions / Restrictions Precautions Precautions: Fall Precaution Comments: Schizophrenia resides in a Group Home Restrictions Weight Bearing Restrictions: No    Mobility  Bed Mobility Overal bed mobility: Needs Assistance Bed Mobility: Supine to Sit;Sit to Supine     Supine to sit: Max assist Sit to supine: Max assist   General bed mobility comments: Pt in bed difficult to arouse.  AxO x 1 and able to follow simple, functional commands.  Assist pt to EOB.  Very sleepy/groggy state.  Transfers Overall transfer level: Needs assistance Equipment used: Rolling walker (2 wheeled) Transfers: Sit to/from Stand Sit to Stand: Mod assist;Max assist Stand pivot transfers: Max assist       General transfer comment: 75% VC's and 75% tactile cueing to assist pt OOB and assist with toilet transfer.  Ambulation/Gait Ambulation/Gait assistance: Mod assist;Max assist Gait Distance (Feet): 14 Feet(7 feet x 2 to and from  bathroom) Assistive device: Rolling walker (2 wheeled) Gait Pattern/deviations: Step-through pattern;Decreased stride length;Trunk flexed Gait velocity: decreased   General Gait Details: VERY unsteady, drunken, sloppy gait from the bed to the bathroom and back.   Stairs             Wheelchair Mobility    Modified Rankin (Stroke Patients Only)       Balance                                            Cognition Arousal/Alertness: Lethargic                                     General Comments: groggy/sleepy.  Staff reports pt was up all night.      Exercises      General Comments        Pertinent Vitals/Pain      Home Living                      Prior Function            PT Goals (current goals can now be found in the care plan section) Progress towards PT goals: Progressing toward goals    Frequency    Min 3X/week      PT Plan Current plan remains appropriate  Co-evaluation              AM-PAC PT "6 Clicks" Mobility   Outcome Measure  Help needed turning from your back to your side while in a flat bed without using bedrails?: A Lot Help needed moving from lying on your back to sitting on the side of a flat bed without using bedrails?: A Lot Help needed moving to and from a bed to a chair (including a wheelchair)?: A Lot Help needed standing up from a chair using your arms (e.g., wheelchair or bedside chair)?: A Lot Help needed to walk in hospital room?: A Lot Help needed climbing 3-5 steps with a railing? : Total 6 Click Score: 11    End of Session Equipment Utilized During Treatment: Gait belt Activity Tolerance: Patient limited by fatigue;Other (comment)(groggy) Patient left: in bed;with call bell/phone within reach;with bed alarm set;with nursing/sitter in room Nurse Communication: Mobility status PT Visit Diagnosis: Unsteadiness on feet (R26.81);Difficulty in walking, not elsewhere  classified (R26.2);Muscle weakness (generalized) (M62.81)     Time: 0301-4996 PT Time Calculation (min) (ACUTE ONLY): 26 min  Charges:  $Gait Training: 8-22 mins $Therapeutic Activity: 8-22 mins                     Rica Koyanagi  PTA Acute  Rehabilitation Services Pager      [phone] Office      [phone]

## 2019-11-08 NOTE — Progress Notes (Signed)
Initial dose Ativan 1 mg wasted due to IV catheter not being intact. Medication was infused through line however patient did not receive. Upon further IV assessment dressing was saturated. Site was covered with an excess of tape making visibility of site poor. Dressing was pulled away and IV catheter intact and outside of vein. Dose wasted with Despina Arias RN. Additional dose pulled and administered through site right hand 22 G.

## 2019-11-08 NOTE — Progress Notes (Signed)
Pt. Is restless, agitated and continued to be a fall risk .Pt also uses vulgar language a lot. PRN medication is ineffective. Will continue to monitor.

## 2019-11-08 NOTE — Progress Notes (Signed)
OT Cancellation Note  Patient Details Name: [name] MRN: [id number] DOB: 02-05-1951   Cancelled Treatment:    Reason Eval/Treat Not Completed: Other (comment)  Pt was with PT when OT checked on him then was resting.  Will check on next day  Kari Baars, [place] [phone] Office- [phone], Thereasa Parkin 11/08/2019, 2:39 PM

## 2019-11-09 MED ORDER — LEVOTHYROXINE SODIUM 100 MCG PO TABS
100.0000 ug | ORAL_TABLET | Freq: Every day | ORAL | Status: DC
  Administered 2019-11-10: 100 ug via ORAL
  Filled 2019-11-09: qty 1

## 2019-11-09 NOTE — Progress Notes (Signed)
Patient only had 100cc of dark urine output in foley, foley did have some leakage this afternoon. Patient has had very poor PO intake all day. MD notified. Will continue to monitor.

## 2019-11-09 NOTE — Consult Note (Addendum)
  Attempted to complete psychiatry consult with patient.  Patient unable to be aroused by both RN and sitter.  Unable to complete psychiatry assessment at this time.  MD made aware, will attempt assessment again tomorrow.  Attest to NP Note

## 2019-11-09 NOTE — Progress Notes (Signed)
[facility]                                                                              Patient Demographics  [name], is a 68 y.o. male, DOB - Sep 13, 1951, TFT:[id number]  Admit date - 10/25/2019   Admitting Physician Bonnell Public, MD  Outpatient Primary MD for the patient is [name], [place]  Outpatient specialists:   LOS - 15  days   Medical records reviewed and are as summarized below:    No chief complaint on file.      Brief summary   Patient is a 68 year old male, obese, with PMH significant for paranoid schizophrenia, prolonged QT interval, non-Hodgkin's lymphoma, hypertension, hyperlipidemia, COPD, diastolic congestive heart failure and chronic kidney disease.  Patient lives in a group home.   12/9, patient was sent to the ED for worsening shortness of breath, fever chills and low O2 sat at 89%.  In the ED, patient was started on O2 by nasal cannula.   Work-up showed elevated creatinine to 3.09 (up from 1.67).  COVID-19 PCR was positive.   Chest x-ray revealed multifocal pneumonia.  Patient was admitted to hospital service for further evaluation and management. Was initially hospitalized for Covid pneumonia for which he completed the treatment.  His hospital course was complicated by AKI, acute urinary retention. He also currently seems to have some drug reaction versus fungal reaction in the groin, left axilla and left flank   Assessment & Plan    Principal problem  1. Acute Hypoxic Resp. Failure due to Acute Covid 19 Viral Pneumonia during the ongoing 2020 Covid 19 Pandemic - POA - Patient was hypoxic at the time of admission with O2 sats at 89%.  Presented with fevers chills and shortness of breath. -COVID-19 test was positive, chest x-ray showed multifocal pneumonia.  Inflammatory markers were elevated.  Procalcitonin negative. -Currently O2 sats 97% on room air.  Completed 5-day course of remdesivir on 12/13 and 10-day course  of Decadron on 12/18. -Completed course of IV Rocephin and doxycycline.   Acute kidney injury on CKD stage IV -Presented with creatinine of 3.09, up from 1.67. -Meloxicam PTA was placed on hold.   -Renal ultrasound unremarkable with no hydronephrosis or obstruction. -Creatinine slowly improving, currently plateaued at two 1.8  Acute renal urinary retention - -Patient had a Foley catheter placed on admission for urine retention.   -12/16, catheter was removed.  Patient passed voiding trial but he had retention again the next day.  -12/17, a coud catheter was inserted. Needs voiding trial tried again in the next 1 to 2 weeks. -12/20 - no pain in the scrotum.  Ultrasound scrotum normal.  Drug reaction/fungal infection -Currently on lower extremities, left flank, axilla, groin area -Not on any antibiotics which were started during hospitalization -Started on nystatin topical   Intermittent hallucinations/agitation, history of schizoaffective disorder -Continue multiple psych meds including clozapine, benztropine, Thorazine, Lamictal, Lexapro -Currently confused, rambling, trying to pull Foley catheter or IV lines.  Sitter at the bedside, placed on bilateral wrist restraints -Psychiatry reconsulted to evaluate, either very drowsy or is agitated  Hyperkalemia Resolved  QTC prolongation -  QTC 478 on EKG 12/20 -Previous hospitalist spoke to psychiatrist, Dr. [name], recommended to continue previous psych medications that he has been chronically on for over 30 years.  Advised against changing or stopping the regimen. -Keep potassium> 4, magnesium> 2   Chronic diastolic CHF -Compensated, 2D echo in 09/2018 showed EF of 65 to 78% with LV diastolic dysfunction  Hyperlipidemia Continue Lipitor  Hypothyroidism Continue Synthroid  Morbid obesity  BMI 33.9, patient advised diet and weight control  pressure injury Stage II, left buttocks, present on admission -Wound care per  nursing   Code Status: Full code DVT Prophylaxis:  Lovenox Family Communication:   Disposition Plan: Needs skilled nursing facility, difficult placement due to underlying psychiatry illness.    Time Spent in minutes 25 minutes  Procedures:  None  Consultants:   Psychiatry  Antimicrobials:   Anti-infectives (From admission, onward)   Start     Dose/Rate Route Frequency Ordered Stop   10/26/19 1000  remdesivir 100 mg in sodium chloride 0.9 % 100 mL IVPB  Status:  Discontinued     100 mg 200 mL/hr over 30 Minutes Intravenous Daily 10/25/19 2229 10/25/19 2254   10/26/19 1000  remdesivir 100 mg in sodium chloride 0.9 % 100 mL IVPB     100 mg 200 mL/hr over 30 Minutes Intravenous Daily 10/25/19 2135 10/29/19 1045   10/25/19 2230  cefTRIAXone (ROCEPHIN) 1 g in sodium chloride 0.9 % 100 mL IVPB  Status:  Discontinued     1 g 200 mL/hr over 30 Minutes Intravenous Every 24 hours 10/25/19 2228 10/30/19 1746   10/25/19 2230  doxycycline (VIBRA-TABS) tablet 100 mg  Status:  Discontinued     100 mg Oral Every 12 hours 10/25/19 2229 10/30/19 1746   10/25/19 2228  remdesivir 200 mg in sodium chloride 0.9% 250 mL IVPB  Status:  Discontinued     200 mg 580 mL/hr over 30 Minutes Intravenous Once 10/25/19 2229 10/25/19 2254   10/25/19 2200  remdesivir 200 mg in sodium chloride 0.9% 250 mL IVPB     200 mg 580 mL/hr over 30 Minutes Intravenous Once 10/25/19 2135 10/26/19 0256         Medications  Scheduled Meds: . aspirin EC  81 mg Oral Daily  . atorvastatin  20 mg Oral q1800  . benztropine  0.5 mg Oral BID  . Chlorhexidine Gluconate Cloth  6 each Topical Daily  . chlorproMAZINE  100 mg Oral QHS  . chlorproMAZINE  50 mg Oral TID  . cholecalciferol  5,000 Units Oral Daily  . clotrimazole   Topical BID  . cloZAPine  150 mg Oral BID  . docusate sodium  100 mg Oral Daily  . enoxaparin (LOVENOX) injection  55 mg Subcutaneous QHS  . escitalopram  10 mg Oral QAC breakfast  . feeding  supplement (ENSURE ENLIVE)  237 mL Oral BID BM  . fluticasone  1 puff Inhalation BID  . folic acid  1 mg Oral Daily  . Ipratropium-Albuterol  1 puff Inhalation TID  . lamoTRIgine  50 mg Oral TID  . [START ON 11/10/2019] levothyroxine  100 mcg Oral Q0600  . metoprolol tartrate  12.5 mg Oral BID  . multivitamin with minerals  1 tablet Oral Daily  . nystatin  1 Bottle Topical Daily  . polyethylene glycol  17 g Oral Daily  . senna  1 tablet Oral Daily  . thiamine  100 mg Oral Daily  . vitamin C  500 mg Oral Daily  .  zinc sulfate  220 mg Oral Daily   Continuous Infusions: PRN Meds:.acetaminophen, Ipratropium-Albuterol, LORazepam      Subjective:   [name] was seen and examined today.  At the time of my examination, confused, restless, had tried to pull out his Foley catheter.  Sitter at the bedside.  Difficult obtain review of system from the patient.  No acute respiratory distress, vomiting or diarrhea.  No fevers.   Objective:   Vitals:   11/08/19 1234 11/08/19 1324 11/08/19 1332 11/09/19 0351  BP: (!) 118/93  110/62 107/72  Pulse: (!) 101  89 80  Resp: 18  16 19   Temp:   97.8 F (36.6 C) 98.5 F (36.9 C)  TempSrc:   Oral Oral  SpO2: 97%  96% 97%  Weight:  113 kg    Height:        Intake/Output Summary (Last 24 hours) at 11/09/2019 1319 Last data filed at 11/09/2019 0600 Gross per 24 hour  Intake 530 ml  Output 650 ml  Net -120 ml     Wt Readings from Last 3 Encounters:  11/08/19 113 kg  11/06/18 124.7 kg  10/03/18 122.7 kg    Physical Exam  General: Intermittent agitation and drowsiness, does not follow verbal commands  Eyes:   HEENT:  Atraumatic, normocephalic  Cardiovascular: S1 S2 clear,  RRR. No pedal edema b/l  Respiratory: CTAB, no wheezing, rales or rhonchi  Gastrointestinal: Soft, nontender, nondistended, NBS  Ext: no pedal edema bilaterally  Neuro: Moving all 4 extremities, restless, unable to assess  Musculoskeletal: No  cyanosis, clubbing  Skin: No rashes  Psych: Confused GU: Foley catheter   Data Reviewed:  I have personally reviewed following labs and imaging studies  Micro Results No results found for this or any previous visit (from the past 240 hour(s)).  Radiology Reports US SCROTUM  Result Date: 11/05/2019 CLINICAL DATA:  Testicle pain EXAM: SCROTAL ULTRASOUND DOPPLER ULTRASOUND OF THE TESTICLES TECHNIQUE: Complete ultrasound examination of the testicles, epididymis, and other scrotal structures was performed. Color and spectral Doppler ultrasound were also utilized to evaluate blood flow to the testicles. COMPARISON:  None. FINDINGS: Right testicle Measurements: 3.8 x 2 x 2.9 cm. No mass or microlithiasis visualized. Left testicle Measurements:  3 x 2.2 x 2.7 cm.  No mass or microlithiasis. Right epididymis:  Normal in size and appearance. Left epididymis:  Normal in size and appearance. Hydrocele:  There are small bilateral hydroceles. Varicocele:  None visualized. Pulsed Doppler interrogation of both testes demonstrates normal low resistance arterial and venous waveforms bilaterally. IMPRESSION: No acute abnormality detected. Electronically Signed   By: Constance Holster M.D.   On: 11/05/2019 18:49   US RENAL  Result Date: 10/26/2019 CLINICAL DATA:  Acute kidney injury, history CHF, CLL, hypertension, COPD, non-Hodgkin's lymphoma EXAM: RENAL / URINARY TRACT ULTRASOUND COMPLETE COMPARISON:  CT abdomen and pelvis 07/17/2018 FINDINGS: Right Kidney: Renal measurements: 10.2 x 4.6 x 4.9 cm = volume: 120 mL. Cortical thinning. Upper normal cortical echogenicity. No mass or hydronephrosis. Suspected 5 mm nonobstructing calculus; no RIGHT renal calculi were seen on the prior CT exam from 2019. Left Kidney: Renal measurements: 12.3 x 5.2 x 4.5 cm = volume: 150 mL. Cortical thinning. Upper normal cortical echogenicity. No mass, hydronephrosis or shadowing calculi. Bladder: Question mildly trabeculated bladder  wall.  No focal mass. Other: None. IMPRESSION: BILATERAL renal cortical atrophy. No evidence of renal mass or hydronephrosis. Question 5 mm nonobstructing central RIGHT renal calculus. Electronically Signed   By: Elta Guadeloupe  Thornton Papas M.D.   On: 10/26/2019 16:45   DG Chest Port 1 View  Result Date: 10/25/2019 CLINICAL DATA:  Altered level of consciousness. EXAM: PORTABLE CHEST 1 VIEW COMPARISON:  12/26/2018 FINDINGS: Normal heart size. No pleural effusion or edema identified. Bilateral airspace opacities are identified involving both mid and lower lung zones. IMPRESSION: 1. Bilateral airspace opacities are identified concerning for multifocal infection. Electronically Signed   By: Kerby Moors M.D.   On: 10/25/2019 19:25    Lab Data:  CBC: Recent Labs  Lab 11/03/19 0246 11/04/19 0245 11/05/19 0252 11/07/19 0326  WBC 18.0* 18.6* 17.2* 14.3*  NEUTROABS 14.8* 14.4* 12.0* 11.3*  HGB 11.9* 11.8* 12.5* 12.7*  HCT 37.7* 36.1* 39.5 38.9*  MCV 98.4 96.3 99.2 96.0  PLT 238 221 208 741   Basic Metabolic Panel: Recent Labs  Lab 11/03/19 0246 11/04/19 0245 11/06/19 0257 11/07/19 0326  NA 136 140 141 141  K 5.2* 4.9 4.9 4.6  CL 101 102 104 104  CO2 24 27 31 28   GLUCOSE 189* 146* 103* 115*  BUN 54* 58* 47* 38*  CREATININE 2.03* 1.97* 1.89* 1.83*  CALCIUM 9.2 9.8 9.4 9.6   GFR: Estimated Creatinine Clearance: 51.6 mL/min (A) (by C-G formula based on SCr of 1.83 mg/dL (H)). Liver Function Tests: No results for input(s): AST, ALT, ALKPHOS, BILITOT, PROT, ALBUMIN in the last 168 hours. No results for input(s): LIPASE, AMYLASE in the last 168 hours. No results for input(s): AMMONIA in the last 168 hours. Coagulation Profile: No results for input(s): INR, PROTIME in the last 168 hours. Cardiac Enzymes: No results for input(s): CKTOTAL, CKMB, CKMBINDEX, TROPONINI in the last 168 hours. BNP (last 3 results) No results for input(s): PROBNP in the last 8760 hours. HbA1C: No results for input(s):  HGBA1C in the last 72 hours. CBG: No results for input(s): GLUCAP in the last 168 hours. Lipid Profile: No results for input(s): CHOL, HDL, LDLCALC, TRIG, CHOLHDL, LDLDIRECT in the last 72 hours. Thyroid Function Tests: No results for input(s): TSH, T4TOTAL, FREET4, T3FREE, THYROIDAB in the last 72 hours. Anemia Panel: No results for input(s): VITAMINB12, FOLATE, FERRITIN, TIBC, IRON, RETICCTPCT in the last 72 hours. Urine analysis:    Component Value Date/Time   COLORURINE YELLOW 10/25/2019 2357   APPEARANCEUR CLEAR 10/25/2019 2357   LABSPEC 1.019 10/25/2019 2357   PHURINE 5.0 10/25/2019 2357   GLUCOSEU NEGATIVE 10/25/2019 2357   HGBUR NEGATIVE 10/25/2019 2357   BILIRUBINUR NEGATIVE 10/25/2019 [place] 10/25/2019 2357   PROTEINUR NEGATIVE 10/25/2019 2357   UROBILINOGEN 0.2 05/09/2015 1740   NITRITE NEGATIVE 10/25/2019 2357   LEUKOCYTESUR NEGATIVE 10/25/2019 2357     Baer Hinton M.D. [facility] 11/09/2019, 1:19 PM   Call night coverage person covering after 7pm

## 2019-11-10 ENCOUNTER — Inpatient Hospital Stay (HOSPITAL_COMMUNITY): Payer: Medicare Other

## 2019-11-10 LAB — CBC
HCT: 38.4 % — ABNORMAL LOW (ref 39.0–52.0)
Hemoglobin: 11.8 g/dL — ABNORMAL LOW (ref 13.0–17.0)
MCH: 31.5 pg (ref 26.0–34.0)
MCHC: 30.7 g/dL (ref 30.0–36.0)
MCV: 102.4 fL — ABNORMAL HIGH (ref 80.0–100.0)
Platelets: 148 10*3/uL — ABNORMAL LOW (ref 150–400)
RBC: 3.75 MIL/uL — ABNORMAL LOW (ref 4.22–5.81)
RDW: 15.2 % (ref 11.5–15.5)
WBC: 47.2 10*3/uL — ABNORMAL HIGH (ref 4.0–10.5)
nRBC: 0 % (ref 0.0–0.2)

## 2019-11-10 LAB — TSH: TSH: 1.038 u[IU]/mL (ref 0.350–4.500)

## 2019-11-10 LAB — COMPREHENSIVE METABOLIC PANEL
ALT: 33 U/L (ref 0–44)
ALT: 28 U/L (ref 0–44)
AST: 96 U/L — ABNORMAL HIGH (ref 15–41)
AST: 71 U/L — ABNORMAL HIGH (ref 15–41)
Albumin: 3.1 g/dL — ABNORMAL LOW (ref 3.5–5.0)
Albumin: 2.9 g/dL — ABNORMAL LOW (ref 3.5–5.0)
Alkaline Phosphatase: 74 U/L (ref 38–126)
Alkaline Phosphatase: 73 U/L (ref 38–126)
Anion gap: 12 (ref 5–15)
Anion gap: 12 (ref 5–15)
BUN: 70 mg/dL — ABNORMAL HIGH (ref 8–23)
BUN: 67 mg/dL — ABNORMAL HIGH (ref 8–23)
CO2: 26 mmol/L (ref 22–32)
CO2: 24 mmol/L (ref 22–32)
Calcium: 9.3 mg/dL (ref 8.9–10.3)
Calcium: 9.1 mg/dL (ref 8.9–10.3)
Chloride: 108 mmol/L (ref 98–111)
Chloride: 109 mmol/L (ref 98–111)
Creatinine, Ser: 4.28 mg/dL — ABNORMAL HIGH (ref 0.61–1.24)
Creatinine, Ser: 4.34 mg/dL — ABNORMAL HIGH (ref 0.61–1.24)
GFR calc Af Amer: 16 mL/min — ABNORMAL LOW (ref >60)
GFR calc Af Amer: 15 mL/min — ABNORMAL LOW (ref >60)
GFR calc non Af Amer: 13 mL/min — ABNORMAL LOW (ref >60)
GFR calc non Af Amer: 13 mL/min — ABNORMAL LOW (ref >60)
Glucose, Bld: 214 mg/dL — ABNORMAL HIGH (ref 70–99)
Glucose, Bld: 190 mg/dL — ABNORMAL HIGH (ref 70–99)
Potassium: 4.7 mmol/L (ref 3.5–5.1)
Potassium: 4.7 mmol/L (ref 3.5–5.1)
Sodium: 146 mmol/L — ABNORMAL HIGH (ref 135–145)
Sodium: 145 mmol/L (ref 135–145)
Total Bilirubin: 1 mg/dL (ref 0.3–1.2)
Total Bilirubin: 0.6 mg/dL (ref 0.3–1.2)
Total Protein: 6 g/dL — ABNORMAL LOW (ref 6.5–8.1)
Total Protein: 5.9 g/dL — ABNORMAL LOW (ref 6.5–8.1)

## 2019-11-10 LAB — CBC WITH DIFFERENTIAL/PLATELET
Abs Immature Granulocytes: 2.9 10*3/uL — ABNORMAL HIGH (ref 0.00–0.07)
Basophils Absolute: 0.1 10*3/uL (ref 0.0–0.1)
Basophils Relative: 0 %
Eosinophils Absolute: 0 10*3/uL (ref 0.0–0.5)
Eosinophils Relative: 0 %
HCT: 38.2 % — ABNORMAL LOW (ref 39.0–52.0)
Hemoglobin: 11.6 g/dL — ABNORMAL LOW (ref 13.0–17.0)
Immature Granulocytes: 7 %
Lymphocytes Relative: 3 %
Lymphs Abs: 1.1 10*3/uL (ref 0.7–4.0)
MCH: 32 pg (ref 26.0–34.0)
MCHC: 30.4 g/dL (ref 30.0–36.0)
MCV: 105.5 fL — ABNORMAL HIGH (ref 80.0–100.0)
Monocytes Absolute: 2.2 10*3/uL — ABNORMAL HIGH (ref 0.1–1.0)
Monocytes Relative: 5 %
Neutro Abs: 36.9 10*3/uL — ABNORMAL HIGH (ref 1.7–7.7)
Neutrophils Relative %: 85 %
Platelets: 137 10*3/uL — ABNORMAL LOW (ref 150–400)
RBC: 3.62 MIL/uL — ABNORMAL LOW (ref 4.22–5.81)
RDW: 15.2 % (ref 11.5–15.5)
WBC: 43.2 10*3/uL — ABNORMAL HIGH (ref 4.0–10.5)
nRBC: 0 % (ref 0.0–0.2)

## 2019-11-10 LAB — PROCALCITONIN: Procalcitonin: 22.18 ng/mL

## 2019-11-10 LAB — AMMONIA: Ammonia: 25 umol/L (ref 9–35)

## 2019-11-10 LAB — VITAMIN B12: Vitamin B-12: 425 pg/mL (ref 180–914)

## 2019-11-10 LAB — LACTIC ACID, PLASMA: Lactic Acid, Venous: 1.4 mmol/L (ref 0.5–1.9)

## 2019-11-10 LAB — CORTISOL: Cortisol, Plasma: 49.4 ug/dL

## 2019-11-10 MED ORDER — SODIUM CHLORIDE 0.9 % IV BOLUS: 1000.0000 mL | Freq: Once | INTRAVENOUS | Status: DC

## 2019-11-10 MED ORDER — SODIUM CHLORIDE 0.9 % IV SOLN
2.0000 g | Freq: Every day | INTRAVENOUS | Status: DC
  Administered 2019-11-10 – 2019-11-14 (×5): 2 g via INTRAVENOUS
  Filled 2019-11-10 (×5): qty 2

## 2019-11-10 MED ORDER — ENOXAPARIN SODIUM 30 MG/0.3ML ~~LOC~~ SOLN
30.0000 mg | Freq: Every day | SUBCUTANEOUS | Status: DC
  Administered 2019-11-10 – 2019-11-11 (×2): 30 mg via SUBCUTANEOUS
  Filled 2019-11-10 (×2): qty 0.3

## 2019-11-10 MED ORDER — SODIUM CHLORIDE 0.9 % IV BOLUS
1000.0000 mL | Freq: Once | INTRAVENOUS | Status: AC
  Administered 2019-11-10: 1000 mL via INTRAVENOUS

## 2019-11-10 MED ORDER — DEXTROSE-NACL 5-0.45 % IV SOLN: INTRAVENOUS | Status: DC

## 2019-11-10 MED ORDER — CLOZAPINE 100 MG PO TABS
100.0000 mg | ORAL_TABLET | Freq: Every day | ORAL | Status: DC
  Administered 2019-11-11 – 2019-11-15 (×5): 100 mg via ORAL
  Filled 2019-11-10 (×9): qty 1

## 2019-11-10 NOTE — Progress Notes (Addendum)
[facility]                                                                              Patient Demographics  [name], is a 68 y.o. male, DOB - [birth year], XMI:[id number]  Admit date - 10/25/2019   Admitting Physician Bonnell Public, MD  Outpatient Primary MD for the patient is [name], [place]  Outpatient specialists:   LOS - 16  days   Medical records reviewed and are as summarized below:    No chief complaint on file.      Brief summary   Patient is a 68 year old male, obese, with PMH significant for paranoid schizophrenia, prolonged QT interval, non-Hodgkin's lymphoma, hypertension, hyperlipidemia, COPD, diastolic congestive heart failure and chronic kidney disease.  Patient lives in a group home.   12/9, patient was sent to the ED for worsening shortness of breath, fever chills and low O2 sat at 89%.  In the ED, patient was started on O2 by nasal cannula.   Work-up showed elevated creatinine to 3.09 (up from 1.67).  COVID-19 PCR was positive.   Chest x-ray revealed multifocal pneumonia.  Patient was admitted to hospital service for further evaluation and management. Was initially hospitalized for Covid pneumonia for which he completed the treatment.  His hospital course was complicated by AKI, acute urinary retention. He also currently seems to have some drug reaction versus fungal reaction in the groin, left axilla and left flank   Assessment & Plan    Principal problem  1. Acute Hypoxic Resp. Failure due to Acute Covid 19 Viral Pneumonia during the ongoing 2020 Covid 19 Pandemic - POA - Patient was hypoxic at the time of admission with O2 sats at 89%.  Presented with fevers chills and shortness of breath. -COVID-19 test was positive, chest x-ray showed multifocal pneumonia.  Inflammatory markers were elevated.  Procalcitonin negative. -O2 sats 94% on room air - completed 5 days course of remdesivir on 12/13 and 10-day course of  Decadron on 12/18  -Completed course of IV Rocephin and doxycycline.   Acute kidney injury on CKD stage IV -Presented with creatinine of 3.09, up from 1.67. -Meloxicam PTA was placed on hold.   -Renal ultrasound unremarkable with no hydronephrosis or obstruction. -Creatinine plateaued at 1.8, repeat BMET Addendum Patient spiking temp, hypotensive, sepsis. Labs reviewed showed worsened AKI 4.28, WBC 47.2 - will give fluid bolus, obtain blood cultures x2, UA and Cx, CXR and CT abd and pelvis.  Renal US on 12/10 had shown no hydronephrosis but ?64mm nonobstructing central right renal calculus  - placed on IV cefepime. Hold off vanc due to Cr 4.2 Addendum 5:40pm   Repeat BP after bolus still in 80's, will transfer to SDU for sepsis with shock - will give another fluid bolus, continue IVF - repeat labs, check cortisol. May need pressors.    Acute metabolic encephalopathy with intermittent hallucinations/agitation, history of schizoaffective disorder -Continue multiple psych meds including clozapine, benztropine, Thorazine, Lamictal, Lexapro -Very confused, sitter at the bedside, trying to get up from the bed, intermittently drowsy and agitated -Psychiatry reconsulted, needs medication adjustment, on multiple psych medications.  Psychiatry recommended  discontinue Cogentin, Thorazine, Lamictal.  Decrease clozapine to 100 mg nightly, continue Lexapro 10 mg daily and Ativan 1 mg as needed - will check CMET, ammonia level, Lamictal level, clozapine level, B1, B12, copper, ceruloplasmin level, TSH   Hypotension -Poor p.o. intake, hold metoprolol -Placed on D5 half-normal saline at 100 cc/ hour x 2L, then will re assess  Acute renal urinary retention - -Patient had a Foley catheter placed on admission for urine retention.   -12/16, catheter was removed.  Patient passed voiding trial but he had retention again the next day.  -12/17, a coud catheter was inserted. Needs voiding trial tried again in  the next 1 to 2 weeks. -12/20 - no pain in the scrotum.  Ultrasound scrotum normal.  Drug reaction/fungal infection -Currently on lower extremities, left flank, axilla, groin area -Not on any antibiotics which were started during hospitalization -Started on nystatin topical  Hyperkalemia Resolved  QTC prolongation -QTC 478 on EKG 12/20 -Keep potassium> 4, magnesium> 2, check bmet -Repeat EKG  Chronic diastolic CHF -Compensated, 2D echo in 09/2018 showed EF of 65 to 22% with LV diastolic dysfunction  Hyperlipidemia Continue Lipitor  Hypothyroidism Continue Synthroid, follow TSH  Morbid obesity  BMI 33.9, patient advised diet and weight control  pressure injury Stage II, left buttocks, present on admission -Wound care per nursing   Code Status: Full code DVT Prophylaxis:  Lovenox Family Communication:   Disposition Plan: Needs skilled nursing facility, difficult placement due to underlying psychiatry illness.    Time Spent in minutes 25 minutes  Procedures:  None  Consultants:   Psychiatry  Antimicrobials:   Anti-infectives (From admission, onward)   Start     Dose/Rate Route Frequency Ordered Stop   10/26/19 1000  remdesivir 100 mg in sodium chloride 0.9 % 100 mL IVPB  Status:  Discontinued     100 mg 200 mL/hr over 30 Minutes Intravenous Daily 10/25/19 2229 10/25/19 2254   10/26/19 1000  remdesivir 100 mg in sodium chloride 0.9 % 100 mL IVPB     100 mg 200 mL/hr over 30 Minutes Intravenous Daily 10/25/19 2135 10/29/19 1045   10/25/19 2230  cefTRIAXone (ROCEPHIN) 1 g in sodium chloride 0.9 % 100 mL IVPB  Status:  Discontinued     1 g 200 mL/hr over 30 Minutes Intravenous Every 24 hours 10/25/19 2228 10/30/19 1746   10/25/19 2230  doxycycline (VIBRA-TABS) tablet 100 mg  Status:  Discontinued     100 mg Oral Every 12 hours 10/25/19 2229 10/30/19 1746   10/25/19 2228  remdesivir 200 mg in sodium chloride 0.9% 250 mL IVPB  Status:  Discontinued     200  mg 580 mL/hr over 30 Minutes Intravenous Once 10/25/19 2229 10/25/19 2254   10/25/19 2200  remdesivir 200 mg in sodium chloride 0.9% 250 mL IVPB     200 mg 580 mL/hr over 30 Minutes Intravenous Once 10/25/19 2135 10/26/19 0256         Medications  Scheduled Meds:  aspirin EC  81 mg Oral Daily   atorvastatin  20 mg Oral q1800   Chlorhexidine Gluconate Cloth  6 each Topical Daily   cholecalciferol  5,000 Units Oral Daily   clotrimazole   Topical BID   cloZAPine  100 mg Oral QHS   docusate sodium  100 mg Oral Daily   enoxaparin (LOVENOX) injection  55 mg Subcutaneous QHS   escitalopram  10 mg Oral QAC breakfast   feeding supplement (ENSURE ENLIVE)  237 mL Oral  BID BM   fluticasone  1 puff Inhalation BID   folic acid  1 mg Oral Daily   Ipratropium-Albuterol  1 puff Inhalation TID   levothyroxine  100 mcg Oral Q0600   metoprolol tartrate  12.5 mg Oral BID   multivitamin with minerals  1 tablet Oral Daily   nystatin  1 Bottle Topical Daily   polyethylene glycol  17 g Oral Daily   senna  1 tablet Oral Daily   thiamine  100 mg Oral Daily   vitamin C  500 mg Oral Daily   zinc sulfate  220 mg Oral Daily   Continuous Infusions: PRN Meds:.acetaminophen, Ipratropium-Albuterol, LORazepam      Subjective:   [name] was seen and examined today.  Very confused, intermittently agitated, does not respond to any verbal commands.  Although spontaneously moving all 4 extremities, trying to get out of the bed.  Sitter at the bedside. Difficult obtain review of system from the patient.  No fevers or acute respiratory distress.  Objective:   Vitals:   11/09/19 2114 11/09/19 2154 11/10/19 0516 11/10/19 1013  BP: 111/62  (!) 106/53 (!) 110/99  Pulse: (!) 103  (!) 108 (!) 116  Resp: 16  20 16   Temp:  98.2 F (36.8 C) 99.3 F (37.4 C) 99.1 F (37.3 C)  TempSrc:  Oral Oral Oral  SpO2: 95%  94%   Weight:      Height:        Intake/Output Summary (Last 24  hours) at 11/10/2019 1228 Last data filed at 11/10/2019 1030 Gross per 24 hour  Intake 890 ml  Output 200 ml  Net 690 ml     Wt Readings from Last 3 Encounters:  11/08/19 113 kg  11/06/18 124.7 kg  10/03/18 122.7 kg   Physical Exam  General: Intermittently agitated and drowsy, not following any verbal commands  Eyes:  HEENT:  Atraumatic, normocephalic  Cardiovascular: S1 S2 clear, RRR. No pedal edema b/l  Respiratory: CTAB, no wheezing, rales or rhonchi  Gastrointestinal: Soft, nontender, nondistended, NBS  Ext: no pedal edema bilaterally  Neuro: Moving all 4 extremities  Musculoskeletal: No cyanosis, clubbing  Skin: No rashes  Psych: Confused, agitated       Data Reviewed:  I have personally reviewed following labs and imaging studies  Micro Results No results found for this or any previous visit (from the past 240 hour(s)).  Radiology Reports US SCROTUM  Result Date: 11/05/2019 CLINICAL DATA:  Testicle pain EXAM: SCROTAL ULTRASOUND DOPPLER ULTRASOUND OF THE TESTICLES TECHNIQUE: Complete ultrasound examination of the testicles, epididymis, and other scrotal structures was performed. Color and spectral Doppler ultrasound were also utilized to evaluate blood flow to the testicles. COMPARISON:  None. FINDINGS: Right testicle Measurements: 3.8 x 2 x 2.9 cm. No mass or microlithiasis visualized. Left testicle Measurements:  3 x 2.2 x 2.7 cm.  No mass or microlithiasis. Right epididymis:  Normal in size and appearance. Left epididymis:  Normal in size and appearance. Hydrocele:  There are small bilateral hydroceles. Varicocele:  None visualized. Pulsed Doppler interrogation of both testes demonstrates normal low resistance arterial and venous waveforms bilaterally. IMPRESSION: No acute abnormality detected. Electronically Signed   By: Constance Holster M.D.   On: 11/05/2019 18:49   US RENAL  Result Date: 10/26/2019 CLINICAL DATA:  Acute kidney injury, history CHF,  CLL, hypertension, COPD, non-Hodgkin's lymphoma EXAM: RENAL / URINARY TRACT ULTRASOUND COMPLETE COMPARISON:  CT abdomen and pelvis 07/17/2018 FINDINGS: Right Kidney: Renal measurements: 10.2  x 4.6 x 4.9 cm = volume: 120 mL. Cortical thinning. Upper normal cortical echogenicity. No mass or hydronephrosis. Suspected 5 mm nonobstructing calculus; no RIGHT renal calculi were seen on the prior CT exam from 2019. Left Kidney: Renal measurements: 12.3 x 5.2 x 4.5 cm = volume: 150 mL. Cortical thinning. Upper normal cortical echogenicity. No mass, hydronephrosis or shadowing calculi. Bladder: Question mildly trabeculated bladder wall.  No focal mass. Other: None. IMPRESSION: BILATERAL renal cortical atrophy. No evidence of renal mass or hydronephrosis. Question 5 mm nonobstructing central RIGHT renal calculus. Electronically Signed   By: Lavonia Dana M.D.   On: 10/26/2019 16:45   DG Chest Port 1 View  Result Date: 10/25/2019 CLINICAL DATA:  Altered level of consciousness. EXAM: PORTABLE CHEST 1 VIEW COMPARISON:  12/26/2018 FINDINGS: Normal heart size. No pleural effusion or edema identified. Bilateral airspace opacities are identified involving both mid and lower lung zones. IMPRESSION: 1. Bilateral airspace opacities are identified concerning for multifocal infection. Electronically Signed   By: Kerby Moors M.D.   On: 10/25/2019 19:25    Lab Data:  CBC: Recent Labs  Lab 11/04/19 0245 11/05/19 0252 11/07/19 0326  WBC 18.6* 17.2* 14.3*  NEUTROABS 14.4* 12.0* 11.3*  HGB 11.8* 12.5* 12.7*  HCT 36.1* 39.5 38.9*  MCV 96.3 99.2 96.0  PLT 221 208 366   Basic Metabolic Panel: Recent Labs  Lab 11/04/19 0245 11/06/19 0257 11/07/19 0326  NA 140 141 141  K 4.9 4.9 4.6  CL 102 104 104  CO2 27 31 28   GLUCOSE 146* 103* 115*  BUN 58* 47* 38*  CREATININE 1.97* 1.89* 1.83*  CALCIUM 9.8 9.4 9.6   GFR: Estimated Creatinine Clearance: 51.6 mL/min (A) (by C-G formula based on SCr of 1.83 mg/dL (H)). Liver  Function Tests: No results for input(s): AST, ALT, ALKPHOS, BILITOT, PROT, ALBUMIN in the last 168 hours. No results for input(s): LIPASE, AMYLASE in the last 168 hours. No results for input(s): AMMONIA in the last 168 hours. Coagulation Profile: No results for input(s): INR, PROTIME in the last 168 hours. Cardiac Enzymes: No results for input(s): CKTOTAL, CKMB, CKMBINDEX, TROPONINI in the last 168 hours. BNP (last 3 results) No results for input(s): PROBNP in the last 8760 hours. HbA1C: No results for input(s): HGBA1C in the last 72 hours. CBG: No results for input(s): GLUCAP in the last 168 hours. Lipid Profile: No results for input(s): CHOL, HDL, LDLCALC, TRIG, CHOLHDL, LDLDIRECT in the last 72 hours. Thyroid Function Tests: No results for input(s): TSH, T4TOTAL, FREET4, T3FREE, THYROIDAB in the last 72 hours. Anemia Panel: No results for input(s): VITAMINB12, FOLATE, FERRITIN, TIBC, IRON, RETICCTPCT in the last 72 hours. Urine analysis:    Component Value Date/Time   COLORURINE YELLOW 10/25/2019 2357   APPEARANCEUR CLEAR 10/25/2019 2357   LABSPEC 1.019 10/25/2019 2357   PHURINE 5.0 10/25/2019 2357   GLUCOSEU NEGATIVE 10/25/2019 2357   HGBUR NEGATIVE 10/25/2019 2357   BILIRUBINUR NEGATIVE 10/25/2019 [place] 10/25/2019 2357   PROTEINUR NEGATIVE 10/25/2019 2357   UROBILINOGEN 0.2 05/09/2015 1740   NITRITE NEGATIVE 10/25/2019 2357   LEUKOCYTESUR NEGATIVE 10/25/2019 2357     Velva Molinari M.D. [facility] 11/10/2019, 12:28 PM   Call night coverage person covering after 7pm

## 2019-11-10 NOTE — Progress Notes (Signed)
PHARMACY NOTE -  Cefepime  Pharmacy has been assisting with dosing of cefepime for sepsis.  Dosage remains stable at 2g IV q24 hr with current CrCl  Pharmacy will sign off, following peripherally for culture results or dose adjustments. Please reconsult if a change in clinical status warrants re-evaluation of dosage.  Reuel Boom, PharmD, BCPS [phone] 11/10/2019, 4:49 PM

## 2019-11-10 NOTE — Consult Note (Addendum)
  Attempted to complete psychiatry consult with patient.  Patient unable to be aroused by both RN and sitter. Patient appears to [facility], per nurse patient states "help me, help me" patient unable to address needs. Patient does not open eyes, appears disorganized.  Unable to complete psychiatry assessment at this time.  MD made aware, will attempt assessment again tomorrow. Patient not tolerating home medications likely related to current illness and limited intake. Consider medication recommendations: Discontinue cogentin, discontinue thorazine, discontinue Lamictal. Decrease clozapine to 100mg  QHS. Continue lexapro 10mg  and Ativan 1mg  PRN.   Attest to NP Note

## 2019-11-10 NOTE — Plan of Care (Signed)
  Problem: Clinical Measurements: Goal: Diagnostic test results will improve Outcome: Progressing Goal: Respiratory complications will improve Outcome: Progressing   Problem: Activity: Goal: Risk for activity intolerance will decrease Outcome: Progressing   Problem: Nutrition: Goal: Adequate nutrition will be maintained Outcome: Progressing   

## 2019-11-10 NOTE — Plan of Care (Signed)
Case reviewed with nurse practitioner, patient's tolerance for psychotropic polypharmacy is limited given his medical compromise, risks also include QTC prolongation, therefore we believe the current medication burden is worsening rather than ameliorating his mental status changes and will make adjustments as reflected in orders.

## 2019-11-11 ENCOUNTER — Inpatient Hospital Stay (HOSPITAL_COMMUNITY): Payer: Medicare Other

## 2019-11-11 LAB — COMPREHENSIVE METABOLIC PANEL
ALT: 29 U/L (ref 0–44)
AST: 50 U/L — ABNORMAL HIGH (ref 15–41)
Albumin: 2.7 g/dL — ABNORMAL LOW (ref 3.5–5.0)
Alkaline Phosphatase: 121 U/L (ref 38–126)
Anion gap: 9 (ref 5–15)
BUN: 62 mg/dL — ABNORMAL HIGH (ref 8–23)
CO2: 27 mmol/L (ref 22–32)
Calcium: 8.8 mg/dL — ABNORMAL LOW (ref 8.9–10.3)
Chloride: 112 mmol/L — ABNORMAL HIGH (ref 98–111)
Creatinine, Ser: 3.49 mg/dL — ABNORMAL HIGH (ref 0.61–1.24)
GFR calc Af Amer: 20 mL/min — ABNORMAL LOW (ref >60)
GFR calc non Af Amer: 17 mL/min — ABNORMAL LOW (ref >60)
Glucose, Bld: 162 mg/dL — ABNORMAL HIGH (ref 70–99)
Potassium: 4.4 mmol/L (ref 3.5–5.1)
Sodium: 148 mmol/L — ABNORMAL HIGH (ref 135–145)
Total Bilirubin: 0.6 mg/dL (ref 0.3–1.2)
Total Protein: 6 g/dL — ABNORMAL LOW (ref 6.5–8.1)

## 2019-11-11 LAB — AMMONIA: Ammonia: 25 umol/L (ref 9–35)

## 2019-11-11 LAB — URINALYSIS, ROUTINE W REFLEX MICROSCOPIC
Bilirubin Urine: NEGATIVE
Glucose, UA: NEGATIVE mg/dL
Ketones, ur: 5 mg/dL — AB
Nitrite: NEGATIVE
Protein, ur: 100 mg/dL — AB
RBC / HPF: >50 RBC/hpf — ABNORMAL HIGH (ref 0–5)
Specific Gravity, Urine: 1.02 (ref 1.005–1.030)
WBC, UA: >50 WBC/hpf — ABNORMAL HIGH (ref 0–5)
pH: 5 (ref 5.0–8.0)

## 2019-11-11 LAB — BLOOD GAS, ARTERIAL
Acid-Base Excess: 2.3 mmol/L — ABNORMAL HIGH (ref 0.0–2.0)
Bicarbonate: 26.5 mmol/L (ref 20.0–28.0)
Drawn by: 331471
FIO2: 28
O2 Saturation: 98.5 %
Patient temperature: 98.6
pCO2 arterial: 42.1 mmHg (ref 32.0–48.0)
pH, Arterial: 7.416 (ref 7.350–7.450)
pO2, Arterial: 122 mmHg — ABNORMAL HIGH (ref 83.0–108.0)

## 2019-11-11 LAB — CBC
HCT: 35.7 % — ABNORMAL LOW (ref 39.0–52.0)
Hemoglobin: 10.6 g/dL — ABNORMAL LOW (ref 13.0–17.0)
MCH: 31.2 pg (ref 26.0–34.0)
MCHC: 29.7 g/dL — ABNORMAL LOW (ref 30.0–36.0)
MCV: 105 fL — ABNORMAL HIGH (ref 80.0–100.0)
Platelets: 144 10*3/uL — ABNORMAL LOW (ref 150–400)
RBC: 3.4 MIL/uL — ABNORMAL LOW (ref 4.22–5.81)
RDW: 15.3 % (ref 11.5–15.5)
WBC: 37.1 10*3/uL — ABNORMAL HIGH (ref 4.0–10.5)
nRBC: 0 % (ref 0.0–0.2)

## 2019-11-11 LAB — MRSA PCR SCREENING: MRSA by PCR: NEGATIVE

## 2019-11-11 LAB — PROCALCITONIN: Procalcitonin: 9.75 ng/mL

## 2019-11-11 MED ORDER — SODIUM CHLORIDE 0.9 % IV BOLUS
1000.0000 mL | Freq: Once | INTRAVENOUS | Status: AC
  Administered 2019-11-11: 1000 mL via INTRAVENOUS

## 2019-11-11 MED ORDER — CLONAZEPAM 0.125 MG PO TBDP
1.0000 mg | ORAL_TABLET | Freq: Every day | ORAL | Status: DC
  Administered 2019-11-11 – 2019-11-13 (×3): 1 mg via ORAL
  Filled 2019-11-11 (×3): qty 2

## 2019-11-11 MED ORDER — CLONAZEPAM 0.5 MG PO TBDP: 0.5000 mg | ORAL_TABLET | Freq: Two times a day (BID) | ORAL | Status: DC

## 2019-11-11 MED ORDER — LORAZEPAM 2 MG/ML IJ SOLN
0.5000 mg | Freq: Four times a day (QID) | INTRAMUSCULAR | Status: DC | PRN
  Administered 2019-11-11 – 2019-11-15 (×11): 0.5 mg via INTRAVENOUS
  Filled 2019-11-11 (×11): qty 1

## 2019-11-11 MED ORDER — FLAVOXATE HCL 100 MG PO TABS: 100.0000 mg | ORAL_TABLET | Freq: Three times a day (TID) | ORAL | Status: DC

## 2019-11-11 MED ORDER — CLONAZEPAM 0.5 MG PO TBDP
0.5000 mg | ORAL_TABLET | Freq: Two times a day (BID) | ORAL | Status: AC
  Administered 2019-11-11 – 2019-11-13 (×5): 0.5 mg via ORAL
  Filled 2019-11-11 (×5): qty 1

## 2019-11-11 MED ORDER — FLAVOXATE HCL 100 MG PO TABS
100.0000 mg | ORAL_TABLET | Freq: Three times a day (TID) | ORAL | Status: AC
  Administered 2019-11-11 – 2019-11-14 (×9): 100 mg via ORAL
  Filled 2019-11-11 (×9): qty 1

## 2019-11-11 MED ORDER — DEXTROSE 5 % IV SOLN: INTRAVENOUS | Status: DC

## 2019-11-11 MED ORDER — LEVOTHYROXINE SODIUM 100 MCG/5ML IV SOLN
50.0000 ug | Freq: Every day | INTRAVENOUS | Status: DC
  Administered 2019-11-11 – 2019-11-12 (×2): 50 ug via INTRAVENOUS
  Filled 2019-11-11 (×2): qty 5

## 2019-11-11 MED ORDER — CLONAZEPAM 0.5 MG PO TBDP
0.5000 mg | ORAL_TABLET | Freq: Once | ORAL | Status: AC
  Administered 2019-11-11: 0.5 mg via ORAL
  Filled 2019-11-11: qty 1

## 2019-11-11 NOTE — Progress Notes (Signed)
[facility]                                                                              Patient Demographics  [name], is a 68 y.o. male, DOB - 07-15-1951, KXF:[id number]  Admit date - 10/25/2019   Admitting Physician Bonnell Public, MD  Outpatient Primary MD for the patient is [name], [place]  Outpatient specialists:   LOS - 17  days   Medical records reviewed and are as summarized below:    No chief complaint on file.      Brief summary   Patient is a 68 year old male, obese, with PMH significant for paranoid schizophrenia, prolonged QT interval, non-Hodgkin's lymphoma, hypertension, hyperlipidemia, COPD, diastolic congestive heart failure and chronic kidney disease.  Patient lives in a group home.   12/9, patient was sent to the ED for worsening shortness of breath, fever chills and low O2 sat at 89%.  In the ED, patient was started on O2 by nasal cannula.   Work-up showed elevated creatinine to 3.09 (up from 1.67).  COVID-19 PCR was positive.   Chest x-ray revealed multifocal pneumonia.  Patient was admitted to hospital service for further evaluation and management. Was initially hospitalized for Covid pneumonia for which he completed the treatment.  His hospital course was complicated by AKI, acute urinary retention. He also currently seems to have some drug reaction versus fungal reaction in the groin, left axilla and left flank  Patient has completed Decadron, IV remdesivir course.  However has remained encephalopathic with intermittent agitation and drowsiness, awaiting for skilled nursing facility On 12/25, patient started spiking fevers with, hypotensive, stat labs showed creatinine of 4.28, WBCs 47.2, procalcitonin 22.18. Patient was transferred to stepdown unit for sepsis with shock  Assessment & Plan    Principal problem  1. Acute Hypoxic Resp. Failure due to Acute Covid 19 Viral Pneumonia during the ongoing 2020 Covid 19  Pandemic - POA - Patient was hypoxic at the time of admission with O2 sats at 89%.  Presented with fevers chills and shortness of breath. -COVID-19 test was positive, chest x-ray showed multifocal pneumonia.  Inflammatory markers were elevated.  Procalcitonin negative. -O2 sats 94% on 2 L. - completed 5 days course of remdesivir on 12/13 and 10-day course of Decadron on 12/18  -Patient had completed course of IV Rocephin and doxycycline. -CT abdomen pelvis on 12/25 showed nodular appearance of disease in the right lung base, recommended surveillance, follow-up noncontrast CT in 6 to 8 weeks  Acute kidney injury on CKD stage IV, sepsis with shock -Presented with creatinine of 3.09, up from 1.67.  Meloxicam PT was placed on hold -Renal ultrasound unremarkable with no hydronephrosis or obstruction. -On 12/25, patient was noted to be spiking temp, hypotensive.  Stat labs showed worsened AKI 4.28, WBC 47.2 -Patient received multiple fluid boluses, chest x-ray showed no significant change in the patchy right greater than left perihilar and lower lobe airspace opacities.  Renal US on 12/10 had shown no hydronephrosis but ?75mm nonobstructing central right renal calculus  -  CT abdomen pelvis showed a malpositioned Foley catheter (patient had been pulling his Foley  catheter during agitation), potential cystitis and ascending urinary tract infection, prostatitis. -Follow blood cultures, urine culture, continue IV cefepime -Creatinine now improving 3.49 (<-- 4.34), procalcitonin improving 9.75 (<-22.18) -Foley catheter was replaced  Acute metabolic encephalopathy with intermittent hallucinations/agitation, history of schizoaffective disorder -On multiple psych meds clozapine, benztropine, Thorazine, Lamictal, Lexapro -Patient has remained confused, somnolent to extremely agitated.  Psychiatry has been following. Psychiatry recommended to discontinue Cogentin, Thorazine, Lamictal.  -This morning, extremely  somnolent, had received Ativan at night.  ABG normal.  Ammonia level 25.  CT head normal. -Seen by psychiatry today, several adjustments made on 12/25, will likely need a day or 2 more before he responds to the adjustments.  In the meantime continue clozapine at lower dose and add clonazepam.  Hypernatremia -Sodium trending up, 148, poor p.o. intake due to somnolence and agitation -Change to D5 drip at 100 cc/hour  Acute renal urinary retention - -Patient had a Foley catheter placed on admission for urine retention.   -12/16, catheter was removed.  Patient passed voiding trial but he had retention again the next day.  -12/17, a coud catheter was inserted. Needs voiding trial tried again in the next 1 to 2 weeks. -12/20 - no pain in the scrotum.  Ultrasound scrotum normal.  Drug reaction/fungal infection -Currently on lower extremities, left flank, axilla, groin area -Not on any antibiotics which were started during hospitalization -Started on nystatin topical  Hyperkalemia Resolved  QTC prolongation -QTC 478 on EKG 12/20 -Keep potassium> 4, magnesium> 2, check bmet -Repeat EKG  Chronic diastolic CHF -Compensated, 2D echo in 09/2018 showed EF of 65 to 93% with LV diastolic dysfunction  Hyperlipidemia Continue Lipitor  Hypothyroidism Change to IV Synthroid  Morbid obesity  BMI 33.9, patient advised diet and weight control  pressure injury Stage II, left buttocks, present on admission -Wound care per nursing   Code Status: Full code DVT Prophylaxis:  Lovenox Family Communication:   Disposition Plan: Needs skilled nursing facility, difficult placement due to underlying psychiatry illness.    Time Spent in minutes 25 minutes  Procedures:  None  Consultants:   Psychiatry  Antimicrobials:   Anti-infectives (From admission, onward)   Start     Dose/Rate Route Frequency Ordered Stop   11/10/19 1700  ceFEPIme (MAXIPIME) 2 g in sodium chloride 0.9 % 100 mL IVPB      2 g 200 mL/hr over 30 Minutes Intravenous Daily 11/10/19 1648     10/26/19 1000  remdesivir 100 mg in sodium chloride 0.9 % 100 mL IVPB  Status:  Discontinued     100 mg 200 mL/hr over 30 Minutes Intravenous Daily 10/25/19 2229 10/25/19 2254   10/26/19 1000  remdesivir 100 mg in sodium chloride 0.9 % 100 mL IVPB     100 mg 200 mL/hr over 30 Minutes Intravenous Daily 10/25/19 2135 10/29/19 1045   10/25/19 2230  cefTRIAXone (ROCEPHIN) 1 g in sodium chloride 0.9 % 100 mL IVPB  Status:  Discontinued     1 g 200 mL/hr over 30 Minutes Intravenous Every 24 hours 10/25/19 2228 10/30/19 1746   10/25/19 2230  doxycycline (VIBRA-TABS) tablet 100 mg  Status:  Discontinued     100 mg Oral Every 12 hours 10/25/19 2229 10/30/19 1746   10/25/19 2228  remdesivir 200 mg in sodium chloride 0.9% 250 mL IVPB  Status:  Discontinued     200 mg 580 mL/hr over 30 Minutes Intravenous Once 10/25/19 2229 10/25/19 2254   10/25/19 2200  remdesivir 200 mg  in sodium chloride 0.9% 250 mL IVPB     200 mg 580 mL/hr over 30 Minutes Intravenous Once 10/25/19 2135 10/26/19 0256         Medications  Scheduled Meds:  aspirin EC  81 mg Oral Daily   atorvastatin  20 mg Oral q1800   Chlorhexidine Gluconate Cloth  6 each Topical Daily   cholecalciferol  5,000 Units Oral Daily   clonazepam  0.5 mg Oral BID   clonazepam  1 mg Oral QHS   clotrimazole   Topical BID   cloZAPine  100 mg Oral QHS   docusate sodium  100 mg Oral Daily   enoxaparin (LOVENOX) injection  30 mg Subcutaneous QHS   escitalopram  10 mg Oral QAC breakfast   feeding supplement (ENSURE ENLIVE)  237 mL Oral BID BM   fluticasone  1 puff Inhalation BID   folic acid  1 mg Oral Daily   Ipratropium-Albuterol  1 puff Inhalation TID   levothyroxine  50 mcg Intravenous Daily   multivitamin with minerals  1 tablet Oral Daily   nystatin  1 Bottle Topical Daily   polyethylene glycol  17 g Oral Daily   senna  1 tablet Oral Daily    thiamine  100 mg Oral Daily   vitamin C  500 mg Oral Daily   zinc sulfate  220 mg Oral Daily   Continuous Infusions:  ceFEPime (MAXIPIME) IV Stopped (11/11/19 1213)   dextrose 5 % and 0.45% NaCl 100 mL/hr at 11/11/19 1040   sodium chloride     PRN Meds:.acetaminophen, Ipratropium-Albuterol, LORazepam      Subjective:   [name] was seen and examined today.  Very somnolent, difficult to arouse.  Sitter at the bedside.  Had received Ativan overnight. Difficult obtain review of system from the patient.  No fevers or acute respiratory distress.  Objective:   Vitals:   11/11/19 0400 11/11/19 0500 11/11/19 0600 11/11/19 0800  BP: (!) 109/54 (!) 95/51 (!) 109/52 (!) 123/54  Pulse: (!) 113 99 93 88  Resp: (!) 28 (!) 26 (!) 25 (!) 21  Temp:      TempSrc:      SpO2: 94% 97% 96% 98%  Weight:      Height:        Intake/Output Summary (Last 24 hours) at 11/11/2019 1319 Last data filed at 11/11/2019 7628 Gross per 24 hour  Intake 1809.54 ml  Output 500 ml  Net 1309.54 ml     Wt Readings from Last 3 Encounters:  11/08/19 113 kg  11/06/18 124.7 kg  10/03/18 122.7 kg   Physical Exam  General: Somnolent, difficult to arouse  Eyes: Pinpoint pupil  HEENT:  Atraumatic, normocephalic  Cardiovascular: S1 S2 clear, RRR. No pedal edema b/l  Respiratory: CTAB, no wheezing, rales or rhonchi  Gastrointestinal: Soft, nontender, nondistended, NBS  Ext: no pedal edema bilaterally  Neuro: Not following any commands  Musculoskeletal: No cyanosis, clubbing  Skin: No rashes  Psych: Somnolent, difficult to arouse      Data Reviewed:  I have personally reviewed following labs and imaging studies  Micro Results Recent Results (from the past 240 hour(s))  Culture, blood (routine x 2)     Status: None (Preliminary result)   Collection Time: 11/10/19  5:30 PM   Specimen: BLOOD RIGHT HAND  Result Value Ref Range Status   Specimen Description BLOOD RIGHT HAND  Final    Special Requests   Final    BOTTLES DRAWN AEROBIC ONLY  Blood Culture adequate volume   Culture   Final    NO GROWTH < 12 HOURS Performed at [facility] [address], [place], [place] [address]    Report Status PENDING  Incomplete  Culture, blood (routine x 2)     Status: None (Preliminary result)   Collection Time: 11/10/19  5:30 PM   Specimen: BLOOD RIGHT ARM  Result Value Ref Range Status   Specimen Description   Final    BLOOD RIGHT ARM Performed at [facility], [place] [address], [place], [place] [address]    Special Requests   Final    BOTTLES DRAWN AEROBIC AND ANAEROBIC Blood Culture adequate volume Performed at [facility] [address], [place], [place] [address]    Culture   Final    NO GROWTH < 12 HOURS Performed at [facility] [address] [place], [place] [address]    Report Status PENDING  Incomplete  MRSA PCR Screening     Status: None   Collection Time: 11/10/19  9:09 PM   Specimen: Nasal Mucosa; Nasopharyngeal  Result Value Ref Range Status   MRSA by PCR NEGATIVE NEGATIVE Final    Comment:        The GeneXpert MRSA Assay (FDA approved for NASAL specimens only), is one component of a comprehensive MRSA colonization surveillance program. It is not intended to diagnose MRSA infection nor to guide or monitor treatment for MRSA infections. Performed at [facility], [place] [address], [place], [place] [address]     Radiology Reports CT ABDOMEN PELVIS WO CONTRAST  Addendum Date: 11/11/2019   ADDENDUM REPORT: 11/11/2019 02:38 ADDENDUM: These results were called by telephone on 11/11/2019 at 2:38 am to provider Lang Snow , who verbally acknowledged these results. Electronically Signed   By: Lovena Le M.D.   On: 11/11/2019 02:38   Result Date: 11/11/2019 CLINICAL DATA:  Abdominal pain, acute renal failure and sepsis. EXAM: CT ABDOMEN AND PELVIS WITHOUT CONTRAST TECHNIQUE: Multidetector CT  imaging of the abdomen and pelvis was performed following the standard protocol without IV contrast. COMPARISON:  Radiograph 11/10/2019, renal ultrasound 36/14/4315, CT renal colic 40/06/6760 FINDINGS: Lower chest: There mixed patchy opacities in the lung bases. Slightly nodular appearance of consolidation in the right lower lobe warrants continued surveillance to resolution. Trace effusions are present. Some bandlike areas of opacity likely reflect atelectasis and/or scarring Hepatobiliary: No focal liver abnormality is seen. No gallstones, gallbladder wall thickening, or biliary dilatation. Pancreas: Unremarkable. No pancreatic ductal dilatation or surrounding inflammatory changes. Spleen: Mild splenomegaly.  No focal splenic lesions. Adrenals/Urinary Tract: Normal adrenal glands. Mild bilateral renal atrophy. No visible concerning renal lesions. Mild bilateral perinephric stranding similar to comparison study. Slightly asymmetric right periureteral stranding is noted as well as circumferential bladder wall thickening and few foci of anti dependent gas. A Foley catheter is positioned with the tip in the urinary bladder and inflated balloon within the level of the prostatic urethra. No visible urolithiasis or hydronephrosis. Stomach/Bowel: Distal esophagus, stomach and duodenal sweep are unremarkable. No small bowel wall thickening or dilatation. No evidence of obstruction. A normal appendix is visualized. No colonic dilatation or wall thickening. Vascular/Lymphatic: Atherosclerotic plaque within the normal caliber aorta. No suspicious or enlarged lymph nodes in the included lymphatic chains. Reproductive: Inflated Foley catheter balloon in the mid prostate. Hazy stranding is also seen about the prostate. Coarse eccentric prostate calcifications are noted. Mild prostatomegaly is evident. Other: Few foci of  subcutaneous gas in the right lower quadrant may be related to injectable use. No free fluid or free air in  the abdomen or pelvis. Perivesicular hazy stranding is noted. Musculoskeletal: Multilevel degenerative changes are present in the imaged portions of the spine. No acute osseous abnormality or suspicious osseous lesion. IMPRESSION: 1. Malpositioned Foley catheter with the inflated balloon at the level of the prostatic urethra. 2. Circumferential bladder wall thickening and some Peri ureteral stranding are worrisome for potential cystitis and ascending urinary tract infection. Intraluminal bladder gas may be related to Foley instrumentation. 3. Additional phlegmonous changes seen about the prostate compatible with prostatitis. No discernible abscess or collection. 4. Mixed interstitial airspace opacities in the lung bases compatible with atypical infection. More nodular appearance of disease in the right lung base may warrant imaging surveillance to completion. Consider follow-up noncontrast CT in 6-8 weeks to ensure resolution. Currently attempting to contact the ordering provider and/or care team. Addendum will be submitted upon case discussion. Electronically Signed: By: Lovena Le M.D. On: 11/11/2019 02:32   CT HEAD WO CONTRAST  Result Date: 11/11/2019 CLINICAL DATA:  Neurologic deficits.  Altered mental status. EXAM: CT HEAD WITHOUT CONTRAST TECHNIQUE: Contiguous axial images were obtained from the base of the skull through the vertex without intravenous contrast. COMPARISON:  12/26/2018 FINDINGS: Brain: Ventricles, cisterns and other CSF spaces are within normal. There is no mass, mass effect, shift of midline structures or acute hemorrhage. No evidence of acute infarction. Vascular: No hyperdense vessel or unexpected calcification. Skull: Normal. Negative for fracture or focal lesion. Sinuses/Orbits: No acute finding. Other: None. IMPRESSION: No acute findings. Electronically Signed   By: Marin Olp M.D.   On: 11/11/2019 11:28   US SCROTUM  Result Date: 11/05/2019 CLINICAL DATA:  Testicle pain  EXAM: SCROTAL ULTRASOUND DOPPLER ULTRASOUND OF THE TESTICLES TECHNIQUE: Complete ultrasound examination of the testicles, epididymis, and other scrotal structures was performed. Color and spectral Doppler ultrasound were also utilized to evaluate blood flow to the testicles. COMPARISON:  None. FINDINGS: Right testicle Measurements: 3.8 x 2 x 2.9 cm. No mass or microlithiasis visualized. Left testicle Measurements:  3 x 2.2 x 2.7 cm.  No mass or microlithiasis. Right epididymis:  Normal in size and appearance. Left epididymis:  Normal in size and appearance. Hydrocele:  There are small bilateral hydroceles. Varicocele:  None visualized. Pulsed Doppler interrogation of both testes demonstrates normal low resistance arterial and venous waveforms bilaterally. IMPRESSION: No acute abnormality detected. Electronically Signed   By: Constance Holster M.D.   On: 11/05/2019 18:49   US RENAL  Result Date: 10/26/2019 CLINICAL DATA:  Acute kidney injury, history CHF, CLL, hypertension, COPD, non-Hodgkin's lymphoma EXAM: RENAL / URINARY TRACT ULTRASOUND COMPLETE COMPARISON:  CT abdomen and pelvis 07/17/2018 FINDINGS: Right Kidney: Renal measurements: 10.2 x 4.6 x 4.9 cm = volume: 120 mL. Cortical thinning. Upper normal cortical echogenicity. No mass or hydronephrosis. Suspected 5 mm nonobstructing calculus; no RIGHT renal calculi were seen on the prior CT exam from 2019. Left Kidney: Renal measurements: 12.3 x 5.2 x 4.5 cm = volume: 150 mL. Cortical thinning. Upper normal cortical echogenicity. No mass, hydronephrosis or shadowing calculi. Bladder: Question mildly trabeculated bladder wall.  No focal mass. Other: None. IMPRESSION: BILATERAL renal cortical atrophy. No evidence of renal mass or hydronephrosis. Question 5 mm nonobstructing central RIGHT renal calculus. Electronically Signed   By: Lavonia Dana M.D.   On: 10/26/2019 16:45   DG CHEST PORT 1 VIEW  Result Date: 11/10/2019 CLINICAL DATA:  Sepsis. Multifocal  pneumonia. History of non-Hodgkin's lymphoma. EXAM: PORTABLE CHEST 1 VIEW COMPARISON:  Radiographs 10/25/2019 and 12/26/2018. CT 02/06/2011. FINDINGS: 1656 hours. The heart size and mediastinal contours are stable. Widening of the superior mediastinum appears similar to the recent prior studies, probably reflecting mediastinal fat. There are persistent low lung volumes with patchy right greater than left perihilar and lower lobe airspace opacities, again concerning for atypical infection. No pneumothorax or significant pleural effusion. The bones appear unchanged. IMPRESSION: 1. No significant change in patchy right greater than left perihilar and lower lobe airspace opacities, concerning for atypical infection. 2. Stable mediastinal widening, likely due to mediastinal fat and suboptimal inspiration. Correlation with at least two view radiographs recommended when the patient is able. Electronically Signed   By: Richardean Sale M.D.   On: 11/10/2019 17:14   DG Chest Port 1 View  Result Date: 10/25/2019 CLINICAL DATA:  Altered level of consciousness. EXAM: PORTABLE CHEST 1 VIEW COMPARISON:  12/26/2018 FINDINGS: Normal heart size. No pleural effusion or edema identified. Bilateral airspace opacities are identified involving both mid and lower lung zones. IMPRESSION: 1. Bilateral airspace opacities are identified concerning for multifocal infection. Electronically Signed   By: Kerby Moors M.D.   On: 10/25/2019 19:25    Lab Data:  CBC: Recent Labs  Lab 11/05/19 0252 11/07/19 0326 11/10/19 1235 11/10/19 1730 11/11/19 0303  WBC 17.2* 14.3* 47.2* 43.2* 37.1*  NEUTROABS 12.0* 11.3*  --  36.9*  --   HGB 12.5* 12.7* 11.8* 11.6* 10.6*  HCT 39.5 38.9* 38.4* 38.2* 35.7*  MCV 99.2 96.0 102.4* 105.5* 105.0*  PLT 208 155 148* 137* 161*   Basic Metabolic Panel: Recent Labs  Lab 11/06/19 0257 11/07/19 0326 11/10/19 1235 11/10/19 1730 11/11/19 0900  NA 141 141 146* 145 148*  K 4.9 4.6 4.7 4.7 4.4    CL 104 104 108 109 112*  CO2 31 28 26 24 27   GLUCOSE 103* 115* 214* 190* 162*  BUN 47* 38* 70* 67* 62*  CREATININE 1.89* 1.83* 4.28* 4.34* 3.49*  CALCIUM 9.4 9.6 9.3 9.1 8.8*   GFR: Estimated Creatinine Clearance: 27 mL/min (A) (by C-G formula based on SCr of 3.49 mg/dL (H)). Liver Function Tests: Recent Labs  Lab 11/10/19 1235 11/10/19 1730 11/11/19 0900  AST 96* 71* 50*  ALT 33 28 29  ALKPHOS 74 73 121  BILITOT 1.0 0.6 0.6  PROT 6.0* 5.9* 6.0*  ALBUMIN 3.1* 2.9* 2.7*   No results for input(s): LIPASE, AMYLASE in the last 168 hours. Recent Labs  Lab 11/10/19 1236 11/11/19 0900  AMMONIA 25 25   Coagulation Profile: No results for input(s): INR, PROTIME in the last 168 hours. Cardiac Enzymes: No results for input(s): CKTOTAL, CKMB, CKMBINDEX, TROPONINI in the last 168 hours. BNP (last 3 results) No results for input(s): PROBNP in the last 8760 hours. HbA1C: No results for input(s): HGBA1C in the last 72 hours. CBG: No results for input(s): GLUCAP in the last 168 hours. Lipid Profile: No results for input(s): CHOL, HDL, LDLCALC, TRIG, CHOLHDL, LDLDIRECT in the last 72 hours. Thyroid Function Tests: Recent Labs    11/10/19 1300  TSH 1.038   Anemia Panel: Recent Labs    11/10/19 1236  VITAMINB12 425   Urine analysis:    Component Value Date/Time   COLORURINE AMBER (A) 11/11/2019 0244   APPEARANCEUR CLOUDY (A) 11/11/2019 0244   LABSPEC 1.020 11/11/2019 0244   PHURINE 5.0 11/11/2019 [place] 11/11/2019 0244   HGBUR  LARGE (A) 11/11/2019 0244   BILIRUBINUR NEGATIVE 11/11/2019 0244   KETONESUR 5 (A) 11/11/2019 0244   PROTEINUR 100 (A) 11/11/2019 0244   UROBILINOGEN 0.2 05/09/2015 1740   NITRITE NEGATIVE 11/11/2019 0244   LEUKOCYTESUR LARGE (A) 11/11/2019 0244     Nalia Honeycutt M.D. [facility] 11/11/2019, 1:19 PM   Call night coverage person covering after 7pm

## 2019-11-11 NOTE — Progress Notes (Signed)
Received page from Radiology regarding CT results.  Image shows patient's cathter balloon is inflated in the prostate and that there is some prostate and bladder inflammation, possibly indicative of UTI.  Advsd RN to remove current catheter and replace, obtaining sterile urine for UA when placing new catheter.

## 2019-11-11 NOTE — Consult Note (Signed)
[facility] Consult   Reason for Consult: Mental status changes/delirium/extensive psychiatric history Referring Physician:  Rai Patient Identification: [name] MRN:  [id number] Principal Diagnosis: Schizophrenia ([place]) Diagnosis:  Principal Problem:   Schizophrenia ([place]) Active Problems:   Pneumonia due to severe acute respiratory syndrome coronavirus 2 (SARS-CoV-2)   Total Time spent with patient: 15 minutes  Subjective:   [name] is a 68 y.o. male patient admitted with a history of treatment resistant schizophrenia that in the past has required a combination of clozapine and Thorazine despite cardiac risks in the presence of his QTC prolongation, he was admitted as discussed on 12/18 has suffered from a delirium and lack of mental stability since he has been hospitalized.  We felt that his overall medical compromise would not allow for the tolerance of psychotropic polypharmacy and greatly reduced his medications yesterday, today he is found to be either sedated or incoherent and agitated.  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 Cellulitis   . CHF (congestive heart failure) ([place])   . Chronic kidney disease 09/20/2008  . Chronic lymphocytic leukemia ([place])   . cll dx'd 10/2003   no rx  . CLL (chronic lymphoblastic leukemia) 10/2003  . COPD (chronic obstructive pulmonary disease) ([place])   . GERD (gastroesophageal reflux disease)   . Hiatal hernia   . Hyperlipemia   . Hypertension   . Lower extremity edema   . NHL (non-Hodgkin's lymphoma) ([place]) dx'd 08/2008 rt groin   chemo comp 08/2008  . Non Hodgkin's lymphoma ([place]) 08/2008  . Non Hodgkin's lymphoma ([place])   . Paranoid schizophrenia ([place])   . Prolonged Q-T interval  on ECG   . RBBB 09/17/2017  . Schizoaffective disorder ([place])   . Sepsis ([place])   . Severe left ventricular hypertrophy 10/21/2017  . Vitamin D deficiency     Past Surgical History:  Procedure Laterality Date  . LYMPH NODE BIOPSY     Family History:  Family History  Problem Relation Age of Onset  . Alcohol abuse Father    Family Psychiatric  History: Unable to obtain Social History:  Social History   Substance and Sexual Activity  Alcohol Use No     Social History   Substance and Sexual Activity  Drug Use No    Social History   Socioeconomic History  . Marital status: Single    Spouse name: Not on file  . Number of children: Not on file  . Years of education: Not on file  . Highest education level: Not on file  Occupational History  . Not on file  Tobacco Use  . Smoking status: Never Smoker  . Smokeless tobacco: Never Used  Substance and Sexual Activity  . Alcohol use: No  . Drug use: No  . Sexual activity: Never  Other Topics Concern  . Not on file  Social History Narrative  . Not on file   Social Determinants of Health   Financial Resource Strain:   . Difficulty of Paying Living Expenses: Not on file  Food Insecurity:   . Worried About Charity fundraiser in the Last Year: Not on file  . Ran Out of Food in the Last Year: Not on file  Transportation Needs:   . Lack of Transportation (Medical): Not on file  .  Lack of Transportation (Non-Medical): Not on file  Physical Activity:   . Days of Exercise per Week: Not on file  . Minutes of Exercise per Session: Not on file  Stress:   . Feeling of Stress : Not on file  Social Connections:   . Frequency of Communication with Friends and Family: Not on file  . Frequency of Social Gatherings with Friends and Family: Not on file  . Attends Religious Services: Not on file  . Active Member of Clubs or Organizations: Not on file  . Attends Archivist Meetings: Not on file  . Marital Status: Not on file    Additional Social History:    Allergies:   Allergies  Allergen Reactions  . Sulfamethoxazole Rash    Labs:  Results for orders placed or performed during the hospital encounter of 10/25/19 (from the past 48 hour(s))  Comprehensive metabolic panel     Status: Abnormal   Collection Time: 11/10/19 12:35 PM  Result Value Ref Range   Sodium 146 (H) 135 - 145 mmol/L   Potassium 4.7 3.5 - 5.1 mmol/L   Chloride 108 98 - 111 mmol/L   CO2 26 22 - 32 mmol/L   Glucose, Bld 214 (H) 70 - 99 mg/dL   BUN 70 (H) 8 - 23 mg/dL   Creatinine, Ser 4.28 (H) 0.61 - 1.24 mg/dL   Calcium 9.3 8.9 - 10.3 mg/dL   Total Protein 6.0 (L) 6.5 - 8.1 g/dL   Albumin 3.1 (L) 3.5 - 5.0 g/dL   AST 96 (H) 15 - 41 U/L   ALT 33 0 - 44 U/L   Alkaline Phosphatase 74 38 - 126 U/L   Total Bilirubin 1.0 0.3 - 1.2 mg/dL   GFR calc non Af Amer 13 (L) >60 mL/min   GFR calc Af Amer 16 (L) >60 mL/min   Anion gap 12 5 - 15    Comment: Performed at [facility], [place] [address] [place], [place] [address]  CBC     Status: Abnormal   Collection Time: 11/10/19 12:35 PM  Result Value Ref Range   WBC 47.2 (H) 4.0 - 10.5 K/uL   RBC 3.75 (L) 4.22 - 5.81 MIL/uL   Hemoglobin 11.8 (L) 13.0 - 17.0 g/dL   HCT 38.4 (L) 39.0 - 52.0 %   MCV 102.4 (H) 80.0 - 100.0 fL   MCH 31.5 26.0 - 34.0 pg   MCHC 30.7 30.0 - 36.0 g/dL   RDW 15.2 11.5 - 15.5 %   Platelets 148 (L) 150 - 400 K/uL   nRBC 0.0 0.0 - 0.2 %    Comment: Performed at [facility], [place] [address] [place], [place] [address]  Ammonia     Status: None   Collection Time: 11/10/19 12:36 PM  Result Value Ref Range   Ammonia 25 9 - 35 umol/L    Comment: Performed at [facility], [place] [address] [place], [place] [address]  Vitamin B12     Status: None   Collection Time: 11/10/19 12:36 PM  Result Value Ref Range   Vitamin B-12 425 180 - 914 pg/mL    Comment: (NOTE) This assay is not validated for testing neonatal  or myeloproliferative syndrome specimens for Vitamin B12 levels. Performed at [facility], [place] [address], [place],  [address]   TSH     Status: None   Collection Time: 11/10/19  1:00 PM  Result Value Ref Range   TSH  1.038 0.350 - 4.500 uIU/mL    Comment: Performed by a 3rd Generation assay with a functional sensitivity of <=0.01 uIU/mL. Performed at [facility], [place] [address] [place], [place] [address]   Culture, blood (routine x 2)     Status: None (Preliminary result)   Collection Time: 11/10/19  5:30 PM   Specimen: BLOOD RIGHT HAND  Result Value Ref Range   Specimen Description BLOOD RIGHT HAND    Special Requests      BOTTLES DRAWN AEROBIC ONLY Blood Culture adequate volume   Culture      NO GROWTH < 12 HOURS Performed at [facility], [place] [address] [place], [place] [address]    Report Status PENDING   Culture, blood (routine x 2)     Status: None (Preliminary result)   Collection Time: 11/10/19  5:30 PM   Specimen: BLOOD RIGHT ARM  Result Value Ref Range   Specimen Description      BLOOD RIGHT ARM Performed at [place] [address], [place], [place] [address]    Special Requests      BOTTLES DRAWN AEROBIC AND ANAEROBIC Blood Culture adequate volume Performed at [place] [address], [place], [place] [address]    Culture      NO GROWTH < 12 HOURS Performed at [place] [address] [place], [place] [address]    Report Status PENDING   Procalcitonin - Baseline     Status: None   Collection Time: 11/10/19  5:30 PM  Result Value Ref Range   Procalcitonin 22.18 ng/mL    Comment:        Interpretation: PCT >= 10 ng/mL: Important systemic inflammatory response, almost exclusively due to severe bacterial sepsis or septic shock. (NOTE)       Sepsis PCT Algorithm           Lower Respiratory Tract                                      Infection PCT Algorithm     ----------------------------     ----------------------------         PCT < 0.25 ng/mL                PCT < 0.10 ng/mL         Strongly encourage             Strongly discourage   discontinuation of antibiotics    initiation of antibiotics    ----------------------------     -----------------------------       PCT 0.25 - 0.50 ng/mL            PCT 0.10 - 0.25 ng/mL               OR       >80% decrease in PCT            Discourage initiation of                                            antibiotics      Encourage discontinuation           of antibiotics    ----------------------------     -----------------------------  PCT >= 0.50 ng/mL              PCT 0.26 - 0.50 ng/mL                AND       <80% decrease in PCT             Encourage initiation of                                             antibiotics       Encourage continuation           of antibiotics    ----------------------------     -----------------------------        PCT >= 0.50 ng/mL                  PCT > 0.50 ng/mL               AND         increase in PCT                  Strongly encourage                                      initiation of antibiotics    Strongly encourage escalation           of antibiotics                                     -----------------------------                                           PCT <= 0.25 ng/mL                                                 OR                                        > 80% decrease in PCT                                     Discontinue / Do not initiate                                             antibiotics Performed at [place] [address] [place], [place] [address]   Lactic acid, plasma     Status: None   Collection Time: 11/10/19  5:30 PM  Result Value Ref Range   Lactic Acid, Venous 1.4 0.5 - 1.9 mmol/L    Comment: Performed at [facility], [place] [address] [place], [place] [address]  Comprehensive  metabolic panel     Status: Abnormal   Collection Time: 11/10/19  5:30 PM  Result Value Ref Range   Sodium 145 135 - 145 mmol/L   Potassium 4.7 3.5 - 5.1 mmol/L   Chloride 109 98 - 111 mmol/L   CO2 24 22 - 32 mmol/L   Glucose, Bld 190 (H) 70 - 99 mg/dL   BUN 67 (H) 8 - 23 mg/dL   Creatinine, Ser 4.34 (H) 0.61 - 1.24 mg/dL   Calcium 9.1 8.9 - 10.3 mg/dL   Total Protein 5.9 (L) 6.5 - 8.1 g/dL   Albumin 2.9 (L) 3.5 - 5.0 g/dL   AST 71 (H) 15 - 41 U/L   ALT 28 0 - 44 U/L   Alkaline Phosphatase 73 38 - 126 U/L   Total Bilirubin 0.6 0.3 - 1.2 mg/dL   GFR calc non Af Amer 13 (L) >60 mL/min   GFR calc Af Amer 15 (L) >60 mL/min   Anion gap 12 5 - 15    Comment: Performed at [facility], [place] [address] [place], [place] [address]  CBC with Differential/Platelet     Status: Abnormal   Collection Time: 11/10/19  5:30 PM  Result Value Ref Range   WBC 43.2 (H) 4.0 - 10.5 K/uL   RBC 3.62 (L) 4.22 - 5.81 MIL/uL   Hemoglobin 11.6 (L) 13.0 - 17.0 g/dL   HCT 38.2 (L) 39.0 - 52.0 %   MCV 105.5 (H) 80.0 - 100.0 fL   MCH 32.0 26.0 - 34.0 pg   MCHC 30.4 30.0 - 36.0 g/dL   RDW 15.2 11.5 - 15.5 %   Platelets 137 (L) 150 - 400 K/uL   nRBC 0.0 0.0 - 0.2 %   Neutrophils Relative % 85 %   Neutro Abs 36.9 (H) 1.7 - 7.7 K/uL   Lymphocytes Relative 3 %   Lymphs Abs 1.1 0.7 - 4.0 K/uL   Monocytes Relative 5 %   Monocytes Absolute 2.2 (H) 0.1 - 1.0 K/uL   Eosinophils Relative 0 %   Eosinophils Absolute 0.0 0.0 - 0.5 K/uL   Basophils Relative 0 %   Basophils Absolute 0.1 0.0 - 0.1 K/uL   Immature Granulocytes 7 %   Abs Immature Granulocytes 2.90 (H) 0.00 - 0.07 K/uL    Comment: Performed at [facility], [place] [address] [place], [place] [address]  Cortisol     Status: None   Collection Time: 11/10/19  5:52 PM  Result Value Ref Range   Cortisol, Plasma 49.4 ug/dL    Comment: (NOTE) AM    6.7 - 22.6 ug/dL PM   <10.0       ug/dL Performed at [place] [address], [place], [place] [address]   MRSA PCR Screening     Status: None   Collection Time: 11/10/19  9:09 PM   Specimen: Nasal Mucosa; Nasopharyngeal  Result Value Ref Range   MRSA by PCR NEGATIVE NEGATIVE    Comment:        The GeneXpert MRSA Assay (FDA approved for NASAL specimens only), is one component of a comprehensive MRSA colonization surveillance program. It is not intended to diagnose MRSA infection nor to guide or monitor treatment for MRSA infections. Performed at [facility], [place] [address] [place], [place] [address]   Urinalysis, Routine w reflex microscopic     Status: Abnormal   Collection Time: 11/11/19  2:44 AM  Result Value Ref Range  Color, Urine AMBER (A) YELLOW    Comment: BIOCHEMICALS MAY BE AFFECTED BY COLOR   APPearance CLOUDY (A) CLEAR   Specific Gravity, Urine 1.020 1.005 - 1.030   pH 5.0 5.0 - 8.0   Glucose, UA NEGATIVE NEGATIVE mg/dL   Hgb urine dipstick LARGE (A) NEGATIVE   Bilirubin Urine NEGATIVE NEGATIVE   Ketones, ur 5 (A) NEGATIVE mg/dL   Protein, ur 100 (A) NEGATIVE mg/dL   Nitrite NEGATIVE NEGATIVE   Leukocytes,Ua LARGE (A) NEGATIVE   RBC / HPF >50 (H) 0 - 5 RBC/hpf   WBC, UA >50 (H) 0 - 5 WBC/hpf   Bacteria, UA RARE (A) NONE SEEN   WBC Clumps PRESENT    Mucus PRESENT    Budding Yeast PRESENT    Hyaline Casts, UA PRESENT     Comment: Performed at [facility], [place] [address] [place], [place] [address]  CBC daily     Status: Abnormal   Collection Time: 11/11/19  3:03 AM  Result Value Ref Range   WBC 37.1 (H) 4.0 - 10.5 K/uL   RBC 3.40 (L) 4.22 - 5.81 MIL/uL   Hemoglobin 10.6 (L) 13.0 - 17.0 g/dL   HCT 35.7 (L) 39.0 - 52.0 %   MCV 105.0 (H) 80.0 - 100.0 fL   MCH 31.2 26.0 - 34.0 pg   MCHC 29.7 (L) 30.0 - 36.0 g/dL   RDW 15.3 11.5 - 15.5 %   Platelets 144 (L) 150 - 400 K/uL   nRBC 0.0 0.0 - 0.2 %    Comment: Performed at [facility], [place] [address]  [place], [place] [address]  Blood gas, arterial     Status: Abnormal   Collection Time: 11/11/19  8:18 AM  Result Value Ref Range   FIO2 28.00    pH, Arterial 7.416 7.350 - 7.450   pCO2 arterial 42.1 32.0 - 48.0 mmHg   pO2, Arterial 122 (H) 83.0 - 108.0 mmHg   Bicarbonate 26.5 20.0 - 28.0 mmol/L   Acid-Base Excess 2.3 (H) 0.0 - 2.0 mmol/L   O2 Saturation 98.5 %   Patient temperature 98.6    Collection site RIGHT RADIAL    Drawn by [id number]    Sample type ARTERIAL    Allens test (pass/fail) PASS PASS    Comment: Performed at [facility], [place] [address] [place], [place] [address]  Comprehensive metabolic panel     Status: Abnormal   Collection Time: 11/11/19  9:00 AM  Result Value Ref Range   Sodium 148 (H) 135 - 145 mmol/L   Potassium 4.4 3.5 - 5.1 mmol/L   Chloride 112 (H) 98 - 111 mmol/L   CO2 27 22 - 32 mmol/L   Glucose, Bld 162 (H) 70 - 99 mg/dL   BUN 62 (H) 8 - 23 mg/dL   Creatinine, Ser 3.49 (H) 0.61 - 1.24 mg/dL   Calcium 8.8 (L) 8.9 - 10.3 mg/dL   Total Protein 6.0 (L) 6.5 - 8.1 g/dL   Albumin 2.7 (L) 3.5 - 5.0 g/dL   AST 50 (H) 15 - 41 U/L   ALT 29 0 - 44 U/L   Alkaline Phosphatase 121 38 - 126 U/L   Total Bilirubin 0.6 0.3 - 1.2 mg/dL   GFR calc non Af Amer 17 (L) >60 mL/min   GFR calc Af Amer 20 (L) >60 mL/min   Anion gap 9 5 - 15    Comment: Performed at [facility], [place]  [address], [place], [place] [address]  Procalcitonin - Baseline     Status: None   Collection Time: 11/11/19  9:00 AM  Result Value Ref Range   Procalcitonin 9.75 ng/mL    Comment:        Interpretation: PCT > 2 ng/mL: Systemic infection (sepsis) is likely, unless other causes are known. (NOTE)       Sepsis PCT Algorithm           Lower Respiratory Tract                                      Infection PCT Algorithm    ----------------------------     ----------------------------         PCT < 0.25 ng/mL                PCT < 0.10 ng/mL         Strongly  encourage             Strongly discourage   discontinuation of antibiotics    initiation of antibiotics    ----------------------------     -----------------------------       PCT 0.25 - 0.50 ng/mL            PCT 0.10 - 0.25 ng/mL               OR       >80% decrease in PCT            Discourage initiation of                                            antibiotics      Encourage discontinuation           of antibiotics    ----------------------------     -----------------------------         PCT >= 0.50 ng/mL              PCT 0.26 - 0.50 ng/mL               AND       <80% decrease in PCT              Encourage initiation of                                             antibiotics       Encourage continuation           of antibiotics    ----------------------------     -----------------------------        PCT >= 0.50 ng/mL                  PCT > 0.50 ng/mL               AND         increase in PCT                  Strongly encourage  initiation of antibiotics    Strongly encourage escalation           of antibiotics                                     -----------------------------                                           PCT <= 0.25 ng/mL                                                 OR                                        > 80% decrease in PCT                                     Discontinue / Do not initiate                                             antibiotics Performed at [place] [address], [place], [place] [address]   Ammonia     Status: None   Collection Time: 11/11/19  9:00 AM  Result Value Ref Range   Ammonia 25 9 - 35 umol/L    Comment: Performed at [facility], [place] [address], [place], [place] [address]    Current Facility-Administered Medications  Medication Dose Route Frequency Provider Last Rate Last Admin  . acetaminophen (TYLENOL) tablet 650 mg  650 mg Oral Q6H PRN Rai,  Ripudeep K, MD   650 mg at 11/10/19 1233  . aspirin EC tablet 81 mg  81 mg Oral Daily Rai, Ripudeep K, MD   81 mg at 11/10/19 1016  . atorvastatin (LIPITOR) tablet 20 mg  20 mg Oral q1800 Rai, Ripudeep K, MD   20 mg at 11/09/19 2120  . ceFEPIme (MAXIPIME) 2 g in sodium chloride 0.9 % 100 mL IVPB  2 g Intravenous Daily Rai, Vernelle Emerald, MD   Stopped at 11/10/19 1812  . Chlorhexidine Gluconate Cloth 2 % PADS 6 each  6 each Topical Daily Rai, Ripudeep K, MD   6 each at 11/10/19 1300  . cholecalciferol (VITAMIN D3) tablet 5,000 Units  5,000 Units Oral Daily Rai, Ripudeep K, MD   5,000 Units at 11/10/19 1100  . clonazePAM (KLONOPIN) disintegrating tablet 0.5 mg  0.5 mg Oral BID Johnn Hai, MD      . clonazePAM Bobbye [place]) disintegrating tablet 1 mg  1 mg Oral QHS Johnn Hai, MD      . clotrimazole (LOTRIMIN) 1 % cream   Topical BID Mendel Corning, MD   Given at 11/11/19 [phone]  . cloZAPine (CLOZARIL) tablet 100 mg  100 mg Oral QHS Rai, Ripudeep K, MD      . dextrose 5 %-0.45 % sodium chloride infusion  Intravenous Continuous Rai, Ripudeep K, MD 100 mL/hr at 11/11/19 0100 Rate Verify at 11/11/19 0100  . docusate sodium (COLACE) capsule 100 mg  100 mg Oral Daily Rai, Ripudeep K, MD   100 mg at 11/10/19 1015  . enoxaparin (LOVENOX) injection 30 mg  30 mg Subcutaneous QHS Polly Cobia, RPH   30 mg at 11/10/19 2225  . escitalopram (LEXAPRO) tablet 10 mg  10 mg Oral QAC breakfast Rai, Ripudeep K, MD   10 mg at 11/10/19 1016  . feeding supplement (ENSURE ENLIVE) (ENSURE ENLIVE) liquid 237 mL  237 mL Oral BID BM Rai, Ripudeep K, MD   237 mL at 11/10/19 1030  . fluticasone (FLOVENT HFA) 220 MCG/ACT inhaler 1 puff  1 puff Inhalation BID Rai, Ripudeep K, MD   1 puff at 11/10/19 2227  . folic acid (FOLVITE) tablet 1 mg  1 mg Oral Daily Rai, Ripudeep K, MD   1 mg at 11/10/19 1014  . Ipratropium-Albuterol (COMBIVENT) respimat 1 puff  1 puff Inhalation TID Rai, Vernelle Emerald, MD   1 puff at 11/10/19 2228  .  Ipratropium-Albuterol (COMBIVENT) respimat 1 puff  1 puff Inhalation Q6H PRN Rai, Ripudeep K, MD      . levothyroxine (SYNTHROID, LEVOTHROID) injection 50 mcg  50 mcg Intravenous Daily Rai, Ripudeep K, MD      . LORazepam (ATIVAN) injection 0.5 mg  0.5 mg Intravenous Q6H PRN Rai, Ripudeep K, MD      . multivitamin with minerals tablet 1 tablet  1 tablet Oral Daily Rai, Ripudeep K, MD   1 tablet at 11/10/19 1015  . nystatin (MYCOSTATIN/NYSTOP) topical powder 1 Bottle  1 Bottle Topical Daily Rai, Ripudeep K, MD   1 Bottle at 11/10/19 1032  . polyethylene glycol (MIRALAX / GLYCOLAX) packet 17 g  17 g Oral Daily Rai, Ripudeep K, MD   17 g at 11/10/19 1031  . senna (SENOKOT) tablet 8.6 mg  1 tablet Oral Daily Rai, Ripudeep K, MD   8.6 mg at 11/10/19 1016  . sodium chloride 0.9 % bolus 1,000 mL  1,000 mL Intravenous Once Rai, Ripudeep K, MD      . thiamine (VITAMIN B-1) tablet 100 mg  100 mg Oral Daily Rai, Ripudeep K, MD   100 mg at 11/10/19 1015  . vitamin C (ASCORBIC ACID) tablet 500 mg  500 mg Oral Daily Rai, Ripudeep K, MD   500 mg at 11/10/19 1031  . zinc sulfate capsule 220 mg  220 mg Oral Daily Rai, Ripudeep K, MD   220 mg at 11/10/19 1015     Psychiatric Specialty Exam: Physical Exam  Review of Systems  Blood pressure (!) 109/52, pulse 93, temperature 100.2 F (37.9 C), temperature source Oral, resp. rate (!) 25, height 6' (1.829 m), weight 113 kg, SpO2 96 %.Body mass index is 33.79 kg/m.  General Appearance: Disheveled  Eye Contact:  None  Speech:  Described as incoherent  Volume:  Increased  Mood:  Labile or sedate  Affect:  Constricted  Thought Process:  Disorganized  Orientation:  Other:  Oriented to person only according to staff  Thought Content:  Illogical and Disorganized  Suicidal Thoughts:  No  Homicidal Thoughts:  No  Memory:  NA  Judgement:  Impaired  Insight:  Lacking  Psychomotor Activity:  Increased  Concentration:  Concentration: Poor  Recall:  Poor  Fund of  Knowledge:  Poor  Language:  Poor  Akathisia:  No  Handed:  Right  AIMS (  if indicated):     Assets:  Resilience  ADL's:  Impaired  Cognition:  Impaired,  Severe  Sleep:        Treatment Plan Summary: Plan Even though we have made several adjustments, given the half-life of these agents across the blood-brain barrier it may be a day or 2 more before he begins to settle out from these adjustments, in the meantime we will continue clozapine at a lower dose and add clonazepam to keep him calm in the day and at night and adjust as we follow   Johnn Hai, MD 11/11/2019 10:13 AM

## 2019-11-12 DIAGNOSIS — R338 Other retention of urine: Secondary | ICD-10-CM

## 2019-11-12 DIAGNOSIS — F209 Schizophrenia, unspecified: Secondary | ICD-10-CM

## 2019-11-12 LAB — PROCALCITONIN: Procalcitonin: 5.53 ng/mL

## 2019-11-12 LAB — COMPREHENSIVE METABOLIC PANEL
ALT: 29 U/L (ref 0–44)
AST: 39 U/L (ref 15–41)
Albumin: 2.4 g/dL — ABNORMAL LOW (ref 3.5–5.0)
Alkaline Phosphatase: 76 U/L (ref 38–126)
Anion gap: 6 (ref 5–15)
BUN: 53 mg/dL — ABNORMAL HIGH (ref 8–23)
CO2: 24 mmol/L (ref 22–32)
Calcium: 8.5 mg/dL — ABNORMAL LOW (ref 8.9–10.3)
Chloride: 113 mmol/L — ABNORMAL HIGH (ref 98–111)
Creatinine, Ser: 2.48 mg/dL — ABNORMAL HIGH (ref 0.61–1.24)
GFR calc Af Amer: 30 mL/min — ABNORMAL LOW (ref >60)
GFR calc non Af Amer: 26 mL/min — ABNORMAL LOW (ref >60)
Glucose, Bld: 142 mg/dL — ABNORMAL HIGH (ref 70–99)
Potassium: 4 mmol/L (ref 3.5–5.1)
Sodium: 143 mmol/L (ref 135–145)
Total Bilirubin: 0.3 mg/dL (ref 0.3–1.2)
Total Protein: 5.5 g/dL — ABNORMAL LOW (ref 6.5–8.1)

## 2019-11-12 LAB — URINE CULTURE: Culture: <10000 — AB

## 2019-11-12 LAB — CBC
HCT: 33.1 % — ABNORMAL LOW (ref 39.0–52.0)
Hemoglobin: 9.6 g/dL — ABNORMAL LOW (ref 13.0–17.0)
MCH: 31.8 pg (ref 26.0–34.0)
MCHC: 29 g/dL — ABNORMAL LOW (ref 30.0–36.0)
MCV: 109.6 fL — ABNORMAL HIGH (ref 80.0–100.0)
Platelets: 132 10*3/uL — ABNORMAL LOW (ref 150–400)
RBC: 3.02 MIL/uL — ABNORMAL LOW (ref 4.22–5.81)
RDW: 15.2 % (ref 11.5–15.5)
WBC: 18.9 10*3/uL — ABNORMAL HIGH (ref 4.0–10.5)
nRBC: 0 % (ref 0.0–0.2)

## 2019-11-12 LAB — CERULOPLASMIN: Ceruloplasmin: 31.6 mg/dL — ABNORMAL HIGH (ref 16.0–31.0)

## 2019-11-12 MED ORDER — ORAL CARE MOUTH RINSE
15.0000 mL | Freq: Two times a day (BID) | OROMUCOSAL | Status: DC
  Administered 2019-11-12 – 2019-11-28 (×27): 15 mL via OROMUCOSAL

## 2019-11-12 MED ORDER — LEVOTHYROXINE SODIUM 100 MCG PO TABS
100.0000 ug | ORAL_TABLET | Freq: Every day | ORAL | Status: DC
  Administered 2019-11-13 – 2019-11-27 (×11): 100 ug via ORAL
  Filled 2019-11-12 (×14): qty 1

## 2019-11-12 MED ORDER — ENOXAPARIN SODIUM 40 MG/0.4ML ~~LOC~~ SOLN
40.0000 mg | Freq: Every day | SUBCUTANEOUS | Status: DC
  Administered 2019-11-12 – 2019-11-27 (×16): 40 mg via SUBCUTANEOUS
  Filled 2019-11-12 (×16): qty 0.4

## 2019-11-12 MED ORDER — LIP MEDEX EX OINT
TOPICAL_OINTMENT | CUTANEOUS | Status: AC
  Administered 2019-11-12: 1
  Filled 2019-11-12: qty 7

## 2019-11-12 NOTE — Progress Notes (Signed)
PROGRESS NOTE    [name]  IWP:[id number] DOB: [birth year] DOA: 10/25/2019 PCP: [name], FNP    Brief Narrative:   Patient is a 68 year old male, obese, with PMH significant for paranoid schizophrenia, prolonged QT interval, non-Hodgkin's lymphoma, hypertension, hyperlipidemia, COPD, diastolic congestive heart failure and chronic kidney disease. Patient lives in a group home and on12/9 patient was sent to the ED for worsening shortness of breath, fever chills and low O2 sat at 89%.   In the ED, patient was started on O2 by nasal cannula. Work-up showed elevated creatinine to 3.09 (up from 1.67). COVID-19 PCR was positive. Chest x-ray revealed multifocal pneumonia.  Patient was admitted to hospital service for further evaluation and management.  Was initially hospitalized for Covid pneumonia for which he completed the treatment. His hospital course was complicated by AKI, acute urinary retention.  Patient completed a course of Decadron, IV remdesivir.  He continues to be encephalopathic with intermittent agitation and drowsiness, currently awaiting SNF placement.   Assessment & Plan:   Principal Problem:   Schizophrenia ([place]) Active Problems:   Pneumonia due to severe acute respiratory syndrome coronavirus 2 (SARS-CoV-2)   Acute Hypoxic Respiratory Failure due to Acute Covid 19 Viral Pneumonia during the ongoing 2020 Covid 19 Pandemic - POA Patient presenting with hypoxia on presentation with oxygen saturations 89%.  Covid-19 PCR positive with chest x-ray notable for multifocal pneumonia with increased inflammatory markers.  CT abdomen/pelvis on 11/10/2019 with nodular appearance right lung base with recommendations of surveillance and follow-up noncontrast CT in 6-8 weeks.  Patient completed a 5-day course of remdesivir on 10/29/2019 and a 10-day course of Decadron on 11/03/2019. --Continue supplemental oxygen, titrate for goal SPO2 greater than 92%  Acute kidney  injury on CKD stage IV, sepsis with shock Presenting with a creatinine of 3.09, which is up from his baseline of 1.67.  Renal ultrasound unremarkable with no hydronephrosis or obstruction.  On 11/10/2019, patient with fever and white blood cell count up to 47.2 with a procalcitonin of 22.18.  CT abdomen/pelvis notable for a malpositioned Foley catheter with potential cystitis, a sending urinary tract infection versus prostatitis. --Foley catheter replaced  --WBC 47.2-->37.1-->18.9 --PCT 22.18-->9.75-->5.53 --Cr 4.34-->3.49-->2.48 --Urine culture with no significant growth --Blood cultures x2: No growth x2 days --Continue IV cefepime --Continue to closely monitor urine output, renal function, WBC count, and PCT daily  Acute metabolic encephalopathy with intermittent hallucinations/agitation, history of schizoaffective disorder On multiple psych meds clozapine, benztropine, Thorazine, Lamictal, Lexapro at home.  Patient was confused and more somnolent during his hospital course.  Psychiatry was consulted with recommendations to discontinue Cogentin, Thorazine, Lamictal. --clozapine 100 mg qHS --Lexapro 10 mg p.o. daily --Clonazepam 0.5 mg twice daily and 1mg  qHS  Hypernatremia Sodium trending up, 148, likely 2/2 poor p.o. intake due to somnolence and agitation. --Na 148-->143 --continue D5W at 100 cc/hour until oral intake improves.  Acute renal urinary retention Patient had Foley catheter placed on admission for acute urinary retention.  Attempt at removal on 1216 with recurrent urinary retention.  On 11/10/2019 patient with dislodgment of his catheter which was once again replaced. --Will need repeat voiding trial in the next 1-2 weeks when mental status improves.  Drug reaction/fungal infection Currently on lower extremities, left flank, axilla, groin area --continue nystatin topical  Hyperkalemia Resolved  QTC prolongation QTC 478 on EKG 12/20 --Keep potassium> 4, magnesium>  2 --follow BMP daily  Chronic diastolic CHF, compensated 2D echo in 09/2018 showed EF of 65 to 70% with  LV diastolic dysfunction  Hyperlipidemia Continue Lipitor  Hypothyroidism --Continue levothyroxine 100 mcg p.o. daily  Morbid obesity  BMI 33.9, patient advised diet and weight control  pressure injury Stage II, left buttocks, present on admission --Wound care per nursing   DVT prophylaxis: Lovenox Code Status: Full code Family Communication: None present at bedside Disposition Plan: Pending SNF placement, difficult secondary to his underlying psychiatric illness, social work for coordination   Consultants:   none  Procedures:   none  Antimicrobials:   Cefepime 12/25>>  Remdesivir 12/9 - 12/13  Doxycycline 12/9 - 12/14  Ceftriaxone 12/9 - 12/14    Subjective: Patient seen and examined at bedside, remains confused.  Present at bedside.  No other obvious complaints this morning.  No acute concerns overnight per nursing staff.  Objective: Vitals:   11/12/19 0420 11/12/19 0600 11/12/19 0800 11/12/19 0853  BP:  118/66 (!) 125/57   Pulse:  95 97   Resp:  18 (!) 23   Temp: 97.9 F (36.6 C)   98.1 F (36.7 C)  TempSrc: Axillary   Oral  SpO2:  99% 96%   Weight:      Height:        Intake/Output Summary (Last 24 hours) at 11/12/2019 1107 Last data filed at 11/12/2019 0830 Gross per 24 hour  Intake 1216.45 ml  Output 750 ml  Net 466.45 ml   Filed Weights   10/26/19 1812 11/07/19 0939 11/08/19 1324  Weight: (!) 183.9 kg 113.4 kg 113 kg    Examination:  General exam: Confused, no acute distress Respiratory system: Clear to auscultation. Respiratory effort normal. Cardiovascular system: S1 & S2 heard, RRR. No JVD, murmurs, rubs, gallops or clicks. No pedal edema. Gastrointestinal system: Abdomen is nondistended, soft and nontender. No organomegaly or masses felt. Normal bowel sounds heard. Central nervous system: Alert and oriented. No focal  neurological deficits. Extremities: Symmetric 5 x 5 power. Skin: No rashes, lesions or ulcers Psychiatry: Confused, poor judgment/insight    Data Reviewed: I have personally reviewed following labs and imaging studies  CBC: Recent Labs  Lab 11/07/19 0326 11/10/19 1235 11/10/19 1730 11/11/19 0303 11/12/19 0235  WBC 14.3* 47.2* 43.2* 37.1* 18.9*  NEUTROABS 11.3*  --  36.9*  --   --   HGB 12.7* 11.8* 11.6* 10.6* 9.6*  HCT 38.9* 38.4* 38.2* 35.7* 33.1*  MCV 96.0 102.4* 105.5* 105.0* 109.6*  PLT 155 148* 137* 144* 540*   Basic Metabolic Panel: Recent Labs  Lab 11/07/19 0326 11/10/19 1235 11/10/19 1730 11/11/19 0900 11/12/19 0235  NA 141 146* 145 148* 143  K 4.6 4.7 4.7 4.4 4.0  CL 104 108 109 112* 113*  CO2 28 26 24 27 24   GLUCOSE 115* 214* 190* 162* 142*  BUN 38* 70* 67* 62* 53*  CREATININE 1.83* 4.28* 4.34* 3.49* 2.48*  CALCIUM 9.6 9.3 9.1 8.8* 8.5*   GFR: Estimated Creatinine Clearance: 38 mL/min (A) (by C-G formula based on SCr of 2.48 mg/dL (H)). Liver Function Tests: Recent Labs  Lab 11/10/19 1235 11/10/19 1730 11/11/19 0900 11/12/19 0235  AST 96* 71* 50* 39  ALT 33 28 29 29   ALKPHOS 74 73 121 76  BILITOT 1.0 0.6 0.6 0.3  PROT 6.0* 5.9* 6.0* 5.5*  ALBUMIN 3.1* 2.9* 2.7* 2.4*   No results for input(s): LIPASE, AMYLASE in the last 168 hours. Recent Labs  Lab 11/10/19 1236 11/11/19 0900  AMMONIA 25 25   Coagulation Profile: No results for input(s): INR, PROTIME in the last 168  hours. Cardiac Enzymes: No results for input(s): CKTOTAL, CKMB, CKMBINDEX, TROPONINI in the last 168 hours. BNP (last 3 results) No results for input(s): PROBNP in the last 8760 hours. HbA1C: No results for input(s): HGBA1C in the last 72 hours. CBG: No results for input(s): GLUCAP in the last 168 hours. Lipid Profile: No results for input(s): CHOL, HDL, LDLCALC, TRIG, CHOLHDL, LDLDIRECT in the last 72 hours. Thyroid Function Tests: Recent Labs    11/10/19 1300  TSH  1.038   Anemia Panel: Recent Labs    11/10/19 1236  VITAMINB12 425   Sepsis Labs: Recent Labs  Lab 11/10/19 1730 11/11/19 0900 11/12/19 0235  PROCALCITON 22.18 9.75 5.53  LATICACIDVEN 1.4  --   --     Recent Results (from the past 240 hour(s))  Culture, blood (routine x 2)     Status: None (Preliminary result)   Collection Time: 11/10/19  5:30 PM   Specimen: BLOOD RIGHT HAND  Result Value Ref Range Status   Specimen Description BLOOD RIGHT HAND  Final   Special Requests   Final    BOTTLES DRAWN AEROBIC ONLY Blood Culture adequate volume   Culture   Final    NO GROWTH 2 DAYS Performed at [facility], [place] [address], [place], [place] [address]    Report Status PENDING  Incomplete  Culture, blood (routine x 2)     Status: None (Preliminary result)   Collection Time: 11/10/19  5:30 PM   Specimen: BLOOD RIGHT ARM  Result Value Ref Range Status   Specimen Description   Final    BLOOD RIGHT ARM Performed at [facility], [place] [address], [place], [place] [address]    Special Requests   Final    BOTTLES DRAWN AEROBIC AND ANAEROBIC Blood Culture adequate volume Performed at [place] [address] [place], [place] [address]    Culture   Final    NO GROWTH 2 DAYS Performed at [facility] [address], [place], [place] [address]    Report Status PENDING  Incomplete  MRSA PCR Screening     Status: None   Collection Time: 11/10/19  9:09 PM   Specimen: Nasal Mucosa; Nasopharyngeal  Result Value Ref Range Status   MRSA by PCR NEGATIVE NEGATIVE Final    Comment:        The GeneXpert MRSA Assay (FDA approved for NASAL specimens only), is one component of a comprehensive MRSA colonization surveillance program. It is not intended to diagnose MRSA infection nor to guide or monitor treatment for MRSA infections. Performed at [facility], [place] [address], [place], [place] [address]   Urine culture     Status:  Abnormal   Collection Time: 11/11/19  2:44 AM   Specimen: Urine, Random  Result Value Ref Range Status   Specimen Description   Final    URINE, RANDOM Performed at [facility], [address] [place], [place] [address]    Special Requests   Final    NONE Performed at [facility], [place] [address], [place], [place] [address]    Culture (A)  Final    <10,000 COLONIES/mL INSIGNIFICANT GROWTH Performed at [facility] [address], [place],  [address]    Report Status 11/12/2019 FINAL  Final         Radiology Studies: CT ABDOMEN PELVIS WO CONTRAST  Addendum Date: 11/11/2019   ADDENDUM REPORT: 11/11/2019 02:38 ADDENDUM:  These results were called by telephone on 11/11/2019 at 2:38 am to provider Lang Snow , who verbally acknowledged these results. Electronically Signed   By: Lovena Le M.D.   On: 11/11/2019 02:38   Result Date: 11/11/2019 CLINICAL DATA:  Abdominal pain, acute renal failure and sepsis. EXAM: CT ABDOMEN AND PELVIS WITHOUT CONTRAST TECHNIQUE: Multidetector CT imaging of the abdomen and pelvis was performed following the standard protocol without IV contrast. COMPARISON:  Radiograph 11/10/2019, renal ultrasound 36/64/4034, CT renal colic 74/25/9563 FINDINGS: Lower chest: There mixed patchy opacities in the lung bases. Slightly nodular appearance of consolidation in the right lower lobe warrants continued surveillance to resolution. Trace effusions are present. Some bandlike areas of opacity likely reflect atelectasis and/or scarring Hepatobiliary: No focal liver abnormality is seen. No gallstones, gallbladder wall thickening, or biliary dilatation. Pancreas: Unremarkable. No pancreatic ductal dilatation or surrounding inflammatory changes. Spleen: Mild splenomegaly.  No focal splenic lesions. Adrenals/Urinary Tract: Normal adrenal glands. Mild bilateral renal atrophy. No visible concerning renal lesions. Mild bilateral  perinephric stranding similar to comparison study. Slightly asymmetric right periureteral stranding is noted as well as circumferential bladder wall thickening and few foci of anti dependent gas. A Foley catheter is positioned with the tip in the urinary bladder and inflated balloon within the level of the prostatic urethra. No visible urolithiasis or hydronephrosis. Stomach/Bowel: Distal esophagus, stomach and duodenal sweep are unremarkable. No small bowel wall thickening or dilatation. No evidence of obstruction. A normal appendix is visualized. No colonic dilatation or wall thickening. Vascular/Lymphatic: Atherosclerotic plaque within the normal caliber aorta. No suspicious or enlarged lymph nodes in the included lymphatic chains. Reproductive: Inflated Foley catheter balloon in the mid prostate. Hazy stranding is also seen about the prostate. Coarse eccentric prostate calcifications are noted. Mild prostatomegaly is evident. Other: Few foci of subcutaneous gas in the right lower quadrant may be related to injectable use. No free fluid or free air in the abdomen or pelvis. Perivesicular hazy stranding is noted. Musculoskeletal: Multilevel degenerative changes are present in the imaged portions of the spine. No acute osseous abnormality or suspicious osseous lesion. IMPRESSION: 1. Malpositioned Foley catheter with the inflated balloon at the level of the prostatic urethra. 2. Circumferential bladder wall thickening and some Peri ureteral stranding are worrisome for potential cystitis and ascending urinary tract infection. Intraluminal bladder gas may be related to Foley instrumentation. 3. Additional phlegmonous changes seen about the prostate compatible with prostatitis. No discernible abscess or collection. 4. Mixed interstitial airspace opacities in the lung bases compatible with atypical infection. More nodular appearance of disease in the right lung base may warrant imaging surveillance to completion.  Consider follow-up noncontrast CT in 6-8 weeks to ensure resolution. Currently attempting to contact the ordering provider and/or care team. Addendum will be submitted upon case discussion. Electronically Signed: By: Lovena Le M.D. On: 11/11/2019 02:32   CT HEAD WO CONTRAST  Result Date: 11/11/2019 CLINICAL DATA:  Neurologic deficits.  Altered mental status. EXAM: CT HEAD WITHOUT CONTRAST TECHNIQUE: Contiguous axial images were obtained from the base of the skull through the vertex without intravenous contrast. COMPARISON:  12/26/2018 FINDINGS: Brain: Ventricles, cisterns and other CSF spaces are within normal. There is no mass, mass effect, shift of midline structures or acute hemorrhage. No evidence of acute infarction. Vascular: No hyperdense vessel or unexpected calcification. Skull: Normal. Negative for fracture or focal lesion. Sinuses/Orbits: No acute finding. Other: None. IMPRESSION: No acute findings. Electronically Signed   By: Marin Olp M.D.   On:  11/11/2019 11:28   DG CHEST PORT 1 VIEW  Result Date: 11/10/2019 CLINICAL DATA:  Sepsis. Multifocal pneumonia. History of non-Hodgkin's lymphoma. EXAM: PORTABLE CHEST 1 VIEW COMPARISON:  Radiographs 10/25/2019 and 12/26/2018. CT 02/06/2011. FINDINGS: 1656 hours. The heart size and mediastinal contours are stable. Widening of the superior mediastinum appears similar to the recent prior studies, probably reflecting mediastinal fat. There are persistent low lung volumes with patchy right greater than left perihilar and lower lobe airspace opacities, again concerning for atypical infection. No pneumothorax or significant pleural effusion. The bones appear unchanged. IMPRESSION: 1. No significant change in patchy right greater than left perihilar and lower lobe airspace opacities, concerning for atypical infection. 2. Stable mediastinal widening, likely due to mediastinal fat and suboptimal inspiration. Correlation with at least two view radiographs  recommended when the patient is able. Electronically Signed   By: Richardean Sale M.D.   On: 11/10/2019 17:14        Scheduled Meds: . aspirin EC  81 mg Oral Daily  . atorvastatin  20 mg Oral q1800  . Chlorhexidine Gluconate Cloth  6 each Topical Daily  . cholecalciferol  5,000 Units Oral Daily  . clonazepam  0.5 mg Oral BID  . clonazepam  1 mg Oral QHS  . clotrimazole   Topical BID  . cloZAPine  100 mg Oral QHS  . docusate sodium  100 mg Oral Daily  . enoxaparin (LOVENOX) injection  30 mg Subcutaneous QHS  . escitalopram  10 mg Oral QAC breakfast  . feeding supplement (ENSURE ENLIVE)  237 mL Oral BID BM  . flavoxATE  100 mg Oral TID  . fluticasone  1 puff Inhalation BID  . folic acid  1 mg Oral Daily  . Ipratropium-Albuterol  1 puff Inhalation TID  . levothyroxine  50 mcg Intravenous Daily  . mouth rinse  15 mL Mouth Rinse BID  . multivitamin with minerals  1 tablet Oral Daily  . nystatin  1 Bottle Topical Daily  . polyethylene glycol  17 g Oral Daily  . senna  1 tablet Oral Daily  . thiamine  100 mg Oral Daily  . vitamin C  500 mg Oral Daily  . zinc sulfate  220 mg Oral Daily   Continuous Infusions: . ceFEPime (MAXIPIME) IV 2 g (11/12/19 0948)  . dextrose 100 mL/hr at 11/12/19 0433  . sodium chloride       LOS: 18 days    Time spent: 39 minutes spent on chart review, discussion with nursing staff, consultants, updating family and interview/physical exam; more than 50% of that time was spent in counseling and/or coordination of care.    Sol Odor J [place] DO [facility] 11/12/2019, 11:07 AM

## 2019-11-13 DIAGNOSIS — N419 Inflammatory disease of prostate, unspecified: Secondary | ICD-10-CM | POA: Diagnosis not present

## 2019-11-13 DIAGNOSIS — N139 Obstructive and reflux uropathy, unspecified: Secondary | ICD-10-CM | POA: Diagnosis not present

## 2019-11-13 LAB — COMPREHENSIVE METABOLIC PANEL
ALT: 33 U/L (ref 0–44)
AST: 40 U/L (ref 15–41)
Albumin: 2.5 g/dL — ABNORMAL LOW (ref 3.5–5.0)
Alkaline Phosphatase: 88 U/L (ref 38–126)
Anion gap: 9 (ref 5–15)
BUN: 40 mg/dL — ABNORMAL HIGH (ref 8–23)
CO2: 25 mmol/L (ref 22–32)
Calcium: 9 mg/dL (ref 8.9–10.3)
Chloride: 111 mmol/L (ref 98–111)
Creatinine, Ser: 2.09 mg/dL — ABNORMAL HIGH (ref 0.61–1.24)
GFR calc Af Amer: 37 mL/min — ABNORMAL LOW (ref >60)
GFR calc non Af Amer: 32 mL/min — ABNORMAL LOW (ref >60)
Glucose, Bld: 138 mg/dL — ABNORMAL HIGH (ref 70–99)
Potassium: 5.1 mmol/L (ref 3.5–5.1)
Sodium: 145 mmol/L (ref 135–145)
Total Bilirubin: 1.2 mg/dL (ref 0.3–1.2)
Total Protein: 5.6 g/dL — ABNORMAL LOW (ref 6.5–8.1)

## 2019-11-13 LAB — PROCALCITONIN: Procalcitonin: 3.67 ng/mL

## 2019-11-13 LAB — CBC
HCT: 36.5 % — ABNORMAL LOW (ref 39.0–52.0)
Hemoglobin: 10.8 g/dL — ABNORMAL LOW (ref 13.0–17.0)
MCH: 31.5 pg (ref 26.0–34.0)
MCHC: 29.6 g/dL — ABNORMAL LOW (ref 30.0–36.0)
MCV: 106.4 fL — ABNORMAL HIGH (ref 80.0–100.0)
Platelets: 120 10*3/uL — ABNORMAL LOW (ref 150–400)
RBC: 3.43 MIL/uL — ABNORMAL LOW (ref 4.22–5.81)
RDW: 14.8 % (ref 11.5–15.5)
WBC: 12.5 10*3/uL — ABNORMAL HIGH (ref 4.0–10.5)
nRBC: 0 % (ref 0.0–0.2)

## 2019-11-13 LAB — LAMOTRIGINE LEVEL: Lamotrigine Lvl: 2.9 ug/mL (ref 2.0–20.0)

## 2019-11-13 LAB — GLUCOSE, CAPILLARY: Glucose-Capillary: 121 mg/dL — ABNORMAL HIGH (ref 70–99)

## 2019-11-13 NOTE — Progress Notes (Signed)
PT Cancellation Note  Patient Details Name: [name] MRN: [id number] DOB: 03/21/1951   Cancelled Treatment:    Reason Eval/Treat Not Completed: Fatigue/lethargy limiting ability to participate(pt is sleeping soundly, per sitter, pt has been disoriented and slept very little last night, sitter stated pt is not arousable at present. Will follow.)   Philomena Doheny PT 11/13/2019  Acute Rehabilitation Services Pager [phone] Office [phone]

## 2019-11-13 NOTE — Progress Notes (Signed)
Nutrition Follow-up  RD working remotely.   DOCUMENTATION CODES:   Not applicable  INTERVENTION:  - continue Ensure Enlive BID, each supplement provides 350 kcal and 20 grams of protein. - will order Magic Cup BID with meals, each supplement provides 290 kcal and 9 grams of protein.    NUTRITION DIAGNOSIS:   Increased nutrient needs related to acute illness as evidenced by estimated needs. -ongoing  GOAL:   Patient will meet greater than or equal to 90% of their needs -unmet  MONITOR:   PO intake, Supplement acceptance, Labs, Weight trends  ASSESSMENT:   68 year old male with medical history of obesity, paranoid schizophrenia, prolonged QT interval, non-Hodgkin's lymphoma, HTN, hyperlipidemia, COPD, CHF, and CKD. He lives in a group home. He was sent to the ED on 12/9 for worsening SOB, fever, chills, and low O2 sat (89%). COVID-19 PCR was positive and CXR showed multifocal PNA. He has completed treatment for COVID; hospital course complicated by AKI, acute urinary retention, and drug reaction vs fungal infection to groin, L axilla, and L flank.  Able to talk with RN a short time ago and he reports that for breakfast patient ate 75% of applesauce, 50% of fruit cup, and 50% of Ensure supplement. Per review of order, he has accepted Ensure ~90% of the time offered. Per flow sheet documentation, he has most recently consumed the following at meals:  12/23- 40% of dinner 12/24- 0% of all meals 12/25- 0% of breakfast 12/27- 50% of breakfast  Per chart review, weight stable 12/22-12/23 and no weight since that date. Patient has not had a BM since 12/25.   Per notes: - [id number] PNA--plan to d/c precautions on 12/30 which is 21 days from positive COVID test on 12/9 - AKI on CKD stage 4 - acute metabolic encephalopathy with intermittent hallucinations and agitation--plan for re-consult to Psych - plan to transfer out of SDU today (12/28) and plan for SNF at time of d/c.   Labs  reviewed; BUN: 40 mg/dl, creatinine: 2.09 mg/dl, GFR: 32 ml/min. Medications reviewed; 50001 units cholecalciferol/day, 100 mg colace/day, 1 mg folvite/day, 100 mcg oral synthroid/day, daily multivitamin with minerals, 1 packet miralax/day, 100 mg oral thiamine/day, 500 mg ascorbic acid/day, 220 mg zinc sulfate/day.  IVF; D5 @ 125 ml/hr (510 kcal).     NUTRITION - FOCUSED PHYSICAL EXAM:  unable to complete for COVID+ patient.  Diet Order:   Diet Order            Diet Heart Room service appropriate? Yes; Fluid consistency: Thin  Diet effective now              EDUCATION NEEDS:   No education needs have been identified at this time  Skin:  Skin Assessment: Reviewed RN Assessment  Last BM:  12/25  Height:   Ht Readings from Last 1 Encounters:  10/26/19 6' (1.829 m)    Weight:   Wt Readings from Last 1 Encounters:  11/08/19 113 kg    Ideal Body Weight:  80.9 kg  BMI:  Body mass index is 33.79 kg/m.  Estimated Nutritional Needs:   Kcal:  1478-2956 kcal  Protein:  110-120 grams  Fluid:  >/= 2.2 L/day      Jarome Matin, MS, RD, LDN, [facility] Inpatient Clinical Dietitian Pager # [phone] After hours/weekend pager # [phone]

## 2019-11-13 NOTE — Progress Notes (Signed)
PROGRESS NOTE    OAKLAN PERSONS  IWP:[id number] DOB: 02-Sep-1951 DOA: 10/25/2019 PCP: [name], FNP    Brief Narrative:   Patient is a 68 year old male, obese, with PMH significant for paranoid schizophrenia, prolonged QT interval, non-Hodgkin's lymphoma, hypertension, hyperlipidemia, COPD, diastolic congestive heart failure and chronic kidney disease. Patient lives in a group home and on12/9 patient was sent to the ED for worsening shortness of breath, fever chills and low O2 sat at 89%.   In the ED, patient was started on O2 by nasal cannula. Work-up showed elevated creatinine to 3.09 (up from 1.67). COVID-19 PCR was positive. Chest x-ray revealed multifocal pneumonia.  Patient was admitted to hospital service for further evaluation and management.  Was initially hospitalized for Covid pneumonia for which he completed the treatment. His hospital course was complicated by AKI, acute urinary retention.  Patient completed a course of Decadron, IV remdesivir.  He continues to be encephalopathic with intermittent agitation and drowsiness, currently awaiting SNF placement.   Assessment & Plan:   Principal Problem:   Schizophrenia ([place]) Active Problems:   Pneumonia due to severe acute respiratory syndrome coronavirus 2 (SARS-CoV-2)   Acute Hypoxic Respiratory Failure due to Acute Covid 19 Viral Pneumonia during the ongoing 2020 Covid 19 Pandemic - POA Patient presenting with hypoxia on presentation with oxygen saturations 89%.  Covid-19 PCR positive with chest x-ray notable for multifocal pneumonia with increased inflammatory markers.  CT abdomen/pelvis on 11/10/2019 with nodular appearance right lung base with recommendations of surveillance and follow-up noncontrast CT in 6-8 weeks.  Patient completed a 5-day course of remdesivir on 10/29/2019 and a 10-day course of Decadron on 11/03/2019. --Continue supplemental oxygen, titrate for goal SPO2 greater than 92% --plan to  discontinue contact/airborne isolation precautions 11/15/2019; as this will be 21 days from his positive Covid test on 10/25/2019.  Acute kidney injury on CKD stage IV sepsis with shock likely 2/2 UTI versus prostatitis Presenting with a creatinine of 3.09, which is up from his baseline of 1.67.  Renal ultrasound unremarkable with no hydronephrosis or obstruction.  On 11/10/2019, patient with fever and white blood cell count up to 47.2 with a procalcitonin of 22.18.  CT abdomen/pelvis notable for a malpositioned Foley catheter with potential cystitis, a sending urinary tract infection versus prostatitis. --Foley catheter replaced  --WBC 47.2-->37.1-->18.9-->12.5 --PCT 22.18-->9.75-->5.53-->3.67 --Cr 4.34-->3.49-->2.48-->2.09 --Urine culture with no significant growth --Blood cultures x2: No growth x2 days --Continue IV cefepime --Continue to closely monitor urine output, renal function, WBC count, and PCT daily  Acute metabolic encephalopathy with intermittent hallucinations/agitation, history of schizoaffective disorder On multiple psych meds clozapine, benztropine, Thorazine, Lamictal, Lexapro at home.  Patient was confused and more somnolent during his hospital course.  Psychiatry was consulted with recommendations to discontinue Cogentin, Thorazine, Lamictal. --clozapine 100 mg qHS --Lexapro 10 mg p.o. daily --Clonazepam 0.5 mg twice daily and 1mg  qHS --Will reconsult psychiatry today for assistance with further adjustments in his antipsychotics as he continues with agitation and pulling at medical devices  Hypernatremia Sodium trending up, 148, likely 2/2 poor p.o. intake due to somnolence and agitation. --Na 148-->143-->145 --increase D5W to 125 cc/hour  --monitor sodium levels daily  Acute renal urinary retention Patient had Foley catheter placed on admission for acute urinary retention.  Attempt at removal on 1216 with recurrent urinary retention.  On 11/10/2019 patient with  dislodgment of his catheter which was once again replaced. --Will need repeat voiding trial in the next 1-2 weeks when mental status improves.  Drug reaction/fungal  infection Currently on lower extremities, left flank, axilla, groin area --continue nystatin topical  Hyperkalemia Resolved  QTC prolongation QTC 478 on EKG 12/20 --Keep potassium> 4, magnesium> 2 --follow BMP daily  Chronic diastolic CHF, compensated 2D echo in 09/2018 showed EF of 65 to 57% with LV diastolic dysfunction  Hyperlipidemia Continue Lipitor  Hypothyroidism --Continue levothyroxine 100 mcg p.o. daily  Morbid obesity  BMI 33.9, patient advised diet and weight control  pressure injury Stage II, left buttocks, present on admission --Wound care per nursing   DVT prophylaxis: Lovenox Code Status: Full code Family Communication: None present at bedside Disposition Plan: Transfer to [facility] today; pending SNF placement, difficult secondary to his underlying psychiatric illness, social work for coordination   Consultants:   none  Procedures:   none  Antimicrobials:   Cefepime 12/25>>  Remdesivir 12/9 - 12/13  Doxycycline 12/9 - 12/14  Ceftriaxone 12/9 - 12/14    Subjective: Patient seen and examined at bedside, remains confused.  Sitter present at bedside.  States he is thirsty.  No other obvious complaints this morning.  Slept only 2 hours per nursing report.  Continues to pull out medical devices.  No other acute concerns overnight per nursing staff.  Objective: Vitals:   11/13/19 0700 11/13/19 0800 11/13/19 0900 11/13/19 1000  BP:  131/83  (!) 142/76  Pulse: 89 92 87 91  Resp: (!) 21 18 17 14   Temp:      TempSrc:      SpO2: 100% 99% 100% 100%  Weight:      Height:        Intake/Output Summary (Last 24 hours) at 11/13/2019 1019 Last data filed at 11/13/2019 0600 Gross per 24 hour  Intake 1200 ml  Output 1150 ml  Net 50 ml   Filed Weights   10/26/19 1812  11/07/19 0939 11/08/19 1324  Weight: (!) 183.9 kg 113.4 kg 113 kg    Examination:  General exam: Confused, no acute distress Respiratory system: Clear to auscultation. Respiratory effort normal. Cardiovascular system: S1 & S2 heard, RRR. No JVD, murmurs, rubs, gallops or clicks. No pedal edema. Gastrointestinal system: Abdomen is nondistended, soft and nontender. No organomegaly or masses felt. Normal bowel sounds heard. Central nervous system: Alert and oriented. No focal neurological deficits. Extremities: Symmetric 5 x 5 power. Skin: No rashes, lesions or ulcers Psychiatry: Confused, poor judgment/insight    Data Reviewed: I have personally reviewed following labs and imaging studies  CBC: Recent Labs  Lab 11/07/19 0326 11/10/19 1235 11/10/19 1730 11/11/19 0303 11/12/19 0235 11/13/19 0239  WBC 14.3* 47.2* 43.2* 37.1* 18.9* 12.5*  NEUTROABS 11.3*  --  36.9*  --   --   --   HGB 12.7* 11.8* 11.6* 10.6* 9.6* 10.8*  HCT 38.9* 38.4* 38.2* 35.7* 33.1* 36.5*  MCV 96.0 102.4* 105.5* 105.0* 109.6* 106.4*  PLT 155 148* 137* 144* 132* 017*   Basic Metabolic Panel: Recent Labs  Lab 11/10/19 1235 11/10/19 1730 11/11/19 0900 11/12/19 0235 11/13/19 0239  NA 146* 145 148* 143 145  K 4.7 4.7 4.4 4.0 5.1  CL 108 109 112* 113* 111  CO2 26 24 27 24 25   GLUCOSE 214* 190* 162* 142* 138*  BUN 70* 67* 62* 53* 40*  CREATININE 4.28* 4.34* 3.49* 2.48* 2.09*  CALCIUM 9.3 9.1 8.8* 8.5* 9.0   GFR: Estimated Creatinine Clearance: 45.1 mL/min (A) (by C-G formula based on SCr of 2.09 mg/dL (H)). Liver Function Tests: Recent Labs  Lab 11/10/19 1235 11/10/19 1730 11/11/19  0900 11/12/19 0235 11/13/19 0239  AST 96* 71* 50* 39 40  ALT 33 28 29 29  33  ALKPHOS 74 73 121 76 88  BILITOT 1.0 0.6 0.6 0.3 1.2  PROT 6.0* 5.9* 6.0* 5.5* 5.6*  ALBUMIN 3.1* 2.9* 2.7* 2.4* 2.5*   No results for input(s): LIPASE, AMYLASE in the last 168 hours. Recent Labs  Lab 11/10/19 1236 11/11/19 0900    AMMONIA 25 25   Coagulation Profile: No results for input(s): INR, PROTIME in the last 168 hours. Cardiac Enzymes: No results for input(s): CKTOTAL, CKMB, CKMBINDEX, TROPONINI in the last 168 hours. BNP (last 3 results) No results for input(s): PROBNP in the last 8760 hours. HbA1C: No results for input(s): HGBA1C in the last 72 hours. CBG: No results for input(s): GLUCAP in the last 168 hours. Lipid Profile: No results for input(s): CHOL, HDL, LDLCALC, TRIG, CHOLHDL, LDLDIRECT in the last 72 hours. Thyroid Function Tests: Recent Labs    11/10/19 1300  TSH 1.038   Anemia Panel: Recent Labs    11/10/19 1236  VITAMINB12 425   Sepsis Labs: Recent Labs  Lab 11/10/19 1730 11/11/19 0900 11/12/19 0235 11/13/19 0239  PROCALCITON 22.18 9.75 5.53 3.67  LATICACIDVEN 1.4  --   --   --     Recent Results (from the past 240 hour(s))  Culture, blood (routine x 2)     Status: None (Preliminary result)   Collection Time: 11/10/19  5:30 PM   Specimen: BLOOD RIGHT HAND  Result Value Ref Range Status   Specimen Description BLOOD RIGHT HAND  Final   Special Requests   Final    BOTTLES DRAWN AEROBIC ONLY Blood Culture adequate volume   Culture   Final    NO GROWTH 2 DAYS Performed at [facility], [place] [address], [place], [place] [address]    Report Status PENDING  Incomplete  Culture, blood (routine x 2)     Status: None (Preliminary result)   Collection Time: 11/10/19  5:30 PM   Specimen: BLOOD RIGHT ARM  Result Value Ref Range Status   Specimen Description   Final    BLOOD RIGHT ARM Performed at [facility], [place] [address] [place], [place] [address]    Special Requests   Final    BOTTLES DRAWN AEROBIC AND ANAEROBIC Blood Culture adequate volume Performed at [facility] [address], [place], [place] [address]    Culture   Final    NO GROWTH 2 DAYS Performed at [facility] [address] [place], [place] [address]    Report  Status PENDING  Incomplete  MRSA PCR Screening     Status: None   Collection Time: 11/10/19  9:09 PM   Specimen: Nasal Mucosa; Nasopharyngeal  Result Value Ref Range Status   MRSA by PCR NEGATIVE NEGATIVE Final    Comment:        The GeneXpert MRSA Assay (FDA approved for NASAL specimens only), is one component of a comprehensive MRSA colonization surveillance program. It is not intended to diagnose MRSA infection nor to guide or monitor treatment for MRSA infections. Performed at [facility], [place] [address], [place], [place] [address]   Urine culture     Status: Abnormal   Collection Time: 11/11/19  2:44 AM   Specimen: Urine, Random  Result Value Ref Range Status   Specimen Description   Final    URINE, RANDOM Performed at [facility], [place]  [address] [place], [place] [address]    Special Requests   Final    NONE Performed at [facility] [address], [place], [place] [address]    Culture (A)  Final    <10,000 COLONIES/mL INSIGNIFICANT GROWTH Performed at [facility] [address] [place], [place] [address]    Report Status 11/12/2019 FINAL  Final         Radiology Studies: CT HEAD WO CONTRAST  Result Date: 11/11/2019 CLINICAL DATA:  Neurologic deficits.  Altered mental status. EXAM: CT HEAD WITHOUT CONTRAST TECHNIQUE: Contiguous axial images were obtained from the base of the skull through the vertex without intravenous contrast. COMPARISON:  12/26/2018 FINDINGS: Brain: Ventricles, cisterns and other CSF spaces are within normal. There is no mass, mass effect, shift of midline structures or acute hemorrhage. No evidence of acute infarction. Vascular: No hyperdense vessel or unexpected calcification. Skull: Normal. Negative for fracture or focal lesion. Sinuses/Orbits: No acute finding. Other: None. IMPRESSION: No acute findings. Electronically Signed   By: Marin Olp M.D.   On: 11/11/2019 11:28         Scheduled Meds: . aspirin EC  81 mg Oral Daily  . atorvastatin  20 mg Oral q1800  . Chlorhexidine Gluconate Cloth  6 each Topical Daily  . cholecalciferol  5,000 Units Oral Daily  . clonazepam  0.5 mg Oral BID  . clonazepam  1 mg Oral QHS  . clotrimazole   Topical BID  . cloZAPine  100 mg Oral QHS  . docusate sodium  100 mg Oral Daily  . enoxaparin (LOVENOX) injection  40 mg Subcutaneous QHS  . escitalopram  10 mg Oral QAC breakfast  . feeding supplement (ENSURE ENLIVE)  237 mL Oral BID BM  . flavoxATE  100 mg Oral TID  . fluticasone  1 puff Inhalation BID  . folic acid  1 mg Oral Daily  . Ipratropium-Albuterol  1 puff Inhalation TID  . levothyroxine  100 mcg Oral Q0600  . mouth rinse  15 mL Mouth Rinse BID  . multivitamin with minerals  1 tablet Oral Daily  . nystatin  1 Bottle Topical Daily  . polyethylene glycol  17 g Oral Daily  . senna  1 tablet Oral Daily  . thiamine  100 mg Oral Daily  . vitamin C  500 mg Oral Daily  . zinc sulfate  220 mg Oral Daily   Continuous Infusions: . ceFEPime (MAXIPIME) IV Stopped (11/13/19 1030)  . dextrose 125 mL/hr at 11/13/19 0956  . sodium chloride       LOS: 19 days    Time spent: 39 minutes spent on chart review, discussion with nursing staff, consultants, updating family and interview/physical exam; more than 50% of that time was spent in counseling and/or coordination of care.    Cristiano Capri J [place] DO [facility] 11/13/2019, 10:19 AM

## 2019-11-14 LAB — BASIC METABOLIC PANEL
Anion gap: 9 (ref 5–15)
BUN: 22 mg/dL (ref 8–23)
CO2: 26 mmol/L (ref 22–32)
Calcium: 9.5 mg/dL (ref 8.9–10.3)
Chloride: 108 mmol/L (ref 98–111)
Creatinine, Ser: 1.51 mg/dL — ABNORMAL HIGH (ref 0.61–1.24)
GFR calc Af Amer: 55 mL/min — ABNORMAL LOW (ref >60)
GFR calc non Af Amer: 47 mL/min — ABNORMAL LOW (ref >60)
Glucose, Bld: 161 mg/dL — ABNORMAL HIGH (ref 70–99)
Potassium: 3.9 mmol/L (ref 3.5–5.1)
Sodium: 143 mmol/L (ref 135–145)

## 2019-11-14 LAB — CBC WITH DIFFERENTIAL/PLATELET
Abs Immature Granulocytes: 0.05 10*3/uL (ref 0.00–0.07)
Basophils Absolute: 0 10*3/uL (ref 0.0–0.1)
Basophils Relative: 0 %
Eosinophils Absolute: 0.2 10*3/uL (ref 0.0–0.5)
Eosinophils Relative: 1 %
HCT: 33.6 % — ABNORMAL LOW (ref 39.0–52.0)
Hemoglobin: 10.6 g/dL — ABNORMAL LOW (ref 13.0–17.0)
Immature Granulocytes: 0 %
Lymphocytes Relative: 10 %
Lymphs Abs: 1.1 10*3/uL (ref 0.7–4.0)
MCH: 31.2 pg (ref 26.0–34.0)
MCHC: 31.5 g/dL (ref 30.0–36.0)
MCV: 98.8 fL (ref 80.0–100.0)
Monocytes Absolute: 0.8 10*3/uL (ref 0.1–1.0)
Monocytes Relative: 7 %
Neutro Abs: 9.3 10*3/uL — ABNORMAL HIGH (ref 1.7–7.7)
Neutrophils Relative %: 82 %
Platelets: 156 10*3/uL (ref 150–400)
RBC: 3.4 MIL/uL — ABNORMAL LOW (ref 4.22–5.81)
RDW: 14.2 % (ref 11.5–15.5)
WBC: 11.4 10*3/uL — ABNORMAL HIGH (ref 4.0–10.5)
nRBC: 0 % (ref 0.0–0.2)

## 2019-11-14 LAB — CLOZAPINE (CLOZARIL)
Clozapine Lvl: 1312 ng/mL — ABNORMAL HIGH (ref 350–650)
NorClozapine: 428 ng/mL
Total(Cloz+Norcloz): 1740 ng/mL

## 2019-11-14 LAB — PROCALCITONIN: Procalcitonin: 1.7 ng/mL

## 2019-11-14 LAB — VITAMIN B1: Vitamin B1 (Thiamine): 488 nmol/L — ABNORMAL HIGH (ref 66.5–200.0)

## 2019-11-14 MED ORDER — SODIUM CHLORIDE 0.9 % IV SOLN
2.0000 g | Freq: Two times a day (BID) | INTRAVENOUS | Status: DC
  Administered 2019-11-14 – 2019-11-15 (×3): 2 g via INTRAVENOUS
  Filled 2019-11-14 (×4): qty 2

## 2019-11-14 MED ORDER — CLONAZEPAM 1 MG PO TABS
1.0000 mg | ORAL_TABLET | Freq: Every day | ORAL | Status: DC
  Administered 2019-11-14 – 2019-11-27 (×14): 1 mg via ORAL
  Filled 2019-11-14 (×14): qty 1

## 2019-11-14 NOTE — Progress Notes (Signed)
Alerted to leaking foley by tech.  Foley appears to be kinked, a moderate amount of urine on sheets and pad.  Patient was cleaned and sheets changed.  Foley flushed with 10 cc NS, 500cc clear yellow urine immediately filled foley bag.  Sediment noted to urometer.

## 2019-11-14 NOTE — Progress Notes (Signed)
PHARMACY NOTE:  ANTIMICROBIAL RENAL DOSAGE ADJUSTMENT  Current antimicrobial regimen includes a mismatch between antimicrobial dosage and estimated renal function.  As per policy approved by the Pharmacy & Therapeutics and Medical Executive Committees, the antimicrobial dosage will be adjusted accordingly.  Current antimicrobial dosage:  Cefepime 2 grams IV q24h  Indication: sepsis  Renal Function:   Estimated Creatinine Clearance: 62.5 mL/min (A) (by C-G formula based on SCr of 1.51 mg/dL (H)). []      On intermittent HD, scheduled: []      On CRRT    Antimicrobial dosage has been changed to:  Cefepime 2 grams IV q12h   Additional Comments:  AKI resolving; h/o CKD noted   Thank you for allowing pharmacy to be a part of this patient's care.  Clayburn Pert, PharmD, BCPS 11/14/2019  12:09 PM

## 2019-11-14 NOTE — Progress Notes (Signed)
PROGRESS NOTE    [name]  VZD:[id number] DOB: [birth year] DOA: 10/25/2019 PCP: [name], FNP    Brief Narrative:   Patient is a 68 year old male, obese, with PMH significant for paranoid schizophrenia, prolonged QT interval, non-Hodgkin's lymphoma, hypertension, hyperlipidemia, COPD, diastolic congestive heart failure and chronic kidney disease. Patient lives in a group home and on12/9 patient was sent to the ED for worsening shortness of breath, fever chills and low O2 sat at 89%.   In the ED, patient was started on O2 by nasal cannula. Work-up showed elevated creatinine to 3.09 (up from 1.67). COVID-19 PCR was positive. Chest x-ray revealed multifocal pneumonia.  Patient was admitted to hospital service for further evaluation and management.  Was initially hospitalized for Covid pneumonia for which he completed the treatment. His hospital course was complicated by AKI, acute urinary retention.  Patient completed a course of Decadron, IV remdesivir.  He continues to be encephalopathic with intermittent agitation and drowsiness, currently awaiting SNF placement.   Assessment & Plan:   Principal Problem:   Schizophrenia ([place]) Active Problems:   Sepsis secondary to UTI ([place])   Pneumonia due to severe acute respiratory syndrome coronavirus 2 (SARS-CoV-2)   Obstructive uropathy   Prostatitis   Acute Hypoxic Respiratory Failure due to Acute Covid 19 Viral Pneumonia during the ongoing 2020 Covid 19 Pandemic - POA Patient presenting with hypoxia on presentation with oxygen saturations 89%.  Covid-19 PCR positive with chest x-ray notable for multifocal pneumonia with increased inflammatory markers.  CT abdomen/pelvis on 11/10/2019 with nodular appearance right lung base with recommendations of surveillance and follow-up noncontrast CT in 6-8 weeks.  Patient completed a 5-day course of remdesivir on 10/29/2019 and a 10-day course of Decadron on 11/03/2019. --Continue  supplemental oxygen, titrate for goal SPO2 greater than 92% --plan to discontinue contact/airborne isolation precautions 11/15/2019; as this will be 21 days from his positive Covid test on 10/25/2019.  Acute kidney injury on CKD stage IV sepsis with shock likely 2/2 UTI versus prostatitis Presenting with a creatinine of 3.09, which is up from his baseline of 1.67.  Renal ultrasound unremarkable with no hydronephrosis or obstruction.  On 11/10/2019, patient with fever and white blood cell count up to 47.2 with a procalcitonin of 22.18.  CT abdomen/pelvis notable for a malpositioned Foley catheter with potential cystitis, a sending urinary tract infection versus prostatitis. --Foley catheter replaced  --WBC 47.2-->37.1-->18.9-->12.5-->11.4 --PCT 22.18-->9.75-->5.53-->3.67-->1.70 --Cr 4.34-->3.49-->2.48-->2.09-->1.51 --Urine culture with no significant growth --Blood cultures x2: No growth x 4 days --Continue IV cefepime, plan 14d course --Continue to closely monitor urine output, renal function, WBC count, and PCT daily  Acute metabolic encephalopathy with intermittent hallucinations/agitation, history of schizoaffective disorder On multiple psych meds clozapine, benztropine, Thorazine, Lamictal, Lexapro at home.  Patient was confused and more somnolent during his hospital course.  Psychiatry was consulted with recommendations to discontinue Cogentin, Thorazine, Lamictal. --clozapine 100 mg qHS --Lexapro 10 mg p.o. daily --Clonazepam 0.5 mg twice daily and 1mg  qHS --Await psychiatry re-consult for assistance with further adjustments in his antipsychotics as he continues with agitation and pulling at medical devices  Hypernatremia Sodium trending up, 148, likely 2/2 poor p.o. intake due to somnolence and agitation. --Na 148-->143-->145-->143 --continue D5W to 125 cc/hour  --monitor sodium levels daily  Acute renal urinary retention Patient had Foley catheter placed on admission for acute  urinary retention.  Attempt at removal on 1216 with recurrent urinary retention.  On 11/10/2019 patient with dislodgment of his catheter which was once again replaced. --  We will attempt voiding trial once patient off of airborne/contact isolation and mental status improves  Drug reaction/fungal infection Currently on lower extremities, left flank, axilla, groin area --continue nystatin topical  Hyperkalemia Resolved  QTC prolongation QTC 478 on EKG 12/20 --Keep potassium> 4, magnesium> 2 --follow BMP daily  Chronic diastolic CHF, compensated 2D echo in 09/2018 showed EF of 65 to 12% with LV diastolic dysfunction  Hyperlipidemia --Continue Lipitor  Hypothyroidism --Continue levothyroxine 100 mcg p.o. daily  Morbid obesity  BMI 33.9, patient advised diet and weight control  pressure injury Stage II, left buttocks, present on admission --Wound care per nursing   DVT prophylaxis: Lovenox Code Status: Full code Family Communication: None present at bedside Disposition Plan: Transfer to [place] pending; pending SNF placement, difficult secondary to his underlying psychiatric illness, social work for coordination   Consultants:   none  Procedures:   none  Antimicrobials:   Cefepime 12/25>>  Remdesivir 12/9 - 12/13  Doxycycline 12/9 - 12/14  Ceftriaxone 12/9 - 12/14    Subjective: Patient seen and examined at bedside, remains confused.  Sitter present at bedside.  States he is thirsty.  No other obvious complaints this morning.  Continues to pull at medical devices.  No other acute concerns overnight per nursing staff.  Objective: Vitals:   11/14/19 0757 11/14/19 0800 11/14/19 0900 11/14/19 1000  BP: (!) 144/78 (!) 135/39    Pulse: 94 96 96 (!) 101  Resp: 18 18 15 19   Temp: 98 F (36.7 C)     TempSrc: Axillary     SpO2: 97% 96% 94% 93%  Weight:      Height:        Intake/Output Summary (Last 24 hours) at 11/14/2019 1108 Last data filed at  11/14/2019 1000 Gross per 24 hour  Intake 2871.98 ml  Output 2625 ml  Net 246.98 ml   Filed Weights   10/26/19 1812 11/07/19 0939 11/08/19 1324  Weight: (!) 183.9 kg 113.4 kg 113 kg    Examination:  General exam: Confused, no acute distress Respiratory system: Clear to auscultation. Respiratory effort normal. Cardiovascular system: S1 & S2 heard, RRR. No JVD, murmurs, rubs, gallops or clicks. No pedal edema. Gastrointestinal system: Abdomen is nondistended, soft and nontender. No organomegaly or masses felt. Normal bowel sounds heard. GU: Foley catheter noted in place Central nervous system: Alert and oriented. No focal neurological deficits. Extremities: Symmetric 5 x 5 power. Skin: No rashes, lesions or ulcers Psychiatry: Confused, poor judgment/insight    Data Reviewed: I have personally reviewed following labs and imaging studies  CBC: Recent Labs  Lab 11/10/19 1730 11/11/19 0303 11/12/19 0235 11/13/19 0239 11/14/19 0218  WBC 43.2* 37.1* 18.9* 12.5* 11.4*  NEUTROABS 36.9*  --   --   --  9.3*  HGB 11.6* 10.6* 9.6* 10.8* 10.6*  HCT 38.2* 35.7* 33.1* 36.5* 33.6*  MCV 105.5* 105.0* 109.6* 106.4* 98.8  PLT 137* 144* 132* 120* 878   Basic Metabolic Panel: Recent Labs  Lab 11/10/19 1730 11/11/19 0900 11/12/19 0235 11/13/19 0239 11/14/19 0421  NA 145 148* 143 145 143  K 4.7 4.4 4.0 5.1 3.9  CL 109 112* 113* 111 108  CO2 24 27 24 25 26   GLUCOSE 190* 162* 142* 138* 161*  BUN 67* 62* 53* 40* 22  CREATININE 4.34* 3.49* 2.48* 2.09* 1.51*  CALCIUM 9.1 8.8* 8.5* 9.0 9.5   GFR: Estimated Creatinine Clearance: 62.5 mL/min (A) (by C-G formula based on SCr of 1.51 mg/dL (H)). Liver Function  Tests: Recent Labs  Lab 11/10/19 1235 11/10/19 1730 11/11/19 0900 11/12/19 0235 11/13/19 0239  AST 96* 71* 50* 39 40  ALT 33 28 29 29  33  ALKPHOS 74 73 121 76 88  BILITOT 1.0 0.6 0.6 0.3 1.2  PROT 6.0* 5.9* 6.0* 5.5* 5.6*  ALBUMIN 3.1* 2.9* 2.7* 2.4* 2.5*   No results  for input(s): LIPASE, AMYLASE in the last 168 hours. Recent Labs  Lab 11/10/19 1236 11/11/19 0900  AMMONIA 25 25   Coagulation Profile: No results for input(s): INR, PROTIME in the last 168 hours. Cardiac Enzymes: No results for input(s): CKTOTAL, CKMB, CKMBINDEX, TROPONINI in the last 168 hours. BNP (last 3 results) No results for input(s): PROBNP in the last 8760 hours. HbA1C: No results for input(s): HGBA1C in the last 72 hours. CBG: Recent Labs  Lab 11/13/19 1714  GLUCAP 121*   Lipid Profile: No results for input(s): CHOL, HDL, LDLCALC, TRIG, CHOLHDL, LDLDIRECT in the last 72 hours. Thyroid Function Tests: No results for input(s): TSH, T4TOTAL, FREET4, T3FREE, THYROIDAB in the last 72 hours. Anemia Panel: No results for input(s): VITAMINB12, FOLATE, FERRITIN, TIBC, IRON, RETICCTPCT in the last 72 hours. Sepsis Labs: Recent Labs  Lab 11/10/19 1730 11/11/19 0900 11/12/19 0235 11/13/19 0239 11/14/19 0421  PROCALCITON 22.18 9.75 5.53 3.67 1.70  LATICACIDVEN 1.4  --   --   --   --     Recent Results (from the past 240 hour(s))  Culture, blood (routine x 2)     Status: None (Preliminary result)   Collection Time: 11/10/19  5:30 PM   Specimen: BLOOD RIGHT HAND  Result Value Ref Range Status   Specimen Description BLOOD RIGHT HAND  Final   Special Requests   Final    BOTTLES DRAWN AEROBIC ONLY Blood Culture adequate volume   Culture   Final    NO GROWTH 3 DAYS Performed at [facility], [address] [place], [place] [address]    Report Status PENDING  Incomplete  Culture, blood (routine x 2)     Status: None (Preliminary result)   Collection Time: 11/10/19  5:30 PM   Specimen: BLOOD RIGHT ARM  Result Value Ref Range Status   Specimen Description   Final    BLOOD RIGHT ARM Performed at [facility], [place] [address] [place], [place] [address]    Special Requests   Final    BOTTLES DRAWN AEROBIC AND ANAEROBIC Blood Culture adequate volume  Performed at [facility] [address], [place], [place] [address]    Culture   Final    NO GROWTH 3 DAYS Performed at [facility], [place] [address], [place], [place] [address]    Report Status PENDING  Incomplete  MRSA PCR Screening     Status: None   Collection Time: 11/10/19  9:09 PM   Specimen: Nasal Mucosa; Nasopharyngeal  Result Value Ref Range Status   MRSA by PCR NEGATIVE NEGATIVE Final    Comment:        The GeneXpert MRSA Assay (FDA approved for NASAL specimens only), is one component of a comprehensive MRSA colonization surveillance program. It is not intended to diagnose MRSA infection nor to guide or monitor treatment for MRSA infections. Performed at [facility], [place] [address] [place], [place] [address]   Urine culture     Status: Abnormal   Collection Time: 11/11/19  2:44 AM   Specimen: Urine, Random  Result Value Ref Range  Status   Specimen Description   Final    URINE, RANDOM Performed at [facility], [address] [place], [place] [address]    Special Requests   Final    NONE Performed at [facility], [place] [address] [place], [place] [address]    Culture (A)  Final    <10,000 COLONIES/mL INSIGNIFICANT GROWTH Performed at [facility] [address], [place], [place] [address]    Report Status 11/12/2019 FINAL  Final         Radiology Studies: No results found.      Scheduled Meds: . aspirin EC  81 mg Oral Daily  . atorvastatin  20 mg Oral q1800  . Chlorhexidine Gluconate Cloth  6 each Topical Daily  . cholecalciferol  5,000 Units Oral Daily  . clonazepam  1 mg Oral QHS  . clotrimazole   Topical BID  . cloZAPine  100 mg Oral QHS  . docusate sodium  100 mg Oral Daily  . enoxaparin (LOVENOX) injection  40 mg Subcutaneous QHS  . escitalopram  10 mg Oral QAC breakfast  . feeding supplement (ENSURE ENLIVE)  237 mL Oral BID BM  . fluticasone  1 puff  Inhalation BID  . folic acid  1 mg Oral Daily  . Ipratropium-Albuterol  1 puff Inhalation TID  . levothyroxine  100 mcg Oral Q0600  . mouth rinse  15 mL Mouth Rinse BID  . multivitamin with minerals  1 tablet Oral Daily  . nystatin  1 Bottle Topical Daily  . polyethylene glycol  17 g Oral Daily  . senna  1 tablet Oral Daily  . thiamine  100 mg Oral Daily  . vitamin C  500 mg Oral Daily  . zinc sulfate  220 mg Oral Daily   Continuous Infusions: . ceFEPime (MAXIPIME) IV Stopped (11/14/19 1028)  . dextrose Stopped (11/14/19 0958)  . sodium chloride       LOS: 20 days    Time spent: 36 minutes spent on chart review, discussion with nursing staff, consultants, updating family and interview/physical exam; more than 50% of that time was spent in counseling and/or coordination of care.     J [place] DO [facility] 11/14/2019, 11:08 AM

## 2019-11-14 NOTE — Progress Notes (Signed)
PT Cancellation Note  Patient Details Name: [name] MRN: [id number] DOB: 06/28/1951   Cancelled Treatment:     Pt unable to tolerate due to medical decline and inability to follow directions.  Will continue to follow as pt has been evaluated with rec for SNF.   Rica Koyanagi  PTA Acute  Rehabilitation Services Pager      [phone] Office      [phone]

## 2019-11-15 LAB — BASIC METABOLIC PANEL
Anion gap: 8 (ref 5–15)
BUN: 19 mg/dL (ref 8–23)
CO2: 27 mmol/L (ref 22–32)
Calcium: 9.6 mg/dL (ref 8.9–10.3)
Chloride: 107 mmol/L (ref 98–111)
Creatinine, Ser: 1.47 mg/dL — ABNORMAL HIGH (ref 0.61–1.24)
GFR calc Af Amer: 57 mL/min — ABNORMAL LOW (ref >60)
GFR calc non Af Amer: 49 mL/min — ABNORMAL LOW (ref >60)
Glucose, Bld: 143 mg/dL — ABNORMAL HIGH (ref 70–99)
Potassium: 3.8 mmol/L (ref 3.5–5.1)
Sodium: 142 mmol/L (ref 135–145)

## 2019-11-15 LAB — CBC
HCT: 32.2 % — ABNORMAL LOW (ref 39.0–52.0)
Hemoglobin: 10.2 g/dL — ABNORMAL LOW (ref 13.0–17.0)
MCH: 30.9 pg (ref 26.0–34.0)
MCHC: 31.7 g/dL (ref 30.0–36.0)
MCV: 97.6 fL (ref 80.0–100.0)
Platelets: 123 10*3/uL — ABNORMAL LOW (ref 150–400)
RBC: 3.3 MIL/uL — ABNORMAL LOW (ref 4.22–5.81)
RDW: 13.9 % (ref 11.5–15.5)
WBC: 9.7 10*3/uL (ref 4.0–10.5)
nRBC: 0 % (ref 0.0–0.2)

## 2019-11-15 LAB — COPPER, SERUM: Copper: 120 ug/dL (ref 72–166)

## 2019-11-15 LAB — CULTURE, BLOOD (ROUTINE X 2)
Culture: NO GROWTH
Culture: NO GROWTH
Special Requests: ADEQUATE
Special Requests: ADEQUATE

## 2019-11-15 MED ORDER — LORAZEPAM 2 MG/ML IJ SOLN
0.5000 mg | Freq: Two times a day (BID) | INTRAMUSCULAR | Status: DC | PRN
  Administered 2019-11-16 – 2019-11-28 (×11): 0.5 mg via INTRAVENOUS
  Filled 2019-11-15 (×12): qty 1

## 2019-11-15 NOTE — Progress Notes (Signed)
PROGRESS NOTE    [name]  WEX:[id number] DOB: 20-Dec-1950 DOA: 10/25/2019 PCP: [name], FNP    Brief Narrative:   Patient is a 68 year old male, obese, with PMH significant for paranoid schizophrenia, prolonged QT interval, non-Hodgkin's lymphoma, hypertension, hyperlipidemia, COPD, diastolic congestive heart failure and chronic kidney disease. Patient lives in a group home and on12/9 patient was sent to the ED for worsening shortness of breath, fever chills and low O2 sat at 89%.   In the ED, patient was started on O2 by nasal cannula. Work-up showed elevated creatinine to 3.09 (up from 1.67). COVID-19 PCR was positive. Chest x-ray revealed multifocal pneumonia.  Patient was admitted to hospital service for further evaluation and management.  Was initially hospitalized for Covid pneumonia for which he completed the treatment. His hospital course was complicated by AKI, acute urinary retention.  Patient completed a course of Decadron, IV remdesivir.  He continues to be encephalopathic with intermittent agitation and drowsiness, currently awaiting SNF placement.   Assessment & Plan:   Principal Problem:   Schizophrenia ([place]) Active Problems:   Sepsis secondary to UTI ([place])   Pneumonia due to severe acute respiratory syndrome coronavirus 2 (SARS-CoV-2)   Obstructive uropathy   Prostatitis   Acute Hypoxic Respiratory Failure due to Acute Covid 19 Viral Pneumonia during the ongoing 2020 Covid 19 Pandemic - POA Patient presenting with hypoxia on presentation with oxygen saturations 89%.  Covid-19 PCR positive with chest x-ray notable for multifocal pneumonia with increased inflammatory markers.  CT abdomen/pelvis on 11/10/2019 with nodular appearance right lung base with recommendations of surveillance and follow-up noncontrast CT in 6-8 weeks.  Patient completed a 5-day course of remdesivir on 10/29/2019 and a 10-day course of Decadron on 11/03/2019. --Continue  supplemental oxygen, titrate for goal SPO2 greater than 92% --plan to discontinue contact/airborne isolation precautions 11/15/2019; as this will be 21 days from his positive Covid test on 10/25/2019.  Acute kidney injury on CKD stage IV sepsis with shock likely 2/2 UTI versus prostatitis Presenting with a creatinine of 3.09, which is up from his baseline of 1.67.  Renal ultrasound unremarkable with no hydronephrosis or obstruction.  On 11/10/2019, patient with fever and white blood cell count up to 47.2 with a procalcitonin of 22.18.  CT abdomen/pelvis notable for a malpositioned Foley catheter with potential cystitis, a sending urinary tract infection versus prostatitis. --Foley catheter replaced; will attempt voiding trial today --WBC 47.2-->37.1-->18.9-->12.5-->11.4-->9.7 --PCT 22.18-->9.75-->5.53-->3.67-->1.70 --Cr 4.34-->3.49-->2.48-->2.09-->1.51-->1.47 --Urine culture with no significant growth --Blood cultures x2: No growth x 5 days --Continue IV cefepime, plan 14d course --Continue to closely monitor urine output, renal function, WBC count  Acute metabolic encephalopathy with intermittent hallucinations/agitation, history of schizoaffective disorder On multiple psych meds clozapine, benztropine, Thorazine, Lamictal, Lexapro at home.  Patient was confused and more somnolent during his hospital course.  Psychiatry was consulted with recommendations to discontinue Cogentin, Thorazine, Lamictal. --clozapine 100 mg qHS --Lexapro 10 mg p.o. daily --Clonazepam 0.5 mg twice daily and 1mg  qHS --Await psychiatry re-consult for assistance with further adjustments in his antipsychotics as he continues with agitation and pulling at medical devices  Hypernatremia Sodium trending up, 148, likely 2/2 poor p.o. intake due to somnolence and agitation. --Na 148-->143-->145-->143-->142 --continue D5W to 125 cc/hour  --monitor sodium levels daily  Acute renal urinary retention Patient had Foley  catheter placed on admission for acute urinary retention.  Attempt at removal on 1216 with recurrent urinary retention.  On 11/10/2019 patient with dislodgment of his catheter which was once again  replaced. --Discontinue Foley with voiding trial today  Drug reaction/fungal infection Currently on lower extremities, left flank, axilla, groin area --continue nystatin topical  Hyperkalemia Resolved  QTC prolongation QTC 478 on EKG 12/20 --Keep potassium> 4, magnesium> 2 --follow BMP daily  Chronic diastolic CHF, compensated 2D echo in 09/2018 showed EF of 65 to 16% with LV diastolic dysfunction  Hyperlipidemia --Continue Lipitor  Hypothyroidism --Continue levothyroxine 100 mcg p.o. daily  Morbid obesity  BMI 33.9, patient advised diet and weight control  pressure injury Stage II, left buttocks, present on admission --Wound care per nursing   DVT prophylaxis: Lovenox Code Status: Full code Family Communication: Updated patient's caregiver/guardian [name] via telephone today Disposition Plan: pending SNF placement, difficult secondary to his underlying psychiatric illness, social work for coordination   Consultants:   none  Procedures:   none  Antimicrobials:   Cefepime 12/25>>  Remdesivir 12/9 - 12/13  Doxycycline 12/9 - 12/14  Ceftriaxone 12/9 - 12/14    Subjective: Patient seen and examined at bedside, remains confused.  Sitter present at bedside.  Continues to require doses of Ativan overnight for agitation.  No other obvious complaints this morning.  Continues to pull at medical devices.  No other acute concerns overnight per nursing staff.  Will attempt voiding trial today with removal of Foley catheter.  Updated patient's caregiver/guardian and via telephone this afternoon.  Objective: Vitals:   11/15/19 0145 11/15/19 0518 11/15/19 0700 11/15/19 1257  BP:  110/61  112/65  Pulse:  91  (!) 106  Resp:  20  20  Temp: 97.7 F (36.5 C) 98.7 F (37.1  C)  97.7 F (36.5 C)  TempSrc: Oral Axillary  Axillary  SpO2:  93%  97%  Weight:   113.3 kg   Height:        Intake/Output Summary (Last 24 hours) at 11/15/2019 1411 Last data filed at 11/15/2019 0859 Gross per 24 hour  Intake 1091.72 ml  Output 2575 ml  Net -1483.28 ml   Filed Weights   11/07/19 0939 11/08/19 1324 11/15/19 0700  Weight: 113.4 kg 113 kg 113.3 kg    Examination:  General exam: Confused, no acute distress Respiratory system: Clear to auscultation. Respiratory effort normal.  Oxygenating well on room air. Cardiovascular system: S1 & S2 heard, RRR. No JVD, murmurs, rubs, gallops or clicks. No pedal edema. Gastrointestinal system: Abdomen is nondistended, soft and nontender. No organomegaly or masses felt. Normal bowel sounds heard. GU: Foley catheter noted in place Central nervous system: Somnolent, but will open eyes to command but incoherent verbal conversation. No focal neurological deficits. Extremities: Symmetric 5 x 5 power. Skin: No rashes, lesions or ulcers Psychiatry: Confused, poor judgment/insight    Data Reviewed: I have personally reviewed following labs and imaging studies  CBC: Recent Labs  Lab 11/10/19 1730 11/11/19 0303 11/12/19 0235 11/13/19 0239 11/14/19 0218 11/15/19 0337  WBC 43.2* 37.1* 18.9* 12.5* 11.4* 9.7  NEUTROABS 36.9*  --   --   --  9.3*  --   HGB 11.6* 10.6* 9.6* 10.8* 10.6* 10.2*  HCT 38.2* 35.7* 33.1* 36.5* 33.6* 32.2*  MCV 105.5* 105.0* 109.6* 106.4* 98.8 97.6  PLT 137* 144* 132* 120* 156 109*   Basic Metabolic Panel: Recent Labs  Lab 11/11/19 0900 11/12/19 0235 11/13/19 0239 11/14/19 0421 11/15/19 0337  NA 148* 143 145 143 142  K 4.4 4.0 5.1 3.9 3.8  CL 112* 113* 111 108 107  CO2 27 24 25 26 27   GLUCOSE 162* 142* 138*  161* 143*  BUN 62* 53* 40* 22 19  CREATININE 3.49* 2.48* 2.09* 1.51* 1.47*  CALCIUM 8.8* 8.5* 9.0 9.5 9.6   GFR: Estimated Creatinine Clearance: 64.3 mL/min (A) (by C-G formula based on  SCr of 1.47 mg/dL (H)). Liver Function Tests: Recent Labs  Lab 11/10/19 1235 11/10/19 1730 11/11/19 0900 11/12/19 0235 11/13/19 0239  AST 96* 71* 50* 39 40  ALT 33 28 29 29  33  ALKPHOS 74 73 121 76 88  BILITOT 1.0 0.6 0.6 0.3 1.2  PROT 6.0* 5.9* 6.0* 5.5* 5.6*  ALBUMIN 3.1* 2.9* 2.7* 2.4* 2.5*   No results for input(s): LIPASE, AMYLASE in the last 168 hours. Recent Labs  Lab 11/10/19 1236 11/11/19 0900  AMMONIA 25 25   Coagulation Profile: No results for input(s): INR, PROTIME in the last 168 hours. Cardiac Enzymes: No results for input(s): CKTOTAL, CKMB, CKMBINDEX, TROPONINI in the last 168 hours. BNP (last 3 results) No results for input(s): PROBNP in the last 8760 hours. HbA1C: No results for input(s): HGBA1C in the last 72 hours. CBG: Recent Labs  Lab 11/13/19 1714  GLUCAP 121*   Lipid Profile: No results for input(s): CHOL, HDL, LDLCALC, TRIG, CHOLHDL, LDLDIRECT in the last 72 hours. Thyroid Function Tests: No results for input(s): TSH, T4TOTAL, FREET4, T3FREE, THYROIDAB in the last 72 hours. Anemia Panel: No results for input(s): VITAMINB12, FOLATE, FERRITIN, TIBC, IRON, RETICCTPCT in the last 72 hours. Sepsis Labs: Recent Labs  Lab 11/10/19 1730 11/11/19 0900 11/12/19 0235 11/13/19 0239 11/14/19 0421  PROCALCITON 22.18 9.75 5.53 3.67 1.70  LATICACIDVEN 1.4  --   --   --   --     Recent Results (from the past 240 hour(s))  Culture, blood (routine x 2)     Status: None   Collection Time: 11/10/19  5:30 PM   Specimen: BLOOD RIGHT HAND  Result Value Ref Range Status   Specimen Description BLOOD RIGHT HAND  Final   Special Requests   Final    BOTTLES DRAWN AEROBIC ONLY Blood Culture adequate volume   Culture   Final    NO GROWTH 5 DAYS Performed at [facility], [address] [place], [place] [address]    Report Status 11/15/2019 FINAL  Final  Culture, blood (routine x 2)     Status: None   Collection Time: 11/10/19  5:30 PM   Specimen:  BLOOD RIGHT ARM  Result Value Ref Range Status   Specimen Description   Final    BLOOD RIGHT ARM Performed at [facility], [place] [address], [place], [place] [address]    Special Requests   Final    BOTTLES DRAWN AEROBIC AND ANAEROBIC Blood Culture adequate volume Performed at [place] [address] [place], [place] [address]    Culture   Final    NO GROWTH 5 DAYS Performed at [facility], [place] [address], [place], [place] [address]    Report Status 11/15/2019 FINAL  Final  MRSA PCR Screening     Status: None   Collection Time: 11/10/19  9:09 PM   Specimen: Nasal Mucosa; Nasopharyngeal  Result Value Ref Range Status   MRSA by PCR NEGATIVE NEGATIVE Final    Comment:        The GeneXpert MRSA Assay (FDA approved for NASAL specimens only), is one component of a comprehensive MRSA colonization surveillance program. It is not intended to diagnose MRSA infection nor to guide or monitor treatment for MRSA  infections. Performed at [facility], [place] [address], [place], [place] [address]   Urine culture     Status: Abnormal   Collection Time: 11/11/19  2:44 AM   Specimen: Urine, Random  Result Value Ref Range Status   Specimen Description   Final    URINE, RANDOM Performed at [facility], [address] [place], [place] [address]    Special Requests   Final    NONE Performed at [facility], [place] [address] [place], [place] [address]    Culture (A)  Final    <10,000 COLONIES/mL INSIGNIFICANT GROWTH Performed at [place] [address], [place], [place] [address]    Report Status 11/12/2019 FINAL  Final         Radiology Studies: No results found.      Scheduled Meds: . aspirin EC  81 mg Oral Daily  . atorvastatin  20 mg Oral q1800  . Chlorhexidine Gluconate Cloth  6 each Topical Daily  . cholecalciferol  5,000 Units Oral Daily  . clonazePAM  1 mg Oral QHS  .  clotrimazole   Topical BID  . cloZAPine  100 mg Oral QHS  . docusate sodium  100 mg Oral Daily  . enoxaparin (LOVENOX) injection  40 mg Subcutaneous QHS  . escitalopram  10 mg Oral QAC breakfast  . feeding supplement (ENSURE ENLIVE)  237 mL Oral BID BM  . fluticasone  1 puff Inhalation BID  . folic acid  1 mg Oral Daily  . Ipratropium-Albuterol  1 puff Inhalation TID  . levothyroxine  100 mcg Oral Q0600  . mouth rinse  15 mL Mouth Rinse BID  . multivitamin with minerals  1 tablet Oral Daily  . nystatin  1 Bottle Topical Daily  . polyethylene glycol  17 g Oral Daily  . senna  1 tablet Oral Daily  . thiamine  100 mg Oral Daily  . vitamin C  500 mg Oral Daily  . zinc sulfate  220 mg Oral Daily   Continuous Infusions: . ceFEPime (MAXIPIME) IV 2 g (11/15/19 0955)  . dextrose 125 mL/hr at 11/15/19 0621     LOS: 21 days    Time spent: 36 minutes spent on chart review, discussion with nursing staff, consultants, updating family and interview/physical exam; more than 50% of that time was spent in counseling and/or coordination of care.    Anish Vana J [place] DO [facility] 11/15/2019, 2:11 PM

## 2019-11-15 NOTE — Progress Notes (Signed)
OT Cancellation Note  Patient Details Name: [name] MRN: [id number] DOB: [birth year]   Cancelled Treatment:    Reason Eval/Treat Not Completed: Other (comment).  Spoke to [name].  Pt was sleeping soundly and did not rouse during their care. Will check back tomorrow as schedule permits  Sheena Donegan 11/15/2019, 1:28 PM  Karsten Ro, OTR/L Acute Rehabilitation Services 11/15/2019

## 2019-11-16 DIAGNOSIS — J9601 Acute respiratory failure with hypoxia: Secondary | ICD-10-CM

## 2019-11-16 DIAGNOSIS — R339 Retention of urine, unspecified: Secondary | ICD-10-CM

## 2019-11-16 DIAGNOSIS — L89306 Pressure-induced deep tissue damage of unspecified buttock: Secondary | ICD-10-CM

## 2019-11-16 DIAGNOSIS — R9431 Abnormal electrocardiogram [ECG] [EKG]: Secondary | ICD-10-CM

## 2019-11-16 DIAGNOSIS — E870 Hyperosmolality and hypernatremia: Secondary | ICD-10-CM

## 2019-11-16 DIAGNOSIS — I129 Hypertensive chronic kidney disease with stage 1 through stage 4 chronic kidney disease, or unspecified chronic kidney disease: Secondary | ICD-10-CM

## 2019-11-16 DIAGNOSIS — E875 Hyperkalemia: Secondary | ICD-10-CM

## 2019-11-16 DIAGNOSIS — G9341 Metabolic encephalopathy: Secondary | ICD-10-CM

## 2019-11-16 DIAGNOSIS — E785 Hyperlipidemia, unspecified: Secondary | ICD-10-CM

## 2019-11-16 DIAGNOSIS — E039 Hypothyroidism, unspecified: Secondary | ICD-10-CM

## 2019-11-16 DIAGNOSIS — I5042 Chronic combined systolic (congestive) and diastolic (congestive) heart failure: Secondary | ICD-10-CM

## 2019-11-16 DIAGNOSIS — R6521 Severe sepsis with septic shock: Secondary | ICD-10-CM

## 2019-11-16 DIAGNOSIS — Z6833 Body mass index (BMI) 33.0-33.9, adult: Secondary | ICD-10-CM

## 2019-11-16 DIAGNOSIS — I130 Hypertensive heart and chronic kidney disease with heart failure and stage 1 through stage 4 chronic kidney disease, or unspecified chronic kidney disease: Secondary | ICD-10-CM

## 2019-11-16 DIAGNOSIS — B369 Superficial mycosis, unspecified: Secondary | ICD-10-CM

## 2019-11-16 LAB — BASIC METABOLIC PANEL
Anion gap: 10 (ref 5–15)
BUN: 15 mg/dL (ref 8–23)
CO2: 26 mmol/L (ref 22–32)
Calcium: 9.8 mg/dL (ref 8.9–10.3)
Chloride: 105 mmol/L (ref 98–111)
Creatinine, Ser: 1.52 mg/dL — ABNORMAL HIGH (ref 0.61–1.24)
GFR calc Af Amer: 55 mL/min — ABNORMAL LOW (ref >60)
GFR calc non Af Amer: 47 mL/min — ABNORMAL LOW (ref >60)
Glucose, Bld: 134 mg/dL — ABNORMAL HIGH (ref 70–99)
Potassium: 3.9 mmol/L (ref 3.5–5.1)
Sodium: 141 mmol/L (ref 135–145)

## 2019-11-16 LAB — CBC
HCT: 33.3 % — ABNORMAL LOW (ref 39.0–52.0)
Hemoglobin: 10.5 g/dL — ABNORMAL LOW (ref 13.0–17.0)
MCH: 30.5 pg (ref 26.0–34.0)
MCHC: 31.5 g/dL (ref 30.0–36.0)
MCV: 96.8 fL (ref 80.0–100.0)
Platelets: 130 10*3/uL — ABNORMAL LOW (ref 150–400)
RBC: 3.44 MIL/uL — ABNORMAL LOW (ref 4.22–5.81)
RDW: 14.1 % (ref 11.5–15.5)
WBC: 8.1 10*3/uL (ref 4.0–10.5)
nRBC: 0 % (ref 0.0–0.2)

## 2019-11-16 MED ORDER — IPRATROPIUM-ALBUTEROL 20-100 MCG/ACT IN AERS
1.0000 | INHALATION_SPRAY | Freq: Two times a day (BID) | RESPIRATORY_TRACT | Status: DC
  Administered 2019-11-16 – 2019-11-23 (×14): 1 via RESPIRATORY_TRACT
  Filled 2019-11-16: qty 4

## 2019-11-16 MED ORDER — CLOZAPINE 25 MG PO TABS
150.0000 mg | ORAL_TABLET | Freq: Two times a day (BID) | ORAL | Status: DC
  Administered 2019-11-16 – 2019-11-28 (×24): 150 mg via ORAL
  Filled 2019-11-16 (×26): qty 2

## 2019-11-16 MED ORDER — BENZTROPINE MESYLATE 0.5 MG PO TABS
0.5000 mg | ORAL_TABLET | Freq: Two times a day (BID) | ORAL | Status: DC
  Administered 2019-11-16 – 2019-11-28 (×24): 0.5 mg via ORAL
  Filled 2019-11-16 (×25): qty 1

## 2019-11-16 MED ORDER — LORAZEPAM 2 MG/ML IJ SOLN
0.5000 mg | Freq: Once | INTRAMUSCULAR | Status: AC
  Administered 2019-11-16: 0.5 mg via INTRAVENOUS
  Filled 2019-11-16: qty 1

## 2019-11-16 MED ORDER — CEFDINIR 300 MG PO CAPS
300.0000 mg | ORAL_CAPSULE | Freq: Two times a day (BID) | ORAL | Status: AC
  Administered 2019-11-16 – 2019-11-23 (×16): 300 mg via ORAL
  Filled 2019-11-16 (×17): qty 1

## 2019-11-16 NOTE — Progress Notes (Signed)
Nutrition Follow-up  RD working remotely.   DOCUMENTATION CODES:   Not applicable  INTERVENTION:  - continue Ensure Enlive BID and Magic Cup BID. - continue to encourage PO intakes.    NUTRITION DIAGNOSIS:   Increased nutrient needs related to acute illness as evidenced by estimated needs. -ongoing  GOAL:   Patient will meet greater than or equal to 90% of their needs -likely unmet on average   MONITOR:   PO intake, Supplement acceptance, Labs, Weight trends  ASSESSMENT:   68 year old male with medical history of obesity, paranoid schizophrenia, prolonged QT interval, non-Hodgkin's lymphoma, HTN, hyperlipidemia, COPD, CHF, and CKD. He lives in a group home. He was sent to the ED on 12/9 for worsening SOB, fever, chills, and low O2 sat (89%). COVID-19 PCR was positive and CXR showed multifocal PNA. He has completed treatment for COVID; hospital course complicated by AKI, acute urinary retention, and drug reaction vs fungal infection to groin, L axilla, and L flank.  Per flow sheet documentation, patient continues to have confusion and is mainly a/o to self only. He has been accepting Ensure ~90% of the time offered and has been eating 25-50% of meals. Weight has been stable since 12/22.   Per notes: - acute hypoxic respiratory failure d/t COVID-19 PNA - AKI on stage 4 CKD - acute metabolic encephalopathy with intermittent hallucinations/agitation - hx of schizoaffective disorder--plan for Psych re-eval  - acute urinary retention--voiding trial 12/30 with plan for Foley removal    Labs reviewed; creatinine: 1.52 mg/dl, GFR: 47 ml/min. Medications reviewed; 5000 units cholecalciferol/day, daily multivitamin with minerals, 1 packet miralax/day, 1 tablet senokot/day, 100 mg oral thiamine/day, 500 mg ascorbic acid/day, 220 mg zinc sulfate/day.  IVF; D5 @ 125 ml/hr (510 kcal).     NUTRITION - FOCUSED PHYSICAL EXAM:  unable to complete for COVID+ patient  Diet Order:   Diet  Order            Diet Heart Room service appropriate? Yes; Fluid consistency: Thin  Diet effective now              EDUCATION NEEDS:   No education needs have been identified at this time  Skin:  Skin Assessment: Reviewed RN Assessment  Last BM:  12/30  Height:   Ht Readings from Last 1 Encounters:  10/26/19 6' (1.829 m)    Weight:   Wt Readings from Last 1 Encounters:  11/15/19 113.3 kg    Ideal Body Weight:  80.9 kg  BMI:  Body mass index is 33.88 kg/m.  Estimated Nutritional Needs:   Kcal:  6761-9509 kcal  Protein:  110-120 grams  Fluid:  >/= 2.2 L/day      Jarome Matin, MS, RD, LDN, [facility] Inpatient Clinical Dietitian Pager # [phone] After hours/weekend pager # [phone]

## 2019-11-16 NOTE — Progress Notes (Signed)
PROGRESS NOTE    [name]  XHB:[id number] DOB: [birth year] DOA: 10/25/2019 PCP: [name], FNP    Brief Narrative:   Patient is a 68 year old male, obese, with PMH significant for paranoid schizophrenia, prolonged QT interval, non-Hodgkin's lymphoma, hypertension, hyperlipidemia, COPD, diastolic congestive heart failure and chronic kidney disease. Patient lives in a group home and on12/9 patient was sent to the ED for worsening shortness of breath, fever chills and low O2 sat at 89%.   In the ED, patient was started on O2 by nasal cannula. Work-up showed elevated creatinine to 3.09 (up from 1.67). COVID-19 PCR was positive. Chest x-ray revealed multifocal pneumonia.  Patient was admitted to hospital service for further evaluation and management.  Was initially hospitalized for Covid pneumonia for which he completed the treatment. His hospital course was complicated by AKI, acute urinary retention.  Patient completed a course of Decadron, IV remdesivir.  He continues to be encephalopathic with intermittent agitation and drowsiness, currently awaiting SNF placement.   Assessment & Plan:   Principal Problem:   Schizophrenia ([place]) Active Problems:   Sepsis secondary to UTI ([place])   Pneumonia due to severe acute respiratory syndrome coronavirus 2 (SARS-CoV-2)   Obstructive uropathy   Prostatitis   Acute Hypoxic Respiratory Failure due to Acute Covid 19 Viral Pneumonia during the ongoing 2020 Covid 19 Pandemic - POA Patient presenting with hypoxia on presentation with oxygen saturations 89%.  Covid-19 PCR positive with chest x-ray notable for multifocal pneumonia with increased inflammatory markers.  CT abdomen/pelvis on 11/10/2019 with nodular appearance right lung base with recommendations of surveillance and follow-up noncontrast CT in 6-8 weeks.  Patient completed a 5-day course of remdesivir on 10/29/2019 and a 10-day course of Decadron on 11/03/2019. --Continue  supplemental oxygen, titrate for goal SPO2 greater than 92% --plan to discontinue contact/airborne isolation precautions 11/15/2019; as this will be 21 days from his positive Covid test on 10/25/2019.  Acute kidney injury on CKD stage IV sepsis with shock likely 2/2 UTI versus prostatitis Presenting with a creatinine of 3.09, which is up from his baseline of 1.67.  Renal ultrasound unremarkable with no hydronephrosis or obstruction.  On 11/10/2019, patient with fever and white blood cell count up to 47.2 with a procalcitonin of 22.18.  CT abdomen/pelvis notable for a malpositioned Foley catheter with potential cystitis, a sending urinary tract infection versus prostatitis. --Foley catheter removed on 11/15/2019 --WBC 47.2-->37.1-->18.9-->12.5-->11.4-->9.7-->8.1 --PCT 22.18-->9.75-->5.53-->3.67-->1.70 --Cr 4.34-->3.49-->2.48-->2.09-->1.51-->1.47--1.52 --Urine culture with no significant growth --Blood cultures x2: No growth x 5 days --Transition cefepime to cefdinir 300mg  BID, plan 14d total course --Continue to closely monitor urine output, renal function, WBC count  Acute metabolic encephalopathy with intermittent hallucinations/agitation, history of schizoaffective disorder On multiple psych meds clozapine, benztropine, Thorazine, Lamictal, Lexapro at home.  Patient was confused and more somnolent during his hospital course.  Psychiatry was consulted with recommendations to discontinue Cogentin, Thorazine, Lamictal. --Seen by psychiatry in reconsultation today, 11/16/2019 --increase clozapine to 150mg  BID --start cogentin 0.5mg  BID --Lexapro 10 mg p.o. daily --Clonazepam 0.5 mg twice daily and 1mg  qHS  Hypernatremia Sodium trending up, 148, likely 2/2 poor p.o. intake due to somnolence and agitation. --Na 148-->143-->145-->143-->142-->141 --continue D5W to 125 cc/hour  --monitor sodium levels daily  Acute renal urinary retention Patient had Foley catheter placed on admission for  acute urinary retention.  Attempt at removal on 1216 with recurrent urinary retention.  On 11/10/2019 patient with dislodgment of his catheter which was once again replaced. --Foley catheter discontinued on 11/15/2019 --Continue monitor  renal function daily --Bladder scan this afternoon to ensure no significant urinary retention  Drug reaction/fungal infection Currently on lower extremities, left flank, axilla, groin area --continue nystatin topical  Hyperkalemia Resolved  QTC prolongation QTC 478 on EKG 12/20 --Keep potassium> 4, magnesium> 2 --follow BMP daily  Chronic diastolic CHF, compensated 2D echo in 09/2018 showed EF of 65 to 46% with LV diastolic dysfunction  Hyperlipidemia --Continue Lipitor  Hypothyroidism --Continue levothyroxine 100 mcg p.o. daily  Morbid obesity  BMI 33.9, patient advised diet and weight control  pressure injury Stage II, left buttocks, present on admission --Wound care per nursing   DVT prophylaxis: Lovenox Code Status: Full code Family Communication: Updated patient's caregiver/guardian [name] via telephone today Disposition Plan: pending SNF placement, difficult secondary to his underlying psychiatric illness, social work for coordination; Guardian suggests looking into [facility] facility located in [place].   Consultants:   none  Procedures:   none  Antimicrobials:   Cefdinir 12/31>>  Cefepime 12/25 - 12/31  Remdesivir 12/9 - 12/13  Doxycycline 12/9 - 12/14  Ceftriaxone 12/9 - 12/14    Subjective: Patient seen and examined at bedside, remains confused.  Sitter present at bedside.  Continues to require doses of Ativan overnight for agitation.  No other obvious complaints this morning.  Continues to pull at medical devices.  Seen by psychiatry again this morning with recommendations of adjustments of his antipsychotics.  No other acute concerns overnight per nursing staff.  Will attempt  voiding trial today with removal of Foley catheter.  Updated patient's caregiver/guardian Webb Silversmith via telephone this afternoon.  Objective: Vitals:   11/15/19 2019 11/16/19 0417 11/16/19 1100 11/16/19 1341  BP: 133/72 130/61  (!) 133/98  Pulse: 96 94 92 93  Resp: 20 18  18   Temp: 98 F (36.7 C) 98.2 F (36.8 C)  98.4 F (36.9 C)  TempSrc:  Oral  Oral  SpO2: 96% 97%  97%  Weight:   111.9 kg   Height:        Intake/Output Summary (Last 24 hours) at 11/16/2019 1407 Last data filed at 11/16/2019 1027 Gross per 24 hour  Intake 3516.87 ml  Output 451 ml  Net 3065.87 ml   Filed Weights   11/08/19 1324 11/15/19 0700 11/16/19 1100  Weight: 113 kg 113.3 kg 111.9 kg    Examination:  General exam: Confused, no acute distress Respiratory system: Clear to auscultation. Respiratory effort normal.  Oxygenating well on room air. Cardiovascular system: S1 & S2 heard, RRR. No JVD, murmurs, rubs, gallops or clicks. No pedal edema. Gastrointestinal system: Abdomen is nondistended, soft and nontender. No organomegaly or masses felt. Normal bowel sounds heard. GU: Foley catheter noted in place Central nervous system: Somnolent, but will open eyes to command but incoherent verbal conversation. No focal neurological deficits. Extremities: Symmetric 5 x 5 power. Skin: No rashes, lesions or ulcers Psychiatry: Confused, poor judgment/insight    Data Reviewed: I have personally reviewed following labs and imaging studies  CBC: Recent Labs  Lab 11/10/19 1730 11/12/19 0235 11/13/19 0239 11/14/19 0218 11/15/19 0337 11/16/19 0335  WBC 43.2* 18.9* 12.5* 11.4* 9.7 8.1  NEUTROABS 36.9*  --   --  9.3*  --   --   HGB 11.6* 9.6* 10.8* 10.6* 10.2* 10.5*  HCT 38.2* 33.1* 36.5* 33.6* 32.2* 33.3*  MCV 105.5* 109.6* 106.4* 98.8 97.6 96.8  PLT 137* 132* 120* 156 123* 568*   Basic Metabolic Panel: Recent Labs  Lab 11/12/19 0235 11/13/19 0239  11/14/19 0421 11/15/19 0337 11/16/19 0335  NA 143 145  143 142 141  K 4.0 5.1 3.9 3.8 3.9  CL 113* 111 108 107 105  CO2 24 25 26 27 26   GLUCOSE 142* 138* 161* 143* 134*  BUN 53* 40* 22 19 15   CREATININE 2.48* 2.09* 1.51* 1.47* 1.52*  CALCIUM 8.5* 9.0 9.5 9.6 9.8   GFR: Estimated Creatinine Clearance: 61.7 mL/min (A) (by C-G formula based on SCr of 1.52 mg/dL (H)). Liver Function Tests: Recent Labs  Lab 11/10/19 1235 11/10/19 1730 11/11/19 0900 11/12/19 0235 11/13/19 0239  AST 96* 71* 50* 39 40  ALT 33 28 29 29  33  ALKPHOS 74 73 121 76 88  BILITOT 1.0 0.6 0.6 0.3 1.2  PROT 6.0* 5.9* 6.0* 5.5* 5.6*  ALBUMIN 3.1* 2.9* 2.7* 2.4* 2.5*   No results for input(s): LIPASE, AMYLASE in the last 168 hours. Recent Labs  Lab 11/10/19 1236 11/11/19 0900  AMMONIA 25 25   Coagulation Profile: No results for input(s): INR, PROTIME in the last 168 hours. Cardiac Enzymes: No results for input(s): CKTOTAL, CKMB, CKMBINDEX, TROPONINI in the last 168 hours. BNP (last 3 results) No results for input(s): PROBNP in the last 8760 hours. HbA1C: No results for input(s): HGBA1C in the last 72 hours. CBG: Recent Labs  Lab 11/13/19 1714  GLUCAP 121*   Lipid Profile: No results for input(s): CHOL, HDL, LDLCALC, TRIG, CHOLHDL, LDLDIRECT in the last 72 hours. Thyroid Function Tests: No results for input(s): TSH, T4TOTAL, FREET4, T3FREE, THYROIDAB in the last 72 hours. Anemia Panel: No results for input(s): VITAMINB12, FOLATE, FERRITIN, TIBC, IRON, RETICCTPCT in the last 72 hours. Sepsis Labs: Recent Labs  Lab 11/10/19 1730 11/11/19 0900 11/12/19 0235 11/13/19 0239 11/14/19 0421  PROCALCITON 22.18 9.75 5.53 3.67 1.70  LATICACIDVEN 1.4  --   --   --   --     Recent Results (from the past 240 hour(s))  Culture, blood (routine x 2)     Status: None   Collection Time: 11/10/19  5:30 PM   Specimen: BLOOD RIGHT HAND  Result Value Ref Range Status   Specimen Description BLOOD RIGHT HAND  Final   Special Requests   Final    BOTTLES DRAWN  AEROBIC ONLY Blood Culture adequate volume   Culture   Final    NO GROWTH 5 DAYS Performed at [facility], [address] [place], [place] [address]    Report Status 11/15/2019 FINAL  Final  Culture, blood (routine x 2)     Status: None   Collection Time: 11/10/19  5:30 PM   Specimen: BLOOD RIGHT ARM  Result Value Ref Range Status   Specimen Description   Final    BLOOD RIGHT ARM Performed at [facility], [place] [address] [place], [place] [address]    Special Requests   Final    BOTTLES DRAWN AEROBIC AND ANAEROBIC Blood Culture adequate volume Performed at [facility] [address], [place], [place] [address]    Culture   Final    NO GROWTH 5 DAYS Performed at [facility], [place] [address] [place], [place] [address]    Report Status 11/15/2019 FINAL  Final  MRSA PCR Screening     Status: None   Collection Time: 11/10/19  9:09 PM   Specimen: Nasal Mucosa; Nasopharyngeal  Result Value Ref Range Status   MRSA by PCR NEGATIVE NEGATIVE Final    Comment:  The GeneXpert MRSA Assay (FDA approved for NASAL specimens only), is one component of a comprehensive MRSA colonization surveillance program. It is not intended to diagnose MRSA infection nor to guide or monitor treatment for MRSA infections. Performed at [facility], [place] [address] [place], [place] [address]   Urine culture     Status: Abnormal   Collection Time: 11/11/19  2:44 AM   Specimen: Urine, Random  Result Value Ref Range Status   Specimen Description   Final    URINE, RANDOM Performed at [facility], [address] [place], [place] [address]    Special Requests   Final    NONE Performed at [facility], [place] [address] [place], [place] [address]    Culture (A)  Final    <10,000 COLONIES/mL INSIGNIFICANT GROWTH Performed at [address] [place], [place] [address]    Report Status  11/12/2019 FINAL  Final         Radiology Studies: No results found.      Scheduled Meds: . aspirin EC  81 mg Oral Daily  . atorvastatin  20 mg Oral q1800  . benztropine  0.5 mg Oral BID  . cefdinir  300 mg Oral Q12H  . cholecalciferol  5,000 Units Oral Daily  . clonazePAM  1 mg Oral QHS  . clotrimazole   Topical BID  . cloZAPine  150 mg Oral BID  . docusate sodium  100 mg Oral Daily  . enoxaparin (LOVENOX) injection  40 mg Subcutaneous QHS  . escitalopram  10 mg Oral QAC breakfast  . feeding supplement (ENSURE ENLIVE)  237 mL Oral BID BM  . fluticasone  1 puff Inhalation BID  . folic acid  1 mg Oral Daily  . Ipratropium-Albuterol  1 puff Inhalation BID  . levothyroxine  100 mcg Oral Q0600  . mouth rinse  15 mL Mouth Rinse BID  . multivitamin with minerals  1 tablet Oral Daily  . nystatin  1 Bottle Topical Daily  . polyethylene glycol  17 g Oral Daily  . senna  1 tablet Oral Daily  . thiamine  100 mg Oral Daily  . vitamin C  500 mg Oral Daily  . zinc sulfate  220 mg Oral Daily   Continuous Infusions: . dextrose 125 mL/hr at 11/16/19 0728     LOS: 22 days    Time spent: 36 minutes spent on chart review, discussion with nursing staff, consultants, updating family and interview/physical exam; more than 50% of that time was spent in counseling and/or coordination of care.    Riven Beebe J [place] DO [facility] 11/16/2019, 2:07 PM

## 2019-11-16 NOTE — Consult Note (Addendum)
Telepsych Consultation   Reason for Consult:  "Reassess psychiatric medications, poorly controlled schizophrenia" Referring Physician:  Dr [place] Location of Patient: [facility] Location of Provider: [facility]  Patient Identification: [name] MRN:  [id number] Principal Diagnosis: Schizophrenia [facility]) Diagnosis:  Principal Problem:   Schizophrenia ([place]) Active Problems:   Sepsis secondary to UTI ([place])   Pneumonia due to severe acute respiratory syndrome coronavirus 2 (SARS-CoV-2)   Obstructive uropathy   Prostatitis   Total Time spent with patient: 30 minutes  Subjective:   [name] is a 68 y.o. male patient admitted with shortness of breath, fever, chills and decreased oxygen saturation.  Patient assessed by nurse practitioner.  Patient unable to fully participate in assessment at this time.  When questioned patient denies homicidal ideations.  Patient oriented to self only at this time.  Patient verbalizes "I need help please help me please help me."  Per RN at bedside patient not eating or drinking at this time.  Per RN patient medications are crushed in applesauce, patient takes with coaxing from staff. Case discussed with Dr. Mallie Darting, patient does not meet inpatient criteria at this time.  Medication recommendations completed, MD aware.  HPI: Patient admitted on 12/9 with shortness of breath, fever and chills.  Patient found to be Covid positive at that time.  Past Psychiatric History: Schizophrenia  Risk to Self:  No Risk to Others:  No Prior Inpatient Therapy:  Unknown Prior Outpatient Therapy:  Yes  Past Medical History:  Past Medical History:  Diagnosis Date   Cellulitis    CHF (congestive heart failure) ([place])    Chronic kidney disease 09/20/2008   Chronic lymphocytic leukemia ([place])    cll dx'd 10/2003   no rx   CLL (chronic lymphoblastic leukemia) 10/2003   COPD (chronic obstructive pulmonary disease) ([facility])    GERD  (gastroesophageal reflux disease)    Hiatal hernia    Hyperlipemia    Hypertension    Lower extremity edema    NHL (non-Hodgkin's lymphoma) ([place]) dx'd 08/2008 rt groin   chemo comp 08/2008   Non Hodgkin's lymphoma ([place]) 08/2008   Non Hodgkin's lymphoma ([place])    Paranoid schizophrenia ([place])    Prolonged Q-T interval on ECG    RBBB 09/17/2017   Schizoaffective disorder ([place])    Sepsis ([place])    Severe left ventricular hypertrophy 10/21/2017   Vitamin D deficiency     Past Surgical History:  Procedure Laterality Date   LYMPH NODE BIOPSY     Family History:  Family History  Problem Relation Age of Onset   Alcohol abuse Father    Family Psychiatric  History: Unknown Social History:  Social History   Substance and Sexual Activity  Alcohol Use No     Social History   Substance and Sexual Activity  Drug Use No    Social History   Socioeconomic History   Marital status: Single    Spouse name: Not on file   Number of children: Not on file   Years of education: Not on file   Highest education level: Not on file  Occupational History   Not on file  Tobacco Use   Smoking status: Never Smoker   Smokeless tobacco: Never Used  Substance and Sexual Activity   Alcohol use: No   Drug use: No   Sexual activity: Never  Other Topics Concern   Not on file  Social History Narrative   Not on file   Social Determinants of  Health   Financial Resource Strain:    Difficulty of Paying Living Expenses: Not on file  Food Insecurity:    Worried About [place] in the Last Year: Not on file   Ran Out of Food in the Last Year: Not on file  Transportation Needs:    Lack of Transportation (Medical): Not on file   Lack of Transportation (Non-Medical): Not on file  Physical Activity:    Days of Exercise per Week: Not on file   Minutes of Exercise per Session: Not on file  Stress:    Feeling of Stress : Not on file  Social Connections:    Frequency of Communication with  Friends and Family: Not on file   Frequency of Social Gatherings with Friends and Family: Not on file   Attends Religious Services: Not on file   Active Member of Clubs or Organizations: Not on file   Attends Archivist Meetings: Not on file   Marital Status: Not on file   Additional Social History:    Allergies:   Allergies  Allergen Reactions   Sulfamethoxazole Rash    Labs:  Results for orders placed or performed during the hospital encounter of 10/25/19 (from the past 48 hour(s))  CBC     Status: Abnormal   Collection Time: 11/15/19  3:37 AM  Result Value Ref Range   WBC 9.7 4.0 - 10.5 K/uL   RBC 3.30 (L) 4.22 - 5.81 MIL/uL   Hemoglobin 10.2 (L) 13.0 - 17.0 g/dL   HCT 32.2 (L) 39.0 - 52.0 %   MCV 97.6 80.0 - 100.0 fL   MCH 30.9 26.0 - 34.0 pg   MCHC 31.7 30.0 - 36.0 g/dL   RDW 13.9 11.5 - 15.5 %   Platelets 123 (L) 150 - 400 K/uL   nRBC 0.0 0.0 - 0.2 %    Comment: Performed at [facility], [place] [address] [place], [place] [address]  Basic metabolic panel     Status: Abnormal   Collection Time: 11/15/19  3:37 AM  Result Value Ref Range   Sodium 142 135 - 145 mmol/L   Potassium 3.8 3.5 - 5.1 mmol/L   Chloride 107 98 - 111 mmol/L   CO2 27 22 - 32 mmol/L   Glucose, Bld 143 (H) 70 - 99 mg/dL   BUN 19 8 - 23 mg/dL   Creatinine, Ser 1.47 (H) 0.61 - 1.24 mg/dL   Calcium 9.6 8.9 - 10.3 mg/dL   GFR calc non Af Amer 49 (L) >60 mL/min   GFR calc Af Amer 57 (L) >60 mL/min   Anion gap 8 5 - 15    Comment: Performed at [facility] [address], [place], [place] [address]  CBC     Status: Abnormal   Collection Time: 11/16/19  3:35 AM  Result Value Ref Range   WBC 8.1 4.0 - 10.5 K/uL   RBC 3.44 (L) 4.22 - 5.81 MIL/uL   Hemoglobin 10.5 (L) 13.0 - 17.0 g/dL   HCT 33.3 (L) 39.0 - 52.0 %   MCV 96.8 80.0 - 100.0 fL   MCH 30.5 26.0 - 34.0 pg   MCHC 31.5 30.0 - 36.0 g/dL   RDW 14.1 11.5 - 15.5 %   Platelets 130 (L) 150 - 400  K/uL   nRBC 0.0 0.0 - 0.2 %    Comment: Performed at [facility], [place] [address] [place], [place] [address]  Basic metabolic panel  Status: Abnormal   Collection Time: 11/16/19  3:35 AM  Result Value Ref Range   Sodium 141 135 - 145 mmol/L   Potassium 3.9 3.5 - 5.1 mmol/L   Chloride 105 98 - 111 mmol/L   CO2 26 22 - 32 mmol/L   Glucose, Bld 134 (H) 70 - 99 mg/dL   BUN 15 8 - 23 mg/dL   Creatinine, Ser 1.52 (H) 0.61 - 1.24 mg/dL   Calcium 9.8 8.9 - 10.3 mg/dL   GFR calc non Af Amer 47 (L) >60 mL/min   GFR calc Af Amer 55 (L) >60 mL/min   Anion gap 10 5 - 15    Comment: Performed at [facility], [place] [address] [place], [place] [address]    Medications:  Current Facility-Administered Medications  Medication Dose Route Frequency Provider Last Rate Last Admin   acetaminophen (TYLENOL) tablet 650 mg  650 mg Oral Q6H PRN Rai, Ripudeep K, MD   650 mg at 11/15/19 1207   aspirin EC tablet 81 mg  81 mg Oral Daily Rai, Ripudeep K, MD   81 mg at 11/15/19 0953   atorvastatin (LIPITOR) tablet 20 mg  20 mg Oral q1800 Rai, Ripudeep K, MD   20 mg at 11/15/19 2138   cefdinir (OMNICEF) capsule 300 mg  300 mg Oral Q12H [place] Eric J, DO       cholecalciferol (VITAMIN D3) tablet 5,000 Units  5,000 Units Oral Daily Rai, Ripudeep K, MD   5,000 Units at 11/15/19 0093   clonazePAM (KLONOPIN) tablet 1 mg  1 mg Oral QHS [place] Donnamarie Poag, DO   1 mg at 11/15/19 2138   clotrimazole (LOTRIMIN) 1 % cream   Topical BID Rai, Vernelle Emerald, MD   Given at 11/15/19 2133   cloZAPine (CLOZARIL) tablet 100 mg  100 mg Oral QHS Rai, Ripudeep K, MD   100 mg at 11/15/19 2303   dextrose 5 % solution   Intravenous Continuous [place] Eric J, [place] 125 mL/hr at 11/16/19 8182 New Bag at 11/16/19 0728   docusate sodium (COLACE) capsule 100 mg  100 mg Oral Daily Rai, Ripudeep K, MD   100 mg at 11/15/19 0952   enoxaparin (LOVENOX) injection 40 mg  40 mg Subcutaneous QHS [place] Eric J, DO   40  mg at 11/15/19 2137   escitalopram (LEXAPRO) tablet 10 mg  10 mg Oral QAC breakfast Rai, Ripudeep K, MD   10 mg at 11/15/19 9937   feeding supplement (ENSURE ENLIVE) (ENSURE ENLIVE) liquid 237 mL  237 mL Oral BID BM Rai, Ripudeep K, MD   237 mL at 11/15/19 2131   fluticasone (FLOVENT HFA) 220 MCG/ACT inhaler 1 puff  1 puff Inhalation BID Rai, Ripudeep K, MD   1 puff at 16/96/78 9381   folic acid (FOLVITE) tablet 1 mg  1 mg Oral Daily Rai, Ripudeep K, MD   1 mg at 11/15/19 0175   Ipratropium-Albuterol (COMBIVENT) respimat 1 puff  1 puff Inhalation Q6H PRN Rai, Ripudeep K, MD       Ipratropium-Albuterol (COMBIVENT) respimat 1 puff  1 puff Inhalation BID [place] Eric J, DO       levothyroxine (SYNTHROID) tablet 100 mcg  100 mcg Oral Q0600 [place] Donnamarie Poag, DO   100 mcg at 11/16/19 0617   LORazepam (ATIVAN) injection 0.5 mg  0.5 mg Intravenous Q12H PRN [place] Eric J, DO       LORazepam (ATIVAN) injection 0.5 mg  0.5 mg Intravenous Once [place]  Donnamarie Poag, DO       MEDLINE mouth rinse  15 mL Mouth Rinse BID [place] Eric J, DO   15 mL at 11/15/19 2142   multivitamin with minerals tablet 1 tablet  1 tablet Oral Daily Rai, Ripudeep K, MD   1 tablet at 11/15/19 9798   nystatin (MYCOSTATIN/NYSTOP) topical powder 1 Bottle  1 Bottle Topical Daily Rai, Ripudeep K, MD   1 Bottle at 11/15/19 0959   polyethylene glycol (MIRALAX / GLYCOLAX) packet 17 g  17 g Oral Daily Rai, Ripudeep K, MD   17 g at 11/15/19 9211   senna (SENOKOT) tablet 8.6 mg  1 tablet Oral Daily Rai, Ripudeep K, MD   8.6 mg at 11/15/19 9417   thiamine (VITAMIN B-1) tablet 100 mg  100 mg Oral Daily Rai, Ripudeep K, MD   100 mg at 11/15/19 4081   vitamin C (ASCORBIC ACID) tablet 500 mg  500 mg Oral Daily Rai, Ripudeep K, MD   500 mg at 11/15/19 4481   zinc sulfate capsule 220 mg  220 mg Oral Daily Rai, Ripudeep K, MD   220 mg at 11/15/19 8563    Musculoskeletal: Strength & Muscle Tone:  Unable to assess Gait & Station:  Unable to  assess Patient leans:  Unable to assess  Psychiatric Specialty Exam: Physical Exam  Nursing note and vitals reviewed. Constitutional: He appears well-developed.  HENT:  Head: Normocephalic.  Cardiovascular: Normal rate.  Respiratory: Effort normal.  Neurological: He is alert.  Psychiatric: His mood appears anxious. His speech is slurred. Cognition and memory are impaired. He expresses inappropriate judgment.    Review of Systems  Constitutional: Negative.   HENT: Negative.   Eyes: Negative.   Respiratory: Negative.   Cardiovascular: Negative.   Gastrointestinal: Negative.   Genitourinary: Negative.   Musculoskeletal: Negative.   Skin: Negative.   Neurological: Negative.   Psychiatric/Behavioral: Positive for behavioral problems. The patient is nervous/anxious.     Blood pressure 130/61, pulse 94, temperature 98.2 F (36.8 C), temperature source Oral, resp. rate 18, height 6' (1.829 m), weight 113.3 kg, SpO2 97 %.Body mass index is 33.88 kg/m.  General Appearance: Disheveled  Eye Contact:  Poor  Speech:  Slurred  Volume:  Normal  Mood:  Anxious  Affect:  Non-Congruent  Thought Process:  Disorganized and Descriptions of Associations: Tangential  Orientation:  Other:  Self only  Thought Content:  Delusions, Rumination and Tangential  Suicidal Thoughts:  No  Homicidal Thoughts:  No  Memory:  Immediate;   Poor  Judgement:  Impaired  Insight:  Lacking  Psychomotor Activity:  Normal  Concentration:  Concentration: Poor and Attention Span: Poor  Recall:  Poor  Fund of Knowledge:   Unable to assess  Language:   Unable to assess  Akathisia:  No  Handed:  Right  AIMS (if indicated):     Assets:  Financial Resources/Insurance Housing Social Support  ADL's:  Impaired  Cognition:  Impaired,  Moderate  Sleep:        Treatment Plan Summary: . Recommend consider increase clozapine to home dose 150mg  BID, add cogentin 0.5 mg BID.  Disposition: No evidence of imminent  risk to self or others at present.   Patient does not meet criteria for psychiatric inpatient admission.  This service was provided via telemedicine using a 2-way, interactive audio and video technology.  Names of all persons participating in this telemedicine service and their role in this encounter. Name: [name] Role: Patient  Name:  Letitia Libra Role: [place], FNP 11/16/2019 10:06 AM     Case discussed and plan agreed upon as outlined above.

## 2019-11-17 DIAGNOSIS — J1282 Pneumonia due to coronavirus disease 2019: Secondary | ICD-10-CM

## 2019-11-17 LAB — BASIC METABOLIC PANEL
Anion gap: 8 (ref 5–15)
BUN: 12 mg/dL (ref 8–23)
CO2: 27 mmol/L (ref 22–32)
Calcium: 9.6 mg/dL (ref 8.9–10.3)
Chloride: 106 mmol/L (ref 98–111)
Creatinine, Ser: 1.41 mg/dL — ABNORMAL HIGH (ref 0.61–1.24)
GFR calc Af Amer: 60 mL/min — ABNORMAL LOW (ref >60)
GFR calc non Af Amer: 52 mL/min — ABNORMAL LOW (ref >60)
Glucose, Bld: 149 mg/dL — ABNORMAL HIGH (ref 70–99)
Potassium: 3.8 mmol/L (ref 3.5–5.1)
Sodium: 141 mmol/L (ref 135–145)

## 2019-11-17 MED ORDER — SODIUM CHLORIDE 0.9% FLUSH
10.0000 mL | INTRAVENOUS | Status: DC | PRN
  Administered 2019-11-25 – 2019-11-28 (×2): 10 mL

## 2019-11-17 MED ORDER — SODIUM CHLORIDE 0.9% FLUSH
10.0000 mL | Freq: Two times a day (BID) | INTRAVENOUS | Status: DC
  Administered 2019-11-17 – 2019-11-27 (×11): 10 mL

## 2019-11-17 MED ORDER — LORAZEPAM 2 MG/ML IJ SOLN
0.5000 mg | Freq: Once | INTRAMUSCULAR | Status: AC
  Administered 2019-11-17: 0.5 mg via INTRAVENOUS
  Filled 2019-11-17: qty 1

## 2019-11-17 NOTE — Progress Notes (Signed)
PROGRESS NOTE    [name]  ZOX:[id number] DOB: 1951/04/04 DOA: 10/25/2019 PCP: [name], FNP    Brief Narrative:   Patient is a 69 year old male, obese, with PMH significant for paranoid schizophrenia, prolonged QT interval, non-Hodgkin's lymphoma, hypertension, hyperlipidemia, COPD, diastolic congestive heart failure and chronic kidney disease. Patient lives in a group home and on12/9 patient was sent to the ED for worsening shortness of breath, fever chills and low O2 sat at 89%.   In the ED, patient was started on O2 by nasal cannula. Work-up showed elevated creatinine to 3.09 (up from 1.67). COVID-19 PCR was positive. Chest x-ray revealed multifocal pneumonia.  Patient was admitted to hospital service for further evaluation and management.  Was initially hospitalized for Covid pneumonia for which he completed the treatment. His hospital course was complicated by AKI, acute urinary retention.  Patient completed a course of Decadron, IV remdesivir.  He continues to be encephalopathic with intermittent agitation and drowsiness, currently awaiting SNF placement.   Assessment & Plan:   Principal Problem:   Schizophrenia ([place]) Active Problems:   Sepsis secondary to UTI ([place])   Pneumonia due to severe acute respiratory syndrome coronavirus 2 (SARS-CoV-2)   Obstructive uropathy   Prostatitis   Acute Hypoxic Respiratory Failure due to Acute Covid 19 Viral Pneumonia during the ongoing 2020 Covid 19 Pandemic - POA Patient presenting with hypoxia on presentation with oxygen saturations 89%.  Covid-19 PCR positive with chest x-ray notable for multifocal pneumonia with increased inflammatory markers.  CT abdomen/pelvis on 11/10/2019 with nodular appearance right lung base with recommendations of surveillance and follow-up noncontrast CT in 6-8 weeks.  Patient completed a 5-day course of remdesivir on 10/29/2019 and a 10-day course of Decadron on 11/03/2019. --Continue  supplemental oxygen, titrate for goal SPO2 greater than 92% --plan to discontinue contact/airborne isolation precautions 11/15/2019; as this will be 21 days from his positive Covid test on 10/25/2019.  Acute kidney injury on CKD stage IV sepsis with shock likely 2/2 UTI versus prostatitis Presenting with a creatinine of 3.09, which is up from his baseline of 1.67.  Renal ultrasound unremarkable with no hydronephrosis or obstruction.  On 11/10/2019, patient with fever and white blood cell count up to 47.2 with a procalcitonin of 22.18.  CT abdomen/pelvis notable for a malpositioned Foley catheter with potential cystitis, a sending urinary tract infection versus prostatitis. --Foley catheter removed on 11/15/2019 --WBC 47.2-->37.1-->18.9-->12.5-->11.4-->9.7-->8.1 --PCT 22.18-->9.75-->5.53-->3.67-->1.70 --Cr 4.34-->3.49-->2.48-->2.09-->1.51-->1.47--1.52-->1.41 --Urine culture with no significant growth --Blood cultures x2: No growth x 5 days --Transition cefepime to cefdinir 300mg  BID, plan 14d total course --Continue to closely monitor urine output, renal function, WBC count  Acute metabolic encephalopathy with intermittent hallucinations/agitation, history of schizoaffective disorder On multiple psych meds clozapine, benztropine, Thorazine, Lamictal, Lexapro at home.  Patient was confused and more somnolent during his hospital course.  Psychiatry was consulted with recommendations to discontinue Cogentin, Thorazine, Lamictal. --Seen by psychiatry in reconsultation today, 11/16/2019 --increased clozapine to 150mg  BID --restarted cogentin 0.5mg  BID --Lexapro 10 mg p.o. daily --Clonazepam 0.5 mg twice daily and 1mg  qHS --Check EKG in am; to assess qTC interval; may need to restart home thorazine given continued confusion and aggitation  Hypernatremia: resolved Sodium trending up, 148, likely 2/2 poor p.o. intake due to somnolence and agitation. --Na 148-->143-->145-->143-->142-->141 --continue  D5W to 125 cc/hour  --monitor sodium levels daily  Acute renal urinary retention Patient had Foley catheter placed on admission for acute urinary retention.  Attempt at removal on 12/16 with recurrent urinary retention.  On 11/10/2019 patient with dislodgment of his catheter which was once again replaced. --Foley catheter discontinued on 11/15/2019 --Continue monitor renal function daily --Bladder scan this afternoon to ensure no significant urinary retention  Drug reaction/fungal infection Currently on lower extremities, left flank, axilla, groin area --continue nystatin topical  Hyperkalemia Resolved  QTC prolongation QTC 478 on EKG 12/20 --Keep potassium> 4, magnesium> 2 --follow BMP daily  Chronic diastolic CHF, compensated 2D echo in 09/2018 showed EF of 65 to 43% with LV diastolic dysfunction  Hyperlipidemia --Continue Lipitor  Hypothyroidism --Continue levothyroxine 100 mcg p.o. daily  Morbid obesity  BMI 33.9, patient advised diet and weight control  pressure injury Stage II, left buttocks, present on admission --Wound care per nursing   DVT prophylaxis: Lovenox Code Status: Full code Family Communication: Updated patient's caregiver/guardian Anne via telephone today Disposition Plan: pending SNF placement, difficult secondary to his underlying psychiatric illness, social work for coordination; Guardian suggests looking into [facility] facility located in [place].   Consultants:   none  Procedures:   none  Antimicrobials:   Cefdinir 12/31>>  Cefepime 12/25 - 12/31  Remdesivir 12/9 - 12/13  Doxycycline 12/9 - 12/14  Ceftriaxone 12/9 - 12/14    Subjective: Patient seen and examined bedside, continues with confusion and agitation.  Sitter present.  Per nursing, only slept 2 hours once again overnight.  Restarted on some of his home antipsychotics yesterday by psychiatry.  History of QTC prolongation, will  repeat EKG in a.m., for possible need of restarting his home Thorazine which can further prolongated QTc interval. Updated patient's caregiver/guardian Webb Silversmith via telephone this afternoon.  No other concerns overnight per nursing staff.  Objective: Vitals:   11/16/19 1100 11/16/19 1341 11/16/19 2043 11/17/19 0627  BP:  (!) 133/98 126/73 106/65  Pulse: 92 93 97 100  Resp:  18 15   Temp:  98.4 F (36.9 C) 97.8 F (36.6 C) 98.3 F (36.8 C)  TempSrc:  Oral Oral Oral  SpO2:  97% 98% 93%  Weight: 111.9 kg     Height:        Intake/Output Summary (Last 24 hours) at 11/17/2019 1156 Last data filed at 11/17/2019 0600 Gross per 24 hour  Intake 3245.19 ml  Output --  Net 3245.19 ml   Filed Weights   11/08/19 1324 11/15/19 0700 11/16/19 1100  Weight: 113 kg 113.3 kg 111.9 kg    Examination:  General exam: Confused, no acute distress, agitated and calling out Respiratory system: Clear to auscultation. Respiratory effort normal.  Oxygenating well on room air. Cardiovascular system: S1 & S2 heard, RRR. No JVD, murmurs, rubs, gallops or clicks. No pedal edema. Gastrointestinal system: Abdomen is nondistended, soft and nontender. No organomegaly or masses felt. Normal bowel sounds heard. GU: Foley catheter noted in place Central nervous system: incoherent verbal conversation. No focal neurological deficits. Extremities: Symmetric 5 x 5 power. Skin: No rashes, lesions or ulcers Psychiatry: Confused, poor judgment/insight    Data Reviewed: I have personally reviewed following labs and imaging studies  CBC: Recent Labs  Lab 11/10/19 1730 11/12/19 0235 11/13/19 0239 11/14/19 0218 11/15/19 0337 11/16/19 0335  WBC 43.2* 18.9* 12.5* 11.4* 9.7 8.1  NEUTROABS 36.9*  --   --  9.3*  --   --   HGB 11.6* 9.6* 10.8* 10.6* 10.2* 10.5*  HCT 38.2* 33.1* 36.5* 33.6* 32.2* 33.3*  MCV 105.5* 109.6* 106.4* 98.8 97.6 96.8  PLT 137* 132* 120* 156 123* 154*   Basic Metabolic  Panel: Recent Labs  Lab  11/13/19 0239 11/14/19 0421 11/15/19 0337 11/16/19 0335 11/17/19 0306  NA 145 143 142 141 141  K 5.1 3.9 3.8 3.9 3.8  CL 111 108 107 105 106  CO2 25 26 27 26 27   GLUCOSE 138* 161* 143* 134* 149*  BUN 40* 22 19 15 12   CREATININE 2.09* 1.51* 1.47* 1.52* 1.41*  CALCIUM 9.0 9.5 9.6 9.8 9.6   GFR: Estimated Creatinine Clearance: 66.6 mL/min (A) (by C-G formula based on SCr of 1.41 mg/dL (H)). Liver Function Tests: Recent Labs  Lab 11/10/19 1235 11/10/19 1730 11/11/19 0900 11/12/19 0235 11/13/19 0239  AST 96* 71* 50* 39 40  ALT 33 28 29 29  33  ALKPHOS 74 73 121 76 88  BILITOT 1.0 0.6 0.6 0.3 1.2  PROT 6.0* 5.9* 6.0* 5.5* 5.6*  ALBUMIN 3.1* 2.9* 2.7* 2.4* 2.5*   No results for input(s): LIPASE, AMYLASE in the last 168 hours. Recent Labs  Lab 11/10/19 1236 11/11/19 0900  AMMONIA 25 25   Coagulation Profile: No results for input(s): INR, PROTIME in the last 168 hours. Cardiac Enzymes: No results for input(s): CKTOTAL, CKMB, CKMBINDEX, TROPONINI in the last 168 hours. BNP (last 3 results) No results for input(s): PROBNP in the last 8760 hours. HbA1C: No results for input(s): HGBA1C in the last 72 hours. CBG: Recent Labs  Lab 11/13/19 1714  GLUCAP 121*   Lipid Profile: No results for input(s): CHOL, HDL, LDLCALC, TRIG, CHOLHDL, LDLDIRECT in the last 72 hours. Thyroid Function Tests: No results for input(s): TSH, T4TOTAL, FREET4, T3FREE, THYROIDAB in the last 72 hours. Anemia Panel: No results for input(s): VITAMINB12, FOLATE, FERRITIN, TIBC, IRON, RETICCTPCT in the last 72 hours. Sepsis Labs: Recent Labs  Lab 11/10/19 1730 11/11/19 0900 11/12/19 0235 11/13/19 0239 11/14/19 0421  PROCALCITON 22.18 9.75 5.53 3.67 1.70  LATICACIDVEN 1.4  --   --   --   --     Recent Results (from the past 240 hour(s))  Culture, blood (routine x 2)     Status: None   Collection Time: 11/10/19  5:30 PM   Specimen: BLOOD RIGHT HAND  Result Value Ref Range Status   Specimen  Description BLOOD RIGHT HAND  Final   Special Requests   Final    BOTTLES DRAWN AEROBIC ONLY Blood Culture adequate volume   Culture   Final    NO GROWTH 5 DAYS Performed at [facility], [address], [place], [place] [address]    Report Status 11/15/2019 FINAL  Final  Culture, blood (routine x 2)     Status: None   Collection Time: 11/10/19  5:30 PM   Specimen: BLOOD RIGHT ARM  Result Value Ref Range Status   Specimen Description   Final    BLOOD RIGHT ARM Performed at [facility], [place] [address] [place], [place] [address]    Special Requests   Final    BOTTLES DRAWN AEROBIC AND ANAEROBIC Blood Culture adequate volume Performed at [facility] [address] [place], [place] [address]    Culture   Final    NO GROWTH 5 DAYS Performed at [facility], [place] [address] [place], [place] [address]    Report Status 11/15/2019 FINAL  Final  MRSA PCR Screening     Status: None   Collection Time: 11/10/19  9:09 PM   Specimen: Nasal Mucosa; Nasopharyngeal  Result Value Ref Range Status   MRSA by PCR NEGATIVE NEGATIVE  Final    Comment:        The GeneXpert MRSA Assay (FDA approved for NASAL specimens only), is one component of a comprehensive MRSA colonization surveillance program. It is not intended to diagnose MRSA infection nor to guide or monitor treatment for MRSA infections. Performed at [facility], [place] [address], [place], [place] [address]   Urine culture     Status: Abnormal   Collection Time: 11/11/19  2:44 AM   Specimen: Urine, Random  Result Value Ref Range Status   Specimen Description   Final    URINE, RANDOM Performed at [facility], [address] [place], [place] [address]    Special Requests   Final    NONE Performed at [facility], [place] [address], [place], [place] [address]    Culture (A)  Final    <10,000 COLONIES/mL INSIGNIFICANT GROWTH Performed at [facility] [address] [place], [place] [address]    Report Status 11/12/2019 FINAL  Final         Radiology Studies: No results found.      Scheduled Meds: . aspirin EC  81 mg Oral Daily  . atorvastatin  20 mg Oral q1800  . benztropine  0.5 mg Oral BID  . cefdinir  300 mg Oral Q12H  . cholecalciferol  5,000 Units Oral Daily  . clonazePAM  1 mg Oral QHS  . clotrimazole   Topical BID  . cloZAPine  150 mg Oral BID  . docusate sodium  100 mg Oral Daily  . enoxaparin (LOVENOX) injection  40 mg Subcutaneous QHS  . escitalopram  10 mg Oral QAC breakfast  . feeding supplement (ENSURE ENLIVE)  237 mL Oral BID BM  . fluticasone  1 puff Inhalation BID  . folic acid  1 mg Oral Daily  . Ipratropium-Albuterol  1 puff Inhalation BID  . levothyroxine  100 mcg Oral Q0600  . mouth rinse  15 mL Mouth Rinse BID  . multivitamin with minerals  1 tablet Oral Daily  . nystatin  1 Bottle Topical Daily  . polyethylene glycol  17 g Oral Daily  . senna  1 tablet Oral Daily  . thiamine  100 mg Oral Daily  . vitamin C  500 mg Oral Daily  . zinc sulfate  220 mg Oral Daily   Continuous Infusions: . dextrose 125 mL/hr at 11/17/19 0612     LOS: 23 days    Time spent: 36 minutes spent on chart review, discussion with nursing staff, consultants, updating family and interview/physical exam; more than 50% of that time was spent in counseling and/or coordination of care.    Yasaman Kolek J [place] DO [facility] 11/17/2019, 11:56 AM

## 2019-11-17 NOTE — Progress Notes (Signed)
Physical Therapy Treatment Patient Details Name: [name] MRN: [id number] DOB: 04-28-1951 Today's Date: 11/17/2019    History of Present Illness 69 yo male admitted with COVID (+). Hx of schizoaffective d/o, NHL, CKD, obesity, CHF, COPD. Pt was admitted from a group home.    PT Comments    Pt in bed B wrist restraints and mittens.  Difficult to arouse.  AxO x 1 with brief mumbled words but for most part lethargic, groggy and "glazed" eyes.  Assisted OOB to [facility].  General bed mobility comments: falling forward with no corrective reaction.  Assisted to [facility] to attempt increased alertness. General transfer comment: from elvated bed to [facility] then back to bed + 2 TOTAL Assist.  General Gait Details: unable to attempt gait due to poor transfer ability and lethargy.   Follow Up Recommendations  SNF     Equipment Recommendations  None recommended by PT    Recommendations for Other Services       Precautions / Restrictions Precautions Precautions: Fall Precaution Comments: Schizophrenia resides in a Group Home Restrictions Weight Bearing Restrictions: No    Mobility  Bed Mobility Overal bed mobility: Needs Assistance Bed Mobility: Supine to Sit;Sit to Supine     Supine to sit: Total assist;+2 for physical assistance;+2 for safety/equipment Sit to supine: Total assist;+2 for physical assistance;+2 for safety/equipment   General bed mobility comments: falling forward with no corrective reaction.  Assisted to [facility] to attempt increased alertness.  Transfers Overall transfer level: Needs assistance Equipment used: None;2 person hand held assist Transfers: Stand Pivot Transfers   Stand pivot transfers: Total assist;+2 physical assistance;+2 safety/equipment;From elevated surface       General transfer comment: from elvated bed to [facility] then back to bed + 2 TOTAL Assist.  Ambulation/Gait             General Gait Details: unable to attempt gait due to poor transfer ability  and lethargy.   Stairs             Wheelchair Mobility    Modified Rankin (Stroke Patients Only)       Balance Overall balance assessment: Needs assistance Sitting-balance support: Feet supported Sitting balance-Leahy Scale: Fair Sitting balance - Comments: min guard assist, returns to sidelying abruptly                                    Cognition Arousal/Alertness: Lethargic Behavior During Therapy: Flat affect Overall Cognitive Status: Impaired/Different from baseline                                 General Comments: lethargic, glazed, mumbles a few words      Exercises      General Comments        Pertinent Vitals/Pain Pain Assessment: No/denies pain Faces Pain Scale: No hurt    Home Living                      Prior Function            PT Goals (current goals can now be found in the care plan section) Acute Rehab PT Goals Patient Stated Goal: unable to state Progress towards PT goals: Progressing toward goals    Frequency    Min 2X/week      PT Plan Current plan remains appropriate  Co-evaluation              AM-PAC PT "6 Clicks" Mobility   Outcome Measure  Help needed turning from your back to your side while in a flat bed without using bedrails?: Total Help needed moving from lying on your back to sitting on the side of a flat bed without using bedrails?: Total Help needed moving to and from a bed to a chair (including a wheelchair)?: Total Help needed standing up from a chair using your arms (e.g., wheelchair or bedside chair)?: Total Help needed to walk in hospital room?: Total Help needed climbing 3-5 steps with a railing? : Total 6 Click Score: 6    End of Session Equipment Utilized During Treatment: Gait belt Activity Tolerance: Other (comment)(lethargic, sedated) Patient left: in bed;with call bell/phone within reach;with bed alarm set;with nursing/sitter in room Nurse  Communication: Mobility status(pt had a BM) PT Visit Diagnosis: Unsteadiness on feet (R26.81);Difficulty in walking, not elsewhere classified (R26.2);Muscle weakness (generalized) (M62.81)     Time: 0962-8366 PT Time Calculation (min) (ACUTE ONLY): 34 min  Charges:  $Therapeutic Activity: 23-37 mins                     Rica Koyanagi  PTA Acute  Rehabilitation Services Pager      [phone] Office      [phone]

## 2019-11-17 NOTE — Plan of Care (Signed)
  Problem: Clinical Measurements: Goal: Will remain free from infection 11/17/2019 1249 by Keturah Shavers, RN Outcome: Progressing 11/17/2019 1249 by Keturah Shavers, RN Outcome: Not Progressing Goal: Diagnostic test results will improve 11/17/2019 1249 by Keturah Shavers, RN Outcome: Progressing 11/17/2019 1249 by Keturah Shavers, RN Outcome: Not Progressing Goal: Respiratory complications will improve 11/17/2019 1249 by Keturah Shavers, RN Outcome: Progressing 11/17/2019 1249 by Keturah Shavers, RN Outcome: Not Progressing Goal: Cardiovascular complication will be avoided 11/17/2019 1249 by Keturah Shavers, RN Outcome: Progressing 11/17/2019 1249 by Keturah Shavers, RN Outcome: Not Progressing   Problem: Elimination: Goal: Will not experience complications related to bowel motility 11/17/2019 1249 by Keturah Shavers, RN Outcome: Progressing 11/17/2019 1249 by Keturah Shavers, RN Outcome: Not Progressing Goal: Will not experience complications related to urinary retention 11/17/2019 1249 by Keturah Shavers, RN Outcome: Progressing 11/17/2019 1249 by Keturah Shavers, RN Outcome: Not Progressing   Problem: Pain Managment: Goal: General experience of comfort will improve 11/17/2019 1249 by Keturah Shavers, RN Outcome: Progressing 11/17/2019 1249 by Keturah Shavers, RN Outcome: Not Progressing   Problem: Skin Integrity: Goal: Risk for impaired skin integrity will decrease 11/17/2019 1249 by Keturah Shavers, RN Outcome: Progressing 11/17/2019 1249 by Keturah Shavers, RN Outcome: Not Progressing

## 2019-11-17 NOTE — Progress Notes (Signed)
Spoke with Pt's guardian [name]'s and updated. Also notified of Pt's transfer to 1308.

## 2019-11-17 NOTE — Evaluation (Signed)
Occupational Therapy Evaluation Patient Details Name: [name] MRN: [id number] DOB: 1950-12-22 Today's Date: 11/17/2019    History of Present Illness 69 yo male admitted with COVID (+). Hx of schizoaffective d/o, NHL, CKD, obesity, CHF, COPD. Pt was admitted from a group home.   Clinical Impression   Pt repeating, "I'm miserable. I haven't slept. I need help." Pt cooperative. Assisted to sit EOB and drank Coke from straw. Min guard assist for sitting balance, but tolerated for only x 7 minutes before returning to sidelying without notice. Mitts and restraints reapplied.    Follow Up Recommendations  SNF;Supervision/Assistance - 24 hour    Equipment Recommendations  None recommended by OT    Recommendations for Other Services       Precautions / Restrictions Precautions Precautions: Fall Precaution Comments: Schizophrenia resides in a Group Home Restrictions Weight Bearing Restrictions: No      Mobility Bed Mobility Overal bed mobility: Needs Assistance Bed Mobility: Supine to Sit;Sit to Supine     Supine to sit: Max assist Sit to supine: Mod assist   General bed mobility comments: assisted for LEs over EOB and to raise trunk and for LEs back into bed  Transfers                 General transfer comment: deferred    Balance Overall balance assessment: Needs assistance Sitting-balance support: Feet supported Sitting balance-Leahy Scale: Fair Sitting balance - Comments: min guard assist, returns to sidelying abruptly                                   ADL either performed or assessed with clinical judgement   ADL   Eating/Feeding: Set up;Sitting(drank with straw while seated at EOB)                   Lower Body Dressing: Total assistance;Bed level(for socks)                       Vision         Perception     Praxis      Pertinent Vitals/Pain Pain Assessment: No/denies pain Faces Pain Scale: No hurt      Hand Dominance     Extremity/Trunk Assessment             Communication     Cognition Arousal/Alertness: Lethargic Behavior During Therapy: Flat affect Overall Cognitive Status: Impaired/Different from baseline                                    General Comments       Exercises     Shoulder Instructions      Home Living                                          Prior Functioning/Environment                   OT Problem List:        OT Treatment/Interventions:      OT Goals(Current goals can be found in the care plan section) Acute Rehab OT Goals Patient Stated Goal: unable to state OT Goal Formulation: Patient unable to participate in goal setting Time For  Goal Achievement: 12/01/19  OT Frequency: Min 2X/week   Barriers to D/C:            Co-evaluation              AM-PAC OT "6 Clicks" Daily Activity     Outcome Measure Help from another person eating meals?: A Little Help from another person taking care of personal grooming?: A Lot Help from another person toileting, which includes using toliet, bedpan, or urinal?: Total Help from another person bathing (including washing, rinsing, drying)?: Total Help from another person to put on and taking off regular upper body clothing?: Total Help from another person to put on and taking off regular lower body clothing?: Total 6 Click Score: 9   End of Session Nurse Communication: (aware IV line is not running)  Activity Tolerance: Patient limited by fatigue Patient left: in bed;with call bell/phone within reach;with bed alarm set  OT Visit Diagnosis: Unsteadiness on feet (R26.81);Muscle weakness (generalized) (M62.81);Other abnormalities of gait and mobility (R26.89);Other symptoms and signs involving cognitive function                Time: 0677-0340 OT Time Calculation (min): 14 min Charges:  OT General Charges $OT Visit: 1 Visit OT  Treatments $Therapeutic Activity: 8-22 mins  Nestor Lewandowsky, OTR/L Acute Rehabilitation Services Pager: [phone] Office: [phone]  Malka So 11/17/2019, 3:28 PM

## 2019-11-18 LAB — CBC
HCT: 32.1 % — ABNORMAL LOW (ref 39.0–52.0)
Hemoglobin: 10.3 g/dL — ABNORMAL LOW (ref 13.0–17.0)
MCH: 31.2 pg (ref 26.0–34.0)
MCHC: 32.1 g/dL (ref 30.0–36.0)
MCV: 97.3 fL (ref 80.0–100.0)
Platelets: 172 10*3/uL (ref 150–400)
RBC: 3.3 MIL/uL — ABNORMAL LOW (ref 4.22–5.81)
RDW: 14.1 % (ref 11.5–15.5)
WBC: 9.8 10*3/uL (ref 4.0–10.5)
nRBC: 0 % (ref 0.0–0.2)

## 2019-11-18 LAB — BASIC METABOLIC PANEL
Anion gap: 9 (ref 5–15)
BUN: 17 mg/dL (ref 8–23)
CO2: 29 mmol/L (ref 22–32)
Calcium: 9.8 mg/dL (ref 8.9–10.3)
Chloride: 103 mmol/L (ref 98–111)
Creatinine, Ser: 1.47 mg/dL — ABNORMAL HIGH (ref 0.61–1.24)
GFR calc Af Amer: 57 mL/min — ABNORMAL LOW (ref >60)
GFR calc non Af Amer: 49 mL/min — ABNORMAL LOW (ref >60)
Glucose, Bld: 173 mg/dL — ABNORMAL HIGH (ref 70–99)
Potassium: 4.1 mmol/L (ref 3.5–5.1)
Sodium: 141 mmol/L (ref 135–145)

## 2019-11-18 MED ORDER — SALINE SPRAY 0.65 % NA SOLN
1.0000 | NASAL | Status: DC | PRN
  Administered 2019-11-19: 1 via NASAL
  Filled 2019-11-18: qty 44

## 2019-11-18 NOTE — Progress Notes (Signed)
PROGRESS NOTE    JEMIAH ELLENBURG  UVO:[id number] DOB: 07/07/1951 DOA: 10/25/2019 PCP: [name], FNP    Brief Narrative:  69 year old male, obese, with PMH significant for paranoid schizophrenia, prolonged QT interval, non-Hodgkin's lymphoma, hypertension, hyperlipidemia, COPD, diastolic congestive heart failure and chronic kidney disease. Patient lives in a group home and on12/9 patient was sent to the ED for worsening shortness of breath, fever chills and low O2 sat at 89%.   In the ED, patient was started on O2 by nasal cannula. Work-up showed elevated creatinine to 3.09 (up from 1.67). COVID-19 PCR was positive. Chest x-ray revealed multifocal pneumonia.  Patient was admitted to hospital service for further evaluation and management.  Was initially hospitalized for Covid pneumonia for which he completed the treatment. His hospital course was complicated by AKI, acute urinary retention.  Patient completed a course of Decadron, IV remdesivir.  He continues to be encephalopathic with intermittent agitation and drowsiness, currently awaiting SNF placement.  Assessment & Plan:   Principal Problem:   Schizophrenia ([place]) Active Problems:   Sepsis secondary to UTI ([place])   Pneumonia due to severe acute respiratory syndrome coronavirus 2 (SARS-CoV-2)   Obstructive uropathy   Prostatitis   Acute Hypoxic Respiratory Failure due to Acute Covid 19 Viral Pneumonia during the ongoing 2020 Covid 19 Pandemic - POA -Patient presenting with hypoxia on presentation with oxygen saturations 89%.  Covid-19 PCR positive with chest x-ray notable for multifocal pneumonia with increased inflammatory markers.   -CT abdomen/pelvis on 11/10/2019 with nodular appearance right lung base with recommendations of surveillance and follow-up noncontrast CT in 6-8 weeks.  Patient completed a 5-day course of remdesivir on 10/29/2019 and a 10-day course of Decadron on 11/03/2019. --Continue supplemental  oxygen, titrate for goal SPO2 greater than 92% --Have discontinued contact/airborne isolation precautions 11/15/2019; as this is 21 days from his positive Covid test on 10/25/2019.  Acute kidney injury on CKD stage IV sepsis with shock likely 2/2 UTI versus prostatitis Presenting with a creatinine of 3.09, which is up from his baseline of 1.67.  Renal ultrasound unremarkable with no hydronephrosis or obstruction.  On 11/10/2019, patient with fever and white blood cell count up to 47.2 with a procalcitonin of 22.18.  CT abdomen/pelvis notable for a malpositioned Foley catheter with potential cystitis, a sending urinary tract infection versus prostatitis. --Foley catheter removed on 11/15/2019 --Urine culture with no significant growth --Blood cultures x2: No growth x 5 days --Transitioned cefepime to cefdinir 300mg  BID, plan 14d total course --repeat bmet in AM  Acute metabolic encephalopathy with intermittent hallucinations/agitation, history of schizoaffective disorder On multiple psych medsclozapine, benztropine, Thorazine, Lamictal, Lexapro at home.  Patient was confused and more somnolent during his hospital course.  Psychiatry was consulted with recommendations to discontinue Cogentin, Thorazine, Lamictal. --Seen by psychiatry in reconsultation today, 11/16/2019 --increased clozapine to 150mg  BID per Psych --started cogentin 0.5mg  BID  --Lexapro 10 mg p.o. daily --Clonazepam 0.5 mg twice daily and 1mg  qHS  Hypernatremia Sodium trending up, 148, likely 2/2 poor p.o. intake due to somnolence and agitation. --Na 148-->143-->145-->143-->142-->141 --continue D5W to 125 cc/hour  --monitor sodium levels daily  Acute renal urinary retention Patient had Foley catheter placed on admission for acute urinary retention.  Attempt at removal on 1216 with recurrent urinary retention.  On 11/10/2019 patient with dislodgment of his catheter which was once again replaced. --Foley catheter  discontinued on 11/15/2019 --Continue monitor renal function daily --Bladder scan this afternoon to ensure no significant urinary retention  Drug reaction/fungal  infection Currently on lower extremities, left flank, axilla, groin area --continue nystatin topical  Hyperkalemia -Normalized -Will repeat bmet in AM  QTC prolongation -QTC 478 on EKG 12/20 --Keep potassium> 4, magnesium> 2 --follow BMP daily  Chronic diastolicCHF, compensated -2D echo in 09/2018 showed EF of 65 to 67% with LV diastolic dysfunction -Stable at this time  Hyperlipidemia --Continue Lipitor as tolerated  Hypothyroidism --Continue levothyroxine 100 mcg p.o. daily -Seems stable at this time  Morbid obesity  -BMI 33.9, patient advised diet and weight control  pressure injury Stage II, left buttocks, present on admission --Wound care per nursing  DVT prophylaxis: Lovenox subq Code Status: Full Family Communication: Pt in room,family not at bedside Disposition Plan: Uncertain at this time  Consultants:   Psychiatry  Procedures:     Antimicrobials: Anti-infectives (From admission, onward)   Start     Dose/Rate Route Frequency Ordered Stop   11/16/19 1000  cefdinir (OMNICEF) capsule 300 mg     300 mg Oral Every 12 hours 11/16/19 0858 11/24/19 0959   11/14/19 2200  ceFEPIme (MAXIPIME) 2 g in sodium chloride 0.9 % 100 mL IVPB  Status:  Discontinued     2 g 200 mL/hr over 30 Minutes Intravenous Every 12 hours 11/14/19 1204 11/16/19 0858   11/10/19 1700  ceFEPIme (MAXIPIME) 2 g in sodium chloride 0.9 % 100 mL IVPB  Status:  Discontinued     2 g 200 mL/hr over 30 Minutes Intravenous Daily 11/10/19 1648 11/14/19 1204   10/26/19 1000  remdesivir 100 mg in sodium chloride 0.9 % 100 mL IVPB  Status:  Discontinued     100 mg 200 mL/hr over 30 Minutes Intravenous Daily 10/25/19 2229 10/25/19 2254   10/26/19 1000  remdesivir 100 mg in sodium chloride 0.9 % 100 mL IVPB     100 mg 200 mL/hr  over 30 Minutes Intravenous Daily 10/25/19 2135 10/29/19 1045   10/25/19 2230  cefTRIAXone (ROCEPHIN) 1 g in sodium chloride 0.9 % 100 mL IVPB  Status:  Discontinued     1 g 200 mL/hr over 30 Minutes Intravenous Every 24 hours 10/25/19 2228 10/30/19 1746   10/25/19 2230  doxycycline (VIBRA-TABS) tablet 100 mg  Status:  Discontinued     100 mg Oral Every 12 hours 10/25/19 2229 10/30/19 1746   10/25/19 2228  remdesivir 200 mg in sodium chloride 0.9% 250 mL IVPB  Status:  Discontinued     200 mg 580 mL/hr over 30 Minutes Intravenous Once 10/25/19 2229 10/25/19 2254   10/25/19 2200  remdesivir 200 mg in sodium chloride 0.9% 250 mL IVPB     200 mg 580 mL/hr over 30 Minutes Intravenous Once 10/25/19 2135 10/26/19 0256       Subjective: Complains of dry nose  Objective: Vitals:   11/17/19 2049 11/18/19 0531 11/18/19 0732 11/18/19 1430  BP: 128/69 101/64  (!) 115/59  Pulse: 89 65  93  Resp: 16 18    Temp: 98.3 F (36.8 C)   98.6 F (37 C)  TempSrc: Oral   Oral  SpO2: 97%  95% 98%  Weight:      Height:        Intake/Output Summary (Last 24 hours) at 11/18/2019 1639 Last data filed at 11/18/2019 1452 Gross per 24 hour  Intake 240 ml  Output 2200 ml  Net -1960 ml   Filed Weights   11/08/19 1324 11/15/19 0700 11/16/19 1100  Weight: 113 kg 113.3 kg 111.9 kg    Examination:  General exam: Appears calm and comfortable  Respiratory system: Clear to auscultation. Respiratory effort normal. Cardiovascular system: S1 & S2 heard, Regular Gastrointestinal system: Abdomen is nondistended, soft and nontender. No organomegaly or masses felt. Normal bowel sounds heard. Central nervous system: Alert and oriented. No focal neurological deficits. Extremities: Symmetric 5 x 5 power. Skin: No rashes, lesions Psychiatry: difficult to assess given current mentation.   Data Reviewed: I have personally reviewed following labs and imaging studies  CBC: Recent Labs  Lab 11/13/19 0239  11/14/19 0218 11/15/19 0337 11/16/19 0335 11/18/19 0524  WBC 12.5* 11.4* 9.7 8.1 9.8  NEUTROABS  --  9.3*  --   --   --   HGB 10.8* 10.6* 10.2* 10.5* 10.3*  HCT 36.5* 33.6* 32.2* 33.3* 32.1*  MCV 106.4* 98.8 97.6 96.8 97.3  PLT 120* 156 123* 130* 622   Basic Metabolic Panel: Recent Labs  Lab 11/14/19 0421 11/15/19 0337 11/16/19 0335 11/17/19 0306 11/18/19 0524  NA 143 142 141 141 141  K 3.9 3.8 3.9 3.8 4.1  CL 108 107 105 106 103  CO2 26 27 26 27 29   GLUCOSE 161* 143* 134* 149* 173*  BUN 22 19 15 12 17   CREATININE 1.51* 1.47* 1.52* 1.41* 1.47*  CALCIUM 9.5 9.6 9.8 9.6 9.8   GFR: Estimated Creatinine Clearance: 63.8 mL/min (A) (by C-G formula based on SCr of 1.47 mg/dL (H)). Liver Function Tests: Recent Labs  Lab 11/12/19 0235 11/13/19 0239  AST 39 40  ALT 29 33  ALKPHOS 76 88  BILITOT 0.3 1.2  PROT 5.5* 5.6*  ALBUMIN 2.4* 2.5*   No results for input(s): LIPASE, AMYLASE in the last 168 hours. No results for input(s): AMMONIA in the last 168 hours. Coagulation Profile: No results for input(s): INR, PROTIME in the last 168 hours. Cardiac Enzymes: No results for input(s): CKTOTAL, CKMB, CKMBINDEX, TROPONINI in the last 168 hours. BNP (last 3 results) No results for input(s): PROBNP in the last 8760 hours. HbA1C: No results for input(s): HGBA1C in the last 72 hours. CBG: Recent Labs  Lab 11/13/19 1714  GLUCAP 121*   Lipid Profile: No results for input(s): CHOL, HDL, LDLCALC, TRIG, CHOLHDL, LDLDIRECT in the last 72 hours. Thyroid Function Tests: No results for input(s): TSH, T4TOTAL, FREET4, T3FREE, THYROIDAB in the last 72 hours. Anemia Panel: No results for input(s): VITAMINB12, FOLATE, FERRITIN, TIBC, IRON, RETICCTPCT in the last 72 hours. Sepsis Labs: Recent Labs  Lab 11/12/19 0235 11/13/19 0239 11/14/19 0421  PROCALCITON 5.53 3.67 1.70    Recent Results (from the past 240 hour(s))  Culture, blood (routine x 2)     Status: None   Collection  Time: 11/10/19  5:30 PM   Specimen: BLOOD RIGHT HAND  Result Value Ref Range Status   Specimen Description BLOOD RIGHT HAND  Final   Special Requests   Final    BOTTLES DRAWN AEROBIC ONLY Blood Culture adequate volume   Culture   Final    NO GROWTH 5 DAYS Performed at [facility], [place] [address], [place], [place] [address]    Report Status 11/15/2019 FINAL  Final  Culture, blood (routine x 2)     Status: None   Collection Time: 11/10/19  5:30 PM   Specimen: BLOOD RIGHT ARM  Result Value Ref Range Status   Specimen Description   Final    BLOOD RIGHT ARM Performed at [facility], [place] [address], [place], [place] [address]    Special Requests  Final    BOTTLES DRAWN AEROBIC AND ANAEROBIC Blood Culture adequate volume Performed at [facility] [address], [place], [place] [address]    Culture   Final    NO GROWTH 5 DAYS Performed at [facility], [place] [address] [place], [place] [address]    Report Status 11/15/2019 FINAL  Final  MRSA PCR Screening     Status: None   Collection Time: 11/10/19  9:09 PM   Specimen: Nasal Mucosa; Nasopharyngeal  Result Value Ref Range Status   MRSA by PCR NEGATIVE NEGATIVE Final    Comment:        The GeneXpert MRSA Assay (FDA approved for NASAL specimens only), is one component of a comprehensive MRSA colonization surveillance program. It is not intended to diagnose MRSA infection nor to guide or monitor treatment for MRSA infections. Performed at [facility], [place] [address] [place], [place] [address]   Urine culture     Status: Abnormal   Collection Time: 11/11/19  2:44 AM   Specimen: Urine, Random  Result Value Ref Range Status   Specimen Description   Final    URINE, RANDOM Performed at [facility], [place] [place], [place] [address]    Special Requests   Final    NONE Performed at [facility], [place] [address] [place],  [place] [address]    Culture (A)  Final    <10,000 COLONIES/mL INSIGNIFICANT GROWTH Performed at [facility] [address] [place], [place] [address]    Report Status 11/12/2019 FINAL  Final     Radiology Studies: No results found.  Scheduled Meds: . aspirin EC  81 mg Oral Daily  . atorvastatin  20 mg Oral q1800  . benztropine  0.5 mg Oral BID  . cefdinir  300 mg Oral Q12H  . cholecalciferol  5,000 Units Oral Daily  . clonazePAM  1 mg Oral QHS  . clotrimazole   Topical BID  . cloZAPine  150 mg Oral BID  . docusate sodium  100 mg Oral Daily  . enoxaparin (LOVENOX) injection  40 mg Subcutaneous QHS  . escitalopram  10 mg Oral QAC breakfast  . feeding supplement (ENSURE ENLIVE)  237 mL Oral BID BM  . fluticasone  1 puff Inhalation BID  . folic acid  1 mg Oral Daily  . Ipratropium-Albuterol  1 puff Inhalation BID  . levothyroxine  100 mcg Oral Q0600  . mouth rinse  15 mL Mouth Rinse BID  . multivitamin with minerals  1 tablet Oral Daily  . nystatin  1 Bottle Topical Daily  . polyethylene glycol  17 g Oral Daily  . senna  1 tablet Oral Daily  . sodium chloride flush  10-40 mL Intracatheter Q12H  . thiamine  100 mg Oral Daily  . vitamin C  500 mg Oral Daily  . zinc sulfate  220 mg Oral Daily   Continuous Infusions: . dextrose 125 mL/hr at 11/18/19 1422     LOS: 24 days   Marylu Lund, MD [facility] Pager On Amion  If 7PM-7AM, please contact night-coverage 11/18/2019, 4:39 PM

## 2019-11-19 LAB — CBC
HCT: 32.8 % — ABNORMAL LOW (ref 39.0–52.0)
Hemoglobin: 10.4 g/dL — ABNORMAL LOW (ref 13.0–17.0)
MCH: 31.3 pg (ref 26.0–34.0)
MCHC: 31.7 g/dL (ref 30.0–36.0)
MCV: 98.8 fL (ref 80.0–100.0)
Platelets: 187 10*3/uL (ref 150–400)
RBC: 3.32 MIL/uL — ABNORMAL LOW (ref 4.22–5.81)
RDW: 14.6 % (ref 11.5–15.5)
WBC: 10.6 10*3/uL — ABNORMAL HIGH (ref 4.0–10.5)
nRBC: 0 % (ref 0.0–0.2)

## 2019-11-19 LAB — BASIC METABOLIC PANEL
Anion gap: 7 (ref 5–15)
BUN: 18 mg/dL (ref 8–23)
CO2: 30 mmol/L (ref 22–32)
Calcium: 9.6 mg/dL (ref 8.9–10.3)
Chloride: 104 mmol/L (ref 98–111)
Creatinine, Ser: 1.63 mg/dL — ABNORMAL HIGH (ref 0.61–1.24)
GFR calc Af Amer: 50 mL/min — ABNORMAL LOW (ref >60)
GFR calc non Af Amer: 43 mL/min — ABNORMAL LOW (ref >60)
Glucose, Bld: 156 mg/dL — ABNORMAL HIGH (ref 70–99)
Potassium: 3.7 mmol/L (ref 3.5–5.1)
Sodium: 141 mmol/L (ref 135–145)

## 2019-11-19 MED ORDER — QUETIAPINE FUMARATE 25 MG PO TABS
25.0000 mg | ORAL_TABLET | Freq: Every day | ORAL | Status: DC
  Administered 2019-11-19 – 2019-11-27 (×9): 25 mg via ORAL
  Filled 2019-11-19 (×9): qty 1

## 2019-11-19 NOTE — Progress Notes (Signed)
PROGRESS NOTE    [name]  ENI:[id number] DOB: 1951-06-08 DOA: 10/25/2019 PCP: [name], FNP    Brief Narrative:  69 year old male, obese, with PMH significant for paranoid schizophrenia, prolonged QT interval, non-Hodgkin's lymphoma, hypertension, hyperlipidemia, COPD, diastolic congestive heart failure and chronic kidney disease. Patient lives in a group home and on12/9 patient was sent to the ED for worsening shortness of breath, fever chills and low O2 sat at 89%.   In the ED, patient was started on O2 by nasal cannula. Work-up showed elevated creatinine to 3.09 (up from 1.67). COVID-19 PCR was positive. Chest x-ray revealed multifocal pneumonia.  Patient was admitted to hospital service for further evaluation and management.  Was initially hospitalized for Covid pneumonia for which he completed the treatment. His hospital course was complicated by AKI, acute urinary retention.  Patient completed a course of Decadron, IV remdesivir.  He continues to be encephalopathic with intermittent agitation and drowsiness, currently awaiting SNF placement.  Assessment & Plan:   Principal Problem:   Schizophrenia ([place]) Active Problems:   Sepsis secondary to UTI ([place])   Pneumonia due to severe acute respiratory syndrome coronavirus 2 (SARS-CoV-2)   Obstructive uropathy   Prostatitis   Acute Hypoxic Respiratory Failure due to Acute Covid 19 Viral Pneumonia during the ongoing 2020 Covid 19 Pandemic - POA -Patient presenting with hypoxia on presentation with oxygen saturations 89%.  Covid-19 PCR positive with chest x-ray notable for multifocal pneumonia with increased inflammatory markers.   -CT abdomen/pelvis on 11/10/2019 with nodular appearance right lung base with recommendations of surveillance and follow-up noncontrast CT in 6-8 weeks.  Patient completed a 5-day course of remdesivir on 10/29/2019 and a 10-day course of Decadron on 11/03/2019. --Continue supplemental  oxygen, titrate for goal SPO2 greater than 92% --Have discontinued contact/airborne isolation precautions 11/15/2019; as this is 21 days from his positive Covid test on 10/25/2019.  Acute kidney injury on CKD stage IV sepsis with shock likely 2/2 UTI versus prostatitis Presenting with a creatinine of 3.09, which is up from his baseline of 1.67.  Renal ultrasound unremarkable with no hydronephrosis or obstruction.  On 11/10/2019, patient with fever and white blood cell count up to 47.2 with a procalcitonin of 22.18.  CT abdomen/pelvis notable for a malpositioned Foley catheter with potential cystitis, a sending urinary tract infection versus prostatitis. --Foley catheter removed on 11/15/2019 --Urine culture with no significant growth --Blood cultures x2: No growth x 5 days --Transitioned cefepime to cefdinir 300mg  BID, plan 14d total course --recheck  bmet in AM  Acute metabolic encephalopathy with intermittent hallucinations/agitation, history of schizoaffective disorder On multiple psych medsclozapine, benztropine, Thorazine, Lamictal, Lexapro at home.  Patient was confused and more somnolent during his hospital course.  Psychiatry was consulted with recommendations to discontinue Cogentin, Thorazine, Lamictal. --Seen by psychiatry in reconsultation today, 11/16/2019 --increased clozapine to 150mg  BID per Psych --started cogentin 0.5mg  BID  --Lexapro 10 mg p.o. daily --Continued on Clonazepam 0.5 mg twice daily and 1mg  qHS -Pt reports difficulty sleeping overnight. Not taking meds this AM. Sleepy durnig daytime -Repeat QTc in the 480's. Will start seroquel QHS. Have re-consulted psychiatry to assist with med adjustments  Hypernatremia Sodium trending up, 148, likely 2/2 poor p.o. intake due to somnolence and agitation. --Na 148-->143-->145-->143-->142-->141 --continue D5W to 125 cc/hour  --cont to monitor sodium levels daily  Acute renal urinary retention Patient had Foley catheter  placed on admission for acute urinary retention.  Attempt at removal on 1216 with recurrent urinary retention.  On  11/10/2019 patient with dislodgment of his catheter which was once again replaced. --Foley catheter discontinued on 11/15/2019 --Continue monitor renal function daily --Bladder scan this afternoon to ensure no significant urinary retention  Drug reaction/fungal infection Currently on lower extremities, left flank, axilla, groin area --continue nystatin topical  Hyperkalemia -Normalized -repeat bmet in AM  QTC prolongation -QTC 478 on EKG 12/20 --Keep potassium> 4, magnesium> 2 --follow BMP daily -Repeat EKG today with QTc of 161'W  Chronic diastolicCHF, compensated -2D echo in 09/2018 showed EF of 65 to 96% with LV diastolic dysfunction -Remains stable at this time  Hyperlipidemia --Continue Lipitor as tolerated  Hypothyroidism --Continue levothyroxine 100 mcg p.o. daily -currently stable  Morbid obesity  -BMI 33.9, patient advised diet and weight control  pressure injury Stage II, left buttocks, present on admission --Wound care per nursing  DVT prophylaxis: Lovenox subq Code Status: Full Family Communication: Pt in room,family not at bedside Disposition Plan: Uncertain at this time  Consultants:   Psychiatry  Procedures:     Antimicrobials: Anti-infectives (From admission, onward)   Start     Dose/Rate Route Frequency Ordered Stop   11/16/19 1000  cefdinir (OMNICEF) capsule 300 mg     300 mg Oral Every 12 hours 11/16/19 0858 11/24/19 0959   11/14/19 2200  ceFEPIme (MAXIPIME) 2 g in sodium chloride 0.9 % 100 mL IVPB  Status:  Discontinued     2 g 200 mL/hr over 30 Minutes Intravenous Every 12 hours 11/14/19 1204 11/16/19 0858   11/10/19 1700  ceFEPIme (MAXIPIME) 2 g in sodium chloride 0.9 % 100 mL IVPB  Status:  Discontinued     2 g 200 mL/hr over 30 Minutes Intravenous Daily 11/10/19 1648 11/14/19 1204   10/26/19 1000  remdesivir  100 mg in sodium chloride 0.9 % 100 mL IVPB  Status:  Discontinued     100 mg 200 mL/hr over 30 Minutes Intravenous Daily 10/25/19 2229 10/25/19 2254   10/26/19 1000  remdesivir 100 mg in sodium chloride 0.9 % 100 mL IVPB     100 mg 200 mL/hr over 30 Minutes Intravenous Daily 10/25/19 2135 10/29/19 1045   10/25/19 2230  cefTRIAXone (ROCEPHIN) 1 g in sodium chloride 0.9 % 100 mL IVPB  Status:  Discontinued     1 g 200 mL/hr over 30 Minutes Intravenous Every 24 hours 10/25/19 2228 10/30/19 1746   10/25/19 2230  doxycycline (VIBRA-TABS) tablet 100 mg  Status:  Discontinued     100 mg Oral Every 12 hours 10/25/19 2229 10/30/19 1746   10/25/19 2228  remdesivir 200 mg in sodium chloride 0.9% 250 mL IVPB  Status:  Discontinued     200 mg 580 mL/hr over 30 Minutes Intravenous Once 10/25/19 2229 10/25/19 2254   10/25/19 2200  remdesivir 200 mg in sodium chloride 0.9% 250 mL IVPB     200 mg 580 mL/hr over 30 Minutes Intravenous Once 10/25/19 2135 10/26/19 0256      Subjective: Feels thirsty and asking for a Coke  Objective: Vitals:   11/18/19 2152 11/19/19 0559 11/19/19 0757 11/19/19 1504  BP: (!) 131/58 112/60  128/75  Pulse: 94 98  91  Resp: 18 15  15   Temp: 97.9 F (36.6 C) 97.9 F (36.6 C)  97.8 F (36.6 C)  TempSrc: Oral Oral  Oral  SpO2: 100% 99% 96% 99%  Weight:      Height:        Intake/Output Summary (Last 24 hours) at 11/19/2019 1516 Last data filed  at 11/19/2019 1333 Gross per 24 hour  Intake 549.58 ml  Output 2000 ml  Net -1450.42 ml   Filed Weights   11/08/19 1324 11/15/19 0700 11/16/19 1100  Weight: 113 kg 113.3 kg 111.9 kg    Examination: General exam: Awake, laying in bed, in nad Respiratory system: Normal respiratory effort, no wheezing Cardiovascular system: regular rate, s1, s2 Gastrointestinal system: Soft, nondistended, positive BS Central nervous system: CN2-12 grossly intact, strength intact Extremities: Perfused, no clubbing Skin: Normal skin  turgor, no notable skin lesions seen Psychiatry: Mood normal // no visual hallucinations   Data Reviewed: I have personally reviewed following labs and imaging studies  CBC: Recent Labs  Lab 11/14/19 0218 11/15/19 0337 11/16/19 0335 11/18/19 0524 11/19/19 0330  WBC 11.4* 9.7 8.1 9.8 10.6*  NEUTROABS 9.3*  --   --   --   --   HGB 10.6* 10.2* 10.5* 10.3* 10.4*  HCT 33.6* 32.2* 33.3* 32.1* 32.8*  MCV 98.8 97.6 96.8 97.3 98.8  PLT 156 123* 130* 172 707   Basic Metabolic Panel: Recent Labs  Lab 11/15/19 0337 11/16/19 0335 11/17/19 0306 11/18/19 0524 11/19/19 0330  NA 142 141 141 141 141  K 3.8 3.9 3.8 4.1 3.7  CL 107 105 106 103 104  CO2 27 26 27 29 30   GLUCOSE 143* 134* 149* 173* 156*  BUN 19 15 12 17 18   CREATININE 1.47* 1.52* 1.41* 1.47* 1.63*  CALCIUM 9.6 9.8 9.6 9.8 9.6   GFR: Estimated Creatinine Clearance: 57.6 mL/min (A) (by C-G formula based on SCr of 1.63 mg/dL (H)). Liver Function Tests: Recent Labs  Lab 11/13/19 0239  AST 40  ALT 33  ALKPHOS 88  BILITOT 1.2  PROT 5.6*  ALBUMIN 2.5*   No results for input(s): LIPASE, AMYLASE in the last 168 hours. No results for input(s): AMMONIA in the last 168 hours. Coagulation Profile: No results for input(s): INR, PROTIME in the last 168 hours. Cardiac Enzymes: No results for input(s): CKTOTAL, CKMB, CKMBINDEX, TROPONINI in the last 168 hours. BNP (last 3 results) No results for input(s): PROBNP in the last 8760 hours. HbA1C: No results for input(s): HGBA1C in the last 72 hours. CBG: Recent Labs  Lab 11/13/19 1714  GLUCAP 121*   Lipid Profile: No results for input(s): CHOL, HDL, LDLCALC, TRIG, CHOLHDL, LDLDIRECT in the last 72 hours. Thyroid Function Tests: No results for input(s): TSH, T4TOTAL, FREET4, T3FREE, THYROIDAB in the last 72 hours. Anemia Panel: No results for input(s): VITAMINB12, FOLATE, FERRITIN, TIBC, IRON, RETICCTPCT in the last 72 hours. Sepsis Labs: Recent Labs  Lab 11/13/19 0239  11/14/19 0421  PROCALCITON 3.67 1.70    Recent Results (from the past 240 hour(s))  Culture, blood (routine x 2)     Status: None   Collection Time: 11/10/19  5:30 PM   Specimen: BLOOD RIGHT HAND  Result Value Ref Range Status   Specimen Description BLOOD RIGHT HAND  Final   Special Requests   Final    BOTTLES DRAWN AEROBIC ONLY Blood Culture adequate volume   Culture   Final    NO GROWTH 5 DAYS Performed at [facility], [address] [place], [place] [address]    Report Status 11/15/2019 FINAL  Final  Culture, blood (routine x 2)     Status: None   Collection Time: 11/10/19  5:30 PM   Specimen: BLOOD RIGHT ARM  Result Value Ref Range Status   Specimen Description   Final    BLOOD  RIGHT ARM Performed at [facility], [place] [address] [place], [place] [address]    Special Requests   Final    BOTTLES DRAWN AEROBIC AND ANAEROBIC Blood Culture adequate volume Performed at [facility] [address], [place], [place] [address]    Culture   Final    NO GROWTH 5 DAYS Performed at [facility], [place] [address], [place], [place] [address]    Report Status 11/15/2019 FINAL  Final  MRSA PCR Screening     Status: None   Collection Time: 11/10/19  9:09 PM   Specimen: Nasal Mucosa; Nasopharyngeal  Result Value Ref Range Status   MRSA by PCR NEGATIVE NEGATIVE Final    Comment:        The GeneXpert MRSA Assay (FDA approved for NASAL specimens only), is one component of a comprehensive MRSA colonization surveillance program. It is not intended to diagnose MRSA infection nor to guide or monitor treatment for MRSA infections. Performed at [facility], [place] [address] [place], [place] [address]   Urine culture     Status: Abnormal   Collection Time: 11/11/19  2:44 AM   Specimen: Urine, Random  Result Value Ref Range Status   Specimen Description   Final    URINE, RANDOM Performed at [facility], [address] [place], [place] [address]    Special Requests   Final    NONE Performed at [facility], [place] [address], [place], [place] [address]    Culture (A)  Final    <10,000 COLONIES/mL INSIGNIFICANT GROWTH Performed at [facility] [address], [place], [place] [address]    Report Status 11/12/2019 FINAL  Final     Radiology Studies: No results found.  Scheduled Meds: . aspirin EC  81 mg Oral Daily  . atorvastatin  20 mg Oral q1800  . benztropine  0.5 mg Oral BID  . cefdinir  300 mg Oral Q12H  . cholecalciferol  5,000 Units Oral Daily  . clonazePAM  1 mg Oral QHS  . clotrimazole   Topical BID  . cloZAPine  150 mg Oral BID  . docusate sodium  100 mg Oral Daily  . enoxaparin (LOVENOX) injection  40 mg Subcutaneous QHS  . escitalopram  10 mg Oral QAC breakfast  . feeding supplement (ENSURE ENLIVE)  237 mL Oral BID BM  . fluticasone  1 puff Inhalation BID  . folic acid  1 mg Oral Daily  . Ipratropium-Albuterol  1 puff Inhalation BID  . levothyroxine  100 mcg Oral Q0600  . mouth rinse  15 mL Mouth Rinse BID  . multivitamin with minerals  1 tablet Oral Daily  . nystatin  1 Bottle Topical Daily  . polyethylene glycol  17 g Oral Daily  . QUEtiapine  25 mg Oral QHS  . senna  1 tablet Oral Daily  . sodium chloride flush  10-40 mL Intracatheter Q12H  . thiamine  100 mg Oral Daily  . vitamin C  500 mg Oral Daily  . zinc sulfate  220 mg Oral Daily   Continuous Infusions: . dextrose 125 mL/hr at 11/18/19 2247     LOS: 25 days   Marylu Lund, MD [facility] Pager On Amion  If 7PM-7AM, please contact night-coverage 11/19/2019, 3:16 PM

## 2019-11-20 LAB — COMPREHENSIVE METABOLIC PANEL
ALT: 20 U/L (ref 0–44)
AST: 16 U/L (ref 15–41)
Albumin: 3 g/dL — ABNORMAL LOW (ref 3.5–5.0)
Alkaline Phosphatase: 69 U/L (ref 38–126)
Anion gap: 9 (ref 5–15)
BUN: 14 mg/dL (ref 8–23)
CO2: 29 mmol/L (ref 22–32)
Calcium: 9.7 mg/dL (ref 8.9–10.3)
Chloride: 103 mmol/L (ref 98–111)
Creatinine, Ser: 1.66 mg/dL — ABNORMAL HIGH (ref 0.61–1.24)
GFR calc Af Amer: 49 mL/min — ABNORMAL LOW (ref >60)
GFR calc non Af Amer: 42 mL/min — ABNORMAL LOW (ref >60)
Glucose, Bld: 155 mg/dL — ABNORMAL HIGH (ref 70–99)
Potassium: 3.6 mmol/L (ref 3.5–5.1)
Sodium: 141 mmol/L (ref 135–145)
Total Bilirubin: 0.5 mg/dL (ref 0.3–1.2)
Total Protein: 5.9 g/dL — ABNORMAL LOW (ref 6.5–8.1)

## 2019-11-20 LAB — CBC
HCT: 34.4 % — ABNORMAL LOW (ref 39.0–52.0)
Hemoglobin: 10.9 g/dL — ABNORMAL LOW (ref 13.0–17.0)
MCH: 31.1 pg (ref 26.0–34.0)
MCHC: 31.7 g/dL (ref 30.0–36.0)
MCV: 98.3 fL (ref 80.0–100.0)
Platelets: 195 10*3/uL (ref 150–400)
RBC: 3.5 MIL/uL — ABNORMAL LOW (ref 4.22–5.81)
RDW: 14.6 % (ref 11.5–15.5)
WBC: 10.6 10*3/uL — ABNORMAL HIGH (ref 4.0–10.5)
nRBC: 0 % (ref 0.0–0.2)

## 2019-11-20 LAB — MAGNESIUM: Magnesium: 1.9 mg/dL (ref 1.7–2.4)

## 2019-11-20 MED ORDER — QUETIAPINE FUMARATE 25 MG PO TABS
12.5000 mg | ORAL_TABLET | Freq: Two times a day (BID) | ORAL | Status: DC
  Administered 2019-11-20 – 2019-11-28 (×17): 12.5 mg via ORAL
  Filled 2019-11-20 (×17): qty 1

## 2019-11-20 NOTE — NC FL2 (Signed)
[place] LEVEL OF CARE SCREENING TOOL     IDENTIFICATION  Patient Name: [name] Birthdate: 12/08/1950 Sex: male Admission Date (Current Location): 10/25/2019  [facility] and [place] Number:  Herbalist and Address:  [facility]  [place], [place]      Provider Number: [id number]  Attending Physician Name and Address:  Donne Hazel, MD  Relative Name and Phone Number:  Jari Favre Legal Guardian   [phone] or [place] [phone]    Current Level of Care: Hospital Recommended Level of Care: [place] Prior Approval Number:    Date Approved/Denied:   PASRR Number: [id number] E expires 12/03/19  Discharge Plan: SNF    Current Diagnoses: Patient Active Problem List   Diagnosis Date Noted  . Obstructive uropathy 11/13/2019  . Prostatitis 11/13/2019  . Pneumonia due to severe acute respiratory syndrome coronavirus 2 (SARS-CoV-2) 10/25/2019  . Small bowel obstruction ([place]) 02/19/2018  . Cellulitis 12/02/2017  . Severe left ventricular hypertrophy 10/21/2017  . Stage II pressure injury of buttocks 10/03/2017  . Obesity (BMI 30-39.9) 10/03/2017  . Hyperlipidemia 10/03/2017  . Hypothyroidism 10/03/2017  . RBBB 09/17/2017  . Lobar pneumonia ([place]) 02/12/2016  . Cellulitis of right lower extremity 03/28/2015  . Sepsis secondary to UTI ([place]) 03/27/2015  . Prolonged QT interval   . CLL (chronic lymphocytic leukemia) ([place]) 09/12/2013  . Non Hodgkin's lymphoma ([place]) 09/12/2013  . Leukocytosis 12/24/2012  . Anemia 12/24/2012  . CKD (chronic kidney disease), stage III 12/24/2012  . Schizophrenia ([place]) 12/24/2012    Orientation RESPIRATION BLADDER Height & Weight     Self  Normal Incontinent Weight: 246 lb 11.1 oz (111.9 kg) Height:  6' (182.9 cm)  BEHAVIORAL SYMPTOMS/MOOD NEUROLOGICAL BOWEL NUTRITION STATUS      Incontinent Diet(Carb modified)  AMBULATORY STATUS COMMUNICATION  OF NEEDS Skin   Limited Assist Verbally PU Stage and Appropriate Care PU Stage 1 Dressing: (PRN dressing changes)                     Personal Care Assistance Level of Assistance  Bathing, Feeding, Dressing Bathing Assistance: Limited assistance Feeding assistance: Limited assistance Dressing Assistance: Limited assistance     Functional Limitations Info  Sight, Hearing, Speech Sight Info: Adequate Hearing Info: Adequate Speech Info: Adequate    SPECIAL CARE FACTORS FREQUENCY  PT (By licensed PT), OT (By licensed OT)     PT Frequency: Minimum 5x a week OT Frequency: Minimum 5x a week            Contractures Contractures Info: Not present    Additional Factors Info  Code Status, Allergies, Psychotropic Code Status Info: Full Code Allergies Info: Sulfamethoxazole Psychotropic Info: escitalopram (LEXAPRO) tablet 10 mg and QUEtiapine (SEROQUEL) tablet 12.5 mg and QUEtiapine (SEROQUEL) tablet 25 mg         Current Medications (11/20/2019):  This is the current hospital active medication list Current Facility-Administered Medications  Medication Dose Route Frequency Provider Last Rate Last Admin  . acetaminophen (TYLENOL) tablet 650 mg  650 mg Oral Q6H PRN Rai, Ripudeep K, MD   650 mg at 11/17/19 1700  . aspirin EC tablet 81 mg  81 mg Oral Daily Rai, Ripudeep K, MD   81 mg at 11/20/19 0955  . atorvastatin (LIPITOR) tablet 20 mg  20 mg Oral q1800 Rai, Ripudeep K, MD   20 mg at 11/19/19 2138  . benztropine (COGENTIN) tablet 0.5 mg  0.5 mg  Oral BID [place] Einstein J, DO   0.5 mg at 11/20/19 6578  . cefdinir (OMNICEF) capsule 300 mg  300 mg Oral Q12H [place] Donnamarie Poag, DO   300 mg at 11/20/19 4696  . cholecalciferol (VITAMIN D3) tablet 5,000 Units  5,000 Units Oral Daily Rai, Ripudeep K, MD   5,000 Units at 11/19/19 1527  . clonazePAM (KLONOPIN) tablet 1 mg  1 mg Oral QHS [place] Donnamarie Poag, DO   1 mg at 11/19/19 2137  . clotrimazole (LOTRIMIN) 1 % cream   Topical BID Rai, Vernelle Emerald, MD   Given at 11/20/19 1021  . cloZAPine (CLOZARIL) tablet 150 mg  150 mg Oral BID [place] Elisabel Hanover J, DO   150 mg at 11/20/19 0955  . dextrose 5 % solution   Intravenous Continuous [place] Sheniqua Carolan J, [place] 125 mL/hr at 11/20/19 2952 New Bag at 11/20/19 8413  . docusate sodium (COLACE) capsule 100 mg  100 mg Oral Daily Rai, Ripudeep K, MD   100 mg at 11/20/19 0955  . enoxaparin (LOVENOX) injection 40 mg  40 mg Subcutaneous QHS [place] Blessings Inglett J, DO   40 mg at 11/19/19 2138  . escitalopram (LEXAPRO) tablet 10 mg  10 mg Oral QAC breakfast Rai, Ripudeep K, MD   10 mg at 11/20/19 0954  . feeding supplement (ENSURE ENLIVE) (ENSURE ENLIVE) liquid 237 mL  237 mL Oral BID BM Rai, Ripudeep K, MD   237 mL at 11/19/19 2102  . fluticasone (FLOVENT HFA) 220 MCG/ACT inhaler 1 puff  1 puff Inhalation BID Rai, Ripudeep K, MD   1 puff at 24/40/10 2725  . folic acid (FOLVITE) tablet 1 mg  1 mg Oral Daily Rai, Ripudeep K, MD   1 mg at 11/20/19 0954  . Ipratropium-Albuterol (COMBIVENT) respimat 1 puff  1 puff Inhalation Q6H PRN Rai, Ripudeep K, MD      . Ipratropium-Albuterol (COMBIVENT) respimat 1 puff  1 puff Inhalation BID [place] Winferd Wease J, DO   1 puff at 11/20/19 0929  . levothyroxine (SYNTHROID) tablet 100 mcg  100 mcg Oral Q0600 [place] Symphony Demuro J, DO   100 mcg at 11/19/19 3664  . LORazepam (ATIVAN) injection 0.5 mg  0.5 mg Intravenous Q12H PRN [place] Yandriel Boening J, DO   0.5 mg at 11/20/19 1136  . MEDLINE mouth rinse  15 mL Mouth Rinse BID [place] Domingue Coltrain J, DO   15 mL at 11/18/19 2247  . multivitamin with minerals tablet 1 tablet  1 tablet Oral Daily Rai, Ripudeep K, MD   1 tablet at 11/20/19 0954  . nystatin (MYCOSTATIN/NYSTOP) topical powder 1 Bottle  1 Bottle Topical Daily Rai, Ripudeep K, MD   1 Bottle at 11/20/19 1002  . polyethylene glycol (MIRALAX / GLYCOLAX) packet 17 g  17 g Oral Daily Rai, Ripudeep K, MD   17 g at 11/18/19 1020  . QUEtiapine (SEROQUEL) tablet 12.5 mg  12.5 mg Oral BID Patrecia Pour, NP   12.5 mg at  11/20/19 1307  . QUEtiapine (SEROQUEL) tablet 25 mg  25 mg Oral QHS Donne Hazel, MD   25 mg at 11/19/19 2137  . senna (SENOKOT) tablet 8.6 mg  1 tablet Oral Daily Rai, Ripudeep K, MD   8.6 mg at 11/20/19 0954  . sodium chloride (OCEAN) 0.65 % nasal spray 1 spray  1 spray Each Nare PRN Donne Hazel, MD   1 spray at 11/19/19 1035  . sodium chloride flush (NS) 0.9 % injection 10-40 mL  10-40 mL  Intracatheter Q12H [place] Darien Mignogna J, DO   10 mL at 11/18/19 1023  . sodium chloride flush (NS) 0.9 % injection 10-40 mL  10-40 mL Intracatheter PRN [place] Amrom Ore J, DO      . thiamine (VITAMIN B-1) tablet 100 mg  100 mg Oral Daily Rai, Ripudeep K, MD   100 mg at 11/20/19 0955  . vitamin C (ASCORBIC ACID) tablet 500 mg  500 mg Oral Daily Rai, Ripudeep K, MD   500 mg at 11/20/19 0954  . zinc sulfate capsule 220 mg  220 mg Oral Daily Rai, Ripudeep K, MD   220 mg at 11/20/19 0940     Discharge Medications: Please see discharge summary for a list of discharge medications.  Relevant Imaging Results:  Relevant Lab Results:   Additional Information SSN [id number]  Ross Ludwig, LCSW

## 2019-11-20 NOTE — Progress Notes (Signed)
PROGRESS NOTE    [name]  YOV:[id number] DOB: 04/26/1951 DOA: 10/25/2019 PCP: [name], FNP    Brief Narrative:  69 year old male, obese, with PMH significant for paranoid schizophrenia, prolonged QT interval, non-Hodgkin's lymphoma, hypertension, hyperlipidemia, COPD, diastolic congestive heart failure and chronic kidney disease. Patient lives in a group home and on12/9 patient was sent to the ED for worsening shortness of breath, fever chills and low O2 sat at 89%.   In the ED, patient was started on O2 by nasal cannula. Work-up showed elevated creatinine to 3.09 (up from 1.67). COVID-19 PCR was positive. Chest x-ray revealed multifocal pneumonia.  Patient was admitted to hospital service for further evaluation and management.  Was initially hospitalized for Covid pneumonia for which he completed the treatment. His hospital course was complicated by AKI, acute urinary retention.  Patient completed a course of Decadron, IV remdesivir.  He continues to be encephalopathic with intermittent agitation and drowsiness, currently awaiting SNF placement.  Assessment & Plan:   Principal Problem:   Schizophrenia ([place]) Active Problems:   Sepsis secondary to UTI ([place])   Pneumonia due to severe acute respiratory syndrome coronavirus 2 (SARS-CoV-2)   Obstructive uropathy   Prostatitis   Acute Hypoxic Respiratory Failure due to Acute Covid 19 Viral Pneumonia during the ongoing 2020 Covid 19 Pandemic - POA -Patient presenting with hypoxia on presentation with oxygen saturations 89%.  Covid-19 PCR positive with chest x-ray notable for multifocal pneumonia with increased inflammatory markers.   -CT abdomen/pelvis on 11/10/2019 with nodular appearance right lung base with recommendations of surveillance and follow-up noncontrast CT in 6-8 weeks.  Patient completed a 5-day course of remdesivir on 10/29/2019 and a 10-day course of Decadron on 11/03/2019. --Continue supplemental  oxygen, titrate for goal SPO2 greater than 92% --Have discontinued contact/airborne isolation precautions 11/15/2019; as this is over 21 days from his positive Covid test on 10/25/2019.  Acute kidney injury on CKD stage IV sepsis with shock likely 2/2 UTI versus prostatitis Presenting with a creatinine of 3.09, which is up from his baseline of 1.67.  Renal ultrasound unremarkable with no hydronephrosis or obstruction.  On 11/10/2019, patient with fever and white blood cell count up to 47.2 with a procalcitonin of 22.18.  CT abdomen/pelvis notable for a malpositioned Foley catheter with potential cystitis, a sending urinary tract infection versus prostatitis. --Foley catheter removed on 11/15/2019 --Urine culture with no significant growth --Blood cultures x2: No growth thus far --Transitioned cefepime to cefdinir 300mg  BID, plan 14d total course --cont to follow renal function  Acute metabolic encephalopathy with intermittent hallucinations/agitation, history of schizoaffective disorder On multiple psych medsclozapine, benztropine, Thorazine, Lamictal, Lexapro at home.  Patient was confused and more somnolent during his hospital course.  Psychiatry was consulted with recommendations to discontinue Cogentin, Thorazine, Lamictal. --Seen by psychiatry in reconsultation today, 11/16/2019 --Recently increased clozapine to 150mg  BID per Psych --On cogentin 0.5mg  BID  --Lexapro 10 mg p.o. daily --Continued on Clonazepam 0.5 mg twice daily and 1mg  qHS -Patient with periods of agitation. Had started 25mg  seroquel QHS and consulted Psychiatry for further recommendations. Plan to add seroquel 12.5mg  at 8am and 2pm in addition to QHS dosing  Hypernatremia Sodium trending up, 148, likely 2/2 poor p.o. intake due to somnolence and agitation. --continue D5W to 125 cc/hour  --sodium levels have remained stable  Acute renal urinary retention Patient had Foley catheter placed on admission for acute  urinary retention.  Attempt at removal on 1216 with recurrent urinary retention.  On 11/10/2019 patient  with dislodgment of his catheter which was once again replaced. --Foley catheter discontinued on 11/15/2019 --Cont to follow renal function  Drug reaction/fungal infection Currently on lower extremities, left flank, axilla, groin area --continue nystatin topical  Hyperkalemia -Normalized -would continue to follow renal panel  QTC prolongation -QTC 478 on EKG 12/20 --Keep potassium> 4, magnesium> 2 --follow BMP daily -Repeat EKG 1/3 with QTc of 144'R  Chronic diastolicCHF, compensated -2D echo in 09/2018 showed EF of 65 to 15% with LV diastolic dysfunction -Presently stable  Hyperlipidemia --Continue Lipitor as tolerated  Hypothyroidism --Continue levothyroxine 100 mcg p.o. daily - presently stable  Morbid obesity  -BMI 33.9, patient advised diet and weight control  pressure injury Stage II, left buttocks, present on admission --Wound care per nursing  DVT prophylaxis: Lovenox subq Code Status: Full Family Communication: Pt in room,family not at bedside Disposition Plan: Uncertain at this time  Consultants:   Psychiatry  Procedures:     Antimicrobials: Anti-infectives (From admission, onward)   Start     Dose/Rate Route Frequency Ordered Stop   11/16/19 1000  cefdinir (OMNICEF) capsule 300 mg     300 mg Oral Every 12 hours 11/16/19 0858 11/24/19 0959   11/14/19 2200  ceFEPIme (MAXIPIME) 2 g in sodium chloride 0.9 % 100 mL IVPB  Status:  Discontinued     2 g 200 mL/hr over 30 Minutes Intravenous Every 12 hours 11/14/19 1204 11/16/19 0858   11/10/19 1700  ceFEPIme (MAXIPIME) 2 g in sodium chloride 0.9 % 100 mL IVPB  Status:  Discontinued     2 g 200 mL/hr over 30 Minutes Intravenous Daily 11/10/19 1648 11/14/19 1204   10/26/19 1000  remdesivir 100 mg in sodium chloride 0.9 % 100 mL IVPB  Status:  Discontinued     100 mg 200 mL/hr over 30 Minutes  Intravenous Daily 10/25/19 2229 10/25/19 2254   10/26/19 1000  remdesivir 100 mg in sodium chloride 0.9 % 100 mL IVPB     100 mg 200 mL/hr over 30 Minutes Intravenous Daily 10/25/19 2135 10/29/19 1045   10/25/19 2230  cefTRIAXone (ROCEPHIN) 1 g in sodium chloride 0.9 % 100 mL IVPB  Status:  Discontinued     1 g 200 mL/hr over 30 Minutes Intravenous Every 24 hours 10/25/19 2228 10/30/19 1746   10/25/19 2230  doxycycline (VIBRA-TABS) tablet 100 mg  Status:  Discontinued     100 mg Oral Every 12 hours 10/25/19 2229 10/30/19 1746   10/25/19 2228  remdesivir 200 mg in sodium chloride 0.9% 250 mL IVPB  Status:  Discontinued     200 mg 580 mL/hr over 30 Minutes Intravenous Once 10/25/19 2229 10/25/19 2254   10/25/19 2200  remdesivir 200 mg in sodium chloride 0.9% 250 mL IVPB     200 mg 580 mL/hr over 30 Minutes Intravenous Once 10/25/19 2135 10/26/19 0256      Subjective: States "I feel I'm the devil"  Objective: Vitals:   11/19/19 2058 11/20/19 0521 11/20/19 0929 11/20/19 0932  BP: 114/78 116/75    Pulse: 89 80    Resp: 16 18    Temp: 97.9 F (36.6 C) 97.7 F (36.5 C)    TempSrc: Oral Oral    SpO2: 100% 95% 93% 93%  Weight:      Height:        Intake/Output Summary (Last 24 hours) at 11/20/2019 1347 Last data filed at 11/20/2019 1250 Gross per 24 hour  Intake 3300.75 ml  Output 2251 ml  Net  1049.75 ml   Filed Weights   11/08/19 1324 11/15/19 0700 11/16/19 1100  Weight: 113 kg 113.3 kg 111.9 kg    Examination: General exam: Conversant, in no acute distress Respiratory system: normal chest rise, clear, no audible wheezing Cardiovascular system: regular rhythm, s1-s2 Gastrointestinal system: Nondistended, nontender, pos BS Central nervous system: No seizures, no tremors Extremities: No cyanosis, no joint deformities Skin: No rashes, no pallor Psychiatry: difficult to assess given current mentation   Data Reviewed: I have personally reviewed following labs and imaging  studies  CBC: Recent Labs  Lab 11/14/19 0218 11/15/19 0337 11/16/19 0335 11/18/19 0524 11/19/19 0330 11/20/19 0343  WBC 11.4* 9.7 8.1 9.8 10.6* 10.6*  NEUTROABS 9.3*  --   --   --   --   --   HGB 10.6* 10.2* 10.5* 10.3* 10.4* 10.9*  HCT 33.6* 32.2* 33.3* 32.1* 32.8* 34.4*  MCV 98.8 97.6 96.8 97.3 98.8 98.3  PLT 156 123* 130* 172 187 893   Basic Metabolic Panel: Recent Labs  Lab 11/16/19 0335 11/17/19 0306 11/18/19 0524 11/19/19 0330 11/20/19 0343  NA 141 141 141 141 141  K 3.9 3.8 4.1 3.7 3.6  CL 105 106 103 104 103  CO2 26 27 29 30 29   GLUCOSE 134* 149* 173* 156* 155*  BUN 15 12 17 18 14   CREATININE 1.52* 1.41* 1.47* 1.63* 1.66*  CALCIUM 9.8 9.6 9.8 9.6 9.7  MG  --   --   --   --  1.9   GFR: Estimated Creatinine Clearance: 56.5 mL/min (A) (by C-G formula based on SCr of 1.66 mg/dL (H)). Liver Function Tests: Recent Labs  Lab 11/20/19 0343  AST 16  ALT 20  ALKPHOS 69  BILITOT 0.5  PROT 5.9*  ALBUMIN 3.0*   No results for input(s): LIPASE, AMYLASE in the last 168 hours. No results for input(s): AMMONIA in the last 168 hours. Coagulation Profile: No results for input(s): INR, PROTIME in the last 168 hours. Cardiac Enzymes: No results for input(s): CKTOTAL, CKMB, CKMBINDEX, TROPONINI in the last 168 hours. BNP (last 3 results) No results for input(s): PROBNP in the last 8760 hours. HbA1C: No results for input(s): HGBA1C in the last 72 hours. CBG: Recent Labs  Lab 11/13/19 1714  GLUCAP 121*   Lipid Profile: No results for input(s): CHOL, HDL, LDLCALC, TRIG, CHOLHDL, LDLDIRECT in the last 72 hours. Thyroid Function Tests: No results for input(s): TSH, T4TOTAL, FREET4, T3FREE, THYROIDAB in the last 72 hours. Anemia Panel: No results for input(s): VITAMINB12, FOLATE, FERRITIN, TIBC, IRON, RETICCTPCT in the last 72 hours. Sepsis Labs: Recent Labs  Lab 11/14/19 0421  PROCALCITON 1.70    Recent Results (from the past 240 hour(s))  Culture, blood  (routine x 2)     Status: None   Collection Time: 11/10/19  5:30 PM   Specimen: BLOOD RIGHT HAND  Result Value Ref Range Status   Specimen Description BLOOD RIGHT HAND  Final   Special Requests   Final    BOTTLES DRAWN AEROBIC ONLY Blood Culture adequate volume   Culture   Final    NO GROWTH 5 DAYS Performed at [facility], [address] [place], [place] [address]    Report Status 11/15/2019 FINAL  Final  Culture, blood (routine x 2)     Status: None   Collection Time: 11/10/19  5:30 PM   Specimen: BLOOD RIGHT ARM  Result Value Ref Range Status   Specimen Description   Final  BLOOD RIGHT ARM Performed at [facility], [place] [address], [place], [place] [address]    Special Requests   Final    BOTTLES DRAWN AEROBIC AND ANAEROBIC Blood Culture adequate volume Performed at [place] [address] [place], [place] [address]    Culture   Final    NO GROWTH 5 DAYS Performed at [facility], [place] [address] [place], [place] [address]    Report Status 11/15/2019 FINAL  Final  MRSA PCR Screening     Status: None   Collection Time: 11/10/19  9:09 PM   Specimen: Nasal Mucosa; Nasopharyngeal  Result Value Ref Range Status   MRSA by PCR NEGATIVE NEGATIVE Final    Comment:        The GeneXpert MRSA Assay (FDA approved for NASAL specimens only), is one component of a comprehensive MRSA colonization surveillance program. It is not intended to diagnose MRSA infection nor to guide or monitor treatment for MRSA infections. Performed at [facility], [place] [address] [place], [place] [address]   Urine culture     Status: Abnormal   Collection Time: 11/11/19  2:44 AM   Specimen: Urine, Random  Result Value Ref Range Status   Specimen Description   Final    URINE, RANDOM Performed at [facility], [address] [place], [place] [address]    Special Requests   Final    NONE Performed at [facility], [place] [address], [place], [place] [address]    Culture (A)  Final    <10,000 COLONIES/mL INSIGNIFICANT GROWTH Performed at [place] [address] [place], [place] [address]    Report Status 11/12/2019 FINAL  Final     Radiology Studies: No results found.  Scheduled Meds: . aspirin EC  81 mg Oral Daily  . atorvastatin  20 mg Oral q1800  . benztropine  0.5 mg Oral BID  . cefdinir  300 mg Oral Q12H  . cholecalciferol  5,000 Units Oral Daily  . clonazePAM  1 mg Oral QHS  . clotrimazole   Topical BID  . cloZAPine  150 mg Oral BID  . docusate sodium  100 mg Oral Daily  . enoxaparin (LOVENOX) injection  40 mg Subcutaneous QHS  . escitalopram  10 mg Oral QAC breakfast  . feeding supplement (ENSURE ENLIVE)  237 mL Oral BID BM  . fluticasone  1 puff Inhalation BID  . folic acid  1 mg Oral Daily  . Ipratropium-Albuterol  1 puff Inhalation BID  . levothyroxine  100 mcg Oral Q0600  . mouth rinse  15 mL Mouth Rinse BID  . multivitamin with minerals  1 tablet Oral Daily  . nystatin  1 Bottle Topical Daily  . polyethylene glycol  17 g Oral Daily  . QUEtiapine  12.5 mg Oral BID  . QUEtiapine  25 mg Oral QHS  . senna  1 tablet Oral Daily  . sodium chloride flush  10-40 mL Intracatheter Q12H  . thiamine  100 mg Oral Daily  . vitamin C  500 mg Oral Daily  . zinc sulfate  220 mg Oral Daily   Continuous Infusions: . dextrose 125 mL/hr at 11/20/19 3244     LOS: 26 days   Marylu Lund, MD [facility] Pager On Amion  If 7PM-7AM, please contact night-coverage 11/20/2019, 1:47 PM

## 2019-11-20 NOTE — Progress Notes (Signed)
Medication recommendations for agitation:  Seroquel 12.5 mg at 8 am and 2 pm along with his 25 mg at bedtime.  This can be increased, if needed.  Waylan Boga, PMHNP

## 2019-11-20 NOTE — Progress Notes (Signed)
Pt has poor effort at doing inhalers.   

## 2019-11-20 NOTE — TOC Progression Note (Signed)
Transition of Care [facility]) - Progression Note    Patient Details  Name: [name] MRN: [id number] Date of Birth: Aug 29, 1951  Transition of Care [facility] CM/SW Contact  Ross Ludwig, [place] Phone Number: 11/20/2019, 3:52 PM  Clinical Narrative:    CSW continuing to look for SNF placement for patient, updated FL2 sent out to different SNFs.  Waiting for options for SNF placement.    Expected Discharge Plan and Services    skilled nursing home.                                             Social Determinants of Health (SDOH) Interventions    Readmission Risk Interventions Readmission Risk Prevention Plan 07/20/2018  Transportation Screening Complete  PCP or Specialist Appt within 3-5 Days Complete  Home Care Screening Complete  HRI or Home Care Consult Complete  Social Work Consult for [place] Planning/Counseling Complete  Palliative Care Screening Complete  Medication Review [employer]) Complete  Some recent data might be hidden

## 2019-11-21 LAB — COMPREHENSIVE METABOLIC PANEL
ALT: 20 U/L (ref 0–44)
AST: 20 U/L (ref 15–41)
Albumin: 2.9 g/dL — ABNORMAL LOW (ref 3.5–5.0)
Alkaline Phosphatase: 75 U/L (ref 38–126)
Anion gap: 8 (ref 5–15)
BUN: 15 mg/dL (ref 8–23)
CO2: 28 mmol/L (ref 22–32)
Calcium: 9.4 mg/dL (ref 8.9–10.3)
Chloride: 102 mmol/L (ref 98–111)
Creatinine, Ser: 1.75 mg/dL — ABNORMAL HIGH (ref 0.61–1.24)
GFR calc Af Amer: 46 mL/min — ABNORMAL LOW (ref >60)
GFR calc non Af Amer: 40 mL/min — ABNORMAL LOW (ref >60)
Glucose, Bld: 157 mg/dL — ABNORMAL HIGH (ref 70–99)
Potassium: 3.7 mmol/L (ref 3.5–5.1)
Sodium: 138 mmol/L (ref 135–145)
Total Bilirubin: 0.4 mg/dL (ref 0.3–1.2)
Total Protein: 5.6 g/dL — ABNORMAL LOW (ref 6.5–8.1)

## 2019-11-21 LAB — CBC WITH DIFFERENTIAL/PLATELET
Abs Immature Granulocytes: 0.17 10*3/uL — ABNORMAL HIGH (ref 0.00–0.07)
Basophils Absolute: 0.1 10*3/uL (ref 0.0–0.1)
Basophils Relative: 0 %
Eosinophils Absolute: 0.3 10*3/uL (ref 0.0–0.5)
Eosinophils Relative: 2 %
HCT: 33.7 % — ABNORMAL LOW (ref 39.0–52.0)
Hemoglobin: 10.7 g/dL — ABNORMAL LOW (ref 13.0–17.0)
Immature Granulocytes: 1 %
Lymphocytes Relative: 16 %
Lymphs Abs: 2.5 10*3/uL (ref 0.7–4.0)
MCH: 30.9 pg (ref 26.0–34.0)
MCHC: 31.8 g/dL (ref 30.0–36.0)
MCV: 97.4 fL (ref 80.0–100.0)
Monocytes Absolute: 0.7 10*3/uL (ref 0.1–1.0)
Monocytes Relative: 4 %
Neutro Abs: 12.1 10*3/uL — ABNORMAL HIGH (ref 1.7–7.7)
Neutrophils Relative %: 77 %
Platelets: 210 10*3/uL (ref 150–400)
RBC: 3.46 MIL/uL — ABNORMAL LOW (ref 4.22–5.81)
RDW: 14.8 % (ref 11.5–15.5)
WBC: 15.9 10*3/uL — ABNORMAL HIGH (ref 4.0–10.5)
nRBC: 0 % (ref 0.0–0.2)

## 2019-11-21 NOTE — Progress Notes (Addendum)
PROGRESS NOTE    [name]  HYI:[id number] DOB: Dec 10, 1950 DOA: 10/25/2019 PCP: [name], FNP    Brief Narrative:  69 year old male, obese, with PMH significant for paranoid schizophrenia, prolonged QT interval, non-Hodgkin's lymphoma, hypertension, hyperlipidemia, COPD, diastolic congestive heart failure and chronic kidney disease. Patient lives in a group home and on12/9 patient was sent to the ED for worsening shortness of breath, fever chills and low O2 sat at 89%.   In the ED, patient was started on O2 by nasal cannula. Work-up showed elevated creatinine to 3.09 (up from 1.67). COVID-19 PCR was positive. Chest x-ray revealed multifocal pneumonia.  Patient was admitted to hospital service for further evaluation and management.  Was initially hospitalized for Covid pneumonia for which he completed the treatment. His hospital course was complicated by AKI, acute urinary retention.  Patient completed a course of Decadron, IV remdesivir.  He continues to be encephalopathic with intermittent agitation and drowsiness, currently awaiting SNF placement.  Assessment & Plan:   Principal Problem:   Schizophrenia ([place]) Active Problems:   Sepsis secondary to UTI ([place])   Pneumonia due to severe acute respiratory syndrome coronavirus 2 (SARS-CoV-2)   Obstructive uropathy   Prostatitis   Acute Hypoxic Respiratory Failure due to Acute Covid 19 Viral Pneumonia during the ongoing 2020 Covid 19 Pandemic - POA -Patient presenting with hypoxia on presentation with oxygen saturations 89%.  Covid-19 PCR positive with chest x-ray notable for multifocal pneumonia with increased inflammatory markers.   -CT abdomen/pelvis on 11/10/2019 with nodular appearance right lung base with recommendations of surveillance and follow-up noncontrast CT in 6-8 weeks.  Patient completed a 5-day course of remdesivir on 10/29/2019 and a 10-day course of Decadron on 11/03/2019. --Continue supplemental  oxygen, titrate for goal SPO2 greater than 92% --Have discontinued contact/airborne isolation precautions 11/15/2019; as this is over 21 days from his positive Covid test on 10/25/2019.  Acute kidney injury on CKD stage IV sepsis with shock likely 2/2 UTI versus prostatitis Presenting with a creatinine of 3.09, which is up from his baseline of 1.67.  Renal ultrasound unremarkable with no hydronephrosis or obstruction.  On 11/10/2019, patient with fever and white blood cell count up to 47.2 with a procalcitonin of 22.18.  CT abdomen/pelvis notable for a malpositioned Foley catheter with potential cystitis, a sending urinary tract infection versus prostatitis. --Foley catheter removed on 11/15/2019 --Urine culture with no significant growth --Blood cultures x2: No growth thus far --Transitioned cefepime to cefdinir 300mg  BID, plan 14d total course with stop date 1/8 --cont to follow renal function  Acute metabolic encephalopathy with intermittent hallucinations/agitation, history of schizoaffective disorder On multiple psych medsclozapine, benztropine, Thorazine, Lamictal, Lexapro at home.  Patient was confused and more somnolent during his hospital course.  Psychiatry was consulted with recommendations to discontinue Cogentin, Thorazine, Lamictal. --Seen by psychiatry in reconsultation today, 11/16/2019 --Recently increased clozapine to 150mg  BID per Psych --On cogentin 0.5mg  BID  --Lexapro 10 mg p.o. daily --Continued on Clonazepam 0.5 mg twice daily and 1mg  qHS -Patient with periods of agitation. Had started 25mg  seroquel QHS and consulted Psychiatry for further recommendations. Psychiatry recs to add seroquel 12.5mg  at 8am and 2pm in addition to QHS dosing  Hypernatremia -Resolved --was continued on D5W to 125 cc/hour  --Will hold further IVF -Repeat bmet in AM  Acute renal urinary retention Patient had Foley catheter placed on admission for acute urinary retention.  Attempt at  removal on 1216 with recurrent urinary retention.  On 11/10/2019 patient with dislodgment of  his catheter which was once again replaced. --Foley catheter discontinued on 11/15/2019 --Cont to follow renal function  Drug reaction/fungal infection Currently on lower extremities, left flank, axilla, groin area --continue nystatin topical  Hyperkalemia -Normalized -would follow renal panel  QTC prolongation -QTC 478 on EKG 12/20 --Keep potassium> 4, magnesium> 2 --follow BMP daily -Repeat EKG 1/3 with QTc of 798'X  Chronic diastolicCHF, compensated -2D echo in 09/2018 showed EF of 65 to 21% with LV diastolic dysfunction -Currently stable  Hyperlipidemia --Continue Lipitor as tolerated  Hypothyroidism --Continue levothyroxine 100 mcg p.o. daily - presently stable  Morbid obesity  -BMI 33.9, patient advised diet and weight control  pressure injury Stage II, left buttocks, present on admission --Wound care as per nursing  DVT prophylaxis: Lovenox subq Code Status: Full Family Communication: Pt in room,family not at bedside Disposition Plan: SNF  Consultants:   Psychiatry  Procedures:     Antimicrobials: Anti-infectives (From admission, onward)   Start     Dose/Rate Route Frequency Ordered Stop   11/16/19 1000  cefdinir (OMNICEF) capsule 300 mg     300 mg Oral Every 12 hours 11/16/19 0858 11/24/19 0959   11/14/19 2200  ceFEPIme (MAXIPIME) 2 g in sodium chloride 0.9 % 100 mL IVPB  Status:  Discontinued     2 g 200 mL/hr over 30 Minutes Intravenous Every 12 hours 11/14/19 1204 11/16/19 0858   11/10/19 1700  ceFEPIme (MAXIPIME) 2 g in sodium chloride 0.9 % 100 mL IVPB  Status:  Discontinued     2 g 200 mL/hr over 30 Minutes Intravenous Daily 11/10/19 1648 11/14/19 1204   10/26/19 1000  remdesivir 100 mg in sodium chloride 0.9 % 100 mL IVPB  Status:  Discontinued     100 mg 200 mL/hr over 30 Minutes Intravenous Daily 10/25/19 2229 10/25/19 2254   10/26/19  1000  remdesivir 100 mg in sodium chloride 0.9 % 100 mL IVPB     100 mg 200 mL/hr over 30 Minutes Intravenous Daily 10/25/19 2135 10/29/19 1045   10/25/19 2230  cefTRIAXone (ROCEPHIN) 1 g in sodium chloride 0.9 % 100 mL IVPB  Status:  Discontinued     1 g 200 mL/hr over 30 Minutes Intravenous Every 24 hours 10/25/19 2228 10/30/19 1746   10/25/19 2230  doxycycline (VIBRA-TABS) tablet 100 mg  Status:  Discontinued     100 mg Oral Every 12 hours 10/25/19 2229 10/30/19 1746   10/25/19 2228  remdesivir 200 mg in sodium chloride 0.9% 250 mL IVPB  Status:  Discontinued     200 mg 580 mL/hr over 30 Minutes Intravenous Once 10/25/19 2229 10/25/19 2254   10/25/19 2200  remdesivir 200 mg in sodium chloride 0.9% 250 mL IVPB     200 mg 580 mL/hr over 30 Minutes Intravenous Once 10/25/19 2135 10/26/19 0256      Subjective: No complaints this AM  Objective: Vitals:   11/21/19 0647 11/21/19 0931 11/21/19 1251 11/21/19 1311  BP: 108/67  111/69   Pulse: 95  (!) 111 (!) 108  Resp: 18  18   Temp:   98.5 F (36.9 C)   TempSrc:   Oral   SpO2: 99% 98% 99% 96%  Weight:      Height:        Intake/Output Summary (Last 24 hours) at 11/21/2019 1506 Last data filed at 11/21/2019 0655 Gross per 24 hour  Intake 2475.84 ml  Output 2095 ml  Net 380.84 ml   Filed Weights   11/08/19 1324  11/15/19 0700 11/16/19 1100  Weight: 113 kg 113.3 kg 111.9 kg    Examination: General exam: Awake, laying in bed, in nad Respiratory system: Normal respiratory effort, no wheezing Cardiovascular system: regular rate, s1, s2 Gastrointestinal system: Soft, nondistended, positive BS Central nervous system: CN2-12 grossly intact, strength intact Extremities: Perfused, no clubbing Skin: Normal skin turgor, no notable skin lesions seen Psychiatry: difficult to assess given mentation   Data Reviewed: I have personally reviewed following labs and imaging studies  CBC: Recent Labs  Lab 11/16/19 0335 11/18/19 0524  11/19/19 0330 11/20/19 0343 11/21/19 0409  WBC 8.1 9.8 10.6* 10.6* 15.9*  NEUTROABS  --   --   --   --  12.1*  HGB 10.5* 10.3* 10.4* 10.9* 10.7*  HCT 33.3* 32.1* 32.8* 34.4* 33.7*  MCV 96.8 97.3 98.8 98.3 97.4  PLT 130* 172 187 195 627   Basic Metabolic Panel: Recent Labs  Lab 11/17/19 0306 11/18/19 0524 11/19/19 0330 11/20/19 0343 11/21/19 0409  NA 141 141 141 141 138  K 3.8 4.1 3.7 3.6 3.7  CL 106 103 104 103 102  CO2 27 29 30 29 28   GLUCOSE 149* 173* 156* 155* 157*  BUN 12 17 18 14 15   CREATININE 1.41* 1.47* 1.63* 1.66* 1.75*  CALCIUM 9.6 9.8 9.6 9.7 9.4  MG  --   --   --  1.9  --    GFR: Estimated Creatinine Clearance: 53.6 mL/min (A) (by C-G formula based on SCr of 1.75 mg/dL (H)). Liver Function Tests: Recent Labs  Lab 11/20/19 0343 11/21/19 0409  AST 16 20  ALT 20 20  ALKPHOS 69 75  BILITOT 0.5 0.4  PROT 5.9* 5.6*  ALBUMIN 3.0* 2.9*   No results for input(s): LIPASE, AMYLASE in the last 168 hours. No results for input(s): AMMONIA in the last 168 hours. Coagulation Profile: No results for input(s): INR, PROTIME in the last 168 hours. Cardiac Enzymes: No results for input(s): CKTOTAL, CKMB, CKMBINDEX, TROPONINI in the last 168 hours. BNP (last 3 results) No results for input(s): PROBNP in the last 8760 hours. HbA1C: No results for input(s): HGBA1C in the last 72 hours. CBG: No results for input(s): GLUCAP in the last 168 hours. Lipid Profile: No results for input(s): CHOL, HDL, LDLCALC, TRIG, CHOLHDL, LDLDIRECT in the last 72 hours. Thyroid Function Tests: No results for input(s): TSH, T4TOTAL, FREET4, T3FREE, THYROIDAB in the last 72 hours. Anemia Panel: No results for input(s): VITAMINB12, FOLATE, FERRITIN, TIBC, IRON, RETICCTPCT in the last 72 hours. Sepsis Labs: No results for input(s): PROCALCITON, LATICACIDVEN in the last 168 hours.  No results found for this or any previous visit (from the past 240 hour(s)).   Radiology Studies: No  results found.  Scheduled Meds: . aspirin EC  81 mg Oral Daily  . atorvastatin  20 mg Oral q1800  . benztropine  0.5 mg Oral BID  . cefdinir  300 mg Oral Q12H  . cholecalciferol  5,000 Units Oral Daily  . clonazePAM  1 mg Oral QHS  . clotrimazole   Topical BID  . cloZAPine  150 mg Oral BID  . docusate sodium  100 mg Oral Daily  . enoxaparin (LOVENOX) injection  40 mg Subcutaneous QHS  . escitalopram  10 mg Oral QAC breakfast  . feeding supplement (ENSURE ENLIVE)  237 mL Oral BID BM  . fluticasone  1 puff Inhalation BID  . folic acid  1 mg Oral Daily  . Ipratropium-Albuterol  1 puff Inhalation BID  .  levothyroxine  100 mcg Oral Q0600  . mouth rinse  15 mL Mouth Rinse BID  . multivitamin with minerals  1 tablet Oral Daily  . nystatin  1 Bottle Topical Daily  . polyethylene glycol  17 g Oral Daily  . QUEtiapine  12.5 mg Oral BID  . QUEtiapine  25 mg Oral QHS  . senna  1 tablet Oral Daily  . sodium chloride flush  10-40 mL Intracatheter Q12H  . thiamine  100 mg Oral Daily  . vitamin C  500 mg Oral Daily  . zinc sulfate  220 mg Oral Daily   Continuous Infusions: . dextrose 125 mL/hr at 11/21/19 0151     LOS: 27 days   Marylu Lund, MD [facility] Pager On Amion  If 7PM-7AM, please contact night-coverage 11/21/2019, 3:06 PM

## 2019-11-22 DIAGNOSIS — N390 Urinary tract infection, site not specified: Secondary | ICD-10-CM

## 2019-11-22 DIAGNOSIS — N410 Acute prostatitis: Secondary | ICD-10-CM

## 2019-11-22 DIAGNOSIS — A419 Sepsis, unspecified organism: Secondary | ICD-10-CM

## 2019-11-22 DIAGNOSIS — N139 Obstructive and reflux uropathy, unspecified: Secondary | ICD-10-CM

## 2019-11-22 LAB — CBC
HCT: 34.3 % — ABNORMAL LOW (ref 39.0–52.0)
Hemoglobin: 10.8 g/dL — ABNORMAL LOW (ref 13.0–17.0)
MCH: 31 pg (ref 26.0–34.0)
MCHC: 31.5 g/dL (ref 30.0–36.0)
MCV: 98.6 fL (ref 80.0–100.0)
Platelets: 213 10*3/uL (ref 150–400)
RBC: 3.48 MIL/uL — ABNORMAL LOW (ref 4.22–5.81)
RDW: 14.9 % (ref 11.5–15.5)
WBC: 12.3 10*3/uL — ABNORMAL HIGH (ref 4.0–10.5)
nRBC: 0 % (ref 0.0–0.2)

## 2019-11-22 LAB — COMPREHENSIVE METABOLIC PANEL
ALT: 20 U/L (ref 0–44)
AST: 19 U/L (ref 15–41)
Albumin: 2.9 g/dL — ABNORMAL LOW (ref 3.5–5.0)
Alkaline Phosphatase: 77 U/L (ref 38–126)
Anion gap: 7 (ref 5–15)
BUN: 14 mg/dL (ref 8–23)
CO2: 30 mmol/L (ref 22–32)
Calcium: 9.7 mg/dL (ref 8.9–10.3)
Chloride: 104 mmol/L (ref 98–111)
Creatinine, Ser: 1.71 mg/dL — ABNORMAL HIGH (ref 0.61–1.24)
GFR calc Af Amer: 47 mL/min — ABNORMAL LOW (ref >60)
GFR calc non Af Amer: 41 mL/min — ABNORMAL LOW (ref >60)
Glucose, Bld: 128 mg/dL — ABNORMAL HIGH (ref 70–99)
Potassium: 4 mmol/L (ref 3.5–5.1)
Sodium: 141 mmol/L (ref 135–145)
Total Bilirubin: 0.4 mg/dL (ref 0.3–1.2)
Total Protein: 5.6 g/dL — ABNORMAL LOW (ref 6.5–8.1)

## 2019-11-22 NOTE — Progress Notes (Signed)
Physical Therapy Treatment Patient Details Name: [name] MRN: [id number] DOB: [birth year] Today's Date: 11/22/2019    History of Present Illness 69 yo male admitted with COVID (+). Hx of schizoaffective d/o, NHL, CKD, obesity, CHF, COPD. Pt was admitted from a group home.    PT Comments    Pt very cooperative/able to participate with PT today. Completed  repeated sit to stand trials as well as pre-gait today. Will continue to follow ina cute setting. Continue to recommend SNF  Follow Up Recommendations  SNF     Equipment Recommendations  None recommended by PT    Recommendations for Other Services       Precautions / Restrictions Precautions Precautions: Fall Precaution Comments: Schizophrenia resides in a Group Home Restrictions Weight Bearing Restrictions: No    Mobility  Bed Mobility Overal bed mobility: Needs Assistance Bed Mobility: Supine to Sit;Sit to Supine     Supine to sit: Min guard;HOB elevated Sit to supine: Min guard   General bed mobility comments: cues for task initiation and completion, min/guard for safety   Transfers Overall transfer level: Needs assistance Equipment used: Rolling walker (2 wheeled) Transfers: Sit to/from Stand Sit to Stand: Min assist;Mod assist;+2 physical assistance;+2 safety/equipment;From elevated surface         General transfer comment: sit to stand trials repeated x5 fro strengthening and activity tolerance. +2 assist for anterior-superior wt shift, for overall safety.  multi-modal cues for task completion and safety   Ambulation/Gait             General Gait Details: +2 min assist for lateral wt shifting/ lateral steps along EOB/pre gait activities    Stairs             Wheelchair Mobility    Modified Rankin (Stroke Patients Only)       Balance   Sitting-balance support: Feet supported Sitting balance-Leahy Scale: Fair Sitting balance - Comments: min/guard to close supervision for  safety   Standing balance support: Bilateral upper extremity supported Standing balance-Leahy Scale: Poor Standing balance comment: RW and additional assist from therapist. able to maintain standing ~ 30 seconds each trial.  bil knees flexed ~ 25degrees, trunk flexed, pt is able to partially correct with multi-modal cues                             Cognition Arousal/Alertness: Awake/alert Behavior During Therapy: Flat affect Overall Cognitive Status: Impaired/Different from baseline Area of Impairment: Attention;Safety/judgement;Problem solving;Following commands                   Current Attention Level: Sustained   Following Commands: Follows one step commands with increased time;Follows multi-step commands inconsistently Safety/Judgement: Decreased awareness of safety;Decreased awareness of deficits   Problem Solving: Slow processing;Decreased initiation;Difficulty sequencing;Requires verbal cues General Comments: pt cooperative with PT. repeats that he is "miserable"       Exercises      General Comments        Pertinent Vitals/Pain Faces Pain Scale: Hurts a little bit Pain Location: groin area Pain Descriptors / Indicators: Grimacing Pain Intervention(s): Monitored during session    Home Living                      Prior Function            PT Goals (current goals can now be found in the care plan section) Acute Rehab PT Goals Patient Stated Goal:  unable to state PT Goal Formulation: With patient Time For Goal Achievement: 12/06/19 Potential to Achieve Goals: Fair Progress towards PT goals: Progressing toward goals    Frequency    Min 2X/week      PT Plan Current plan remains appropriate    Co-evaluation              AM-PAC PT "6 Clicks" Mobility   Outcome Measure  Help needed turning from your back to your side while in a flat bed without using bedrails?: A Little Help needed moving from lying on your back to  sitting on the side of a flat bed without using bedrails?: A Little Help needed moving to and from a bed to a chair (including a wheelchair)?: A Little Help needed standing up from a chair using your arms (e.g., wheelchair or bedside chair)?: A Little Help needed to walk in hospital room?: A Lot Help needed climbing 3-5 steps with a railing? : Total 6 Click Score: 15    End of Session Equipment Utilized During Treatment: Gait belt Activity Tolerance: Patient tolerated treatment well Patient left: in bed;with call bell/phone within reach;with restraints reapplied;with bed alarm set Nurse Communication: Mobility status PT Visit Diagnosis: Unsteadiness on feet (R26.81);Difficulty in walking, not elsewhere classified (R26.2);Muscle weakness (generalized) (M62.81)     Time: 2951-8841 PT Time Calculation (min) (ACUTE ONLY): 14 min  Charges:  $Therapeutic Activity: 8-22 mins                     Baxter Flattery, PT   Acute Rehab Dept [facility]): [phone]   11/22/2019    [facility] 11/22/2019, 11:34 AM

## 2019-11-22 NOTE — Progress Notes (Signed)
Occupational Therapy Treatment Patient Details Name: [name] MRN: [id number] DOB: Feb 27, 1951 Today's Date: 11/22/2019    History of present illness 69 yo male admitted with COVID (+). Hx of schizoaffective d/o, NHL, CKD, obesity, CHF, COPD. Pt was admitted from a group home.   OT comments  OT session focused on self feeding  Follow Up Recommendations  SNF;Supervision/Assistance - 24 hour    Equipment Recommendations  None recommended by OT    Recommendations for Other Services      Precautions / Restrictions Precautions Precautions: Fall Precaution Comments: Schizophrenia resides in a Group Home Restrictions Weight Bearing Restrictions: No       Mobility Bed Mobility Overal bed mobility: Needs Assistance Bed Mobility: Supine to Sit;Sit to Supine     Supine to sit: Min guard;HOB elevated Sit to supine: Min guard   General bed mobility comments: cues for task initiation and completion, min/guard for safety   Transfers Overall transfer level: Needs assistance Equipment used: Rolling walker (2 wheeled) Transfers: Sit to/from Stand Sit to Stand: Min assist;Mod assist;+2 physical assistance;+2 safety/equipment;From elevated surface         General transfer comment: sit to stand trials repeated x5 fro strengthening and activity tolerance. +2 assist for anterior-superior wt shift, for overall safety.  multi-modal cues for task completion and safety     Balance   Sitting-balance support: Feet supported Sitting balance-Leahy Scale: Fair Sitting balance - Comments: min/guard to close supervision for safety   Standing balance support: Bilateral upper extremity supported Standing balance-Leahy Scale: Poor Standing balance comment: RW and additional assist from therapist. able to maintain standing ~ 30 seconds each trial.  bil knees flexed ~ 25degrees, trunk flexed, pt is able to partially correct with multi-modal cues                            ADL  either performed or assessed with clinical judgement   ADL Overall ADL's : Needs assistance/impaired Eating/Feeding: Minimal assistance;Bed level Eating/Feeding Details (indicate cue type and reason): HOB raised.  VC for holding bowl in a manner he would get to it easier.  he would often say - Mam, I am sick - as he was eating his ice cream                                   General ADL Comments: OT session focused on self feeding, holding bowl and spoon in a manner that hecould easier  manipulate.     Vision Baseline Vision/History: No visual deficits     Perception     Praxis      Cognition Arousal/Alertness: Awake/alert Behavior During Therapy: Flat affect Overall Cognitive Status: History of cognitive impairments - at baseline Area of Impairment: Attention;Safety/judgement;Problem solving;Following commands                   Current Attention Level: Sustained   Following Commands: Follows one step commands with increased time;Follows multi-step commands inconsistently Safety/Judgement: Decreased awareness of safety;Decreased awareness of deficits   Problem Solving: Slow processing;Decreased initiation;Difficulty sequencing;Requires verbal cues General Comments: follows commands inconsistently with multimodal cues                   Pertinent Vitals/ Pain       Pain Assessment: No/denies pain Faces Pain Scale: Hurts a little bit Pain Location: groin area Pain Descriptors / Indicators:  Grimacing Pain Intervention(s): Monitored during session      Frequency  Min 2X/week        Progress Toward Goals  OT Goals(current goals can now be found in the care plan section)  Progress towards OT goals: Progressing toward goals  Acute Rehab OT Goals Patient Stated Goal: unable to state OT Goal Formulation: Patient unable to participate in goal setting Time For Goal Achievement: 12/01/19  Plan Discharge plan remains appropriate       AM-PAC  OT "6 Clicks" Daily Activity     Outcome Measure   Help from another person eating meals?: A Little Help from another person taking care of personal grooming?: A Lot Help from another person toileting, which includes using toliet, bedpan, or urinal?: Total Help from another person bathing (including washing, rinsing, drying)?: Total Help from another person to put on and taking off regular upper body clothing?: Total Help from another person to put on and taking off regular lower body clothing?: Total 6 Click Score: 9    End of Session    OT Visit Diagnosis: Unsteadiness on feet (R26.81);Muscle weakness (generalized) (M62.81);Other abnormalities of gait and mobility (R26.89);Other symptoms and signs involving cognitive function   Activity Tolerance Patient tolerated treatment well   Patient Left in bed;with call bell/phone within reach;with bed alarm set   Nurse Communication (aware IV line is not running)        Time: 2956-2130 OT Time Calculation (min): 13 min  Charges: OT General Charges $OT Visit: 1 Visit OT Treatments $Self Care/Home Management : 8-22 mins  Kari Baars, [place] [phone] Office- [place]ia Weatherly, Edwena Felty D 11/22/2019, 12:52 PM

## 2019-11-22 NOTE — Progress Notes (Signed)
PROGRESS NOTE    ADRIANA LINA  QQI:[id number] DOB: [birth year] DOA: 10/25/2019 PCP: [name], FNP    Brief Narrative:  69 year old male, obese, with PMH significant for paranoid schizophrenia, prolonged QT interval, non-Hodgkin's lymphoma, hypertension, hyperlipidemia, COPD, diastolic congestive heart failure and chronic kidney disease. Patient lives in a group home and on12/9 patient was sent to the ED for worsening shortness of breath, fever chills and low O2 sat at 89%. In the ED, patient was started on O2 by nasal cannula. Work-up showed elevated creatinine to 3.09 (up from 1.67). COVID-19 PCR was positive. Chest x-ray revealed multifocal pneumonia. Patient was admitted to hospital service for further evaluation and management. Hospital course was complicated by AKI, acute urinary retention.  Patient completed a course of Decadron, IV remdesivir.  He continues to be encephalopathic with intermittent agitation and drowsiness, currently awaiting SNF placement.  Assessment & Plan:   Principal Problem:   Schizophrenia ([place]) Active Problems:   Sepsis secondary to UTI ([place])   COVID-19 virus infection   Obstructive uropathy   Prostatitis   Acute Hypoxic Respiratory Failure due to Covid 19 Viral Pneumonia- POA Improved, currently on room air, occasionally needs supplemental oxygen CT abdomen/pelvis on 11/10/2019 with nodular appearance right lung base with recommendations of surveillance and follow-up noncontrast CT in 6-8 weeks Patient completed a 5-day course of remdesivir on 10/29/2019 and a 10-day course of Decadron on 11/03/2019 Continue supplemental oxygen prn Discontinued contact/airborne isolation precautions 11/15/2019; as this is over 21 days from his positive Covid test on 10/25/2019  Acute kidney injury on CKD stage IV Sepsis with shock likely 2/2 UTI versus prostatitis Presenting with a creatinine of 3.09, which is up from his baseline of 1.67.  Renal ultrasound  unremarkable with no hydronephrosis or obstruction.  On 11/10/2019, patient with fever and white blood cell count up to 47.2 with a procalcitonin of 22.18.  CT abdomen/pelvis notable for a malpositioned Foley catheter with potential cystitis, a sending urinary tract infection versus prostatitis. Foley catheter removed on 11/15/2019 Urine culture with no significant growth Blood cultures x2: No growth thus far Transitioned cefepime to cefdinir 300mg  BID, plan 14d total course with stop date 1/8 Daily BMP  Acute metabolic encephalopathy with intermittent hallucinations/agitation, history of schizoaffective disorder On multiple psych medsclozapine, benztropine, Thorazine, Lamictal, Lexapro at home. Patient was confused and more somnolent during his hospital course Psychiatry was consulted with multiple medication adjustments  Acute renal urinary retention Resolved Foley catheter discontinued on 11/15/2019  Drug reaction/fungal infection Currently on lower extremities, left flank, axilla, groin area Continue nystatin topical  QTC prolongation Repeat EKG 1/3 with QTc of 480's Keep potassium> 4, magnesium> 2  Chronic diastolicCHF, compensated Stable 2D echo in 09/2018 showed EF of 65 to 94% with LV diastolic dysfunction  Hyperlipidemia Continue Lipitor as tolerated  Hypothyroidism Continue levothyroxine  Obesity  Lifestyle modification advised  Pressure injury Stage II, left buttocks, present on admission Wound care as per nursing     DVT prophylaxis: Lovenox subq Code Status: Full Family Communication: None at bedside Disposition Plan: SNF  Consultants:   Psychiatry  Procedures:     Antimicrobials: Anti-infectives (From admission, onward)   Start     Dose/Rate Route Frequency Ordered Stop   11/16/19 1000  cefdinir (OMNICEF) capsule 300 mg     300 mg Oral Every 12 hours 11/16/19 0858 11/24/19 0959   11/14/19 2200  ceFEPIme (MAXIPIME) 2 g in sodium  chloride 0.9 % 100 mL IVPB  Status:  Discontinued  2 g 200 mL/hr over 30 Minutes Intravenous Every 12 hours 11/14/19 1204 11/16/19 0858   11/10/19 1700  ceFEPIme (MAXIPIME) 2 g in sodium chloride 0.9 % 100 mL IVPB  Status:  Discontinued     2 g 200 mL/hr over 30 Minutes Intravenous Daily 11/10/19 1648 11/14/19 1204   10/26/19 1000  remdesivir 100 mg in sodium chloride 0.9 % 100 mL IVPB  Status:  Discontinued     100 mg 200 mL/hr over 30 Minutes Intravenous Daily 10/25/19 2229 10/25/19 2254   10/26/19 1000  remdesivir 100 mg in sodium chloride 0.9 % 100 mL IVPB     100 mg 200 mL/hr over 30 Minutes Intravenous Daily 10/25/19 2135 10/29/19 1045   10/25/19 2230  cefTRIAXone (ROCEPHIN) 1 g in sodium chloride 0.9 % 100 mL IVPB  Status:  Discontinued     1 g 200 mL/hr over 30 Minutes Intravenous Every 24 hours 10/25/19 2228 10/30/19 1746   10/25/19 2230  doxycycline (VIBRA-TABS) tablet 100 mg  Status:  Discontinued     100 mg Oral Every 12 hours 10/25/19 2229 10/30/19 1746   10/25/19 2228  remdesivir 200 mg in sodium chloride 0.9% 250 mL IVPB  Status:  Discontinued     200 mg 580 mL/hr over 30 Minutes Intravenous Once 10/25/19 2229 10/25/19 2254   10/25/19 2200  remdesivir 200 mg in sodium chloride 0.9% 250 mL IVPB     200 mg 580 mL/hr over 30 Minutes Intravenous Once 10/25/19 2135 10/26/19 0256      Subjective: Patient seen and examined at bedside, a bit talkative this morning.  Denies any new complaints.  States he wants to see Jesus  Objective: Vitals:   11/22/19 0528 11/22/19 0737 11/22/19 0959 11/22/19 1442  BP: 128/77  (!) 113/49 (!) 114/55  Pulse: (!) 101  91 100  Resp: 18   15  Temp: 97.8 F (36.6 C)  98 F (36.7 C) 98.3 F (36.8 C)  TempSrc: Oral  Oral Oral  SpO2: 97% 94% 99% 100%  Weight:      Height:        Intake/Output Summary (Last 24 hours) at 11/22/2019 1633 Last data filed at 11/22/2019 1212 Gross per 24 hour  Intake 1339.17 ml  Output 800 ml  Net 539.17 ml     Filed Weights   11/08/19 1324 11/15/19 0700 11/16/19 1100  Weight: 113 kg 113.3 kg 111.9 kg    Examination:  General: NAD   Cardiovascular: S1, S2 present  Respiratory: CTAB  Abdomen: Soft, nontender, nondistended, bowel sounds present  Musculoskeletal: No bilateral pedal edema noted  Skin: Normal  Psychiatry: Normal mood  Data Reviewed: I have personally reviewed following labs and imaging studies  CBC: Recent Labs  Lab 11/18/19 0524 11/19/19 0330 11/20/19 0343 11/21/19 0409 11/22/19 0343  WBC 9.8 10.6* 10.6* 15.9* 12.3*  NEUTROABS  --   --   --  12.1*  --   HGB 10.3* 10.4* 10.9* 10.7* 10.8*  HCT 32.1* 32.8* 34.4* 33.7* 34.3*  MCV 97.3 98.8 98.3 97.4 98.6  PLT 172 187 195 210 950   Basic Metabolic Panel: Recent Labs  Lab 11/18/19 0524 11/19/19 0330 11/20/19 0343 11/21/19 0409 11/22/19 0343  NA 141 141 141 138 141  K 4.1 3.7 3.6 3.7 4.0  CL 103 104 103 102 104  CO2 29 30 29 28 30   GLUCOSE 173* 156* 155* 157* 128*  BUN 17 18 14 15 14   CREATININE 1.47* 1.63* 1.66* 1.75* 1.71*  CALCIUM 9.8 9.6 9.7 9.4 9.7  MG  --   --  1.9  --   --    GFR: Estimated Creatinine Clearance: 54.9 mL/min (A) (by C-G formula based on SCr of 1.71 mg/dL (H)). Liver Function Tests: Recent Labs  Lab 11/20/19 0343 11/21/19 0409 11/22/19 0343  AST 16 20 19   ALT 20 20 20   ALKPHOS 69 75 77  BILITOT 0.5 0.4 0.4  PROT 5.9* 5.6* 5.6*  ALBUMIN 3.0* 2.9* 2.9*   No results for input(s): LIPASE, AMYLASE in the last 168 hours. No results for input(s): AMMONIA in the last 168 hours. Coagulation Profile: No results for input(s): INR, PROTIME in the last 168 hours. Cardiac Enzymes: No results for input(s): CKTOTAL, CKMB, CKMBINDEX, TROPONINI in the last 168 hours. BNP (last 3 results) No results for input(s): PROBNP in the last 8760 hours. HbA1C: No results for input(s): HGBA1C in the last 72 hours. CBG: No results for input(s): GLUCAP in the last 168 hours. Lipid  Profile: No results for input(s): CHOL, HDL, LDLCALC, TRIG, CHOLHDL, LDLDIRECT in the last 72 hours. Thyroid Function Tests: No results for input(s): TSH, T4TOTAL, FREET4, T3FREE, THYROIDAB in the last 72 hours. Anemia Panel: No results for input(s): VITAMINB12, FOLATE, FERRITIN, TIBC, IRON, RETICCTPCT in the last 72 hours. Sepsis Labs: No results for input(s): PROCALCITON, LATICACIDVEN in the last 168 hours.  No results found for this or any previous visit (from the past 240 hour(s)).   Radiology Studies: No results found.  Scheduled Meds: . aspirin EC  81 mg Oral Daily  . atorvastatin  20 mg Oral q1800  . benztropine  0.5 mg Oral BID  . cefdinir  300 mg Oral Q12H  . cholecalciferol  5,000 Units Oral Daily  . clonazePAM  1 mg Oral QHS  . clotrimazole   Topical BID  . cloZAPine  150 mg Oral BID  . docusate sodium  100 mg Oral Daily  . enoxaparin (LOVENOX) injection  40 mg Subcutaneous QHS  . escitalopram  10 mg Oral QAC breakfast  . feeding supplement (ENSURE ENLIVE)  237 mL Oral BID BM  . fluticasone  1 puff Inhalation BID  . folic acid  1 mg Oral Daily  . Ipratropium-Albuterol  1 puff Inhalation BID  . levothyroxine  100 mcg Oral Q0600  . mouth rinse  15 mL Mouth Rinse BID  . multivitamin with minerals  1 tablet Oral Daily  . nystatin  1 Bottle Topical Daily  . polyethylene glycol  17 g Oral Daily  . QUEtiapine  12.5 mg Oral BID  . QUEtiapine  25 mg Oral QHS  . senna  1 tablet Oral Daily  . sodium chloride flush  10-40 mL Intracatheter Q12H  . thiamine  100 mg Oral Daily  . vitamin C  500 mg Oral Daily  . zinc sulfate  220 mg Oral Daily   Continuous Infusions:    LOS: 28 days   Alma Friendly, MD [facility] Pager On Amion  If 7PM-7AM, please contact night-coverage 11/22/2019, 4:33 PM

## 2019-11-23 LAB — BASIC METABOLIC PANEL
Anion gap: 8 (ref 5–15)
BUN: 25 mg/dL — ABNORMAL HIGH (ref 8–23)
CO2: 29 mmol/L (ref 22–32)
Calcium: 9.2 mg/dL (ref 8.9–10.3)
Chloride: 101 mmol/L (ref 98–111)
Creatinine, Ser: 2 mg/dL — ABNORMAL HIGH (ref 0.61–1.24)
GFR calc Af Amer: 39 mL/min — ABNORMAL LOW (ref >60)
GFR calc non Af Amer: 34 mL/min — ABNORMAL LOW (ref >60)
Glucose, Bld: 127 mg/dL — ABNORMAL HIGH (ref 70–99)
Potassium: 4.3 mmol/L (ref 3.5–5.1)
Sodium: 138 mmol/L (ref 135–145)

## 2019-11-23 LAB — CBC WITH DIFFERENTIAL/PLATELET
Abs Immature Granulocytes: 0.11 10*3/uL — ABNORMAL HIGH (ref 0.00–0.07)
Basophils Absolute: 0.1 10*3/uL (ref 0.0–0.1)
Basophils Relative: 0 %
Eosinophils Absolute: 0.3 10*3/uL (ref 0.0–0.5)
Eosinophils Relative: 2 %
HCT: 33.5 % — ABNORMAL LOW (ref 39.0–52.0)
Hemoglobin: 10.6 g/dL — ABNORMAL LOW (ref 13.0–17.0)
Immature Granulocytes: 1 %
Lymphocytes Relative: 18 %
Lymphs Abs: 2.5 10*3/uL (ref 0.7–4.0)
MCH: 31.2 pg (ref 26.0–34.0)
MCHC: 31.6 g/dL (ref 30.0–36.0)
MCV: 98.5 fL (ref 80.0–100.0)
Monocytes Absolute: 0.8 10*3/uL (ref 0.1–1.0)
Monocytes Relative: 6 %
Neutro Abs: 9.9 10*3/uL — ABNORMAL HIGH (ref 1.7–7.7)
Neutrophils Relative %: 73 %
Platelets: 241 10*3/uL (ref 150–400)
RBC: 3.4 MIL/uL — ABNORMAL LOW (ref 4.22–5.81)
RDW: 15.1 % (ref 11.5–15.5)
WBC: 13.6 10*3/uL — ABNORMAL HIGH (ref 4.0–10.5)
nRBC: 0 % (ref 0.0–0.2)

## 2019-11-23 NOTE — TOC Progression Note (Addendum)
Transition of Care [facility]) - Progression Note    Patient Details  Name: [name] MRN: [id number] Date of Birth: 08-Sep-1951  Transition of Care [facility]) CM/SW Contact  Leeroy Cha, RN Phone Number: 11/23/2019, 10:13 AM  Clinical Narrative:    [name]. They can not handle anyone that is not independent or with behavior problems.  Facility is not  equipped to handle any medical problems. Will speak with [facility] who has accepted patient.  Also caller states that [facility] in [place] may be available. tct kathy campbell at [email or web address] left to return call.  unable to reach [name] tct-[name] with admissions at [facility] message left to please return call.    Expected Discharge Plan and Services                                                 Social Determinants of Health (SDOH) Interventions    Readmission Risk Interventions Readmission Risk Prevention Plan 07/20/2018  Transportation Screening Complete  PCP or Specialist Appt within 3-5 Days Complete  Home Care Screening Complete  HRI or Home Care Consult Complete  Social Work Consult for [place] Planning/Counseling Complete  Palliative Care Screening Complete  Medication Review [employer]) Complete  Some recent data might be hidden

## 2019-11-23 NOTE — Progress Notes (Signed)
Nutrition Follow-up  INTERVENTION:   -Ensure Enlive po BID, each supplement provides 350 kcal and 20 grams of protein -Magic cup TID with meals, each supplement provides 290 kcal and 9 grams of protein  NUTRITION DIAGNOSIS:   Increased nutrient needs related to acute illness as evidenced by estimated needs.  Ongoing.  GOAL:   Patient will meet greater than or equal to 90% of their needs  Progressing.  MONITOR:   PO intake, Supplement acceptance, Labs, Weight trends  ASSESSMENT:   69 year old male with medical history of obesity, paranoid schizophrenia, prolonged QT interval, non-Hodgkin's lymphoma, HTN, hyperlipidemia, COPD, CHF, and CKD. He lives in a group home. He was sent to the ED on 12/9 for worsening SOB, fever, chills, and low O2 sat (89%). COVID-19 PCR was positive and CXR showed multifocal PNA. He has completed treatment for COVID; hospital course complicated by AKI, acute urinary retention, and drug reaction vs fungal infection to groin, L axilla, and L flank.  Patient is currently consuming 30- 100% of meals at this time. Pt is drinking Ensure supplements.  Per MD note, pt is currently awaiting SNF placement.  Admission weight: 405 lbs (suspect error). 12/22: 250 lbs. No new weight has been measured since 12/31.   Labs reviewed. Medications: Vitamin D tablet, folic acid tablet, Multivitamin with minerals daily, thiamine tablet, Vitamin C tablet, Zinc sulfate capsule  Diet Order:   Diet Order            Diet Heart Room service appropriate? Yes; Fluid consistency: Thin  Diet effective now              EDUCATION NEEDS:   No education needs have been identified at this time  Skin:  Skin Assessment: Skin Integrity Issues: Skin Integrity Issues:: DTI, Stage I DTI: left buttocks Stage I: left ankle, left knee  Last BM:  1/4 -type 4  Height:   Ht Readings from Last 1 Encounters:  10/26/19 6' (1.829 m)    Weight:   Wt Readings from Last 1 Encounters:   11/16/19 111.9 kg    Ideal Body Weight:  80.9 kg  BMI:  Body mass index is 33.46 kg/m.  Estimated Nutritional Needs:   Kcal:  2150-2350 kcal  Protein:  110-120 grams  Fluid:  >/= 2.2 L/day  Clayton Bibles, MS, RD, LDN Inpatient Clinical Dietitian Pager: [phone] After Hours Pager: [phone] b

## 2019-11-23 NOTE — Progress Notes (Signed)
PROGRESS NOTE    [name]  WJX:[id number] DOB: 1951-06-25 DOA: 10/25/2019 PCP: [name], FNP    Brief Narrative:  69 year old male, obese, with PMH significant for paranoid schizophrenia, prolonged QT interval, non-Hodgkin's lymphoma, hypertension, hyperlipidemia, COPD, diastolic congestive heart failure and chronic kidney disease. Patient lives in a group home and on12/9 patient was sent to the ED for worsening shortness of breath, fever chills and low O2 sat at 89%. In the ED, patient was started on O2 by nasal cannula. Work-up showed elevated creatinine to 3.09 (up from 1.67). COVID-19 PCR was positive. Chest x-ray revealed multifocal pneumonia. Patient was admitted to hospital service for further evaluation and management. Hospital course was complicated by AKI, acute urinary retention.  Patient completed a course of Decadron, IV remdesivir.  He continues to be encephalopathic with intermittent agitation and drowsiness, currently awaiting SNF placement.  Assessment & Plan:   Principal Problem:   Schizophrenia ([place]) Active Problems:   Sepsis secondary to UTI ([place])   COVID-19 virus infection   Obstructive uropathy   Prostatitis   Acute Hypoxic Respiratory Failure due to Covid 19 Viral Pneumonia- POA Improved, currently on room air, occasionally needs supplemental oxygen CT abdomen/pelvis on 11/10/2019 with nodular appearance right lung base with recommendations of surveillance and follow-up noncontrast CT in 6-8 weeks Patient completed a 5-day course of remdesivir on 10/29/2019 and a 10-day course of Decadron on 11/03/2019 Continue supplemental oxygen prn Discontinued contact/airborne isolation precautions 11/15/2019; as this is over 21 days from his positive Covid test on 10/25/2019  Acute kidney injury on CKD stage IV Sepsis with shock likely 2/2 UTI versus prostatitis Cr noted to be slowly rising, will bladder scan Q shift Presenting with a creatinine of 3.09,  which is up from his baseline of 1.67.  Renal ultrasound unremarkable with no hydronephrosis or obstruction.  On 11/10/2019, patient with fever and white blood cell count up to 47.2 with a procalcitonin of 22.18.  CT abdomen/pelvis notable for a malpositioned Foley catheter with potential cystitis, a sending urinary tract infection versus prostatitis. Foley catheter removed on 11/15/2019 Urine culture with no significant growth Blood cultures x2: No growth thus far Transitioned cefepime to cefdinir 300mg  BID, plan 14d total course with stop date 1/8 Daily BMP  Acute metabolic encephalopathy with intermittent hallucinations/agitation, history of schizoaffective disorder On multiple psych medsclozapine, benztropine, Thorazine, Lamictal, Lexapro at home. Patient was confused and more somnolent during his hospital course Psychiatry was consulted with multiple medication adjustments  Acute renal urinary retention Cr slowly rising, bladder scan Q shift, if retaining may re-insert foley Foley catheter discontinued on 11/15/2019  Drug reaction/fungal infection Currently on lower extremities, left flank, axilla, groin area Continue nystatin topical  QTC prolongation Repeat EKG 1/3 with QTc of 480's Keep potassium> 4, magnesium> 2  Chronic diastolicCHF, compensated Stable 2D echo in 09/2018 showed EF of 65 to 21% with LV diastolic dysfunction  Hyperlipidemia Continue Lipitor as tolerated  Hypothyroidism Continue levothyroxine  Obesity  Lifestyle modification advised  Pressure injury Stage II, left buttocks, present on admission Wound care as per nursing     DVT prophylaxis: Lovenox subq Code Status: Full Family Communication: None at bedside Disposition Plan: SNF  Consultants:   Psychiatry  Procedures:     Antimicrobials: Anti-infectives (From admission, onward)   Start     Dose/Rate Route Frequency Ordered Stop   11/16/19 1000  cefdinir (OMNICEF) capsule  300 mg     300 mg Oral Every 12 hours 11/16/19 0858 11/24/19 0959  11/14/19 2200  ceFEPIme (MAXIPIME) 2 g in sodium chloride 0.9 % 100 mL IVPB  Status:  Discontinued     2 g 200 mL/hr over 30 Minutes Intravenous Every 12 hours 11/14/19 1204 11/16/19 0858   11/10/19 1700  ceFEPIme (MAXIPIME) 2 g in sodium chloride 0.9 % 100 mL IVPB  Status:  Discontinued     2 g 200 mL/hr over 30 Minutes Intravenous Daily 11/10/19 1648 11/14/19 1204   10/26/19 1000  remdesivir 100 mg in sodium chloride 0.9 % 100 mL IVPB  Status:  Discontinued     100 mg 200 mL/hr over 30 Minutes Intravenous Daily 10/25/19 2229 10/25/19 2254   10/26/19 1000  remdesivir 100 mg in sodium chloride 0.9 % 100 mL IVPB     100 mg 200 mL/hr over 30 Minutes Intravenous Daily 10/25/19 2135 10/29/19 1045   10/25/19 2230  cefTRIAXone (ROCEPHIN) 1 g in sodium chloride 0.9 % 100 mL IVPB  Status:  Discontinued     1 g 200 mL/hr over 30 Minutes Intravenous Every 24 hours 10/25/19 2228 10/30/19 1746   10/25/19 2230  doxycycline (VIBRA-TABS) tablet 100 mg  Status:  Discontinued     100 mg Oral Every 12 hours 10/25/19 2229 10/30/19 1746   10/25/19 2228  remdesivir 200 mg in sodium chloride 0.9% 250 mL IVPB  Status:  Discontinued     200 mg 580 mL/hr over 30 Minutes Intravenous Once 10/25/19 2229 10/25/19 2254   10/25/19 2200  remdesivir 200 mg in sodium chloride 0.9% 250 mL IVPB     200 mg 580 mL/hr over 30 Minutes Intravenous Once 10/25/19 2135 10/26/19 0256      Subjective: Patient seen and examined at bedside.  Continues to yell periodically.  Looks comfortable, denies any new complaints.  Objective: Vitals:   11/22/19 2024 11/23/19 0539 11/23/19 0850 11/23/19 1314  BP: (!) 111/56 106/60  115/71  Pulse: 100 89  76  Resp: 19 20  18   Temp: 98.3 F (36.8 C) 97.6 F (36.4 C)  98.2 F (36.8 C)  TempSrc: Oral Oral  Oral  SpO2: 92% 90% 90% 93%  Weight:      Height:        Intake/Output Summary (Last 24 hours) at 11/23/2019  1708 Last data filed at 11/23/2019 1313 Gross per 24 hour  Intake 720 ml  Output --  Net 720 ml   Filed Weights   11/08/19 1324 11/15/19 0700 11/16/19 1100  Weight: 113 kg 113.3 kg 111.9 kg    Examination:  General: NAD, continues to yell periodically  Cardiovascular: S1, S2 present  Respiratory: CTAB  Abdomen: Soft, nontender, nondistended, bowel sounds present  Musculoskeletal: No bilateral pedal edema noted  Skin: Normal  Psychiatry: Unable to assess, poor insight   Data Reviewed: I have personally reviewed following labs and imaging studies  CBC: Recent Labs  Lab 11/19/19 0330 11/20/19 0343 11/21/19 0409 11/22/19 0343 11/23/19 0423  WBC 10.6* 10.6* 15.9* 12.3* 13.6*  NEUTROABS  --   --  12.1*  --  9.9*  HGB 10.4* 10.9* 10.7* 10.8* 10.6*  HCT 32.8* 34.4* 33.7* 34.3* 33.5*  MCV 98.8 98.3 97.4 98.6 98.5  PLT 187 195 210 213 751   Basic Metabolic Panel: Recent Labs  Lab 11/19/19 0330 11/20/19 0343 11/21/19 0409 11/22/19 0343 11/23/19 0423  NA 141 141 138 141 138  K 3.7 3.6 3.7 4.0 4.3  CL 104 103 102 104 101  CO2 30 29 28 30 29   GLUCOSE  156* 155* 157* 128* 127*  BUN 18 14 15 14  25*  CREATININE 1.63* 1.66* 1.75* 1.71* 2.00*  CALCIUM 9.6 9.7 9.4 9.7 9.2  MG  --  1.9  --   --   --    GFR: Estimated Creatinine Clearance: 46.9 mL/min (A) (by C-G formula based on SCr of 2 mg/dL (H)). Liver Function Tests: Recent Labs  Lab 11/20/19 0343 11/21/19 0409 11/22/19 0343  AST 16 20 19   ALT 20 20 20   ALKPHOS 69 75 77  BILITOT 0.5 0.4 0.4  PROT 5.9* 5.6* 5.6*  ALBUMIN 3.0* 2.9* 2.9*   No results for input(s): LIPASE, AMYLASE in the last 168 hours. No results for input(s): AMMONIA in the last 168 hours. Coagulation Profile: No results for input(s): INR, PROTIME in the last 168 hours. Cardiac Enzymes: No results for input(s): CKTOTAL, CKMB, CKMBINDEX, TROPONINI in the last 168 hours. BNP (last 3 results) No results for input(s): PROBNP in the last  8760 hours. HbA1C: No results for input(s): HGBA1C in the last 72 hours. CBG: No results for input(s): GLUCAP in the last 168 hours. Lipid Profile: No results for input(s): CHOL, HDL, LDLCALC, TRIG, CHOLHDL, LDLDIRECT in the last 72 hours. Thyroid Function Tests: No results for input(s): TSH, T4TOTAL, FREET4, T3FREE, THYROIDAB in the last 72 hours. Anemia Panel: No results for input(s): VITAMINB12, FOLATE, FERRITIN, TIBC, IRON, RETICCTPCT in the last 72 hours. Sepsis Labs: No results for input(s): PROCALCITON, LATICACIDVEN in the last 168 hours.  No results found for this or any previous visit (from the past 240 hour(s)).   Radiology Studies: No results found.  Scheduled Meds: . aspirin EC  81 mg Oral Daily  . atorvastatin  20 mg Oral q1800  . benztropine  0.5 mg Oral BID  . cefdinir  300 mg Oral Q12H  . cholecalciferol  5,000 Units Oral Daily  . clonazePAM  1 mg Oral QHS  . clotrimazole   Topical BID  . cloZAPine  150 mg Oral BID  . docusate sodium  100 mg Oral Daily  . enoxaparin (LOVENOX) injection  40 mg Subcutaneous QHS  . escitalopram  10 mg Oral QAC breakfast  . feeding supplement (ENSURE ENLIVE)  237 mL Oral BID BM  . fluticasone  1 puff Inhalation BID  . folic acid  1 mg Oral Daily  . Ipratropium-Albuterol  1 puff Inhalation BID  . levothyroxine  100 mcg Oral Q0600  . mouth rinse  15 mL Mouth Rinse BID  . multivitamin with minerals  1 tablet Oral Daily  . nystatin  1 Bottle Topical Daily  . polyethylene glycol  17 g Oral Daily  . QUEtiapine  12.5 mg Oral BID  . QUEtiapine  25 mg Oral QHS  . senna  1 tablet Oral Daily  . sodium chloride flush  10-40 mL Intracatheter Q12H  . thiamine  100 mg Oral Daily  . vitamin C  500 mg Oral Daily  . zinc sulfate  220 mg Oral Daily   Continuous Infusions:    LOS: 29 days   Alma Friendly, MD [facility] Pager On Amion  If 7PM-7AM, please contact night-coverage 11/23/2019, 5:08 PM

## 2019-11-24 LAB — BASIC METABOLIC PANEL
Anion gap: 9 (ref 5–15)
BUN: 25 mg/dL — ABNORMAL HIGH (ref 8–23)
CO2: 27 mmol/L (ref 22–32)
Calcium: 9.6 mg/dL (ref 8.9–10.3)
Chloride: 105 mmol/L (ref 98–111)
Creatinine, Ser: 1.93 mg/dL — ABNORMAL HIGH (ref 0.61–1.24)
GFR calc Af Amer: 41 mL/min — ABNORMAL LOW (ref >60)
GFR calc non Af Amer: 35 mL/min — ABNORMAL LOW (ref >60)
Glucose, Bld: 123 mg/dL — ABNORMAL HIGH (ref 70–99)
Potassium: 4.2 mmol/L (ref 3.5–5.1)
Sodium: 141 mmol/L (ref 135–145)

## 2019-11-24 LAB — CBC WITH DIFFERENTIAL/PLATELET
Abs Immature Granulocytes: 0.09 10*3/uL — ABNORMAL HIGH (ref 0.00–0.07)
Basophils Absolute: 0.1 10*3/uL (ref 0.0–0.1)
Basophils Relative: 0 %
Eosinophils Absolute: 0.3 10*3/uL (ref 0.0–0.5)
Eosinophils Relative: 2 %
HCT: 35.2 % — ABNORMAL LOW (ref 39.0–52.0)
Hemoglobin: 10.8 g/dL — ABNORMAL LOW (ref 13.0–17.0)
Immature Granulocytes: 1 %
Lymphocytes Relative: 16 %
Lymphs Abs: 2.2 10*3/uL (ref 0.7–4.0)
MCH: 31.2 pg (ref 26.0–34.0)
MCHC: 30.7 g/dL (ref 30.0–36.0)
MCV: 101.7 fL — ABNORMAL HIGH (ref 80.0–100.0)
Monocytes Absolute: 0.8 10*3/uL (ref 0.1–1.0)
Monocytes Relative: 5 %
Neutro Abs: 10.5 10*3/uL — ABNORMAL HIGH (ref 1.7–7.7)
Neutrophils Relative %: 76 %
Platelets: 237 10*3/uL (ref 150–400)
RBC: 3.46 MIL/uL — ABNORMAL LOW (ref 4.22–5.81)
RDW: 15.6 % — ABNORMAL HIGH (ref 11.5–15.5)
WBC: 13.8 10*3/uL — ABNORMAL HIGH (ref 4.0–10.5)
nRBC: 0 % (ref 0.0–0.2)

## 2019-11-24 LAB — SARS CORONAVIRUS 2 (TAT 6-24 HRS): SARS Coronavirus 2: NEGATIVE

## 2019-11-24 NOTE — Progress Notes (Signed)
PROGRESS NOTE    [name]  OVF:[id number] DOB: 1951-07-18 DOA: 10/25/2019 PCP: [name], FNP    Brief Narrative:  69 year old male, obese, with PMH significant for paranoid schizophrenia, prolonged QT interval, non-Hodgkin's lymphoma, hypertension, hyperlipidemia, COPD, diastolic congestive heart failure and chronic kidney disease. Patient lives in a group home and on12/9 patient was sent to the ED for worsening shortness of breath, fever chills and low O2 sat at 89%. In the ED, patient was started on O2 by nasal cannula. Work-up showed elevated creatinine to 3.09 (up from 1.67). COVID-19 PCR was positive. Chest x-ray revealed multifocal pneumonia. Patient was admitted to hospital service for further evaluation and management. Hospital course was complicated by AKI, acute urinary retention.  Patient completed a course of Decadron, IV remdesivir.  He continues to be encephalopathic with intermittent agitation and drowsiness, currently awaiting SNF placement.  Assessment & Plan:   Principal Problem:   Schizophrenia ([place]) Active Problems:   Sepsis secondary to UTI ([place])   COVID-19 virus infection   Obstructive uropathy   Prostatitis   Acute Hypoxic Respiratory Failure due to Covid 19 Viral Pneumonia- POA Improved, currently on room air, occasionally needs supplemental oxygen CT abdomen/pelvis on 11/10/2019 with nodular appearance right lung base with recommendations of surveillance and follow-up noncontrast CT in 6-8 weeks Patient completed a 5-day course of remdesivir on 10/29/2019 and a 10-day course of Decadron on 11/03/2019 Continue supplemental oxygen prn Discontinued contact/airborne isolation precautions 11/15/2019; as this is over 21 days from his positive Covid test on 10/25/2019  Acute kidney injury on CKD stage IV Sepsis with shock likely 2/2 UTI versus prostatitis Cr noted to be slowly rising, will bladder scan Q shift Presenting with a creatinine of 3.09,  which is up from his baseline of 1.67.  Renal ultrasound unremarkable with no hydronephrosis or obstruction.  On 11/10/2019, patient with fever and white blood cell count up to 47.2 with a procalcitonin of 22.18.  CT abdomen/pelvis notable for a malpositioned Foley catheter with potential cystitis, a sending urinary tract infection versus prostatitis. Foley catheter removed on 11/15/2019 Urine culture with no significant growth Blood cultures x2: No growth thus far Transitioned cefepime to cefdinir 300mg  BID, plan 14d total course with stop date 1/8 Daily BMP  Acute metabolic encephalopathy with intermittent hallucinations/agitation, history of schizoaffective disorder On multiple psych medsclozapine, benztropine, Thorazine, Lamictal, Lexapro at home. Patient was confused and more somnolent during his hospital course Psychiatry was consulted with multiple medication adjustments  Acute renal urinary retention Cr slowly rising, bladder scan Q shift, if retaining may re-insert foley Foley catheter discontinued on 11/15/2019  Drug reaction/fungal infection Currently on lower extremities, left flank, axilla, groin area Continue nystatin topical  QTC prolongation Repeat EKG 1/3 with QTc of 480's Keep potassium> 4, magnesium> 2  Chronic diastolicCHF, compensated Stable 2D echo in 09/2018 showed EF of 65 to 84% with LV diastolic dysfunction  Hyperlipidemia Continue Lipitor as tolerated  Hypothyroidism Continue levothyroxine  Obesity  Lifestyle modification advised  Pressure injury Stage II, left buttocks, present on admission Wound care as per nursing     DVT prophylaxis: Lovenox subq Code Status: Full Family Communication: None at bedside Disposition Plan: SNF, awaiting bed placement  Consultants:   Psychiatry  Procedures:   None  Antimicrobials: Anti-infectives (From admission, onward)   Start     Dose/Rate Route Frequency Ordered Stop   11/16/19 1000   cefdinir (OMNICEF) capsule 300 mg     300 mg Oral Every 12 hours 11/16/19  6962 11/23/19 2129   11/14/19 2200  ceFEPIme (MAXIPIME) 2 g in sodium chloride 0.9 % 100 mL IVPB  Status:  Discontinued     2 g 200 mL/hr over 30 Minutes Intravenous Every 12 hours 11/14/19 1204 11/16/19 0858   11/10/19 1700  ceFEPIme (MAXIPIME) 2 g in sodium chloride 0.9 % 100 mL IVPB  Status:  Discontinued     2 g 200 mL/hr over 30 Minutes Intravenous Daily 11/10/19 1648 11/14/19 1204   10/26/19 1000  remdesivir 100 mg in sodium chloride 0.9 % 100 mL IVPB  Status:  Discontinued     100 mg 200 mL/hr over 30 Minutes Intravenous Daily 10/25/19 2229 10/25/19 2254   10/26/19 1000  remdesivir 100 mg in sodium chloride 0.9 % 100 mL IVPB     100 mg 200 mL/hr over 30 Minutes Intravenous Daily 10/25/19 2135 10/29/19 1045   10/25/19 2230  cefTRIAXone (ROCEPHIN) 1 g in sodium chloride 0.9 % 100 mL IVPB  Status:  Discontinued     1 g 200 mL/hr over 30 Minutes Intravenous Every 24 hours 10/25/19 2228 10/30/19 1746   10/25/19 2230  doxycycline (VIBRA-TABS) tablet 100 mg  Status:  Discontinued     100 mg Oral Every 12 hours 10/25/19 2229 10/30/19 1746   10/25/19 2228  remdesivir 200 mg in sodium chloride 0.9% 250 mL IVPB  Status:  Discontinued     200 mg 580 mL/hr over 30 Minutes Intravenous Once 10/25/19 2229 10/25/19 2254   10/25/19 2200  remdesivir 200 mg in sodium chloride 0.9% 250 mL IVPB     200 mg 580 mL/hr over 30 Minutes Intravenous Once 10/25/19 2135 10/26/19 0256      Subjective: Patient seen and examined at bedside.  Denies any new complaints, although noted to be talkative, mostly off tangent.  Objective: Vitals:   11/23/19 2128 11/24/19 0556 11/24/19 0742 11/24/19 1420  BP: 128/65 (!) 107/57  (!) 103/45  Pulse: (!) 102 84  (!) 104  Resp: 18 18  16   Temp: 98 F (36.7 C) 97.7 F (36.5 C)  98.3 F (36.8 C)  TempSrc: Oral Oral  Oral  SpO2: 97% 92% 92% 100%  Weight:      Height:        Intake/Output  Summary (Last 24 hours) at 11/24/2019 1631 Last data filed at 11/24/2019 1551 Gross per 24 hour  Intake 920 ml  Output 0 ml  Net 920 ml   Filed Weights   11/08/19 1324 11/15/19 0700 11/16/19 1100  Weight: 113 kg 113.3 kg 111.9 kg    Examination:  General: NAD, talkative (off tangent)  Cardiovascular: S1, S2 present  Respiratory:  Diminished breath sounds bilaterally  Abdomen: Soft, nontender, nondistended, bowel sounds present  Musculoskeletal: No bilateral pedal edema noted  Skin: Normal  Psychiatry:  Poor insight   Data Reviewed: I have personally reviewed following labs and imaging studies  CBC: Recent Labs  Lab 11/20/19 0343 11/21/19 0409 11/22/19 0343 11/23/19 0423 11/24/19 0538  WBC 10.6* 15.9* 12.3* 13.6* 13.8*  NEUTROABS  --  12.1*  --  9.9* 10.5*  HGB 10.9* 10.7* 10.8* 10.6* 10.8*  HCT 34.4* 33.7* 34.3* 33.5* 35.2*  MCV 98.3 97.4 98.6 98.5 101.7*  PLT 195 210 213 241 952   Basic Metabolic Panel: Recent Labs  Lab 11/20/19 0343 11/21/19 0409 11/22/19 0343 11/23/19 0423 11/24/19 0538  NA 141 138 141 138 141  K 3.6 3.7 4.0 4.3 4.2  CL 103 102 104 101 105  CO2 29 28 30 29 27   GLUCOSE 155* 157* 128* 127* 123*  BUN 14 15 14  25* 25*  CREATININE 1.66* 1.75* 1.71* 2.00* 1.93*  CALCIUM 9.7 9.4 9.7 9.2 9.6  MG 1.9  --   --   --   --    GFR: Estimated Creatinine Clearance: 48.6 mL/min (A) (by C-G formula based on SCr of 1.93 mg/dL (H)). Liver Function Tests: Recent Labs  Lab 11/20/19 0343 11/21/19 0409 11/22/19 0343  AST 16 20 19   ALT 20 20 20   ALKPHOS 69 75 77  BILITOT 0.5 0.4 0.4  PROT 5.9* 5.6* 5.6*  ALBUMIN 3.0* 2.9* 2.9*   No results for input(s): LIPASE, AMYLASE in the last 168 hours. No results for input(s): AMMONIA in the last 168 hours. Coagulation Profile: No results for input(s): INR, PROTIME in the last 168 hours. Cardiac Enzymes: No results for input(s): CKTOTAL, CKMB, CKMBINDEX, TROPONINI in the last 168 hours. BNP (last 3  results) No results for input(s): PROBNP in the last 8760 hours. HbA1C: No results for input(s): HGBA1C in the last 72 hours. CBG: No results for input(s): GLUCAP in the last 168 hours. Lipid Profile: No results for input(s): CHOL, HDL, LDLCALC, TRIG, CHOLHDL, LDLDIRECT in the last 72 hours. Thyroid Function Tests: No results for input(s): TSH, T4TOTAL, FREET4, T3FREE, THYROIDAB in the last 72 hours. Anemia Panel: No results for input(s): VITAMINB12, FOLATE, FERRITIN, TIBC, IRON, RETICCTPCT in the last 72 hours. Sepsis Labs: No results for input(s): PROCALCITON, LATICACIDVEN in the last 168 hours.  No results found for this or any previous visit (from the past 240 hour(s)).   Radiology Studies: No results found.  Scheduled Meds: . aspirin EC  81 mg Oral Daily  . atorvastatin  20 mg Oral q1800  . benztropine  0.5 mg Oral BID  . cholecalciferol  5,000 Units Oral Daily  . clonazePAM  1 mg Oral QHS  . clotrimazole   Topical BID  . cloZAPine  150 mg Oral BID  . docusate sodium  100 mg Oral Daily  . enoxaparin (LOVENOX) injection  40 mg Subcutaneous QHS  . escitalopram  10 mg Oral QAC breakfast  . feeding supplement (ENSURE ENLIVE)  237 mL Oral BID BM  . fluticasone  1 puff Inhalation BID  . folic acid  1 mg Oral Daily  . levothyroxine  100 mcg Oral Q0600  . mouth rinse  15 mL Mouth Rinse BID  . multivitamin with minerals  1 tablet Oral Daily  . nystatin  1 Bottle Topical Daily  . polyethylene glycol  17 g Oral Daily  . QUEtiapine  12.5 mg Oral BID  . QUEtiapine  25 mg Oral QHS  . senna  1 tablet Oral Daily  . sodium chloride flush  10-40 mL Intracatheter Q12H  . thiamine  100 mg Oral Daily  . vitamin C  500 mg Oral Daily  . zinc sulfate  220 mg Oral Daily   Continuous Infusions:    LOS: 30 days   Alma Friendly, MD [facility] Pager On Amion  If 7PM-7AM, please contact night-coverage 11/24/2019, 4:31 PM

## 2019-11-24 NOTE — TOC Progression Note (Addendum)
Transition of Care [facility]) - Progression Note    Patient Details  Name: ANAN DAPOLITO MRN: [id number] Date of Birth: 1951-11-01  Transition of [facility]) CM/SW Contact  Leeroy Cha, RN Phone Number: 11/24/2019, 12:01 PM  Clinical Narrative:    Damaris Schooner with [name] at [facility] no bed this week will look to see about patient coming next week. Per [name] at [facility] can not take covid + patients , will have test redone since it has been several weeks. Message sent to md.       Expected Discharge Plan and Services                                                 Social Determinants of Health (SDOH) Interventions    Readmission Risk Interventions Readmission Risk Prevention Plan 07/20/2018  Transportation Screening Complete  PCP or Specialist Appt within 3-5 Days Complete  Home Care Screening Complete  HRI or Home Care Consult Complete  Social Work Consult for [place] Planning/Counseling Complete  Palliative Care Screening Complete  Medication Review [employer]) Complete  Some recent data might be hidden

## 2019-11-25 LAB — BASIC METABOLIC PANEL
Anion gap: 7 (ref 5–15)
BUN: 28 mg/dL — ABNORMAL HIGH (ref 8–23)
CO2: 29 mmol/L (ref 22–32)
Calcium: 9.8 mg/dL (ref 8.9–10.3)
Chloride: 104 mmol/L (ref 98–111)
Creatinine, Ser: 1.85 mg/dL — ABNORMAL HIGH (ref 0.61–1.24)
GFR calc Af Amer: 43 mL/min — ABNORMAL LOW (ref >60)
GFR calc non Af Amer: 37 mL/min — ABNORMAL LOW (ref >60)
Glucose, Bld: 144 mg/dL — ABNORMAL HIGH (ref 70–99)
Potassium: 4 mmol/L (ref 3.5–5.1)
Sodium: 140 mmol/L (ref 135–145)

## 2019-11-25 LAB — CBC WITH DIFFERENTIAL/PLATELET
Abs Immature Granulocytes: 0.1 10*3/uL — ABNORMAL HIGH (ref 0.00–0.07)
Basophils Absolute: 0.1 10*3/uL (ref 0.0–0.1)
Basophils Relative: 1 %
Eosinophils Absolute: 0.3 10*3/uL (ref 0.0–0.5)
Eosinophils Relative: 2 %
HCT: 33.8 % — ABNORMAL LOW (ref 39.0–52.0)
Hemoglobin: 10.5 g/dL — ABNORMAL LOW (ref 13.0–17.0)
Immature Granulocytes: 1 %
Lymphocytes Relative: 16 %
Lymphs Abs: 2.1 10*3/uL (ref 0.7–4.0)
MCH: 30.9 pg (ref 26.0–34.0)
MCHC: 31.1 g/dL (ref 30.0–36.0)
MCV: 99.4 fL (ref 80.0–100.0)
Monocytes Absolute: 0.8 10*3/uL (ref 0.1–1.0)
Monocytes Relative: 6 %
Neutro Abs: 9.8 10*3/uL — ABNORMAL HIGH (ref 1.7–7.7)
Neutrophils Relative %: 74 %
Platelets: 247 10*3/uL (ref 150–400)
RBC: 3.4 MIL/uL — ABNORMAL LOW (ref 4.22–5.81)
RDW: 15.6 % — ABNORMAL HIGH (ref 11.5–15.5)
WBC: 13.2 10*3/uL — ABNORMAL HIGH (ref 4.0–10.5)
nRBC: 0 % (ref 0.0–0.2)

## 2019-11-25 NOTE — Progress Notes (Signed)
Physical Therapy Treatment Patient Details Name: [name] MRN: [id number] DOB: [birth year] Today's Date: 11/25/2019    History of Present Illness 69 yo male admitted with COVID (+). Hx of schizoaffective d/o, NHL, CKD, obesity, CHF, COPD. Pt was admitted from a group home.    PT Comments    Pt progressing well with PT. He is participatory and cooperative with therapy. decr assist needed for bed mobility and transfers today. Continue PT in acute setting   Follow Up Recommendations  SNF     Equipment Recommendations  None recommended by PT    Recommendations for Other Services       Precautions / Restrictions Precautions Precautions: Fall Precaution Comments: Schizophrenia resides in a Group Home Restrictions Weight Bearing Restrictions: No    Mobility  Bed Mobility Overal bed mobility: Needs Assistance Bed Mobility: Supine to Sit;Sit to Supine     Supine to sit: Min guard;Supervision Sit to supine: Min guard;Supervision   General bed mobility comments: for safety, repeated x2 as pt put himself back to bed after initial sitting.  pt cooperative when asked to sit up again   Transfers Overall transfer level: Needs assistance Equipment used: Rolling walker (2 wheeled) Transfers: Sit to/from Stand Sit to Stand: Min guard;+2 safety/equipment         General transfer comment: sit to stand trials repeated x5 for strengthening and activity tolerance. +2 assist   for overall safety.  multi-modal cues for task completion and safety   Ambulation/Gait             General Gait Details: +2 min assist for lateral wt shifting/ lateral steps along EOB/pre gait activities    Stairs             Wheelchair Mobility    Modified Rankin (Stroke Patients Only)       Balance                                            Cognition Arousal/Alertness: Awake/alert Behavior During Therapy: Flat affect Overall Cognitive Status: History of  cognitive impairments - at baseline Area of Impairment: Attention;Safety/judgement;Problem solving;Following commands                   Current Attention Level: Sustained   Following Commands: Follows one step commands with increased time;Follows multi-step commands inconsistently Safety/Judgement: Decreased awareness of safety;Decreased awareness of deficits   Problem Solving: Difficulty sequencing;Requires verbal cues;Requires tactile cues;Slow processing General Comments: pt is cooperative and follows most basic functional commands with consistency.  pt repeats he is "sick", " miserable" and that "there is an emergency" which I anticipate is his baseline      Exercises      General Comments        Pertinent Vitals/Pain Pain Assessment: Faces Faces Pain Scale: No hurt    Home Living                      Prior Function            PT Goals (current goals can now be found in the care plan section) Acute Rehab PT Goals Patient Stated Goal: unable to state PT Goal Formulation: With patient Time For Goal Achievement: 12/06/19 Potential to Achieve Goals: Fair Progress towards PT goals: Progressing toward goals    Frequency    Min 2X/week  PT Plan Current plan remains appropriate    Co-evaluation              AM-PAC PT "6 Clicks" Mobility   Outcome Measure  Help needed turning from your back to your side while in a flat bed without using bedrails?: A Little Help needed moving from lying on your back to sitting on the side of a flat bed without using bedrails?: A Little Help needed moving to and from a bed to a chair (including a wheelchair)?: A Little Help needed standing up from a chair using your arms (e.g., wheelchair or bedside chair)?: A Little Help needed to walk in hospital room?: A Lot Help needed climbing 3-5 steps with a railing? : Total 6 Click Score: 15    End of Session Equipment Utilized During Treatment: Gait  belt Activity Tolerance: Patient tolerated treatment well Patient left: in bed;with call bell/phone within reach;with restraints reapplied;with bed alarm set;with nursing/sitter in room Nurse Communication: Mobility status PT Visit Diagnosis: Unsteadiness on feet (R26.81);Difficulty in walking, not elsewhere classified (R26.2);Muscle weakness (generalized) (M62.81)     Time: 8138-8719 PT Time Calculation (min) (ACUTE ONLY): 15 min  Charges:  $Therapeutic Activity: 8-22 mins                     Baxter Flattery, PT   Acute Rehab Dept [facility]): [phone]   11/25/2019    [facility] 11/25/2019, 11:38 AM

## 2019-11-25 NOTE — Progress Notes (Signed)
PROGRESS NOTE    [name]  INO:[id number] DOB: 1951/04/21 DOA: 10/25/2019 PCP: [name], FNP    Brief Narrative:  69 year old male, obese, with PMH significant for paranoid schizophrenia, prolonged QT interval, non-Hodgkin's lymphoma, hypertension, hyperlipidemia, COPD, diastolic congestive heart failure and chronic kidney disease. Patient lives in a group home and on12/9 patient was sent to the ED for worsening shortness of breath, fever chills and low O2 sat at 89%. In the ED, patient was started on O2 by nasal cannula. Work-up showed elevated creatinine to 3.09 (up from 1.67). COVID-19 PCR was positive. Chest x-ray revealed multifocal pneumonia. Patient was admitted to hospital service for further evaluation and management. Hospital course was complicated by AKI, acute urinary retention.  Patient completed a course of Decadron, IV remdesivir.  He continues to be encephalopathic with intermittent agitation and drowsiness, currently awaiting SNF placement.  Assessment & Plan:   Principal Problem:   Schizophrenia ([place]) Active Problems:   Sepsis secondary to UTI ([place])   COVID-19 virus infection   Obstructive uropathy   Prostatitis   Acute Hypoxic Respiratory Failure due to Covid 19 Viral Pneumonia- POA Improved, currently on room air, occasionally needs supplemental oxygen CT abdomen/pelvis on 11/10/2019 with nodular appearance right lung base with recommendations of surveillance and follow-up noncontrast CT in 6-8 weeks Patient completed a 5-day course of remdesivir on 10/29/2019 and a 10-day course of Decadron on 11/03/2019 Continue supplemental oxygen prn Discontinued contact/airborne isolation precautions 11/15/2019; as this is over 21 days from his positive Covid test on 10/25/2019  Acute kidney injury on CKD stage IV Sepsis with shock likely 2/2 UTI versus prostatitis Bladder scan Q shift Presenting with a creatinine of 3.09, which is up from his baseline of  1.67 Renal ultrasound unremarkable with no hydronephrosis or obstruction On 11/10/2019, patient with fever and white blood cell count up to 47.2 with a procalcitonin of 22.18 CT abdomen/pelvis notable for a malpositioned Foley catheter with potential cystitis, a sending urinary tract infection versus prostatitis. Foley catheter removed on 11/15/2019 Urine culture with no significant growth Blood cultures x2: No growth thus far Completed 14-day course of antibiotics on 11/24/2019 (cefepime to cefdinir) Daily BMP  Acute metabolic encephalopathy with intermittent hallucinations/agitation, history of schizoaffective disorder On multiple psych medsclozapine, benztropine, Thorazine, Lamictal, Lexapro at home. Patient was confused and more somnolent during his hospital course Psychiatry was consulted with multiple medication adjustments  Acute renal urinary retention Cr fluctuating, bladder scan Q shift, if retaining may re-insert foley Foley catheter discontinued on 11/15/2019  Drug reaction/fungal infection Currently on lower extremities, left flank, axilla, groin area Continue nystatin topical  QTC prolongation Repeat EKG 1/7 with QTc of 479 Keep potassium> 4, magnesium> 2  Chronic diastolicCHF, compensated Stable 2D echo in 09/2018 showed EF of 65 to 09% with LV diastolic dysfunction  Hyperlipidemia Continue Lipitor as tolerated  Hypothyroidism Continue levothyroxine  Obesity  Lifestyle modification advised  Pressure injury Stage II, left buttocks, present on admission Wound care as per nursing     DVT prophylaxis: Lovenox subq Code Status: Full Family Communication: None at bedside Disposition Plan: SNF, medically stable for discharge, awaiting bed placement  Consultants:   Psychiatry  Procedures:   None  Antimicrobials: Anti-infectives (From admission, onward)   Start     Dose/Rate Route Frequency Ordered Stop   11/16/19 1000  cefdinir (OMNICEF)  capsule 300 mg     300 mg Oral Every 12 hours 11/16/19 0858 11/23/19 2129   11/14/19 2200  ceFEPIme (MAXIPIME) 2  g in sodium chloride 0.9 % 100 mL IVPB  Status:  Discontinued     2 g 200 mL/hr over 30 Minutes Intravenous Every 12 hours 11/14/19 1204 11/16/19 0858   11/10/19 1700  ceFEPIme (MAXIPIME) 2 g in sodium chloride 0.9 % 100 mL IVPB  Status:  Discontinued     2 g 200 mL/hr over 30 Minutes Intravenous Daily 11/10/19 1648 11/14/19 1204   10/26/19 1000  remdesivir 100 mg in sodium chloride 0.9 % 100 mL IVPB  Status:  Discontinued     100 mg 200 mL/hr over 30 Minutes Intravenous Daily 10/25/19 2229 10/25/19 2254   10/26/19 1000  remdesivir 100 mg in sodium chloride 0.9 % 100 mL IVPB     100 mg 200 mL/hr over 30 Minutes Intravenous Daily 10/25/19 2135 10/29/19 1045   10/25/19 2230  cefTRIAXone (ROCEPHIN) 1 g in sodium chloride 0.9 % 100 mL IVPB  Status:  Discontinued     1 g 200 mL/hr over 30 Minutes Intravenous Every 24 hours 10/25/19 2228 10/30/19 1746   10/25/19 2230  doxycycline (VIBRA-TABS) tablet 100 mg  Status:  Discontinued     100 mg Oral Every 12 hours 10/25/19 2229 10/30/19 1746   10/25/19 2228  remdesivir 200 mg in sodium chloride 0.9% 250 mL IVPB  Status:  Discontinued     200 mg 580 mL/hr over 30 Minutes Intravenous Once 10/25/19 2229 10/25/19 2254   10/25/19 2200  remdesivir 200 mg in sodium chloride 0.9% 250 mL IVPB     200 mg 580 mL/hr over 30 Minutes Intravenous Once 10/25/19 2135 10/26/19 0256      Subjective: Patient seen and examined at bedside.  Resting comfortably.  Denies any new complaints.  Objective: Vitals:   11/24/19 1420 11/24/19 1935 11/24/19 2036 11/25/19 0500  BP: (!) 103/45  122/64 (!) 115/59  Pulse: (!) 104  96 89  Resp: 16  16 16   Temp: 98.3 F (36.8 C)  98.2 F (36.8 C) 98.4 F (36.9 C)  TempSrc: Oral  Oral Oral  SpO2: 100% 96% 97% 95%  Weight:      Height:        Intake/Output Summary (Last 24 hours) at 11/25/2019 1507 Last data  filed at 11/25/2019 1424 Gross per 24 hour  Intake 760 ml  Output 550 ml  Net 210 ml   Filed Weights   11/08/19 1324 11/15/19 0700 11/16/19 1100  Weight: 113 kg 113.3 kg 111.9 kg    Examination:  General: NAD, resting comfortably  Cardiovascular: S1, S2 present  Respiratory:  Diminished breath sounds bilaterally  Abdomen: Soft, nontender, nondistended, bowel sounds present  Musculoskeletal: No bilateral pedal edema noted  Skin: Normal  Psychiatry:  Poor insight   Data Reviewed: I have personally reviewed following labs and imaging studies  CBC: Recent Labs  Lab 11/21/19 0409 11/22/19 0343 11/23/19 0423 11/24/19 0538 11/25/19 0439  WBC 15.9* 12.3* 13.6* 13.8* 13.2*  NEUTROABS 12.1*  --  9.9* 10.5* 9.8*  HGB 10.7* 10.8* 10.6* 10.8* 10.5*  HCT 33.7* 34.3* 33.5* 35.2* 33.8*  MCV 97.4 98.6 98.5 101.7* 99.4  PLT 210 213 241 237 629   Basic Metabolic Panel: Recent Labs  Lab 11/20/19 0343 11/21/19 0409 11/22/19 0343 11/23/19 0423 11/24/19 0538 11/25/19 0439  NA 141 138 141 138 141 140  K 3.6 3.7 4.0 4.3 4.2 4.0  CL 103 102 104 101 105 104  CO2 29 28 30 29 27 29   GLUCOSE 155* 157* 128* 127* 123*  144*  BUN 14 15 14  25* 25* 28*  CREATININE 1.66* 1.75* 1.71* 2.00* 1.93* 1.85*  CALCIUM 9.7 9.4 9.7 9.2 9.6 9.8  MG 1.9  --   --   --   --   --    GFR: Estimated Creatinine Clearance: 50.7 mL/min (A) (by C-G formula based on SCr of 1.85 mg/dL (H)). Liver Function Tests: Recent Labs  Lab 11/20/19 0343 11/21/19 0409 11/22/19 0343  AST 16 20 19   ALT 20 20 20   ALKPHOS 69 75 77  BILITOT 0.5 0.4 0.4  PROT 5.9* 5.6* 5.6*  ALBUMIN 3.0* 2.9* 2.9*   No results for input(s): LIPASE, AMYLASE in the last 168 hours. No results for input(s): AMMONIA in the last 168 hours. Coagulation Profile: No results for input(s): INR, PROTIME in the last 168 hours. Cardiac Enzymes: No results for input(s): CKTOTAL, CKMB, CKMBINDEX, TROPONINI in the last 168 hours. BNP (last 3  results) No results for input(s): PROBNP in the last 8760 hours. HbA1C: No results for input(s): HGBA1C in the last 72 hours. CBG: No results for input(s): GLUCAP in the last 168 hours. Lipid Profile: No results for input(s): CHOL, HDL, LDLCALC, TRIG, CHOLHDL, LDLDIRECT in the last 72 hours. Thyroid Function Tests: No results for input(s): TSH, T4TOTAL, FREET4, T3FREE, THYROIDAB in the last 72 hours. Anemia Panel: No results for input(s): VITAMINB12, FOLATE, FERRITIN, TIBC, IRON, RETICCTPCT in the last 72 hours. Sepsis Labs: No results for input(s): PROCALCITON, LATICACIDVEN in the last 168 hours.  Recent Results (from the past 240 hour(s))  SARS CORONAVIRUS 2 (TAT 6-24 HRS) Nasopharyngeal Nasopharyngeal Swab     Status: None   Collection Time: 11/24/19 12:16 PM   Specimen: Nasopharyngeal Swab  Result Value Ref Range Status   SARS Coronavirus 2 NEGATIVE NEGATIVE Final    Comment: (NOTE) SARS-CoV-2 target nucleic acids are NOT DETECTED. The SARS-CoV-2 RNA is generally detectable in upper and lower respiratory specimens during the acute phase of infection. Negative results do not preclude SARS-CoV-2 infection, do not rule out co-infections with other pathogens, and should not be used as the sole basis for treatment or other patient management decisions. Negative results must be combined with clinical observations, patient history, and epidemiological information. The expected result is Negative. Fact Sheet for Patients: SugarRoll.be Fact Sheet for Healthcare Providers: https://www.woods-mathews.com/ This test is not yet approved or cleared by the [place] FDA and  has been authorized for detection and/or diagnosis of SARS-CoV-2 by FDA under an Emergency Use Authorization (EUA). This EUA will remain  in effect (meaning this test can be used) for the duration of the COVID-19 declaration under Section 56 4(b)(1) of the Act, 21  U.S.C. section 360bbb-3(b)(1), unless the authorization is terminated or revoked sooner. Performed at [facility], [place] [address], [place], [place] [address]      Radiology Studies: No results found.  Scheduled Meds: . aspirin EC  81 mg Oral Daily  . atorvastatin  20 mg Oral q1800  . benztropine  0.5 mg Oral BID  . cholecalciferol  5,000 Units Oral Daily  . clonazePAM  1 mg Oral QHS  . clotrimazole   Topical BID  . cloZAPine  150 mg Oral BID  . docusate sodium  100 mg Oral Daily  . enoxaparin (LOVENOX) injection  40 mg Subcutaneous QHS  . escitalopram  10 mg Oral QAC breakfast  . feeding supplement (ENSURE ENLIVE)  237 mL Oral BID BM  . fluticasone  1 puff Inhalation BID  .  folic acid  1 mg Oral Daily  . levothyroxine  100 mcg Oral Q0600  . mouth rinse  15 mL Mouth Rinse BID  . multivitamin with minerals  1 tablet Oral Daily  . nystatin  1 Bottle Topical Daily  . polyethylene glycol  17 g Oral Daily  . QUEtiapine  12.5 mg Oral BID  . QUEtiapine  25 mg Oral QHS  . senna  1 tablet Oral Daily  . sodium chloride flush  10-40 mL Intracatheter Q12H  . thiamine  100 mg Oral Daily  . vitamin C  500 mg Oral Daily  . zinc sulfate  220 mg Oral Daily   Continuous Infusions:    LOS: 31 days   Alma Friendly, MD [facility] Pager On Amion  If 7PM-7AM, please contact night-coverage 11/25/2019, 3:07 PM

## 2019-11-26 MED ORDER — LORAZEPAM 2 MG/ML IJ SOLN
0.5000 mg | Freq: Once | INTRAMUSCULAR | Status: AC
  Administered 2019-11-26: 0.5 mg via INTRAVENOUS

## 2019-11-26 NOTE — Progress Notes (Signed)
CSW spoke with [name] regarding bed offer. Juliann Pulse is reviewing referral and will follow back up with CSW. Juliann Pulse reported that the earliest day for admission and transfer would be 11/27/19.   [place] Transitions of Care  Clinical Social Worker  Ph: [phone]

## 2019-11-26 NOTE — TOC Progression Note (Signed)
Transition of Care [facility]) - Progression Note    Patient Details  Name: [name] MRN: [id number] Date of Birth: 10-10-1951  Transition of Care [facility]) CM/SW [place], LCSW Phone Number: 11/26/2019, 4:09 PM  Clinical Narrative:   CSW in contact with [name] from [facility]. Juliann Pulse states that she can receive patient on Monday.   Patient will transfer to [facility] on 11/27/19.   [place] Transitions of Care  Clinical Social Worker  Ph: [phone]         Expected Discharge Plan and Services                                                 Social Determinants of Health (SDOH) Interventions    Readmission Risk Interventions Readmission Risk Prevention Plan 07/20/2018  Transportation Screening Complete  PCP or Specialist Appt within 3-5 Days Complete  Home Care Screening Complete  HRI or Home Care Consult Complete  Social Work Consult for [place] Planning/Counseling Complete  Palliative Care Screening Complete  Medication Review [employer] photographer) Complete  Some recent data might be hidden

## 2019-11-26 NOTE — Progress Notes (Signed)
PROGRESS NOTE    [name]  INO:[id number] DOB: 1951/04/21 DOA: 10/25/2019 PCP: [name], FNP    Brief Narrative:  69 year old male, obese, with PMH significant for paranoid schizophrenia, prolonged QT interval, non-Hodgkin's lymphoma, hypertension, hyperlipidemia, COPD, diastolic congestive heart failure and chronic kidney disease. Patient lives in a group home and on12/9 patient was sent to the ED for worsening shortness of breath, fever chills and low O2 sat at 89%. In the ED, patient was started on O2 by nasal cannula. Work-up showed elevated creatinine to 3.09 (up from 1.67). COVID-19 PCR was positive. Chest x-ray revealed multifocal pneumonia. Patient was admitted to hospital service for further evaluation and management. Hospital course was complicated by AKI, acute urinary retention.  Patient completed a course of Decadron, IV remdesivir.  He continues to be encephalopathic with intermittent agitation and drowsiness, currently awaiting SNF placement.  Assessment & Plan:   Principal Problem:   Schizophrenia ([place]) Active Problems:   Sepsis secondary to UTI ([place])   COVID-19 virus infection   Obstructive uropathy   Prostatitis   Acute Hypoxic Respiratory Failure due to Covid 19 Viral Pneumonia- POA Improved, currently on room air, occasionally needs supplemental oxygen CT abdomen/pelvis on 11/10/2019 with nodular appearance right lung base with recommendations of surveillance and follow-up noncontrast CT in 6-8 weeks Patient completed a 5-day course of remdesivir on 10/29/2019 and a 10-day course of Decadron on 11/03/2019 Continue supplemental oxygen prn Discontinued contact/airborne isolation precautions 11/15/2019; as this is over 21 days from his positive Covid test on 10/25/2019  Acute kidney injury on CKD stage IV Sepsis with shock likely 2/2 UTI versus prostatitis Bladder scan Q shift Presenting with a creatinine of 3.09, which is up from his baseline of  1.67 Renal ultrasound unremarkable with no hydronephrosis or obstruction On 11/10/2019, patient with fever and white blood cell count up to 47.2 with a procalcitonin of 22.18 CT abdomen/pelvis notable for a malpositioned Foley catheter with potential cystitis, a sending urinary tract infection versus prostatitis. Foley catheter removed on 11/15/2019 Urine culture with no significant growth Blood cultures x2: No growth thus far Completed 14-day course of antibiotics on 11/24/2019 (cefepime to cefdinir) Daily BMP  Acute metabolic encephalopathy with intermittent hallucinations/agitation, history of schizoaffective disorder On multiple psych medsclozapine, benztropine, Thorazine, Lamictal, Lexapro at home. Patient was confused and more somnolent during his hospital course Psychiatry was consulted with multiple medication adjustments  Acute renal urinary retention Cr fluctuating, bladder scan Q shift, if retaining may re-insert foley Foley catheter discontinued on 11/15/2019  Drug reaction/fungal infection Currently on lower extremities, left flank, axilla, groin area Continue nystatin topical  QTC prolongation Repeat EKG 1/7 with QTc of 479 Keep potassium> 4, magnesium> 2  Chronic diastolicCHF, compensated Stable 2D echo in 09/2018 showed EF of 65 to 09% with LV diastolic dysfunction  Hyperlipidemia Continue Lipitor as tolerated  Hypothyroidism Continue levothyroxine  Obesity  Lifestyle modification advised  Pressure injury Stage II, left buttocks, present on admission Wound care as per nursing     DVT prophylaxis: Lovenox subq Code Status: Full Family Communication: None at bedside Disposition Plan: SNF, medically stable for discharge, awaiting bed placement  Consultants:   Psychiatry  Procedures:   None  Antimicrobials: Anti-infectives (From admission, onward)   Start     Dose/Rate Route Frequency Ordered Stop   11/16/19 1000  cefdinir (OMNICEF)  capsule 300 mg     300 mg Oral Every 12 hours 11/16/19 0858 11/23/19 2129   11/14/19 2200  ceFEPIme (MAXIPIME) 2  g in sodium chloride 0.9 % 100 mL IVPB  Status:  Discontinued     2 g 200 mL/hr over 30 Minutes Intravenous Every 12 hours 11/14/19 1204 11/16/19 0858   11/10/19 1700  ceFEPIme (MAXIPIME) 2 g in sodium chloride 0.9 % 100 mL IVPB  Status:  Discontinued     2 g 200 mL/hr over 30 Minutes Intravenous Daily 11/10/19 1648 11/14/19 1204   10/26/19 1000  remdesivir 100 mg in sodium chloride 0.9 % 100 mL IVPB  Status:  Discontinued     100 mg 200 mL/hr over 30 Minutes Intravenous Daily 10/25/19 2229 10/25/19 2254   10/26/19 1000  remdesivir 100 mg in sodium chloride 0.9 % 100 mL IVPB     100 mg 200 mL/hr over 30 Minutes Intravenous Daily 10/25/19 2135 10/29/19 1045   10/25/19 2230  cefTRIAXone (ROCEPHIN) 1 g in sodium chloride 0.9 % 100 mL IVPB  Status:  Discontinued     1 g 200 mL/hr over 30 Minutes Intravenous Every 24 hours 10/25/19 2228 10/30/19 1746   10/25/19 2230  doxycycline (VIBRA-TABS) tablet 100 mg  Status:  Discontinued     100 mg Oral Every 12 hours 10/25/19 2229 10/30/19 1746   10/25/19 2228  remdesivir 200 mg in sodium chloride 0.9% 250 mL IVPB  Status:  Discontinued     200 mg 580 mL/hr over 30 Minutes Intravenous Once 10/25/19 2229 10/25/19 2254   10/25/19 2200  remdesivir 200 mg in sodium chloride 0.9% 250 mL IVPB     200 mg 580 mL/hr over 30 Minutes Intravenous Once 10/25/19 2135 10/26/19 0256      Subjective: Pt seen and examined at bedside. Met pt sleeping comfortably.  Objective: Vitals:   11/25/19 1527 11/25/19 2135 11/26/19 0544 11/26/19 1329  BP: (!) 99/50 131/61 101/63 123/74  Pulse: 98 91 84 (!) 110  Resp: _0 Temp: 98.3 F (36.8 C) 98.3 F (36.8 C) 97.7 F (36.5 C) 97.9 F (36.6 C)  TempSrc: Oral     SpO2: 100% 100% 93% 94%  Weight:      Height:        Intake/Output Summary (Last 24 hours) at 11/26/2019 1435 Last data filed at  11/26/2019 1300 Gross per 24 hour  Intake 800 ml  Output 150 ml  Net 650 ml   Filed Weights   11/08/19 1324 11/15/19 0700 11/16/19 1100  Weight: 113 kg 113.3 kg 111.9 kg    Examination:  General: NAD, resting comfortably  Cardiovascular: S1, S2 present  Respiratory:  Diminished breath sounds bilaterally  Abdomen: Soft, nontender, nondistended, bowel sounds present  Musculoskeletal: No bilateral pedal edema noted  Skin: Normal  Psychiatry: Poor insight   Data Reviewed: I have personally reviewed following labs and imaging studies  CBC: Recent Labs  Lab 11/21/19 0409 11/22/19 0343 11/23/19 0423 11/24/19 0538 11/25/19 0439  WBC 15.9* 12.3* 13.6* 13.8* 13.2*  NEUTROABS 12.1*  --  9.9* 10.5* 9.8*  HGB 10.7* 10.8* 10.6* 10.8* 10.5*  HCT 33.7* 34.3* 33.5* 35.2* 33.8*  MCV 97.4 98.6 98.5 101.7* 99.4  PLT 210 213 241 237 962   Basic Metabolic Panel: Recent Labs  Lab 11/20/19 0343 11/21/19 0409 11/22/19 0343 11/23/19 0423 11/24/19 0538 11/25/19 0439  NA 141 138 141 138 141 140  K 3.6 3.7 4.0 4.3 4.2 4.0  CL 103 102 104 101 105 104  CO2 _1 GLUCOSE 155* 157* 128* 127* 123* 144*  BUN  _0 25* 25* 28*  CREATININE 1.66* 1.75* 1.71* 2.00* 1.93* 1.85*  CALCIUM 9.7 9.4 9.7 9.2 9.6 9.8  MG 1.9  --   --   --   --   --    GFR: Estimated Creatinine Clearance: 50.7 mL/min (A) (by C-G formula based on SCr of 1.85 mg/dL (H)). Liver Function Tests: Recent Labs  Lab 11/20/19 0343 11/21/19 0409 11/22/19 0343  AST _1 ALT _2 ALKPHOS 69 75 77  BILITOT 0.5 0.4 0.4  PROT 5.9* 5.6* 5.6*  ALBUMIN 3.0* 2.9* 2.9*   No results for input(s): LIPASE, AMYLASE in the last 168 hours. No results for input(s): AMMONIA in the last 168 hours. Coagulation Profile: No results for input(s): INR, PROTIME in the last 168 hours. Cardiac Enzymes: No results for input(s): CKTOTAL, CKMB, CKMBINDEX, TROPONINI in the last 168 hours. BNP (last 3 results) No  results for input(s): PROBNP in the last 8760 hours. HbA1C: No results for input(s): HGBA1C in the last 72 hours. CBG: No results for input(s): GLUCAP in the last 168 hours. Lipid Profile: No results for input(s): CHOL, HDL, LDLCALC, TRIG, CHOLHDL, LDLDIRECT in the last 72 hours. Thyroid Function Tests: No results for input(s): TSH, T4TOTAL, FREET4, T3FREE, THYROIDAB in the last 72 hours. Anemia Panel: No results for input(s): VITAMINB12, FOLATE, FERRITIN, TIBC, IRON, RETICCTPCT in the last 72 hours. Sepsis Labs: No results for input(s): PROCALCITON, LATICACIDVEN in the last 168 hours.  Recent Results (from the past 240 hour(s))  SARS CORONAVIRUS 2 (TAT 6-24 HRS) Nasopharyngeal Nasopharyngeal Swab     Status: None   Collection Time: 11/24/19 12:16 PM   Specimen: Nasopharyngeal Swab  Result Value Ref Range Status   SARS Coronavirus 2 NEGATIVE NEGATIVE Final    Comment: (NOTE) SARS-CoV-2 target nucleic acids are NOT DETECTED. The SARS-CoV-2 RNA is generally detectable in upper and lower respiratory specimens during the acute phase of infection. Negative results do not preclude SARS-CoV-2 infection, do not rule out co-infections with other pathogens, and should not be used as the sole basis for treatment or other patient management decisions. Negative results must be combined with clinical observations, patient history, and epidemiological information. The expected result is Negative. Fact Sheet for Patients: SugarRoll.be Fact Sheet for Healthcare Providers: https://www.woods-mathews.com/ This test is not yet approved or cleared by the [place] FDA and  has been authorized for detection and/or diagnosis of SARS-CoV-2 by FDA under an Emergency Use Authorization (EUA). This EUA will remain  in effect (meaning this test can be used) for the duration of the COVID-19 declaration under Section 56 4(b)(1) of the Act, 21 U.S.C. section  360bbb-3(b)(1), unless the authorization is terminated or revoked sooner. Performed at [facility], [place] [address] [place], [place] [address]      Radiology Studies: No results found.  Scheduled Meds: . aspirin EC  81 mg Oral Daily  . atorvastatin  20 mg Oral q1800  . benztropine  0.5 mg Oral BID  . cholecalciferol  5,000 Units Oral Daily  . clonazePAM  1 mg Oral QHS  . clotrimazole   Topical BID  . cloZAPine  150 mg Oral BID  . docusate sodium  100 mg Oral Daily  . enoxaparin (LOVENOX) injection  40 mg Subcutaneous QHS  . escitalopram  10 mg Oral QAC breakfast  . feeding supplement (ENSURE ENLIVE)  237 mL Oral BID BM  . fluticasone  1 puff Inhalation BID  . folic acid  1 mg Oral Daily  . levothyroxine  100 mcg Oral Q0600  . mouth rinse  15 mL Mouth Rinse BID  . multivitamin with minerals  1 tablet Oral Daily  . nystatin  1 Bottle Topical Daily  . polyethylene glycol  17 g Oral Daily  . QUEtiapine  12.5 mg Oral BID  . QUEtiapine  25 mg Oral QHS  . senna  1 tablet Oral Daily  . sodium chloride flush  10-40 mL Intracatheter Q12H  . thiamine  100 mg Oral Daily  . vitamin C  500 mg Oral Daily  . zinc sulfate  220 mg Oral Daily   Continuous Infusions:    LOS: 32 days   Alma Friendly, MD [facility] Pager On Amion  If 7PM-7AM, please contact night-coverage 11/26/2019, 2:35 PM

## 2019-11-27 MED ORDER — ONDANSETRON HCL 4 MG/2ML IJ SOLN
4.0000 mg | Freq: Once | INTRAMUSCULAR | Status: AC
  Administered 2019-11-27: 4 mg via INTRAVENOUS
  Filled 2019-11-27: qty 2

## 2019-11-27 NOTE — Plan of Care (Signed)
  Problem: Clinical Measurements: Goal: Will remain free from infection Outcome: Progressing   Problem: Elimination: Goal: Will not experience complications related to bowel motility Outcome: Progressing   Problem: Elimination: Goal: Will not experience complications related to urinary retention Outcome: Progressing   Problem: Safety: Goal: Ability to remain free from injury will improve Outcome: Progressing   Problem: Skin Integrity: Goal: Risk for impaired skin integrity will decrease Outcome: Progressing

## 2019-11-27 NOTE — Progress Notes (Signed)
PROGRESS NOTE    [name]  BDZ:[id number] DOB: October 26, 1951 DOA: 10/25/2019 PCP: [name], FNP    Brief Narrative:  69 year old male, obese, with PMH significant for paranoid schizophrenia, prolonged QT interval, non-Hodgkin's lymphoma, hypertension, hyperlipidemia, COPD, diastolic congestive heart failure and chronic kidney disease. Patient lives in a group home and on12/9 patient was sent to the ED for worsening shortness of breath, fever chills and low O2 sat at 89%. In the ED, patient was started on O2 by nasal cannula. Work-up showed elevated creatinine to 3.09 (up from 1.67). COVID-19 PCR was positive. Chest x-ray revealed multifocal pneumonia. Patient was admitted to hospital service for further evaluation and management. Hospital course was complicated by AKI, acute urinary retention.  Patient completed a course of Decadron, IV remdesivir.  He continues to be encephalopathic with intermittent agitation and drowsiness, currently awaiting SNF placement.  Assessment & Plan:   Principal Problem:   Schizophrenia ([place]) Active Problems:   Sepsis secondary to UTI ([place])   COVID-19 virus infection   Obstructive uropathy   Prostatitis   Acute Hypoxic Respiratory Failure due to Covid 19 Viral Pneumonia- POA Improved, currently on room air, occasionally needs supplemental oxygen CT abdomen/pelvis on 11/10/2019 with nodular appearance right lung base with recommendations of surveillance and follow-up noncontrast CT in 6-8 weeks Patient completed a 5-day course of remdesivir on 10/29/2019 and a 10-day course of Decadron on 11/03/2019 Continue supplemental oxygen prn Discontinued contact/airborne isolation precautions 11/15/2019; as this is over 21 days from his positive Covid test on 10/25/2019 Repeat Covid test is negative on 11/24/2019  Acute kidney injury on CKD stage IV Sepsis with shock likely 2/2 UTI versus prostatitis Bladder scan Q shift Presenting with a creatinine  of 3.09, which is up from his baseline of 1.67 Renal ultrasound unremarkable with no hydronephrosis or obstruction On 11/10/2019, patient with fever and white blood cell count up to 47.2 with a procalcitonin of 22.18 CT abdomen/pelvis notable for a malpositioned Foley catheter with potential cystitis, a sending urinary tract infection versus prostatitis. Foley catheter removed on 11/15/2019 Urine culture with no significant growth Blood cultures x2: No growth thus far Completed 14-day course of antibiotics on 11/24/2019 (cefepime to cefdinir) Daily BMP  Acute metabolic encephalopathy with intermittent hallucinations/agitation, history of schizoaffective disorder On multiple psych medsclozapine, benztropine, Thorazine, Lamictal, Lexapro at home. Patient was confused and more somnolent during his hospital course Psychiatry was consulted with multiple medication adjustments  Acute renal urinary retention Cr fluctuating, bladder scan Q shift, if retaining may re-insert foley Foley catheter discontinued on 11/15/2019  Drug reaction/fungal infection Currently on lower extremities, left flank, axilla, groin area Continue nystatin topical  QTC prolongation Repeat EKG 1/7 with QTc of 479 Keep potassium> 4, magnesium> 2  Chronic diastolicCHF, compensated Stable 2D echo in 09/2018 showed EF of 65 to 34% with LV diastolic dysfunction  Hyperlipidemia Continue Lipitor as tolerated  Hypothyroidism Continue levothyroxine  Obesity  Lifestyle modification advised  Pressure injury Stage II, left buttocks, present on admission Wound care as per nursing    PLEASE DO NOT PUT PT ON RESTRAINTS IF AGITATED (CALL PROVIDER FOR MEDICATIONS INSTEAD), PT NEEDS TO BE OFF RESTRAINTS FOR 24H BEFORE HE CAN BE ACCEPTED IN SNF, THANKS   DVT prophylaxis: Lovenox subq Code Status: Full Family Communication: None at bedside Disposition Plan: SNF, medically stable for discharge, awaiting bed  placement  Consultants:   Psychiatry  Procedures:   None  Antimicrobials: Anti-infectives (From admission, onward)   Start  Dose/Rate Route Frequency Ordered Stop   11/16/19 1000  cefdinir (OMNICEF) capsule 300 mg     300 mg Oral Every 12 hours 11/16/19 0858 11/23/19 2129   11/14/19 2200  ceFEPIme (MAXIPIME) 2 g in sodium chloride 0.9 % 100 mL IVPB  Status:  Discontinued     2 g 200 mL/hr over 30 Minutes Intravenous Every 12 hours 11/14/19 1204 11/16/19 0858   11/10/19 1700  ceFEPIme (MAXIPIME) 2 g in sodium chloride 0.9 % 100 mL IVPB  Status:  Discontinued     2 g 200 mL/hr over 30 Minutes Intravenous Daily 11/10/19 1648 11/14/19 1204   10/26/19 1000  remdesivir 100 mg in sodium chloride 0.9 % 100 mL IVPB  Status:  Discontinued     100 mg 200 mL/hr over 30 Minutes Intravenous Daily 10/25/19 2229 10/25/19 2254   10/26/19 1000  remdesivir 100 mg in sodium chloride 0.9 % 100 mL IVPB     100 mg 200 mL/hr over 30 Minutes Intravenous Daily 10/25/19 2135 10/29/19 1045   10/25/19 2230  cefTRIAXone (ROCEPHIN) 1 g in sodium chloride 0.9 % 100 mL IVPB  Status:  Discontinued     1 g 200 mL/hr over 30 Minutes Intravenous Every 24 hours 10/25/19 2228 10/30/19 1746   10/25/19 2230  doxycycline (VIBRA-TABS) tablet 100 mg  Status:  Discontinued     100 mg Oral Every 12 hours 10/25/19 2229 10/30/19 1746   10/25/19 2228  remdesivir 200 mg in sodium chloride 0.9% 250 mL IVPB  Status:  Discontinued     200 mg 580 mL/hr over 30 Minutes Intravenous Once 10/25/19 2229 10/25/19 2254   10/25/19 2200  remdesivir 200 mg in sodium chloride 0.9% 250 mL IVPB     200 mg 580 mL/hr over 30 Minutes Intravenous Once 10/25/19 2135 10/26/19 0256      Subjective: Patient seen and examined at bedside.  Intermittently yelling for help, looks comfortable.  Objective: Vitals:   11/27/19 0622 11/27/19 0904 11/27/19 0914 11/27/19 1301  BP:  122/64  (!) 115/59  Pulse: 90 97 82 (!) 101  Resp: 16  16 16   Temp:  97.7 F (36.5 C) 97.9 F (36.6 C)  98.6 F (37 C)  TempSrc: Oral Oral  Oral  SpO2: 96% 93% 93% 94%  Weight:      Height:        Intake/Output Summary (Last 24 hours) at 11/27/2019 1502 Last data filed at 11/27/2019 1300 Gross per 24 hour  Intake 600 ml  Output 1050 ml  Net -450 ml   Filed Weights   11/08/19 1324 11/15/19 0700 11/16/19 1100  Weight: 113 kg 113.3 kg 111.9 kg    Examination:  General: NAD   Cardiovascular: S1, S2 present  Respiratory:  Diminished breath sounds at the bases  Abdomen: Soft, nontender, nondistended, bowel sounds present  Musculoskeletal: No bilateral pedal edema noted  Skin: Normal  Psychiatry:  Poor insight    Data Reviewed: I have personally reviewed following labs and imaging studies  CBC: Recent Labs  Lab 11/21/19 0409 11/22/19 0343 11/23/19 0423 11/24/19 0538 11/25/19 0439  WBC 15.9* 12.3* 13.6* 13.8* 13.2*  NEUTROABS 12.1*  --  9.9* 10.5* 9.8*  HGB 10.7* 10.8* 10.6* 10.8* 10.5*  HCT 33.7* 34.3* 33.5* 35.2* 33.8*  MCV 97.4 98.6 98.5 101.7* 99.4  PLT 210 213 241 237 295   Basic Metabolic Panel: Recent Labs  Lab 11/21/19 0409 11/22/19 0343 11/23/19 0423 11/24/19 0538 11/25/19 0439  NA 138 141  138 141 140  K 3.7 4.0 4.3 4.2 4.0  CL 102 104 101 105 104  CO2 28 30 29 27 29   GLUCOSE 157* 128* 127* 123* 144*  BUN 15 14 25* 25* 28*  CREATININE 1.75* 1.71* 2.00* 1.93* 1.85*  CALCIUM 9.4 9.7 9.2 9.6 9.8   GFR: Estimated Creatinine Clearance: 50.7 mL/min (A) (by C-G formula based on SCr of 1.85 mg/dL (H)). Liver Function Tests: Recent Labs  Lab 11/21/19 0409 11/22/19 0343  AST 20 19  ALT 20 20  ALKPHOS 75 77  BILITOT 0.4 0.4  PROT 5.6* 5.6*  ALBUMIN 2.9* 2.9*   No results for input(s): LIPASE, AMYLASE in the last 168 hours. No results for input(s): AMMONIA in the last 168 hours. Coagulation Profile: No results for input(s): INR, PROTIME in the last 168 hours. Cardiac Enzymes: No results for input(s):  CKTOTAL, CKMB, CKMBINDEX, TROPONINI in the last 168 hours. BNP (last 3 results) No results for input(s): PROBNP in the last 8760 hours. HbA1C: No results for input(s): HGBA1C in the last 72 hours. CBG: No results for input(s): GLUCAP in the last 168 hours. Lipid Profile: No results for input(s): CHOL, HDL, LDLCALC, TRIG, CHOLHDL, LDLDIRECT in the last 72 hours. Thyroid Function Tests: No results for input(s): TSH, T4TOTAL, FREET4, T3FREE, THYROIDAB in the last 72 hours. Anemia Panel: No results for input(s): VITAMINB12, FOLATE, FERRITIN, TIBC, IRON, RETICCTPCT in the last 72 hours. Sepsis Labs: No results for input(s): PROCALCITON, LATICACIDVEN in the last 168 hours.  Recent Results (from the past 240 hour(s))  SARS CORONAVIRUS 2 (TAT 6-24 HRS) Nasopharyngeal Nasopharyngeal Swab     Status: None   Collection Time: 11/24/19 12:16 PM   Specimen: Nasopharyngeal Swab  Result Value Ref Range Status   SARS Coronavirus 2 NEGATIVE NEGATIVE Final    Comment: (NOTE) SARS-CoV-2 target nucleic acids are NOT DETECTED. The SARS-CoV-2 RNA is generally detectable in upper and lower respiratory specimens during the acute phase of infection. Negative results do not preclude SARS-CoV-2 infection, do not rule out co-infections with other pathogens, and should not be used as the sole basis for treatment or other patient management decisions. Negative results must be combined with clinical observations, patient history, and epidemiological information. The expected result is Negative. Fact Sheet for Patients: SugarRoll.be Fact Sheet for Healthcare Providers: https://www.woods-mathews.com/ This test is not yet approved or cleared by the [place] FDA and  has been authorized for detection and/or diagnosis of SARS-CoV-2 by FDA under an Emergency Use Authorization (EUA). This EUA will remain  in effect (meaning this test can be used) for the duration of  the COVID-19 declaration under Section 56 4(b)(1) of the Act, 21 U.S.C. section 360bbb-3(b)(1), unless the authorization is terminated or revoked sooner. Performed at [facility], [place] [address] [place], [place] [address]      Radiology Studies: No results found.  Scheduled Meds: . aspirin EC  81 mg Oral Daily  . atorvastatin  20 mg Oral q1800  . benztropine  0.5 mg Oral BID  . cholecalciferol  5,000 Units Oral Daily  . clonazePAM  1 mg Oral QHS  . clotrimazole   Topical BID  . cloZAPine  150 mg Oral BID  . docusate sodium  100 mg Oral Daily  . enoxaparin (LOVENOX) injection  40 mg Subcutaneous QHS  . escitalopram  10 mg Oral QAC breakfast  . feeding supplement (ENSURE ENLIVE)  237 mL Oral BID BM  . fluticasone  1 puff Inhalation BID  .  folic acid  1 mg Oral Daily  . levothyroxine  100 mcg Oral Q0600  . mouth rinse  15 mL Mouth Rinse BID  . multivitamin with minerals  1 tablet Oral Daily  . nystatin  1 Bottle Topical Daily  . polyethylene glycol  17 g Oral Daily  . QUEtiapine  12.5 mg Oral BID  . QUEtiapine  25 mg Oral QHS  . senna  1 tablet Oral Daily  . sodium chloride flush  10-40 mL Intracatheter Q12H  . thiamine  100 mg Oral Daily  . vitamin C  500 mg Oral Daily  . zinc sulfate  220 mg Oral Daily   Continuous Infusions:    LOS: 33 days   Alma Friendly, MD [facility] Pager On Amion  If 7PM-7AM, please contact night-coverage 11/27/2019, 3:02 PM

## 2019-11-27 NOTE — Progress Notes (Addendum)
Restraints removed at 0730am on 11/27/19 by night RN. Order expired.   Patient for SNF d/c. Will keep restraints off, and use frequent rounding to assist patient.     [place], Therapist, sports

## 2019-11-27 NOTE — Progress Notes (Signed)
Dr. Murray Hodgkins paged about restraints order. Waiting for her orders.

## 2019-11-28 LAB — CBC WITH DIFFERENTIAL/PLATELET
Abs Immature Granulocytes: 0.1 10*3/uL — ABNORMAL HIGH (ref 0.00–0.07)
Basophils Absolute: 0.1 10*3/uL (ref 0.0–0.1)
Basophils Relative: 0 %
Eosinophils Absolute: 0.1 10*3/uL (ref 0.0–0.5)
Eosinophils Relative: 1 %
HCT: 35.4 % — ABNORMAL LOW (ref 39.0–52.0)
Hemoglobin: 11.2 g/dL — ABNORMAL LOW (ref 13.0–17.0)
Immature Granulocytes: 1 %
Lymphocytes Relative: 9 %
Lymphs Abs: 1.5 10*3/uL (ref 0.7–4.0)
MCH: 31.6 pg (ref 26.0–34.0)
MCHC: 31.6 g/dL (ref 30.0–36.0)
MCV: 100 fL (ref 80.0–100.0)
Monocytes Absolute: 0.9 10*3/uL (ref 0.1–1.0)
Monocytes Relative: 5 %
Neutro Abs: 13.9 10*3/uL — ABNORMAL HIGH (ref 1.7–7.7)
Neutrophils Relative %: 84 %
Platelets: 228 10*3/uL (ref 150–400)
RBC: 3.54 MIL/uL — ABNORMAL LOW (ref 4.22–5.81)
RDW: 15.9 % — ABNORMAL HIGH (ref 11.5–15.5)
WBC: 16.5 10*3/uL — ABNORMAL HIGH (ref 4.0–10.5)
nRBC: 0 % (ref 0.0–0.2)

## 2019-11-28 LAB — SARS CORONAVIRUS 2 (TAT 6-24 HRS): SARS Coronavirus 2: NEGATIVE

## 2019-11-28 MED ORDER — ENSURE ENLIVE PO LIQD: 237.0000 mL | Freq: Two times a day (BID) | ORAL | 12 refills | Status: DC

## 2019-11-28 MED ORDER — CLONAZEPAM 1 MG PO TABS: 1.0000 mg | ORAL_TABLET | Freq: Every day | ORAL | 0 refills | Status: DC

## 2019-11-28 MED ORDER — QUETIAPINE FUMARATE 25 MG PO TABS: 25.0000 mg | ORAL_TABLET | Freq: Every day | ORAL | Status: DC

## 2019-11-28 MED ORDER — ASCORBIC ACID 500 MG PO TABS: 500.0000 mg | ORAL_TABLET | Freq: Every day | ORAL | Status: DC

## 2019-11-28 MED ORDER — ZINC SULFATE 220 (50 ZN) MG PO CAPS: 220.0000 mg | ORAL_CAPSULE | Freq: Every day | ORAL | Status: DC

## 2019-11-28 MED ORDER — QUETIAPINE FUMARATE 25 MG PO TABS: 12.5000 mg | ORAL_TABLET | Freq: Two times a day (BID) | ORAL | Status: DC

## 2019-11-28 MED ORDER — FLUTICASONE PROPIONATE HFA 220 MCG/ACT IN AERO: 1.0000 | INHALATION_SPRAY | Freq: Two times a day (BID) | RESPIRATORY_TRACT | 12 refills | Status: DC

## 2019-11-28 MED ORDER — THIAMINE HCL 100 MG PO TABS: 100.0000 mg | ORAL_TABLET | Freq: Every day | ORAL | Status: DC

## 2019-11-28 MED ORDER — CLOTRIMAZOLE 1 % EX CREA: TOPICAL_CREAM | Freq: Two times a day (BID) | CUTANEOUS | 0 refills | Status: DC

## 2019-11-28 MED ORDER — IPRATROPIUM-ALBUTEROL 20-100 MCG/ACT IN AERS: 1.0000 | INHALATION_SPRAY | Freq: Four times a day (QID) | RESPIRATORY_TRACT | Status: DC | PRN

## 2019-11-28 MED ORDER — FOLIC ACID 1 MG PO TABS: 1.0000 mg | ORAL_TABLET | Freq: Every day | ORAL | 0 refills | Status: AC

## 2019-11-28 MED ORDER — ADULT MULTIVITAMIN W/MINERALS CH: 1.0000 | ORAL_TABLET | Freq: Every day | ORAL | Status: DC

## 2019-11-28 NOTE — Progress Notes (Signed)
Writer called report into [facility] and spoke to [place] LPN to give report. [facility] here to transport patient

## 2019-11-28 NOTE — TOC Transition Note (Addendum)
Transition of Care [facility]) - CM/SW Discharge Note   Patient Details  Name: [name] MRN: [id number] Date of Birth: February 01, 1951  Transition of [facility] CM/SW Contact:  Leeroy Cha, RN Phone Number: 11/28/2019, 11:53 AM   Clinical Narrative:    Patient transferred to [facility] via ptar.  Jari Favre notified/ Floor notified of ptar being called at 1203  Final next level of care: Skilled Nursing Facility Barriers to Discharge: Barriers Resolved   Patient Goals and CMS Choice Patient states their goals for this hospitalization and ongoing recovery are:: unable to clearly state      Discharge Placement                       Discharge Plan and Services   Discharge Planning Services: CM Consult Post Acute Care Choice: [place]                               Social Determinants of Health (SDOH) Interventions     Readmission Risk Interventions Readmission Risk Prevention Plan 07/20/2018  Transportation Screening Complete  PCP or Specialist Appt within 3-5 Days Complete  Home Care Screening Complete  HRI or Home Care Consult Complete  Social Work Consult for [place] Planning/Counseling Complete  Palliative Care Screening Complete  Medication Review [employer] photographer) Complete  Some recent data might be hidden

## 2019-11-28 NOTE — Discharge Summary (Signed)
Discharge Summary  [name] UQJ:[id number] DOB: [birth year]  PCP: [name], FNP  Admit date: 10/25/2019 Discharge date: 11/28/2019  Time spent: 40 mins  Recommendations for Outpatient Follow-up:  1. PCP asper SNF 2. Psychiatry as scheduled  Discharge Diagnoses:  Active Hospital Problems   Diagnosis Date Noted  . Schizophrenia ([place]) 12/24/2012  . Obstructive uropathy 11/13/2019  . Prostatitis 11/13/2019  . COVID-19 virus infection 10/25/2019  . Sepsis secondary to UTI [facility]) 03/27/2015    Resolved Hospital Problems  No resolved problems to display.    Discharge Condition: Stable  Diet recommendation: As tolerated  Vitals:   11/27/19 2102 11/27/19 2141  BP:  130/60  Pulse:  100  Resp:  18  Temp:  97.9 F (36.6 C)  SpO2: 93% 99%    History of present illness:  69 year old male, obese, with PMH significant for paranoid schizophrenia, prolonged QT interval, non-Hodgkin's lymphoma, hypertension, hyperlipidemia, COPD, diastolic congestive heart failure and chronic kidney disease. Patient lives in a group home and on12/9 patient was sent to the ED for worsening shortness of breath, fever chills and low O2 sat at 89%. In the ED, patient was started on O2 by nasal cannula. Work-up showed elevated creatinine to 3.09 (up from 1.67). COVID-19 PCR was positive. Chest x-ray revealed multifocal pneumonia. Patient was admitted to hospital service for further evaluation and management. Hospital course was complicated by AKI, acute urinary retention. Patient completed a course of Decadron, IV remdesivir.    Today, patient denies any new complaints.  Appears calm, looks comfortable.   Hospital Course:  Principal Problem:   Schizophrenia [facility]) Active Problems:   Sepsis secondary to UTI ([place])   COVID-19 virus infection   Obstructive uropathy   Prostatitis   Acute Hypoxic Respiratory Failure due to Covid 19 Viral Pneumonia- POA Improved, currently on room air CT  abdomen/pelvis on 11/10/2019 with nodular appearance right lung base with recommendations of surveillance and follow-up noncontrast CT in 6-8 weeks Patient completed a 5-day course of remdesivir on 10/29/2019 and a 10-day course of Decadron on 11/03/2019 Discontinued contact/airborne isolation precautions 11/15/2019; as this is over 21 days from his positive Covid test on 10/25/2019 Repeat Covid test is negative on 11/24/2019 and 11/27/2019  Acute kidney injury on CKD stage IV Sepsis with shock likely 2/2 UTI versus prostatitis Bladder scan Q shift Presenting with a creatinine of 3.09, which is up from his baseline of 1.67 Renal ultrasound unremarkable with no hydronephrosis or obstruction On 11/10/2019, patient with fever and white blood cell count up to 47.2 with a procalcitonin of 22.18 CT abdomen/pelvis notable for a malpositioned Foley catheter with potential cystitis, a sending urinary tract infection versus prostatitis. Foley catheter removed on 11/15/2019 Urine culture with no significant growth Blood cultures x2: No growth thus far Completed 14-day course of antibiotics on 11/24/2019 (cefepimeto cefdinir)  Acute metabolic encephalopathy with intermittent hallucinations/agitation, history of schizoaffective disorder On multiple psych medsclozapine, benztropine, Thorazine, Lamictal, Lexapro at home. Patient was confused and more somnolent during his hospital course Psychiatry was consulted with multiple medication adjustments, discontinued Thorazine, Lamictal, and started Seroquel  Acute renal urinary retention Foley catheter discontinued on 11/15/2019 Cr fluctuating, recommend bladder scan Q shift, for reurinary retention May need tamsulosin on the long run if acute urinary retention persists  Drug reaction/fungal infection Currently on lower extremities, left flank, axilla, groin area Continue nystatin topical  QTC prolongation Repeat EKG 1/7 with QTc of 479 Keep potassium>  4, magnesium> 2  Chronic diastolicCHF, compensated Stable 2D  echo in 09/2018 showed EF of 65 to 44% with LV diastolic dysfunction  Hyperlipidemia Continue Lipitor as tolerated  Hypothyroidism Continue levothyroxine  Obesity  Lifestyle modification advised  Pressure injury Stage II, left buttocks, present on admission Continue wound care        Malnutrition Type:  Nutrition Problem: Increased nutrient needs Etiology: acute illness   Malnutrition Characteristics:  Signs/Symptoms: estimated needs   Nutrition Interventions:  Interventions: Ensure Enlive (each supplement provides 350kcal and 20 grams of protein), Magic cup, MVI   Estimated body mass index is 33.46 kg/m as calculated from the following:   Height as of this encounter: 6' (1.829 m).   Weight as of this encounter: 111.9 kg.    Procedures:  None  Consultations:  Psychiatry  Discharge Exam: BP 130/60 (BP Location: Left Arm)   Pulse 100   Temp 97.9 F (36.6 C) (Oral)   Resp 18   Ht 6' (1.829 m)   Wt 111.9 kg   SpO2 99%   BMI 33.46 kg/m   General: NAD, looks comfortable Cardiovascular: S1, S2 present Respiratory: Diminished breath sounds at the bases  Discharge Instructions You were cared for by a hospitalist during your hospital stay. If you have any questions about your discharge medications or the care you received while you were in the hospital after you are discharged, you can call the unit and asked to speak with the hospitalist on call if the hospitalist that took care of you is not available. Once you are discharged, your primary care physician will handle any further medical issues. Please note that NO REFILLS for any discharge medications will be authorized once you are discharged, as it is imperative that you return to your primary care physician (or establish a relationship with a primary care physician if you do not have one) for your aftercare needs so that they can  reassess your need for medications and monitor your lab values.  Discharge Instructions    Diet - low sodium heart healthy   Complete by: As directed    Increase activity slowly   Complete by: As directed      Allergies as of 11/28/2019      Reactions   Sulfamethoxazole Rash      Medication List    STOP taking these medications   albuterol 108 (90 Base) MCG/ACT inhaler Commonly known as: VENTOLIN HFA   chlorproMAZINE 100 MG tablet Commonly known as: THORAZINE   chlorproMAZINE 25 MG tablet Commonly known as: THORAZINE   chlorproMAZINE 50 MG tablet Commonly known as: THORAZINE   ketoconazole 2 % cream Commonly known as: NIZORAL   lamoTRIgine 25 MG tablet Commonly known as: LAMICTAL   meloxicam 15 MG tablet Commonly known as: Mobic   methylPREDNISolone 4 MG Tbpk tablet Commonly known as: MEDROL DOSEPAK   metoprolol tartrate 25 MG tablet Commonly known as: LOPRESSOR     TAKE these medications   acetaminophen 500 MG tablet Commonly known as: TYLENOL Take 1,000 mg by mouth every 6 (six) hours as needed for mild pain.   ascorbic acid 500 MG tablet Commonly known as: VITAMIN C Take 1 tablet (500 mg total) by mouth daily. Start taking on: November 29, 2019   aspirin EC 81 MG tablet Take 81 mg by mouth daily.   atorvastatin 20 MG tablet Commonly known as: LIPITOR Take 1 tablet (20 mg total) by mouth daily.   benztropine 0.5 MG tablet Commonly known as: COGENTIN Take 1 tablet (0.5 mg total) by mouth 2 (  two) times daily.   clonazePAM 1 MG tablet Commonly known as: KLONOPIN Take 1 tablet (1 mg total) by mouth at bedtime.   clotrimazole 1 % cream Commonly known as: LOTRIMIN Apply topically 2 (two) times daily.   cloZAPine 100 MG tablet Commonly known as: CLOZARIL Take 100 mg by mouth 2 (two) times daily.   clozapine 50 MG tablet Commonly known as: CLOZARIL Take 50 mg by mouth 2 (two) times daily.   Dialyvite Vitamin D 5000 125 MCG (5000 UT)  capsule Generic drug: Cholecalciferol Take 5,000 Units by mouth daily.   docusate sodium 100 MG capsule Commonly known as: COLACE Take 100 mg by mouth daily.   escitalopram 10 MG tablet Commonly known as: LEXAPRO Take 10 mg by mouth daily before breakfast.   feeding supplement (ENSURE ENLIVE) Liqd Take 237 mLs by mouth 2 (two) times daily between meals.   Fish Oil 1000 MG Caps Take 4,000 mg by mouth daily.   fluticasone 220 MCG/ACT inhaler Commonly known as: FLOVENT HFA Inhale 1 puff into the lungs 2 (two) times daily.   folic acid 1 MG tablet Commonly known as: FOLVITE Take 1 tablet (1 mg total) by mouth daily. Start taking on: November 29, 2019   Ipratropium-Albuterol 20-100 MCG/ACT Aers respimat Commonly known as: COMBIVENT Inhale 1 puff into the lungs every 6 (six) hours as needed for wheezing.   levothyroxine 100 MCG tablet Commonly known as: SYNTHROID Take 100 mcg daily by mouth.   multivitamin with minerals Tabs tablet Take 1 tablet by mouth daily. Start taking on: November 29, 2019   nystatin powder Commonly known as: MYCOSTATIN/NYSTOP Apply topically 4 (four) times daily. What changed:   how much to take  when to take this  additional instructions   polyethylene glycol 17 g packet Commonly known as: MIRALAX / GLYCOLAX Take 17 g daily by mouth. Dissolve 17g in 8oz of water/juice and drink by mouth every day   QUEtiapine 25 MG tablet Commonly known as: SEROQUEL Take 0.5 tablets (12.5 mg total) by mouth 2 (two) times daily.   QUEtiapine 25 MG tablet Commonly known as: SEROQUEL Take 1 tablet (25 mg total) by mouth at bedtime.   senna 8.6 MG Tabs tablet Commonly known as: SENOKOT Take 1 tablet by mouth daily.   thiamine 100 MG tablet Take 1 tablet (100 mg total) by mouth daily. Start taking on: November 29, 2019   zinc sulfate 220 (50 Zn) MG capsule Take 1 capsule (220 mg total) by mouth daily. Start taking on: November 29, 2019       Allergies  Allergen Reactions  . Sulfamethoxazole Rash    Contact information for follow-up providers    [name], [place]. Schedule an appointment as soon as possible for a visit in 1 week(s).   Specialty: Nurse Practitioner Contact information: [place] [address] [phone]            Contact information for after-discharge care    Destination    [facility] Preferred SNF .   Service: Skilled Nursing Contact information: [address] [place] [phone]                   The results of significant diagnostics from this hospitalization (including imaging, microbiology, ancillary and laboratory) are listed below for reference.    Significant Diagnostic Studies: CT ABDOMEN PELVIS WO CONTRAST  Addendum Date: 11/11/2019   ADDENDUM REPORT: 11/11/2019 02:38 ADDENDUM: These results were called by  telephone on 11/11/2019 at 2:38 am to provider Lang Snow , who verbally acknowledged these results. Electronically Signed   By: Lovena Le M.D.   On: 11/11/2019 02:38   Result Date: 11/11/2019 CLINICAL DATA:  Abdominal pain, acute renal failure and sepsis. EXAM: CT ABDOMEN AND PELVIS WITHOUT CONTRAST TECHNIQUE: Multidetector CT imaging of the abdomen and pelvis was performed following the standard protocol without IV contrast. COMPARISON:  Radiograph 11/10/2019, renal ultrasound 76/73/4193, CT renal colic 79/12/4095 FINDINGS: Lower chest: There mixed patchy opacities in the lung bases. Slightly nodular appearance of consolidation in the right lower lobe warrants continued surveillance to resolution. Trace effusions are present. Some bandlike areas of opacity likely reflect atelectasis and/or scarring Hepatobiliary: No focal liver abnormality is seen. No gallstones, gallbladder wall thickening, or biliary dilatation. Pancreas: Unremarkable. No pancreatic ductal dilatation or surrounding inflammatory changes.  Spleen: Mild splenomegaly.  No focal splenic lesions. Adrenals/Urinary Tract: Normal adrenal glands. Mild bilateral renal atrophy. No visible concerning renal lesions. Mild bilateral perinephric stranding similar to comparison study. Slightly asymmetric right periureteral stranding is noted as well as circumferential bladder wall thickening and few foci of anti dependent gas. A Foley catheter is positioned with the tip in the urinary bladder and inflated balloon within the level of the prostatic urethra. No visible urolithiasis or hydronephrosis. Stomach/Bowel: Distal esophagus, stomach and duodenal sweep are unremarkable. No small bowel wall thickening or dilatation. No evidence of obstruction. A normal appendix is visualized. No colonic dilatation or wall thickening. Vascular/Lymphatic: Atherosclerotic plaque within the normal caliber aorta. No suspicious or enlarged lymph nodes in the included lymphatic chains. Reproductive: Inflated Foley catheter balloon in the mid prostate. Hazy stranding is also seen about the prostate. Coarse eccentric prostate calcifications are noted. Mild prostatomegaly is evident. Other: Few foci of subcutaneous gas in the right lower quadrant may be related to injectable use. No free fluid or free air in the abdomen or pelvis. Perivesicular hazy stranding is noted. Musculoskeletal: Multilevel degenerative changes are present in the imaged portions of the spine. No acute osseous abnormality or suspicious osseous lesion. IMPRESSION: 1. Malpositioned Foley catheter with the inflated balloon at the level of the prostatic urethra. 2. Circumferential bladder wall thickening and some Peri ureteral stranding are worrisome for potential cystitis and ascending urinary tract infection. Intraluminal bladder gas may be related to Foley instrumentation. 3. Additional phlegmonous changes seen about the prostate compatible with prostatitis. No discernible abscess or collection. 4. Mixed interstitial  airspace opacities in the lung bases compatible with atypical infection. More nodular appearance of disease in the right lung base may warrant imaging surveillance to completion. Consider follow-up noncontrast CT in 6-8 weeks to ensure resolution. Currently attempting to contact the ordering provider and/or care team. Addendum will be submitted upon case discussion. Electronically Signed: By: Lovena Le M.D. On: 11/11/2019 02:32   CT HEAD WO CONTRAST  Result Date: 11/11/2019 CLINICAL DATA:  Neurologic deficits.  Altered mental status. EXAM: CT HEAD WITHOUT CONTRAST TECHNIQUE: Contiguous axial images were obtained from the base of the skull through the vertex without intravenous contrast. COMPARISON:  12/26/2018 FINDINGS: Brain: Ventricles, cisterns and other CSF spaces are within normal. There is no mass, mass effect, shift of midline structures or acute hemorrhage. No evidence of acute infarction. Vascular: No hyperdense vessel or unexpected calcification. Skull: Normal. Negative for fracture or focal lesion. Sinuses/Orbits: No acute finding. Other: None. IMPRESSION: No acute findings. Electronically Signed   By: Marin Olp M.D.   On: 11/11/2019 11:28   US  SCROTUM  Result Date: 11/05/2019 CLINICAL DATA:  Testicle pain EXAM: SCROTAL ULTRASOUND DOPPLER ULTRASOUND OF THE TESTICLES TECHNIQUE: Complete ultrasound examination of the testicles, epididymis, and other scrotal structures was performed. Color and spectral Doppler ultrasound were also utilized to evaluate blood flow to the testicles. COMPARISON:  None. FINDINGS: Right testicle Measurements: 3.8 x 2 x 2.9 cm. No mass or microlithiasis visualized. Left testicle Measurements:  3 x 2.2 x 2.7 cm.  No mass or microlithiasis. Right epididymis:  Normal in size and appearance. Left epididymis:  Normal in size and appearance. Hydrocele:  There are small bilateral hydroceles. Varicocele:  None visualized. Pulsed Doppler interrogation of both testes  demonstrates normal low resistance arterial and venous waveforms bilaterally. IMPRESSION: No acute abnormality detected. Electronically Signed   By: Constance Holster M.D.   On: 11/05/2019 18:49   DG CHEST PORT 1 VIEW  Result Date: 11/10/2019 CLINICAL DATA:  Sepsis. Multifocal pneumonia. History of non-Hodgkin's lymphoma. EXAM: PORTABLE CHEST 1 VIEW COMPARISON:  Radiographs 10/25/2019 and 12/26/2018. CT 02/06/2011. FINDINGS: 1656 hours. The heart size and mediastinal contours are stable. Widening of the superior mediastinum appears similar to the recent prior studies, probably reflecting mediastinal fat. There are persistent low lung volumes with patchy right greater than left perihilar and lower lobe airspace opacities, again concerning for atypical infection. No pneumothorax or significant pleural effusion. The bones appear unchanged. IMPRESSION: 1. No significant change in patchy right greater than left perihilar and lower lobe airspace opacities, concerning for atypical infection. 2. Stable mediastinal widening, likely due to mediastinal fat and suboptimal inspiration. Correlation with at least two view radiographs recommended when the patient is able. Electronically Signed   By: Richardean Sale M.D.   On: 11/10/2019 17:14    Microbiology: Recent Results (from the past 240 hour(s))  SARS CORONAVIRUS 2 (TAT 6-24 HRS) Nasopharyngeal Nasopharyngeal Swab     Status: None   Collection Time: 11/24/19 12:16 PM   Specimen: Nasopharyngeal Swab  Result Value Ref Range Status   SARS Coronavirus 2 NEGATIVE NEGATIVE Final    Comment: (NOTE) SARS-CoV-2 target nucleic acids are NOT DETECTED. The SARS-CoV-2 RNA is generally detectable in upper and lower respiratory specimens during the acute phase of infection. Negative results do not preclude SARS-CoV-2 infection, do not rule out co-infections with other pathogens, and should not be used as the sole basis for treatment or other patient management  decisions. Negative results must be combined with clinical observations, patient history, and epidemiological information. The expected result is Negative. Fact Sheet for Patients: SugarRoll.be Fact Sheet for Healthcare Providers: https://www.woods-mathews.com/ This test is not yet approved or cleared by the [place] FDA and  has been authorized for detection and/or diagnosis of SARS-CoV-2 by FDA under an Emergency Use Authorization (EUA). This EUA will remain  in effect (meaning this test can be used) for the duration of the COVID-19 declaration under Section 56 4(b)(1) of the Act, 21 U.S.C. section 360bbb-3(b)(1), unless the authorization is terminated or revoked sooner. Performed at [facility], [place] [address], [place], [place] [address]   SARS CORONAVIRUS 2 (TAT 6-24 HRS) Nasopharyngeal Nasopharyngeal Swab     Status: None   Collection Time: 11/27/19  4:25 PM   Specimen: Nasopharyngeal Swab  Result Value Ref Range Status   SARS Coronavirus 2 NEGATIVE NEGATIVE Final    Comment: (NOTE) SARS-CoV-2 target nucleic acids are NOT DETECTED. The SARS-CoV-2 RNA is generally detectable in upper and lower respiratory specimens during the acute phase of infection. Negative results  do not preclude SARS-CoV-2 infection, do not rule out co-infections with other pathogens, and should not be used as the sole basis for treatment or other patient management decisions. Negative results must be combined with clinical observations, patient history, and epidemiological information. The expected result is Negative. Fact Sheet for Patients: SugarRoll.be Fact Sheet for Healthcare Providers: https://www.woods-mathews.com/ This test is not yet approved or cleared by the [place] FDA and  has been authorized for detection and/or diagnosis of SARS-CoV-2 by FDA under an Emergency Use Authorization (EUA). This  EUA will remain  in effect (meaning this test can be used) for the duration of the COVID-19 declaration under Section 56 4(b)(1) of the Act, 21 U.S.C. section 360bbb-3(b)(1), unless the authorization is terminated or revoked sooner. Performed at [facility], [place] [address] [place], [place] [address]      Labs: Basic Metabolic Panel: Recent Labs  Lab 11/22/19 0343 11/23/19 0423 11/24/19 0538 11/25/19 0439  NA 141 138 141 140  K 4.0 4.3 4.2 4.0  CL 104 101 105 104  CO2 30 29 27 29   GLUCOSE 128* 127* 123* 144*  BUN 14 25* 25* 28*  CREATININE 1.71* 2.00* 1.93* 1.85*  CALCIUM 9.7 9.2 9.6 9.8   Liver Function Tests: Recent Labs  Lab 11/22/19 0343  AST 19  ALT 20  ALKPHOS 77  BILITOT 0.4  PROT 5.6*  ALBUMIN 2.9*   No results for input(s): LIPASE, AMYLASE in the last 168 hours. No results for input(s): AMMONIA in the last 168 hours. CBC: Recent Labs  Lab 11/22/19 0343 11/23/19 0423 11/24/19 0538 11/25/19 0439 11/28/19 1135  WBC 12.3* 13.6* 13.8* 13.2* 16.5*  NEUTROABS  --  9.9* 10.5* 9.8* 13.9*  HGB 10.8* 10.6* 10.8* 10.5* 11.2*  HCT 34.3* 33.5* 35.2* 33.8* 35.4*  MCV 98.6 98.5 101.7* 99.4 100.0  PLT 213 241 237 247 228   Cardiac Enzymes: No results for input(s): CKTOTAL, CKMB, CKMBINDEX, TROPONINI in the last 168 hours. BNP: BNP (last 3 results) No results for input(s): BNP in the last 8760 hours.  ProBNP (last 3 results) No results for input(s): PROBNP in the last 8760 hours.  CBG: No results for input(s): GLUCAP in the last 168 hours.     Signed:  Alma Friendly, MD [facility] 11/28/2019, 11:44 AM

## 2019-11-29 ENCOUNTER — Encounter (HOSPITAL_COMMUNITY): Payer: Self-pay | Admitting: Emergency Medicine

## 2019-11-29 ENCOUNTER — Other Ambulatory Visit: Payer: Self-pay

## 2019-11-29 ENCOUNTER — Emergency Department (HOSPITAL_COMMUNITY)
Admission: EM | Admit: 2019-11-29 | Discharge: 2019-11-29 | Disposition: A | Discharge status: Home / Self Care | Payer: Medicare Other | Attending: Emergency Medicine | Admitting: Emergency Medicine

## 2019-11-29 DIAGNOSIS — I509 Heart failure, unspecified: Secondary | ICD-10-CM | POA: Insufficient documentation

## 2019-11-29 DIAGNOSIS — N183 Chronic kidney disease, stage 3 unspecified: Secondary | ICD-10-CM | POA: Diagnosis not present

## 2019-11-29 DIAGNOSIS — I130 Hypertensive heart and chronic kidney disease with heart failure and stage 1 through stage 4 chronic kidney disease, or unspecified chronic kidney disease: Secondary | ICD-10-CM | POA: Insufficient documentation

## 2019-11-29 DIAGNOSIS — Z79899 Other long term (current) drug therapy: Secondary | ICD-10-CM | POA: Diagnosis not present

## 2019-11-29 DIAGNOSIS — Z7982 Long term (current) use of aspirin: Secondary | ICD-10-CM | POA: Diagnosis not present

## 2019-11-29 DIAGNOSIS — E039 Hypothyroidism, unspecified: Secondary | ICD-10-CM | POA: Diagnosis not present

## 2019-11-29 DIAGNOSIS — Z760 Encounter for issue of repeat prescription: Secondary | ICD-10-CM | POA: Insufficient documentation

## 2019-11-29 MED ORDER — ESCITALOPRAM OXALATE 10 MG PO TABS: 10.0000 mg | ORAL_TABLET | Freq: Every day | ORAL | 0 refills | Status: DC

## 2019-11-29 MED ORDER — CLONAZEPAM 1 MG PO TABS: 1.0000 mg | ORAL_TABLET | Freq: Every day | ORAL | 0 refills | Status: DC

## 2019-11-29 MED ORDER — QUETIAPINE FUMARATE 25 MG PO TABS: 25.0000 mg | ORAL_TABLET | Freq: Every day | ORAL | 0 refills | Status: DC

## 2019-11-29 MED ORDER — QUETIAPINE FUMARATE 25 MG PO TABS: 12.5000 mg | ORAL_TABLET | Freq: Two times a day (BID) | ORAL | 0 refills | Status: DC

## 2019-11-29 MED ORDER — QUETIAPINE 12.5 MG HALF TABLET
12.5000 mg | ORAL_TABLET | Freq: Every day | ORAL | Status: DC
  Filled 2019-11-29: qty 1

## 2019-11-29 MED ORDER — CLOZAPINE 25 MG PO TABS
150.0000 mg | ORAL_TABLET | Freq: Once | ORAL | Status: AC
  Administered 2019-11-29: 150 mg via ORAL
  Filled 2019-11-29: qty 2

## 2019-11-29 MED ORDER — CLONAZEPAM 1 MG PO TBDP
1.0000 mg | ORAL_TABLET | Freq: Once | ORAL | Status: DC
  Filled 2019-11-29: qty 1

## 2019-11-29 MED ORDER — ESCITALOPRAM OXALATE 10 MG PO TABS
10.0000 mg | ORAL_TABLET | Freq: Once | ORAL | Status: AC
  Administered 2019-11-29: 19:00:00 10 mg via ORAL
  Filled 2019-11-29: qty 1

## 2019-11-29 MED ORDER — CLOZAPINE 100 MG PO TABS: 100.0000 mg | ORAL_TABLET | Freq: Two times a day (BID) | ORAL | 0 refills | Status: DC

## 2019-11-29 MED ORDER — CLONAZEPAM 0.5 MG PO TBDP
1.0000 mg | ORAL_TABLET | Freq: Once | ORAL | Status: AC
  Administered 2019-11-29: 1 mg via ORAL
  Filled 2019-11-29: qty 2

## 2019-11-29 MED ORDER — CLOZAPINE 50 MG PO TABS: 50.0000 mg | ORAL_TABLET | Freq: Two times a day (BID) | ORAL | 0 refills | Status: DC

## 2019-11-29 MED ORDER — QUETIAPINE 12.5 MG HALF TABLET
12.5000 mg | ORAL_TABLET | Freq: Every day | ORAL | Status: DC
  Administered 2019-11-29: 12.5 mg via ORAL
  Filled 2019-11-29: qty 1

## 2019-11-29 NOTE — Care Management (Signed)
CM was made aware by [facility] drivers that [facility] was refusing to accept patient back at the facility. CM contacted nurse at [facility] informed her that patient had prescriptions for psych meds and would be returning by [facility] she verbalized understanding.

## 2019-11-29 NOTE — ED Triage Notes (Signed)
Pt arrives via EMS from [facility] after being sent from [facility] yesterday. Pt has not had any psych medications due to facility not having a nurse available to give these meds. Pt endorses anxiety. Denies SI or HI

## 2019-11-29 NOTE — ED Provider Notes (Signed)
Discussed with NH.  Patient needs hard script for clozapin, clonazapine, seroquel, and lexapro.    Pattricia Boss, MD 11/29/19 1640

## 2019-11-29 NOTE — Social Work (Signed)
EDCSW spoke to PTs guardian via phone to update her about PT being discharged to [facility]

## 2019-11-29 NOTE — Care Management (Signed)
ED CM discussed this patient with Dr. Jeanell Sparrow, spoke with [facility] they  are requesting hard copies of Psych medications to accept patient back to the facility. EDP updated and will evaluate patient and provide scripts.

## 2019-11-29 NOTE — Discharge Instructions (Addendum)
Please take medications as prescribed. Please follow-up with your doctor asap for hospital follow-up and medication management.

## 2019-11-29 NOTE — ED Provider Notes (Signed)
[facility] EMERGENCY DEPARTMENT Provider Note   CSN: [id number] Arrival date & time: 11/29/19  1606     History Chief Complaint  Patient presents with  . Medical Clearance    [name] is a 69 y.o. male.  Patient with history of CHF, chronic kidney disease, CLL, schizoaffective disorder/paranoid schizophrenia, recent hospitalization for Covid on 10/25/2019, discharged on 1/12 with 2 recent negative tests --brought into the hospital today because of medication problems.  Patient was discharged home on psychiatric medications including Seroquel, clozapine, ecitalopram, clonazepam.  Patient was unable to get these medications due to not having appropriate prescriptions.  No reported medical indication for visit today.  Patient is at his baseline.  Level 5 caveat due to psychiatric disorder.        Past Medical History:  Diagnosis Date  . Cellulitis   . CHF (congestive heart failure) ([place])   . Chronic kidney disease 09/20/2008  . Chronic lymphocytic leukemia ([place])   . cll dx'd 10/2003   no rx  . CLL (chronic lymphoblastic leukemia) 10/2003  . COPD (chronic obstructive pulmonary disease) ([place])   . GERD (gastroesophageal reflux disease)   . Hiatal hernia   . Hyperlipemia   . Hypertension   . Lower extremity edema   . NHL (non-Hodgkin's lymphoma) ([place]) dx'd 08/2008 rt groin   chemo comp 08/2008  . Non Hodgkin's lymphoma ([place]) 08/2008  . Non Hodgkin's lymphoma ([place])   . Paranoid schizophrenia ([place])   . Prolonged Q-T interval on ECG   . RBBB 09/17/2017  . Schizoaffective disorder ([place])   . Sepsis ([place])   . Severe left ventricular hypertrophy 10/21/2017  . Vitamin D deficiency     Patient Active Problem List   Diagnosis Date Noted  . Obstructive uropathy 11/13/2019  . Prostatitis 11/13/2019  . COVID-19 virus infection 10/25/2019  . Small bowel obstruction ([place]) 02/19/2018  . Cellulitis 12/02/2017  . Severe left ventricular hypertrophy 10/21/2017    . Stage II pressure injury of buttocks 10/03/2017  . Obesity (BMI 30-39.9) 10/03/2017  . Hyperlipidemia 10/03/2017  . Hypothyroidism 10/03/2017  . RBBB 09/17/2017  . Lobar pneumonia ([place]) 02/12/2016  . Cellulitis of right lower extremity 03/28/2015  . Sepsis secondary to UTI ([place]) 03/27/2015  . Prolonged QT interval   . CLL (chronic lymphocytic leukemia) ([place]) 09/12/2013  . Non Hodgkin's lymphoma ([place]) 09/12/2013  . Leukocytosis 12/24/2012  . Anemia 12/24/2012  . CKD (chronic kidney disease), stage III 12/24/2012  . Schizophrenia ([place]) 12/24/2012    Past Surgical History:  Procedure Laterality Date  . LYMPH NODE BIOPSY         Family History  Problem Relation Age of Onset  . Alcohol abuse Father     Social History   Tobacco Use  . Smoking status: Never Smoker  . Smokeless tobacco: Never Used  Substance Use Topics  . Alcohol use: No  . Drug use: No    Home Medications Prior to Admission medications   Medication Sig Start Date End Date Taking? Authorizing Provider  acetaminophen (TYLENOL) 500 MG tablet Take 1,000 mg by mouth every 6 (six) hours as needed for mild pain.    [provider]  ascorbic acid (VITAMIN C) 500 MG tablet Take 1 tablet (500 mg total) by mouth daily. 11/29/19   Alma Friendly, MD  aspirin EC 81 MG tablet Take 81 mg by mouth daily.    [provider]  atorvastatin (LIPITOR) 20 MG tablet Take 1 tablet (20 mg  total) by mouth daily. 10/03/18   Skeet Latch, MD  benztropine (COGENTIN) 0.5 MG tablet Take 1 tablet (0.5 mg total) by mouth 2 (two) times daily. 07/20/18   Doreatha Lew, MD  Cholecalciferol (DIALYVITE VITAMIN D 5000) 125 MCG (5000 UT) capsule Take 5,000 Units by mouth daily.    [provider]  clonazePAM (KLONOPIN) 1 MG tablet Take 1 tablet (1 mg total) by mouth at bedtime. 11/29/19   Carlisle Cater, PA-C  clotrimazole (LOTRIMIN) 1 % cream Apply topically 2 (two) times daily. 11/28/19   Alma Friendly, MD  cloZAPine (CLOZARIL) 100 MG tablet Take 1 tablet (100 mg total) by mouth 2 (two) times daily. 11/29/19   Carlisle Cater, PA-C  clozapine (CLOZARIL) 50 MG tablet Take 1 tablet (50 mg total) by mouth 2 (two) times daily. 11/29/19   Carlisle Cater, PA-C  docusate sodium (COLACE) 100 MG capsule Take 100 mg by mouth daily.    [provider]  escitalopram (LEXAPRO) 10 MG tablet Take 1 tablet (10 mg total) by mouth daily before breakfast. 11/29/19   Carlisle Cater, PA-C  feeding supplement, ENSURE ENLIVE, (ENSURE ENLIVE) LIQD Take 237 mLs by mouth 2 (two) times daily between meals. 11/28/19   Alma Friendly, MD  fluticasone (FLOVENT HFA) 220 MCG/ACT inhaler Inhale 1 puff into the lungs 2 (two) times daily. 11/28/19   Alma Friendly, MD  folic acid (FOLVITE) 1 MG tablet Take 1 tablet (1 mg total) by mouth daily. 11/29/19 12/29/19  Alma Friendly, MD  Ipratropium-Albuterol (COMBIVENT) 20-100 MCG/ACT AERS respimat Inhale 1 puff into the lungs every 6 (six) hours as needed for wheezing. 11/28/19   Alma Friendly, MD  levothyroxine (SYNTHROID, LEVOTHROID) 100 MCG tablet Take 100 mcg daily by mouth.     [provider]  Multiple Vitamin (MULTIVITAMIN WITH MINERALS) TABS tablet Take 1 tablet by mouth daily. 11/29/19   Alma Friendly, MD  nystatin (MYCOSTATIN/NYSTOP) powder Apply topically 4 (four) times daily. Patient taking differently: Apply 1 Bottle topically daily. Apply to buttocks daily. 12/31/18   Blanchie Dessert, MD  Omega-3 Fatty Acids (FISH OIL) 1000 MG CAPS Take 4,000 mg by mouth daily.    [provider]  polyethylene glycol (MIRALAX / GLYCOLAX) packet Take 17 g daily by mouth. Dissolve 17g in 8oz of water/juice and drink by mouth every day    [provider]  QUEtiapine (SEROQUEL) 25 MG tablet Take 0.5 tablets (12.5 mg total) by mouth 2 (two) times daily. 11/29/19   Carlisle Cater, PA-C  QUEtiapine (SEROQUEL) 25 MG tablet Take 1  tablet (25 mg total) by mouth at bedtime. 11/29/19   Carlisle Cater, PA-C  senna (SENOKOT) 8.6 MG TABS tablet Take 1 tablet by mouth daily.    [provider]  thiamine 100 MG tablet Take 1 tablet (100 mg total) by mouth daily. 11/29/19   Alma Friendly, MD  zinc sulfate 220 (50 Zn) MG capsule Take 1 capsule (220 mg total) by mouth daily. 11/29/19   Alma Friendly, MD    Allergies    Sulfamethoxazole  Review of Systems   Review of Systems  Unable to perform ROS: Psychiatric disorder    Physical Exam Updated Vital Signs BP 93/70 (BP Location: Left Arm)   Pulse (!) 107   Temp 98.3 F (36.8 C) (Oral)   Resp 20   SpO2 100%   Physical Exam Vitals and nursing note reviewed.  Constitutional:      Appearance: He  is well-developed.  HENT:     Head: Normocephalic and atraumatic.  Eyes:     Conjunctiva/sclera: Conjunctivae normal.  Cardiovascular:     Rate and Rhythm: Normal rate.  Pulmonary:     Effort: No respiratory distress.     Breath sounds: No wheezing, rhonchi or rales.  Abdominal:     Tenderness: There is no abdominal tenderness. There is no guarding or rebound.  Musculoskeletal:     Cervical back: Normal range of motion and neck supple.  Skin:    General: Skin is warm and dry.  Neurological:     Mental Status: He is alert.     ED Results / Procedures / Treatments   Labs (all labs ordered are listed, but only abnormal results are displayed) Labs Reviewed - No data to display  EKG None  Radiology No results found.  Procedures Procedures (including critical care time)  Medications Ordered in ED Medications  cloZAPine (CLOZARIL) tablet 150 mg (has no administration in time range)  escitalopram (LEXAPRO) tablet 10 mg (has no administration in time range)  QUEtiapine (SEROQUEL) tablet 12.5 mg (has no administration in time range)  clonazePAM (KLONOPIN) disintegrating tablet 1 mg (1 mg Oral Given 11/29/19 1746)    ED Course  I have  reviewed the triage vital signs and the nursing notes.  Pertinent labs & imaging results that were available during my care of the patient were reviewed by me and considered in my medical decision making (see chart for details).  Patient seen and examined.  I reviewed previous hospitalization notes and labs.  I reviewed the patient's medication list.  I spoke with Dr. Jeanell Sparrow and Dr. Hulen Skains who have both been in contact with the nursing facility to confirm patient's needs.  ED nurse case manager is also aware of the patient.  He needs prescriptions for quetiapine, clozapine, clonazepam, escitalopram.  Patient was ordered his usual dose here in the emergency department and will be provided with signed prescriptions.  These prescriptions are based on what the patient currently has active in his med list, most recently confirmed yesterday, and what he was being provided in the hospital.  Patient looks well, nontoxic.  He denies any complaints except that his neck hurts.  Vital signs reviewed and are as follows: BP 93/70 (BP Location: Left Arm)   Pulse (!) 107   Temp 98.3 F (36.8 C) (Oral)   Resp 20   SpO2 100%   6:43 PM Patient has taken his meds. Ready for d/c.      MDM Rules/Calculators/A&P                      Patient with recent protracted hospital stay, here for medications.  He has received his medications as prescribed and to be discharged home with hard copies of prescriptions per request of facility.   Final Clinical Impression(s) / ED Diagnoses Final diagnoses:  Encounter for medication refill    Rx / DC Orders ED Discharge Orders         Ordered    clonazePAM (KLONOPIN) 1 MG tablet  Daily at bedtime     11/29/19 1713    cloZAPine (CLOZARIL) 100 MG tablet  2 times daily     11/29/19 1713    clozapine (CLOZARIL) 50 MG tablet  2 times daily     11/29/19 1713    escitalopram (LEXAPRO) 10 MG tablet  Daily before breakfast     11/29/19 1713    QUEtiapine (SEROQUEL)  25 MG tablet   2 times daily     11/29/19 1713    QUEtiapine (SEROQUEL) 25 MG tablet  Daily at bedtime     11/29/19 1713           Carlisle Cater, PA-C 11/29/19 1844    Charlesetta Shanks, MD 12/01/19 2005

## 2019-11-29 NOTE — ED Notes (Signed)
Pt. continues to get up, and unsteady on his feet. This tech has placed pt on a recliner, and has him seated next to the tech.

## 2019-11-29 NOTE — ED Notes (Signed)
PTAR called @1449 -per Clydene Fake, RN called by Levada Dy

## 2020-01-26 ENCOUNTER — Other Ambulatory Visit: Payer: Self-pay

## 2020-01-26 ENCOUNTER — Emergency Department (HOSPITAL_COMMUNITY)
Admission: EM | Admit: 2020-01-26 | Discharge: 2020-01-29 | Disposition: A | Discharge status: Skilled Nursing Facility | Payer: Medicare Other | Attending: Emergency Medicine | Admitting: Emergency Medicine

## 2020-01-26 DIAGNOSIS — Z79899 Other long term (current) drug therapy: Secondary | ICD-10-CM | POA: Insufficient documentation

## 2020-01-26 DIAGNOSIS — R4689 Other symptoms and signs involving appearance and behavior: Secondary | ICD-10-CM

## 2020-01-26 DIAGNOSIS — I509 Heart failure, unspecified: Secondary | ICD-10-CM | POA: Diagnosis not present

## 2020-01-26 DIAGNOSIS — R451 Restlessness and agitation: Secondary | ICD-10-CM | POA: Insufficient documentation

## 2020-01-26 DIAGNOSIS — F203 Undifferentiated schizophrenia: Secondary | ICD-10-CM | POA: Diagnosis not present

## 2020-01-26 DIAGNOSIS — N183 Chronic kidney disease, stage 3 unspecified: Secondary | ICD-10-CM | POA: Insufficient documentation

## 2020-01-26 DIAGNOSIS — F209 Schizophrenia, unspecified: Secondary | ICD-10-CM | POA: Insufficient documentation

## 2020-01-26 DIAGNOSIS — Z20822 Contact with and (suspected) exposure to covid-19: Secondary | ICD-10-CM | POA: Insufficient documentation

## 2020-01-26 DIAGNOSIS — Z8616 Personal history of COVID-19: Secondary | ICD-10-CM | POA: Diagnosis not present

## 2020-01-26 DIAGNOSIS — I130 Hypertensive heart and chronic kidney disease with heart failure and stage 1 through stage 4 chronic kidney disease, or unspecified chronic kidney disease: Secondary | ICD-10-CM | POA: Diagnosis not present

## 2020-01-26 NOTE — ED Triage Notes (Signed)
Pt is a resident of [facility], states that he has been off of his meds x 4 days.

## 2020-01-26 NOTE — ED Notes (Signed)
Pt tx from [facility], has been off of his med  X 4 days.

## 2020-01-27 DIAGNOSIS — F203 Undifferentiated schizophrenia: Secondary | ICD-10-CM

## 2020-01-27 DIAGNOSIS — F209 Schizophrenia, unspecified: Secondary | ICD-10-CM | POA: Diagnosis not present

## 2020-01-27 LAB — CBC WITH DIFFERENTIAL/PLATELET
Abs Immature Granulocytes: 0.03 10*3/uL (ref 0.00–0.07)
Basophils Absolute: 0.1 10*3/uL (ref 0.0–0.1)
Basophils Relative: 0 %
Eosinophils Absolute: 0.2 10*3/uL (ref 0.0–0.5)
Eosinophils Relative: 2 %
HCT: 41.3 % (ref 39.0–52.0)
Hemoglobin: 12.8 g/dL — ABNORMAL LOW (ref 13.0–17.0)
Immature Granulocytes: 0 %
Lymphocytes Relative: 11 %
Lymphs Abs: 1.4 10*3/uL (ref 0.7–4.0)
MCH: 30.2 pg (ref 26.0–34.0)
MCHC: 31 g/dL (ref 30.0–36.0)
MCV: 97.4 fL (ref 80.0–100.0)
Monocytes Absolute: 0.8 10*3/uL (ref 0.1–1.0)
Monocytes Relative: 6 %
Neutro Abs: 10.5 10*3/uL — ABNORMAL HIGH (ref 1.7–7.7)
Neutrophils Relative %: 81 %
Platelets: 157 10*3/uL (ref 150–400)
RBC: 4.24 MIL/uL (ref 4.22–5.81)
RDW: 13.4 % (ref 11.5–15.5)
WBC: 13 10*3/uL — ABNORMAL HIGH (ref 4.0–10.5)
nRBC: 0 % (ref 0.0–0.2)

## 2020-01-27 LAB — COMPREHENSIVE METABOLIC PANEL
ALT: 15 U/L (ref 0–44)
AST: 16 U/L (ref 15–41)
Albumin: 3.5 g/dL (ref 3.5–5.0)
Alkaline Phosphatase: 62 U/L (ref 38–126)
Anion gap: 11 (ref 5–15)
BUN: 23 mg/dL (ref 8–23)
CO2: 23 mmol/L (ref 22–32)
Calcium: 9.6 mg/dL (ref 8.9–10.3)
Chloride: 105 mmol/L (ref 98–111)
Creatinine, Ser: 1.59 mg/dL — ABNORMAL HIGH (ref 0.61–1.24)
GFR calc Af Amer: 52 mL/min — ABNORMAL LOW (ref >60)
GFR calc non Af Amer: 45 mL/min — ABNORMAL LOW (ref >60)
Glucose, Bld: 150 mg/dL — ABNORMAL HIGH (ref 70–99)
Potassium: 3.5 mmol/L (ref 3.5–5.1)
Sodium: 139 mmol/L (ref 135–145)
Total Bilirubin: 0.4 mg/dL (ref 0.3–1.2)
Total Protein: 6 g/dL — ABNORMAL LOW (ref 6.5–8.1)

## 2020-01-27 MED ORDER — CLOZAPINE 25 MG PO TABS
50.0000 mg | ORAL_TABLET | Freq: Once | ORAL | Status: DC
  Administered 2020-01-27: 50 mg via ORAL
  Filled 2020-01-27: qty 2

## 2020-01-27 MED ORDER — BREXPIPRAZOLE 2 MG PO TABS
2.0000 mg | ORAL_TABLET | Freq: Every day | ORAL | Status: DC
  Administered 2020-01-27 – 2020-01-29 (×3): 2 mg via ORAL
  Filled 2020-01-27 (×3): qty 1

## 2020-01-27 MED ORDER — GABAPENTIN 300 MG PO CAPS
300.0000 mg | ORAL_CAPSULE | Freq: Two times a day (BID) | ORAL | Status: DC
  Administered 2020-01-27 (×2): 300 mg via ORAL
  Filled 2020-01-27 (×2): qty 1

## 2020-01-27 MED ORDER — BREXPIPRAZOLE 2 MG PO TABS: 2.0000 mg | ORAL_TABLET | Freq: Every day | ORAL | Status: DC

## 2020-01-27 MED ORDER — CLONAZEPAM 1 MG PO TABS
1.0000 mg | ORAL_TABLET | Freq: Once | ORAL | Status: AC
  Administered 2020-01-27: 1 mg via ORAL
  Filled 2020-01-27: qty 1

## 2020-01-27 MED ORDER — QUETIAPINE FUMARATE 25 MG PO TABS
25.0000 mg | ORAL_TABLET | Freq: Every day | ORAL | Status: DC
  Administered 2020-01-27: 25 mg via ORAL
  Filled 2020-01-27: qty 1

## 2020-01-27 NOTE — ED Notes (Signed)
Pt drowsy this shift. Pt slow to respond. Pt confused.  Pt cooperative with care and medication. No aggression noted.

## 2020-01-27 NOTE — Progress Notes (Addendum)
Per Merlyn Lot, NP patient meets inpatient criteria. Patient has been faxed out to the following facilities for review:     [facility]         [facility]         [facility]         [facility]         [facility]         [place], MSW, LCSW-A Transitions of Care  Clinical Social Worker  [facility] Emergency Departments  Medical ICU [phone]
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
## 2020-01-27 NOTE — ED Notes (Signed)
Pt resting comfortably . Pt confused, disorganized, rambling, flight of ideas.  Pt sat up to eat dinner.

## 2020-01-27 NOTE — ED Notes (Signed)
Just spoke with the pt's nurse at [facility] rehab center. States that the pt has had increase aggression towards the staff, and refusing to take meds. Told the writer that the pt has been SI/ HI today.

## 2020-01-27 NOTE — ED Notes (Signed)
Pt resting comfortably in bed

## 2020-01-27 NOTE — ED Provider Notes (Signed)
Emergency Department Provider Note   I have reviewed the triage vital signs and the nursing notes.   HISTORY  Chief Complaint Medical Clearance   HPI [name] is a 69 y.o. male who presents the emerge from today for unclear reasons it sounds like the patient has not has medications in 4 days even though he is in assisted living facility.  They noticed that he was acting more aggressive and agitated than normal so sent him here for medical clearance.  Patient denies any suicidal homicidal ideations she states that he feels unwell and nervous.  No other medical complaints.  Review the records he recently admitted for Covid and was discharged on oxygen and with his oxygen here his vital signs are normal.  He did have some prior to arrival vital signs that were abnormal I suspect this is without his oxygen.   No other associated or modifying symptoms.    Past Medical History:  Diagnosis Date  . Cellulitis   . CHF (congestive heart failure) ([place])   . Chronic kidney disease 09/20/2008  . Chronic lymphocytic leukemia ([place])   . cll dx'd 10/2003   no rx  . CLL (chronic lymphoblastic leukemia) 10/2003  . COPD (chronic obstructive pulmonary disease) ([place])   . GERD (gastroesophageal reflux disease)   . Hiatal hernia   . Hyperlipemia   . Hypertension   . Lower extremity edema   . NHL (non-Hodgkin's lymphoma) ([place]) dx'd 08/2008 rt groin   chemo comp 08/2008  . Non Hodgkin's lymphoma ([place]) 08/2008  . Non Hodgkin's lymphoma ([place])   . Paranoid schizophrenia ([place])   . Prolonged Q-T interval on ECG   . RBBB 09/17/2017  . Schizoaffective disorder ([place])   . Sepsis ([place])   . Severe left ventricular hypertrophy 10/21/2017  . Vitamin D deficiency     Patient Active Problem List   Diagnosis Date Noted  . Obstructive uropathy 11/13/2019  . Prostatitis 11/13/2019  . COVID-19 virus infection 10/25/2019  . Small bowel obstruction ([place]) 02/19/2018  . Cellulitis 12/02/2017  . Severe  left ventricular hypertrophy 10/21/2017  . Stage II pressure injury of buttocks 10/03/2017  . Obesity (BMI 30-39.9) 10/03/2017  . Hyperlipidemia 10/03/2017  . Hypothyroidism 10/03/2017  . RBBB 09/17/2017  . Lobar pneumonia ([place]) 02/12/2016  . Cellulitis of right lower extremity 03/28/2015  . Sepsis secondary to UTI ([place]) 03/27/2015  . Prolonged QT interval   . CLL (chronic lymphocytic leukemia) ([place]) 09/12/2013  . Non Hodgkin's lymphoma ([place]) 09/12/2013  . Leukocytosis 12/24/2012  . Anemia 12/24/2012  . CKD (chronic kidney disease), stage III 12/24/2012  . Schizophrenia ([place]) 12/24/2012    Past Surgical History:  Procedure Laterality Date  . LYMPH NODE BIOPSY      Current Outpatient Rx  . Order #: [id number] Class: Historical Med  . Order #: [id number] Class: No Print  . Order #: [id number] Class: Historical Med  . Order #: [id number] Class: Print  . Order #: [id number] Class: No Print  . Order #: [id number] Class: Historical Med  . Order #: [id number] Class: Print  . Order #: [id number] Class: No Print  . Order #: [id number] Class: Print  . Order #: [id number] Class: Print  . Order #: [id number] Class: Historical Med  . Order #: [id number] Class: Print  . Order #: [id number] Class: No Print  . Order #: [id number] Class: No Print  . Order #: [id number] Class: No Print  . Order #: [id number] Class: Historical Med  . Order #: [id number] Class: No Print  . Order #: [id number] Class: Print  . Order #: [id number] Class:  Historical Med  . Order #: [id number] Class: Historical Med  . Order #: [id number] Class: Print  . Order #: [id number] Class: Print  . Order #: [id number] Class: Historical Med  . Order #: [id number] Class: No Print  . Order #: [id number] Class: No Print    Allergies Sulfamethoxazole  Family History  Problem Relation Age of Onset  . Alcohol abuse Father     Social History Social History   Tobacco Use  . Smoking status: Never Smoker  . Smokeless tobacco: Never Used  Substance Use Topics  .  Alcohol use: No  . Drug use: No    Review of Systems  All other systems negative except as documented in the HPI. All pertinent positives and negatives as reviewed in the HPI. ____________________________________________   PHYSICAL EXAM:  VITAL SIGNS: ED Triage Vitals  Enc Vitals Group     BP 01/26/20 2226 137/75     Pulse Rate 01/26/20 2226 (!) 112     Resp 01/26/20 2226 16     Temp 01/26/20 2226 98.4 F (36.9 C)     Temp Source 01/26/20 2226 Oral     SpO2 01/26/20 2222 (!) 89 %     Weight 01/26/20 2227 249 lb 1.9 oz (113 kg)     Height 01/26/20 2227 6' (1.829 m)    Constitutional: Alert and oriented. Well appearing and in no acute distress. Eyes: Conjunctivae are normal. PERRL. EOMI. Head: Atraumatic. Nose: No congestion/rhinnorhea. Mouth/Throat: Mucous membranes are moist.  Oropharynx non-erythematous. Neck: No stridor.  No meningeal signs.   Cardiovascular: Normal rate, regular rhythm. Good peripheral circulation. Grossly normal heart sounds.   Respiratory: Normal respiratory effort.  No retractions. Lungs CTAB. Gastrointestinal: Soft and nontender. No distention.  Musculoskeletal: No lower extremity tenderness nor edema. No gross deformities of extremities. Neurologic:  Normal speech and language. No gross focal neurologic deficits are appreciated.  Skin:  Skin is warm, dry and intact. No rash noted.  ____________________________________________   LABS (all labs ordered are listed, but only abnormal results are displayed)  Labs Reviewed  CBC WITH DIFFERENTIAL/PLATELET   ____________________________________________  EKG   EKG Interpretation  Date/Time:    Ventricular Rate:    PR Interval:    QRS Duration:   QT Interval:    QTC Calculation:   R Axis:     Text Interpretation:         ____________________________________________  RADIOLOGY  No results found.  ____________________________________________   PROCEDURES  Procedure(s) performed:    Procedures   ____________________________________________   INITIAL IMPRESSION / ASSESSMENT AND PLAN / ED COURSE  We will administer medications in office for safety.  If this is fine he does act out overnight and was seen he recently needs to be hospitalized.   Clinical Course as of Jan 27 732  Sat Jan 27, 2020  0210 Nurse discussed with facility and apparently patient made suicidal and homicidal threats in their presence. Will add on cmp and consult TTS.    [JM]    Clinical Course User Index [JM] Mystie Ormand, Corene Cornea, MD   Still no labs collected. Nursing has been notified multiple times in reference to same.  Care transferred pending lab collection and tts consult.   Pertinent labs & imaging results that were available during my care of the patient were reviewed by me and considered in my medical decision making (see chart for details). ____________________________________________  FINAL CLINICAL IMPRESSION(S) / ED DIAGNOSES  Final diagnoses:  None     MEDICATIONS GIVEN DURING THIS VISIT:  Medications  cloZAPine (CLOZARIL) tablet 50 mg (has no administration in time range)  clonazePAM (KLONOPIN) tablet 1 mg (has no administration in time range)  QUEtiapine (SEROQUEL) tablet 25 mg (has no administration in time range)     NEW OUTPATIENT MEDICATIONS STARTED DURING THIS VISIT:  New Prescriptions   No medications on file    Note:  This note was prepared with assistance of Dragon voice recognition software. Occasional wrong-word or sound-a-like substitutions may have occurred due to the inherent limitations of voice recognition software.   Refoel Palladino, Corene Cornea, MD 01/27/20 [phone]

## 2020-01-27 NOTE — Consult Note (Addendum)
Tele Assessment [name], 69 y.o., male patient presented to [facility].  Patient seen via telepsych by this provider; chart reviewed and consulted with Dr. [name] on 01/27/20.  On evaluation [name] presents very lethargic,has difficulty rendering his story and provides fragmented answers to questions.  He states he previously took medication for "a nervous problem" and stopped taking them because he did not like the way it made him feel.  The patient continues to meet inpatient criteria, recommend geri psych and social work notified.  In the interim will make the following medication adjustments. EKG demonstrates increased QT intervals and the patient is taking clozaril and quetiapine.   Stop the following medications due to QTc prolongation:  Clozaril  Seroquel   Start the following medications:  Brexpiprazole 2mg  po daily for schizophrenia, titrate up at weekly intervals  Gabepentin 300mg  po BID for aggressive behaviors  Spoke with EDP; informed of above recommendation and disposition   [name], PMHNP

## 2020-01-28 DIAGNOSIS — F209 Schizophrenia, unspecified: Secondary | ICD-10-CM | POA: Diagnosis not present

## 2020-01-28 LAB — URINALYSIS, ROUTINE W REFLEX MICROSCOPIC
Bilirubin Urine: NEGATIVE
Glucose, UA: NEGATIVE mg/dL
Hgb urine dipstick: NEGATIVE
Ketones, ur: NEGATIVE mg/dL
Nitrite: POSITIVE — AB
Protein, ur: NEGATIVE mg/dL
Specific Gravity, Urine: 1.017 (ref 1.005–1.030)
WBC, UA: >50 WBC/hpf — ABNORMAL HIGH (ref 0–5)
pH: 5 (ref 5.0–8.0)

## 2020-01-28 MED ORDER — GABAPENTIN 400 MG PO CAPS
400.0000 mg | ORAL_CAPSULE | Freq: Two times a day (BID) | ORAL | Status: DC
  Administered 2020-01-28 – 2020-01-29 (×3): 400 mg via ORAL
  Filled 2020-01-28 (×3): qty 1

## 2020-01-28 NOTE — Consult Note (Addendum)
[facility] Psych ED Progress Note  01/28/2020 4:48 PM [name]  MRN:  [id number] Subjective:  "Are you from [facility] or [place]?" Principal Problem: Schizophrenia ([place]) Diagnosis:  Principal Problem:   Schizophrenia ([place])  Total Time spent with patient: 15 minutes  Past Psychiatric History: as outlined below  Past Medical History:  Past Medical History:  Diagnosis Date  . Cellulitis   . CHF (congestive heart failure) ([place])   . Chronic kidney disease 09/20/2008  . Chronic lymphocytic leukemia ([place])   . cll dx'd 10/2003   no rx  . CLL (chronic lymphoblastic leukemia) 10/2003  . COPD (chronic obstructive pulmonary disease) ([place])   . GERD (gastroesophageal reflux disease)   . Hiatal hernia   . Hyperlipemia   . Hypertension   . Lower extremity edema   . NHL (non-Hodgkin's lymphoma) ([place]) dx'd 08/2008 rt groin   chemo comp 08/2008  . Non Hodgkin's lymphoma ([place]) 08/2008  . Non Hodgkin's lymphoma ([place])   . Paranoid schizophrenia ([place])   . Prolonged Q-T interval on ECG   . RBBB 09/17/2017  . Schizoaffective disorder ([place])   . Sepsis ([place])   . Severe left ventricular hypertrophy 10/21/2017  . Vitamin D deficiency     Past Surgical History:  Procedure Laterality Date  . LYMPH NODE BIOPSY     Family History:  Family History  Problem Relation Age of Onset  . Alcohol abuse Father    Family Psychiatric  History: unknwon Social History:  Social History   Substance and Sexual Activity  Alcohol Use No     Social History   Substance and Sexual Activity  Drug Use No    Social History   Socioeconomic History  . Marital status: Single    Spouse name: Not on file  . Number of children: Not on file  . Years of education: Not on file  . Highest education level: Not on file  Occupational History  . Not on file  Tobacco Use  . Smoking status: Never Smoker  . Smokeless tobacco: Never Used  Substance and Sexual Activity  . Alcohol use: No  . Drug use: No  . Sexual  activity: Never  Other Topics Concern  . Not on file  Social History Narrative  . Not on file   Social Determinants of Health   Financial Resource Strain:   . Difficulty of Paying Living Expenses:   Food Insecurity:   . Worried About Charity fundraiser in the Last Year:   . Arboriculturist in the Last Year:   Transportation Needs:   . Film/video editor (Medical):   Marland Kitchen Lack of Transportation (Non-Medical):   Physical Activity:   . Days of Exercise per Week:   . Minutes of Exercise per Session:   Stress:   . Feeling of Stress :   Social Connections:   . Frequency of Communication with Friends and Family:   . Frequency of Social Gatherings with Friends and Family:   . Attends Religious Services:   . Active Member of Clubs or Organizations:   . Attends Archivist Meetings:   Marland Kitchen Marital Status:     Sleep: Fair  Appetite:  Good  Current Medications: Current Facility-Administered Medications  Medication Dose Route Frequency Provider Last Rate Last Admin  . brexpiprazole (REXULTI) tablet 2 mg  2 mg Oral Daily Nerea Bordenave, MD   2 mg at 01/28/20 1057  . gabapentin (NEURONTIN) capsule 400 mg  400 mg Oral BID  Corena Pilgrim, MD   400 mg at 01/28/20 1121   Current Outpatient Medications  Medication Sig Dispense Refill  . acetaminophen (TYLENOL) 500 MG tablet Take 1,000 mg by mouth every 6 (six) hours as needed for mild pain.    Marland Kitchen ascorbic acid (VITAMIN C) 500 MG tablet Take 1 tablet (500 mg total) by mouth daily.    Marland Kitchen aspirin EC 81 MG tablet Take 81 mg by mouth daily.    Marland Kitchen atorvastatin (LIPITOR) 20 MG tablet Take 1 tablet (20 mg total) by mouth daily. 30 tablet 5  . benztropine (COGENTIN) 0.5 MG tablet Take 1 tablet (0.5 mg total) by mouth 2 (two) times daily.    . Cholecalciferol (DIALYVITE VITAMIN D 5000) 125 MCG (5000 UT) capsule Take 5,000 Units by mouth daily.    . clonazePAM (KLONOPIN) 1 MG tablet Take 1 tablet (1 mg total) by mouth at bedtime. 30 tablet  0  . cloZAPine (CLOZARIL) 100 MG tablet Take 1 tablet (100 mg total) by mouth 2 (two) times daily. 60 tablet 0  . clozapine (CLOZARIL) 50 MG tablet Take 1 tablet (50 mg total) by mouth 2 (two) times daily. 60 tablet 0  . docusate sodium (COLACE) 100 MG capsule Take 100 mg by mouth daily.    Marland Kitchen escitalopram (LEXAPRO) 10 MG tablet Take 1 tablet (10 mg total) by mouth daily before breakfast. 30 tablet 0  . fluticasone (FLOVENT HFA) 220 MCG/ACT inhaler Inhale 1 puff into the lungs 2 (two) times daily. 1 Inhaler 12  . Ipratropium-Albuterol (COMBIVENT) 20-100 MCG/ACT AERS respimat Inhale 1 puff into the lungs every 6 (six) hours as needed for wheezing.    Marland Kitchen levothyroxine (SYNTHROID, LEVOTHROID) 100 MCG tablet Take 100 mcg daily by mouth.     . Multiple Vitamin (MULTIVITAMIN WITH MINERALS) TABS tablet Take 1 tablet by mouth daily.    . Omega-3 Fatty Acids (FISH OIL) 1000 MG CAPS Take 4,000 mg by mouth daily.    . polyethylene glycol (MIRALAX / GLYCOLAX) packet Take 17 g daily by mouth. Dissolve 17g in 8oz of water/juice and drink by mouth every day    . QUEtiapine (SEROQUEL) 25 MG tablet Take 0.5 tablets (12.5 mg total) by mouth 2 (two) times daily. 30 tablet 0  . QUEtiapine (SEROQUEL) 25 MG tablet Take 1 tablet (25 mg total) by mouth at bedtime. 30 tablet 0  . senna (SENOKOT) 8.6 MG TABS tablet Take 1 tablet by mouth daily.    Marland Kitchen thiamine 100 MG tablet Take 1 tablet (100 mg total) by mouth daily.    Marland Kitchen zinc sulfate 220 (50 Zn) MG capsule Take 1 capsule (220 mg total) by mouth daily.    . clotrimazole (LOTRIMIN) 1 % cream Apply topically 2 (two) times daily. (Patient not taking: Reported on 01/27/2020) 30 g 0  . feeding supplement, ENSURE ENLIVE, (ENSURE ENLIVE) LIQD Take 237 mLs by mouth 2 (two) times daily between meals. (Patient not taking: Reported on 01/27/2020) 237 mL 12  . nystatin (MYCOSTATIN/NYSTOP) powder Apply topically 4 (four) times daily. (Patient not taking: Reported on 01/27/2020) 15 g 0     Lab Results:  Results for orders placed or performed during the hospital encounter of 01/26/20 (from the past 48 hour(s))  CBC with Differential/Platelet     Status: Abnormal   Collection Time: 01/27/20  9:21 AM  Result Value Ref Range   WBC 13.0 (H) 4.0 - 10.5 K/uL   RBC 4.24 4.22 - 5.81 MIL/uL   Hemoglobin 12.8 (L)  13.0 - 17.0 g/dL   HCT 41.3 39.0 - 52.0 %   MCV 97.4 80.0 - 100.0 fL   MCH 30.2 26.0 - 34.0 pg   MCHC 31.0 30.0 - 36.0 g/dL   RDW 13.4 11.5 - 15.5 %   Platelets 157 150 - 400 K/uL   nRBC 0.0 0.0 - 0.2 %   Neutrophils Relative % 81 %   Neutro Abs 10.5 (H) 1.7 - 7.7 K/uL   Lymphocytes Relative 11 %   Lymphs Abs 1.4 0.7 - 4.0 K/uL   Monocytes Relative 6 %   Monocytes Absolute 0.8 0.1 - 1.0 K/uL   Eosinophils Relative 2 %   Eosinophils Absolute 0.2 0.0 - 0.5 K/uL   Basophils Relative 0 %   Basophils Absolute 0.1 0.0 - 0.1 K/uL   Immature Granulocytes 0 %   Abs Immature Granulocytes 0.03 0.00 - 0.07 K/uL    Comment: Performed at [facility], [place] [address] [place], [place] [address]  Comprehensive metabolic panel     Status: Abnormal   Collection Time: 01/27/20  9:21 AM  Result Value Ref Range   Sodium 139 135 - 145 mmol/L   Potassium 3.5 3.5 - 5.1 mmol/L   Chloride 105 98 - 111 mmol/L   CO2 23 22 - 32 mmol/L   Glucose, Bld 150 (H) 70 - 99 mg/dL    Comment: Glucose reference range applies only to samples taken after fasting for at least 8 hours.   BUN 23 8 - 23 mg/dL   Creatinine, Ser 1.59 (H) 0.61 - 1.24 mg/dL   Calcium 9.6 8.9 - 10.3 mg/dL   Total Protein 6.0 (L) 6.5 - 8.1 g/dL   Albumin 3.5 3.5 - 5.0 g/dL   AST 16 15 - 41 U/L   ALT 15 0 - 44 U/L   Alkaline Phosphatase 62 38 - 126 U/L   Total Bilirubin 0.4 0.3 - 1.2 mg/dL   GFR calc non Af Amer 45 (L) >60 mL/min   GFR calc Af Amer 52 (L) >60 mL/min   Anion gap 11 5 - 15    Comment: Performed at [facility], [place] [address] [place], [place] [address]   Urinalysis, Routine w reflex microscopic     Status: Abnormal   Collection Time: 01/28/20  5:02 AM  Result Value Ref Range   Color, Urine YELLOW YELLOW   APPearance HAZY (A) CLEAR   Specific Gravity, Urine 1.017 1.005 - 1.030   pH 5.0 5.0 - 8.0   Glucose, UA NEGATIVE NEGATIVE mg/dL   Hgb urine dipstick NEGATIVE NEGATIVE   Bilirubin Urine NEGATIVE NEGATIVE   Ketones, ur NEGATIVE NEGATIVE mg/dL   Protein, ur NEGATIVE NEGATIVE mg/dL   Nitrite POSITIVE (A) NEGATIVE   Leukocytes,Ua LARGE (A) NEGATIVE   RBC / HPF 11-20 0 - 5 RBC/hpf   WBC, UA >50 (H) 0 - 5 WBC/hpf   Bacteria, UA RARE (A) NONE SEEN   Squamous Epithelial / LPF 0-5 0 - 5   Mucus PRESENT    Hyaline Casts, UA PRESENT     Comment: Performed at [facility], [place] [address], [place], [place] [address]    Blood Alcohol level:  Lab Results  Component Value Date   ETH <10 10/25/2019   ETH <5 05/15/2015    Physical Findings: AIMS:  , ,  ,  ,    CIWA:    COWS:     Musculoskeletal: Unable to assess, patient lying in  bed  Psychiatric Specialty Exam: Physical Exam Cardiovascular:     Rate and Rhythm: Normal rate.  Musculoskeletal:     Cervical back: Normal range of motion.  Neurological:     Mental Status: He is alert.     Review of Systems  Psychiatric/Behavioral: Positive for agitation, confusion and dysphoric mood.    Blood pressure 120/64, pulse 79, temperature 98 F (36.7 C), temperature source Oral, resp. rate 18, height 6' (1.829 m), weight 113 kg, SpO2 98 %.Body mass index is 33.79 kg/m.  General Appearance: Disheveled  Eye Contact:  Fair  Speech:  Slow  Volume:  Decreased  Mood:  Depressed, Dysphoric and confused  Affect:  Congruent, Depressed and Flat  Thought Process:  Disorganized and Descriptions of Associations: Tangential  Orientation:  Other:  unable to assess due to active psychosis  Thought Content:  Illogical and Tangential  Suicidal Thoughts:  unable to assess   Homicidal Thoughts:  unable to assess  Memory:  NA  Judgement:  Impaired  Insight:  Lacking  Psychomotor Activity:  Decreased  Concentration:  Concentration: Poor and Attention Span: Poor  Recall:  unable to assess  Fund of Knowledge:  unable to assess d/t active psychosis  Language:  Fair  Akathisia:  Negative  Handed:  Right  AIMS (if indicated):     Assets:  Housing  ADL's:  Impaired  Cognition:  Impaired,  Moderate  Sleep:   <6 hours   Treatment Plan Summary:  Patient is disorganized, tangential but more talkative and interactive today. Reviewed nursing notes which state patient was rambling, flight of ideas and confused.  He is currently taking gabapenitn 300mg  BID, will increase this to 400mg  BID.  He He continues to meet inpatient criteria; SW actively seeking placement.  In the interim, we will continue with medications as outlined below for mood stabilization while awaiting placement.     Medications Continue with the following medications: Brexpiprazole 2mg  po daily for schizophrenia  Increase the following medications: Gabapentin to 400mg  BID for aggressive behaviors  Mallie Darting, NP 01/28/2020, 4:48 PM   Patient seen face-to-face for psychiatric evaluation, chart reviewed and case discussed with the physician extender and developed treatment plan. Reviewed the information documented and agree with the treatment plan. Corena Pilgrim, MD

## 2020-01-28 NOTE — ED Notes (Signed)
Non stop talking to self. Writer engaged him when gave him his HS meds. Difficult to keep him on task. 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 He then reports how late he stayed up recently and what TV show he was watching. His o2 was off, reapplied at 2 liters. He was re positioned in bed and given a 2nd blanket. He took his HS pill which was Neurontin. He is confused, but has been calm and no behavior issue. Will continue to monitor for safety. Tech at doorway.

## 2020-01-28 NOTE — Progress Notes (Signed)
Pt denied placement at [facility] due amount of O2 required.   Darletta Moll MSW, [place] Worker Disposition  [facility] Ph: [phone] Fax: [phone]

## 2020-01-29 ENCOUNTER — Encounter (HOSPITAL_COMMUNITY): Payer: Self-pay | Admitting: Registered Nurse

## 2020-01-29 DIAGNOSIS — F209 Schizophrenia, unspecified: Secondary | ICD-10-CM | POA: Diagnosis not present

## 2020-01-29 LAB — RESPIRATORY PANEL BY RT PCR (FLU A&B, COVID)
Influenza A by PCR: NEGATIVE
Influenza B by PCR: NEGATIVE
SARS Coronavirus 2 by RT PCR: NEGATIVE

## 2020-01-29 MED ORDER — GABAPENTIN 400 MG PO CAPS: 400.0000 mg | ORAL_CAPSULE | Freq: Two times a day (BID) | ORAL | 0 refills | Status: DC

## 2020-01-29 MED ORDER — BREXPIPRAZOLE 2 MG PO TABS: 2.0000 mg | ORAL_TABLET | Freq: Every day | ORAL | 0 refills | Status: DC
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
## 2020-01-29 NOTE — ED Notes (Signed)
Ambulated to the bathroom with aide of his walker without difficulty. Had some gas and felt like he needed to have a bowel movement but unable to. Did urinate.

## 2020-01-29 NOTE — BH Assessment (Addendum)
[facility] Assessment Progress Note  Per Shuvon Rankin, FNP, this pt does not require psychiatric hospitalization at this time.  Pt is to be discharged from [facility] with recommendation to resume treatment with [facility], where pt has received medication management services in the past.  This has been included in pt's discharge instructions.  A social work consult has been ordered to facilitate pt's return to his residential facility.  Pt's nurse, Eustaquio Maize, has been notified.  Jalene Mullet, MA Triage Specialist [phone]   Addendum:  At 11:57 this writer called pt's guardian through [employer] [phone]), to notify her of pt's disposition.  She reports that pt has not been seen by [facility] for years, but she is uncertain of pt's current provider.  Discharge instructions have been modified, advising pt to follow up with his current unspecified outpatient provider.  Eustaquio Maize has been notified.  Jalene Mullet, [place] Coordinator [phone]

## 2020-01-29 NOTE — Progress Notes (Addendum)
Received Barbaraann Rondo at the change of shift awake in his room talking out loud in a normal tone. [facility]AR arrived top transport him to [facility] via stretcher. His personal belonging was sent with him. No acute distress noted.

## 2020-01-29 NOTE — ED Provider Notes (Signed)
  Physical Exam  BP 126/64 (BP Location: Left Arm)   Pulse 90   Temp 98.5 F (36.9 C) (Oral)   Resp 18   Ht 1.829 m (6')   Wt 113 kg   SpO2 96%   BMI 33.79 kg/m   Physical Exam  69 year old male appears older than stated age sitting in bed with oxygen in place he is conversant.   ED Course/Procedures   Clinical Course as of Jan 28 746  Sat Jan 27, 2020  0210 Nurse discussed with facility and apparently patient made suicidal and homicidal threats in their presence. Will add on cmp and consult TTS.    [JM]    Clinical Course User Index [JM] Mesner, Corene Cornea, MD    Procedures    69 year old male with multiple health problems including COPD, recent Covid infection and is oxygen dependent here with reports that he had suicidal and homicidal ideation at his assisted living care facility. Psych consult with increase of gabapentin from 300 twice daily to 400 twice daily yesterday.  They report that he needs inpatient criterion and social work is actually seeking placement   Pattricia Boss, MD 01/30/20 [phone]

## 2020-01-29 NOTE — ED Notes (Signed)
Pt alert. Pt confused, disorganized, rambling. Pt cooperative with care. Pt able to feed self after set up.

## 2020-01-29 NOTE — Progress Notes (Signed)
2nd shift ED CSW received a handoff from the 1st shift [facility] ED CSW.   CSW received pt's blue hard copies of scripts, updated Juliann Pulse at [facility] wo requested pt's scripts be sent with the pt.  CSW confirmed [facility] had been called and that pt is 4th in line updated Juliann Pulse as well.  Per RN, report has been called.  Please reconsult if future social work needs arise.  CSW signing off, as social work intervention is no longer needed.  Alphonse Guild. Deke Tilghman, LCSW, LCAS, CSI Transitions of Care Clinical Social Worker Care Coordination Department Ph: [phone]

## 2020-01-29 NOTE — Progress Notes (Signed)
CSW spoke with Juliann Pulse at [facility]. Patient will be going to Rm 118. Number for reports is [phone]. Patient can be transported via [place]. RN and guardian aware.   PATIENT CAN BE DISCHARGED TO [facility] AFTER COVID RESULTS COME BACK.  Golden Circle, LCSW Transitions of Care Department [facility] ED [phone]

## 2020-01-29 NOTE — Discharge Instructions (Signed)
For your behavioral health needs, you are advised to resume treatment with [facility]:       [facility]      [address] [place], [place] [address]      [phone]

## 2020-01-29 NOTE — Progress Notes (Addendum)
12:56p CSW spoke with patient's legal guardian Jari Favre who reports she is agreeable to patient returning to [facility]. CSW awaiting call back from [facility] to see if patient can return.  10:53a TOC consult received regarding patient needing assistance with returning back to [facility]. CSW attempted to contact Juliann Pulse to discuss patient returning, but did not receive an answer. CSW awaiting a call back. Will continue to follow up.   Golden Circle, LCSW Transitions of Care Department [facility] ED [phone]

## 2020-01-29 NOTE — Consult Note (Signed)
[facility] ED Discharge  01/29/2020 3:18 PM [name]  MRN:  [id number] Principal Problem: Schizophrenia [facility]) Discharge Diagnoses: Principal Problem:   Schizophrenia ([place])   Subjective: [name], 69 y.o., male patient seen via tele psych by this provider, consulted with Dr. Mallie Darting; and chart reviewed on 01/29/20.  On evaluation [name] reports he is feeling better just sleepy.  Patient reports that he stopped taking his medication because "They made me feel doped up and I could watch TV"  Patient had been off of medications for 4 days prior to aggressive behavior.  Patient states that he is feeling better.  At this time patient denies suicidal/self-injury/homicidal ideation, psychosis, and paranoia.  Medication changes have been made and patient reports he is toleration medication without any adverse effects.  During evaluation [name] is alert/oriented x 4; calm/cooperative; and mood is congruent with affect.  He does not appear to be responding to internal/external stimuli or delusional thoughts.  Patient denies suicidal/self-harm/homicidal ideation, psychosis, and paranoia.  Patient answered question appropriately.     Total Time spent with patient: 30 minutes  Past Psychiatric History: See below  Past Medical History:  Past Medical History:  Diagnosis Date  . Cellulitis   . CHF (congestive heart failure) ([place])   . Chronic kidney disease 09/20/2008  . Chronic lymphocytic leukemia ([place])   . cll dx'd 10/2003   no rx  . CLL (chronic lymphoblastic leukemia) 10/2003  . COPD (chronic obstructive pulmonary disease) ([place])   . GERD (gastroesophageal reflux disease)   . Hiatal hernia   . Hyperlipemia   . Hypertension   . Lower extremity edema   . NHL (non-Hodgkin's lymphoma) ([place]) dx'd 08/2008 rt groin   chemo comp 08/2008  . Non Hodgkin's lymphoma ([place]) 08/2008  . Non Hodgkin's lymphoma ([place])   . Paranoid schizophrenia ([place])   . Prolonged Q-T interval on  ECG   . RBBB 09/17/2017  . Schizoaffective disorder ([place])   . Sepsis ([place])   . Severe left ventricular hypertrophy 10/21/2017  . Vitamin D deficiency     Past Surgical History:  Procedure Laterality Date  . LYMPH NODE BIOPSY     Family History:  Family History  Problem Relation Age of Onset  . Alcohol abuse Father    Family Psychiatric  History: Unknown Social History:  Social History   Substance and Sexual Activity  Alcohol Use No     Social History   Substance and Sexual Activity  Drug Use No    Social History   Socioeconomic History  . Marital status: Single    Spouse name: Not on file  . Number of children: Not on file  . Years of education: Not on file  . Highest education level: Not on file  Occupational History  . Not on file  Tobacco Use  . Smoking status: Never Smoker  . Smokeless tobacco: Never Used  Substance and Sexual Activity  . Alcohol use: No  . Drug use: No  . Sexual activity: Never  Other Topics Concern  . Not on file  Social History Narrative  . Not on file   Social Determinants of Health   Financial Resource Strain:   . Difficulty of Paying Living Expenses:   Food Insecurity:   . Worried About Charity fundraiser in the Last Year:   . Arboriculturist in the Last Year:   Transportation Needs:   . Film/video editor (Medical):   Marland Kitchen Lack  of Transportation (Non-Medical):   Physical Activity:   . Days of Exercise per Week:   . Minutes of Exercise per Session:   Stress:   . Feeling of Stress :   Social Connections:   . Frequency of Communication with Friends and Family:   . Frequency of Social Gatherings with Friends and Family:   . Attends Religious Services:   . Active Member of Clubs or Organizations:   . Attends Archivist Meetings:   Marland Kitchen Marital Status:     Has this patient used any form of tobacco in the last 30 days? (Cigarettes, Smokeless Tobacco, Cigars, and/or Pipes) Prescription not provided because: Does not  use tobacco products  Current Medications: Current Facility-Administered Medications  Medication Dose Route Frequency Provider Last Rate Last Admin  . brexpiprazole (REXULTI) tablet 2 mg  2 mg Oral Daily Akintayo, Mojeed, MD   2 mg at 01/29/20 1104  . gabapentin (NEURONTIN) capsule 400 mg  400 mg Oral BID Corena Pilgrim, MD   400 mg at 01/29/20 1104   Current Outpatient Medications  Medication Sig Dispense Refill  . acetaminophen (TYLENOL) 500 MG tablet Take 1,000 mg by mouth every 6 (six) hours as needed for mild pain.    Marland Kitchen ascorbic acid (VITAMIN C) 500 MG tablet Take 1 tablet (500 mg total) by mouth daily.    Marland Kitchen aspirin EC 81 MG tablet Take 81 mg by mouth daily.    Marland Kitchen atorvastatin (LIPITOR) 20 MG tablet Take 1 tablet (20 mg total) by mouth daily. 30 tablet 5  . docusate sodium (COLACE) 100 MG capsule Take 100 mg by mouth daily.    . fluticasone (FLOVENT HFA) 220 MCG/ACT inhaler Inhale 1 puff into the lungs 2 (two) times daily. 1 Inhaler 12  . Ipratropium-Albuterol (COMBIVENT) 20-100 MCG/ACT AERS respimat Inhale 1 puff into the lungs every 6 (six) hours as needed for wheezing.    Marland Kitchen levothyroxine (SYNTHROID, LEVOTHROID) 100 MCG tablet Take 100 mcg daily by mouth.     . Multiple Vitamin (MULTIVITAMIN WITH MINERALS) TABS tablet Take 1 tablet by mouth daily.    . Omega-3 Fatty Acids (FISH OIL) 1000 MG CAPS Take 4,000 mg by mouth daily.    . polyethylene glycol (MIRALAX / GLYCOLAX) packet Take 17 g daily by mouth. Dissolve 17g in 8oz of water/juice and drink by mouth every day    . thiamine 100 MG tablet Take 1 tablet (100 mg total) by mouth daily.    Marland Kitchen zinc sulfate 220 (50 Zn) MG capsule Take 1 capsule (220 mg total) by mouth daily.    Derrill Memo ON 01/30/2020] brexpiprazole (REXULTI) 2 MG TABS tablet Take 1 tablet (2 mg total) by mouth daily. 30 tablet 0  . gabapentin (NEURONTIN) 400 MG capsule Take 1 capsule (400 mg total) by mouth 2 (two) times daily. 60 capsule 0   PTA Medications: (Not in a  hospital admission)   Musculoskeletal: Strength & Muscle Tone: Unable to assess via telepshyc Gait & Station: Did not see patient ambulate Patient leans: N/A  Psychiatric Specialty Exam: Physical Exam  Physical Exam Vitals and nursing note reviewed. Exam conducted with a chaperone present (Sitter at bedside).  Pulmonary:     Effort: Pulmonary effort is normal.  Neurological:     Mental Status: He is alert.  Psychiatric:        Attention and Perception: Attention and perception normal.        Speech: Speech normal.  Behavior: Behavior is agitated (Related to being awaken for assessment). Behavior is cooperative.        Thought Content: Thought content normal. Thought content is not paranoid or delusional. Thought content does not include homicidal or suicidal ideation.        Cognition and Memory: Cognition normal. Memory is impaired (Baseline).        Judgment: Judgment is impulsive.    Review of Systems  Psychiatric/Behavioral: Negative for hallucinations, self-injury, sleep disturbance and suicidal ideas.       Patient stating he wants to go back to sleep   All other systems reviewed and are negative.   Blood pressure 114/60, pulse 88, temperature 98.4 F (36.9 C), temperature source Oral, resp. rate 18, height 6' (1.829 m), weight 113 kg, SpO2 95 %.Body mass index is 33.79 kg/m.  General Appearance: Casual  Eye Contact:  Good  Speech:  Clear and Coherent and Normal Rate  Volume:  Normal  Mood:  "Better just sleepy"  Affect:  Appropriate and Congruent  Thought Process:  Coherent, Goal Directed and Linear  Orientation:  Full (Time, Place, and Person)  Thought Content:  WDL  Suicidal Thoughts:  No  Homicidal Thoughts:  No  Memory:  Immediate;   Fair Recent;   Fair  Judgement:  Intact  Insight:  Present  Psychomotor Activity:  Normal  Concentration:  Concentration: Fair and Attention Span: Fair  Recall:  [employer] of Knowledge:  Fair  Language:  Fair   Akathisia:  No  Handed:  Right  AIMS (if indicated):     Assets:  Communication Skills Desire for Improvement Housing Social Support  ADL's:  Intact  Cognition:  Impaired,  Mild  Sleep:        Demographic Factors:  Male, Age 40 or older and Caucasian  Loss Factors: NA  Historical Factors: Impulsivity  Risk Reduction Factors:   Religious beliefs about death, Living with another person, especially a relative and Positive social support  Continued Clinical Symptoms:  Previous Psychiatric Diagnoses and Treatments  Cognitive Features That Contribute To Risk:  None    Suicide Risk:  Minimal: No identifiable suicidal ideation.  Patients presenting with no risk factors but with morbid ruminations; may be classified as minimal risk based on the severity of the depressive symptoms    Plan Of Care/Follow-up recommendations:  Activity:  As tolerated Diet:  Heart healthy    Discharge Instructions     For your behavioral health needs, you are advised to resume treatment with [facility]:       [facility]      [address] [place], [place] [address]      [phone]      Disposition:Psychiatrically cleared No evidence of imminent risk to self or others at present.   Patient does not meet criteria for psychiatric inpatient admission. Supportive therapy provided about ongoing stressors. Discussed crisis plan, support from social network, calling 911, coming to the Emergency Department, and calling Suicide Hotline.   Hiilei Gerst, NP 01/29/2020, 3:18 PM

## 2020-01-29 NOTE — ED Notes (Signed)
Awake and again non stop talking to self. She has slept for short periods of time tonight, and only a few times. He has been awake the bigger part of the shift.

## 2020-01-29 NOTE — ED Notes (Signed)
Awake and talking to self, telling himself a story about ducks in a cove. When writer went into his room to tell him what time it is, others asleep and to please be quiet it was not time to talk he did quiet down and is resting quietly now.

## 2021-03-12 IMAGING — DX DG CHEST 1V PORT
1 series · 1 of 1 positions shown · non-contrast
Comparison: 12/26/2018

CLINICAL DATA: Altered level of consciousness.

EXAM:
PORTABLE CHEST 1 VIEW

[chest ap]
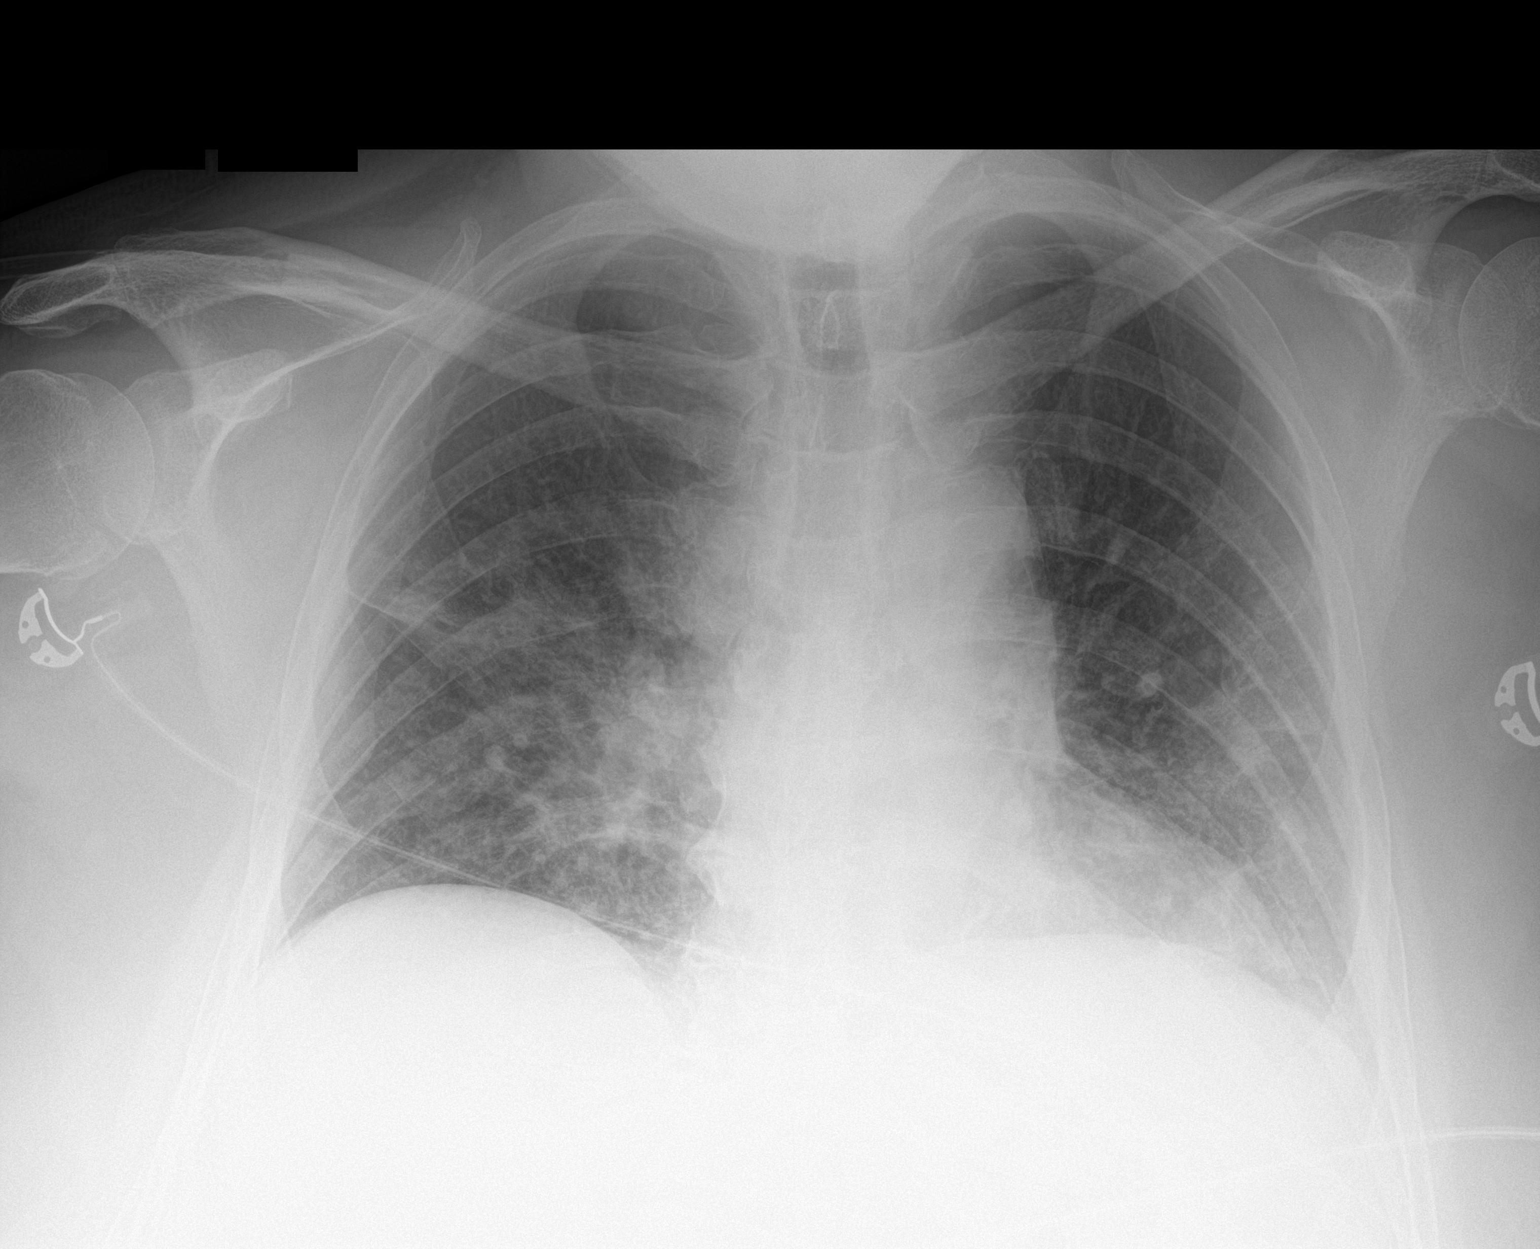

[1 of 1 positions shown; findings below may reference images not displayed]

FINDINGS: Normal heart size. No pleural effusion or edema identified.
Bilateral airspace opacities are identified involving both mid and
lower lung zones.
IMPRESSION: 1. Bilateral airspace opacities are identified concerning for
multifocal infection.

## 2023-11-24 NOTE — Progress Notes (Signed)
 Referral received from patients SNF. Discussed with Dr Federico, normally we do not see patients this far out, patient was diagnosed in 2009. Patient has history of Non-Hodgkin's lymphoma and CLL. Saw Dr Lonn in 2016 and was not having any symptoms and was discharged. Dr Federico asked that we ask Dr Lonn if she would like to take this referral since she saw patient in 2016. Will continue to monitor.

## 2023-12-20 ENCOUNTER — Encounter: Payer: Self-pay | Admitting: Hematology and Oncology

## 2023-12-20 ENCOUNTER — Inpatient Hospital Stay: Payer: Medicare Other

## 2023-12-20 ENCOUNTER — Inpatient Hospital Stay: Payer: Medicare Other | Attending: Hematology and Oncology | Admitting: Hematology and Oncology

## 2023-12-20 VITALS — BP 120/79 | HR 104 | Temp 99.9°F | Resp 18 | Wt 286.4 lb

## 2023-12-20 DIAGNOSIS — Z8572 Personal history of non-Hodgkin lymphomas: Secondary | ICD-10-CM | POA: Diagnosis present

## 2023-12-20 DIAGNOSIS — R4189 Other symptoms and signs involving cognitive functions and awareness: Secondary | ICD-10-CM | POA: Diagnosis present

## 2023-12-20 NOTE — Assessment & Plan Note (Signed)
The patient has cognitive deficits due to his mental health illness He is able to answer simple questions but does not have mental capacity to make informed decisions I do not recommend surveillance blood work or imaging study for his condition If the patient had recurrent cancer that was found through other ways, he is not a candidate for treatment and should be referred to palliative care with hospice

## 2023-12-20 NOTE — Assessment & Plan Note (Signed)
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 He received chemotherapy with a R-CHOP with complete remission. PET imaging from 2010 showed complete response His last imaging study was in 2013 which showed no evidence of disease. The patient was hospitalized in 2020 for sepsis and CT imaging of the abdomen and pelvis showed no evidence of disease I spoke with the caregiver and discussed future plan of care There is no role for continuous monitoring or follow-up. The patient has achieved remission since 2010 There is no role for surveillance follow-up or imaging He does not need to come back His examination today is benign with no signs indicative of disease recurrence

## 2023-12-20 NOTE — Progress Notes (Signed)
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 He received chemotherapy with a R-CHOP with complete remission. PET imaging from 2010 showed complete response His last imaging study was in 2013 which showed no evidence of disease. The patient was hospitalized in 2020 for sepsis and CT imaging of the abdomen and pelvis showed no evidence of disease I spoke with the caregiver and discussed future plan of care There is no role for continuous monitoring or follow-up. The patient has achieved remission since 2010 There is no role for surveillance follow-up or imaging He does not need to come back His examination today is benign with no signs indicative of disease recurrence  Cognitive deficits The patient has cognitive deficits due to his mental health illness He is able to answer simple questions but does not have mental capacity to make informed decisions I do not recommend surveillance blood work or imaging study for his condition If the patient had recurrent cancer that was found through other ways, he is not a candidate for treatment and should be referred to palliative care with hospice  All questions were answered. The patient knows to call the clinic with any problems, questions or concerns. The total time spent in the appointment was 55 minutes encounter with patients including review of chart and various tests results, discussions about plan of care and coordination of care plan   Artis Delay, MD 12/20/2023 11:28 AM    CHIEF COMPLAINTS/PURPOSE OF VISIT:  History of lymphoma  HISTORY OF PRESENTING ILLNESS:  [name] 73 y.o. 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 He received chemotherapy with a R-CHOP with complete remission. PET imaging from 2010 showed complete response His last imaging study was in 2013 which showed no evidence of disease. The patient has significant multiple comorbidities.  I have updated his past medical, past surgical history as well as his medication list today  MEDICAL HISTORY:  Past Medical History:  Diagnosis Date   Anxiety    Cellulitis    CHF (congestive heart failure) (H[facility])    Chronic kidney disease 09/20/2008   Chronic lymphocytic leukemia (HCC)    cll dx'd 10/2003   no rx   CLL (chronic lymphoblastic leukemia) 10/2003   Cognitive deficits    COPD (chronic obstructive pulmonary disease) (H[facility])    Depression    Generalized weakness    GERD (gastroesophageal reflux disease)    Hiatal hernia    Hyperlipemia    Hypertension    Hypothyroidism    Lower extremity edema    NHL (non-Hodgkin's lymphoma) (HCC) dx'd 08/2008 rt groin   chemo comp 08/2008   Non Hodgkin's lymphoma (HCC) 08/2008   Non Hodgkin's lymphoma (HCC)    Obesity, Class III, BMI 40-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) (HCC)    Obstructive uropathy    Paranoid schizophrenia (HCC)    Prolonged Q-T interval on ECG    RBBB 09/17/2017   Schizoaffective disorder (HCC)    Sepsis (HCC)    Severe left ventricular hypertrophy  10/21/2017   Vitamin D deficiency     SURGICAL HISTORY: Past Surgical History:  Procedure Laterality Date   LYMPH NODE BIOPSY      SOCIAL HISTORY: Social History   Socioeconomic History   Marital status: Single    Spouse name: Not on file   Number of children: Not on file   Years of education: Not on file   Highest education level: Not on file  Occupational History   Not on file  Tobacco Use   Smoking status:  Never   Smokeless tobacco: Never  Substance and Sexual Activity   Alcohol use: No   Drug use: No   Sexual activity: Never  Other Topics Concern   Not on file  Social History Narrative   Not on file   Social Drivers of Health   Financial Resource Strain: Not on file  Food Insecurity: Not on file  Transportation Needs: Not on file  Physical Activity: Not on file  Stress: Not on file  Social Connections: Not on file  Intimate Partner Violence: Not on file    FAMILY HISTORY: Family History  Problem Relation Age of Onset   Alcohol abuse Father     ALLERGIES:  is allergic to sulfamethoxazole.  MEDICATIONS:  Current Outpatient Medications  Medication Sig Dispense Refill   beclomethasone (QVAR REDIHALER) 80 MCG/ACT inhaler Inhale 1 puff into the lungs 2 (two) times daily.     BENZTROPINE MESYLATE PO Take 0.5 mg by mouth 2 (two) times daily.     clonazePAM (KLONOPIN) 0.5 MG tablet Take 0.5 mg by mouth at bedtime.     clozapine (CLOZARIL) 50 MG tablet Take 150 mg by mouth 2 (two) times daily.     Emollient (EUCERIN) lotion Apply 1 Application topically as needed for dry skin.     escitalopram (LEXAPRO) 20 MG tablet Take 20 mg by mouth daily.     famotidine (PEPCID) 20 MG tablet Take 20 mg by mouth 2 (two) times daily.     Menthol, Topical Analgesic, (BIOFREEZE COOL THE PAIN) 4 % GEL Apply 1 Application topically 2 (two) times daily.     nitroGLYCERIN (NITROSTAT) 0.4 MG SL tablet Place 0.4 mg under the tongue every 5 (five) minutes as needed for chest pain.     sennosides-docusate sodium (SENOKOT-S) 8.6-50 MG tablet Take 1 tablet by mouth daily.     traZODone (DESYREL) 50 MG tablet Take 50 mg by mouth at bedtime.     aspirin EC 81 MG tablet Take 81 mg by mouth daily.     levothyroxine (SYNTHROID, LEVOTHROID) 100 MCG tablet Take 100 mcg daily by mouth.      polyethylene glycol (MIRALAX / GLYCOLAX) packet Take 17 g daily by mouth. Dissolve 17g in 8oz of water/juice and drink by mouth  every day     No current facility-administered medications for this visit.    REVIEW OF SYSTEMS: Unable to obtain  PHYSICAL EXAMINATION: ECOG PERFORMANCE STATUS: 3 - Symptomatic, >50% confined to bed  Vitals:   12/20/23 1045  BP: 120/79  Pulse: (!) 104  Resp: 18  Temp: 99.9 F (37.7 C)  SpO2: 93%   Filed Weights   12/20/23 1045  Weight: 286 lb 6.4 oz (129.9 kg)    GENERAL:alert, no distress and comfortable, limited examination as the patient is examined on his wheelchair.  He has significant central obesity SKIN: skin color, texture, turgor are normal, no rashes or significant lesions EYES: normal, conjunctiva are pink and non-injected, sclera clear OROPHARYNX:no  exudate, normal lips, buccal mucosa, and tongue.  Poor dentition NECK: supple, thyroid normal size, non-tender, without nodularity LYMPH:  no palpable lymphadenopathy in the cervical, axillary or inguinal LUNGS: clear to auscultation and percussion with normal breathing effort HEART: regular rate & rhythm and no murmurs without lower extremity edema ABDOMEN:abdomen soft, non-tender and normal bowel sounds Musculoskeletal:no cyanosis of digits and no clubbing  PSYCH: alert, able to answer simple questions NEURO: Unable to fully evaluate.  He is able to move all his limbs  LABORATORY DATA:  I have reviewed the data as listed Lab Results  Component Value Date   WBC 13.0 (H) 01/27/2020   HGB 12.8 (L) 01/27/2020   HCT 41.3 01/27/2020   MCV 97.4 01/27/2020   PLT 157 01/27/2020   No results for input(s): "NA", "K", "CL", "CO2", "GLUCOSE", "BUN", "CREATININE", "CALCIUM", "GFRNONAA", "GFRAA", "PROT", "ALBUMIN", "AST", "ALT", "ALKPHOS", "BILITOT", "BILIDIR", "IBILI" in the last 8760 hours.   RADIOGRAPHIC STUDIES: I reviewed his last imaging from 2020 I have personally reviewed the radiological images as listed and agreed with the findings in the report.

## 2023-12-24 NOTE — Progress Notes (Signed)
 Patient appointment with Dr Marton Sleeper who reported patient does not need any addition follow up visits as has been cancer free for greater than 10 years.
